# Patient Record
Sex: Male | Born: 1960
Health system: Southern US, Community
[De-identification: ages and names within clinical notes are randomized; demographics above are authoritative.]

## PROBLEM LIST (undated history)

## (undated) DIAGNOSIS — Z8709 Personal history of other diseases of the respiratory system: Secondary | ICD-10-CM

## (undated) DIAGNOSIS — F329 Major depressive disorder, single episode, unspecified: Secondary | ICD-10-CM

## (undated) DIAGNOSIS — Z9889 Other specified postprocedural states: Secondary | ICD-10-CM

## (undated) DIAGNOSIS — M1990 Unspecified osteoarthritis, unspecified site: Secondary | ICD-10-CM

## (undated) DIAGNOSIS — IMO0001 Reserved for inherently not codable concepts without codable children: Secondary | ICD-10-CM

## (undated) DIAGNOSIS — T7840XA Allergy, unspecified, initial encounter: Secondary | ICD-10-CM

## (undated) DIAGNOSIS — M549 Dorsalgia, unspecified: Secondary | ICD-10-CM

## (undated) DIAGNOSIS — K37 Unspecified appendicitis: Secondary | ICD-10-CM

## (undated) DIAGNOSIS — G4730 Sleep apnea, unspecified: Secondary | ICD-10-CM

## (undated) DIAGNOSIS — R112 Nausea with vomiting, unspecified: Secondary | ICD-10-CM

## (undated) DIAGNOSIS — I472 Ventricular tachycardia, unspecified: Secondary | ICD-10-CM

## (undated) DIAGNOSIS — E785 Hyperlipidemia, unspecified: Secondary | ICD-10-CM

## (undated) DIAGNOSIS — G629 Polyneuropathy, unspecified: Secondary | ICD-10-CM

## (undated) DIAGNOSIS — E119 Type 2 diabetes mellitus without complications: Secondary | ICD-10-CM

## (undated) DIAGNOSIS — R519 Headache, unspecified: Secondary | ICD-10-CM

## (undated) DIAGNOSIS — I2510 Atherosclerotic heart disease of native coronary artery without angina pectoris: Secondary | ICD-10-CM

## (undated) DIAGNOSIS — Z95810 Presence of automatic (implantable) cardiac defibrillator: Secondary | ICD-10-CM

## (undated) DIAGNOSIS — Z950 Presence of cardiac pacemaker: Secondary | ICD-10-CM

## (undated) DIAGNOSIS — Z8719 Personal history of other diseases of the digestive system: Secondary | ICD-10-CM

## (undated) DIAGNOSIS — R51 Headache: Secondary | ICD-10-CM

## (undated) DIAGNOSIS — Z860100 Personal history of colon polyps, unspecified: Secondary | ICD-10-CM

## (undated) DIAGNOSIS — F32A Depression, unspecified: Secondary | ICD-10-CM

## (undated) DIAGNOSIS — I509 Heart failure, unspecified: Secondary | ICD-10-CM

## (undated) DIAGNOSIS — I219 Acute myocardial infarction, unspecified: Secondary | ICD-10-CM

## (undated) DIAGNOSIS — I10 Essential (primary) hypertension: Secondary | ICD-10-CM

## (undated) DIAGNOSIS — I429 Cardiomyopathy, unspecified: Secondary | ICD-10-CM

## (undated) DIAGNOSIS — J189 Pneumonia, unspecified organism: Secondary | ICD-10-CM

## (undated) DIAGNOSIS — K219 Gastro-esophageal reflux disease without esophagitis: Secondary | ICD-10-CM

## (undated) DIAGNOSIS — F419 Anxiety disorder, unspecified: Secondary | ICD-10-CM

## (undated) DIAGNOSIS — E669 Obesity, unspecified: Secondary | ICD-10-CM

## (undated) HISTORY — DX: Cardiomyopathy, unspecified: I42.9

## (undated) HISTORY — PX: BACK SURGERY: SHX140

## (undated) HISTORY — DX: Sleep apnea, unspecified: G47.30

## (undated) HISTORY — PX: VASECTOMY: SHX75

## (undated) HISTORY — PX: COLONOSCOPY: SHX174

## (undated) HISTORY — DX: Presence of automatic (implantable) cardiac defibrillator: Z95.810

## (undated) HISTORY — DX: Allergy, unspecified, initial encounter: T78.40XA

## (undated) HISTORY — DX: Major depressive disorder, single episode, unspecified: F32.9

## (undated) HISTORY — PX: KNEE SURGERY: SHX244

## (undated) HISTORY — DX: Gastro-esophageal reflux disease without esophagitis: K21.9

## (undated) HISTORY — DX: Acute myocardial infarction, unspecified: I21.9

## (undated) HISTORY — PX: CARDIAC CATHETERIZATION: SHX172

## (undated) HISTORY — PX: CARDIAC ELECTROPHYSIOLOGY STUDY AND ABLATION: SHX1294

## (undated) HISTORY — PX: ESOPHAGOGASTRODUODENOSCOPY: SHX1529

## (undated) HISTORY — PX: CORONARY ANGIOPLASTY WITH STENT PLACEMENT: SHX49

## (undated) HISTORY — DX: Heart failure, unspecified: I50.9

## (undated) HISTORY — PX: JOINT REPLACEMENT: SHX530

## (undated) HISTORY — PX: EYE SURGERY: SHX253

## (undated) HISTORY — DX: Essential (primary) hypertension: I10

## (undated) HISTORY — DX: Depression, unspecified: F32.A

## (undated) HISTORY — PX: SPINE SURGERY: SHX786

## (undated) HISTORY — DX: Hyperlipidemia, unspecified: E78.5

## (undated) HISTORY — DX: Obesity, unspecified: E66.9

## (undated) HISTORY — DX: Atherosclerotic heart disease of native coronary artery without angina pectoris: I25.10

## (undated) HISTORY — PX: APPENDECTOMY: SHX54

## (undated) HISTORY — DX: Unspecified appendicitis: K37

## (undated) HISTORY — PX: INSERT / REPLACE / REMOVE PACEMAKER: SUR710

## (undated) HISTORY — DX: Type 2 diabetes mellitus without complications: E11.9

## (undated) HISTORY — DX: Ventricular tachycardia: I47.2

## (undated) HISTORY — DX: Ventricular tachycardia, unspecified: I47.20

---

## 1999-09-18 DIAGNOSIS — I219 Acute myocardial infarction, unspecified: Secondary | ICD-10-CM

## 1999-09-18 HISTORY — PX: CHOLECYSTECTOMY: SHX55

## 1999-09-18 HISTORY — DX: Acute myocardial infarction, unspecified: I21.9

## 2007-10-16 HISTORY — PX: CARDIAC DEFIBRILLATOR PLACEMENT: SHX171

## 2013-07-24 LAB — HM COLONOSCOPY

## 2013-08-10 ENCOUNTER — Encounter: Payer: Self-pay | Admitting: Primary Care

## 2014-09-30 DIAGNOSIS — E785 Hyperlipidemia, unspecified: Secondary | ICD-10-CM | POA: Insufficient documentation

## 2014-09-30 DIAGNOSIS — I5022 Chronic systolic (congestive) heart failure: Secondary | ICD-10-CM | POA: Insufficient documentation

## 2015-04-20 ENCOUNTER — Encounter: Payer: Self-pay | Admitting: Cardiovascular Disease

## 2015-04-20 ENCOUNTER — Ambulatory Visit (INDEPENDENT_AMBULATORY_CARE_PROVIDER_SITE_OTHER): Payer: Managed Care, Other (non HMO) | Admitting: Cardiovascular Disease

## 2015-04-20 ENCOUNTER — Encounter (INDEPENDENT_AMBULATORY_CARE_PROVIDER_SITE_OTHER): Payer: Self-pay

## 2015-04-20 VITALS — BP 100/62 | HR 66 | Ht 72.0 in | Wt 289.8 lb

## 2015-04-20 DIAGNOSIS — R079 Chest pain, unspecified: Secondary | ICD-10-CM | POA: Diagnosis not present

## 2015-04-20 DIAGNOSIS — I2510 Atherosclerotic heart disease of native coronary artery without angina pectoris: Secondary | ICD-10-CM

## 2015-04-20 DIAGNOSIS — R0602 Shortness of breath: Secondary | ICD-10-CM

## 2015-04-20 DIAGNOSIS — E1165 Type 2 diabetes mellitus with hyperglycemia: Secondary | ICD-10-CM | POA: Insufficient documentation

## 2015-04-20 DIAGNOSIS — E118 Type 2 diabetes mellitus with unspecified complications: Secondary | ICD-10-CM

## 2015-04-20 DIAGNOSIS — Z95810 Presence of automatic (implantable) cardiac defibrillator: Secondary | ICD-10-CM | POA: Insufficient documentation

## 2015-04-20 DIAGNOSIS — IMO0002 Reserved for concepts with insufficient information to code with codable children: Secondary | ICD-10-CM

## 2015-04-20 DIAGNOSIS — I420 Dilated cardiomyopathy: Secondary | ICD-10-CM | POA: Insufficient documentation

## 2015-04-20 MED ORDER — SACUBITRIL-VALSARTAN 24-26 MG PO TABS: 1.0000 | ORAL_TABLET | Freq: Two times a day (BID) | ORAL | Status: DC

## 2015-04-20 NOTE — Assessment & Plan Note (Signed)
Long discussion today concerning his history. He has fatigue, some shortness of breath. Unclear what is chronic and new. Long history of chronic systolic CHF. Recommended restricting his diet, salt and fluid intake Extra Lasix as above. Ideally needs weight loss, exercise regimen

## 2015-04-20 NOTE — Assessment & Plan Note (Signed)
We will arrange follow-up/new appointment with Dr. Caryl Comes, EP. Establish care History of VT, VT ablation, cardiomyopathy

## 2015-04-20 NOTE — Assessment & Plan Note (Signed)
Poorly controlled diabetes, he has follow-up with Dr. Gabriel Carina.  Suspect dietary indiscretion

## 2015-04-20 NOTE — Progress Notes (Addendum)
Patient ID: Brian Williams, male    DOB: August 24, 1961, 54 y.o.   MRN: [id number]  HPI Comments: Brian Williams is a pleasant 54 year old gentleman with history of coronary artery disease, long history of smoking, diabetes type 2 poorly controlled with last hemoglobin A1c of 9, followed by endocrine/Dr. Gabriel Carina, prior history of cardiac catheterization 12 dating back to 2002, cardiomyopathy, last echocardiogram March 2016 showing ejection fraction 20-25%, who presents to establish care in the [place] office History of sustained VT, VT ablation in [place] at [facility] History of sleep apnea, on CPAP  Previously seen in [place] for his cardiac issues. History dates back to 2002 when he had distal RCA disease. Several catheterizations over the next several years for in-stent restenosis of the distal RCA. Later cath, stent placed to the LAD. Most recent catheterization appears to be April 2011 showing patent LAD and RCA stent.  Despite this, he has had a decline in his ejection fraction over the past several years requiring defibrillator for ventricular tachycardia. Arrhythmia seems to be controlled on nadolol (was previously on 80 mg twice a day, decreased down to daily secondary to low blood pressure and fatigue)  He is moved down from [place] to the [place] area March 2016. Denies having any episodes of arrhythmia or ICD shock. He's only had one shock since the device was placed.   Sounds as if he eats and drinks what he likes, takes standing dose Lasix daily, does not really check his weight for symptoms of heart failure. Does have some leg edema. Chronic shortness of breath when supine which she attributes to sleep apnea  Recently seen by [facility] cardiology heart failure/transplant team, [name]. No medication changes at that time. Creatinine was elevated with BUN, improved on subsequent lab work. Etiology, he is unclear.  Most recent cholesterol per his notes July 2015  showing total cholesterol 185, HDL 19, triglycerides 800, this was on Crestor 40, fenofibrate  EKG on today's visit shows normal sinus rhythm with rate 66 bpm, old inferior MI      Allergies  Allergen Reactions  . Naproxen Other (See Comments)    Hyperactivity     No current outpatient prescriptions on file prior to visit.   No current facility-administered medications on file prior to visit.    Past Medical History  Diagnosis Date  . Coronary artery disease   . Hypertension   . Hyperlipidemia   . CHF (congestive heart failure)   . Diabetes mellitus without complication   . Arrhythmia   . MI (myocardial infarction)   . Sleep apnea   . AICD (automatic cardioverter/defibrillator) present   . Ventricular tachycardia   . ED (erectile dysfunction)     Past Surgical History  Procedure Laterality Date  . Cardiac electrophysiology study and ablation    . Cardiac catheterization    . Coronary angioplasty with stent placement    . Knee surgery      bilateral knee   . Back surgery    . Insert / replace / remove pacemaker    . Cardiac defibrillator placement  10/16/2007    ICD Model number 2207-36 serial number [id number]    Social History  reports that he has been smoking Cigarettes.  He has been smoking about 0.50 packs per day. He does not have any smokeless tobacco history on file. He reports that he does not drink alcohol or use illicit drugs.  Family History family history includes Heart attack (age of onset: 53) in his mother;  Heart attack (age of onset: 42) in his father; Heart disease in his maternal uncle; Hypertension in his father, maternal uncle, and mother.      Review of Systems  Constitutional: Negative.   Respiratory: Positive for shortness of breath.   Cardiovascular: Positive for leg swelling.  Gastrointestinal: Negative.   Musculoskeletal: Negative.   Neurological: Negative.   Hematological: Negative.   Psychiatric/Behavioral: Negative.   All  other systems reviewed and are negative.   BP 100/62 mmHg  Pulse 66  Ht 6' (1.829 m)  Wt 289 lb 12 oz (131.43 kg)  BMI 39.29 kg/m2  Physical Exam  Constitutional: He is oriented to person, place, and time. He appears well-developed and well-nourished.  Obese  HENT:  Head: Normocephalic.  Nose: Nose normal.  Mouth/Throat: Oropharynx is clear and moist.  Eyes: Conjunctivae are normal. Pupils are equal, round, and reactive to light.  Neck: Normal range of motion. Neck supple. No JVD present.  Cardiovascular: Normal rate, regular rhythm, S1 normal, S2 normal, normal heart sounds and intact distal pulses.  Exam reveals no gallop and no friction rub.   No murmur heard. Pulmonary/Chest: Effort normal and breath sounds normal. No respiratory distress. He has no wheezes. He has no rales. He exhibits no tenderness.  Abdominal: Soft. Bowel sounds are normal. He exhibits no distension. There is no tenderness.  Musculoskeletal: Normal range of motion. He exhibits no edema or tenderness.  Lymphadenopathy:    He has no cervical adenopathy.  Neurological: He is alert and oriented to person, place, and time. Coordination normal.  Skin: Skin is warm and dry. No rash noted. No erythema.  Psychiatric: He has a normal mood and affect. His behavior is normal. Judgment and thought content normal.      Assessment and Plan   Nursing note and vitals reviewed.

## 2015-04-20 NOTE — Assessment & Plan Note (Signed)
Suspect secondary to ischemic as well as nonischemic etiology. No recent cardiac catheterization since I suspect 2011.  Recommended daily weights, extra Lasix after lunch for worsening leg edema, shortness of breath, weight gain of 3 pounds We will hold his lisinopril, start entresto 24/26 milligrams by mouth twice a day In one month's will increase the dose if tolerated. He'll monitor blood pressures daily

## 2015-04-20 NOTE — Patient Instructions (Addendum)
You are doing well.  Please hold the lisinopril Please start entresto 24/26 mg twice a day Please monitor your blood pressure After one month, if BP is ok, we will increase the dose up to 49/51 mg twice A day  We will arrange a visit with EP, Dr. Caryl Comes for ICD, h/x VT  Please call us if you have new issues that need to be addressed before your next appt.  Your physician wants you to follow-up in: 1 month

## 2015-04-20 NOTE — Assessment & Plan Note (Signed)
Prior history of stent to the LAD and numerous stents to the distal RCA. Does not feel that he needs workup for ischemia at this time. Recommended he call us for any worsening shortness of breath, arm pain, chest pain

## 2015-04-27 ENCOUNTER — Encounter: Payer: Self-pay | Admitting: Cardiovascular Disease

## 2015-05-02 ENCOUNTER — Encounter: Payer: Self-pay | Admitting: Primary Care

## 2015-05-02 ENCOUNTER — Ambulatory Visit (INDEPENDENT_AMBULATORY_CARE_PROVIDER_SITE_OTHER): Payer: Managed Care, Other (non HMO) | Admitting: Primary Care

## 2015-05-02 VITALS — BP 108/64 | HR 68 | Temp 98.7°F | Ht 72.0 in | Wt 289.0 lb

## 2015-05-02 DIAGNOSIS — E118 Type 2 diabetes mellitus with unspecified complications: Secondary | ICD-10-CM | POA: Diagnosis not present

## 2015-05-02 DIAGNOSIS — F411 Generalized anxiety disorder: Secondary | ICD-10-CM

## 2015-05-02 DIAGNOSIS — G8929 Other chronic pain: Secondary | ICD-10-CM | POA: Insufficient documentation

## 2015-05-02 DIAGNOSIS — I10 Essential (primary) hypertension: Secondary | ICD-10-CM | POA: Diagnosis not present

## 2015-05-02 DIAGNOSIS — Z95810 Presence of automatic (implantable) cardiac defibrillator: Secondary | ICD-10-CM | POA: Diagnosis not present

## 2015-05-02 DIAGNOSIS — M549 Dorsalgia, unspecified: Secondary | ICD-10-CM | POA: Diagnosis not present

## 2015-05-02 DIAGNOSIS — E785 Hyperlipidemia, unspecified: Secondary | ICD-10-CM | POA: Insufficient documentation

## 2015-05-02 DIAGNOSIS — IMO0002 Reserved for concepts with insufficient information to code with codable children: Secondary | ICD-10-CM

## 2015-05-02 DIAGNOSIS — E1165 Type 2 diabetes mellitus with hyperglycemia: Secondary | ICD-10-CM

## 2015-05-02 MED ORDER — CYCLOBENZAPRINE HCL 5 MG PO TABS: 5.0000 mg | ORAL_TABLET | Freq: Three times a day (TID) | ORAL | Status: DC | PRN

## 2015-05-02 NOTE — Assessment & Plan Note (Signed)
Appointment scheduled with EP lab soon. Denies firing of defibrillator.

## 2015-05-02 NOTE — Assessment & Plan Note (Signed)
Managed on Zoloft 100 every other day. Feels well managed. Will continue to monitor.

## 2015-05-02 NOTE — Assessment & Plan Note (Signed)
Endorses recent lipid panel drawn by endocrinologist and cardiologist. Managed on tricor, gemfibrozil, crestor by cardiology in [place]. Will continue to monitor.

## 2015-05-02 NOTE — Patient Instructions (Signed)
Please schedule a physical with me in the next 3 months. You will also schedule a lab only appointment one week prior. We will discuss your lab results during your physical.  It was a pleasure to meet you today! Please don't hesitate to call me with any questions. Welcome to [facility]!

## 2015-05-02 NOTE — Assessment & Plan Note (Signed)
Managed on entresto, nadolol, spironolactone. Stable today. Will continue to monitor along side of cardiology.

## 2015-05-02 NOTE — Progress Notes (Signed)
Pre visit review using our clinic review tool, if applicable. No additional management support is needed unless otherwise documented below in the visit note. 

## 2015-05-02 NOTE — Assessment & Plan Note (Signed)
A1C of 9 per patient. Following with Dr. Gabriel Carina. Will work along side to encourage healthy diet and exercise.

## 2015-05-02 NOTE — Progress Notes (Signed)
Subjective:    Patient ID: Brian Williams, male    DOB: 01-08-1961, 54 y.o.   MRN: [id number]  HPI  Brian Williams is a 54 year old male who presents today to establish care and discuss the problems mentioned below. Will obtain old records.  1) Diabetes Type 2: Managed on Humulin R 500, Metformin, Farxiga. He follows with Dr. Gabriel Carina with [facility]. Last A1C was 9. Next appointment with Dr. Gabriel Carina is in 2 months.   2) Generalized Anxiety Disorder: Present for 3 years. Managed on Zoloft 50 mg and takes 100 mg every other day. Overall he feels well managed. Denies SI/HI.  3) Hyperlipidemia: Managed on tricor, gemfibrozil, Crestor and was placed on this regimen from prior cardiologist. Lipid panel drawn by cardiologist and endocrinologist 2 months ago per patient.  4) CAD: Myocardial infarction in 2001. 6-7 stents total. Follows with Dr. Rockey Situ with cardiology. His next appointment with Dr. Rockey Situ is in 2 months. He also has a Paramedic device that has been present since January of 2009. He has an upcoming appointment with Dr. Caryl Comes for evaluation. He is hoping to have a cardiac transplant one day and is currently in the process.   5) Essential Hypertension: Currently managed on nadolol, entresto, and spironolactone. Denies headaches, chest pain, shortness of breath.   Review of Systems  Constitutional: Negative for unexpected weight change.  HENT: Negative for rhinorrhea.   Respiratory: Negative for cough and shortness of breath.   Cardiovascular: Negative for chest pain.  Gastrointestinal: Negative for diarrhea and constipation.  Genitourinary: Negative for difficulty urinating.  Musculoskeletal:       Chronic knee and back pain. Is on disability.  Skin: Negative for rash.  Neurological: Negative for dizziness and headaches.       Numbness to right foot.  Psychiatric/Behavioral:       See HPI       Past Medical History  Diagnosis Date  . Coronary artery disease   .  Hypertension   . Hyperlipidemia   . CHF (congestive heart failure)   . Diabetes mellitus without complication   . Arrhythmia   . MI (myocardial infarction)   . Sleep apnea   . AICD (automatic cardioverter/defibrillator) present   . Ventricular tachycardia   . ED (erectile dysfunction)     Social History   Social History  . Marital Status: Married    Spouse Name: N/A  . Number of Children: N/A  . Years of Education: N/A   Occupational History  . Not on file.   Social History Main Topics  . Smoking status: Current Every Day Smoker -- 0.50 packs/day    Types: Cigarettes  . Smokeless tobacco: Not on file  . Alcohol Use: No  . Drug Use: No  . Sexual Activity: Not on file   Other Topics Concern  . Not on file   Social History Narrative   Married.   Moved from [place].   Disabled.       Past Surgical History  Procedure Laterality Date  . Cardiac electrophysiology study and ablation    . Cardiac catheterization    . Coronary angioplasty with stent placement    . Knee surgery      bilateral knee   . Back surgery    . Insert / replace / remove pacemaker    . Cardiac defibrillator placement  10/16/2007    ICD Model number 2207-36 serial number [id number]    Family History  Problem Relation Age of Onset  .  Heart attack Mother 67  . Hypertension Mother   . Heart attack Father 4  . Hypertension Father   . Hypertension Maternal Uncle   . Heart disease Maternal Uncle     Allergies  Allergen Reactions  . Naproxen Other (See Comments)    Hyperactivity     Current Outpatient Prescriptions on File Prior to Visit  Medication Sig Dispense Refill  . ALPRAZolam (XANAX) 0.5 MG tablet Take 0.5 mg by mouth at bedtime as needed.     Marland Kitchen amiodarone (PACERONE) 200 MG tablet Take 200 mg by mouth daily.     Marland Kitchen aspirin 81 MG chewable tablet Chew 81 mg by mouth daily.     . Cholecalciferol (D 2000) 2000 UNITS TABS Take 2,000 Units by mouth daily.     . clopidogrel (PLAVIX) 75 MG  tablet Take 75 mg by mouth daily.     . dapagliflozin propanediol (FARXIGA) 10 MG TABS tablet Take 10 mg by mouth daily.     . fenofibrate (TRICOR) 145 MG tablet Take 145 mg by mouth daily.     . furosemide (LASIX) 40 MG tablet Take 40 mg by mouth daily.     Marland Kitchen gemfibrozil (LOPID) 600 MG tablet Take 600 mg by mouth daily.     . insulin regular human CONCENTRATED (HUMULIN R) 500 UNIT/ML injection Inject 500 Units as directed once daily. Via pump up to 3 ml daily    . metFORMIN (GLUCOPHAGE-XR) 500 MG 24 hr tablet Take 1,000 mg by mouth daily with breakfast.     . nadolol (CORGARD) 80 MG tablet Take 80 mg by mouth daily.     . nitroGLYCERIN (NITROSTAT) 0.4 MG SL tablet Place 0.4 mg under the tongue every 5 (five) minutes as needed.     . potassium chloride (KLOR-CON M10) 10 MEQ tablet Take 10 mEq by mouth daily.     . rosuvastatin (CRESTOR) 40 MG tablet Take 40 mg by mouth daily.     . sacubitril-valsartan (ENTRESTO) 24-26 MG Take 1 tablet by mouth 2 (two) times daily. 60 tablet 11  . sertraline (ZOLOFT) 50 MG tablet Take 50 mg by mouth daily.     . sildenafil (VIAGRA) 100 MG tablet Take 100 mg by mouth as needed.     Marland Kitchen spironolactone (ALDACTONE) 25 MG tablet Take by mouth daily.     . Vitamin D, Ergocalciferol, (DRISDOL) 50000 UNITS CAPS capsule Take 50,000 Units by mouth 2 (two) times a week.      No current facility-administered medications on file prior to visit.    BP 108/64 mmHg  Pulse 68  Temp(Src) 98.7 F (37.1 C) (Oral)  Ht 6' (1.829 m)  Wt 289 lb (131.09 kg)  BMI 39.19 kg/m2  SpO2 97%    Objective:   Physical Exam  Constitutional: He is oriented to person, place, and time. He appears well-nourished.  Cardiovascular: Normal rate and regular rhythm.   Pulmonary/Chest: Effort normal and breath sounds normal.  Neurological: He is alert and oriented to person, place, and time.  Skin: Skin is warm and dry.  Psychiatric: He has a normal mood and affect.          Assessment  & Plan:

## 2015-05-02 NOTE — Assessment & Plan Note (Signed)
Present since injuries sustained from years ago. Once taking daily ibuprofen, but no longer due to cardiac history. Will prescribe PRN Flexeril for spasms today. Will continue to follow.

## 2015-05-12 ENCOUNTER — Telehealth: Payer: Self-pay

## 2015-05-12 NOTE — Telephone Encounter (Signed)
Received "My Chart" message that pt had not read 04/28/15 reply that Entresto samples were at front desk. Called pt who indicates he never saw the message. States he filled prescription and has been taking Entresto. States he will stop by tomorrow for the samples. Pt expressed appreciation for the call and the samples and had no further questions.

## 2015-05-22 NOTE — Progress Notes (Signed)
ELECTROPHYSIOLOGY CONSULT NOTE  Patient ID: Brian Williams, MRN: [id number], DOB/AGE: 1961-07-25 54 y.o. Admit date: (Not on file) Date of Consult: 05/24/2015  Primary Physician: Sheral Flow, NP Primary Cardiologist:TG Chief Complaint: ICD to establish   HPI Brian Williams is a 54 y.o. male  Seen to establish ICD follow-up. He has a history of a prior shock presumed appropriate. The device was implanted after a monitor undertaken for palpitations presumably demonstrated sustained ventricular tachycardia. At that juncture he was also put on amiodarone and was told "you will be on it the rest of your life".  He also has a history of VT s/p PVC ablation at [facility] amd followed there by HF service  Has CAD with prior multiple stenting, Most recent catheterization appears to be April 2011 showing patent LAD and RCA stent. EF 25% myoview 7/15  VT managed with nadolol with recent decrease in dose to 80 daily; recently transitioned lisinopril >>entresto   He has modest dyspnea on exertion and occasional peripheral edema. He has not had recurrent syncope or recent ICD discharge. Palpitations have been largely quiescent.   Recent labs  8/16 Cr 1.1 Past Medical History  Diagnosis Date  . Coronary artery disease   . Hypertension   . Hyperlipidemia   . CHF (congestive heart failure)   . Diabetes mellitus without complication   . Arrhythmia   . MI (myocardial infarction)   . Sleep apnea   . AICD (automatic cardioverter/defibrillator) present   . Ventricular tachycardia   . ED (erectile dysfunction)       Surgical History:  Past Surgical History  Procedure Laterality Date  . Cardiac electrophysiology study and ablation    . Cardiac catheterization    . Coronary angioplasty with stent placement    . Knee surgery      bilateral knee   . Back surgery    . Cardiac defibrillator placement  10/16/2007    ICD Model number 2207-36 serial number [id number]  . Insert / replace / remove  pacemaker       Home Meds: Prior to Admission medications   Medication Sig Start Date End Date Taking? Authorizing Provider  ALPRAZolam Duanne Moron) 0.5 MG tablet Take 0.5 mg by mouth at bedtime as needed.  06/07/14   Historical Provider, MD  amiodarone (PACERONE) 200 MG tablet Take 200 mg by mouth daily.  09/13/14   Historical Provider, MD  aspirin 81 MG chewable tablet Chew 81 mg by mouth daily.     Historical Provider, MD  Cholecalciferol (D 2000) 2000 UNITS TABS Take 2,000 Units by mouth daily.     Historical Provider, MD  clopidogrel (PLAVIX) 75 MG tablet Take 75 mg by mouth daily.  07/22/14   Historical Provider, MD  cyclobenzaprine (FLEXERIL) 5 MG tablet Take 1 tablet (5 mg total) by mouth 3 (three) times daily as needed for muscle spasms. 05/02/15   Pleas Koch, NP  dapagliflozin propanediol (FARXIGA) 10 MG TABS tablet Take 10 mg by mouth daily.  04/18/15   Historical Provider, MD  fenofibrate (TRICOR) 145 MG tablet Take 145 mg by mouth daily.  08/12/14   Historical Provider, MD  furosemide (LASIX) 40 MG tablet Take 40 mg by mouth daily.  09/13/14   Historical Provider, MD  gemfibrozil (LOPID) 600 MG tablet Take 600 mg by mouth daily.  09/13/14   Historical Provider, MD  insulin regular human CONCENTRATED (HUMULIN R) 500 UNIT/ML injection Inject 500 Units as directed once daily. Via pump up to 3 ml  daily 12/02/14   Historical Provider, MD  metFORMIN (GLUCOPHAGE-XR) 500 MG 24 hr tablet Take 1,000 mg by mouth daily with breakfast.  04/18/15 07/17/15  Historical Provider, MD  nadolol (CORGARD) 80 MG tablet Take 80 mg by mouth daily.     Historical Provider, MD  nitroGLYCERIN (NITROSTAT) 0.4 MG SL tablet Place 0.4 mg under the tongue every 5 (five) minutes as needed.     Historical Provider, MD  potassium chloride (KLOR-CON M10) 10 MEQ tablet Take 10 mEq by mouth daily.  09/02/14   Historical Provider, MD  rosuvastatin (CRESTOR) 40 MG tablet Take 40 mg by mouth daily.  09/13/14   Historical  Provider, MD  sacubitril-valsartan (ENTRESTO) 24-26 MG Take 1 tablet by mouth 2 (two) times daily. 04/20/15   Minna Merritts, MD  sertraline (ZOLOFT) 50 MG tablet Take 50 mg by mouth daily.     Historical Provider, MD  sildenafil (VIAGRA) 100 MG tablet Take 100 mg by mouth as needed.     Historical Provider, MD  spironolactone (ALDACTONE) 25 MG tablet Take by mouth daily.  10/25/14 10/25/15  Historical Provider, MD  Vitamin D, Ergocalciferol, (DRISDOL) 50000 UNITS CAPS capsule Take 50,000 Units by mouth 2 (two) times a week.  07/26/14   Historical Provider, MD       Allergies:  Allergies  Allergen Reactions  . Naproxen Other (See Comments)    Hyperactivity     Social History   Social History  . Marital Status: Married    Spouse Name: N/A  . Number of Children: N/A  . Years of Education: N/A   Occupational History  . Not on file.   Social History Main Topics  . Smoking status: Current Every Day Smoker -- 0.50 packs/day    Types: Cigarettes  . Smokeless tobacco: Not on file  . Alcohol Use: No  . Drug Use: No  . Sexual Activity: Not on file   Other Topics Concern  . Not on file   Social History Narrative   Married.   Moved from [place].   Disabled.        Family History  Problem Relation Age of Onset  . Heart attack Mother 73  . Hypertension Mother   . Heart attack Father 29  . Hypertension Father   . Hypertension Maternal Uncle   . Heart disease Maternal Uncle      ROS:  Please see the history of present illness.     All other systems reviewed and negative.    Physical Exam: Blood pressure 100/54, pulse 73, height 6' (1.829 m), weight 286 lb (129.729 kg). General: Well developed, well nourished male in no acute distress. Head: Normocephalic, atraumatic, sclera non-icteric, no xanthomas, nares are without discharge. EENT: normal Lymph Nodes:  none Back: without scoliosis/kyphosis , no CVA tendersness Neck: Negative for carotid bruits. JVD not  elevated. Lungs: Clear bilaterally to auscultation without wheezes, rales, or rhonchi. Breathing is unlabored.  Device pocket well healed; without hematoma or erythema.  There is no tethering  Heart: RRR with S1 S2. No  murmur , rubs, or gallops appreciated. Abdomen: Soft, non-tender, non-distended with normoactive bowel sounds. No hepatomegaly. No rebound/guarding. No obvious abdominal masses. Msk:  Strength and tone appear normal for age. Extremities: No clubbing or cyanosis.  tr edema.  Distal pedal pulses are 2+ and equal bilaterally. Skin: Warm and Dry Neuro: Alert and oriented X 3. CN III-XII intact Grossly normal sensory and motor function . Psych:  Responds to questions appropriately with  a normal affect.      Labs: Cardiac Enzymes No results for input(s): CKTOTAL, CKMB, TROPONINI in the last 72 hours. CBC No results found for: WBC, HGB, HCT, MCV, PLT PROTIME: No results for input(s): LABPROT, INR in the last 72 hours. Chemistry No results for input(s): NA, K, CL, CO2, BUN, CREATININE, CALCIUM, PROT, BILITOT, ALKPHOS, ALT, AST, GLUCOSE in the last 168 hours.  Invalid input(s): LABALBU Lipids No results found for: CHOL, HDL, LDLCALC, TRIG BNP No results found for: PROBNP Thyroid Function Tests: No results for input(s): TSH, T4TOTAL, T3FREE, THYROIDAB in the last 72 hours.  Invalid input(s): FREET3    Miscellaneous No results found for: DDIMER  Radiology/Studies:  No results found.  EKG: Sinus rhythm at 66 Intervals 18/11/40  Outside records from [place] were reviewed.  Assessment and Plan:  Ischemic cardiomyopathy  Congestive heart failure-chronic-systolic class II  Ventricular tachycardia  ICD-St. Jude  Morbidly obese    The patient's ICD has reached ERI. We have reviewed the benefits and risks of generator replacement.  These include but are not limited to lead fracture and infection.  The patient understands, agrees and is willing to proceed.    We  have also reviewed his medications. I will defer to the heart failure team as to whether he should be on nadolol or carvedilol. He is tolerating his Entresto. We'll check his metabolic profile on this new medication regime.  We will check his TSH and his LFTs at the time of preoperative assessment for surveillance of his amiodarone.  I will work on trying to get records from his primary cardiologist    Virl Axe

## 2015-05-24 ENCOUNTER — Ambulatory Visit (INDEPENDENT_AMBULATORY_CARE_PROVIDER_SITE_OTHER): Payer: Managed Care, Other (non HMO) | Admitting: Internal Medicine

## 2015-05-24 ENCOUNTER — Encounter: Payer: Self-pay | Admitting: Internal Medicine

## 2015-05-24 VITALS — BP 100/54 | HR 73 | Ht 72.0 in | Wt 286.0 lb

## 2015-05-24 DIAGNOSIS — Z01812 Encounter for preprocedural laboratory examination: Secondary | ICD-10-CM

## 2015-05-24 NOTE — Patient Instructions (Addendum)
Medication Instructions:  Your physician recommends that you continue on your current medications as directed. Please refer to the Current Medication list given to you today.   Labwork: Your physician recommends that you return for lab work Sept 16: CBC, BMET, PT/INR, amiodarone, TSH, LFT   Testing/Procedures: Generator replacement at [facility]           [place]              Thursday, June 09, 2015 at 12:00 Arrival time: 10:00am  Nothing to eat or drink after midnight the evening before your procedure. Do not take insulin or metformin the morning of your procedure. Hold spironolactone and lasix the morning of your procedure  Follow-Up: Your physician recommends that you schedule a follow-up appointment after generator replacement. Wound check 10-14 days after Sept 22 procedure.    Any Other Special Instructions Will Be Listed Below (If Applicable).

## 2015-05-27 ENCOUNTER — Encounter: Payer: Self-pay | Admitting: Cardiovascular Disease

## 2015-05-30 ENCOUNTER — Telehealth: Payer: Self-pay | Admitting: Internal Medicine

## 2015-05-30 NOTE — Telephone Encounter (Signed)
Brian Williams  (need to reschedule gen change.  Pt is currently scheduled on 9/22 and Caryl Comes is not in hospital that day)

## 2015-05-30 NOTE — Telephone Encounter (Signed)
Follow up   Pt states he is returning rn call

## 2015-05-30 NOTE — Telephone Encounter (Signed)
New problem    Pt stated he is returning a call from nurse. Please call pt.

## 2015-05-30 NOTE — Telephone Encounter (Addendum)
Rescheduled gen change to 9/29. Patient to arrive at hospital at 12:30 for a 2:30 pm procedure.  Rescheduled wound check also - Moved to 06/30/15. Patient verbalized understanding and agreeable to plan.

## 2015-05-31 ENCOUNTER — Encounter: Payer: Self-pay | Admitting: Cardiovascular Disease

## 2015-06-03 ENCOUNTER — Ambulatory Visit: Payer: Managed Care, Other (non HMO) | Admitting: Cardiovascular Disease

## 2015-06-03 ENCOUNTER — Other Ambulatory Visit (INDEPENDENT_AMBULATORY_CARE_PROVIDER_SITE_OTHER): Payer: Managed Care, Other (non HMO) | Admitting: *Deleted

## 2015-06-03 DIAGNOSIS — Z01812 Encounter for preprocedural laboratory examination: Secondary | ICD-10-CM | POA: Diagnosis not present

## 2015-06-04 LAB — BASIC METABOLIC PANEL
BUN/Creatinine Ratio: 17 (ref 9–20)
BUN: 15 mg/dL (ref 6–24)
CO2: 18 mmol/L (ref 18–29)
Calcium: 10 mg/dL (ref 8.7–10.2)
Chloride: 98 mmol/L (ref 97–108)
Creatinine, Ser: 0.9 mg/dL (ref 0.76–1.27)
GFR calc Af Amer: 112 mL/min/{1.73_m2} (ref >59)
GFR calc non Af Amer: 96 mL/min/{1.73_m2} (ref >59)
Glucose: 143 mg/dL — ABNORMAL HIGH (ref 65–99)
Potassium: 4.3 mmol/L (ref 3.5–5.2)
Sodium: 139 mmol/L (ref 134–144)

## 2015-06-04 LAB — CBC
Hematocrit: 36.7 % — ABNORMAL LOW (ref 37.5–51.0)
Hemoglobin: 12.3 g/dL — ABNORMAL LOW (ref 12.6–17.7)
MCH: 30.8 pg (ref 26.6–33.0)
MCHC: 33.5 g/dL (ref 31.5–35.7)
MCV: 92 fL (ref 79–97)
Platelets: 202 10*3/uL (ref 150–379)
RBC: 3.99 x10E6/uL — ABNORMAL LOW (ref 4.14–5.80)
RDW: 14.3 % (ref 12.3–15.4)
WBC: 8.1 10*3/uL (ref 3.4–10.8)

## 2015-06-04 LAB — TSH: TSH: 3.21 u[IU]/mL (ref 0.450–4.500)

## 2015-06-04 LAB — HEPATIC FUNCTION PANEL
ALT: 24 IU/L (ref 0–44)
AST: 24 IU/L (ref 0–40)
Albumin: 4.5 g/dL (ref 3.5–5.5)
Alkaline Phosphatase: 49 IU/L (ref 39–117)
Bilirubin Total: 0.3 mg/dL (ref 0.0–1.2)
Bilirubin, Direct: 0.1 mg/dL (ref 0.00–0.40)
Total Protein: 7 g/dL (ref 6.0–8.5)

## 2015-06-04 LAB — PROTIME-INR
INR: 1 (ref 0.8–1.2)
Prothrombin Time: 10.6 s (ref 9.1–12.0)

## 2015-06-07 ENCOUNTER — Other Ambulatory Visit: Payer: Self-pay

## 2015-06-07 LAB — AMIODARONE LEVEL: Amiodarone, Serum: 3.6 ug/mL — ABNORMAL HIGH (ref 1.0–2.5)

## 2015-06-07 MED ORDER — SACUBITRIL-VALSARTAN 49-51 MG PO TABS: 1.0000 | ORAL_TABLET | Freq: Two times a day (BID) | ORAL | Status: DC

## 2015-06-15 ENCOUNTER — Encounter: Payer: Self-pay | Admitting: Internal Medicine

## 2015-06-16 ENCOUNTER — Encounter: Payer: Self-pay | Admitting: Cardiology

## 2015-06-16 ENCOUNTER — Ambulatory Visit (HOSPITAL_COMMUNITY)
Admission: RE | Admit: 2015-06-16 | Discharge: 2015-06-16 | Disposition: A | Discharge status: Home / Self Care | Payer: Managed Care, Other (non HMO) | Source: Ambulatory Visit | Attending: Internal Medicine | Admitting: Internal Medicine

## 2015-06-16 ENCOUNTER — Encounter (HOSPITAL_COMMUNITY)
Admission: RE | Disposition: A | Discharge status: Home / Self Care | Payer: Managed Care, Other (non HMO) | Source: Ambulatory Visit | Attending: Internal Medicine

## 2015-06-16 DIAGNOSIS — I472 Ventricular tachycardia: Secondary | ICD-10-CM | POA: Diagnosis not present

## 2015-06-16 DIAGNOSIS — Z79899 Other long term (current) drug therapy: Secondary | ICD-10-CM | POA: Insufficient documentation

## 2015-06-16 DIAGNOSIS — I5022 Chronic systolic (congestive) heart failure: Secondary | ICD-10-CM | POA: Insufficient documentation

## 2015-06-16 DIAGNOSIS — I252 Old myocardial infarction: Secondary | ICD-10-CM | POA: Insufficient documentation

## 2015-06-16 DIAGNOSIS — E785 Hyperlipidemia, unspecified: Secondary | ICD-10-CM | POA: Diagnosis not present

## 2015-06-16 DIAGNOSIS — Z955 Presence of coronary angioplasty implant and graft: Secondary | ICD-10-CM | POA: Diagnosis not present

## 2015-06-16 DIAGNOSIS — Z7982 Long term (current) use of aspirin: Secondary | ICD-10-CM | POA: Insufficient documentation

## 2015-06-16 DIAGNOSIS — I10 Essential (primary) hypertension: Secondary | ICD-10-CM | POA: Diagnosis not present

## 2015-06-16 DIAGNOSIS — Z4502 Encounter for adjustment and management of automatic implantable cardiac defibrillator: Secondary | ICD-10-CM | POA: Diagnosis not present

## 2015-06-16 DIAGNOSIS — F17210 Nicotine dependence, cigarettes, uncomplicated: Secondary | ICD-10-CM | POA: Insufficient documentation

## 2015-06-16 DIAGNOSIS — I255 Ischemic cardiomyopathy: Secondary | ICD-10-CM | POA: Diagnosis not present

## 2015-06-16 DIAGNOSIS — Z6837 Body mass index (BMI) 37.0-37.9, adult: Secondary | ICD-10-CM | POA: Insufficient documentation

## 2015-06-16 DIAGNOSIS — E119 Type 2 diabetes mellitus without complications: Secondary | ICD-10-CM | POA: Diagnosis not present

## 2015-06-16 DIAGNOSIS — Z95810 Presence of automatic (implantable) cardiac defibrillator: Secondary | ICD-10-CM | POA: Diagnosis not present

## 2015-06-16 DIAGNOSIS — I420 Dilated cardiomyopathy: Secondary | ICD-10-CM | POA: Diagnosis present

## 2015-06-16 DIAGNOSIS — I2510 Atherosclerotic heart disease of native coronary artery without angina pectoris: Secondary | ICD-10-CM | POA: Insufficient documentation

## 2015-06-16 HISTORY — PX: EP IMPLANTABLE DEVICE: SHX172B

## 2015-06-16 LAB — SURGICAL PCR SCREEN
MRSA, PCR: NEGATIVE
Staphylococcus aureus: NEGATIVE

## 2015-06-16 LAB — GLUCOSE, CAPILLARY: Glucose-Capillary: 140 mg/dL — ABNORMAL HIGH (ref 65–99)

## 2015-06-16 SURGERY — ICD/BIV ICD GENERATOR CHANGEOUT
Anesthesia: LOCAL

## 2015-06-16 MED ORDER — ACETAMINOPHEN 325 MG PO TABS
325.0000 mg | ORAL_TABLET | ORAL | Status: DC | PRN
  Filled 2015-06-16: qty 2

## 2015-06-16 MED ORDER — FENTANYL CITRATE (PF) 100 MCG/2ML IJ SOLN
INTRAMUSCULAR | Status: AC
  Filled 2015-06-16: qty 4

## 2015-06-16 MED ORDER — SODIUM CHLORIDE 0.9 % IV SOLN
INTRAVENOUS | Status: DC
  Administered 2015-06-16: 14:00:00 via INTRAVENOUS

## 2015-06-16 MED ORDER — CHLORHEXIDINE GLUCONATE 4 % EX LIQD: 60.0000 mL | Freq: Once | CUTANEOUS | Status: DC

## 2015-06-16 MED ORDER — SODIUM CHLORIDE 0.9 % IR SOLN: 80.0000 mg | Status: DC

## 2015-06-16 MED ORDER — CEFAZOLIN SODIUM-DEXTROSE 2-3 GM-% IV SOLR: 2.0000 g | INTRAVENOUS | Status: DC

## 2015-06-16 MED ORDER — SODIUM CHLORIDE 0.9 % IR SOLN
Status: AC
  Filled 2015-06-16: qty 2

## 2015-06-16 MED ORDER — FENTANYL CITRATE (PF) 100 MCG/2ML IJ SOLN
INTRAMUSCULAR | Status: DC | PRN
  Administered 2015-06-16: 50 ug via INTRAVENOUS
  Administered 2015-06-16 (×3): 25 ug via INTRAVENOUS
  Administered 2015-06-16: 50 ug via INTRAVENOUS

## 2015-06-16 MED ORDER — CEFAZOLIN SODIUM-DEXTROSE 2-3 GM-% IV SOLR
INTRAVENOUS | Status: AC
  Filled 2015-06-16: qty 50

## 2015-06-16 MED ORDER — LIDOCAINE HCL (PF) 1 % IJ SOLN
INTRAMUSCULAR | Status: AC
  Filled 2015-06-16: qty 60

## 2015-06-16 MED ORDER — MUPIROCIN 2 % EX OINT
1.0000 "application " | TOPICAL_OINTMENT | Freq: Once | CUTANEOUS | Status: AC
  Administered 2015-06-16: 1 via TOPICAL
  Filled 2015-06-16: qty 22

## 2015-06-16 MED ORDER — CEFAZOLIN SODIUM-DEXTROSE 2-3 GM-% IV SOLR
INTRAVENOUS | Status: DC | PRN
  Administered 2015-06-16: 2 g via INTRAVENOUS

## 2015-06-16 MED ORDER — MUPIROCIN 2 % EX OINT
TOPICAL_OINTMENT | CUTANEOUS | Status: AC
  Filled 2015-06-16: qty 22

## 2015-06-16 MED ORDER — CEFAZOLIN SODIUM 1-5 GM-% IV SOLN
INTRAVENOUS | Status: DC | PRN
  Administered 2015-06-16: 1 g via INTRAVENOUS

## 2015-06-16 MED ORDER — OXYCODONE-ACETAMINOPHEN 5-325 MG PO TABS
ORAL_TABLET | ORAL | Status: AC
  Filled 2015-06-16: qty 2

## 2015-06-16 MED ORDER — SODIUM CHLORIDE 0.9 % IV SOLN: INTRAVENOUS | Status: DC

## 2015-06-16 MED ORDER — LIDOCAINE HCL (PF) 1 % IJ SOLN
INTRAMUSCULAR | Status: DC | PRN
  Administered 2015-06-16: 40 mL

## 2015-06-16 MED ORDER — MIDAZOLAM HCL 5 MG/5ML IJ SOLN
INTRAMUSCULAR | Status: DC | PRN
  Administered 2015-06-16 (×2): 2 mg via INTRAVENOUS
  Administered 2015-06-16 (×2): 1 mg via INTRAVENOUS

## 2015-06-16 MED ORDER — ONDANSETRON HCL 4 MG/2ML IJ SOLN: 4.0000 mg | Freq: Four times a day (QID) | INTRAMUSCULAR | Status: DC | PRN

## 2015-06-16 MED ORDER — MIDAZOLAM HCL 5 MG/5ML IJ SOLN
INTRAMUSCULAR | Status: AC
  Filled 2015-06-16: qty 25

## 2015-06-16 MED ORDER — HEPARIN (PORCINE) IN NACL 2-0.9 UNIT/ML-% IJ SOLN
INTRAMUSCULAR | Status: AC
  Filled 2015-06-16: qty 1000

## 2015-06-16 MED ORDER — OXYCODONE-ACETAMINOPHEN 5-325 MG PO TABS
2.0000 | ORAL_TABLET | Freq: Once | ORAL | Status: AC
  Administered 2015-06-16: 2 via ORAL

## 2015-06-16 MED ORDER — CEFAZOLIN SODIUM 1-5 GM-% IV SOLN
INTRAVENOUS | Status: AC
  Filled 2015-06-16: qty 50

## 2015-06-16 SURGICAL SUPPLY — 5 items
CABLE SURGICAL S-101-97-12 (CABLE) ×2 IMPLANT
HEMOSTAT SURGICEL 2X4 FIBR (HEMOSTASIS) ×2 IMPLANT
ICD ELLIPSE DR CD2411-36C (ICD Generator) ×2 IMPLANT
PAD DEFIB LIFELINK (PAD) ×2 IMPLANT
TRAY PACEMAKER INSERTION (CUSTOM PROCEDURE TRAY) ×2 IMPLANT

## 2015-06-16 NOTE — H&P (View-Only) (Signed)
ELECTROPHYSIOLOGY CONSULT NOTE  Patient ID: Brian Williams, MRN: [id number], DOB/AGE: 23-Oct-1960 54 y.o. Admit date: (Not on file) Date of Consult: 05/24/2015  Primary Physician: Sheral Flow, NP Primary Cardiologist:TG Chief Complaint: ICD to establish   HPI Brian Williams is a 54 y.o. male  Seen to establish ICD follow-up. He has a history of a prior shock presumed appropriate. The device was implanted after a monitor undertaken for palpitations presumably demonstrated sustained ventricular tachycardia. At that juncture he was also put on amiodarone and was told "you will be on it the rest of your life".  He also has a history of VT s/p PVC ablation at [facility] amd followed there by HF service  Has CAD with prior multiple stenting, Most recent catheterization appears to be April 2011 showing patent LAD and RCA stent. EF 25% myoview 7/15  VT managed with nadolol with recent decrease in dose to 80 daily; recently transitioned lisinopril >>entresto   He has modest dyspnea on exertion and occasional peripheral edema. He has not had recurrent syncope or recent ICD discharge. Palpitations have been largely quiescent.   Recent labs  8/16 Cr 1.1 Past Medical History  Diagnosis Date  . Coronary artery disease   . Hypertension   . Hyperlipidemia   . CHF (congestive heart failure)   . Diabetes mellitus without complication   . Arrhythmia   . MI (myocardial infarction)   . Sleep apnea   . AICD (automatic cardioverter/defibrillator) present   . Ventricular tachycardia   . ED (erectile dysfunction)       Surgical History:  Past Surgical History  Procedure Laterality Date  . Cardiac electrophysiology study and ablation    . Cardiac catheterization    . Coronary angioplasty with stent placement    . Knee surgery      bilateral knee   . Back surgery    . Cardiac defibrillator placement  10/16/2007    ICD Model number 2207-36 serial number [id number]  . Insert / replace / remove  pacemaker       Home Meds: Prior to Admission medications   Medication Sig Start Date End Date Taking? Authorizing Provider  ALPRAZolam Duanne Moron) 0.5 MG tablet Take 0.5 mg by mouth at bedtime as needed.  06/07/14   Historical Provider, MD  amiodarone (PACERONE) 200 MG tablet Take 200 mg by mouth daily.  09/13/14   Historical Provider, MD  aspirin 81 MG chewable tablet Chew 81 mg by mouth daily.     Historical Provider, MD  Cholecalciferol (D 2000) 2000 UNITS TABS Take 2,000 Units by mouth daily.     Historical Provider, MD  clopidogrel (PLAVIX) 75 MG tablet Take 75 mg by mouth daily.  07/22/14   Historical Provider, MD  cyclobenzaprine (FLEXERIL) 5 MG tablet Take 1 tablet (5 mg total) by mouth 3 (three) times daily as needed for muscle spasms. 05/02/15   Pleas Koch, NP  dapagliflozin propanediol (FARXIGA) 10 MG TABS tablet Take 10 mg by mouth daily.  04/18/15   Historical Provider, MD  fenofibrate (TRICOR) 145 MG tablet Take 145 mg by mouth daily.  08/12/14   Historical Provider, MD  furosemide (LASIX) 40 MG tablet Take 40 mg by mouth daily.  09/13/14   Historical Provider, MD  gemfibrozil (LOPID) 600 MG tablet Take 600 mg by mouth daily.  09/13/14   Historical Provider, MD  insulin regular human CONCENTRATED (HUMULIN R) 500 UNIT/ML injection Inject 500 Units as directed once daily. Via pump up to 3 ml  daily 12/02/14   Historical Provider, MD  metFORMIN (GLUCOPHAGE-XR) 500 MG 24 hr tablet Take 1,000 mg by mouth daily with breakfast.  04/18/15 07/17/15  Historical Provider, MD  nadolol (CORGARD) 80 MG tablet Take 80 mg by mouth daily.     Historical Provider, MD  nitroGLYCERIN (NITROSTAT) 0.4 MG SL tablet Place 0.4 mg under the tongue every 5 (five) minutes as needed.     Historical Provider, MD  potassium chloride (KLOR-CON M10) 10 MEQ tablet Take 10 mEq by mouth daily.  09/02/14   Historical Provider, MD  rosuvastatin (CRESTOR) 40 MG tablet Take 40 mg by mouth daily.  09/13/14   Historical  Provider, MD  sacubitril-valsartan (ENTRESTO) 24-26 MG Take 1 tablet by mouth 2 (two) times daily. 04/20/15   Minna Merritts, MD  sertraline (ZOLOFT) 50 MG tablet Take 50 mg by mouth daily.     Historical Provider, MD  sildenafil (VIAGRA) 100 MG tablet Take 100 mg by mouth as needed.     Historical Provider, MD  spironolactone (ALDACTONE) 25 MG tablet Take by mouth daily.  10/25/14 10/25/15  Historical Provider, MD  Vitamin D, Ergocalciferol, (DRISDOL) 50000 UNITS CAPS capsule Take 50,000 Units by mouth 2 (two) times a week.  07/26/14   Historical Provider, MD       Allergies:  Allergies  Allergen Reactions  . Naproxen Other (See Comments)    Hyperactivity     Social History   Social History  . Marital Status: Married    Spouse Name: N/A  . Number of Children: N/A  . Years of Education: N/A   Occupational History  . Not on file.   Social History Main Topics  . Smoking status: Current Every Day Smoker -- 0.50 packs/day    Types: Cigarettes  . Smokeless tobacco: Not on file  . Alcohol Use: No  . Drug Use: No  . Sexual Activity: Not on file   Other Topics Concern  . Not on file   Social History Narrative   Married.   Moved from [place].   Disabled.        Family History  Problem Relation Age of Onset  . Heart attack Mother 41  . Hypertension Mother   . Heart attack Father 60  . Hypertension Father   . Hypertension Maternal Uncle   . Heart disease Maternal Uncle      ROS:  Please see the history of present illness.     All other systems reviewed and negative.    Physical Exam: Blood pressure 100/54, pulse 73, height 6' (1.829 m), weight 286 lb (129.729 kg). General: Well developed, well nourished male in no acute distress. Head: Normocephalic, atraumatic, sclera non-icteric, no xanthomas, nares are without discharge. EENT: normal Lymph Nodes:  none Back: without scoliosis/kyphosis , no CVA tendersness Neck: Negative for carotid bruits. JVD not  elevated. Lungs: Clear bilaterally to auscultation without wheezes, rales, or rhonchi. Breathing is unlabored.  Device pocket well healed; without hematoma or erythema.  There is no tethering  Heart: RRR with S1 S2. No  murmur , rubs, or gallops appreciated. Abdomen: Soft, non-tender, non-distended with normoactive bowel sounds. No hepatomegaly. No rebound/guarding. No obvious abdominal masses. Msk:  Strength and tone appear normal for age. Extremities: No clubbing or cyanosis.  tr edema.  Distal pedal pulses are 2+ and equal bilaterally. Skin: Warm and Dry Neuro: Alert and oriented X 3. CN III-XII intact Grossly normal sensory and motor function . Psych:  Responds to questions appropriately with  a normal affect.      Labs: Cardiac Enzymes No results for input(s): CKTOTAL, CKMB, TROPONINI in the last 72 hours. CBC No results found for: WBC, HGB, HCT, MCV, PLT PROTIME: No results for input(s): LABPROT, INR in the last 72 hours. Chemistry No results for input(s): NA, K, CL, CO2, BUN, CREATININE, CALCIUM, PROT, BILITOT, ALKPHOS, ALT, AST, GLUCOSE in the last 168 hours.  Invalid input(s): LABALBU Lipids No results found for: CHOL, HDL, LDLCALC, TRIG BNP No results found for: PROBNP Thyroid Function Tests: No results for input(s): TSH, T4TOTAL, T3FREE, THYROIDAB in the last 72 hours.  Invalid input(s): FREET3    Miscellaneous No results found for: DDIMER  Radiology/Studies:  No results found.  EKG: Sinus rhythm at 66 Intervals 18/11/40  Outside records from [place] were reviewed.  Assessment and Plan:  Ischemic cardiomyopathy  Congestive heart failure-chronic-systolic class II  Ventricular tachycardia  ICD-St. Jude  Morbidly obese    The patient's ICD has reached ERI. We have reviewed the benefits and risks of generator replacement.  These include but are not limited to lead fracture and infection.  The patient understands, agrees and is willing to proceed.    We  have also reviewed his medications. I will defer to the heart failure team as to whether he should be on nadolol or carvedilol. He is tolerating his Entresto. We'll check his metabolic profile on this new medication regime.  We will check his TSH and his LFTs at the time of preoperative assessment for surveillance of his amiodarone.  I will work on trying to get records from his primary cardiologist    Virl Axe

## 2015-06-16 NOTE — Discharge Instructions (Signed)
Pacemaker Battery Change, Care After °Refer to this sheet in the next few weeks. These instructions provide you with information on caring for yourself after your procedure. Your health care provider may also give you more specific instructions. Your treatment has been planned according to current medical practices, but problems sometimes occur. Call your health care provider if you have any problems or questions after your procedure. °WHAT TO EXPECT AFTER THE PROCEDURE °After your procedure, it is typical to have the following sensations: °· Soreness at the pacemaker site. °HOME CARE INSTRUCTIONS  °· Keep the incision clean and dry. °· Unless advised otherwise, you may shower beginning 48 hours after your procedure. °· For the first week after the replacement, avoid stretching motions that pull at the incision site, and avoid heavy exercise with the arm that is on the same side as the incision. °· Take medicines only as directed by your health care provider. °· Keep all follow-up visits as directed by your health care provider. °SEEK MEDICAL CARE IF:  °· You have pain at the incision site that is not relieved by over-the-counter or prescription medicine. °· There is drainage or pus from the incision site. °· There is swelling larger than a lime at the incision site. °· You develop red streaking that extends above or below the incision site. °· You feel brief, intermittent palpitations, light-headedness, or any symptoms that you feel might be related to your heart. °SEEK IMMEDIATE MEDICAL CARE IF:  °· You experience chest pain that is different than the pain at the pacemaker site. °· You experience shortness of breath. °· You have palpitations or irregular heartbeat. °· You have light-headedness that does not go away quickly. °· You faint. °· You have pain that gets worse and is not relieved by medicine. °Document Released: 06/24/2013 Document Revised: 01/18/2014 Document Reviewed: 06/24/2013 °ExitCare® Patient  Information ©2015 [employer]. This information is not intended to replace advice given to you by your health care provider. Make sure you discuss any questions you have with your health care provider. ° °

## 2015-06-16 NOTE — Interval H&P Note (Signed)
ICD Criteria 3 Current LVEF:25%. Within 12 months prior to implant: Yes   Heart failure history: Yes, Class II  Cardiomyopathy history: Yes, Ischemic Cardiomyopathy.  Atrial Fibrillation/Atrial Flutter: No.  Ventricular tachycardia history: No.  Cardiac arrest history: No.  History of syndromes with risk of sudden death: No.  Previous ICD: Yes, Reason for ICD:  Primary prevention.  Current ICD indication: Secondary  PPM indication: No.   Class I or II Bradycardia indication present: No  Beta Blocker therapy for 3 or more months: Yes, prescribed.   Ace Inhibitor/ARB therapy for 3 or more months: Yes, prescribed.   History and Physical Interval Note:  06/16/2015 4:31 PM  Brian Williams  has presented today for surgery, with the diagnosis of ppm eol  The various methods of treatment have been discussed with the patient and family. After consideration of risks, benefits and other options for treatment, the patient has consented to  Procedure(s): ICD [employer] (N/A) as a surgical intervention .  The patient's history has been reviewed, patient examined, no change in status, stable for surgery.  I have reviewed the patient's chart and labs.  Questions were answered to the patient's satisfaction.     Brian Williams

## 2015-06-17 ENCOUNTER — Other Ambulatory Visit: Payer: Self-pay | Admitting: Student

## 2015-06-17 ENCOUNTER — Encounter (HOSPITAL_COMMUNITY): Payer: Self-pay | Admitting: Internal Medicine

## 2015-06-17 ENCOUNTER — Telehealth: Payer: Self-pay | Admitting: Student

## 2015-06-17 MED FILL — Gentamicin Sulfate Inj 40 MG/ML: INTRAMUSCULAR | Qty: 2 | Status: AC

## 2015-06-17 MED FILL — Sodium Chloride Irrigation Soln 0.9%: Qty: 500 | Status: AC

## 2015-06-17 NOTE — Telephone Encounter (Signed)
  Brian Williams called this evening concerned about the pain he was experiencing at his generator site following generator replacement on 06/16/2015.  He has been taking Tylenol regularly for the pain but is getting no relief. Reports he had trouble sleeping last night due to the pain.   He reports minimal, if any swelling at that site. No evidence of infection or erythema.  Ultram 50mg  Q6H, 15 tablets, no refills was called into the [facility] in [place] on [address]. Patient has allergy to Naproxen.  Patient was notified of this. Instructed to call back if he experiences increasing swelling or signs of infection at the site. All questions answered.  Signed, Erma Heritage, PA-C 06/17/2015, 7:13 PM Pager: [phone]

## 2015-06-23 ENCOUNTER — Ambulatory Visit: Payer: Managed Care, Other (non HMO)

## 2015-06-26 ENCOUNTER — Encounter: Payer: Self-pay | Admitting: Primary Care

## 2015-06-27 ENCOUNTER — Telehealth: Payer: Self-pay | Admitting: Primary Care

## 2015-06-27 ENCOUNTER — Other Ambulatory Visit: Payer: Self-pay | Admitting: Primary Care

## 2015-06-27 DIAGNOSIS — F419 Anxiety disorder, unspecified: Secondary | ICD-10-CM

## 2015-06-27 DIAGNOSIS — F32A Depression, unspecified: Secondary | ICD-10-CM

## 2015-06-27 DIAGNOSIS — F329 Major depressive disorder, single episode, unspecified: Secondary | ICD-10-CM

## 2015-06-27 MED ORDER — SERTRALINE HCL 100 MG PO TABS: 100.0000 mg | ORAL_TABLET | Freq: Every day | ORAL | Status: DC

## 2015-06-27 NOTE — Telephone Encounter (Signed)
Will you please schedule Brian Williams for follow up for his anxiety in 6 weeks? Please also notify him that I sent Zoloft 100 mg tablets to his pharmacy which is a change from his 50 mg tablets. He is to take 1 tablet by mouth once daily.  Thanks.

## 2015-06-27 NOTE — Telephone Encounter (Signed)
Message left for patient to return my call.  

## 2015-06-29 ENCOUNTER — Ambulatory Visit: Payer: Managed Care, Other (non HMO) | Admitting: Primary Care

## 2015-06-30 ENCOUNTER — Encounter: Payer: Self-pay | Admitting: Internal Medicine

## 2015-06-30 ENCOUNTER — Ambulatory Visit (INDEPENDENT_AMBULATORY_CARE_PROVIDER_SITE_OTHER): Payer: Managed Care, Other (non HMO) | Admitting: *Deleted

## 2015-06-30 DIAGNOSIS — I420 Dilated cardiomyopathy: Secondary | ICD-10-CM | POA: Diagnosis not present

## 2015-06-30 NOTE — Telephone Encounter (Signed)
Message left for patient to return my call.  

## 2015-06-30 NOTE — Telephone Encounter (Signed)
Pt called back. He was informed of Clearence Cheek comments and is scheduled for cpe 08/08/15

## 2015-07-01 LAB — CUP PACEART INCLINIC DEVICE CHECK
Battery Remaining Longevity: 103.2
Brady Statistic RA Percent Paced: 1.4 %
Brady Statistic RV Percent Paced: 0.01 %
Date Time Interrogation Session: 20161013154553
HighPow Impedance: 43.7168
Implantable Lead Implant Date: 20160929
Implantable Lead Implant Date: 20160929
Implantable Lead Location: 753859
Implantable Lead Location: 753860
Implantable Lead Model: 7071
Lead Channel Impedance Value: 400 Ohm
Lead Channel Impedance Value: 450 Ohm
Lead Channel Pacing Threshold Amplitude: 0.75 V
Lead Channel Pacing Threshold Amplitude: 0.75 V
Lead Channel Pacing Threshold Amplitude: 0.75 V
Lead Channel Pacing Threshold Amplitude: 0.75 V
Lead Channel Pacing Threshold Pulse Width: 0.4 ms
Lead Channel Pacing Threshold Pulse Width: 0.4 ms
Lead Channel Pacing Threshold Pulse Width: 0.4 ms
Lead Channel Pacing Threshold Pulse Width: 0.4 ms
Lead Channel Sensing Intrinsic Amplitude: 11.7 mV
Lead Channel Sensing Intrinsic Amplitude: 5 mV
Lead Channel Setting Pacing Amplitude: 2 V
Lead Channel Setting Pacing Amplitude: 2.5 V
Lead Channel Setting Pacing Pulse Width: 0.4 ms
Lead Channel Setting Sensing Sensitivity: 0.5 mV
Pulse Gen Serial Number: 7306375
Zone Setting Detection Interval: 250 ms
Zone Setting Detection Interval: 300 ms
Zone Setting Vendor Type Category: 773185
Zone Setting Vendor Type Category: 773188

## 2015-07-01 NOTE — Progress Notes (Signed)
Wound check appointment. Steri-strips removed. Wound without redness or edema. Incision edges approximated, wound well healed. Normal device function. Thresholds, sensing, and impedances consistent with implant measurements. Device programmed at appropriate safety margins. Histogram distribution appropriate for patient and level of activity. No mode switches or ventricular arrhythmias noted. Patient educated about wound care, arm mobility, lifting restrictions, shock plan. ROV in 3 months with SK.

## 2015-07-04 ENCOUNTER — Encounter: Payer: Self-pay | Admitting: Cardiovascular Disease

## 2015-07-04 ENCOUNTER — Encounter: Payer: Self-pay | Admitting: Primary Care

## 2015-07-07 ENCOUNTER — Ambulatory Visit: Payer: Managed Care, Other (non HMO) | Admitting: Primary Care

## 2015-07-27 ENCOUNTER — Telehealth: Payer: Self-pay | Admitting: Family Medicine

## 2015-07-27 DIAGNOSIS — IMO0002 Reserved for concepts with insufficient information to code with codable children: Secondary | ICD-10-CM

## 2015-07-27 DIAGNOSIS — E785 Hyperlipidemia, unspecified: Secondary | ICD-10-CM

## 2015-07-27 DIAGNOSIS — E1165 Type 2 diabetes mellitus with hyperglycemia: Secondary | ICD-10-CM

## 2015-07-27 DIAGNOSIS — E118 Type 2 diabetes mellitus with unspecified complications: Principal | ICD-10-CM

## 2015-07-27 DIAGNOSIS — Z1159 Encounter for screening for other viral diseases: Secondary | ICD-10-CM

## 2015-07-27 DIAGNOSIS — Z125 Encounter for screening for malignant neoplasm of prostate: Secondary | ICD-10-CM

## 2015-07-27 NOTE — Telephone Encounter (Signed)
At time of labs..Please let pt know that I have added hep C testing to their routine labs given CDC recommends screening anyone born between 1945-1965 (higher risk population for various reasons) given it is a dormant virus (for 20-30 years) that later can cause liver cancer and liver cirrhosis. Covered by insurance.  If pt refuses, or has had in past or would to discuss further...please cancel and notify me.   

## 2015-07-27 NOTE — Telephone Encounter (Signed)
-----   Message from Marchia Bond sent at 07/21/2015 12:52 PM EDT ----- Regarding: Cpx labs Thurs 11/10 need orders, thanks, :-) Please order  future cpx labs for pt's upcoming lab appt. Thanks Aniceto Boss

## 2015-07-28 ENCOUNTER — Other Ambulatory Visit: Payer: Managed Care, Other (non HMO)

## 2015-08-07 ENCOUNTER — Encounter: Payer: Self-pay | Admitting: Cardiovascular Disease

## 2015-08-08 ENCOUNTER — Other Ambulatory Visit: Payer: Self-pay

## 2015-08-08 ENCOUNTER — Encounter: Payer: Managed Care, Other (non HMO) | Admitting: Primary Care

## 2015-08-08 MED ORDER — ROSUVASTATIN CALCIUM 40 MG PO TABS: 40.0000 mg | ORAL_TABLET | Freq: Every day | ORAL | Status: DC

## 2015-08-22 ENCOUNTER — Ambulatory Visit: Payer: Managed Care, Other (non HMO) | Admitting: Cardiovascular Disease

## 2015-09-26 DIAGNOSIS — E781 Pure hyperglyceridemia: Secondary | ICD-10-CM | POA: Insufficient documentation

## 2015-10-03 ENCOUNTER — Ambulatory Visit: Payer: BLUE CROSS/BLUE SHIELD | Admitting: Family Medicine

## 2015-10-04 ENCOUNTER — Ambulatory Visit (INDEPENDENT_AMBULATORY_CARE_PROVIDER_SITE_OTHER): Payer: BLUE CROSS/BLUE SHIELD | Admitting: Internal Medicine

## 2015-10-04 ENCOUNTER — Encounter: Payer: Self-pay | Admitting: Internal Medicine

## 2015-10-04 VITALS — BP 122/60 | HR 75 | Ht 72.0 in | Wt 281.0 lb

## 2015-10-04 DIAGNOSIS — Z95810 Presence of automatic (implantable) cardiac defibrillator: Secondary | ICD-10-CM | POA: Diagnosis not present

## 2015-10-04 DIAGNOSIS — I420 Dilated cardiomyopathy: Secondary | ICD-10-CM

## 2015-10-04 LAB — CUP PACEART INCLINIC DEVICE CHECK
Battery Remaining Longevity: 102
Brady Statistic RA Percent Paced: 2 %
Brady Statistic RV Percent Paced: 0.01 %
Date Time Interrogation Session: 20170117120902
HighPow Impedance: 47.0215
Implantable Lead Implant Date: 20160929
Implantable Lead Implant Date: 20160929
Implantable Lead Location: 753859
Implantable Lead Location: 753860
Implantable Lead Model: 7071
Lead Channel Impedance Value: 450 Ohm
Lead Channel Impedance Value: 462.5 Ohm
Lead Channel Pacing Threshold Amplitude: 0.75 V
Lead Channel Pacing Threshold Amplitude: 1 V
Lead Channel Pacing Threshold Pulse Width: 0.4 ms
Lead Channel Pacing Threshold Pulse Width: 0.4 ms
Lead Channel Sensing Intrinsic Amplitude: 11.7 mV
Lead Channel Sensing Intrinsic Amplitude: 5 mV
Lead Channel Setting Pacing Amplitude: 2 V
Lead Channel Setting Pacing Amplitude: 2.5 V
Lead Channel Setting Pacing Pulse Width: 0.4 ms
Lead Channel Setting Sensing Sensitivity: 0.5 mV
Pulse Gen Serial Number: 7306375

## 2015-10-04 NOTE — Patient Instructions (Addendum)
Medication Instructions: 1) Decrease amiodarone to 200 mg 1/2 tablet (100 mg) by mouth once daily  Labwork: - none  Procedures/Testing: - none  Follow-Up: - Your physician recommends that you schedule a follow-up appointment in: March with Dr. Rockey Situ  - Remote monitoring is used to monitor your Pacemaker of ICD from home. This monitoring reduces the number of office visits required to check your device to one time per year. It allows [place] to keep an eye on the functioning of your device to ensure it is working properly. You are scheduled for a device check from home on 01/03/16. You may send your transmission at any time that day. If you have a wireless device, the transmission will be sent automatically. After your physician reviews your transmission, you will receive a postcard with your next transmission date.  - Your physician wants you to follow-up in: 6 months with Dr. Caryl Comes. You will receive a reminder letter in the mail two months in advance. If you don't receive a letter, please call our office to schedule the follow-up appointment.  Any Additional Special Instructions Will Be Listed Below (If Applicable).

## 2015-10-04 NOTE — Progress Notes (Signed)
Patient Care Team: [name], DO as PCP - General (Family Medicine) Minna Merritts, MD as Consulting Physician (Cardiology)   HPI  Brian Williams is a 55 y.o. male  Seen in follow-up for ICD implanted for primary prevention. He has had intercurrent infection. His device reached ERI and underwent generator replacement 9/16. He has an underlying history of ischemic heart disease  Records and Results Reviewed   outpatient records demonstrated a Myoview 2015 ejection fraction 35% Last catheterization was 2011demonstrating persistent disease but not amenable to PCI  Device History: ICD implanted  2009 History of appropriate therapy: No History of AAD therapy: Yes  Review of the old records also demonstrates that he underwent catheter ablation for VT and PVCs in 2013. Records also suggest that there was a history of complex ventricular ectopy but no sustained ventricular arrhythmias. I am not entirely sure of this point. Amiodarone was started at that time and was maintained following the ablation.  Past Medical History  Diagnosis Date  . Coronary artery disease   . Hypertension   . Hyperlipidemia   . CHF (congestive heart failure) ([place])   . Diabetes mellitus without complication ([place])   . Arrhythmia   . MI (myocardial infarction) ([place])   . Sleep apnea   . AICD (automatic cardioverter/defibrillator) present   . Ventricular tachycardia ([place])   . ED (erectile dysfunction)     Past Surgical History  Procedure Laterality Date  . Cardiac electrophysiology study and ablation    . Cardiac catheterization    . Coronary angioplasty with stent placement    . Knee surgery      bilateral knee   . Back surgery    . Cardiac defibrillator placement  10/16/2007    ICD Model number 2207-36 serial number [id number]  . Insert / replace / remove pacemaker    . Ep implantable device N/A 06/16/2015    Procedure: ICD Generator Changeout;  Surgeon: Deboraha Sprang, MD;  Location: [place]  [facility];  Service: Cardiovascular;  Laterality: N/A;    Current Outpatient Prescriptions  Medication Sig Dispense Refill  . carvedilol (COREG) 12.5 MG tablet Take 12.5 mg by mouth 2 (two) times daily.    . dapagliflozin propanediol (FARXIGA) 5 MG TABS tablet Take 5 mg by mouth.    . ALPRAZolam (XANAX) 0.5 MG tablet Take 0.5 mg by mouth at bedtime as needed.     Marland Kitchen amiodarone (PACERONE) 200 MG tablet Take 200 mg by mouth daily.     Marland Kitchen aspirin EC 81 MG tablet Take 81 mg by mouth daily.    . Cholecalciferol (D 2000) 2000 UNITS TABS Take 2,000 Units by mouth daily.     . clopidogrel (PLAVIX) 75 MG tablet Take 75 mg by mouth daily.     . fenofibrate (TRICOR) 145 MG tablet Take 145 mg by mouth daily.     . furosemide (LASIX) 40 MG tablet Take 40 mg by mouth daily.     Marland Kitchen gemfibrozil (LOPID) 600 MG tablet Take 600 mg by mouth daily.     . insulin regular human CONCENTRATED (HUMULIN R) 500 UNIT/ML injection Inject 500 Units as directed once daily. Via pump up to 3 ml daily    . nitroGLYCERIN (NITROSTAT) 0.4 MG SL tablet Place 0.4 mg under the tongue every 5 (five) minutes as needed.     . potassium chloride (KLOR-CON M10) 10 MEQ tablet Take 10 mEq by mouth daily.     . rosuvastatin (  CRESTOR) 40 MG tablet Take 1 tablet (40 mg total) by mouth daily. 90 tablet 3  . sacubitril-valsartan (ENTRESTO) 49-51 MG Take 1 tablet by mouth 2 (two) times daily. 60 tablet 6  . sertraline (ZOLOFT) 100 MG tablet Take 1 tablet (100 mg total) by mouth daily. 30 tablet 2  . spironolactone (ALDACTONE) 25 MG tablet Take by mouth daily.      No current facility-administered medications for this visit.    Allergies  Allergen Reactions  . Naproxen Other (See Comments)    Other reaction(s): Other (see comments) Causes hyperactivity Hyperactivity       Review of Systems negative except from HPI and PMH  Physical Exam BP 122/60 mmHg  Pulse 75  Ht 6' (1.829 m)  Wt 281 lb (127.461 kg)  BMI 38.10 kg/m2 Well  developed and well nourished in no acute distress HENT normal E scleral and icterus clear Neck Supple JVP flat; carotids brisk and full Clear to ausculation Device pocket well healed; without hematoma or erythema.  There is no tethering Regular rate and rhythm, no murmurs gallops or rub Soft with active bowel sounds No clubbing cyanosis  Edema Alert and oriented, grossly normal motor and sensory function Skin Warm and Dry  ECG sinus 75 18/11/48  Assessment and  Plan Ischemic cardiomyopathy  Implantable defibrillator-St. Jude with recent device generator replacement  History of inappropriate therapy  Congestive heart failure-chronic-systolic  Amiodarone therapy    Review of the patient's old records were informative as noted above. Given that there is been no history of appropriate therapy and there has been intercurrent ablation, we will work on weaning him off of his amiodarone if possible. His ventricular ectopy is quite symptomatic in the past and so we will follow that closely. For now we will decrease it from 200--100 mg daily. We will check his amiodarone surveillance laboratories  Past questions regarding device programming. He has been programmed according to the [employer] algorithms which will hopefully decrease the risk of recurrent inappropriate therapy.  Without symptoms of ischemia  Euvolemic continue current meds

## 2015-10-06 ENCOUNTER — Encounter: Payer: Self-pay | Admitting: Family Medicine

## 2015-10-06 ENCOUNTER — Ambulatory Visit (INDEPENDENT_AMBULATORY_CARE_PROVIDER_SITE_OTHER): Payer: BLUE CROSS/BLUE SHIELD | Admitting: Family Medicine

## 2015-10-06 VITALS — BP 112/60 | HR 69 | Temp 98.7°F | Ht 72.0 in | Wt 294.0 lb

## 2015-10-06 DIAGNOSIS — Z0001 Encounter for general adult medical examination with abnormal findings: Secondary | ICD-10-CM | POA: Insufficient documentation

## 2015-10-06 DIAGNOSIS — Z0000 Encounter for general adult medical examination without abnormal findings: Secondary | ICD-10-CM

## 2015-10-06 DIAGNOSIS — L732 Hidradenitis suppurativa: Secondary | ICD-10-CM

## 2015-10-06 DIAGNOSIS — IMO0002 Reserved for concepts with insufficient information to code with codable children: Secondary | ICD-10-CM

## 2015-10-06 DIAGNOSIS — Z1159 Encounter for screening for other viral diseases: Secondary | ICD-10-CM | POA: Diagnosis not present

## 2015-10-06 DIAGNOSIS — E785 Hyperlipidemia, unspecified: Secondary | ICD-10-CM | POA: Diagnosis not present

## 2015-10-06 DIAGNOSIS — E1165 Type 2 diabetes mellitus with hyperglycemia: Secondary | ICD-10-CM

## 2015-10-06 DIAGNOSIS — I420 Dilated cardiomyopathy: Secondary | ICD-10-CM

## 2015-10-06 DIAGNOSIS — I10 Essential (primary) hypertension: Secondary | ICD-10-CM

## 2015-10-06 DIAGNOSIS — E118 Type 2 diabetes mellitus with unspecified complications: Secondary | ICD-10-CM

## 2015-10-06 LAB — LIPID PANEL
Cholesterol: 171 mg/dL (ref 0–200)
HDL: 20.5 mg/dL — ABNORMAL LOW (ref >39.00)
Total CHOL/HDL Ratio: 8
Triglycerides: 574 mg/dL — ABNORMAL HIGH (ref 0.0–149.0)

## 2015-10-06 LAB — LDL CHOLESTEROL, DIRECT: Direct LDL: 65 mg/dL

## 2015-10-06 NOTE — Progress Notes (Signed)
Pre visit review using our clinic review tool, if applicable. No additional management support is needed unless otherwise documented below in the visit note. 

## 2015-10-06 NOTE — Assessment & Plan Note (Signed)
Uncontrolled. Has an insulin pump. Is followed by endocrinology.

## 2015-10-06 NOTE — Assessment & Plan Note (Signed)
Well controlled 

## 2015-10-06 NOTE — Assessment & Plan Note (Signed)
Followed closely by cardiology given significant cardiac disease.

## 2015-10-06 NOTE — Assessment & Plan Note (Signed)
Obtaining records regarding tetanus and pneumococcal vaccination. Hepatitis C and lipid panel today. Colonoscopy up-to-date. Encouraged smoking cessation and weight loss.

## 2015-10-06 NOTE — Patient Instructions (Signed)
Continue your current medications.  Be sure to follow up with your specialists.  I will be in touch regarding your pneumonia vaccine.  Take care  Dr. Lacinda Axon   Follow up annually or sooner if needed.

## 2015-10-06 NOTE — Progress Notes (Signed)
Subjective:  Patient ID: Brian Williams, male    DOB: March 10, 1961  Age: 55 y.o. MRN: [id number]  CC: Establish care  HPI Brian Williams is a 55 y.o. male with a complicated past medical history including CAD, hypertension, hyperlipidemia, CHF, OSA, DM 2 uncontrolled presents to establish care  Preventative Healthcare  Colonoscopy: Up to date.   Immunizations  Tetanus - ? Will get records.   Pneumococcal - ? Will get records.   Flu - Up to date.   Zoster - Not indicated.   Hepatitis C screening - Screening today.  Labs: Lipid panel today.   Exercise: No.  Alcohol use: See below.   Smoking/tobacco use: Current smoker.   Regular dental exams: yes.   Wears seat belt: yes.   PMH, Surgical Hx, Family Hx, Social History reviewed and updated as below.  Past Medical History  Diagnosis Date  . Coronary artery disease   . Hypertension   . Hyperlipidemia   . CHF (congestive heart failure) ([place])   . Diabetes mellitus without complication ([place])   . MI (myocardial infarction) ([place])   . Sleep apnea   . AICD (automatic cardioverter/defibrillator) present   . Ventricular tachycardia ([place])     s/p RFCA PVCs 2013  . ED (erectile dysfunction)   . Chicken pox   . Depression   . GERD (gastroesophageal reflux disease)   . Heart disease    Past Surgical History  Procedure Laterality Date  . Cardiac electrophysiology study and ablation    . Cardiac catheterization    . Coronary angioplasty with stent placement    . Knee surgery      bilateral knee   . Back surgery    . Cardiac defibrillator placement  10/16/2007    ICD Model number 2207-36 serial number [id number]  . Insert / replace / remove pacemaker    . Ep implantable device N/A 06/16/2015    Procedure: ICD Generator Changeout;  Surgeon: Deboraha Sprang, MD;  Location: [facility];  Service: Cardiovascular;  Laterality: N/A;  . Cholecystectomy  2001   Family History  Problem Relation Age of Onset  . Heart attack Mother  73  . Hypertension Mother   . Heart attack Father 80  . Hypertension Father   . Hypertension Maternal Uncle   . Heart disease Maternal Uncle   . Heart disease Maternal Grandmother   . Stroke Maternal Grandmother    Social History  Substance Use Topics  . Smoking status: Current Every Day Smoker -- 0.50 packs/day    Types: Cigarettes  . Smokeless tobacco: Never Used  . Alcohol Use: 0.0 - 0.6 oz/week    0-1 Standard drinks or equivalent per week   Review of Systems  Respiratory: Positive for shortness of breath.   Cardiovascular: Positive for palpitations.       Has had chest pain but none recently.  Gastrointestinal: Positive for diarrhea.  Endocrine: Positive for polydipsia.  Genitourinary:       Sexual difficulty.  Musculoskeletal: Positive for myalgias.  Neurological: Positive for dizziness and numbness.  Psychiatric/Behavioral:       Sadness, stress, anxiety, memory difficulty.   All other systems reviewed and are negative.  Objective:   Today's Vitals: BP 112/60 mmHg  Pulse 69  Temp(Src) 98.7 F (37.1 C) (Oral)  Ht 6' (1.829 m)  Wt 294 lb (133.358 kg)  BMI 39.86 kg/m2  SpO2 94%  Physical Exam  Constitutional: He is oriented to person, place, and time. He appears well-developed.  No distress.  Obese.  HENT:  Head: Normocephalic and atraumatic.  Nose: Nose normal.  Mouth/Throat: Oropharynx is clear and moist. No oropharyngeal exudate.  Normal TM's bilaterally.   Eyes: Conjunctivae are normal. No scleral icterus.  Neck: Neck supple.  Cardiovascular: Normal rate and regular rhythm.   No murmur heard. No LE edema.   Pulmonary/Chest: Effort normal and breath sounds normal. He has no wheezes. He has no rales.  Abdominal: Soft. He exhibits no distension. There is no tenderness. There is no rebound and no guarding.  Musculoskeletal: Normal range of motion.  Lymphadenopathy:    He has no cervical adenopathy.  Neurological: He is alert and oriented to person,  place, and time.  Skin:  Axilla with significant induration bilaterally. Right axilla with an area of developing abscess.  Psychiatric: He has a normal mood and affect.  Vitals reviewed.  Assessment & Plan:   Problem List Items Addressed This Visit    Congestive dilated cardiomyopathy ([place])    Followed closely by cardiology given significant cardiac disease.      Diabetes mellitus type 2, uncontrolled, with complications (HCC)    Uncontrolled. Has an insulin pump. Is followed by endocrinology.      Essential hypertension    Well-controlled.      Hyperlipidemia   Relevant Orders   Lipid panel   Hidradenitis suppurativa    Has upcoming appointment with a general surgeon for evaluation/discussion about surgery.      Preventative health care - Primary    Obtaining records regarding tetanus and pneumococcal vaccination. Hepatitis C and lipid panel today. Colonoscopy up-to-date. Encouraged smoking cessation and weight loss.       Other Visit Diagnoses    Need for hepatitis C screening test        Relevant Orders    Hepatitis C Antibody       Outpatient Encounter Prescriptions as of 10/06/2015  Medication Sig  . ALPRAZolam (XANAX) 0.5 MG tablet Take 0.5 mg by mouth at bedtime as needed.   Marland Kitchen amiodarone (PACERONE) 200 MG tablet Take 100 mg by mouth daily. Take 1/2 tablet (100 mg) by mouth once daily  . aspirin EC 81 MG tablet Take 81 mg by mouth daily.  . carvedilol (COREG) 12.5 MG tablet Take 12.5 mg by mouth 2 (two) times daily.  . Cholecalciferol (D 2000) 2000 UNITS TABS Take 2,000 Units by mouth daily.   . clopidogrel (PLAVIX) 75 MG tablet Take 75 mg by mouth daily.   . dapagliflozin propanediol (FARXIGA) 5 MG TABS tablet Take 5 mg by mouth.  . fenofibrate (TRICOR) 145 MG tablet Take 145 mg by mouth daily.   . furosemide (LASIX) 40 MG tablet Take 40 mg by mouth daily.   Marland Kitchen gemfibrozil (LOPID) 600 MG tablet Take 600 mg by mouth daily.   . insulin regular human  CONCENTRATED (HUMULIN R) 500 UNIT/ML injection Inject 500 Units as directed once daily. Via pump up to 3 ml daily  . nitroGLYCERIN (NITROSTAT) 0.4 MG SL tablet Place 0.4 mg under the tongue every 5 (five) minutes as needed.   . potassium chloride (KLOR-CON M10) 10 MEQ tablet Take 10 mEq by mouth daily.   . rosuvastatin (CRESTOR) 40 MG tablet Take 1 tablet (40 mg total) by mouth daily.  . sacubitril-valsartan (ENTRESTO) 49-51 MG Take 1 tablet by mouth 2 (two) times daily.  . sertraline (ZOLOFT) 100 MG tablet Take 1 tablet (100 mg total) by mouth daily.  Marland Kitchen spironolactone (ALDACTONE) 25 MG tablet Take  by mouth daily.    No facility-administered encounter medications on file as of 10/06/2015.    Follow-up: Annually or sooner if needed.   Penelope

## 2015-10-06 NOTE — Assessment & Plan Note (Signed)
Has upcoming appointment with a general surgeon for evaluation/discussion about surgery.

## 2015-10-07 LAB — HEPATITIS C ANTIBODY: HCV Ab: NEGATIVE

## 2015-10-11 ENCOUNTER — Telehealth: Payer: Self-pay

## 2015-10-11 NOTE — Telephone Encounter (Signed)
Prior auth for [employer] 49-51 sent to [facility].

## 2015-10-14 ENCOUNTER — Encounter: Payer: Self-pay | Admitting: Internal Medicine

## 2015-10-24 ENCOUNTER — Encounter: Payer: Self-pay | Admitting: Internal Medicine

## 2015-10-26 ENCOUNTER — Ambulatory Visit: Payer: Managed Care, Other (non HMO) | Admitting: Cardiovascular Disease

## 2015-11-09 ENCOUNTER — Emergency Department: Payer: BLUE CROSS/BLUE SHIELD

## 2015-11-09 ENCOUNTER — Emergency Department
Admission: EM | Admit: 2015-11-09 | Discharge: 2015-11-09 | Disposition: A | Discharge status: Home / Self Care | Payer: BLUE CROSS/BLUE SHIELD | Attending: Emergency Medicine | Admitting: Emergency Medicine

## 2015-11-09 ENCOUNTER — Encounter: Payer: Self-pay | Admitting: *Deleted

## 2015-11-09 DIAGNOSIS — S7002XA Contusion of left hip, initial encounter: Secondary | ICD-10-CM | POA: Insufficient documentation

## 2015-11-09 DIAGNOSIS — Y998 Other external cause status: Secondary | ICD-10-CM | POA: Diagnosis not present

## 2015-11-09 DIAGNOSIS — S0990XA Unspecified injury of head, initial encounter: Secondary | ICD-10-CM | POA: Insufficient documentation

## 2015-11-09 DIAGNOSIS — Z79899 Other long term (current) drug therapy: Secondary | ICD-10-CM | POA: Diagnosis not present

## 2015-11-09 DIAGNOSIS — Y9389 Activity, other specified: Secondary | ICD-10-CM | POA: Diagnosis not present

## 2015-11-09 DIAGNOSIS — Y9289 Other specified places as the place of occurrence of the external cause: Secondary | ICD-10-CM | POA: Insufficient documentation

## 2015-11-09 DIAGNOSIS — F17210 Nicotine dependence, cigarettes, uncomplicated: Secondary | ICD-10-CM | POA: Diagnosis not present

## 2015-11-09 DIAGNOSIS — Z794 Long term (current) use of insulin: Secondary | ICD-10-CM | POA: Insufficient documentation

## 2015-11-09 DIAGNOSIS — S79912A Unspecified injury of left hip, initial encounter: Secondary | ICD-10-CM | POA: Diagnosis present

## 2015-11-09 DIAGNOSIS — I10 Essential (primary) hypertension: Secondary | ICD-10-CM | POA: Diagnosis not present

## 2015-11-09 DIAGNOSIS — E119 Type 2 diabetes mellitus without complications: Secondary | ICD-10-CM | POA: Insufficient documentation

## 2015-11-09 DIAGNOSIS — W01198A Fall on same level from slipping, tripping and stumbling with subsequent striking against other object, initial encounter: Secondary | ICD-10-CM | POA: Insufficient documentation

## 2015-11-09 DIAGNOSIS — Z7982 Long term (current) use of aspirin: Secondary | ICD-10-CM | POA: Diagnosis not present

## 2015-11-09 DIAGNOSIS — Z7902 Long term (current) use of antithrombotics/antiplatelets: Secondary | ICD-10-CM | POA: Diagnosis not present

## 2015-11-09 LAB — GLUCOSE, CAPILLARY: Glucose-Capillary: 130 mg/dL — ABNORMAL HIGH (ref 65–99)

## 2015-11-09 MED ORDER — HYDROCODONE-ACETAMINOPHEN 5-325 MG PO TABS: 1.0000 | ORAL_TABLET | Freq: Four times a day (QID) | ORAL | Status: DC | PRN

## 2015-11-09 NOTE — ED Provider Notes (Signed)
[facility] Emergency Department Provider Note  ____________________________________________  Time seen: Approximately 10:39 PM  I have reviewed the triage vital signs and the nursing notes.   HISTORY  Chief Complaint Fall; Hip Pain; and Head Injury    HPI Diallo Pawlus is a 55 y.o. male presents for evaluation after striking his head on a nightstand. The patient reports he was laying in bed with his CPAP on, when the dog entered the room and he attempted to roll to the side and pick the dog up. He slipped off the side of the bed and struck the left back of his head and nightstand. He reports it feels slightly swollen but no cut. Having a mild headache which is now better. Denies neck injury or neck pain. He also landed on his left side and reports he feels like he bruised the area around the left hip, but is able to walk on it. Denies any numbness tingling or weakness. No fevers chills or chest pain. No trouble breathing. Reports he has congestive heart failure, but not causing any problems presently.  No preceding chest pain, palpitations or shortness of breath.   Past Medical History  Diagnosis Date  . Coronary artery disease   . Hypertension   . Hyperlipidemia   . CHF (congestive heart failure) ([place])   . Diabetes mellitus without complication ([place])   . MI (myocardial infarction) ([place])   . Sleep apnea   . AICD (automatic cardioverter/defibrillator) present   . Ventricular tachycardia ([place])     s/p RFCA PVCs 2013  . ED (erectile dysfunction)   . Chicken pox   . Depression   . GERD (gastroesophageal reflux disease)   . Heart disease     Patient Active Problem List   Diagnosis Date Noted  . Hidradenitis suppurativa 10/06/2015  . Preventative health care 10/06/2015  . Chronic back pain 05/02/2015  . Essential hypertension 05/02/2015  . Hyperlipidemia 05/02/2015  . Generalized anxiety disorder 05/02/2015  . Coronary artery disease involving native  coronary artery of native heart without angina pectoris 04/20/2015  . SOB (shortness of breath) 04/20/2015  . ICD (implantable cardioverter-defibrillator) in place 04/20/2015  . Congestive dilated cardiomyopathy ([place]) 04/20/2015  . Diabetes mellitus type 2, uncontrolled, with complications ([place]) [id number]    Past Surgical History  Procedure Laterality Date  . Cardiac electrophysiology study and ablation    . Cardiac catheterization    . Coronary angioplasty with stent placement    . Knee surgery      bilateral knee   . Back surgery    . Cardiac defibrillator placement  10/16/2007    ICD Model number 2207-36 serial number [id number]  . Insert / replace / remove pacemaker    . Ep implantable device N/A 06/16/2015    Procedure: ICD Generator Changeout;  Surgeon: Deboraha Sprang, MD;  Location: [facility];  Service: Cardiovascular;  Laterality: N/A;  . Cholecystectomy  2001    Current Outpatient Rx  Name  Route  Sig  Dispense  Refill  . ALPRAZolam (XANAX) 0.5 MG tablet   Oral   Take 0.5 mg by mouth at bedtime as needed.          Marland Kitchen amiodarone (PACERONE) 200 MG tablet   Oral   Take 100 mg by mouth daily. Take 1/2 tablet (100 mg) by mouth once daily         . aspirin EC 81 MG tablet   Oral   Take 81 mg by mouth  daily.         . carvedilol (COREG) 12.5 MG tablet   Oral   Take 12.5 mg by mouth 2 (two) times daily.         . Cholecalciferol (D 2000) 2000 UNITS TABS   Oral   Take 2,000 Units by mouth daily.          . clopidogrel (PLAVIX) 75 MG tablet   Oral   Take 75 mg by mouth daily.          . dapagliflozin propanediol (FARXIGA) 5 MG TABS tablet   Oral   Take 5 mg by mouth.         . fenofibrate (TRICOR) 145 MG tablet   Oral   Take 145 mg by mouth daily.          . furosemide (LASIX) 40 MG tablet   Oral   Take 40 mg by mouth daily.          Marland Kitchen gemfibrozil (LOPID) 600 MG tablet   Oral   Take 600 mg by mouth daily.          Marland Kitchen  HYDROcodone-acetaminophen (NORCO/VICODIN) 5-325 MG tablet   Oral   Take 1 tablet by mouth every 6 (six) hours as needed for moderate pain.   15 tablet   0   . insulin regular human CONCENTRATED (HUMULIN R) 500 UNIT/ML injection      Inject 500 Units as directed once daily. Via pump up to 3 ml daily         . nitroGLYCERIN (NITROSTAT) 0.4 MG SL tablet   Sublingual   Place 0.4 mg under the tongue every 5 (five) minutes as needed.          . potassium chloride (KLOR-CON M10) 10 MEQ tablet   Oral   Take 10 mEq by mouth daily.          . rosuvastatin (CRESTOR) 40 MG tablet   Oral   Take 1 tablet (40 mg total) by mouth daily.   90 tablet   3   . sacubitril-valsartan (ENTRESTO) 49-51 MG   Oral   Take 1 tablet by mouth 2 (two) times daily.   60 tablet   6   . sertraline (ZOLOFT) 100 MG tablet   Oral   Take 1 tablet (100 mg total) by mouth daily.   30 tablet   2   . EXPIRED: spironolactone (ALDACTONE) 25 MG tablet   Oral   Take by mouth daily.            Allergies Naproxen  Family History  Problem Relation Age of Onset  . Heart attack Mother 42  . Hypertension Mother   . Heart attack Father 73  . Hypertension Father   . Hypertension Maternal Uncle   . Heart disease Maternal Uncle   . Heart disease Maternal Grandmother   . Stroke Maternal Grandmother     Social History Social History  Substance Use Topics  . Smoking status: Current Every Day Smoker -- 0.50 packs/day    Types: Cigarettes  . Smokeless tobacco: Never Used  . Alcohol Use: 0.0 - 0.6 oz/week    0-1 Standard drinks or equivalent per week    Review of Systems Constitutional: No fever/chills Eyes: No visual changes. ENT: No sore throat. Cardiovascular: Denies chest pain. Respiratory: Denies shortness of breath. Gastrointestinal: No abdominal pain.  No nausea, no vomiting.  No diarrhea.  No constipation. Genitourinary: Negative for dysuria. Musculoskeletal: Negative for back pain.  No  neck pain. Mild achiness over the left hip area but reports the hip itself feels okay Skin: Negative for rash. Neurological: Negative for headaches at present, and no focal weakness or numbness.  Test take aspirin and Plavix.  10-point ROS otherwise negative.  ____________________________________________   PHYSICAL EXAM:  VITAL SIGNS: ED Triage Vitals  Enc Vitals Group     BP 11/09/15 2003 120/65 mmHg     Pulse Rate 11/09/15 2003 91     Resp 11/09/15 2003 20     Temp 11/09/15 2003 98.6 F (37 C)     Temp Source 11/09/15 2003 Oral     SpO2 11/09/15 2003 98 %     Weight 11/09/15 2003 290 lb (131.543 kg)     Height 11/09/15 2003 6' (1.829 m)     Head Cir --      Peak Flow --      Pain Score 11/09/15 2004 4     Pain Loc --      Pain Edu? --      Excl. in [place]? --    Constitutional: Alert and oriented. Well appearing and in no acute distress. Eyes: Conjunctivae are normal. PERRL. EOMI. Head: Atraumatic to for some very mild tenderness over the left posterior parietal region without evidence of hematoma. Nose: No congestion/rhinnorhea. Mouth/Throat: Mucous membranes are moist.  Oropharynx non-erythematous. Neck: No stridor.  No cervical spine tenderness Cardiovascular: Normal rate, regular rhythm. Grossly normal heart sounds.  Good peripheral circulation. Respiratory: Normal respiratory effort.  No retractions. Lungs CTAB. Gastrointestinal: Soft and nontender. No distention. Musculoskeletal:   RIGHT Right upper extremity demonstrates normal strength, good use of all muscles. No edema bruising or contusions of the right shoulder/upper arm, right elbow, right forearm / hand. Full range of motion of the right right upper extremity without pain. No evidence of trauma. Strong radial pulse. Intact median/ulnar/radial neuro-muscular exam.  LEFT Left upper extremity demonstrates normal strength, good use of all muscles. No edema bruising or contusions of the left shoulder/upper arm,  left elbow, left forearm / hand. Full range of motion of the left  upper extremity without pain. No evidence of trauma. Strong radial pulse. Intact median/ulnar/radial neuro-muscular exam.  Lower Extremities  No edema. Normal DP/PT pulses bilateral with good cap refill.  Normal neuro-motor function lower extremities bilateral.  RIGHT Right lower extremity demonstrates normal strength, good use of all muscles. No edema bruising or contusions of the right hip, right knee, right ankle. Full range of motion of the right lower extremity without pain. No pain on axial loading. No evidence of trauma.  LEFT Left lower extremity demonstrates normal strength, good use of all muscles. No edema bruising or contusions of the hip,  knee, ankle. Full range of motion of the left lower extremity without pain. No pain on axial loading. No evidence of trauma. Strong dorsalis pedis pulse. Patient has just minimal tenderness over the left buttock without bruising or contusion noted.   Neurologic:  Normal speech and language. No gross focal neurologic deficits are appreciated. Patient able to stand and walk with good ambulation in minimal discomfort. Skin:  Skin is warm, dry and intact. No rash noted. Psychiatric: Mood and affect are normal. Speech and behavior are normal.  ____________________________________________   LABS (all labs ordered are listed, but only abnormal results are displayed)  Labs Reviewed  GLUCOSE, CAPILLARY - Abnormal; Notable for the following:    Glucose-Capillary 130 (*)    All other components within normal limits  CBG MONITORING, ED   ____________________________________________  EKG   ____________________________________________  RADIOLOGY   DG HIP UNILAT WITH PELVIS 2-3 VIEWS LEFT (Final result) Result time: 11/09/15 [id number]   Final result by Rad Results In Interface (11/09/15 20:42:32)   Narrative:   CLINICAL DATA: [place] in house today. Left hip injury and  pain. Limping. Initial encounter.  EXAM: DG HIP (WITH OR WITHOUT PELVIS) 2-3V LEFT  COMPARISON: None.  FINDINGS: There is no evidence of hip fracture or dislocation. Mild degenerative spurring of the left acetabulum seen without joint space narrowing. No other significant bone abnormality identified.  IMPRESSION: No acute findings. Mild left hip degenerative spurring.   Electronically Signed By: Earle Gell M.D. On: 11/09/2015 20:42          CT Head Wo Contrast (Final result) Result time: 11/09/15 20:27:01   Final result by Rad Results In Interface (11/09/15 20:27:01)   Narrative:   CLINICAL DATA: Patient fell inside the house today while picking up dog. Bruise on left side of scalp.  EXAM: CT HEAD WITHOUT CONTRAST  TECHNIQUE: Contiguous axial images were obtained from the base of the skull through the vertex without intravenous contrast.  COMPARISON: None.  FINDINGS: There is no evidence for acute hemorrhage, hydrocephalus, mass lesion, or abnormal extra-axial fluid collection. No definite CT evidence for acute infarction. The visualized paranasal sinuses and mastoid air cells are clear. No evidence for skull fracture.  IMPRESSION: Normal exam.   Electronically Signed By: [name] M.D. On: 11/09/2015 20:27    ____________________________________________   PROCEDURES  Procedure(s) performed: None  Critical Care performed: No  ____________________________________________   INITIAL IMPRESSION / ASSESSMENT AND PLAN / ED COURSE  Pertinent labs & imaging results that were available during my care of the patient were reviewed by me and considered in my medical decision making (see chart for details).  Patient presents for evaluation after a accidental fall wall reaching for his dog. He did strike his head and had a mild headache, which is what prompted evaluation as he called nurse line. He has no significant evidence of traumatic  injury, now was a low level fall however he does take anticoagulants a CT scan was performed which does not demonstrate intracranial injury.  He did not lose consciousness. He does have just some mild residual headache, and I suspect he may have just a mild head injury or slight concussion. In addition the patient's left hip has evidence of mild contusion by examination and tenderness, however the hip joint itself is range well without evidence of pain and he is able to ambulate on it. I doubt clinically significant injury to the actual hip itself.  I will prescribe the patient a narcotic pain medicine due to their condition which I anticipate will cause at least moderate pain short term. I discussed with the patient safe use of narcotic pain medicines, and that they are not to drive, work in dangerous areas, or ever take more than prescribed (no more than 1 pill every 6 hours). We discussed that this is the type of medication that can be  overdosed on and the risks of this type of medicine. Patient is very agreeable to only use as prescribed and to never use more than prescribed.  Return precautions and treatment recommendations and follow-up discussed with the patient who is agreeable with the plan.  ____________________________________________   FINAL CLINICAL IMPRESSION(S) / ED DIAGNOSES  Final diagnoses:  Closed head injury, initial encounter  Contusion of left hip, initial encounter  Delman Kitten, MD 11/09/15 [phone]

## 2015-11-09 NOTE — ED Notes (Addendum)
Pt fell inside the house today when picking up his dog.  Pt struck the night stand.  No loc.  No vomiting.  Pt taking plavix.  Small bruise noted on left side of scalp.  Pt reports slight headache.   Pt alert and speech is clear.  Pt also has left hip pain.  Ambulating with a limp.

## 2015-11-09 NOTE — Discharge Instructions (Signed)
You were seen in the Emergency Department (ED) today for a head injury.  Based on your evaluation, you may have sustained a concussion (or bruise) to your brain.  CT scan done, it did not show any evidence of serious injury or bleeding.    Symptoms to expect from a concussion include nausea, mild to moderate headache, difficulty concentrating or sleeping, and mild lightheadedness.  These symptoms should improve over the next few days to weeks, but it may take many weeks before you feel back to normal.  Return to the emergency department or follow-up with your primary care doctor if your symptoms are not improving over this time.  Signs of a more serious head injury include vomiting, severe headache, excessive sleepiness or confusion, and weakness or numbness in your face, arms or legs.  Return immediately to the Emergency Department if you experience any of these more concerning symptoms.     Contusion A contusion is a deep bruise. Contusions are the result of a blunt injury to tissues and muscle fibers under the skin. The injury causes bleeding under the skin. The skin overlying the contusion may turn blue, purple, or yellow. Minor injuries will give you a painless contusion, but more severe contusions may stay painful and swollen for a few weeks.  CAUSES  This condition is usually caused by a blow, trauma, or direct force to an area of the body. SYMPTOMS  Symptoms of this condition include:  Swelling of the injured area.  Pain and tenderness in the injured area.  Discoloration. The area may have redness and then turn blue, purple, or yellow. DIAGNOSIS  This condition is diagnosed based on a physical exam and medical history. An X-ray, CT scan, or MRI may be needed to determine if there are any associated injuries, such as broken bones (fractures). TREATMENT  Specific treatment for this condition depends on what area of the body was injured. In general, the best treatment for a contusion is  resting, icing, applying pressure to (compression), and elevating the injured area. This is often called the RICE strategy. Over-the-counter anti-inflammatory medicines may also be recommended for pain control.  HOME CARE INSTRUCTIONS   Rest the injured area.  If directed, apply ice to the injured area:  Put ice in a plastic bag.  Place a towel between your skin and the bag.  Leave the ice on for 20 minutes, 2-3 times per day.  If directed, apply light compression to the injured area using an elastic bandage. Make sure the bandage is not wrapped too tightly. Remove and reapply the bandage as directed by your health care provider.  If possible, raise (elevate) the injured area above the level of your heart while you are sitting or lying down.  Take over-the-counter and prescription medicines only as told by your health care provider. SEEK MEDICAL CARE IF:  Your symptoms do not improve after several days of treatment.  Your symptoms get worse.  You have difficulty moving the injured area. SEEK IMMEDIATE MEDICAL CARE IF:   You have severe pain.  You have numbness in a hand or foot.  Your hand or foot turns pale or cold.   This information is not intended to replace advice given to you by your health care provider. Make sure you discuss any questions you have with your health care provider.   Document Released: 06/13/2005 Document Revised: 05/25/2015 Document Reviewed: 01/19/2015 Elsevier Interactive Patient Education 2016 [place] Injury, Adult You have a head injury. Headaches  and throwing up (vomiting) are common after a head injury. It should be easy to wake up from sleeping. Sometimes you must stay in the hospital. Most problems happen within the first 24 hours. Side effects may occur up to 7-10 days after the injury.  WHAT ARE THE TYPES OF HEAD INJURIES? Head injuries can be as minor as a bump. Some head injuries can be more severe. More severe head injuries  include:  A jarring injury to the brain (concussion).  A bruise of the brain (contusion). This mean there is bleeding in the brain that can cause swelling.  A cracked skull (skull fracture).  Bleeding in the brain that collects, clots, and forms a bump (hematoma). WHEN SHOULD I GET HELP RIGHT AWAY?   You are confused or sleepy.  You cannot be woken up.  You feel sick to your stomach (nauseous) or keep throwing up (vomiting).  Your dizziness or unsteadiness is getting worse.  You have very bad, lasting headaches that are not helped by medicine. Take medicines only as told by your doctor.  You cannot use your arms or legs like normal.  You cannot walk.  You notice changes in the black spots in the center of the colored part of your eye (pupil).  You have clear or bloody fluid coming from your nose or ears.  You have trouble seeing. During the next 24 hours after the injury, you must stay with someone who can watch you. This person should get help right away (call 911 in the [place] if you start to shake and are not able to control it (have seizures), you pass out, or you are unable to wake up. HOW CAN I PREVENT A HEAD INJURY IN THE FUTURE?  Wear seat belts.  Wear a helmet while bike riding and playing sports like football.  Stay away from dangerous activities around the house. WHEN CAN I RETURN TO NORMAL ACTIVITIES AND ATHLETICS? See your doctor before doing these activities. You should not do normal activities or play contact sports until 1 week after the following symptoms have stopped:  Headache that does not go away.  Dizziness.  Poor attention.  Confusion.  Memory problems.  Sickness to your stomach or throwing up.  Tiredness.  Fussiness.  Bothered by bright lights or loud noises.  Anxiousness or depression.  Restless sleep. MAKE SURE YOU:   Understand these instructions.  Will watch your condition.  Will get help right away if you are not doing  well or get worse.   This information is not intended to replace advice given to you by your health care provider. Make sure you discuss any questions you have with your health care provider.   Document Released: 08/16/2008 Document Revised: 09/24/2014 Document Reviewed: 05/11/2013 Elsevier Interactive Patient Education [employer].

## 2015-11-28 ENCOUNTER — Ambulatory Visit: Payer: BLUE CROSS/BLUE SHIELD | Admitting: Family Medicine

## 2015-12-02 ENCOUNTER — Ambulatory Visit (INDEPENDENT_AMBULATORY_CARE_PROVIDER_SITE_OTHER): Payer: BLUE CROSS/BLUE SHIELD | Admitting: Cardiovascular Disease

## 2015-12-02 ENCOUNTER — Ambulatory Visit: Payer: BLUE CROSS/BLUE SHIELD | Admitting: Family Medicine

## 2015-12-02 ENCOUNTER — Encounter: Payer: Self-pay | Admitting: Cardiovascular Disease

## 2015-12-02 VITALS — BP 90/60 | HR 78 | Ht 72.0 in | Wt 289.5 lb

## 2015-12-02 DIAGNOSIS — I10 Essential (primary) hypertension: Secondary | ICD-10-CM

## 2015-12-02 DIAGNOSIS — R41840 Attention and concentration deficit: Secondary | ICD-10-CM

## 2015-12-02 DIAGNOSIS — I2510 Atherosclerotic heart disease of native coronary artery without angina pectoris: Secondary | ICD-10-CM | POA: Diagnosis not present

## 2015-12-02 DIAGNOSIS — E785 Hyperlipidemia, unspecified: Secondary | ICD-10-CM

## 2015-12-02 DIAGNOSIS — R0789 Other chest pain: Secondary | ICD-10-CM

## 2015-12-02 DIAGNOSIS — I420 Dilated cardiomyopathy: Secondary | ICD-10-CM

## 2015-12-02 DIAGNOSIS — E118 Type 2 diabetes mellitus with unspecified complications: Secondary | ICD-10-CM

## 2015-12-02 DIAGNOSIS — IMO0002 Reserved for concepts with insufficient information to code with codable children: Secondary | ICD-10-CM

## 2015-12-02 DIAGNOSIS — R002 Palpitations: Secondary | ICD-10-CM | POA: Diagnosis not present

## 2015-12-02 DIAGNOSIS — E1165 Type 2 diabetes mellitus with hyperglycemia: Secondary | ICD-10-CM

## 2015-12-02 DIAGNOSIS — Z95810 Presence of automatic (implantable) cardiac defibrillator: Secondary | ICD-10-CM

## 2015-12-02 MED ORDER — EZETIMIBE 10 MG PO TABS: 10.0000 mg | ORAL_TABLET | Freq: Every day | ORAL | Status: DC

## 2015-12-02 NOTE — Assessment & Plan Note (Signed)
Etiology of his symptoms is unclear Poor sleep hygiene which may be contributing to his symptoms He is concerned about a primary neurologic issue, requesting further assistance with workup Suggested he could talk with neurology, establish a baseline Unable to exclude depression Stressed the importance of proper sleep   Total encounter time more than 25 minutes  Greater than 50% was spent in counseling and coordination of care with the patient

## 2015-12-02 NOTE — Assessment & Plan Note (Signed)
Currently with no symptoms of angina. No further workup at this time.  Recommended smoking cessation We will add Zetia

## 2015-12-02 NOTE — Assessment & Plan Note (Signed)
Encouraged him to stay on his Crestor, will add zetia Currently above goal

## 2015-12-02 NOTE — Assessment & Plan Note (Signed)
Blood pressure low on today's visit, he denies any symptoms concerning for orthostasis. No changes made to his medications

## 2015-12-02 NOTE — Patient Instructions (Addendum)
You are doing well.  Please start zetia one a day for cholesterol  Please call us if you have new issues that need to be addressed before your next appt.  Your physician wants you to follow-up in: 6 months.  You will receive a reminder letter in the mail two months in advance. If you don't receive a letter, please call our office to schedule the follow-up appointment.  [facility] [address], [place]  [facility] [place] [address], [address], [place]

## 2015-12-02 NOTE — Assessment & Plan Note (Signed)
Followed by Dr. Caryl Comes, amiodarone dose decreased recently

## 2015-12-02 NOTE — Assessment & Plan Note (Signed)
No clear signs of fluid overload, weight continues to run high Reports breathing is stable, will continue current diuretic regiment

## 2015-12-02 NOTE — Assessment & Plan Note (Signed)
Biggest issue is his poorly controlled diabetes Stressed importance of weight loss, starting exercise program

## 2015-12-02 NOTE — Progress Notes (Signed)
Patient ID: [name], male    DOB: 07-25-1961, 55 y.o.   MRN: [id number]  HPI Comments: Brian Williams is a pleasant 55 year old gentleman with history of coronary artery disease, long history of smoking, diabetes type 2 poorly controlled with  hemoglobin A1c of 9, followed by endocrine/Dr. Gabriel Carina, prior history of cardiac catheterization 55 dating back to 2002, cardiomyopathy, last echocardiogram March 2016 showing ejection fraction 20-25%, who presents for routine follow-up of his coronary artery disease History of sustained VT, VT ablation in [place] at [facility] of sleep apnea, on CPAP  In follow-up today, his biggest complaint is his memory Feels he is forgetting things, losing attention, poor focus Wife has to continually her mind him about things, his own children have noticed he is forgetful For example he reports when he is driving, often goes wrong direction, forgets to take the turn that he is supposed to [facility] who to talk to about these new symptoms Reports his sleep is poor, sometimes sleeping well, most often sleeping very poorly, only several hours at a time  Denies having any significant change in his symptoms with decreased amiodarone down to 100 mg daily, denies worsening palpitations or tachycardia, no ICD shock/therapy  Rare episodes of orthostasis He continues to smoke off and on, has 3 roommates to continue to smoke Hemoglobin A1c of 9, continues to eat poorly He is trying various diets, low carbohydrate Total cholesterol still above goal, reviewed with the patient on today's visit  EKG on today's visit shows normal sinus rhythm with rate 78 bpm, consider old inferior MI  Other past medical history Previously seen in [place] for his cardiac issues. History dates back to 2002 when he had distal RCA disease. Several catheterizations over the next several years for in-stent restenosis of the distal RCA. Later cath, stent placed to the  LAD. Most recent catheterization appears to be April 2011 showing patent LAD and RCA stent.  Despite this, he has had a decline in his ejection fraction over the past several years requiring defibrillator for ventricular tachycardia. Arrhythmia seems to be controlled on nadolol (was previously on 80 mg twice a day, decreased down to daily secondary to low blood pressure and fatigue)   only had one shock since the device was placed.   seen by [facility] cardiology heart failure/transplant team, [name]. No medication changes at that time. Creatinine was elevated with BUN, improved on subsequent lab work. Etiology, he is unclear.  Previous cholesterol per his notes July 2015 showing total cholesterol 185, HDL 19, triglycerides 800, this was on Crestor 40, fenofibrate      Allergies  Allergen Reactions  . Naproxen Other (See Comments)    Other reaction(s): Other (see comments) Causes hyperactivity Hyperactivity     Current Outpatient Prescriptions on File Prior to Visit  Medication Sig Dispense Refill  . ALPRAZolam (XANAX) 0.5 MG tablet Take 0.5 mg by mouth at bedtime as needed.     Marland Kitchen amiodarone (PACERONE) 200 MG tablet Take 100 mg by mouth daily. Take 1/2 tablet (100 mg) by mouth once daily    . aspirin EC 81 MG tablet Take 81 mg by mouth daily.    . carvedilol (COREG) 12.5 MG tablet Take 12.5 mg by mouth 2 (two) times daily.    . Cholecalciferol (D 2000) 2000 UNITS TABS Take 2,000 Units by mouth daily.     . clopidogrel (PLAVIX) 75 MG tablet Take 75 mg by mouth daily.     . dapagliflozin propanediol (FARXIGA)  5 MG TABS tablet Take 5 mg by mouth.    . fenofibrate (TRICOR) 145 MG tablet Take 145 mg by mouth daily.     . furosemide (LASIX) 40 MG tablet Take 40 mg by mouth daily.     Marland Kitchen gemfibrozil (LOPID) 600 MG tablet Take 600 mg by mouth daily.     Marland Kitchen HYDROcodone-acetaminophen (NORCO/VICODIN) 5-325 MG tablet Take 1 tablet by mouth every 6 (six) hours as needed for moderate pain. 15 tablet 0   . insulin regular human CONCENTRATED (HUMULIN R) 500 UNIT/ML injection Inject 500 Units as directed once daily. Via pump up to 3 ml daily    . nitroGLYCERIN (NITROSTAT) 0.4 MG SL tablet Place 0.4 mg under the tongue every 5 (five) minutes as needed.     . potassium chloride (KLOR-CON M10) 10 MEQ tablet Take 10 mEq by mouth daily.     . rosuvastatin (CRESTOR) 40 MG tablet Take 1 tablet (40 mg total) by mouth daily. 90 tablet 3  . sacubitril-valsartan (ENTRESTO) 49-51 MG Take 1 tablet by mouth 2 (two) times daily. 60 tablet 6  . sertraline (ZOLOFT) 100 MG tablet Take 1 tablet (100 mg total) by mouth daily. 30 tablet 2  . spironolactone (ALDACTONE) 25 MG tablet Take by mouth daily.      No current facility-administered medications on file prior to visit.    Past Medical History  Diagnosis Date  . Coronary artery disease   . Hypertension   . Hyperlipidemia   . CHF (congestive heart failure) ([place])   . Diabetes mellitus without complication ([place])   . MI (myocardial infarction) ([place])   . Sleep apnea   . AICD (automatic cardioverter/defibrillator) present   . Ventricular tachycardia ([place])     s/p RFCA PVCs 2013  . ED (erectile dysfunction)   . Chicken pox   . Depression   . GERD (gastroesophageal reflux disease)   . Heart disease     Past Surgical History  Procedure Laterality Date  . Cardiac electrophysiology study and ablation    . Cardiac catheterization    . Coronary angioplasty with stent placement    . Knee surgery      bilateral knee   . Back surgery    . Cardiac defibrillator placement  10/16/2007    ICD Model number 2207-36 serial number [id number]  . Insert / replace / remove pacemaker    . Ep implantable device N/A 06/16/2015    Procedure: ICD Generator Changeout;  Surgeon: Deboraha Sprang, MD;  Location: [facility];  Service: Cardiovascular;  Laterality: N/A;  . Cholecystectomy  2001    Social History  reports that he has quit smoking. His smoking use included  Cigarettes. He smoked 0.50 packs per day. He has never used smokeless tobacco. He reports that he drinks alcohol. He reports that he does not use illicit drugs.  Family History family history includes Heart attack (age of onset: 61) in his mother; Heart attack (age of onset: 64) in his father; Heart disease in his maternal grandmother and maternal uncle; Hypertension in his father, maternal uncle, and mother; Stroke in his maternal grandmother.   Review of Systems  Constitutional: Negative.   Respiratory: Positive for shortness of breath.   Cardiovascular: Positive for leg swelling.  Gastrointestinal: Negative.   Musculoskeletal: Negative.   Neurological: Negative.        Poor memory, lack of attention and focus  Hematological: Negative.   Psychiatric/Behavioral: Negative.   All other systems reviewed and  are negative.   BP 90/60 mmHg  Pulse 78  Ht 6' (1.829 m)  Wt 289 lb 8 oz (131.316 kg)  BMI 39.25 kg/m2  Physical Exam  Constitutional: He is oriented to person, place, and time. He appears well-developed and well-nourished.  Obese  HENT:  Head: Normocephalic.  Nose: Nose normal.  Mouth/Throat: Oropharynx is clear and moist.  Eyes: Conjunctivae are normal. Pupils are equal, round, and reactive to light.  Neck: Normal range of motion. Neck supple. No JVD present.  Cardiovascular: Normal rate, regular rhythm, S1 normal, S2 normal, normal heart sounds and intact distal pulses.  Exam reveals no gallop and no friction rub.   No murmur heard. Pulmonary/Chest: Effort normal and breath sounds normal. No respiratory distress. He has no wheezes. He has no rales. He exhibits no tenderness.  Abdominal: Soft. Bowel sounds are normal. He exhibits no distension. There is no tenderness.  Musculoskeletal: Normal range of motion. He exhibits no edema or tenderness.  Lymphadenopathy:    He has no cervical adenopathy.  Neurological: He is alert and oriented to person, place, and time.  Coordination normal.  Skin: Skin is warm and dry. No rash noted. No erythema.  Psychiatric: He has a normal mood and affect. His behavior is normal. Judgment and thought content normal.      Assessment and Plan   Nursing note and vitals reviewed.

## 2015-12-06 ENCOUNTER — Other Ambulatory Visit: Payer: Self-pay | Admitting: Family Medicine

## 2015-12-06 MED ORDER — ALPRAZOLAM 0.5 MG PO TABS: 0.5000 mg | ORAL_TABLET | Freq: Every evening | ORAL | Status: DC | PRN

## 2015-12-06 NOTE — Telephone Encounter (Signed)
Pt is requesting a refill on Xanax. Pt's last OV was 12/02/15. Please advise, thanks

## 2015-12-06 NOTE — Telephone Encounter (Signed)
ALPRAZolam (XANAX) 0.5 MG tablet  °

## 2015-12-06 NOTE — Telephone Encounter (Signed)
Medication filled.  

## 2015-12-20 ENCOUNTER — Telehealth: Payer: Self-pay | Admitting: Cardiovascular Disease

## 2015-12-20 NOTE — Telephone Encounter (Signed)
Pt calling asking if we can put in a referral in for Neurology

## 2015-12-20 NOTE — Telephone Encounter (Signed)
Pt is requesting referral to neurology for memory issues. He discussed at last ov and would like assistance in setting up. Do you have a preference for a neuro doc or should I defer back to PCP?

## 2015-12-21 NOTE — Telephone Encounter (Signed)
Left voicemail message and will try cell phone.

## 2015-12-21 NOTE — Telephone Encounter (Signed)
Left voicemail message on cell phone number for patient to call back.

## 2015-12-21 NOTE — Telephone Encounter (Signed)
Instructed patient to go through his PCP for neurology consult and he verbalized understanding. Let him know to call back if he has any further problems or questions.

## 2015-12-21 NOTE — Telephone Encounter (Signed)
Think it may be best if this is managed by Dr. Lacinda Axon in case medications are needed We will forward

## 2015-12-23 ENCOUNTER — Telehealth: Payer: Self-pay | Admitting: Family Medicine

## 2015-12-23 NOTE — Telephone Encounter (Signed)
Please advise for referral? thanks

## 2015-12-23 NOTE — Telephone Encounter (Signed)
Pt called stating he went to his cardiologist pt wanted to get a referral to the neurologist and was told by his cardiologist that his pcp has to refer him. Pt would like to go to [facility] in [place]. Call pt @ [phone]. Thank you!

## 2015-12-23 NOTE — Telephone Encounter (Signed)
Need to know what this is for.

## 2015-12-23 NOTE — Telephone Encounter (Signed)
LVTCB

## 2015-12-26 ENCOUNTER — Other Ambulatory Visit: Payer: Self-pay | Admitting: Family Medicine

## 2015-12-26 DIAGNOSIS — R413 Other amnesia: Secondary | ICD-10-CM

## 2015-12-26 NOTE — Telephone Encounter (Signed)
Referral placed.

## 2015-12-26 NOTE — Telephone Encounter (Signed)
Pt states that he has had memory issues in the last couple of years, but in the last year they have gotten worse to where it is interfering with his daily life. It spoke with cardiology who told him that he needed to be seen by neurology for these particular issues. please advise this referral.

## 2016-01-03 ENCOUNTER — Ambulatory Visit (INDEPENDENT_AMBULATORY_CARE_PROVIDER_SITE_OTHER): Payer: BLUE CROSS/BLUE SHIELD | Admitting: *Deleted

## 2016-01-03 DIAGNOSIS — Z95810 Presence of automatic (implantable) cardiac defibrillator: Secondary | ICD-10-CM

## 2016-01-03 DIAGNOSIS — I420 Dilated cardiomyopathy: Secondary | ICD-10-CM

## 2016-01-03 NOTE — Progress Notes (Signed)
Remote ICD transmission.   

## 2016-01-06 ENCOUNTER — Ambulatory Visit (INDEPENDENT_AMBULATORY_CARE_PROVIDER_SITE_OTHER): Payer: BLUE CROSS/BLUE SHIELD | Admitting: Neurology

## 2016-01-06 ENCOUNTER — Encounter: Payer: Self-pay | Admitting: Neurology

## 2016-01-06 VITALS — BP 104/97 | HR 66 | Resp 18 | Ht 72.0 in | Wt 296.0 lb

## 2016-01-06 DIAGNOSIS — I420 Dilated cardiomyopathy: Secondary | ICD-10-CM | POA: Diagnosis not present

## 2016-01-06 DIAGNOSIS — R41840 Attention and concentration deficit: Secondary | ICD-10-CM

## 2016-01-06 DIAGNOSIS — G4700 Insomnia, unspecified: Secondary | ICD-10-CM | POA: Diagnosis not present

## 2016-01-06 DIAGNOSIS — E1142 Type 2 diabetes mellitus with diabetic polyneuropathy: Secondary | ICD-10-CM | POA: Diagnosis not present

## 2016-01-06 DIAGNOSIS — R413 Other amnesia: Secondary | ICD-10-CM

## 2016-01-06 DIAGNOSIS — G4733 Obstructive sleep apnea (adult) (pediatric): Secondary | ICD-10-CM | POA: Diagnosis not present

## 2016-01-06 DIAGNOSIS — F5104 Psychophysiologic insomnia: Secondary | ICD-10-CM | POA: Insufficient documentation

## 2016-01-06 DIAGNOSIS — G4719 Other hypersomnia: Secondary | ICD-10-CM | POA: Diagnosis not present

## 2016-01-06 DIAGNOSIS — Z9989 Dependence on other enabling machines and devices: Secondary | ICD-10-CM

## 2016-01-06 MED ORDER — MODAFINIL 200 MG PO TABS
200.0000 mg | ORAL_TABLET | Freq: Every day | ORAL | Status: DC
Start: 2016-01-06 — End: 2016-03-27

## 2016-01-06 NOTE — Progress Notes (Signed)
[facility]  PATIENT: Brian Williams DOB: 1961/03/23  REFERRING DOCTOR OR PCP:  Ida Rogue (Cardiology) Thersa Salt (PCP) SOURCE: Patient, office visit notes in EMR, labs and CT reports, CT images on PACS.  _________________________________   HISTORICAL  CHIEF COMPLAINT:  Chief Complaint  Patient presents with  . Memory Loss    Sts. has had difficulty with memory for several yrs. Thought treating osa would help, but memory has not improved despite compliance with CPAP.  Examples--putting peanut butter in the freezer, forgetting conversations with his wife.  Sts. also has intermitten tremor in hands.  Also c/o more difficulty with balance with bending over/fim    HISTORY OF PRESENT ILLNESS:  I had the pleasure seeing you patient, Brian Williams, at [facility] for neurologic consultation regarding his memory loss.  He is a 55 year old man with dilated cardiomyopathy and congestive heart failure who has noted more difficulty with memory for the past several years.    He notes misplacing items, saying wring things, remembering whether or not he has done activities.    He has noted issues x 2 years and family has noted his memory issues more recently.    At times, he is driving and ends up at the wrong place.   Hints don't always help him remember.      Montreal Cognitive Assessment  01/06/2016  Visuospatial/ Executive (0/5) 4  Naming (0/3) 3  Attention: Read list of digits (0/2) 2  Attention: Read list of letters (0/1) 1  Attention: Serial 7 subtraction starting at 100 (0/3) 3  Language: Repeat phrase (0/2) 2  Language : Fluency (0/1) 1  Abstraction (0/2) 2  Delayed Recall (0/5) 4  Orientation (0/6) 6  Total 28  Adjusted Score (based on education) 28     He has dilated cardiomyopathy with low EF% (20-25%).   He has CAD and had an MI during recovery after cholecystectomy.  He has an ICD.       He was found to have obstructive sleep apnea  but CPAP treatment has not helped despite reported good compliance.   His AHI is running at 4.7 according to Brian Williams.     He has been compliant x 2 years.   He has excessive daytime sleepiness but if he takes a nap, he has insomnia that night.    He also has some delayed phase sleep disorder going to bed at 1-2 am many nights unless he is sleepy.     Some nights, he feels he cannot fall asleep when he lays down he gets back out of bed. He feels most comfortable going to bed late at night and getting up later in the morning.  EPWORTH SLEEPINESS SCALE  On a scale of 0 - 3 what is the chance of dozing:  Sitting and Reading:   3 Watching TV:    3 Sitting inactive in a public place: 2  Passenger in car for one hour: 3 Lying down to rest in the afternoon: 3 Sitting and talking to someone: 0 Sitting quietly after lunch:  1 In a car, stopped in traffic:  0  Total (out of 24):   15/24  I personally reviewed the CT scan dated 11/09/2015. It is normal.   REVIEW OF SYSTEMS: Constitutional: No fevers, chills, sweats, or change in appetite Eyes: No visual changes, double vision, eye pain Ear, nose and throat: No hearing loss, ear pain, nasal congestion, sore throat Cardiovascular: No chest pain, palpitations Respiratory: No shortness  of breath at rest or with exertion.   No wheezes GastrointestinaI: No nausea, vomiting, diarrhea, abdominal pain, fecal incontinence Genitourinary: No dysuria, urinary retention or frequency.  No nocturia. Musculoskeletal: No neck pain, back pain Integumentary: No rash, pruritus, skin lesions Neurological: as above Psychiatric: No depression at this time.  No anxiety Endocrine: No palpitations, diaphoresis, change in appetite, change in weigh or increased thirst Hematologic/Lymphatic: No anemia, purpura, petechiae. Allergic/Immunologic: No itchy/runny eyes, nasal congestion, recent allergic reactions, rashes  ALLERGIES: Allergies  Allergen Reactions  .  Naproxen Other (See Comments)    Other reaction(s): Other (see comments) Causes hyperactivity Hyperactivity     HOME MEDICATIONS:  Current outpatient prescriptions:  .  ALPRAZolam (XANAX) 0.5 MG tablet, Take 1 tablet (0.5 mg total) by mouth at bedtime as needed., Disp: 60 tablet, Rfl: 1 .  amiodarone (PACERONE) 200 MG tablet, Take 100 mg by mouth daily. Take 1/2 tablet (100 mg) by mouth once daily, Disp: , Rfl:  .  aspirin EC 81 MG tablet, Take 81 mg by mouth daily., Disp: , Rfl:  .  Cholecalciferol (D 2000) 2000 UNITS TABS, Take 2,000 Units by mouth daily. , Disp: , Rfl:  .  clopidogrel (PLAVIX) 75 MG tablet, Take 75 mg by mouth daily. , Disp: , Rfl:  .  dapagliflozin propanediol (FARXIGA) 5 MG TABS tablet, Take 5 mg by mouth., Disp: , Rfl:  .  ezetimibe (ZETIA) 10 MG tablet, Take 1 tablet (10 mg total) by mouth daily., Disp: 90 tablet, Rfl: 4 .  fenofibrate (TRICOR) 145 MG tablet, Take 145 mg by mouth daily. , Disp: , Rfl:  .  furosemide (LASIX) 40 MG tablet, Take 40 mg by mouth daily. , Disp: , Rfl:  .  gemfibrozil (LOPID) 600 MG tablet, Take 600 mg by mouth daily. , Disp: , Rfl:  .  HYDROcodone-acetaminophen (NORCO/VICODIN) 5-325 MG tablet, Take 1 tablet by mouth every 6 (six) hours as needed for moderate pain., Disp: 15 tablet, Rfl: 0 .  insulin regular human CONCENTRATED (HUMULIN R) 500 UNIT/ML injection, Inject 500 Units as directed once daily. Via pump up to 3 ml daily, Disp: , Rfl:  .  nitroGLYCERIN (NITROSTAT) 0.4 MG SL tablet, Place 0.4 mg under the tongue every 5 (five) minutes as needed. , Disp: , Rfl:  .  potassium chloride (KLOR-CON M10) 10 MEQ tablet, Take 10 mEq by mouth daily. , Disp: , Rfl:  .  rosuvastatin (CRESTOR) 40 MG tablet, Take 1 tablet (40 mg total) by mouth daily., Disp: 90 tablet, Rfl: 3 .  sacubitril-valsartan (ENTRESTO) 49-51 MG, Take 1 tablet by mouth 2 (two) times daily., Disp: 60 tablet, Rfl: 6 .  sertraline (ZOLOFT) 100 MG tablet, Take 1 tablet (100 mg  total) by mouth daily., Disp: 30 tablet, Rfl: 2 .  carvedilol (COREG) 12.5 MG tablet, Take 12.5 mg by mouth 2 (two) times daily., Disp: , Rfl:  .  modafinil (PROVIGIL) 200 MG tablet, Take 1 tablet (200 mg total) by mouth daily., Disp: 30 tablet, Rfl: 5 .  spironolactone (ALDACTONE) 25 MG tablet, Take by mouth daily. , Disp: , Rfl:   PAST MEDICAL HISTORY: Past Medical History  Diagnosis Date  . Coronary artery disease   . Hypertension   . Hyperlipidemia   . CHF (congestive heart failure) ([place])   . Diabetes mellitus without complication ([place])   . MI (myocardial infarction) ([place])   . Sleep apnea   . AICD (automatic cardioverter/defibrillator) present   . Ventricular tachycardia ([place])  s/p RFCA PVCs 2013  . ED (erectile dysfunction)   . Chicken pox   . Depression   . GERD (gastroesophageal reflux disease)   . Heart disease     PAST SURGICAL HISTORY: Past Surgical History  Procedure Laterality Date  . Cardiac electrophysiology study and ablation    . Cardiac catheterization    . Coronary angioplasty with stent placement    . Knee surgery      bilateral knee   . Back surgery    . Cardiac defibrillator placement  10/16/2007    ICD Model number 2207-36 serial number [id number]  . Insert / replace / remove pacemaker    . Ep implantable device N/A 06/16/2015    Procedure: ICD Generator Changeout;  Surgeon: Deboraha Sprang, MD;  Location: [facility];  Service: Cardiovascular;  Laterality: N/A;  . Cholecystectomy  2001    FAMILY HISTORY: Family History  Problem Relation Age of Onset  . Heart attack Mother 12  . Hypertension Mother   . Heart attack Father 24  . Hypertension Father   . Hypertension Maternal Uncle   . Heart disease Maternal Uncle   . Heart disease Maternal Grandmother   . Stroke Maternal Grandmother     SOCIAL HISTORY:  Social History   Social History  . Marital Status: Married    Spouse Name: N/A  . Number of Children: N/A  . Years of Education:  N/A   Occupational History  . Not on file.   Social History Main Topics  . Smoking status: Former Smoker -- 0.50 packs/day    Types: Cigarettes  . Smokeless tobacco: Never Used  . Alcohol Use: 0.0 - 0.6 oz/week    0-1 Standard drinks or equivalent per week  . Drug Use: No  . Sexual Activity: Not on file   Other Topics Concern  . Not on file   Social History Narrative   Married.   Moved from [place].   Disabled.        PHYSICAL EXAM  Filed Vitals:   01/06/16 1037  BP: 104/97  Pulse: 66  Resp: 18  Height: 6' (1.829 m)  Weight: 296 lb (134.265 kg)    Body mass index is 40.14 kg/(m^2).   General: The patient is well-developed and well-nourished and in no acute distress  Eyes:  Funduscopic exam shows normal optic discs and retinal vessels.  Neck: The neck is supple, no carotid bruits are noted.  The neck is nontender.  Cardiovascular: The heart has a regular rate and rhythm with a normal S1 and S2. There were no murmurs, gallops or rubs. Lungs are clear to auscultation.  Skin: Extremities are without significant edema.  Musculoskeletal:  Back is nontender  Neurologic Exam  Mental status: The patient is alert and oriented x 3 at the time of the examination. The patient has apparent normal recent and remote memory, with an apparently normal attention span and concentration ability.   Speech is normal.  Cranial nerves: Extraocular movements are full. Pupils are equal, round, and reactive to light and accomodation.  Visual fields are full.  Facial symmetry is present. There is good facial sensation to soft touch bilaterally.Facial strength is normal.  Trapezius and sternocleidomastoid strength is normal. No dysarthria is noted.  The tongue is midline, and the patient has symmetric elevation of the soft palate. No obvious hearing deficits are noted.  Motor:  Muscle bulk is normal.   Tone is normal. Strength is  5 / 5 in all  4 extremities.   Sensory: Sensory testing  is intact to pinprick, soft touch and vibration sensation in the arms but he has reduced vibratory sensation in his toes. Touch sensation is more normal in the toes..  Coordination: Cerebellar testing reveals good finger-nose-finger and heel-to-shin bilaterally.  Gait and station: Station is normal.   Gait is normal. Tandem gait is mildly wide. Romberg is negative.   Reflexes: Deep tendon reflexes are symmetric and normal bilaterally.   Plantar responses are flexor.    DIAGNOSTIC DATA (LABS, IMAGING, TESTING) - I reviewed patient records, labs, notes, testing and imaging myself where available.  Lab Results  Component Value Date   WBC 8.1 06/03/2015   HCT 36.7* 06/03/2015   MCV 92 06/03/2015   PLT 202 06/03/2015      Component Value Date/Time   NA 139 06/03/2015 1154   K 4.3 06/03/2015 1154   CL 98 06/03/2015 1154   CO2 18 06/03/2015 1154   GLUCOSE 143* 06/03/2015 1154   BUN 15 06/03/2015 1154   CREATININE 0.90 06/03/2015 1154   CALCIUM 10.0 06/03/2015 1154   PROT 7.0 06/03/2015 1154   ALBUMIN 4.5 06/03/2015 1154   AST 24 06/03/2015 1154   ALT 24 06/03/2015 1154   ALKPHOS 49 06/03/2015 1154   BILITOT 0.3 06/03/2015 1154   GFRNONAA 96 06/03/2015 1154   GFRAA 112 06/03/2015 1154   Lab Results  Component Value Date   CHOL 171 10/06/2015   HDL 20.50* 10/06/2015   LDLDIRECT 65.0 10/06/2015   TRIG * 10/06/2015    574.0 Triglyceride is over 400; calculations on Lipids are invalid.   CHOLHDL 8 10/06/2015   No results found for: HGBA1C No results found for: VITAMINB12 Lab Results  Component Value Date   TSH 3.210 06/03/2015       ASSESSMENT AND PLAN  Memory disturbance  Attention and concentration deficit  Congestive dilated cardiomyopathy (HCC)  OSA on CPAP  Excessive daytime sleepiness  Diabetic polyneuropathy associated with type 2 diabetes mellitus ([place])  Insomnia    In summary, Brody Hillery is a 55 year old man with memory disturbance who also  has cardiomyopathy with low ejection fraction and obstructive sleep apnea.   His performance on examination today is not consistent with a primary memory disturbance such as Alzheimer's. I believe his memory difficulties are mostly due to poor attention and focus.   His medical conditions likely contribute to his memory issues, but both the CHF and the OSA appear to be optimally controlled.      He is very knowledgeable about his CPAP use andhis compliance and AHI history. He will continue to use CPAP on a nightly basis. I will add Provigil 200 mg in the morning to see if this can help his attention and focus better.  If this is not helpful, consider a low-dose stimulant.   Additionally, we discussed sleep hygiene issues. He will try to go to bed at a more and take 1 melatonin about 30 minutes before bedtime to help synchronize his day night cycles.  He also appears to have a mild diabetic polyneuropathy numbness auditory sensation in the toes. This is not severe enough to affect function and is not painful to him so no treatment is necessary at this time. I emphasized good diabetic control.  He will return to see me in 4 months but call sooner if he notes new or worsening neurologic issues.  Thank you for asking me to see Brian Williams for a neurologic consultation. Please let  me know if I can be of further assistance with her or other patients in the future.    Richard A. Felecia Shelling, MD, PhD [id number], [id number] AM Certified in Neurology, Clinical Neurophysiology, Sleep Medicine, Pain Medicine and Neuroimaging  [facility] [address] [place], [place] [address] [phone]

## 2016-01-17 ENCOUNTER — Encounter: Payer: Self-pay | Admitting: *Deleted

## 2016-01-17 ENCOUNTER — Telehealth: Payer: Self-pay | Admitting: Neurology

## 2016-01-17 NOTE — Telephone Encounter (Signed)
I have spoken with Maki--sleep study really needs to be submitted this week--he will contact hospital where study was done and request copy be faxed to my attn fax # [phone]

## 2016-01-17 NOTE — Telephone Encounter (Signed)
Brian Williams with [employer] called and needs the sleep study to help with the process of PA on medication modafinil (PROVIGIL) 200 MG tablet. Please call [phone] fax [phone]

## 2016-01-17 NOTE — Telephone Encounter (Signed)
Noted.  Will watch for it/fim

## 2016-01-17 NOTE — Telephone Encounter (Signed)
Patient returned Faith's call °

## 2016-01-17 NOTE — Telephone Encounter (Signed)
Patient called back to advise he was able to get copy of sleep study and will be sending to our office.

## 2016-01-17 NOTE — Telephone Encounter (Signed)
[place].  I need a copy of his sleep study.  Not sure when or where it was done.  Insurance will not approve Modafinil without it/fim

## 2016-01-17 NOTE — Telephone Encounter (Signed)
Called pt. Relayed Faith's message below.  He stated he had the sleep study at [facility] in [place], [place]. He thinks it was about 3 years ago. He could not remember the doctors name off the top of his head. He is going to send name of MD through [employer]. The name was in his phone but could not look while he was on the phone with me. He would like release form mailed to him to be able to request records. He will fax back to our office. I advised we will call him if we need anything further. He verbalized understanding.   He did not have a fax machine for me to fax release form to.

## 2016-01-18 ENCOUNTER — Telehealth: Payer: Self-pay | Admitting: Neurology

## 2016-01-18 NOTE — Telephone Encounter (Signed)
Sleep study received and faxed to [facility] fax # [phone], ref# [id number]/fim

## 2016-01-18 NOTE — Telephone Encounter (Signed)
Pt called sts he faxed 11 pages yesterday of information requested from [place] but he thinks only 3 pages went thru.  Please call pt with status of rec'd faxes

## 2016-01-18 NOTE — Telephone Encounter (Signed)
Sleep study received and faxed to [facility] fax # [phone], with ref # [id number] noted on fax/fim

## 2016-01-18 NOTE — Telephone Encounter (Signed)
LMOM that he is correct--only 3 pages of sleep study came thru.  I have submitted what was available to [place] (first 3 pages did indicate documented ahi's, hypoxemia), but it would be helpful to have the entire study.  If he wold refax to [id number]

## 2016-01-18 NOTE — Telephone Encounter (Signed)
Sam/[employer] [phone] called said PA request formodafinil (PROVIGIL) 200 MG tablet was received but she said they are missing records for sleep study. Please fax to [phone] REF # [id number].

## 2016-01-20 ENCOUNTER — Encounter: Payer: Self-pay | Admitting: *Deleted

## 2016-01-23 ENCOUNTER — Other Ambulatory Visit: Payer: Self-pay | Admitting: *Deleted

## 2016-01-23 ENCOUNTER — Telehealth: Payer: Self-pay | Admitting: Cardiovascular Disease

## 2016-01-23 DIAGNOSIS — F32A Depression, unspecified: Secondary | ICD-10-CM

## 2016-01-23 DIAGNOSIS — F329 Major depressive disorder, single episode, unspecified: Secondary | ICD-10-CM

## 2016-01-23 DIAGNOSIS — F419 Anxiety disorder, unspecified: Secondary | ICD-10-CM

## 2016-01-23 MED ORDER — SPIRONOLACTONE 25 MG PO TABS: 25.0000 mg | ORAL_TABLET | Freq: Every day | ORAL | Status: DC

## 2016-01-23 MED ORDER — SERTRALINE HCL 100 MG PO TABS: 100.0000 mg | ORAL_TABLET | Freq: Every day | ORAL | Status: DC

## 2016-01-23 NOTE — Telephone Encounter (Signed)
Medication refill request for Sertraline [facility] [place]

## 2016-01-23 NOTE — Telephone Encounter (Signed)
°*  STAT* If patient is at the pharmacy, call can be transferred to refill team.   1. Which medications need to be refilled? (please list name of each medication and dose if known) Spironolactone   2. Which pharmacy/location (including street and city if local pharmacy) is [facility] [place]   3. Do they need a 30 day or 90 day supply? 90 day

## 2016-01-23 NOTE — Telephone Encounter (Signed)
I have spoken with Lennette Bihari, and let him know that I spoken with Thurmond Butts at [facility] says they show that Modafinil has not been approved.  I have faxed copy of approval letter from [employer], to [place] at fax # [phone]./fim

## 2016-01-23 NOTE — Telephone Encounter (Signed)
Please advise as you have not filled this medication for this patient. thanks

## 2016-01-23 NOTE — Telephone Encounter (Signed)
Patient called back, states Brian Williams called Friday to advise medication was approved, when he went to pick it up from Pharmacy today, they advised that they haven't gotten the approval yet. They need to hear from someone that it has been approved.

## 2016-01-23 NOTE — Telephone Encounter (Signed)
Refill sent for Spironolactone 25 mg  

## 2016-02-10 ENCOUNTER — Encounter: Payer: Self-pay | Admitting: Cardiology

## 2016-02-10 LAB — CUP PACEART REMOTE DEVICE CHECK
Date Time Interrogation Session: 20170526112117
HighPow Impedance: 44 Ohm
Implantable Lead Implant Date: 20160929
Implantable Lead Implant Date: 20160929
Implantable Lead Location: 753859
Implantable Lead Location: 753860
Implantable Lead Model: 7071
Lead Channel Impedance Value: 380 Ohm
Lead Channel Impedance Value: 450 Ohm
Lead Channel Sensing Intrinsic Amplitude: 10.9 mV
Lead Channel Sensing Intrinsic Amplitude: 4.4 mV
Lead Channel Setting Pacing Amplitude: 2 V
Lead Channel Setting Pacing Amplitude: 2.5 V
Lead Channel Setting Pacing Pulse Width: 0.4 ms
Lead Channel Setting Sensing Sensitivity: 0.5 mV
Pulse Gen Serial Number: 7306375

## 2016-02-14 ENCOUNTER — Encounter: Payer: Self-pay | Admitting: Cardiovascular Disease

## 2016-02-17 ENCOUNTER — Encounter: Payer: Self-pay | Admitting: Family Medicine

## 2016-02-17 ENCOUNTER — Other Ambulatory Visit: Payer: Self-pay | Admitting: Family Medicine

## 2016-02-17 ENCOUNTER — Telehealth: Payer: Self-pay | Admitting: Family Medicine

## 2016-02-17 MED ORDER — GEMFIBROZIL 600 MG PO TABS: 600.0000 mg | ORAL_TABLET | Freq: Every day | ORAL | Status: DC

## 2016-02-17 NOTE — Telephone Encounter (Signed)
Please advise, thanks.

## 2016-02-17 NOTE — Telephone Encounter (Signed)
[facility] [phone] [facility] regarding a drug interaction with rosuvastatin (CRESTOR) 40 MG tablet and gemfibrozil (LOPID) 600 MG tablet. She said it's popping up on her screen as a level 1. Thank you!

## 2016-02-17 NOTE — Telephone Encounter (Signed)
Historical medication last refilled by Dr.Gollan. Please advise?

## 2016-02-17 NOTE — Telephone Encounter (Signed)
Patient fine to take. Managed/followed by cardiology.

## 2016-02-17 NOTE — Telephone Encounter (Signed)
Spoke with the pharmacy, relayed the message and spoke with Lattie Haw.

## 2016-02-21 ENCOUNTER — Encounter: Payer: BLUE CROSS/BLUE SHIELD | Admitting: Internal Medicine

## 2016-03-13 ENCOUNTER — Telehealth: Payer: Self-pay | Admitting: Internal Medicine

## 2016-03-13 NOTE — Telephone Encounter (Signed)
Lmov on home and cell phone number to call back and reschedule this appointment.

## 2016-03-13 NOTE — Telephone Encounter (Signed)
-----   Message from Jeannette How sent at 03/12/2016  2:05 PM EDT ----- Can we move to this week for device check since it was scheduled on non-device day. Thanks

## 2016-03-13 NOTE — Telephone Encounter (Signed)
Pt called back, pt is coming 03/27/16 to see Dr Caryl Comes

## 2016-03-22 ENCOUNTER — Encounter: Payer: BLUE CROSS/BLUE SHIELD | Admitting: Internal Medicine

## 2016-03-25 ENCOUNTER — Other Ambulatory Visit: Payer: Self-pay | Admitting: Family Medicine

## 2016-03-26 NOTE — Telephone Encounter (Signed)
Refilled 12/06/15. Last seen 10/06/15. Please advise?

## 2016-03-26 NOTE — Telephone Encounter (Signed)
Faxed

## 2016-03-27 ENCOUNTER — Telehealth: Payer: Self-pay | Admitting: Neurology

## 2016-03-27 ENCOUNTER — Ambulatory Visit (INDEPENDENT_AMBULATORY_CARE_PROVIDER_SITE_OTHER): Payer: BLUE CROSS/BLUE SHIELD | Admitting: Internal Medicine

## 2016-03-27 ENCOUNTER — Encounter: Payer: Self-pay | Admitting: Internal Medicine

## 2016-03-27 VITALS — BP 114/71 | Ht 72.0 in | Wt 291.5 lb

## 2016-03-27 DIAGNOSIS — I255 Ischemic cardiomyopathy: Secondary | ICD-10-CM | POA: Diagnosis not present

## 2016-03-27 DIAGNOSIS — I472 Ventricular tachycardia, unspecified: Secondary | ICD-10-CM

## 2016-03-27 DIAGNOSIS — Z95810 Presence of automatic (implantable) cardiac defibrillator: Secondary | ICD-10-CM

## 2016-03-27 DIAGNOSIS — I493 Ventricular premature depolarization: Secondary | ICD-10-CM

## 2016-03-27 DIAGNOSIS — I5022 Chronic systolic (congestive) heart failure: Secondary | ICD-10-CM | POA: Diagnosis not present

## 2016-03-27 MED ORDER — CARVEDILOL 12.5 MG PO TABS: 12.5000 mg | ORAL_TABLET | Freq: Two times a day (BID) | ORAL | Status: DC

## 2016-03-27 NOTE — Patient Instructions (Addendum)
Medication Instructions: - Your physician has recommended you make the following change in your medication:  1) Stop amiodarone 2) Increase coreg (carvedilol) to 25 mg one tablet by mouth twice daily  Labwork: - none  Procedures/Testing: - none  Follow-Up: - Your physician recommends that you schedule a follow-up appointment in: 4 weeks with Dr. Rockey Situ.  - Remote monitoring is used to monitor your Pacemaker of ICD from home. This monitoring reduces the number of office visits required to check your device to one time per year. It allows [place] to keep an eye on the functioning of your device to ensure it is working properly. You are scheduled for a device check from home on 06/26/16. You may send your transmission at any time that day. If you have a wireless device, the transmission will be sent automatically. After your physician reviews your transmission, you will receive a postcard with your next transmission date.  - Your physician wants you to follow-up in: 1 year with Dr. Caryl Comes. You will receive a reminder letter in the mail two months in advance. If you don't receive a letter, please call our office to schedule the follow-up appointment.  Any Additional Special Instructions Will Be Listed Below (If Applicable).     If you need a refill on your cardiac medications before your next appointment, please call your pharmacy.

## 2016-03-27 NOTE — Progress Notes (Signed)
Patient Care Team: [name], DO as PCP - General (Family Medicine) Minna Merritts, MD as Consulting Physician (Cardiology) Randolm Idol as Consulting Physician (Cardiology)   HPI  Brian Williams is a 55 y.o. male Seen in follow-up for ICD implanted for secondary prevention after holter demonstrated VT . His device reached ERI and underwent generator replacement 9/16. He has an underlying history of ischemic heart disease.  He has had multiple caths and PCI  Followed previously at [facility] and then at [facility] and Results Reviewed   outpatient records demonstrated a Myoview 2015 ejection fraction 35% Last catheterization was 2011demonstrating persistent disease but not amenable to PCI  He is started to notice chest tightness. Episodes last 15 minutes or so. They are not clearly related to exertion. they're reminiscent of previous episodes of angina.  He has trivial edema.  He has noted no change in his palpitations following the decreasing of his amiodarone.  Device History: ICD implanted  2009 History of appropriate therapy: No History of AAD therapy: Yes  Review of the old records also demonstrates that he underwent catheter ablation for VT and PVCs in 2013. Records also suggest that there was a history of complex ventricular ectopy but no sustained ventricular arrhythmias.   He hhas been seen intercurrently by [facility] and these records were reviewed  Nadolol>> carvedilol   Recommendations for uptitration has not yet been completed   Past Medical History  Diagnosis Date  . Coronary artery disease   . Hypertension   . Hyperlipidemia   . CHF (congestive heart failure) ([place])   . Diabetes mellitus without complication ([place])   . MI (myocardial infarction) ([place])   . Sleep apnea   . AICD (automatic cardioverter/defibrillator) present   . Ventricular tachycardia ([place])     s/p RFCA PVCs 2013  . ED (erectile dysfunction)   . Chicken pox   . Depression   . GERD  (gastroesophageal reflux disease)   . Heart disease     Past Surgical History  Procedure Laterality Date  . Cardiac electrophysiology study and ablation    . Cardiac catheterization    . Coronary angioplasty with stent placement    . Knee surgery      bilateral knee   . Back surgery    . Cardiac defibrillator placement  10/16/2007    ICD Model number 2207-36 serial number [id number]  . Insert / replace / remove pacemaker    . Ep implantable device N/A 06/16/2015    Procedure: ICD Generator Changeout;  Surgeon: Deboraha Sprang, MD;  Location: [facility];  Service: Cardiovascular;  Laterality: N/A;  . Cholecystectomy  2001    Current Outpatient Prescriptions  Medication Sig Dispense Refill  . ALPRAZolam (XANAX) 0.5 MG tablet TAKE 1 TABLET BY MOUTH AT BEDTIME AS NEEDED 60 tablet 1  . amiodarone (PACERONE) 200 MG tablet Take 100 mg by mouth daily. Take 1/2 tablet (100 mg) by mouth once daily    . aspirin EC 81 MG tablet Take 81 mg by mouth daily.    . carvedilol (COREG) 12.5 MG tablet Take 12.5 mg by mouth 2 (two) times daily.    . Cholecalciferol (D 2000) 2000 UNITS TABS Take 2,000 Units by mouth daily.     . clopidogrel (PLAVIX) 75 MG tablet Take 75 mg by mouth daily.     . dapagliflozin propanediol (FARXIGA) 5 MG TABS tablet Take 5 mg by mouth.    . ezetimibe (  ZETIA) 10 MG tablet Take 1 tablet (10 mg total) by mouth daily. 90 tablet 4  . fenofibrate (TRICOR) 145 MG tablet Take 145 mg by mouth daily.     . furosemide (LASIX) 40 MG tablet Take 40 mg by mouth daily.     Marland Kitchen gemfibrozil (LOPID) 600 MG tablet Take 1 tablet (600 mg total) by mouth daily. 90 tablet 3  . HYDROcodone-acetaminophen (NORCO/VICODIN) 5-325 MG tablet Take 1 tablet by mouth every 6 (six) hours as needed for moderate pain. 15 tablet 0  . insulin regular human CONCENTRATED (HUMULIN R) 500 UNIT/ML injection Inject 500 Units as directed once daily. Via pump up to 3 ml daily    . modafinil (PROVIGIL) 200 MG tablet Take  1 tablet (200 mg total) by mouth daily. 30 tablet 5  . nitroGLYCERIN (NITROSTAT) 0.4 MG SL tablet Place 0.4 mg under the tongue every 5 (five) minutes as needed.     . potassium chloride (KLOR-CON M10) 10 MEQ tablet Take 10 mEq by mouth daily.     . rosuvastatin (CRESTOR) 40 MG tablet Take 1 tablet (40 mg total) by mouth daily. 90 tablet 3  . sacubitril-valsartan (ENTRESTO) 49-51 MG Take 1 tablet by mouth 2 (two) times daily. 60 tablet 6  . sertraline (ZOLOFT) 100 MG tablet Take 1 tablet (100 mg total) by mouth daily. 30 tablet 2  . spironolactone (ALDACTONE) 25 MG tablet Take 1 tablet (25 mg total) by mouth daily. 90 tablet 3   No current facility-administered medications for this visit.    Allergies  Allergen Reactions  . Naproxen Other (See Comments)    Other reaction(s): Other (see comments) Causes hyperactivity Hyperactivity       Review of Systems negative except from HPI and PMH  Physical Exam Ht 6' (1.829 m)  Wt 291 lb 8 oz (132.224 kg)  BMI 39.53 kg/m2 Well developed and well nourished in no acute distress HENT normal E scleral and icterus clear Neck Supple JVP flat; carotids brisk and full Clear to ausculation Device pocket well healed; without hematoma or erythema.  There is no tethering Regular rate and rhythm, no murmurs gallops or rub Soft with active bowel sounds No clubbing cyanosis  tr Edema Alert and oriented, grossly normal motor and sensory function Skin Warm and Dry  ECG sinus 74 18/09/42  Assessment and  Plan Ischemic cardiomyopathy  Implantable defibrillator-[employer]    History of inappropriate therapy  Congestive heart failure-chronic-systolic  Amiodarone therapy-  PVCs  <1%  Mostly Euvolemic continue current meds  We will   discontinue his amiodarone  Per Dr. Radene Knee at [facility] we will increase carvedilol to 25 mg twice daily.   I'm concerned about the chest discomfort. We will end up using when necessary nitroglycerin. Increase his  carvedilol may help. I will arrange for earlier than expected follow-up with Dr. Deidre Ala. In the past nuclear testing have been falsely negative. The question would be does any catheterization or augmented empiric therapy

## 2016-03-27 NOTE — Telephone Encounter (Signed)
Patient called to cancel August 18th appointment with Dr. Felecia Shelling, states he can't make it to this appointment, will call back to reschedule.

## 2016-04-01 ENCOUNTER — Encounter: Payer: Self-pay | Admitting: Internal Medicine

## 2016-04-02 ENCOUNTER — Other Ambulatory Visit: Payer: Self-pay | Admitting: *Deleted

## 2016-04-02 LAB — CUP PACEART INCLINIC DEVICE CHECK
Date Time Interrogation Session: 20170717124736
Implantable Lead Implant Date: 20160929
Implantable Lead Implant Date: 20160929
Implantable Lead Location: 753859
Implantable Lead Location: 753860
Implantable Lead Model: 7071
Pulse Gen Serial Number: 7306375

## 2016-04-02 MED ORDER — CARVEDILOL 25 MG PO TABS: 25.0000 mg | ORAL_TABLET | Freq: Two times a day (BID) | ORAL | Status: DC

## 2016-04-02 NOTE — Telephone Encounter (Signed)
Requested Prescriptions   Signed Prescriptions Disp Refills  . carvedilol (COREG) 25 MG tablet 60 tablet 3    Sig: Take 1 tablet (25 mg total) by mouth 2 (two) times daily with a meal.    Authorizing Provider: Deboraha Sprang    Ordering User: Britt Bottom

## 2016-04-05 ENCOUNTER — Encounter: Payer: Self-pay | Admitting: Internal Medicine

## 2016-04-06 DIAGNOSIS — E669 Obesity, unspecified: Secondary | ICD-10-CM | POA: Insufficient documentation

## 2016-04-12 ENCOUNTER — Telehealth: Payer: Self-pay | Admitting: Cardiovascular Disease

## 2016-04-12 NOTE — Telephone Encounter (Signed)
Received records request from [facility] , forwarded/Faxed to [facility] for processing.

## 2016-05-03 ENCOUNTER — Other Ambulatory Visit: Payer: Self-pay | Admitting: Family Medicine

## 2016-05-03 DIAGNOSIS — F329 Major depressive disorder, single episode, unspecified: Secondary | ICD-10-CM

## 2016-05-03 DIAGNOSIS — F419 Anxiety disorder, unspecified: Secondary | ICD-10-CM

## 2016-05-03 DIAGNOSIS — F32A Depression, unspecified: Secondary | ICD-10-CM

## 2016-05-04 ENCOUNTER — Ambulatory Visit (INDEPENDENT_AMBULATORY_CARE_PROVIDER_SITE_OTHER): Payer: BLUE CROSS/BLUE SHIELD | Admitting: Cardiovascular Disease

## 2016-05-04 ENCOUNTER — Ambulatory Visit
Admission: RE | Admit: 2016-05-04 | Discharge: 2016-05-04 | Disposition: A | Discharge status: Home / Self Care | Payer: BLUE CROSS/BLUE SHIELD | Source: Ambulatory Visit | Attending: Cardiovascular Disease | Admitting: Cardiovascular Disease

## 2016-05-04 ENCOUNTER — Ambulatory Visit: Payer: BLUE CROSS/BLUE SHIELD | Admitting: Neurology

## 2016-05-04 ENCOUNTER — Encounter: Payer: Self-pay | Admitting: Cardiovascular Disease

## 2016-05-04 VITALS — BP 110/58 | HR 85 | Ht 72.0 in | Wt 299.0 lb

## 2016-05-04 DIAGNOSIS — R0602 Shortness of breath: Secondary | ICD-10-CM

## 2016-05-04 DIAGNOSIS — E785 Hyperlipidemia, unspecified: Secondary | ICD-10-CM

## 2016-05-04 DIAGNOSIS — E1159 Type 2 diabetes mellitus with other circulatory complications: Secondary | ICD-10-CM | POA: Diagnosis not present

## 2016-05-04 DIAGNOSIS — I200 Unstable angina: Secondary | ICD-10-CM | POA: Diagnosis not present

## 2016-05-04 DIAGNOSIS — Z794 Long term (current) use of insulin: Secondary | ICD-10-CM

## 2016-05-04 DIAGNOSIS — I2510 Atherosclerotic heart disease of native coronary artery without angina pectoris: Secondary | ICD-10-CM | POA: Diagnosis not present

## 2016-05-04 DIAGNOSIS — Z9989 Dependence on other enabling machines and devices: Secondary | ICD-10-CM

## 2016-05-04 DIAGNOSIS — G4733 Obstructive sleep apnea (adult) (pediatric): Secondary | ICD-10-CM

## 2016-05-04 DIAGNOSIS — Z95810 Presence of automatic (implantable) cardiac defibrillator: Secondary | ICD-10-CM

## 2016-05-04 DIAGNOSIS — R0789 Other chest pain: Secondary | ICD-10-CM

## 2016-05-04 DIAGNOSIS — I420 Dilated cardiomyopathy: Secondary | ICD-10-CM | POA: Diagnosis not present

## 2016-05-04 NOTE — Patient Instructions (Addendum)
Medication Instructions:   We will set you up for a cardiac cath at [facility] for angina symptoms  Labwork: Pre-cath labs Please take orders to the [place] entrance of [facility] for chest x-ray  Follow-Up: It was a pleasure seeing you in the office today. Please call us if you have new issues that need to be addressed before your next appt.  [phone]  Your physician wants you to follow-up in: 1 month.   If you need a refill on your cardiac medications before your next appointment, please call your pharmacy.   [facility] Cardiac Cath Instructions   You are scheduled for a Cardiac Cath on:__Thursday, August 24_____  Please arrive at 7:30 am on the day of your procedure  Please expect a call from our [facility] [place] to pre-register you  Do not eat/drink anything after midnight  Someone will need to drive you home  It is recommended someone be with you for the first 24 hrs after your  procedure  Wear clothes that are easy to get on/off and wear slip on shoes if possible  Medications bring a current list of all medications with you  __X_ Do not take these medications before your procedure:  Lasix & spironolactone  Day of your procedure: Arrive at the [facility] entrance.  Free valet service is available.  After entering the [facility] please check-in at the registration desk (1st desk on your right) to receive your armband. After receiving your armband someone will escort you to the cardiac cath/special procedures waiting area.  The usual length of stay after your procedure is about 2 to 3 hours.  This can vary.  If you have any questions, please call our office at [phone], or you may call the cardiac cath lab at [facility] directly at [phone]   Angiogram An angiogram, also called angiography, is a procedure used to look at the blood vessels. In this procedure, dye is injected through a long, thin tube (catheter) into an artery. X-rays are then taken. The  X-rays will show if there is a blockage or problem in a blood vessel.  LET [facility] CARE PROVIDER KNOW ABOUT:  Any allergies you have, including allergies to shellfish or contrast dye.   All medicines you are taking, including vitamins, herbs, eye drops, creams, and over-the-counter medicines.   Previous problems you or members of your family have had with the use of anesthetics.   Any blood disorders you have.   Previous surgeries you have had.  Any previous kidney problems or failure you have had.  Medical conditions you have.   Possibility of pregnancy, if this applies. RISKS AND COMPLICATIONS Generally, an angiogram is a safe procedure. However, as with any procedure, problems can occur. Possible problems include:  Injury to the blood vessels, including rupture or bleeding.  Infection or bruising at the catheter site.  Allergic reaction to the dye or contrast used.  Kidney damage from the dye or contrast used.  Blood clots that can lead to a stroke or heart attack. BEFORE THE PROCEDURE  Do not eat or drink after midnight on the night before the procedure, or as directed by your health care provider.   Ask your health care provider if you may drink enough water to take any needed medicines the morning of the procedure.  PROCEDURE  You may be given a medicine to help you relax (sedative) before and during the procedure. This medicine is given through an IV access tube that is inserted  into one of your veins.   The area where the catheter will be inserted will be washed and shaved. This is usually done in the groin but may be done in the fold of your arm (near your elbow) or in the wrist.  A medicine will be given to numb the area where the catheter will be inserted (local anesthetic).  The catheter will be inserted with a guide wire into an artery. The catheter is guided by using a type of X-ray (fluoroscopy) to the blood vessel being examined.   Dye is  then injected into the catheter, and X-rays are taken. The dye helps to show where any narrowing or blockages are located.  AFTER THE PROCEDURE   If the procedure is done through the leg, you will be kept in bed lying flat for several hours. You will be instructed to not bend or cross your legs.  The insertion site will be checked frequently.  The pulse in your feet or wrist will be checked frequently.  Additional blood tests, X-rays, and electrocardiography may be done.   You may need to stay in the hospital overnight for observation.    This information is not intended to replace advice given to you by your health care provider. Make sure you discuss any questions you have with your health care provider.   Document Released: 06/13/2005 Document Revised: 09/24/2014 Document Reviewed: 02/04/2013 Elsevier Interactive Patient Education 2016 [place] After Refer to this sheet in the next few weeks. These instructions provide you with information about caring for yourself after your procedure. Your health care provider may also give you more specific instructions. Your treatment has been planned according to current medical practices, but problems sometimes occur. Call your health care provider if you have any problems or questions after your procedure. WHAT TO EXPECT AFTER THE PROCEDURE After your procedure, it is typical to have the following:  Bruising at the catheter insertion site that usually fades within 1-2 weeks.  Blood collecting in the tissue (hematoma) that may be painful to the touch. It should usually decrease in size and tenderness within 1-2 weeks. HOME CARE INSTRUCTIONS  Take medicines only as directed by your health care provider.  You may shower 24-48 hours after the procedure or as directed by your health care provider. Remove the bandage (dressing) and gently wash the site with plain soap and water. Pat the area dry with a clean towel. Do not rub  the site, because this may cause bleeding.  Do not take baths, swim, or use a hot tub until your health care provider approves.  Check your insertion site every day for redness, swelling, or drainage.  Do not apply powder or lotion to the site.  Do not lift over 10 lb (4.5 kg) for 5 days after your procedure or as directed by your health care provider.  Ask your health care provider when it is okay to:  Return to work or school.  Resume usual physical activities or sports.  Resume sexual activity.  Do not drive home if you are discharged the same day as the procedure. Have someone else drive you.  You may drive 24 hours after the procedure unless otherwise instructed by your health care provider.  Do not operate machinery or power tools for 24 hours after the procedure or as directed by your health care provider.  If your procedure was done as an outpatient procedure, which means that you went home the same day as your  procedure, a responsible adult should be with you for the first 24 hours after you arrive home.  Keep all follow-up visits as directed by your health care provider. This is important. SEEK MEDICAL CARE IF:  You have a fever.  You have chills.  You have increased bleeding from the catheter insertion site. Hold pressure on the site. SEEK IMMEDIATE MEDICAL CARE IF:  You have unusual pain at the catheter insertion site.  You have redness, warmth, or swelling at the catheter insertion site.  You have drainage (other than a small amount of blood on the dressing) from the catheter insertion site.  The catheter insertion site is bleeding, and the bleeding does not stop after 30 minutes of holding steady pressure on the site.  The area near or just beyond the catheter insertion site becomes pale, cool, tingly, or numb.   This information is not intended to replace advice given to you by your health care provider. Make sure you discuss any questions you have with  your health care provider.   Document Released: 03/22/2005 Document Revised: 09/24/2014 Document Reviewed: 02/04/2013 Elsevier Interactive Patient Education [employer].

## 2016-05-04 NOTE — Progress Notes (Signed)
Cardiology Office Note  Date:  05/04/2016   ID:  Brian Williams, DOB May 01, 1961, MRN [id number]  PCP:  [name], DO   Chief Complaint  Patient presents with  . Other    12 month follow up. Meds reviewed by the patient Pt. c/o chest burning and shortness of breath.     HPI:  [name] is a pleasant 55 year old gentleman with history of coronary artery disease, long history of smoking, diabetes type 2 poorly controlled with  hemoglobin A1c of 9, followed by endocrine/Dr. Gabriel Williams, prior history of cardiac catheterization 12 dating back to 2002, ischemic cardiomyopathy, last echocardiogram March 2016 showing ejection fraction 20-25%, who presents for routine follow-up of his coronary artery disease History of sustained VT, VT ablation in [place] at [facility] of sleep apnea, on CPAP recent generator change in 2016 ICD in place,   previously seen by seen by [facility] cardiology heart failure/transplant team, Brian Williams.   In follow-up today, he reports having new symptoms of burning in his chest when he walks Symptoms seem to radiate up into his throat Worsening over the past year Typically resolve after several minutes when he sits down  Does not have symptoms at rest  Feels different from previous blockage symptoms, but knows something is wrong  reports having symptoms when he walked into the office [place] the [place] today Previous angina was  chest pressure Current sx less pressure, Now hot burning, SOB, neck on fire Denies any PND or orthopnea  Lab work reviewed  LDL 65 HBA1C 9.3  EKG shows normal sinus rhythm with rate 85 bpm, nonspecific ST abnormality   other past medical history  Previously reported having memory issues  Feels he is forgetting things, losing attention, poor focus Wife has to continually her mind him about things, his own children have noticed he is forgetful For example he reports when he is driving, often goes wrong direction, forgets to take the turn  that he is supposed to [facility] who to talk to about these new symptoms Reports his sleep is poor, sometimes sleeping well, most often sleeping very poorly, only several hours at a time  Denies having any significant change in his symptoms with decreased amiodarone down to 100 mg daily, denies worsening palpitations or tachycardia, no ICD shock/therapy  Rare episodes of orthostasis He continues to smoke off and on Hemoglobin A1c of 9, continues to eat poorly He is trying various diets, low carbohydrate Total cholesterol still above goal, reviewed with the patient on today's visit  EKG on today's visit shows normal sinus rhythm with rate 78 bpm, consider old inferior MI  Other past medical history Previously seen in [place] for his cardiac issues. History dates back to 2002 when he had distal RCA disease. Several catheterizations over the next several years for in-stent restenosis of the distal RCA. Later cath, stent placed to the LAD. Most recent catheterization appears to be April 2011 showing patent LAD and RCA stent.  Despite this, he has had a decline in his ejection fraction over the past several years requiring defibrillator for ventricular tachycardia. Arrhythmia seems to be controlled on nadolol (was previously on 80 mg twice a day, decreased down to daily secondary to low blood pressure and fatigue)   only had one shock since the device was placed.   seen by [facility] cardiology heart failure/transplant team, Brian Williams. No medication changes at that time. Creatinine was elevated with BUN, improved on subsequent lab work.   Previous cholesterol per his notes  July 2015 showing total cholesterol 185, HDL 19, triglycerides 800, this was on Crestor 40, fenofibrate  PMH:   has a past medical history of AICD (automatic cardioverter/defibrillator) present; CHF (congestive heart failure) (Brian Williams); Chicken pox; Coronary artery disease; Depression; Diabetes mellitus without  complication (Brian Williams); ED (erectile dysfunction); GERD (gastroesophageal reflux disease); Heart disease; Hyperlipidemia; Hypertension; MI (myocardial infarction) (Brian Williams); Sleep apnea; and Ventricular tachycardia (Brian Williams).  PSH:    Past Surgical History:  Procedure Laterality Date  . BACK SURGERY    . CARDIAC CATHETERIZATION    . CARDIAC DEFIBRILLATOR PLACEMENT  10/16/2007   ICD Model number 2207-36 serial number [id number]  . CARDIAC ELECTROPHYSIOLOGY STUDY AND ABLATION    . CHOLECYSTECTOMY  2001  . CORONARY ANGIOPLASTY WITH STENT PLACEMENT    . EP IMPLANTABLE DEVICE N/A 06/16/2015   Procedure: ICD Generator Changeout;  Surgeon: Deboraha Sprang, MD;  Location: [place] [facility];  Service: Cardiovascular;  Laterality: N/A;  . INSERT / REPLACE / REMOVE PACEMAKER    . KNEE SURGERY     bilateral knee     Current Outpatient Prescriptions  Medication Sig Dispense Refill  . ALPRAZolam (XANAX) 0.5 MG tablet TAKE 1 TABLET BY MOUTH AT BEDTIME AS NEEDED 60 tablet 1  . aspirin EC 81 MG tablet Take 81 mg by mouth daily.    . carvedilol (COREG) 25 MG tablet Take 1 tablet (25 mg total) by mouth 2 (two) times daily with a meal. 60 tablet 3  . Cholecalciferol (D 2000) 2000 UNITS TABS Take 2,000 Units by mouth daily.     . clopidogrel (PLAVIX) 75 MG tablet Take 75 mg by mouth daily.     . dapagliflozin propanediol (FARXIGA) 5 MG TABS tablet Take 5 mg by mouth.    . ezetimibe (ZETIA) 10 MG tablet Take 1 tablet (10 mg total) by mouth daily. 90 tablet 4  . fenofibrate (TRICOR) 145 MG tablet Take 145 mg by mouth daily.     . furosemide (LASIX) 40 MG tablet Take 40 mg by mouth daily.     Marland Kitchen gemfibrozil (LOPID) 600 MG tablet Take 1 tablet (600 mg total) by mouth daily. 90 tablet 3  . HYDROcodone-acetaminophen (NORCO/VICODIN) 5-325 MG tablet Take 1 tablet by mouth every 6 (six) hours as needed for moderate pain. 15 tablet 0  . insulin regular human CONCENTRATED (HUMULIN R) 500 UNIT/ML injection Inject 500 Units as directed  once daily. Via pump up to 3 ml daily    . nitroGLYCERIN (NITROSTAT) 0.4 MG SL tablet Place 0.4 mg under the tongue every 5 (five) minutes as needed.     . potassium chloride (KLOR-CON M10) 10 MEQ tablet Take 10 mEq by mouth daily.     . rosuvastatin (CRESTOR) 40 MG tablet Take 1 tablet (40 mg total) by mouth daily. 90 tablet 3  . sacubitril-valsartan (ENTRESTO) 49-51 MG Take 1 tablet by mouth 2 (two) times daily. 60 tablet 6  . sertraline (ZOLOFT) 100 MG tablet TAKE 1 TABLET (100 MG TOTAL) BY MOUTH DAILY. 30 tablet 2  . spironolactone (ALDACTONE) 25 MG tablet Take 1 tablet (25 mg total) by mouth daily. 90 tablet 3   No current facility-administered medications for this visit.      Allergies:   Naproxen   Social History:  The patient  reports that he has quit smoking. His smoking use included Cigarettes. He smoked 0.50 packs per day. He has never used smokeless tobacco. He reports that he drinks alcohol. He reports that he does  not use drugs.   Family History:   family history includes Heart attack (age of onset: 42) in his mother; Heart attack (age of onset: 77) in his father; Heart disease in his maternal grandmother and maternal uncle; Hypertension in his father, maternal uncle, and mother; Stroke in his maternal grandmother.    Review of Systems: Review of Systems  Constitutional: Negative.   Respiratory: Positive for shortness of breath.   Cardiovascular: Positive for chest pain and leg swelling.       "Chest is on fire", radiating up to neck  Gastrointestinal: Negative.   Musculoskeletal: Negative.   Neurological: Negative.   Psychiatric/Behavioral: Negative.   All other systems reviewed and are negative.    PHYSICAL EXAM: VS:  BP (!) 110/58 (BP Location: Left Arm, Patient Position: Sitting, Cuff Size: Normal)   Pulse 85   Ht 6' (1.829 m)   Wt 299 lb (135.6 kg)   BMI 40.55 kg/m  , BMI Body mass index is 40.55 kg/m. GEN: Well nourished, well developed, in no acute  distress , obese HEENT: normal  Neck: no JVD, carotid bruits, or masses Cardiac: RRR; no murmurs, rubs, or gallops, trace  pitting edema  bilateral lower extremities  Respiratory:  clear to auscultation bilaterally, normal work of breathing GI: soft, nontender, nondistended, + BS MS: no deformity or atrophy  Skin: warm and dry, no rash Neuro:  Strength and sensation are intact Psych: euthymic mood, full affect    Recent Labs: 06/03/2015: ALT 24; BUN 15; Creatinine, Ser 0.90; Platelets 202; Potassium 4.3; Sodium 139; TSH 3.210    Lipid Panel Lab Results  Component Value Date   CHOL 171 10/06/2015   HDL 20.50 (L) 10/06/2015   TRIG (H) 10/06/2015    574.0 Triglyceride is over 400; calculations on Lipids are invalid.      Wt Readings from Last 3 Encounters:  05/04/16 299 lb (135.6 kg)  03/27/16 291 lb 8 oz (132.2 kg)  01/06/16 296 lb (134.3 kg)       ASSESSMENT AND PLAN:  Unstable angina  Symptoms very concerning for underlying ischemia He does not want a stress test, reports that they did not work in the past Had stress tests and then 3 weeks later had major blockage He prefers cardiac catheterization Symptoms clearly exertional related, currently do not occur at rest, predominately with exertion though getting worse over the past year Discussed various treatment options with him and he prefers to have cardiac catheterization. I have reviewed the risks, indications, and alternatives to cardiac catheterization, possible angioplasty, and stenting with the patient. Risks include but are not limited to bleeding, infection, vascular injury, stroke, myocardial infection, arrhythmia, kidney injury, radiation-related injury in the case of prolonged fluoroscopy use, emergency cardiac surgery, and death. The patient understands the risks of serious complication is 1-2 in 123XX123 with diagnostic cardiac cath and 1-2% or less with angioplasty/stenting.   SOB (shortness of breath) - Plan:  EKG XX123456, Basic Metabolic Panel (BMET), CBC with Differential/Platelet, INR/PT, DG Chest 2 View Chronic baseline shortness of breath with exertion, perhaps mildly worse over the past year Slight increase in lower extremity edema No recent blood work available for we advance his diuretic regiment Labs drawn today  Coronary artery disease involving native coronary artery of native heart without angina pectoris Anginal symptoms as above Plan for catheterization Last catheterization 2011 at outside facility History of LAD and RCA stent  Congestive dilated cardiomyopathy ([place]) Clinical exam with mild fluid overload, trace pitting edema of  the legs, he feels as above his baseline. We'll get blood work before advancing his diuretic. We'll do right heart catheterization as well as left to check pressures   Hyperlipidemia Cholesterol slightly above goal,  Numbers would improve with better diabetes control  Discussed his diet with him ,   Need for weight loss   ICD (implantable cardioverter-defibrillator) in place  recent generator change in 2016   Type 2 diabetes mellitus with other circulatory complication, with long-term current use of insulin (HCC)  hemoglobin A1c greater than 9 Dietary indiscretion  OSA on CPAP  stressed importance of compliance on his CPAP   Total encounter time more than 40 minutes  Greater than 50% was spent in counseling and coordination of care with the patient   Disposition:   F/U  1 month   Orders Placed This Encounter  Procedures  . DG Chest 2 View  . Basic Metabolic Panel (BMET)  . CBC with Differential/Platelet  . INR/PT  . EKG 12-Lead     Signed, Esmond Plants, M.D., Ph.D. 05/04/2016  [place], [place]

## 2016-05-05 LAB — CBC WITH DIFFERENTIAL/PLATELET
Basophils Absolute: 0 10*3/uL (ref 0.0–0.2)
Basos: 0 %
EOS (ABSOLUTE): 0.3 10*3/uL (ref 0.0–0.4)
Eos: 6 %
Hematocrit: 36.5 % — ABNORMAL LOW (ref 37.5–51.0)
Hemoglobin: 12.1 g/dL — ABNORMAL LOW (ref 12.6–17.7)
Immature Grans (Abs): 0 10*3/uL (ref 0.0–0.1)
Immature Granulocytes: 0 %
Lymphocytes Absolute: 2.2 10*3/uL (ref 0.7–3.1)
Lymphs: 38 %
MCH: 31.1 pg (ref 26.6–33.0)
MCHC: 33.2 g/dL (ref 31.5–35.7)
MCV: 94 fL (ref 79–97)
Monocytes Absolute: 0.4 10*3/uL (ref 0.1–0.9)
Monocytes: 6 %
Neutrophils Absolute: 3 10*3/uL (ref 1.4–7.0)
Neutrophils: 50 %
Platelets: 202 10*3/uL (ref 150–379)
RBC: 3.89 x10E6/uL — ABNORMAL LOW (ref 4.14–5.80)
RDW: 14.9 % (ref 12.3–15.4)
WBC: 5.9 10*3/uL (ref 3.4–10.8)

## 2016-05-05 LAB — BASIC METABOLIC PANEL
BUN/Creatinine Ratio: 17 (ref 9–20)
BUN: 13 mg/dL (ref 6–24)
CO2: 17 mmol/L — ABNORMAL LOW (ref 18–29)
Calcium: 9.7 mg/dL (ref 8.7–10.2)
Chloride: 102 mmol/L (ref 96–106)
Creatinine, Ser: 0.77 mg/dL (ref 0.76–1.27)
GFR calc Af Amer: 118 mL/min/{1.73_m2} (ref >59)
GFR calc non Af Amer: 102 mL/min/{1.73_m2} (ref >59)
Glucose: 198 mg/dL — ABNORMAL HIGH (ref 65–99)
Potassium: 4.7 mmol/L (ref 3.5–5.2)
Sodium: 139 mmol/L (ref 134–144)

## 2016-05-05 LAB — PROTIME-INR
INR: 1 (ref 0.8–1.2)
Prothrombin Time: 10.4 s (ref 9.1–12.0)

## 2016-05-09 ENCOUNTER — Other Ambulatory Visit: Payer: Self-pay | Admitting: Cardiovascular Disease

## 2016-05-09 DIAGNOSIS — I200 Unstable angina: Secondary | ICD-10-CM

## 2016-05-10 ENCOUNTER — Encounter: Payer: Self-pay | Admitting: *Deleted

## 2016-05-10 ENCOUNTER — Other Ambulatory Visit: Payer: Self-pay

## 2016-05-10 ENCOUNTER — Encounter
Admission: RE | Disposition: A | Discharge status: Home / Self Care | Payer: Self-pay | Source: Ambulatory Visit | Attending: Cardiovascular Disease

## 2016-05-10 ENCOUNTER — Telehealth: Payer: Self-pay | Admitting: Family Medicine

## 2016-05-10 ENCOUNTER — Ambulatory Visit
Admission: RE | Admit: 2016-05-10 | Discharge: 2016-05-10 | Disposition: A | Discharge status: Home / Self Care | Payer: BLUE CROSS/BLUE SHIELD | Source: Ambulatory Visit | Attending: Cardiovascular Disease | Admitting: Cardiovascular Disease

## 2016-05-10 DIAGNOSIS — Z79899 Other long term (current) drug therapy: Secondary | ICD-10-CM | POA: Insufficient documentation

## 2016-05-10 DIAGNOSIS — Z7982 Long term (current) use of aspirin: Secondary | ICD-10-CM | POA: Insufficient documentation

## 2016-05-10 DIAGNOSIS — I25110 Atherosclerotic heart disease of native coronary artery with unstable angina pectoris: Secondary | ICD-10-CM

## 2016-05-10 DIAGNOSIS — Z87891 Personal history of nicotine dependence: Secondary | ICD-10-CM | POA: Insufficient documentation

## 2016-05-10 DIAGNOSIS — F17200 Nicotine dependence, unspecified, uncomplicated: Secondary | ICD-10-CM | POA: Diagnosis not present

## 2016-05-10 DIAGNOSIS — Z794 Long term (current) use of insulin: Secondary | ICD-10-CM | POA: Insufficient documentation

## 2016-05-10 DIAGNOSIS — F329 Major depressive disorder, single episode, unspecified: Secondary | ICD-10-CM | POA: Insufficient documentation

## 2016-05-10 DIAGNOSIS — I200 Unstable angina: Secondary | ICD-10-CM | POA: Diagnosis present

## 2016-05-10 DIAGNOSIS — Z95810 Presence of automatic (implantable) cardiac defibrillator: Secondary | ICD-10-CM | POA: Diagnosis not present

## 2016-05-10 DIAGNOSIS — I472 Ventricular tachycardia: Secondary | ICD-10-CM | POA: Diagnosis not present

## 2016-05-10 DIAGNOSIS — E785 Hyperlipidemia, unspecified: Secondary | ICD-10-CM | POA: Insufficient documentation

## 2016-05-10 DIAGNOSIS — I252 Old myocardial infarction: Secondary | ICD-10-CM | POA: Insufficient documentation

## 2016-05-10 DIAGNOSIS — I420 Dilated cardiomyopathy: Secondary | ICD-10-CM | POA: Insufficient documentation

## 2016-05-10 DIAGNOSIS — Z9049 Acquired absence of other specified parts of digestive tract: Secondary | ICD-10-CM | POA: Diagnosis not present

## 2016-05-10 DIAGNOSIS — E1165 Type 2 diabetes mellitus with hyperglycemia: Secondary | ICD-10-CM | POA: Insufficient documentation

## 2016-05-10 DIAGNOSIS — Z955 Presence of coronary angioplasty implant and graft: Secondary | ICD-10-CM

## 2016-05-10 DIAGNOSIS — G4733 Obstructive sleep apnea (adult) (pediatric): Secondary | ICD-10-CM | POA: Diagnosis not present

## 2016-05-10 DIAGNOSIS — I255 Ischemic cardiomyopathy: Secondary | ICD-10-CM | POA: Insufficient documentation

## 2016-05-10 DIAGNOSIS — R931 Abnormal findings on diagnostic imaging of heart and coronary circulation: Secondary | ICD-10-CM

## 2016-05-10 DIAGNOSIS — I25118 Atherosclerotic heart disease of native coronary artery with other forms of angina pectoris: Secondary | ICD-10-CM

## 2016-05-10 HISTORY — PX: CARDIAC CATHETERIZATION: SHX172

## 2016-05-10 LAB — GLUCOSE, CAPILLARY: Glucose-Capillary: 226 mg/dL — ABNORMAL HIGH (ref 65–99)

## 2016-05-10 SURGERY — LEFT HEART CATH AND CORONARY ANGIOGRAPHY
Anesthesia: Moderate Sedation | Laterality: Left

## 2016-05-10 MED ORDER — MIDAZOLAM HCL 2 MG/2ML IJ SOLN
INTRAMUSCULAR | Status: DC | PRN
  Administered 2016-05-10 (×2): 1 mg via INTRAVENOUS

## 2016-05-10 MED ORDER — HYDROMORPHONE HCL 1 MG/ML IJ SOLN
0.5000 mg | Freq: Once | INTRAMUSCULAR | Status: AC
  Administered 2016-05-10: 0.5 mg via INTRAVENOUS

## 2016-05-10 MED ORDER — SODIUM CHLORIDE 0.9 % IV SOLN
INTRAVENOUS | Status: DC
Start: 2016-05-10 — End: 2016-05-10
  Administered 2016-05-10: 08:00:00 via INTRAVENOUS

## 2016-05-10 MED ORDER — FENTANYL CITRATE (PF) 100 MCG/2ML IJ SOLN
INTRAMUSCULAR | Status: AC
  Filled 2016-05-10: qty 2

## 2016-05-10 MED ORDER — MIDAZOLAM HCL 2 MG/2ML IJ SOLN
INTRAMUSCULAR | Status: AC
  Filled 2016-05-10: qty 2

## 2016-05-10 MED ORDER — HYDROMORPHONE HCL 1 MG/ML IJ SOLN
INTRAMUSCULAR | Status: AC
  Administered 2016-05-10: 0.5 mg via INTRAVENOUS
  Filled 2016-05-10: qty 1

## 2016-05-10 MED ORDER — SODIUM CHLORIDE 0.9 % WEIGHT BASED INFUSION: 3.0000 mL/kg/h | INTRAVENOUS | Status: DC

## 2016-05-10 MED ORDER — ASPIRIN 81 MG PO CHEW: 81.0000 mg | CHEWABLE_TABLET | ORAL | Status: DC

## 2016-05-10 MED ORDER — ASPIRIN 81 MG PO CHEW
CHEWABLE_TABLET | ORAL | Status: AC
  Administered 2016-05-10: 09:00:00
  Filled 2016-05-10: qty 1

## 2016-05-10 MED ORDER — HEPARIN (PORCINE) IN NACL 2-0.9 UNIT/ML-% IJ SOLN
INTRAMUSCULAR | Status: AC
Start: 2016-05-10 — End: 2016-05-10
  Filled 2016-05-10: qty 1000

## 2016-05-10 MED ORDER — SODIUM CHLORIDE 0.9 % IV SOLN
INTRAVENOUS | Status: DC | PRN
  Administered 2016-05-10: 250 mL via INTRAVENOUS

## 2016-05-10 MED ORDER — OXYCODONE-ACETAMINOPHEN 5-325 MG PO TABS
2.0000 | ORAL_TABLET | Freq: Once | ORAL | Status: AC
  Administered 2016-05-10: 2 via ORAL

## 2016-05-10 MED ORDER — FENTANYL CITRATE (PF) 100 MCG/2ML IJ SOLN
INTRAMUSCULAR | Status: DC | PRN
  Administered 2016-05-10 (×2): 50 ug via INTRAVENOUS

## 2016-05-10 MED ORDER — OXYCODONE-ACETAMINOPHEN 5-325 MG PO TABS
ORAL_TABLET | ORAL | Status: AC
  Filled 2016-05-10: qty 2

## 2016-05-10 MED ORDER — IOPAMIDOL (ISOVUE-300) INJECTION 61%
INTRAVENOUS | Status: DC | PRN
  Administered 2016-05-10: 130 mL via INTRA_ARTERIAL

## 2016-05-10 MED ORDER — SODIUM CHLORIDE 0.9 % WEIGHT BASED INFUSION: 1.0000 mL/kg/h | INTRAVENOUS | Status: DC

## 2016-05-10 SURGICAL SUPPLY — 11 items
CATH INFINITI 5FR ANG PIGTAIL (CATHETERS) ×2 IMPLANT
CATH INFINITI 5FR JL4 (CATHETERS) ×2 IMPLANT
CATH INFINITI 5FR JL5 (CATHETERS) ×2 IMPLANT
CATH INFINITI JR4 5F (CATHETERS) ×2 IMPLANT
DEVICE CLOSURE MYNXGRIP 5F (Vascular Products) ×2 IMPLANT
KIT MANI 3VAL PERCEP (MISCELLANEOUS) ×2 IMPLANT
NEEDLE PERC 18GX7CM (NEEDLE) ×2 IMPLANT
NEEDLE SMART 18G ACCESS (NEEDLE) ×2 IMPLANT
PACK CARDIAC CATH (CUSTOM PROCEDURE TRAY) ×2 IMPLANT
SHEATH AVANTI 5FR X 11CM (SHEATH) ×2 IMPLANT
WIRE EMERALD 3MM-J .035X150CM (WIRE) ×2 IMPLANT

## 2016-05-10 NOTE — Telephone Encounter (Signed)
Please advise 

## 2016-05-10 NOTE — Progress Notes (Signed)
Cardiac catheterization showing multivessel disease Moderate to severe distal left main Severe in-stent restenosis of proximal LAD stent 2 regions of severe disease in the proximal and mid RCA OM 2 disease Inferior wall akinesis Ejection fraction 35%  Long discussion with patient concerning the above, discussed results with patient's wife  All questions answered  He was under the assumption everything could be fixed with a stent  Significant anxiety post catheterization  Lots of time spent with him talking about bypass surgery, risk and benefit  We did mention previous study showing that diabetics seem to do well with bypass surgery versus multiple stenting  Discussed the implications of left main disease   He is willing to meet surgeons at [facility] hospital to discuss  We'll provide prescription for Xanax as he is requesting something for anxiety prior to the bypass surgery    Total encounter time more than 60 minutes  Greater than 50% was spent in counseling and coordination of care with the patient  Signed, Esmond Plants, MD, Ph.D [facility]

## 2016-05-10 NOTE — Telephone Encounter (Signed)
Pt called about just being told he needs a triple heart bipass. Pt wants to know if he can have something to calm him down? Pt is very nervous. Please advise?  Call pt@ [phone]. Thank you!

## 2016-05-11 ENCOUNTER — Encounter: Payer: Self-pay | Admitting: Cardiovascular Disease

## 2016-05-11 NOTE — Telephone Encounter (Signed)
Patient stated his Xanax is not helping. Please adivise.

## 2016-05-11 NOTE — Telephone Encounter (Signed)
Patient is on Xanax.

## 2016-05-12 NOTE — Telephone Encounter (Signed)
Will need to be seen. I cannot do this over the phone.

## 2016-05-13 NOTE — H&P (Signed)
H&P Cardiology Note  Date:  04/30/2016   ID:  Brian Williams, DOB 02/14/1961, MRN [id number]  PCP:  [name], DO                    Details below taken from recent office note 05/04/16   HPI:  Brian Williams is a pleasant 55 year old gentleman with history of coronary artery disease, long history of smoking, diabetes type 2 poorly controlled with hemoglobin A1c of 9, followed by endocrine/Dr. Gabriel Carina, prior history of cardiac catheterization 12 dating back to 2002, ischemic cardiomyopathy, last echocardiogram March 2016 showing ejection fraction 20-25%, who presents for cardiac cath for sx of unstable angina.  History of sustained VT, VT ablation in [place] at [facility] History of sleep apnea, on CPAP recent generator change in 2016 ICD in place,   previously seen by seen by [facility] cardiology heart failure/transplant team, [name].   he reports having new symptoms of burning in his chest when he walks Symptoms seem to radiate up into his throat Worsening over the past year Typically resolve after several minutes when he sits down  Does not have symptoms at rest  Feels different from previous blockage symptoms, but knows something is wrong  reports having symptoms when he walked into the office [place] the [place] today Previous angina was  chest pressure Current sx less pressure, Now hot burning, SOB, neck on fire Denies any PND or orthopnea  Lab work reviewed  LDL 65 HBA1C 9.3  EKG 8/18 shows normal sinus rhythm with rate 85 bpm, nonspecific ST abnormality   other past medical history  Previously reported having memory issues  Feels he is forgetting things, losing attention, poor focus Wife has to continually her mind him about things, his own children have noticed he is forgetful For example he reports when he is driving, often goes wrong direction, forgets to take the turn that he is supposed to [facility] who to talk to about these new symptoms Reports his sleep is  poor, sometimes sleeping well, most often sleeping very poorly, only several hours at a time  Denies having any significant change in his symptoms with decreased amiodarone down to 100 mg daily, denies worsening palpitations or tachycardia, no ICD shock/therapy  Rare episodes of orthostasis He continues to smoke off and on Hemoglobin A1c of 9, continues to eat poorly He is trying various diets, low carbohydrate Total cholesterol still above goal, reviewed with the patient on today's visit  EKG on today's visit shows normal sinus rhythm with rate 78 bpm, consider old inferior MI  Other past medical history Previously seen in [place] for his cardiac issues. History dates back to 2002 when he had distal RCA disease. Several catheterizations over the next several years for in-stent restenosis of the distal RCA. Later cath, stent placed to the LAD. Most recent catheterization appears to be April 2011 showing patent LAD and RCA stent.  Despite this, he has had a decline in his ejection fraction over the past several years requiring defibrillator for ventricular tachycardia. Arrhythmia seems to be controlled on nadolol (was previously on 80 mg twice a day, decreased down to daily secondary to low blood pressure and fatigue)  only had one shock since the device was placed.   seen by [facility] cardiology heart failure/transplant team, [name]. No medication changes at that time. Creatinine was elevated with BUN, improved on subsequent lab work.   Previous cholesterol per his notes July 2015 showing total cholesterol 185, HDL 19,  triglycerides 800, this was on Crestor 40, fenofibrate  PMH:   has a past medical history of AICD (automatic cardioverter/defibrillator) present; CHF (congestive heart failure) ([place]); Chicken pox; Coronary artery disease; Depression; Diabetes mellitus without complication ([place]); ED (erectile dysfunction); GERD (gastroesophageal reflux disease); Heart  disease; Hyperlipidemia; Hypertension; MI (myocardial infarction) ([place]); Sleep apnea; and Ventricular tachycardia ([place]).  PSH:         Past Surgical History:  Procedure Laterality Date  . BACK SURGERY    . CARDIAC CATHETERIZATION    . CARDIAC DEFIBRILLATOR PLACEMENT  10/16/2007   ICD Model number 2207-36 serial number [id number]  . CARDIAC ELECTROPHYSIOLOGY STUDY AND ABLATION    . CHOLECYSTECTOMY  2001  . CORONARY ANGIOPLASTY WITH STENT PLACEMENT    . EP IMPLANTABLE DEVICE N/A 06/16/2015   Procedure: ICD Generator Changeout;  Surgeon: Deboraha Sprang, MD;  Location: [place] [facility];  Service: Cardiovascular;  Laterality: N/A;  . INSERT / REPLACE / REMOVE PACEMAKER    . KNEE SURGERY     bilateral knee           Current Outpatient Prescriptions  Medication Sig Dispense Refill  . ALPRAZolam (XANAX) 0.5 MG tablet TAKE 1 TABLET BY MOUTH AT BEDTIME AS NEEDED 60 tablet 1  . aspirin EC 81 MG tablet Take 81 mg by mouth daily.    . carvedilol (COREG) 25 MG tablet Take 1 tablet (25 mg total) by mouth 2 (two) times daily with a meal. 60 tablet 3  . Cholecalciferol (D 2000) 2000 UNITS TABS Take 2,000 Units by mouth daily.     . clopidogrel (PLAVIX) 75 MG tablet Take 75 mg by mouth daily.     . dapagliflozin propanediol (FARXIGA) 5 MG TABS tablet Take 5 mg by mouth.    . ezetimibe (ZETIA) 10 MG tablet Take 1 tablet (10 mg total) by mouth daily. 90 tablet 4  . fenofibrate (TRICOR) 145 MG tablet Take 145 mg by mouth daily.     . furosemide (LASIX) 40 MG tablet Take 40 mg by mouth daily.     Marland Kitchen gemfibrozil (LOPID) 600 MG tablet Take 1 tablet (600 mg total) by mouth daily. 90 tablet 3  . HYDROcodone-acetaminophen (NORCO/VICODIN) 5-325 MG tablet Take 1 tablet by mouth every 6 (six) hours as needed for moderate pain. 15 tablet 0  . insulin regular human CONCENTRATED (HUMULIN R) 500 UNIT/ML injection Inject 500 Units as directed once daily. Via pump up to 3 ml daily    .  nitroGLYCERIN (NITROSTAT) 0.4 MG SL tablet Place 0.4 mg under the tongue every 5 (five) minutes as needed.     . potassium chloride (KLOR-CON M10) 10 MEQ tablet Take 10 mEq by mouth daily.     . rosuvastatin (CRESTOR) 40 MG tablet Take 1 tablet (40 mg total) by mouth daily. 90 tablet 3  . sacubitril-valsartan (ENTRESTO) 49-51 MG Take 1 tablet by mouth 2 (two) times daily. 60 tablet 6  . sertraline (ZOLOFT) 100 MG tablet TAKE 1 TABLET (100 MG TOTAL) BY MOUTH DAILY. 30 tablet 2  . spironolactone (ALDACTONE) 25 MG tablet Take 1 tablet (25 mg total) by mouth daily. 90 tablet 3   No current facility-administered medications for this visit.      Allergies:   Naproxen   Social History:  The patient  reports that he has quit smoking. His smoking use included Cigarettes. He smoked 0.50 packs per day. He has never used smokeless tobacco. He reports that he drinks alcohol. He reports  that he does not use drugs.   Family History:   family history includes Heart attack (age of onset: 64) in his mother; Heart attack (age of onset: 44) in his father; Heart disease in his maternal grandmother and maternal uncle; Hypertension in his father, maternal uncle, and mother; Stroke in his maternal grandmother.    Review of Systems: Review of Systems  Constitutional: Negative.   Respiratory: Positive for shortness of breath.   Cardiovascular: Positive for chest pain and leg swelling.       "Chest is on fire", radiating up to neck  Gastrointestinal: Negative.   Musculoskeletal: Negative.   Neurological: Negative.   Psychiatric/Behavioral: Negative.   All other systems reviewed and are negative.    PHYSICAL EXAM: VS:  from 8/18  BP (!) 110/58 (BP Location: Left Arm, Patient Position: Sitting, Cuff Size: Normal)   Pulse 85   Ht 6' (1.829 m)   Wt 299 lb (135.6 kg)   BMI 40.55 kg/m  , BMI Body mass index is 40.55 kg/m. GEN: Well nourished, well developed, in no acute distress ,  obese HEENT: normal  Neck: no JVD, carotid bruits, or masses Cardiac: RRR; no murmurs, rubs, or gallops, trace  pitting edema  bilateral lower extremities  Respiratory:  clear to auscultation bilaterally, normal work of breathing GI: soft, nontender, nondistended, + BS MS: no deformity or atrophy  Skin: warm and dry, no rash Neuro:  Strength and sensation are intact Psych: euthymic mood, full affect    Recent Labs: 06/03/2015: ALT 24; BUN 15; Creatinine, Ser 0.90; Platelets 202; Potassium 4.3; Sodium 139; TSH 3.210    Lipid Panel Recent Labs        Lab Results  Component Value Date   CHOL 171 10/06/2015   HDL 20.50 (L) 10/06/2015   TRIG (H) 10/06/2015    574.0 Triglyceride is over 400; calculations on Lipids are invalid.           Wt Readings from Last 3 Encounters:  05/04/16 299 lb (135.6 kg)  03/27/16 291 lb 8 oz (132.2 kg)  01/06/16 296 lb (134.3 kg)       ASSESSMENT AND PLAN:  Unstable angina  Symptoms very concerning for underlying ischemia, unstable angina  cardiac catheterization scheduled Symptoms clearly exertional related, currently do not occur at rest, predominately with exertion though getting worse over the past year Discussed various treatment options with him and he prefers to have cardiac catheterization. I have reviewed the risks, indications, and alternatives to cardiac catheterization, possible angioplasty, and stenting with the patient. Risks include but are not limited to bleeding, infection, vascular injury, stroke, myocardial infection, arrhythmia, kidney injury, radiation-related injury in the case of prolonged fluoroscopy use, emergency cardiac surgery, and death. The patient understands the risks of serious complication is 1-2 in 123XX123 with diagnostic cardiac cath and 1-2% or less with angioplasty/stenting.   SOB (shortness of breath) - Plan: EKG XX123456, Basic Metabolic Panel (BMET), CBC with Differential/Platelet, INR/PT, DG  Chest 2 View Chronic baseline shortness of breath with exertion, perhaps mildly worse over the past year Slight increase in lower extremity edema  Coronary artery disease involving native coronary artery of native heart without angina pectoris Anginal symptoms as above Plan for catheterization Last catheterization 2011 at outside facility History of LAD and RCA stent  Congestive dilated cardiomyopathy ([place]) Clinical exam with mild fluid overload, trace pitting edema of the legs, he feels as above his baseline. We'll get blood work before advancing his diuretic.  Hyperlipidemia Cholesterol  slightly above goal,  Numbers would improve with better diabetes control  Discussed his diet with him ,   Need for weight loss   ICD (implantable cardioverter-defibrillator) in place  recent generator change in 2016   Type 2 diabetes mellitus with other circulatory complication, with long-term current use of insulin (HCC)  hemoglobin A1c greater than 9 Dietary indiscretion  OSA on CPAP  stressed importance of compliance on his CPAP

## 2016-05-15 NOTE — Telephone Encounter (Signed)
Left message for patient to schedule appointment for medication with Dr. Lacinda Axon.

## 2016-05-15 NOTE — Telephone Encounter (Signed)
Appt scheduled for 08/30

## 2016-05-16 ENCOUNTER — Encounter: Payer: Self-pay | Admitting: Surgery

## 2016-05-16 ENCOUNTER — Encounter: Payer: Self-pay | Admitting: Family Medicine

## 2016-05-16 ENCOUNTER — Other Ambulatory Visit: Payer: Self-pay | Admitting: *Deleted

## 2016-05-16 ENCOUNTER — Institutional Professional Consult (permissible substitution) (INDEPENDENT_AMBULATORY_CARE_PROVIDER_SITE_OTHER): Payer: BLUE CROSS/BLUE SHIELD | Admitting: Surgery

## 2016-05-16 ENCOUNTER — Ambulatory Visit (INDEPENDENT_AMBULATORY_CARE_PROVIDER_SITE_OTHER): Payer: BLUE CROSS/BLUE SHIELD | Admitting: Family Medicine

## 2016-05-16 VITALS — BP 124/70 | HR 88 | Resp 16 | Ht 72.0 in | Wt 300.0 lb

## 2016-05-16 DIAGNOSIS — I2510 Atherosclerotic heart disease of native coronary artery without angina pectoris: Secondary | ICD-10-CM | POA: Diagnosis not present

## 2016-05-16 DIAGNOSIS — F411 Generalized anxiety disorder: Secondary | ICD-10-CM

## 2016-05-16 MED ORDER — ALPRAZOLAM 1 MG PO TABS: 1.0000 mg | ORAL_TABLET | Freq: Three times a day (TID) | ORAL | 2 refills | Status: DC | PRN

## 2016-05-16 NOTE — Progress Notes (Signed)
Subjective:  Patient ID: Brian Williams, male    DOB: 09/17/1961  Age: 55 y.o. MRN: [id number]  CC: Anxiety  HPI:  55 year old male with a complicated past medical history including congestive heart failure, severe CAD, Anxiety, HTN, HLD, uncontrolled DM-2 with complications presents with complaints of anxiety.  Patient recently underwent cardiac catheterization for symptoms of unstable angina. Catheterization revealed multivessel disease and severe in-stent restenosis (proximal LAD). It was recommended that he have bypass surgery.  Since receiving the news, patient has been incredibly anxious. He has been using Xanax as needed without significant improvement. He states that the dose is not sufficient. He is having severe anxiety and difficulty sleeping. He states that all he can think about his upcoming surgery. He would like to discuss treatment options today.  Social Hx   Social History   Social History  . Marital status: Married    Spouse name: N/A  . Number of children: N/A  . Years of education: N/A   Social History Main Topics  . Smoking status: Former Smoker    Packs/day: 0.50    Types: Cigarettes  . Smokeless tobacco: Never Used  . Alcohol use 0.0 - 0.6 oz/week  . Drug use: No  . Sexual activity: Not Asked   Other Topics Concern  . None   Social History Narrative   Married.   Moved from [place].   Disabled.      Review of Systems  Constitutional: Negative.   Respiratory: Positive for shortness of breath.   Cardiovascular: Negative for chest pain.  Psychiatric/Behavioral: The patient is nervous/anxious.    Objective:  BP 102/65 (BP Location: Right Arm, Patient Position: Sitting, Cuff Size: Large)   Pulse 86   Temp 98 F (36.7 C) (Oral)   Wt (!) 300 lb 8 oz (136.3 kg)   SpO2 93%   BMI 40.76 kg/m   BP/Weight 05/16/2016 05/10/2016 [id number]  Systolic BP [id number] [id number]  Diastolic BP 65 79 58  Wt. (Lbs) 300.5 298 299  BMI 40.76 40.42 40.55   Physical Exam    Constitutional: He is oriented to person, place, and time. He appears well-developed. No distress.  Cardiovascular: Normal rate.   Pulmonary/Chest: Effort normal.  Neurological: He is alert and oriented to person, place, and time.  Psychiatric:  Anxious.   Lab Results  Component Value Date   WBC 5.9 05/04/2016   HCT 36.5 (L) 05/04/2016   PLT 202 05/04/2016   GLUCOSE 198 (H) 05/04/2016   CHOL 171 10/06/2015   TRIG (H) 10/06/2015    574.0 Triglyceride is over 400; calculations on Lipids are invalid.   HDL 20.50 (L) 10/06/2015   LDLDIRECT 65.0 10/06/2015   ALT 24 06/03/2015   AST 24 06/03/2015   NA 139 05/04/2016   K 4.7 05/04/2016   CL 102 05/04/2016   CREATININE 0.77 05/04/2016   BUN 13 05/04/2016   CO2 17 (L) 05/04/2016   TSH 3.210 06/03/2015   INR 1.0 05/04/2016   Assessment & Plan:   Problem List Items Addressed This Visit    Generalized anxiety disorder    Established problem, worsening. Increasing Xanax to 1 mg TID PRN.       Other Visit Diagnoses   None.     Meds ordered this encounter  Medications  . ALPRAZolam (XANAX) 1 MG tablet    Sig: Take 1 tablet (1 mg total) by mouth 3 (three) times daily as needed.    Dispense:  90 tablet    Refill:  2    Not to exceed 4 additional fills before 06/03/2016.   Follow-up: PRN  [place]

## 2016-05-16 NOTE — Progress Notes (Signed)
Pre visit review using our clinic review tool, if applicable. No additional management support is needed unless otherwise documented below in the visit note. 

## 2016-05-16 NOTE — Assessment & Plan Note (Signed)
Established problem, worsening. Increasing Xanax to 1 mg TID PRN.

## 2016-05-16 NOTE — Patient Instructions (Signed)
Good luck on your upcoming procedure.  I have increase your Xanax. Use it 3 times daily as needed.  Take care  Dr. Lacinda Axon

## 2016-05-19 ENCOUNTER — Encounter: Payer: Self-pay | Admitting: Surgery

## 2016-05-19 NOTE — Progress Notes (Signed)
Cardiothoracic Surgery Consultation  PCP is [name], DO Referring Provider is Minna Merritts, MD  Chief Complaint  Patient presents with  . Coronary Artery Disease    Surgical eval, Cardiac Cath 05/10/2016    HPI:  The patient is a 55 year old gentleman with a history of poorly controlled type 2 DM, hypertension and hyperlipidemia, smoking, coronary artery disease and ischemic cardiomyopathy dating back to 2002. He had distal RCA disease and had a stent placed. He had multiple caths over the next several years for in-stent restenosis and reports having PTCA and brachytherapy. He also had a stent placed in the LAD. A cath in 12/2009 reportedly showed patent LAD and RCA stents. He had an EF of 20-25% by echo in March 2016. He has a history of VT and underwent a VT ablation at [facility] in the past. He had an ICD placed. He reports being seen by the heart failure team at [facility] and [facility]. He now reports 6-8 weeks of burning in his chest with exertion such as walking. It radiates into his throat. This resolves with rest. This burning is different from his prior chest pressure. There is associated shortness of breath and fatigue. He saw Dr. Rockey Situ and underwent cath on 05/10/2016 showing a 70% eccentric distal LM stenosis, 80% proximal LAD in-stent restenosis, 80% diagonal stenosis, and tandem 90% proximal and mid RCA stenoses with mild in-stent narrowing. The LVEF is visually 35-45%.  Past Medical History:  Diagnosis Date  . AICD (automatic cardioverter/defibrillator) present   . CHF (congestive heart failure) ([place])   . Chicken pox   . Coronary artery disease   . Depression   . Diabetes mellitus without complication ([place])   . ED (erectile dysfunction)   . GERD (gastroesophageal reflux disease)   . Heart disease   . Hyperlipidemia   . Hypertension   . MI (myocardial infarction) ([place])   . Sleep apnea   . Ventricular tachycardia ([place])    s/p RFCA PVCs 2013    Past  Surgical History:  Procedure Laterality Date  . BACK SURGERY    . CARDIAC CATHETERIZATION    . CARDIAC CATHETERIZATION Left 05/10/2016   Procedure: Left Heart Cath and Coronary Angiography;  Surgeon: Minna Merritts, MD;  Location: [facility];  Service: Cardiovascular;  Laterality: Left;  . CARDIAC DEFIBRILLATOR PLACEMENT  10/16/2007   ICD Model number 2207-36 serial number [id number]  . CARDIAC ELECTROPHYSIOLOGY STUDY AND ABLATION    . CHOLECYSTECTOMY  2001  . CORONARY ANGIOPLASTY WITH STENT PLACEMENT    . EP IMPLANTABLE DEVICE N/A 06/16/2015   Procedure: ICD Generator Changeout;  Surgeon: Deboraha Sprang, MD;  Location: [facility];  Service: Cardiovascular;  Laterality: N/A;  . INSERT / REPLACE / REMOVE PACEMAKER    . KNEE SURGERY     bilateral knee     Family History  Problem Relation Age of Onset  . Heart attack Mother 65  . Hypertension Mother   . Heart attack Father 29  . Hypertension Father   . Hypertension Maternal Uncle   . Heart disease Maternal Uncle   . Heart disease Maternal Grandmother   . Stroke Maternal Grandmother     Social History Social History  Substance Use Topics  . Smoking status: Former Smoker    Packs/day: 0.50    Types: Cigarettes  . Smokeless tobacco: Never Used  . Alcohol use 0.0 - 0.6 oz/week    Current Outpatient  Prescriptions  Medication Sig Dispense Refill  . ALPRAZolam (XANAX) 1 MG tablet Take 1 tablet (1 mg total) by mouth 3 (three) times daily as needed. (Patient taking differently: Take 1 mg by mouth 3 (three) times daily as needed for anxiety. ) 90 tablet 2  . aspirin EC 81 MG tablet Take 162 mg by mouth daily.     . carvedilol (COREG) 25 MG tablet Take 1 tablet (25 mg total) by mouth 2 (two) times daily with a meal. 60 tablet 3  . clopidogrel (PLAVIX) 75 MG tablet Take 75 mg by mouth daily.     . dapagliflozin propanediol (FARXIGA) 5 MG TABS tablet Take 5 mg by mouth daily.     Marland Kitchen ezetimibe (ZETIA) 10 MG tablet Take 1  tablet (10 mg total) by mouth daily. 90 tablet 4  . fenofibrate (TRICOR) 145 MG tablet Take 145 mg by mouth daily.     . furosemide (LASIX) 40 MG tablet Take 40 mg by mouth daily.     Marland Kitchen gemfibrozil (LOPID) 600 MG tablet Take 1 tablet (600 mg total) by mouth daily. 90 tablet 3  . insulin regular human CONCENTRATED (HUMULIN R) 500 UNIT/ML injection Continuous rate plus boluses on a sliding scale.    . nitroGLYCERIN (NITROSTAT) 0.4 MG SL tablet Place 0.4 mg under the tongue every 5 (five) minutes as needed.     . potassium chloride (KLOR-CON M10) 10 MEQ tablet Take 10 mEq by mouth daily.     . rosuvastatin (CRESTOR) 40 MG tablet Take 1 tablet (40 mg total) by mouth daily. 90 tablet 3  . sacubitril-valsartan (ENTRESTO) 49-51 MG Take 1 tablet by mouth 2 (two) times daily. 60 tablet 6  . sertraline (ZOLOFT) 100 MG tablet TAKE 1 TABLET (100 MG TOTAL) BY MOUTH DAILY. 30 tablet 2  . spironolactone (ALDACTONE) 25 MG tablet Take 1 tablet (25 mg total) by mouth daily. 90 tablet 3  . Cholecalciferol (VITAMIN D) 2000 units tablet Take 2,000 Units by mouth daily.     No current facility-administered medications for this visit.     Allergies  Allergen Reactions  . Morphine And Related Nausea And Vomiting  . Naproxen Other (See Comments)    Causes hyperactivity    Review of Systems  Constitutional: Positive for activity change, diaphoresis and fatigue. Negative for appetite change.  HENT: Negative.   Eyes: Negative.   Respiratory: Positive for shortness of breath.        Uses CPAP at night  Cardiovascular: Positive for chest pain and leg swelling.       Orthopnea  Gastrointestinal:       Reflux and heartburn  Endocrine: Negative.   Genitourinary: Negative.   Musculoskeletal: Positive for myalgias.  Skin: Negative.   Allergic/Immunologic: Negative.   Neurological: Positive for dizziness.       Neuropathy  Memory problems  Hematological: Negative.   Psychiatric/Behavioral: Positive for  decreased concentration, dysphoric mood and sleep disturbance. The patient is nervous/anxious.     BP 124/70   Pulse 88   Resp 16   Ht 6' (1.829 m)   Wt 300 lb (136.1 kg)   SpO2 97% Comment: RA  BMI 40.69 kg/m  Physical Exam  Constitutional: He is oriented to person, place, and time.  Morbidly obese  Very anxious  HENT:  Head: Normocephalic and atraumatic.  Mouth/Throat: Oropharynx is clear and moist.  Eyes: Conjunctivae and EOM are normal. Pupils are equal, round, and reactive to light.  Neck: Normal range of  motion. Neck supple. No JVD present. No thyromegaly present.  Cardiovascular: Normal rate, regular rhythm, normal heart sounds and intact distal pulses.   No murmur heard. Pulmonary/Chest: Effort normal. No respiratory distress. He has no wheezes.  Abdominal: Soft. Bowel sounds are normal. He exhibits no distension and no mass. There is no tenderness.  Musculoskeletal: Normal range of motion. He exhibits edema.  Lymphadenopathy:    He has no cervical adenopathy.  Neurological: He is alert and oriented to person, place, and time. He has normal strength. No cranial nerve deficit or sensory deficit.  Skin: Skin is warm. He is diaphoretic.  Psychiatric:  Very anxious    Diagnostic Tests:  Minna Merritts, MD (Primary)    Procedures   Left Heart Cath and Coronary Angiography  Conclusion    Dist Cx lesion, 70 %stenosed.  Prox LAD lesion, 80 %stenosed.  Ost LM to LM lesion, 70 %stenosed.  Prox RCA lesion, 90 %stenosed.  Mid RCA lesion, 90 %stenosed.  Mid RCA to Dist RCA lesion, 30 %stenosed.  The left ventricular ejection fraction is 35-45% by visual estimate.  1st Diag lesion, 80 %stenosed.    Procedural Details/Technique   Technical Details Cardiac Catheterization Procedure Note  Name: Brian Williams MRN: [id number] DOB: [birth year]  Procedure: Left Heart Cath, Selective Coronary Angiography, LV angiography  Indication: 55 yo male with known CAD, PCI  to the LAD and distal RCA, Previously seen in [place] for his cardiac issues. History dates back to 2002 when he had distal RCA disease. Several catheterizations over the next several years for in-stent restenosis of the distal RCA. Later cath, stent placed to the LAD. Most recent catheterization appears to be April 2011 showing patent LAD and RCA stent. History of sustained VT, VT ablation in [place] at [facility] History of sleep apnea, on CPAP,ICD in place, recent ICD generator change in 2016 previously seen by seen by [facility] cardiology heart failure/transplant team, [name].  Presenting with increasing SOB, fatigue, chest pain concerning for unstable angina. He did not want stress testing   Procedural details: The right groin was prepped, draped, and anesthetized with 1% lidocaine. Using modified Seldinger technique, a 5 French sheath was introduced into the right femoral artery. Standard Judkins catheters (JL 5, JR 5 and pigtail catheter) were used for coronary angiography and left ventriculography. Catheter exchanges were performed over a guidewire. There were no immediate procedural complications. The patient was transferred to the post catheterization recovery area for further monitoring.  Moderate sedation: I was Face to Face with the patient during this time: (code: [id number])   Procedural Findings:  Coronary angiography:  Coronary dominance: Right  Left mainstem: Large vessel that bifurcates into the LAD and left circumflex, 60% eccentric distal left main disease  Left anterior descending (LAD): Large vessel that extends to the apical region, diagonal branch 1 of moderate size. LAD with 80% diffuse ISR of proximal stent, also with 80% proximal D1 disease.  Left circumflex (LCx): Large vessel with OM branch 2, with 70% mid OM 2 disease.  Right coronary artery (RCA): Right dominant vessel with PL and PDA, severe proximal and mid disease estimated at 90%, moderately  calcified, patent distal stent with 30% ISR  Left ventriculography: Left ventricular systolic function is moderately depressed, LVEF is estimated at 35 to 40%, there is no significant mitral regurgitation , no significant aortic valve stenosis Akinesis of the inferior wall  Final Conclusions:  Severe proximal LAD disease (ISR) Severe proximal and mid RAC disease Severe OM2 disease  Severe proximal diagonal #1 disease Moderate to severe left main disease  Recommendations:  Will refer for CABG given left main disease, numerous severe lesions in LAD, diagonal and RCA. -will hold plavix in preparation Referral placed  [place] 05/10/2016, 9:49 AM   Estimated blood loss <50 mL. . During this procedure the patient was administered the following to achieve and maintain moderate conscious sedation: Versed 2 mg, Fentanyl 100 mcg, while the patient's heart rate, blood pressure, and oxygen saturation were continuously monitored. The period of conscious sedation was 40 minutes, of which I was present face-to-face 100% of this time.    Coronary Findings   Dominance: Co-dominant  Left Main  Ost LM to LM lesion, 70% stenosed.  Left Anterior Descending  Prox LAD lesion, 80% stenosed. The lesion was previously treated.  First Diagonal Branch  1st Diag lesion, 80% stenosed.  Left Circumflex  Dist Cx lesion, 70% stenosed.  Right Coronary Artery  Prox RCA lesion, 90% stenosed.  Mid RCA lesion, 90% stenosed.  Mid RCA to Dist RCA lesion, 30% stenosed. The lesion was previously treated.  Wall Motion              Left Heart   Left Ventricle The left ventricular ejection fraction is 35-45% by visual estimate.    Coronary Diagrams   Diagnostic Diagram       Impression:  I have personally reviewed and interpreted his recent cardiac cath. This 55 year old diabetic gentleman has 70% LM and severe 3 vessel coronary disease with moderate LV dysfunction and progressive  anginal symptoms. He has a history of ischemic cardiomyopathy with an EF of 25% in the past but his LV function appears improved. I agree that CABG is the best treatment for him. His operative risk is increased due to his morbid obesity, poorly controlled DM, long history of heavy smoking, and reduced EF. I reviewed his cath with him and my treatment recommendations. I spent a long time answering all of his questions and trying a calm his anxiety about surgery. I discussed the operative procedure with the patient and his wife including alternatives, benefits and risks; including but not limited to bleeding, blood transfusion, infection, stroke, myocardial infarction, graft failure, heart block requiring a permanent pacemaker, organ dysfunction, and death.  Brian Williams understands and agrees to proceed.  We will schedule surgery for 05/24/2016.  Plan:  CABG on 05/24/2016   I spent 80 minutes performing this consultation and > 50% of this time was spent face to face counseling and coordinating the care of this patient's left main and severe multi-vessel coronary artery disease.   Gaye Pollack, MD [facility] [phone]

## 2016-05-22 ENCOUNTER — Ambulatory Visit (HOSPITAL_COMMUNITY)
Admission: RE | Admit: 2016-05-22 | Discharge: 2016-05-22 | Disposition: A | Discharge status: Home / Self Care | Payer: BLUE CROSS/BLUE SHIELD | Source: Ambulatory Visit | Attending: Surgery | Admitting: Surgery

## 2016-05-22 ENCOUNTER — Ambulatory Visit (HOSPITAL_BASED_OUTPATIENT_CLINIC_OR_DEPARTMENT_OTHER)
Admission: RE | Admit: 2016-05-22 | Discharge: 2016-05-22 | Disposition: A | Discharge status: Home / Self Care | Payer: BLUE CROSS/BLUE SHIELD | Source: Ambulatory Visit | Attending: Surgery | Admitting: Surgery

## 2016-05-22 ENCOUNTER — Encounter (HOSPITAL_COMMUNITY): Payer: Self-pay

## 2016-05-22 ENCOUNTER — Encounter (HOSPITAL_COMMUNITY)
Admission: RE | Admit: 2016-05-22 | Discharge: 2016-05-22 | Disposition: A | Discharge status: Home / Self Care | Payer: BLUE CROSS/BLUE SHIELD | Source: Ambulatory Visit | Attending: Surgery | Admitting: Surgery

## 2016-05-22 DIAGNOSIS — I2510 Atherosclerotic heart disease of native coronary artery without angina pectoris: Secondary | ICD-10-CM

## 2016-05-22 DIAGNOSIS — Z01812 Encounter for preprocedural laboratory examination: Secondary | ICD-10-CM

## 2016-05-22 DIAGNOSIS — Z0183 Encounter for blood typing: Secondary | ICD-10-CM

## 2016-05-22 DIAGNOSIS — Z01818 Encounter for other preprocedural examination: Secondary | ICD-10-CM

## 2016-05-22 HISTORY — DX: Anxiety disorder, unspecified: F41.9

## 2016-05-22 HISTORY — DX: Headache: R51

## 2016-05-22 HISTORY — DX: Reserved for inherently not codable concepts without codable children: IMO0001

## 2016-05-22 HISTORY — DX: Other specified postprocedural states: Z98.890

## 2016-05-22 HISTORY — DX: Personal history of colonic polyps: Z86.010

## 2016-05-22 HISTORY — DX: Personal history of colon polyps, unspecified: Z86.0100

## 2016-05-22 HISTORY — DX: Personal history of other diseases of the digestive system: Z87.19

## 2016-05-22 HISTORY — DX: Personal history of other diseases of the respiratory system: Z87.09

## 2016-05-22 HISTORY — DX: Other specified postprocedural states: R11.2

## 2016-05-22 HISTORY — DX: Presence of cardiac pacemaker: Z95.0

## 2016-05-22 HISTORY — DX: Polyneuropathy, unspecified: G62.9

## 2016-05-22 HISTORY — DX: Unspecified osteoarthritis, unspecified site: M19.90

## 2016-05-22 HISTORY — DX: Headache, unspecified: R51.9

## 2016-05-22 HISTORY — DX: Dorsalgia, unspecified: M54.9

## 2016-05-22 HISTORY — DX: Pneumonia, unspecified organism: J18.9

## 2016-05-22 LAB — BLOOD GAS, ARTERIAL
Acid-base deficit: 1.5 mmol/L (ref 0.0–2.0)
Bicarbonate: 22.2 mmol/L (ref 20.0–28.0)
Drawn by: 421801
FIO2: 0.21
O2 Saturation: 96.6 %
Patient temperature: 98.6
pCO2 arterial: 33.8 mmHg (ref 32.0–48.0)
pH, Arterial: 7.433 (ref 7.350–7.450)
pO2, Arterial: 82.7 mmHg — ABNORMAL LOW (ref 83.0–108.0)

## 2016-05-22 LAB — PROTIME-INR
INR: 1.07
Prothrombin Time: 14 seconds (ref 11.4–15.2)

## 2016-05-22 LAB — COMPREHENSIVE METABOLIC PANEL
ALT: 24 U/L (ref 17–63)
AST: 23 U/L (ref 15–41)
Albumin: 4.1 g/dL (ref 3.5–5.0)
Alkaline Phosphatase: 33 U/L — ABNORMAL LOW (ref 38–126)
Anion gap: 10 (ref 5–15)
BUN: 11 mg/dL (ref 6–20)
CO2: 20 mmol/L — ABNORMAL LOW (ref 22–32)
Calcium: 9.7 mg/dL (ref 8.9–10.3)
Chloride: 109 mmol/L (ref 101–111)
Creatinine, Ser: 0.74 mg/dL (ref 0.61–1.24)
GFR calc Af Amer: >60 mL/min (ref >60)
GFR calc non Af Amer: >60 mL/min (ref >60)
Glucose, Bld: 99 mg/dL (ref 65–99)
Potassium: 3.9 mmol/L (ref 3.5–5.1)
Sodium: 139 mmol/L (ref 135–145)
Total Bilirubin: 0.3 mg/dL (ref 0.3–1.2)
Total Protein: 7 g/dL (ref 6.5–8.1)

## 2016-05-22 LAB — PULMONARY FUNCTION TEST
DL/VA % pred: 94 %
DL/VA: 4.48 ml/min/mmHg/L
DLCO cor % pred: 67 %
DLCO cor: 23.69 ml/min/mmHg
DLCO unc % pred: 62 %
DLCO unc: 21.83 ml/min/mmHg
FEF 25-75 Post: 3.95 L/sec
FEF 25-75 Pre: 3.2 L/sec
FEF2575-%Change-Post: 23 %
FEF2575-%Pred-Post: 116 %
FEF2575-%Pred-Pre: 94 %
FEV1-%Change-Post: 3 %
FEV1-%Pred-Post: 75 %
FEV1-%Pred-Pre: 72 %
FEV1-Post: 3.04 L
FEV1-Pre: 2.93 L
FEV1FVC-%Change-Post: 5 %
FEV1FVC-%Pred-Pre: 106 %
FEV6-%Change-Post: -1 %
FEV6-%Pred-Post: 69 %
FEV6-%Pred-Pre: 70 %
FEV6-Post: 3.53 L
FEV6-Pre: 3.58 L
FEV6FVC-%Change-Post: 0 %
FEV6FVC-%Pred-Post: 104 %
FEV6FVC-%Pred-Pre: 104 %
FVC-%Change-Post: -1 %
FVC-%Pred-Post: 67 %
FVC-%Pred-Pre: 68 %
FVC-Post: 3.53 L
FVC-Pre: 3.58 L
Post FEV1/FVC ratio: 86 %
Post FEV6/FVC ratio: 100 %
Pre FEV1/FVC ratio: 82 %
Pre FEV6/FVC Ratio: 100 %
RV % pred: 65 %
RV: 1.46 L
TLC % pred: 71 %
TLC: 5.29 L

## 2016-05-22 LAB — SURGICAL PCR SCREEN
MRSA, PCR: NEGATIVE
Staphylococcus aureus: NEGATIVE

## 2016-05-22 LAB — CBC
HCT: 36.4 % — ABNORMAL LOW (ref 39.0–52.0)
Hemoglobin: 12.1 g/dL — ABNORMAL LOW (ref 13.0–17.0)
MCH: 31 pg (ref 26.0–34.0)
MCHC: 33.2 g/dL (ref 30.0–36.0)
MCV: 93.3 fL (ref 78.0–100.0)
Platelets: 160 10*3/uL (ref 150–400)
RBC: 3.9 MIL/uL — ABNORMAL LOW (ref 4.22–5.81)
RDW: 14.1 % (ref 11.5–15.5)
WBC: 6.9 10*3/uL (ref 4.0–10.5)

## 2016-05-22 LAB — TYPE AND SCREEN
ABO/RH(D): O POS
Antibody Screen: NEGATIVE

## 2016-05-22 LAB — URINALYSIS, ROUTINE W REFLEX MICROSCOPIC
Bilirubin Urine: NEGATIVE
Glucose, UA: >1000 mg/dL — AB
Hgb urine dipstick: NEGATIVE
Ketones, ur: NEGATIVE mg/dL
Leukocytes, UA: NEGATIVE
Nitrite: NEGATIVE
Protein, ur: NEGATIVE mg/dL
Specific Gravity, Urine: 1.036 — ABNORMAL HIGH (ref 1.005–1.030)
pH: 5.5 (ref 5.0–8.0)

## 2016-05-22 LAB — APTT: aPTT: 29 seconds (ref 24–36)

## 2016-05-22 LAB — URINE MICROSCOPIC-ADD ON

## 2016-05-22 LAB — GLUCOSE, CAPILLARY: Glucose-Capillary: 127 mg/dL — ABNORMAL HIGH (ref 65–99)

## 2016-05-22 LAB — ABO/RH: ABO/RH(D): O POS

## 2016-05-22 MED ORDER — ALBUTEROL SULFATE (2.5 MG/3ML) 0.083% IN NEBU
2.5000 mg | INHALATION_SOLUTION | Freq: Once | RESPIRATORY_TRACT | Status: AC
  Administered 2016-05-22: 2.5 mg via RESPIRATORY_TRACT

## 2016-05-22 MED ORDER — CHLORHEXIDINE GLUCONATE 4 % EX LIQD: 30.0000 mL | CUTANEOUS | Status: DC

## 2016-05-22 NOTE — Pre-Procedure Instructions (Signed)
[facility]  05/22/2016      [id number] - [place], [place] - [address] [place] [address] Phone: [phone] Fax: [phone]    Your procedure is scheduled on Thurs, Sept 7 @ 7:30 AM  Report to [facility] Admitting at 5:30 AM  Call this number if you have problems the morning of surgery:  [phone]   Remember:  Do not eat food or drink liquids after midnight.  Take these medicines the morning of surgery with A SIP OF WATER Alprazolam(Xanax),Sertraline(Zoloft),and Carvedilol(Coreg)             No Goody's,BC's,Aleve,Advil,Motrin,Ibuprofen,Fish Oil,or any Herbal Medications.      How to Manage Your Diabetes Before and After Surgery  Why is it important to control my blood sugar before and after surgery? . Improving blood sugar levels before and after surgery helps healing and can limit problems. . A way of improving blood sugar control is eating a healthy diet by: o  Eating less sugar and carbohydrates o  Increasing activity/exercise o  Talking with your doctor about reaching your blood sugar goals . High blood sugars (greater than 180 mg/dL) can raise your risk of infections and slow your recovery, so you will need to focus on controlling your diabetes during the weeks before surgery. . Make sure that the doctor who takes care of your diabetes knows about your planned surgery including the date and location.  How do I manage my blood sugar before surgery? . Check your blood sugar at least 4 times a day, starting 2 days before surgery, to make sure that the level is not too high or low. o Check your blood sugar the morning of your surgery when you wake up and every 2 hours until you get to the Short Stay unit. . If your blood sugar is less than 70 mg/dL, you will need to treat for low blood sugar: o Do not take insulin. o Treat a low blood sugar (less than 70 mg/dL) with  cup of clear juice (cranberry or apple), 4 glucose  tablets, OR glucose gel. o Recheck blood sugar in 15 minutes after treatment (to make sure it is greater than 70 mg/dL). If your blood sugar is not greater than 70 mg/dL on recheck, call [phone] for further instructions. . Report your blood sugar to the short stay nurse when you get to Short Stay.  . If you are admitted to the hospital after surgery: o Your blood sugar will be checked by the staff and you will probably be given insulin after surgery (instead of oral diabetes medicines) to make sure you have good blood sugar levels. o The goal for blood sugar control after surgery is 80-180 mg/dL.              WHAT DO I DO ABOUT MY DIABETES MEDICATION?   Marland Kitchen Do not take oral diabetes medicines (pills) the morning of surgery.           . The day of surgery, do not take other diabetes injectables, including Byetta (exenatide), Bydureon (exenatide ER), Victoza (liraglutide), or Trulicity (dulaglutide).  . If your CBG is greater than 220 mg/dL, you may take  of your sliding scale (correction) dose of insulin.  Other Instructions:          Patient Signature:  Date:   Nurse Signature:  Date:   Reviewed and Endorsed by [facility] Patient Education Committee, August 2015   Do  not wear jewelry.  Do not wear lotions, powders, or colognes, or deoderant.   Men may shave face and neck.  Do not bring valuables to the hospital.  [facility] is not responsible for any belongings or valuables.  Contacts, dentures or bridgework may not be worn into surgery.  Leave your suitcase in the car.  After surgery it may be brought to your room.  For patients admitted to the hospital, discharge time will be determined by your treatment team.  Patients discharged the day of surgery will not be allowed to drive home.   [facility] - Preparing for [facility]  Before surgery, you can play an important role.  Because skin is not sterile, your skin needs to be as free of  germs as possible.  You can reduce the number of germs on you skin by washing with CHG (chlorahexidine gluconate) soap before surgery.  CHG is an antiseptic cleaner which kills germs and bonds with the skin to continue killing germs even after washing.  Please DO NOT use if you have an allergy to CHG or antibacterial soaps.  If your skin becomes reddened/irritated stop using the CHG and inform your nurse when you arrive at Short Stay.  Do not shave (including legs and underarms) for at least 48 hours prior to the first CHG shower.  You may shave your face.  Please follow these instructions carefully:   1.  Shower with CHG Soap the night before surgery and the                                morning of Surgery.  2.  If you choose to wash your hair, wash your hair first as usual with your       normal shampoo.  3.  After you shampoo, rinse your hair and body thoroughly to remove the                      Shampoo.  4.  Use CHG as you would any other liquid soap.  You can apply chg directly       to the skin and wash gently with scrungie or a clean washcloth.  5.  Apply the CHG Soap to your body ONLY FROM THE NECK DOWN.        Do not use on open wounds or open sores.  Avoid contact with your eyes,       ears, mouth and genitals (private parts).  Wash genitals (private parts)       with your normal soap.  6.  Wash thoroughly, paying special attention to the area where your surgery        will be performed.  7.  Thoroughly rinse your body with warm water from the neck down.  8.  DO NOT shower/wash with your normal soap after using and rinsing off       the CHG Soap.  9.  Pat yourself dry with a clean towel.            10.  Wear clean pajamas.            11.  Place clean sheets on your bed the night of your first shower and do not        sleep with pets.  Day of Surgery  Do not apply any lotions/deoderants the morning of surgery.  Please wear clean clothes to the hospital/surgery  center.    Please  read over the following fact sheets that you were given. Pain Booklet, Coughing and Deep Breathing and MRSA Information

## 2016-05-22 NOTE — Progress Notes (Signed)
Pre-op Cardiac Surgery  Carotid Findings:   Findings are consistent with a 1-39 percent stenosis involving the right internal carotid artery and the left internal carotid artery.   Upper Extremity Right Left  Brachial Pressures 142 136  Radial Waveforms Triphasic Triphasic  Ulnar Waveforms Triphasic Triphasic  Palmar Arch (Allen's Test) Palmar waveforms remain within normal limits with radial compression and are obliterated with ulnar compression. Palmar waveforms remain within normal limits with radial compression and are decreased greater than 50 percent with ulnar compression.    Lower  Extremity Right Left  Dorsalis Pedis 147 Triphasic 141 Triphasic  Posterior Tibial 153 Triphasic 154 Triphasic  Ankle/Brachial Indices 1.08 1.08    Findings:   Bilateral ABI's and waveforms suggest normal results at rest.

## 2016-05-22 NOTE — Progress Notes (Addendum)
Cardiologist is Dr.Gollan last visit in epic from 05-05-15  Medical Md is Dr. Thersa Salt  Echo report in epic from 2016  Stress test report in epic from 2015  Heart cath report in epic from 2016/2017  EKG in epic from 04-24-16

## 2016-05-22 NOTE — Progress Notes (Signed)
Notified [name] with [facility] about surgery on 05/24/16 @ 7:30 AM with pt arrival time of 5:30 AM

## 2016-05-23 LAB — HEMOGLOBIN A1C
Hgb A1c MFr Bld: 9 % — ABNORMAL HIGH (ref 4.8–5.6)
Mean Plasma Glucose: 212 mg/dL

## 2016-05-23 LAB — VAS US DOPPLER PRE CABG
LEFT VERTEBRAL DIAS: -14 cm/s
Left CCA dist dias: -17 cm/s
Left CCA dist sys: -67 cm/s
Left CCA prox dias: 19 cm/s
Left CCA prox sys: 112 cm/s
Left ICA dist dias: -26 cm/s
Left ICA dist sys: -78 cm/s
Left ICA prox dias: -24 cm/s
Left ICA prox sys: -75 cm/s
RIGHT ECA DIAS: -18 cm/s
RIGHT VERTEBRAL DIAS: -9 cm/s
Right CCA prox dias: -15 cm/s
Right CCA prox sys: -86 cm/s
Right cca dist sys: -81 cm/s

## 2016-05-23 MED ORDER — DOPAMINE-DEXTROSE 3.2-5 MG/ML-% IV SOLN
0.0000 ug/kg/min | INTRAVENOUS | Status: DC
  Filled 2016-05-23: qty 250

## 2016-05-23 MED ORDER — PLASMA-LYTE 148 IV SOLN
INTRAVENOUS | Status: AC
  Administered 2016-05-24: 500 mL
  Filled 2016-05-23: qty 2.5

## 2016-05-23 MED ORDER — DEXMEDETOMIDINE HCL IN NACL 400 MCG/100ML IV SOLN
0.1000 ug/kg/h | INTRAVENOUS | Status: AC
  Administered 2016-05-24: 13:00:00 via INTRAVENOUS
  Administered 2016-05-24: .3 ug/kg/h via INTRAVENOUS
  Filled 2016-05-23 (×2): qty 100

## 2016-05-23 MED ORDER — DEXTROSE 5 % IV SOLN
750.0000 mg | INTRAVENOUS | Status: DC
  Filled 2016-05-23: qty 750

## 2016-05-23 MED ORDER — CHLORHEXIDINE GLUCONATE 0.12 % MT SOLN
15.0000 mL | Freq: Once | OROMUCOSAL | Status: AC
  Administered 2016-05-24: 15 mL via OROMUCOSAL
  Filled 2016-05-23: qty 15

## 2016-05-23 MED ORDER — SODIUM CHLORIDE 0.9 % IV SOLN
INTRAVENOUS | Status: AC
  Administered 2016-05-24: .5 [IU]/h via INTRAVENOUS
  Filled 2016-05-23: qty 2.5

## 2016-05-23 MED ORDER — PHENYLEPHRINE HCL 10 MG/ML IJ SOLN
30.0000 ug/min | INTRAVENOUS | Status: DC
  Filled 2016-05-23: qty 2

## 2016-05-23 MED ORDER — MAGNESIUM SULFATE 50 % IJ SOLN
40.0000 meq | INTRAMUSCULAR | Status: DC
  Filled 2016-05-23: qty 10

## 2016-05-23 MED ORDER — EPINEPHRINE HCL 1 MG/ML IJ SOLN
0.0000 ug/min | INTRAVENOUS | Status: DC
  Filled 2016-05-23: qty 4

## 2016-05-23 MED ORDER — SODIUM CHLORIDE 0.9 % IV SOLN
INTRAVENOUS | Status: DC
  Filled 2016-05-23: qty 30

## 2016-05-23 MED ORDER — VANCOMYCIN HCL 10 G IV SOLR
1500.0000 mg | INTRAVENOUS | Status: AC
  Administered 2016-05-24: 1500 mg via INTRAVENOUS
  Filled 2016-05-23: qty 1500

## 2016-05-23 MED ORDER — DEXTROSE 5 % IV SOLN
1.5000 g | INTRAVENOUS | Status: AC
  Administered 2016-05-24: 1.5 g via INTRAVENOUS
  Administered 2016-05-24: .75 g via INTRAVENOUS
  Filled 2016-05-23: qty 1.5

## 2016-05-23 MED ORDER — METOPROLOL TARTRATE 12.5 MG HALF TABLET
12.5000 mg | ORAL_TABLET | Freq: Once | ORAL | Status: AC
  Administered 2016-05-24: 12.5 mg via ORAL
  Filled 2016-05-23: qty 1

## 2016-05-23 MED ORDER — POTASSIUM CHLORIDE 2 MEQ/ML IV SOLN
80.0000 meq | INTRAVENOUS | Status: DC
  Filled 2016-05-23: qty 40

## 2016-05-23 MED ORDER — NITROGLYCERIN IN D5W 200-5 MCG/ML-% IV SOLN
2.0000 ug/min | INTRAVENOUS | Status: AC
  Administered 2016-05-24: 5 ug/min via INTRAVENOUS
  Filled 2016-05-23: qty 250

## 2016-05-23 MED ORDER — AMINOCAPROIC ACID 250 MG/ML IV SOLN
INTRAVENOUS | Status: AC
  Administered 2016-05-24: 13:00:00 via INTRAVENOUS
  Administered 2016-05-24: 69.8 mL/h via INTRAVENOUS
  Filled 2016-05-23: qty 40

## 2016-05-23 NOTE — Progress Notes (Signed)
Left a message with Levonne Spiller RN that HGB A1C is 9.0

## 2016-05-23 NOTE — H&P (Signed)
[name]       [place] [address]             [phone]      Cardiothoracic Surgery History and Physical   PCP is [name], DO Referring Provider is Minna Merritts, MD      Chief Complaint  Patient presents with  . Coronary Artery Disease    Surgical eval, Cardiac Cath 05/10/2016    HPI:  The patient is a 55 year old gentleman with a history of poorly controlled type 2 DM, hypertension and hyperlipidemia, smoking, coronary artery disease and ischemic cardiomyopathy dating back to 2002. He had distal RCA disease and had a stent placed. He had multiple caths over the next several years for in-stent restenosis and reports having PTCA and brachytherapy. He also had a stent placed in the LAD. A cath in 12/2009 reportedly showed patent LAD and RCA stents. He had an EF of 20-25% by echo in March 2016. He has a history of VT and underwent a VT ablation at [facility] in the past. He had an ICD placed. He reports being seen by the heart failure team at [facility] and [place]. He now reports 6-8 weeks of burning in his chest with exertion such as walking. It radiates into his throat. This resolves with rest. This burning is different from his prior chest pressure. There is associated shortness of breath and fatigue. He saw Dr. Rockey Situ and underwent cath on 05/10/2016 showing a 70% eccentric distal LM stenosis, 80% proximal LAD in-stent restenosis, 80% diagonal stenosis, and tandem 90% proximal and mid RCA stenoses with mild in-stent narrowing. The LVEF is visually 35-45%.      Past Medical History:  Diagnosis Date  . AICD (automatic cardioverter/defibrillator) present   . CHF (congestive heart failure) ([place])   . Chicken pox   . Coronary artery disease   . Depression   . Diabetes mellitus without complication ([place])   . ED (erectile dysfunction)   . GERD (gastroesophageal reflux disease)   . Heart disease   . Hyperlipidemia   .  Hypertension   . MI (myocardial infarction) ([place])   . Sleep apnea   . Ventricular tachycardia ([place])    s/p RFCA PVCs 2013         Past Surgical History:  Procedure Laterality Date  . BACK SURGERY    . CARDIAC CATHETERIZATION    . CARDIAC CATHETERIZATION Left 05/10/2016   Procedure: Left Heart Cath and Coronary Angiography;  Surgeon: Minna Merritts, MD;  Location: [facility];  Service: Cardiovascular;  Laterality: Left;  . CARDIAC DEFIBRILLATOR PLACEMENT  10/16/2007   ICD Model number 2207-36 serial number [id number]  . CARDIAC ELECTROPHYSIOLOGY STUDY AND ABLATION    . CHOLECYSTECTOMY  2001  . CORONARY ANGIOPLASTY WITH STENT PLACEMENT    . EP IMPLANTABLE DEVICE N/A 06/16/2015   Procedure: ICD Generator Changeout;  Surgeon: Deboraha Sprang, MD;  Location: [facility];  Service: Cardiovascular;  Laterality: N/A;  . INSERT / REPLACE / REMOVE PACEMAKER    . KNEE SURGERY     bilateral knee          Family History  Problem Relation Age of Onset  . Heart attack Mother 83  . Hypertension Mother   . Heart attack Father 24  . Hypertension Father   . Hypertension Maternal Uncle   . Heart disease Maternal Uncle   . Heart disease  Maternal Grandmother   . Stroke Maternal Grandmother     Social History       Social History  Substance Use Topics  . Smoking status: Former Smoker    Packs/day: 0.50    Types: Cigarettes  . Smokeless tobacco: Never Used  . Alcohol use 0.0 - 0.6 oz/week           Current Outpatient Prescriptions  Medication Sig Dispense Refill  . ALPRAZolam (XANAX) 1 MG tablet Take 1 tablet (1 mg total) by mouth 3 (three) times daily as needed. (Patient taking differently: Take 1 mg by mouth 3 (three) times daily as needed for anxiety. ) 90 tablet 2  . aspirin EC 81 MG tablet Take 162 mg by mouth daily.     . carvedilol (COREG) 25 MG tablet Take 1 tablet (25 mg total) by mouth 2 (two) times daily with a meal. 60  tablet 3  . clopidogrel (PLAVIX) 75 MG tablet Take 75 mg by mouth daily.     . dapagliflozin propanediol (FARXIGA) 5 MG TABS tablet Take 5 mg by mouth daily.     Marland Kitchen ezetimibe (ZETIA) 10 MG tablet Take 1 tablet (10 mg total) by mouth daily. 90 tablet 4  . fenofibrate (TRICOR) 145 MG tablet Take 145 mg by mouth daily.     . furosemide (LASIX) 40 MG tablet Take 40 mg by mouth daily.     Marland Kitchen gemfibrozil (LOPID) 600 MG tablet Take 1 tablet (600 mg total) by mouth daily. 90 tablet 3  . insulin regular human CONCENTRATED (HUMULIN R) 500 UNIT/ML injection Continuous rate plus boluses on a sliding scale.    . nitroGLYCERIN (NITROSTAT) 0.4 MG SL tablet Place 0.4 mg under the tongue every 5 (five) minutes as needed.     . potassium chloride (KLOR-CON M10) 10 MEQ tablet Take 10 mEq by mouth daily.     . rosuvastatin (CRESTOR) 40 MG tablet Take 1 tablet (40 mg total) by mouth daily. 90 tablet 3  . sacubitril-valsartan (ENTRESTO) 49-51 MG Take 1 tablet by mouth 2 (two) times daily. 60 tablet 6  . sertraline (ZOLOFT) 100 MG tablet TAKE 1 TABLET (100 MG TOTAL) BY MOUTH DAILY. 30 tablet 2  . spironolactone (ALDACTONE) 25 MG tablet Take 1 tablet (25 mg total) by mouth daily. 90 tablet 3  . Cholecalciferol (VITAMIN D) 2000 units tablet Take 2,000 Units by mouth daily.     No current facility-administered medications for this visit.          Allergies  Allergen Reactions  . Morphine And Related Nausea And Vomiting  . Naproxen Other (See Comments)    Causes hyperactivity    Review of Systems  Constitutional: Positive for activity change, diaphoresis and fatigue. Negative for appetite change.  HENT: Negative.   Eyes: Negative.   Respiratory: Positive for shortness of breath.        Uses CPAP at night  Cardiovascular: Positive for chest pain and leg swelling.       Orthopnea  Gastrointestinal:       Reflux and heartburn  Endocrine: Negative.   Genitourinary: Negative.     Musculoskeletal: Positive for myalgias.  Skin: Negative.   Allergic/Immunologic: Negative.   Neurological: Positive for dizziness.       Neuropathy  Memory problems  Hematological: Negative.   Psychiatric/Behavioral: Positive for decreased concentration, dysphoric mood and sleep disturbance. The patient is nervous/anxious.     BP 124/70   Pulse 88   Resp 16  Ht 6' (1.829 m)   Wt 300 lb (136.1 kg)   SpO2 97% Comment: RA  BMI 40.69 kg/m  Physical Exam  Constitutional: He is oriented to person, place, and time.  Morbidly obese  Very anxious  HENT:  Head: Normocephalic and atraumatic.  Mouth/Throat: Oropharynx is clear and moist.  Eyes: Conjunctivae and EOM are normal. Pupils are equal, round, and reactive to light.  Neck: Normal range of motion. Neck supple. No JVD present. No thyromegaly present.  Cardiovascular: Normal rate, regular rhythm, normal heart sounds and intact distal pulses.   No murmur heard. Pulmonary/Chest: Effort normal. No respiratory distress. He has no wheezes.  Abdominal: Soft. Bowel sounds are normal. He exhibits no distension and no mass. There is no tenderness.  Musculoskeletal: Normal range of motion. He exhibits edema.  Lymphadenopathy:    He has no cervical adenopathy.  Neurological: He is alert and oriented to person, place, and time. He has normal strength. No cranial nerve deficit or sensory deficit.  Skin: Skin is warm. He is diaphoretic.  Psychiatric:  Very anxious    Diagnostic Tests:  Minna Merritts, MD (Primary)    Procedures   Left Heart Cath and Coronary Angiography  Conclusion    Dist Cx lesion, 70 %stenosed.  Prox LAD lesion, 80 %stenosed.  Ost LM to LM lesion, 70 %stenosed.  Prox RCA lesion, 90 %stenosed.  Mid RCA lesion, 90 %stenosed.  Mid RCA to Dist RCA lesion, 30 %stenosed.  The left ventricular ejection fraction is 35-45% by visual estimate.  1st Diag lesion, 80 %stenosed.  Procedural  Details/Technique   Technical Details Cardiac Catheterization Procedure Note  Name: Brian Williams MRN: [id number] DOB: [birth year]  Procedure: Left Heart Cath, Selective Coronary Angiography, LV angiography  Indication: 55 yo male with known CAD, PCI to the LAD and distal RCA, Previously seen in [place] for his cardiac issues. History dates back to 2002 when he had distal RCA disease. Several catheterizations over the next several years for in-stent restenosis of the distal RCA. Later cath, stent placed to the LAD. Most recent catheterization appears to be April 2011 showing patent LAD and RCA stent. History of sustained VT, VT ablation in [place] at [facility] History of sleep apnea, on CPAP,ICD in place, recent ICD generator change in 2016 previously seen by seen by [facility] cardiology heart failure/transplant team, [name].  Presenting with increasing SOB, fatigue, chest pain concerning for unstable angina. He did not want stress testing   Procedural details: The right groin was prepped, draped, and anesthetized with 1% lidocaine. Using modified Seldinger technique, a 5 French sheath was introduced into the right femoral artery. Standard Judkins catheters (JL 5, JR 5 and pigtail catheter) were used for coronary angiography and left ventriculography. Catheter exchanges were performed over a guidewire. There were no immediate procedural complications. The patient was transferred to the post catheterization recovery area for further monitoring.  Moderate sedation: I was Face to Face with the patient during this time: (code: [phone])   Procedural Findings:  Coronary angiography:  Coronary dominance: Right  Left mainstem: Large vessel that bifurcates into the LAD and left circumflex, 60% eccentric distal left main disease  Left anterior descending (LAD): Large vessel that extends to the apical region, diagonal branch 1 of moderate size. LAD with 80% diffuse ISR of proximal  stent, also with 80% proximal D1 disease.  Left circumflex (LCx): Large vessel with OM branch 2, with 70% mid OM 2 disease.  Right coronary artery (RCA): Right dominant vessel  with PL and PDA, severe proximal and mid disease estimated at 90%, moderately calcified, patent distal stent with 30% ISR  Left ventriculography: Left ventricular systolic function is moderately depressed, LVEF is estimated at 35 to 40%, there is no significant mitral regurgitation , no significant aortic valve stenosis Akinesis of the inferior wall  Final Conclusions:  Severe proximal LAD disease (ISR) Severe proximal and mid RAC disease Severe OM2 disease  Severe proximal diagonal #1 disease Moderate to severe left main disease  Recommendations:  Will refer for CABG given left main disease, numerous severe lesions in LAD, diagonal and RCA. -will hold plavix in preparation Referral placed  [place] 05/10/2016, 9:49 AM   Estimated blood loss <50 mL. . During this procedure the patient was administered the following to achieve and maintain moderate conscious sedation: Versed 2 mg, Fentanyl 100 mcg, while the patient's heart rate, blood pressure, and oxygen saturation were continuously monitored. The period of conscious sedation was 40 minutes, of which I was present face-to-face 100% of this time.    Coronary Findings   Dominance: Co-dominant  Left Main  Ost LM to LM lesion, 70% stenosed.  Left Anterior Descending  Prox LAD lesion, 80% stenosed. The lesion was previously treated.  First Diagonal Branch  1st Diag lesion, 80% stenosed.  Left Circumflex  Dist Cx lesion, 70% stenosed.  Right Coronary Artery  Prox RCA lesion, 90% stenosed.  Mid RCA lesion, 90% stenosed.  Mid RCA to Dist RCA lesion, 30% stenosed. The lesion was previously treated.  Wall Motion              Left Heart   Left Ventricle The left ventricular ejection fraction is 35-45% by visual estimate.      Coronary Diagrams   Diagnostic Diagram       Impression:  I have personally reviewed and interpreted his recent cardiac cath. This 55 year old diabetic gentleman has 70% LM and severe 3 vessel coronary disease with moderate LV dysfunction and progressive anginal symptoms. He has a history of ischemic cardiomyopathy with an EF of 25% in the past but his LV function appears improved. I agree that CABG is the best treatment for him. His operative risk is increased due to his morbid obesity, poorly controlled DM, long history of heavy smoking, and reduced EF. I reviewed his cath with him and my treatment recommendations. I spent a long time answering all of his questions and trying a calm his anxiety about surgery. I discussed the operative procedure with the patient and his wife including alternatives, benefits and risks; including but not limited to bleeding, blood transfusion, infection, stroke, myocardial infarction, graft failure, heart block requiring a permanent pacemaker, organ dysfunction, and death.  Worthy Keeler understands and agrees to proceed. We will schedule surgery for 05/24/2016.  Plan:  CABG   Gaye Pollack, MD [facility] [phone]

## 2016-05-24 ENCOUNTER — Inpatient Hospital Stay (HOSPITAL_COMMUNITY): Payer: BLUE CROSS/BLUE SHIELD

## 2016-05-24 ENCOUNTER — Inpatient Hospital Stay (HOSPITAL_COMMUNITY)
Admission: RE | Admit: 2016-05-24 | Discharge: 2016-05-30 | DRG: 236 | Disposition: A | Discharge status: Home / Self Care | Payer: BLUE CROSS/BLUE SHIELD | Source: Ambulatory Visit | Attending: Surgery | Admitting: Surgery

## 2016-05-24 ENCOUNTER — Inpatient Hospital Stay (HOSPITAL_COMMUNITY)
Admission: RE | Disposition: A | Discharge status: Home / Self Care | Payer: Self-pay | Source: Ambulatory Visit | Attending: Surgery

## 2016-05-24 ENCOUNTER — Inpatient Hospital Stay (HOSPITAL_COMMUNITY): Payer: BLUE CROSS/BLUE SHIELD | Admitting: Anesthesiology

## 2016-05-24 ENCOUNTER — Encounter (HOSPITAL_COMMUNITY): Payer: Self-pay | Admitting: *Deleted

## 2016-05-24 DIAGNOSIS — I2510 Atherosclerotic heart disease of native coronary artery without angina pectoris: Principal | ICD-10-CM | POA: Diagnosis present

## 2016-05-24 DIAGNOSIS — Z95810 Presence of automatic (implantable) cardiac defibrillator: Secondary | ICD-10-CM | POA: Diagnosis not present

## 2016-05-24 DIAGNOSIS — I252 Old myocardial infarction: Secondary | ICD-10-CM | POA: Diagnosis not present

## 2016-05-24 DIAGNOSIS — Z87891 Personal history of nicotine dependence: Secondary | ICD-10-CM

## 2016-05-24 DIAGNOSIS — E118 Type 2 diabetes mellitus with unspecified complications: Secondary | ICD-10-CM

## 2016-05-24 DIAGNOSIS — Z951 Presence of aortocoronary bypass graft: Secondary | ICD-10-CM

## 2016-05-24 DIAGNOSIS — I5032 Chronic diastolic (congestive) heart failure: Secondary | ICD-10-CM | POA: Diagnosis present

## 2016-05-24 DIAGNOSIS — I493 Ventricular premature depolarization: Secondary | ICD-10-CM | POA: Diagnosis not present

## 2016-05-24 DIAGNOSIS — Z9111 Patient's noncompliance with dietary regimen: Secondary | ICD-10-CM

## 2016-05-24 DIAGNOSIS — IMO0002 Reserved for concepts with insufficient information to code with codable children: Secondary | ICD-10-CM

## 2016-05-24 DIAGNOSIS — F329 Major depressive disorder, single episode, unspecified: Secondary | ICD-10-CM | POA: Diagnosis present

## 2016-05-24 DIAGNOSIS — E1140 Type 2 diabetes mellitus with diabetic neuropathy, unspecified: Secondary | ICD-10-CM | POA: Diagnosis present

## 2016-05-24 DIAGNOSIS — Z794 Long term (current) use of insulin: Secondary | ICD-10-CM | POA: Diagnosis not present

## 2016-05-24 DIAGNOSIS — G4733 Obstructive sleep apnea (adult) (pediatric): Secondary | ICD-10-CM | POA: Diagnosis present

## 2016-05-24 DIAGNOSIS — I255 Ischemic cardiomyopathy: Secondary | ICD-10-CM | POA: Diagnosis present

## 2016-05-24 DIAGNOSIS — E785 Hyperlipidemia, unspecified: Secondary | ICD-10-CM | POA: Diagnosis present

## 2016-05-24 DIAGNOSIS — Z885 Allergy status to narcotic agent status: Secondary | ICD-10-CM

## 2016-05-24 DIAGNOSIS — F419 Anxiety disorder, unspecified: Secondary | ICD-10-CM | POA: Diagnosis present

## 2016-05-24 DIAGNOSIS — E1165 Type 2 diabetes mellitus with hyperglycemia: Secondary | ICD-10-CM | POA: Diagnosis present

## 2016-05-24 DIAGNOSIS — Z79899 Other long term (current) drug therapy: Secondary | ICD-10-CM | POA: Diagnosis not present

## 2016-05-24 DIAGNOSIS — Z7982 Long term (current) use of aspirin: Secondary | ICD-10-CM | POA: Diagnosis not present

## 2016-05-24 DIAGNOSIS — Z7902 Long term (current) use of antithrombotics/antiplatelets: Secondary | ICD-10-CM

## 2016-05-24 DIAGNOSIS — E8770 Fluid overload, unspecified: Secondary | ICD-10-CM | POA: Diagnosis not present

## 2016-05-24 DIAGNOSIS — Z9641 Presence of insulin pump (external) (internal): Secondary | ICD-10-CM | POA: Diagnosis present

## 2016-05-24 DIAGNOSIS — I110 Hypertensive heart disease with heart failure: Secondary | ICD-10-CM | POA: Diagnosis present

## 2016-05-24 DIAGNOSIS — Z6841 Body Mass Index (BMI) 40.0 and over, adult: Secondary | ICD-10-CM

## 2016-05-24 DIAGNOSIS — D62 Acute posthemorrhagic anemia: Secondary | ICD-10-CM | POA: Diagnosis not present

## 2016-05-24 DIAGNOSIS — Z886 Allergy status to analgesic agent status: Secondary | ICD-10-CM

## 2016-05-24 DIAGNOSIS — J9811 Atelectasis: Secondary | ICD-10-CM

## 2016-05-24 HISTORY — PX: CORONARY ARTERY BYPASS GRAFT: SHX141

## 2016-05-24 HISTORY — PX: TEE WITHOUT CARDIOVERSION: SHX5443

## 2016-05-24 LAB — GLUCOSE, CAPILLARY
Glucose-Capillary: 89 mg/dL (ref 65–99)
Glucose-Capillary: 203 mg/dL — ABNORMAL HIGH (ref 65–99)
Glucose-Capillary: 194 mg/dL — ABNORMAL HIGH (ref 65–99)
Glucose-Capillary: 166 mg/dL — ABNORMAL HIGH (ref 65–99)
Glucose-Capillary: 155 mg/dL — ABNORMAL HIGH (ref 65–99)
Glucose-Capillary: 158 mg/dL — ABNORMAL HIGH (ref 65–99)
Glucose-Capillary: 150 mg/dL — ABNORMAL HIGH (ref 65–99)
Glucose-Capillary: 163 mg/dL — ABNORMAL HIGH (ref 65–99)
Glucose-Capillary: 148 mg/dL — ABNORMAL HIGH (ref 65–99)
Glucose-Capillary: 149 mg/dL — ABNORMAL HIGH (ref 65–99)

## 2016-05-24 LAB — POCT I-STAT 3, ART BLOOD GAS (G3+)
Acid-base deficit: 2 mmol/L (ref 0.0–2.0)
Acid-base deficit: 8 mmol/L — ABNORMAL HIGH (ref 0.0–2.0)
Acid-base deficit: 3 mmol/L — ABNORMAL HIGH (ref 0.0–2.0)
Acid-base deficit: 3 mmol/L — ABNORMAL HIGH (ref 0.0–2.0)
Acid-base deficit: 4 mmol/L — ABNORMAL HIGH (ref 0.0–2.0)
Bicarbonate: 24.4 mmol/L (ref 20.0–28.0)
Bicarbonate: 19.6 mmol/L — ABNORMAL LOW (ref 20.0–28.0)
Bicarbonate: 22.8 mmol/L (ref 20.0–28.0)
Bicarbonate: 22.7 mmol/L (ref 20.0–28.0)
Bicarbonate: 22.9 mmol/L (ref 20.0–28.0)
O2 Saturation: 100 %
O2 Saturation: 95 %
O2 Saturation: 98 %
O2 Saturation: 97 %
O2 Saturation: 92 %
Patient temperature: 37.1
Patient temperature: 37.2
Patient temperature: 37.6
Patient temperature: 37.8
TCO2: 26 mmol/L (ref 0–100)
TCO2: 21 mmol/L (ref 0–100)
TCO2: 24 mmol/L (ref 0–100)
TCO2: 24 mmol/L (ref 0–100)
TCO2: 24 mmol/L (ref 0–100)
pCO2 arterial: 48.8 mmHg — ABNORMAL HIGH (ref 32.0–48.0)
pCO2 arterial: 46 mmHg (ref 32.0–48.0)
pCO2 arterial: 41 mmHg (ref 32.0–48.0)
pCO2 arterial: 42 mmHg (ref 32.0–48.0)
pCO2 arterial: 48.6 mmHg — ABNORMAL HIGH (ref 32.0–48.0)
pH, Arterial: 7.307 — ABNORMAL LOW (ref 7.350–7.450)
pH, Arterial: 7.237 — ABNORMAL LOW (ref 7.350–7.450)
pH, Arterial: 7.354 (ref 7.350–7.450)
pH, Arterial: 7.344 — ABNORMAL LOW (ref 7.350–7.450)
pH, Arterial: 7.284 — ABNORMAL LOW (ref 7.350–7.450)
pO2, Arterial: 219 mmHg — ABNORMAL HIGH (ref 83.0–108.0)
pO2, Arterial: 87 mmHg (ref 83.0–108.0)
pO2, Arterial: 107 mmHg (ref 83.0–108.0)
pO2, Arterial: 99 mmHg (ref 83.0–108.0)
pO2, Arterial: 75 mmHg — ABNORMAL LOW (ref 83.0–108.0)

## 2016-05-24 LAB — POCT I-STAT 7, (LYTES, BLD GAS, ICA,H+H)
Acid-base deficit: 5 mmol/L — ABNORMAL HIGH (ref 0.0–2.0)
Bicarbonate: 22.1 mmol/L (ref 20.0–28.0)
Calcium, Ion: 1.3 mmol/L (ref 1.15–1.40)
HCT: 31 % — ABNORMAL LOW (ref 39.0–52.0)
Hemoglobin: 10.5 g/dL — ABNORMAL LOW (ref 13.0–17.0)
O2 Saturation: 100 %
Potassium: 3.8 mmol/L (ref 3.5–5.1)
Sodium: 139 mmol/L (ref 135–145)
TCO2: 23 mmol/L (ref 0–100)
pCO2 arterial: 46.7 mmHg (ref 32.0–48.0)
pH, Arterial: 7.283 — ABNORMAL LOW (ref 7.350–7.450)
pO2, Arterial: 222 mmHg — ABNORMAL HIGH (ref 83.0–108.0)

## 2016-05-24 LAB — POCT I-STAT, CHEM 8
BUN: 13 mg/dL (ref 6–20)
BUN: 12 mg/dL (ref 6–20)
BUN: 12 mg/dL (ref 6–20)
BUN: 12 mg/dL (ref 6–20)
BUN: 14 mg/dL (ref 6–20)
BUN: 9 mg/dL (ref 6–20)
Calcium, Ion: 1.33 mmol/L (ref 1.15–1.40)
Calcium, Ion: 1.25 mmol/L (ref 1.15–1.40)
Calcium, Ion: 1.09 mmol/L — ABNORMAL LOW (ref 1.15–1.40)
Calcium, Ion: 1.13 mmol/L — ABNORMAL LOW (ref 1.15–1.40)
Calcium, Ion: 1.22 mmol/L (ref 1.15–1.40)
Calcium, Ion: 1.17 mmol/L (ref 1.15–1.40)
Chloride: 105 mmol/L (ref 101–111)
Chloride: 106 mmol/L (ref 101–111)
Chloride: 101 mmol/L (ref 101–111)
Chloride: 104 mmol/L (ref 101–111)
Chloride: 103 mmol/L (ref 101–111)
Chloride: 106 mmol/L (ref 101–111)
Creatinine, Ser: 0.6 mg/dL — ABNORMAL LOW (ref 0.61–1.24)
Creatinine, Ser: 0.5 mg/dL — ABNORMAL LOW (ref 0.61–1.24)
Creatinine, Ser: 0.4 mg/dL — ABNORMAL LOW (ref 0.61–1.24)
Creatinine, Ser: 0.4 mg/dL — ABNORMAL LOW (ref 0.61–1.24)
Creatinine, Ser: 0.7 mg/dL (ref 0.61–1.24)
Creatinine, Ser: 0.6 mg/dL — ABNORMAL LOW (ref 0.61–1.24)
Glucose, Bld: 84 mg/dL (ref 65–99)
Glucose, Bld: 124 mg/dL — ABNORMAL HIGH (ref 65–99)
Glucose, Bld: 123 mg/dL — ABNORMAL HIGH (ref 65–99)
Glucose, Bld: 165 mg/dL — ABNORMAL HIGH (ref 65–99)
Glucose, Bld: 257 mg/dL — ABNORMAL HIGH (ref 65–99)
Glucose, Bld: 145 mg/dL — ABNORMAL HIGH (ref 65–99)
HCT: 36 % — ABNORMAL LOW (ref 39.0–52.0)
HCT: 31 % — ABNORMAL LOW (ref 39.0–52.0)
HCT: 29 % — ABNORMAL LOW (ref 39.0–52.0)
HCT: 26 % — ABNORMAL LOW (ref 39.0–52.0)
HCT: 31 % — ABNORMAL LOW (ref 39.0–52.0)
HCT: 34 % — ABNORMAL LOW (ref 39.0–52.0)
Hemoglobin: 12.2 g/dL — ABNORMAL LOW (ref 13.0–17.0)
Hemoglobin: 10.5 g/dL — ABNORMAL LOW (ref 13.0–17.0)
Hemoglobin: 9.9 g/dL — ABNORMAL LOW (ref 13.0–17.0)
Hemoglobin: 8.8 g/dL — ABNORMAL LOW (ref 13.0–17.0)
Hemoglobin: 10.5 g/dL — ABNORMAL LOW (ref 13.0–17.0)
Hemoglobin: 11.6 g/dL — ABNORMAL LOW (ref 13.0–17.0)
Potassium: 4.1 mmol/L (ref 3.5–5.1)
Potassium: 4.2 mmol/L (ref 3.5–5.1)
Potassium: 4.2 mmol/L (ref 3.5–5.1)
Potassium: 5.3 mmol/L — ABNORMAL HIGH (ref 3.5–5.1)
Potassium: 5.4 mmol/L — ABNORMAL HIGH (ref 3.5–5.1)
Potassium: 4.1 mmol/L (ref 3.5–5.1)
Sodium: 140 mmol/L (ref 135–145)
Sodium: 139 mmol/L (ref 135–145)
Sodium: 136 mmol/L (ref 135–145)
Sodium: 134 mmol/L — ABNORMAL LOW (ref 135–145)
Sodium: 137 mmol/L (ref 135–145)
Sodium: 141 mmol/L (ref 135–145)
TCO2: 24 mmol/L (ref 0–100)
TCO2: 23 mmol/L (ref 0–100)
TCO2: 22 mmol/L (ref 0–100)
TCO2: 22 mmol/L (ref 0–100)
TCO2: 23 mmol/L (ref 0–100)
TCO2: 23 mmol/L (ref 0–100)

## 2016-05-24 LAB — CBC
HCT: 37.2 % — ABNORMAL LOW (ref 39.0–52.0)
HCT: 32.4 % — ABNORMAL LOW (ref 39.0–52.0)
Hemoglobin: 12.2 g/dL — ABNORMAL LOW (ref 13.0–17.0)
Hemoglobin: 11 g/dL — ABNORMAL LOW (ref 13.0–17.0)
MCH: 31 pg (ref 26.0–34.0)
MCH: 31.3 pg (ref 26.0–34.0)
MCHC: 32.8 g/dL (ref 30.0–36.0)
MCHC: 34 g/dL (ref 30.0–36.0)
MCV: 94.4 fL (ref 78.0–100.0)
MCV: 92 fL (ref 78.0–100.0)
Platelets: 166 10*3/uL (ref 150–400)
Platelets: 185 10*3/uL (ref 150–400)
RBC: 3.94 MIL/uL — ABNORMAL LOW (ref 4.22–5.81)
RBC: 3.52 MIL/uL — ABNORMAL LOW (ref 4.22–5.81)
RDW: 14.4 % (ref 11.5–15.5)
RDW: 14.4 % (ref 11.5–15.5)
WBC: 13.3 10*3/uL — ABNORMAL HIGH (ref 4.0–10.5)
WBC: 11.7 10*3/uL — ABNORMAL HIGH (ref 4.0–10.5)

## 2016-05-24 LAB — POCT I-STAT 4, (NA,K, GLUC, HGB,HCT)
Glucose, Bld: 215 mg/dL — ABNORMAL HIGH (ref 65–99)
HCT: 37 % — ABNORMAL LOW (ref 39.0–52.0)
Hemoglobin: 12.6 g/dL — ABNORMAL LOW (ref 13.0–17.0)
Potassium: 5.2 mmol/L — ABNORMAL HIGH (ref 3.5–5.1)
Sodium: 138 mmol/L (ref 135–145)

## 2016-05-24 LAB — PROTIME-INR
INR: 1.25
Prothrombin Time: 15.8 seconds — ABNORMAL HIGH (ref 11.4–15.2)

## 2016-05-24 LAB — PLATELET COUNT: Platelets: 135 10*3/uL — ABNORMAL LOW (ref 150–400)

## 2016-05-24 LAB — CREATININE, SERUM
Creatinine, Ser: 0.64 mg/dL (ref 0.61–1.24)
GFR calc Af Amer: >60 mL/min (ref >60)
GFR calc non Af Amer: >60 mL/min (ref >60)

## 2016-05-24 LAB — APTT: aPTT: 31 seconds (ref 24–36)

## 2016-05-24 LAB — MAGNESIUM: Magnesium: 2.8 mg/dL — ABNORMAL HIGH (ref 1.7–2.4)

## 2016-05-24 LAB — HEMOGLOBIN AND HEMATOCRIT, BLOOD
HCT: 25.5 % — ABNORMAL LOW (ref 39.0–52.0)
Hemoglobin: 8.4 g/dL — ABNORMAL LOW (ref 13.0–17.0)

## 2016-05-24 SURGERY — CORONARY ARTERY BYPASS GRAFTING (CABG)
Anesthesia: General | Site: Chest

## 2016-05-24 MED ORDER — HEMOSTATIC AGENTS (NO CHARGE) OPTIME
TOPICAL | Status: DC | PRN
Start: 2016-05-24 — End: 2016-05-24
  Administered 2016-05-24: 1 via TOPICAL

## 2016-05-24 MED ORDER — SUCCINYLCHOLINE CHLORIDE 200 MG/10ML IV SOSY
PREFILLED_SYRINGE | INTRAVENOUS | Status: AC
  Filled 2016-05-24: qty 10

## 2016-05-24 MED ORDER — FAMOTIDINE IN NACL 20-0.9 MG/50ML-% IV SOLN
20.0000 mg | Freq: Two times a day (BID) | INTRAVENOUS | Status: AC
  Administered 2016-05-24: 20 mg via INTRAVENOUS

## 2016-05-24 MED ORDER — LACTATED RINGERS IV SOLN
INTRAVENOUS | Status: DC | PRN
Start: 2016-05-24 — End: 2016-05-24
  Administered 2016-05-24 (×2): via INTRAVENOUS

## 2016-05-24 MED ORDER — PHENYLEPHRINE HCL 10 MG/ML IJ SOLN
INTRAVENOUS | Status: DC | PRN
  Administered 2016-05-24: 15 ug/min via INTRAVENOUS

## 2016-05-24 MED ORDER — MAGNESIUM SULFATE 4 GM/100ML IV SOLN
4.0000 g | Freq: Once | INTRAVENOUS | Status: AC
  Administered 2016-05-24: 4 g via INTRAVENOUS
  Filled 2016-05-24: qty 100

## 2016-05-24 MED ORDER — MIDAZOLAM HCL 10 MG/2ML IJ SOLN
INTRAMUSCULAR | Status: AC
Start: 2016-05-24 — End: 2016-05-24
  Filled 2016-05-24: qty 2

## 2016-05-24 MED ORDER — ONDANSETRON HCL 4 MG/2ML IJ SOLN
4.0000 mg | Freq: Four times a day (QID) | INTRAMUSCULAR | Status: DC | PRN
  Administered 2016-05-24: 4 mg via INTRAVENOUS
  Filled 2016-05-24: qty 2

## 2016-05-24 MED ORDER — INSULIN REGULAR BOLUS VIA INFUSION
0.0000 [IU] | Freq: Three times a day (TID) | INTRAVENOUS | Status: DC
  Filled 2016-05-24: qty 10

## 2016-05-24 MED ORDER — PROPOFOL 10 MG/ML IV BOLUS
INTRAVENOUS | Status: AC
  Filled 2016-05-24: qty 20

## 2016-05-24 MED ORDER — FENTANYL CITRATE (PF) 100 MCG/2ML IJ SOLN
100.0000 ug | INTRAMUSCULAR | Status: DC | PRN
  Administered 2016-05-24 – 2016-05-25 (×3): 100 ug via INTRAVENOUS
  Administered 2016-05-25: 50 ug via INTRAVENOUS
  Administered 2016-05-25 (×2): 100 ug via INTRAVENOUS
  Administered 2016-05-25 (×2): 50 ug via INTRAVENOUS
  Administered 2016-05-25 – 2016-05-26 (×4): 100 ug via INTRAVENOUS
  Filled 2016-05-24 (×12): qty 2

## 2016-05-24 MED ORDER — LACTATED RINGERS IV SOLN: INTRAVENOUS | Status: DC

## 2016-05-24 MED ORDER — SUCCINYLCHOLINE CHLORIDE 20 MG/ML IJ SOLN
INTRAMUSCULAR | Status: DC | PRN
Start: 2016-05-24 — End: 2016-05-24
  Administered 2016-05-24: 60 mg via INTRAVENOUS
  Administered 2016-05-24: 140 mg via INTRAVENOUS

## 2016-05-24 MED ORDER — DOCUSATE SODIUM 100 MG PO CAPS
200.0000 mg | ORAL_CAPSULE | Freq: Every day | ORAL | Status: DC
  Administered 2016-05-25 – 2016-05-29 (×5): 200 mg via ORAL
  Filled 2016-05-24 (×6): qty 2

## 2016-05-24 MED ORDER — ROCURONIUM BROMIDE 10 MG/ML (PF) SYRINGE
PREFILLED_SYRINGE | INTRAVENOUS | Status: AC
  Filled 2016-05-24: qty 40

## 2016-05-24 MED ORDER — SODIUM CHLORIDE 0.9 % IV SOLN
INTRAVENOUS | Status: DC
  Administered 2016-05-24: 15:00:00 via INTRAVENOUS

## 2016-05-24 MED ORDER — MIDAZOLAM HCL 5 MG/5ML IJ SOLN
INTRAMUSCULAR | Status: DC | PRN
  Administered 2016-05-24: 2 mg via INTRAVENOUS
  Administered 2016-05-24: 1 mg via INTRAVENOUS
  Administered 2016-05-24: 2 mg via INTRAVENOUS
  Administered 2016-05-24: 3 mg via INTRAVENOUS
  Administered 2016-05-24: 2 mg via INTRAVENOUS

## 2016-05-24 MED ORDER — ACETAMINOPHEN 160 MG/5ML PO SOLN: 650.0000 mg | Freq: Once | ORAL | Status: AC

## 2016-05-24 MED ORDER — OXYCODONE HCL 5 MG PO TABS
5.0000 mg | ORAL_TABLET | ORAL | Status: DC | PRN
  Administered 2016-05-24 – 2016-05-25 (×3): 10 mg via ORAL
  Filled 2016-05-24 (×3): qty 2

## 2016-05-24 MED ORDER — THROMBIN 5000 UNITS EX SOLR
OROMUCOSAL | Status: DC | PRN
  Administered 2016-05-24 (×3): 4 mL via TOPICAL

## 2016-05-24 MED ORDER — METOPROLOL TARTRATE 5 MG/5ML IV SOLN: 2.5000 mg | INTRAVENOUS | Status: DC | PRN

## 2016-05-24 MED ORDER — DEXMEDETOMIDINE HCL IN NACL 200 MCG/50ML IV SOLN
0.4000 ug/kg/h | INTRAVENOUS | Status: DC
  Filled 2016-05-24: qty 50

## 2016-05-24 MED ORDER — PROTAMINE SULFATE 10 MG/ML IV SOLN
INTRAVENOUS | Status: DC | PRN
  Administered 2016-05-24: 400 mg via INTRAVENOUS

## 2016-05-24 MED ORDER — VANCOMYCIN HCL IN DEXTROSE 1-5 GM/200ML-% IV SOLN
1000.0000 mg | Freq: Once | INTRAVENOUS | Status: AC
  Administered 2016-05-24: 1000 mg via INTRAVENOUS
  Filled 2016-05-24: qty 200

## 2016-05-24 MED ORDER — SODIUM CHLORIDE 0.45 % IV SOLN
INTRAVENOUS | Status: DC | PRN
  Administered 2016-05-24: 15:00:00 via INTRAVENOUS

## 2016-05-24 MED ORDER — PROPOFOL 10 MG/ML IV BOLUS
INTRAVENOUS | Status: DC | PRN
  Administered 2016-05-24: 130 mg via INTRAVENOUS

## 2016-05-24 MED ORDER — DOPAMINE-DEXTROSE 3.2-5 MG/ML-% IV SOLN: 0.0000 ug/kg/min | INTRAVENOUS | Status: DC

## 2016-05-24 MED ORDER — SODIUM CHLORIDE 0.9 % IV SOLN
INTRAVENOUS | Status: DC
  Filled 2016-05-24: qty 2.5

## 2016-05-24 MED ORDER — DEXMEDETOMIDINE HCL IN NACL 200 MCG/50ML IV SOLN
0.0000 ug/kg/h | INTRAVENOUS | Status: DC
  Administered 2016-05-24: 0.5 ug/kg/h via INTRAVENOUS
  Administered 2016-05-24: 0.7 ug/kg/h via INTRAVENOUS
  Filled 2016-05-24 (×2): qty 50

## 2016-05-24 MED ORDER — ETOMIDATE 2 MG/ML IV SOLN
INTRAVENOUS | Status: DC | PRN
  Administered 2016-05-24: 14 mg via INTRAVENOUS

## 2016-05-24 MED ORDER — LIDOCAINE 2% (20 MG/ML) 5 ML SYRINGE
INTRAMUSCULAR | Status: AC
  Filled 2016-05-24: qty 5

## 2016-05-24 MED ORDER — MIDAZOLAM HCL 2 MG/2ML IJ SOLN
2.0000 mg | INTRAMUSCULAR | Status: DC | PRN
  Filled 2016-05-24: qty 2

## 2016-05-24 MED ORDER — SERTRALINE HCL 100 MG PO TABS
100.0000 mg | ORAL_TABLET | Freq: Every day | ORAL | Status: DC
Start: 2016-05-25 — End: 2016-05-30
  Administered 2016-05-25 – 2016-05-30 (×6): 100 mg via ORAL
  Filled 2016-05-24 (×6): qty 1

## 2016-05-24 MED ORDER — EZETIMIBE 10 MG PO TABS
10.0000 mg | ORAL_TABLET | Freq: Every day | ORAL | Status: DC
  Administered 2016-05-25 – 2016-05-27 (×3): 10 mg via ORAL
  Filled 2016-05-24 (×3): qty 1

## 2016-05-24 MED ORDER — ORAL CARE MOUTH RINSE
15.0000 mL | Freq: Two times a day (BID) | OROMUCOSAL | Status: DC
  Administered 2016-05-25 – 2016-05-29 (×8): 15 mL via OROMUCOSAL

## 2016-05-24 MED ORDER — LACTATED RINGERS IV SOLN
INTRAVENOUS | Status: DC
  Administered 2016-05-24: 16:00:00 via INTRAVENOUS

## 2016-05-24 MED ORDER — SODIUM CHLORIDE 0.9 % IV SOLN
0.5000 g/h | Freq: Once | INTRAVENOUS | Status: DC
  Filled 2016-05-24: qty 20

## 2016-05-24 MED ORDER — LACTATED RINGERS IV SOLN
INTRAVENOUS | Status: DC | PRN
  Administered 2016-05-24: 07:00:00 via INTRAVENOUS

## 2016-05-24 MED ORDER — SODIUM CHLORIDE 0.9 % IV SOLN: 250.0000 mL | INTRAVENOUS | Status: DC

## 2016-05-24 MED ORDER — METOPROLOL TARTRATE 12.5 MG HALF TABLET
12.5000 mg | ORAL_TABLET | Freq: Two times a day (BID) | ORAL | Status: DC
Start: 2016-05-24 — End: 2016-05-25

## 2016-05-24 MED ORDER — FENTANYL CITRATE (PF) 250 MCG/5ML IJ SOLN
INTRAMUSCULAR | Status: DC | PRN
  Administered 2016-05-24: 50 ug via INTRAVENOUS
  Administered 2016-05-24: 100 ug via INTRAVENOUS
  Administered 2016-05-24: 150 ug via INTRAVENOUS
  Administered 2016-05-24: 100 ug via INTRAVENOUS
  Administered 2016-05-24: 50 ug via INTRAVENOUS
  Administered 2016-05-24: 150 ug via INTRAVENOUS
  Administered 2016-05-24 (×3): 100 ug via INTRAVENOUS
  Administered 2016-05-24 (×2): 50 ug via INTRAVENOUS
  Administered 2016-05-24: 100 ug via INTRAVENOUS
  Administered 2016-05-24: 50 ug via INTRAVENOUS

## 2016-05-24 MED ORDER — LIDOCAINE 2% (20 MG/ML) 5 ML SYRINGE
INTRAMUSCULAR | Status: DC | PRN
  Administered 2016-05-24: 60 mg via INTRAVENOUS

## 2016-05-24 MED ORDER — THROMBIN 5000 UNITS EX SOLR
CUTANEOUS | Status: DC | PRN
  Administered 2016-05-24: 5000 [IU] via TOPICAL

## 2016-05-24 MED ORDER — LACTATED RINGERS IV SOLN: 500.0000 mL | Freq: Once | INTRAVENOUS | Status: DC | PRN

## 2016-05-24 MED ORDER — BISACODYL 5 MG PO TBEC
10.0000 mg | DELAYED_RELEASE_TABLET | Freq: Every day | ORAL | Status: DC
  Administered 2016-05-25 – 2016-05-28 (×4): 10 mg via ORAL
  Filled 2016-05-24 (×5): qty 2

## 2016-05-24 MED ORDER — HEPARIN SODIUM (PORCINE) 1000 UNIT/ML IJ SOLN
INTRAMUSCULAR | Status: DC | PRN
  Administered 2016-05-24: 50000 [IU] via INTRAVENOUS

## 2016-05-24 MED ORDER — DOPAMINE-DEXTROSE 3.2-5 MG/ML-% IV SOLN
INTRAVENOUS | Status: DC | PRN
  Administered 2016-05-24: 3 ug/kg/min via INTRAVENOUS

## 2016-05-24 MED ORDER — THROMBIN 5000 UNITS EX SOLR
CUTANEOUS | Status: AC
  Filled 2016-05-24: qty 5000

## 2016-05-24 MED ORDER — ALBUMIN HUMAN 5 % IV SOLN
250.0000 mL | INTRAVENOUS | Status: AC | PRN
  Administered 2016-05-24: 250 mL via INTRAVENOUS

## 2016-05-24 MED ORDER — PHENYLEPHRINE HCL 10 MG/ML IJ SOLN
0.0000 ug/min | INTRAMUSCULAR | Status: DC
  Filled 2016-05-24: qty 2

## 2016-05-24 MED ORDER — TRAMADOL HCL 50 MG PO TABS
50.0000 mg | ORAL_TABLET | ORAL | Status: DC | PRN
  Filled 2016-05-24: qty 1

## 2016-05-24 MED ORDER — SODIUM CHLORIDE 0.9% FLUSH
3.0000 mL | Freq: Two times a day (BID) | INTRAVENOUS | Status: DC
  Administered 2016-05-25 – 2016-05-29 (×9): 3 mL via INTRAVENOUS

## 2016-05-24 MED ORDER — SODIUM BICARBONATE 8.4 % IV SOLN
50.0000 meq | Freq: Once | INTRAVENOUS | Status: AC
Start: 2016-05-24 — End: 2016-05-24
  Administered 2016-05-24: 50 meq via INTRAVENOUS

## 2016-05-24 MED ORDER — ROSUVASTATIN CALCIUM 10 MG PO TABS
40.0000 mg | ORAL_TABLET | Freq: Every day | ORAL | Status: DC
  Administered 2016-05-25 – 2016-05-27 (×3): 40 mg via ORAL
  Filled 2016-05-24: qty 2
  Filled 2016-05-24: qty 4
  Filled 2016-05-24 (×2): qty 1
  Filled 2016-05-24: qty 2

## 2016-05-24 MED ORDER — 0.9 % SODIUM CHLORIDE (POUR BTL) OPTIME
TOPICAL | Status: DC | PRN
Start: 2016-05-24 — End: 2016-05-24
  Administered 2016-05-24: 6000 mL

## 2016-05-24 MED ORDER — ACETAMINOPHEN 160 MG/5ML PO SOLN: 1000.0000 mg | Freq: Four times a day (QID) | ORAL | Status: DC

## 2016-05-24 MED ORDER — ASPIRIN 81 MG PO CHEW
324.0000 mg | CHEWABLE_TABLET | Freq: Every day | ORAL | Status: DC
  Administered 2016-05-25: 324 mg
  Filled 2016-05-24: qty 4

## 2016-05-24 MED ORDER — CEFUROXIME SODIUM 1.5 G IJ SOLR
1.5000 g | Freq: Two times a day (BID) | INTRAMUSCULAR | Status: AC
  Administered 2016-05-24 – 2016-05-26 (×4): 1.5 g via INTRAVENOUS
  Filled 2016-05-24 (×4): qty 1.5

## 2016-05-24 MED ORDER — PANTOPRAZOLE SODIUM 40 MG PO TBEC
40.0000 mg | DELAYED_RELEASE_TABLET | Freq: Every day | ORAL | Status: DC
  Administered 2016-05-26 – 2016-05-30 (×5): 40 mg via ORAL
  Filled 2016-05-24 (×5): qty 1

## 2016-05-24 MED ORDER — SODIUM BICARBONATE 8.4 % IV SOLN
50.0000 meq | Freq: Once | INTRAVENOUS | Status: AC
  Administered 2016-05-24: 50 meq via INTRAVENOUS

## 2016-05-24 MED ORDER — ASPIRIN EC 325 MG PO TBEC
325.0000 mg | DELAYED_RELEASE_TABLET | Freq: Every day | ORAL | Status: DC
  Administered 2016-05-26 – 2016-05-30 (×5): 325 mg via ORAL
  Filled 2016-05-24 (×5): qty 1

## 2016-05-24 MED ORDER — SODIUM BICARBONATE 8.4 % IV SOLN
50.0000 meq | Freq: Once | INTRAVENOUS | Status: AC
  Administered 2016-05-24: 50 meq via INTRAVENOUS
  Filled 2016-05-24: qty 50

## 2016-05-24 MED ORDER — BISACODYL 10 MG RE SUPP: 10.0000 mg | Freq: Every day | RECTAL | Status: DC

## 2016-05-24 MED ORDER — MORPHINE SULFATE (PF) 2 MG/ML IV SOLN
2.0000 mg | INTRAVENOUS | Status: DC | PRN
  Filled 2016-05-24: qty 2

## 2016-05-24 MED ORDER — ACETAMINOPHEN 500 MG PO TABS
1000.0000 mg | ORAL_TABLET | Freq: Four times a day (QID) | ORAL | Status: AC
  Administered 2016-05-24 – 2016-05-29 (×19): 1000 mg via ORAL
  Filled 2016-05-24 (×18): qty 2

## 2016-05-24 MED ORDER — ROCURONIUM BROMIDE 100 MG/10ML IV SOLN
INTRAVENOUS | Status: DC | PRN
  Administered 2016-05-24: 50 mg via INTRAVENOUS
  Administered 2016-05-24: 60 mg via INTRAVENOUS
  Administered 2016-05-24: 30 mg via INTRAVENOUS
  Administered 2016-05-24: 20 mg via INTRAVENOUS
  Administered 2016-05-24 (×2): 50 mg via INTRAVENOUS
  Administered 2016-05-24: 60 mg via INTRAVENOUS
  Administered 2016-05-24: 50 mg via INTRAVENOUS
  Administered 2016-05-24: 30 mg via INTRAVENOUS

## 2016-05-24 MED ORDER — SODIUM CHLORIDE 0.9% FLUSH
3.0000 mL | INTRAVENOUS | Status: DC | PRN
  Administered 2016-05-25: 3 mL via INTRAVENOUS
  Filled 2016-05-24: qty 3

## 2016-05-24 MED ORDER — POTASSIUM CHLORIDE 10 MEQ/50ML IV SOLN: 10.0000 meq | INTRAVENOUS | Status: AC

## 2016-05-24 MED ORDER — NITROGLYCERIN IN D5W 200-5 MCG/ML-% IV SOLN: 0.0000 ug/min | INTRAVENOUS | Status: DC

## 2016-05-24 MED ORDER — ACETAMINOPHEN 650 MG RE SUPP
650.0000 mg | Freq: Once | RECTAL | Status: AC
  Administered 2016-05-24: 650 mg via RECTAL

## 2016-05-24 MED ORDER — CHLORHEXIDINE GLUCONATE 0.12 % MT SOLN
15.0000 mL | OROMUCOSAL | Status: AC
  Administered 2016-05-24: 15 mL via OROMUCOSAL

## 2016-05-24 MED ORDER — METOPROLOL TARTRATE 25 MG/10 ML ORAL SUSPENSION: 12.5000 mg | Freq: Two times a day (BID) | ORAL | Status: DC

## 2016-05-24 MED ORDER — GEMFIBROZIL 600 MG PO TABS
600.0000 mg | ORAL_TABLET | Freq: Every day | ORAL | Status: DC
Start: 2016-05-25 — End: 2016-05-28
  Administered 2016-05-25 – 2016-05-27 (×3): 600 mg via ORAL
  Filled 2016-05-24 (×4): qty 1

## 2016-05-24 MED ORDER — ALPRAZOLAM 0.5 MG PO TABS
1.0000 mg | ORAL_TABLET | Freq: Three times a day (TID) | ORAL | Status: DC | PRN
  Administered 2016-05-27: 1 mg via ORAL
  Filled 2016-05-24 (×2): qty 2

## 2016-05-24 MED ORDER — GELATIN ABSORBABLE MT POWD: OROMUCOSAL | Status: DC | PRN

## 2016-05-24 MED ORDER — FENTANYL CITRATE (PF) 250 MCG/5ML IJ SOLN
INTRAMUSCULAR | Status: AC
  Filled 2016-05-24: qty 25

## 2016-05-24 SURGICAL SUPPLY — 105 items
BAG DECANTER FOR FLEXI CONT (MISCELLANEOUS) ×3 IMPLANT
BANDAGE ACE 4X5 VEL STRL LF (GAUZE/BANDAGES/DRESSINGS) ×3 IMPLANT
BANDAGE ACE 6X5 VEL STRL LF (GAUZE/BANDAGES/DRESSINGS) ×3 IMPLANT
BASKET HEART (ORDER IN 25'S) (MISCELLANEOUS) ×1
BASKET HEART (ORDER IN 25S) (MISCELLANEOUS) ×2 IMPLANT
BLADE STERNUM SYSTEM 6 (BLADE) ×3 IMPLANT
BLADE SURG 11 STRL SS (BLADE) ×3 IMPLANT
BLADE SURG ROTATE 9660 (MISCELLANEOUS) ×3 IMPLANT
BNDG GAUZE ELAST 4 BULKY (GAUZE/BANDAGES/DRESSINGS) ×3 IMPLANT
CABLE PACING FASLOC BLUE (MISCELLANEOUS) ×3 IMPLANT
CANISTER SUCTION 2500CC (MISCELLANEOUS) ×3 IMPLANT
CANNULA ARTERIAL NVNT 3/8 22FR (MISCELLANEOUS) ×3 IMPLANT
CATH ROBINSON RED A/P 18FR (CATHETERS) ×6 IMPLANT
CATH THORACIC 28FR (CATHETERS) ×3 IMPLANT
CATH THORACIC 36FR (CATHETERS) ×3 IMPLANT
CATH THORACIC 36FR RT ANG (CATHETERS) ×3 IMPLANT
CLIP TI MEDIUM 24 (CLIP) IMPLANT
CLIP TI WIDE RED SMALL 24 (CLIP) ×6 IMPLANT
CRADLE DONUT ADULT HEAD (MISCELLANEOUS) ×3 IMPLANT
DERMABOND ADVANCED (GAUZE/BANDAGES/DRESSINGS) ×2
DERMABOND ADVANCED .7 DNX12 (GAUZE/BANDAGES/DRESSINGS) ×4 IMPLANT
DRAPE CARDIOVASCULAR INCISE (DRAPES) ×1
DRAPE SLUSH/WARMER DISC (DRAPES) ×3 IMPLANT
DRAPE SRG 135X102X78XABS (DRAPES) ×2 IMPLANT
DRSG COVADERM 4X14 (GAUZE/BANDAGES/DRESSINGS) ×3 IMPLANT
ELECT CAUTERY BLADE 6.4 (BLADE) ×6 IMPLANT
ELECT REM PT RETURN 9FT ADLT (ELECTROSURGICAL) ×6
ELECTRODE REM PT RTRN 9FT ADLT (ELECTROSURGICAL) ×4 IMPLANT
FELT TEFLON 1X6 (MISCELLANEOUS) ×6 IMPLANT
GAUZE SPONGE 4X4 12PLY STRL (GAUZE/BANDAGES/DRESSINGS) ×3 IMPLANT
GLOVE BIO SURGEON STRL SZ 6 (GLOVE) IMPLANT
GLOVE BIO SURGEON STRL SZ 6.5 (GLOVE) ×9 IMPLANT
GLOVE BIO SURGEON STRL SZ7 (GLOVE) IMPLANT
GLOVE BIO SURGEON STRL SZ7.5 (GLOVE) IMPLANT
GLOVE BIOGEL PI IND STRL 6 (GLOVE) IMPLANT
GLOVE BIOGEL PI IND STRL 6.5 (GLOVE) ×10 IMPLANT
GLOVE BIOGEL PI IND STRL 7.0 (GLOVE) IMPLANT
GLOVE BIOGEL PI INDICATOR 6 (GLOVE)
GLOVE BIOGEL PI INDICATOR 6.5 (GLOVE) ×5
GLOVE BIOGEL PI INDICATOR 7.0 (GLOVE)
GLOVE EUDERMIC 7 POWDERFREE (GLOVE) ×6 IMPLANT
GLOVE ORTHO TXT STRL SZ7.5 (GLOVE) IMPLANT
GOWN STRL REUS W/ TWL LRG LVL3 (GOWN DISPOSABLE) ×12 IMPLANT
GOWN STRL REUS W/ TWL XL LVL3 (GOWN DISPOSABLE) ×4 IMPLANT
GOWN STRL REUS W/TWL LRG LVL3 (GOWN DISPOSABLE) ×6
GOWN STRL REUS W/TWL XL LVL3 (GOWN DISPOSABLE) ×2
HEMOSTAT POWDER SURGIFOAM 1G (HEMOSTASIS) ×9 IMPLANT
HEMOSTAT SURGICEL 2X14 (HEMOSTASIS) ×3 IMPLANT
INSERT FOGARTY 61MM (MISCELLANEOUS) IMPLANT
INSERT FOGARTY XLG (MISCELLANEOUS) ×3 IMPLANT
KIT BASIN OR (CUSTOM PROCEDURE TRAY) ×3 IMPLANT
KIT CATH CPB BARTLE (MISCELLANEOUS) ×3 IMPLANT
KIT ROOM TURNOVER OR (KITS) ×3 IMPLANT
KIT SUCTION CATH 14FR (SUCTIONS) ×3 IMPLANT
KIT VASOVIEW HEMOPRO VH 3000 (KITS) ×3 IMPLANT
NS IRRIG 1000ML POUR BTL (IV SOLUTION) ×18 IMPLANT
PACK OPEN HEART (CUSTOM PROCEDURE TRAY) ×3 IMPLANT
PAD ARMBOARD 7.5X6 YLW CONV (MISCELLANEOUS) ×6 IMPLANT
PAD ELECT DEFIB RADIOL ZOLL (MISCELLANEOUS) ×3 IMPLANT
PENCIL BUTTON HOLSTER BLD 10FT (ELECTRODE) ×6 IMPLANT
PUNCH AORTIC ROT 4.0MM RCL 40 (MISCELLANEOUS) ×3 IMPLANT
PUNCH AORTIC ROTATE 4.0MM (MISCELLANEOUS) IMPLANT
PUNCH AORTIC ROTATE 4.5MM 8IN (MISCELLANEOUS) ×3 IMPLANT
PUNCH AORTIC ROTATE 5MM 8IN (MISCELLANEOUS) IMPLANT
SET CARDIOPLEGIA MPS 5001102 (MISCELLANEOUS) ×3 IMPLANT
SPONGE GAUZE 4X4 12PLY STER LF (GAUZE/BANDAGES/DRESSINGS) ×6 IMPLANT
SPONGE INTESTINAL PEANUT (DISPOSABLE) IMPLANT
SPONGE LAP 18X18 X RAY DECT (DISPOSABLE) ×12 IMPLANT
SPONGE LAP 4X18 X RAY DECT (DISPOSABLE) ×6 IMPLANT
SUT BONE WAX W31G (SUTURE) ×3 IMPLANT
SUT MNCRL AB 4-0 PS2 18 (SUTURE) ×9 IMPLANT
SUT PROLENE 3 0 SH DA (SUTURE) IMPLANT
SUT PROLENE 3 0 SH1 36 (SUTURE) ×3 IMPLANT
SUT PROLENE 4 0 RB 1 (SUTURE)
SUT PROLENE 4 0 SH DA (SUTURE) IMPLANT
SUT PROLENE 4-0 RB1 .5 CRCL 36 (SUTURE) IMPLANT
SUT PROLENE 5 0 C 1 36 (SUTURE) IMPLANT
SUT PROLENE 6 0 C 1 30 (SUTURE) ×3 IMPLANT
SUT PROLENE 7 0 BV 1 (SUTURE) IMPLANT
SUT PROLENE 7 0 BV1 MDA (SUTURE) ×6 IMPLANT
SUT PROLENE 8 0 BV175 6 (SUTURE) ×3 IMPLANT
SUT SILK  1 MH (SUTURE)
SUT SILK 1 MH (SUTURE) IMPLANT
SUT STEEL STERNAL CCS#1 18IN (SUTURE) IMPLANT
SUT STEEL SZ 6 DBL 3X14 BALL (SUTURE) ×9 IMPLANT
SUT VIC AB 1 CTX 36 (SUTURE) ×2
SUT VIC AB 1 CTX36XBRD ANBCTR (SUTURE) ×4 IMPLANT
SUT VIC AB 2-0 CT1 27 (SUTURE)
SUT VIC AB 2-0 CT1 36 (SUTURE) ×6 IMPLANT
SUT VIC AB 2-0 CT1 TAPERPNT 27 (SUTURE) IMPLANT
SUT VIC AB 2-0 CTX 27 (SUTURE) IMPLANT
SUT VIC AB 3-0 SH 27 (SUTURE)
SUT VIC AB 3-0 SH 27X BRD (SUTURE) IMPLANT
SUT VIC AB 3-0 X1 27 (SUTURE) IMPLANT
SUT VICRYL 4-0 PS2 18IN ABS (SUTURE) IMPLANT
SUTURE E-PAK OPEN HEART (SUTURE) ×3 IMPLANT
SYSTEM SAHARA CHEST DRAIN ATS (WOUND CARE) ×3 IMPLANT
TAPE CLOTH SURG 4X10 WHT LF (GAUZE/BANDAGES/DRESSINGS) ×6 IMPLANT
TAPE PAPER 2X10 WHT MICROPORE (GAUZE/BANDAGES/DRESSINGS) ×3 IMPLANT
TOWEL OR 17X24 6PK STRL BLUE (TOWEL DISPOSABLE) ×3 IMPLANT
TOWEL OR 17X26 10 PK STRL BLUE (TOWEL DISPOSABLE) ×3 IMPLANT
TRAY FOLEY IC TEMP SENS 16FR (CATHETERS) ×3 IMPLANT
TUBING INSUFFLATION (TUBING) ×3 IMPLANT
UNDERPAD 30X30 (UNDERPADS AND DIAPERS) ×3 IMPLANT
WATER STERILE IRR 1000ML POUR (IV SOLUTION) ×6 IMPLANT

## 2016-05-24 NOTE — Anesthesia Preprocedure Evaluation (Addendum)
Anesthesia Evaluation  Patient identified by MRN, date of birth, ID band Patient awake    Reviewed: Allergy & Precautions, NPO status , Patient's Chart, lab work & pertinent test results  Airway Mallampati: II  TM Distance: <3 FB Neck ROM: Full    Dental no notable dental hx.    Pulmonary sleep apnea , former smoker,    Pulmonary exam normal breath sounds clear to auscultation       Cardiovascular hypertension, + CAD, + Past MI and +CHF  Normal cardiovascular exam+ pacemaker + Cardiac Defibrillator  Rhythm:Regular Rate:Normal  Cardiac Catheterization Procedure Note  Name: Brian Williams MRN: [id number] DOB: [birth year]  Procedure: Left Heart Cath, Selective Coronary Angiography, LV angiography  Indication: 55 yo male with known CAD, PCI to the LAD and distal RCA, Previously seen in [place] for his cardiac issues. History dates back to 2002 when he had distal RCA disease. Several catheterizations over the next several years for in-stent restenosis of the distal RCA. Later cath, stent placed to the LAD. Most recent catheterization appears to be April 2011 showing patent LAD and RCA stent. History of sustained VT, VT ablation in [place] at [facility] History of sleep apnea, on CPAP,ICD in place, recent ICD generator change in 2016 previously seen by seen by [facility] cardiology heart failure/transplant team, [name].  Presenting with increasing SOB, fatigue, chest pain concerning for unstable angina. He did not want stress testing   Procedural details: The right groin was prepped, draped, and anesthetized with 1% lidocaine. Using modified Seldinger technique, a 5 French sheath was introduced into the right femoral artery. Standard Judkins catheters (JL 5, JR 5 and pigtail catheter) were used for coronary angiography and left ventriculography. Catheter exchanges were performed over a guidewire. There were no immediate  procedural complications. The patient was transferred to the post catheterization recovery area for further monitoring.  Moderate sedation: I was Face to Face with the patient during this time: (code: [phone])   Procedural Findings:  Coronary angiography:  Coronary dominance: Right  Left mainstem: Large vessel that bifurcates into the LAD and left circumflex, 60% eccentric distal left main disease  Left anterior descending (LAD): Large vessel that extends to the apical region, diagonal branch 1 of moderate size. LAD with 80% diffuse ISR of proximal stent, also with 80% proximal D1 disease.  Left circumflex (LCx): Large vessel with OM branch 2, with 70% mid OM 2 disease.  Right coronary artery (RCA): Right dominant vessel with PL and PDA, severe proximal and mid disease estimated at 90%, moderately calcified, patent distal stent with 30% ISR  Left ventriculography: Left ventricular systolic function is moderately depressed, LVEF is estimated at 35 to 40%, there is no significant mitral regurgitation , no significant aortic valve stenosis Akinesis of the inferior wall  Final Conclusions:  Severe proximal LAD disease (ISR) Severe proximal and mid RAC disease Severe OM2 disease  Severe proximal diagonal #1 disease Moderate to severe left main disease   Neuro/Psych negative neurological ROS  negative psych ROS   GI/Hepatic negative GI ROS, Neg liver ROS,   Endo/Other  diabetes, Insulin Dependent  Renal/GU negative Renal ROS  negative genitourinary   Musculoskeletal negative musculoskeletal ROS (+)   Abdominal   Peds negative pediatric ROS (+)  Hematology negative hematology ROS (+)   Anesthesia Other Findings   Reproductive/Obstetrics negative OB ROS  Anesthesia Physical Anesthesia Plan  ASA: IV  Anesthesia Plan: General   Post-op Pain Management:    Induction: Intravenous  Airway Management Planned: Oral  ETT  Additional Equipment: Arterial line, [name], CVP and PA Cath  Intra-op Plan:   Post-operative Plan: Post-operative intubation/ventilation  Informed Consent: I have reviewed the patients History and Physical, chart, labs and discussed the procedure including the risks, benefits and alternatives for the proposed anesthesia with the patient or authorized representative who has indicated his/her understanding and acceptance.   Dental advisory given  Plan Discussed with: CRNA and Surgeon  Anesthesia Plan Comments:         Anesthesia Quick Evaluation

## 2016-05-24 NOTE — Op Note (Addendum)
CARDIOVASCULAR SURGERY OPERATIVE NOTE  05/24/2016  Surgeon:  Gaye Pollack, MD  First Assistant: Lars Pinks, PA-C   Preoperative Diagnosis:  Severe left main and multi-vessel coronary artery disease   Postoperative Diagnosis:  Same   Procedure:  1. Median Sternotomy 2. Extracorporeal circulation 3.   Coronary artery bypass grafting x 4   Left internal mammary graft to the LAD  SVG to diagonal  SVG to OM  SVG to PDA 4.   Endoscopic vein harvest from the left leg   Anesthesia:  General Endotracheal   Clinical History/Surgical Indication:   The patient is a 55 year old gentleman with a history of poorly controlled type 2 DM, hypertension and hyperlipidemia, smoking, coronary artery disease and ischemic cardiomyopathy dating back to 2002. He had distal RCA disease and had a stent placed. He had multiple caths over the next several years for in-stent restenosis and reports having PTCA and brachytherapy. He also had a stent placed in the LAD. A cath in 12/2009 reportedly showed patent LAD and RCA stents. He had an EF of 20-25% by echo in March 2016. He has a history of VT and underwent a VT ablation at [facility] in the past. He had an ICD placed. He reports being seen by the heart failure team at [facility] and [place]. He now reports 6-8 weeks of burning in his chest with exertion such as walking. It radiates into his throat. This resolves with rest. This burning is different from his prior chest pressure. There is associated shortness of breath and fatigue. He saw Dr. Rockey Situ and underwent cath on 05/10/2016 showing a 70% eccentric distal LM stenosis, 80% proximal LAD in-stent restenosis, 80% diagonal stenosis, and tandem 90% proximal and mid RCA stenoses with mild in-stent narrowing. The LVEF is visually 35-45%.  This 55 year old diabetic gentleman has 70% LM and severe 3  vessel coronary disease with moderate LV dysfunction and progressive anginal symptoms. He has a history of ischemic cardiomyopathy with an EF of 25% in the past but his LV function appears improved. I agree that CABG is the best treatment for him. His operative risk is increased due to his morbid obesity, poorly controlled DM, long history of heavy smoking, and reduced EF. I discussed the operative procedure with the patient and his wifeincluding alternatives, benefits and risks; including but not limited to bleeding, blood transfusion, infection, stroke, myocardial infarction, graft failure, heart block requiring a permanent pacemaker, organ dysfunction, and death. Brian Reillyunderstands and agrees to proceed.  Preparation:  The patient was seen in the preoperative holding area and the correct patient, correct operation were confirmed with the patient after reviewing the medical record and catheterization. The consent was signed by me. Preoperative antibiotics were given. A pulmonary arterial line and radial arterial line were placed by the anesthesia team. The patient was taken back to the operating room and positioned supine on the operating room table. After being placed under general endotracheal anesthesia by the anesthesia team a foley catheter was placed. The neck, chest, abdomen, and both legs were prepped with betadine soap and solution and draped in the usual sterile manner. A surgical time-out was taken and the correct patient and operative procedure were confirmed with the nursing and anesthesia staff.   Cardiopulmonary Bypass:  A median sternotomy was performed. The pericardium was opened in the midline. Right ventricular function appeared normal. The ascending aorta was of normal size and had no palpable plaque. There were no contraindications to aortic  cannulation or cross-clamping. The patient was fully systemically heparinized and the ACT was maintained > 400 sec. The proximal aortic  arch was cannulated with a 36 F aortic cannula for arterial inflow. Venous cannulation was performed via the right atrial appendage using a two-staged venous cannula. An antegrade cardioplegia/vent cannula was inserted into the mid-ascending aorta. Aortic occlusion was performed with a single cross-clamp. Systemic cooling to 32 degrees Centigrade and topical cooling of the heart with iced saline were used. Hyperkalemic antegrade cold blood cardioplegia was used to induce diastolic arrest and was then given at about 20 minute intervals throughout the period of arrest to maintain myocardial temperature at or below 10 degrees centigrade. A temperature probe was inserted into the interventricular septum and an insulating pad was placed in the pericardium.   Left internal mammary harvest:  The left side of the sternum was retracted using the Rultract retractor. The left internal mammary artery was harvested as a pedicle graft. All side branches were clipped. It was a medium-sized vessel of good quality with excellent blood flow. It was ligated distally and divided. It was sprayed with topical papaverine solution to prevent vasospasm.   Endoscopic vein harvest:  Initially the right greater saphenous vein was examined through a 2 cm incision medial to the right knee. It was small and not felt to be ideal as a bypass conduit. Therefore I decided to use the left saphenous vein. The left greater saphenous vein was harvested endoscopically through a 2 cm incision medial to the left knee. It was harvested from the upper thigh to below the knee. It was a medium-sized vein of good quality. The side branches were all ligated with 4-0 silk ties.    Coronary arteries:  The coronary arteries were examined.   LAD:  Large vessel with mild distal disease. The diagonal was heavily diseased with calcific plaque proximally but just before it branched the vessel was free of disease and soft.  LCX:  Large OM1 with no  distal disease. The distal OM was small where I could see it and not felt to be graftable.  RCA:  Moderate sized PDA branch that was diseased up to the mid vessel but beyond that was graftable. The PL was small and diffusely diseased. Prior inferior infarct with patchy scar present.   Grafts:  1. LIMA to the LAD: 2.5 mm. It was sewn end to side using 8-0 prolene continuous suture. 2. SVG to diagonal:  1.6 mm. It was sewn end to side using 7-0 prolene continuous suture. 3. SVG to OM:  1.75 mm. It was sewn end to side using 7-0 prolene continuous suture. 4. SVG to PDA:  1.6 mm. It was sewn end to side using 7-0 prolene continuous suture.  The proximal vein graft anastomoses were performed to the mid-ascending aorta using continuous 6-0 prolene suture. Graft markers were placed around the proximal anastomoses.   Completion:  The patient was rewarmed to 37 degrees Centigrade. The clamp was removed from the LIMA pedicle and there was rapid warming of the septum and return of ventricular fibrillation. The crossclamp was removed with a time of 98 minutes. There was spontaneous return of sinus rhythm. The distal and proximal anastomoses were checked for hemostasis. The position of the grafts was satisfactory. Two temporary epicardial pacing wires were placed on the right atrium and two on the right ventricle. The patient was weaned from CPB without difficulty on dopamine 3 mcg. CPB time was 120 minutes. Cardiac output was 6 LPM.  [name] showed improved LV contractility. Heparin was fully reversed with protamine and the aortic and venous cannulas removed. Hemostasis was achieved. Mediastinal and left pleural drainage tubes were placed. The sternum was closed with double #6 stainless steel wires. The fascia was closed with continuous # 1 vicryl suture. The subcutaneous tissue was closed with 2-0 vicryl continuous suture. The skin was closed with 3-0 vicryl subcuticular suture. All sponge, needle, and instrument  counts were reported correct at the end of the case. Dry sterile dressings were placed over the incisions and around the chest tubes which were connected to pleurevac suction. The patient was then transported to the surgical intensive care unit in critical but stable condition.                   *[facility]*                         [address], [place] [address]                            [phone]  ------------------------------------------------------------------- Intraoperative Transesophageal Echocardiography  Patient:    Brian, Williams MR #:       [id number] Study Date: 05/24/2016 Gender:     M Age:        54 Height: Weight: BSA: Pt. Status: Room:       2S08C   ATTENDING    Gilford Raid, MD  ORDERING     Gilford Raid, MD  REFERRING    Gilford Raid, MD  PERFORMING   Myrtie Soman, M.D.  SONOGRAPHER  Diamond Nickel  cc:  ------------------------------------------------------------------- LV EF: 30% -   35%  ------------------------------------------------------------------- Study Conclusions  - Left ventricle: Systolic function was moderately to severely   reduced. The estimated ejection fraction was in the range of 30%   to 35%. - Aortic valve: No evidence of vegetation. - Mitral valve: No evidence of vegetation. - Right atrium: No evidence of thrombus in the atrial cavity or   appendage. - Atrial septum: No defect or patent foramen ovale was identified.   Echo contrast study showed no right-to-left atrial level shunt,   following an increase in RA pressure induced by provocative   maneuvers. - Tricuspid valve: No evidence of vegetation. - Pulmonic valve: No evidence of vegetation.  Intraoperative transesophageal echocardiography.  Birthdate: Patient birthdate: 08-03-1961.  Age:  Patient is 55 yr old.  Sex: Gender: male.  Study date:  Study date: 05/24/2016. Study  time: 07:48 AM.  -------------------------------------------------------------------  ------------------------------------------------------------------- Left ventricle:  Systolic function was moderately to severely reduced. The estimated ejection fraction was in the range of 30% to 35%.  ------------------------------------------------------------------- Aortic valve:   Structurally normal valve.   Cusp separation was normal.  No evidence of vegetation.  Doppler:  There was no regurgitation.  ------------------------------------------------------------------- Aorta:  The aorta was normal, not dilated, and non-diseased.  ------------------------------------------------------------------- Mitral valve:   Structurally normal valve.   Leaflet separation was normal.  No evidence of vegetation.  Doppler:  There was trivial regurgitation.  ------------------------------------------------------------------- Left atrium:  The atrium was normal in size.  ------------------------------------------------------------------- Atrial septum:  No defect or patent foramen  ovale was identified. Echo contrast study showed no right-to-left atrial level shunt, following an increase in RA pressure induced by provocative maneuvers.  ------------------------------------------------------------------- Right ventricle:  The cavity size was normal. Wall thickness was normal. Systolic function was normal.  ------------------------------------------------------------------- Pulmonic valve:    Structurally normal valve.   Cusp separation was normal.  No evidence of vegetation.  ------------------------------------------------------------------- Tricuspid valve:   Structurally normal valve.   Leaflet separation was normal.  No evidence of vegetation.  Doppler:  There was no regurgitation.  ------------------------------------------------------------------- Right atrium:  The atrium was  normal in size.  No evidence of thrombus in the atrial cavity or appendage.  ------------------------------------------------------------------- Pericardium:  The pericardium was normal in appearance. There was no pericardial effusion.  ------------------------------------------------------------------- Pre bypass:  Pre bypass: Post bypass: LV global systolic function was improved from the prior stage. The estimated LV ejection fraction was 40-45%. Cardiac output: 6.5 L/min.  ------------------------------------------------------------------- Post procedure conclusions Ascending Aorta:  - The aorta was normal, not dilated, and non-diseased.  ------------------------------------------------------------------- Prepared and Electronically Authenticated by  Myrtie Soman, M.D. 2017-09-07T14:37:44

## 2016-05-24 NOTE — Progress Notes (Signed)
  Echocardiogram Echocardiogram Transesophageal has been performed.  Diamond Nickel 05/24/2016, 9:17 AM

## 2016-05-24 NOTE — Interval H&P Note (Signed)
History and Physical Interval Note:  05/24/2016 5:44 AM  Brian Williams  has presented today for surgery, with the diagnosis of CAD  The various methods of treatment have been discussed with the patient and family. After consideration of risks, benefits and other options for treatment, the patient has consented to  Procedure(s): CORONARY ARTERY BYPASS GRAFTING (CABG) (N/A) TRANSESOPHAGEAL ECHOCARDIOGRAM ([name]) (N/A) as a surgical intervention .  The patient's history has been reviewed, patient examined, no change in status, stable for surgery.  I have reviewed the patient's chart and labs.  Questions were answered to the patient's satisfaction.     Gaye Pollack

## 2016-05-24 NOTE — Anesthesia Procedure Notes (Signed)
Procedure Name: Intubation Date/Time: 05/24/2016 8:07 AM Performed by: Tressia Miners LEFFEW Pre-anesthesia Checklist: Patient identified, Patient being monitored, Timeout performed, Emergency Drugs available and Suction available Patient Re-evaluated:Patient Re-evaluated prior to inductionOxygen Delivery Method: Circle System Utilized Preoxygenation: Pre-oxygenation with 100% oxygen Intubation Type: IV induction Ventilation: Two handed mask ventilation required Laryngoscope Size: Glidescope and 4 Grade View: Grade I Tube type: Oral Tube size: 8.0 mm Number of attempts: 1 Airway Equipment and Method: Stylet Placement Confirmation: ETT inserted through vocal cords under direct vision,  positive ETCO2 and breath sounds checked- equal and bilateral Secured at: 23 cm Tube secured with: Tape Dental Injury: Teeth and Oropharynx as per pre-operative assessment

## 2016-05-24 NOTE — OR Nursing (Signed)
13:10 - 1st call to SICU charge nurse.  13:40 - 2nd call to SICU

## 2016-05-24 NOTE — Brief Op Note (Signed)
05/24/2016  12:13 PM  PATIENT:  Brian Williams  55 y.o. male  PRE-OPERATIVE DIAGNOSIS:  CAD  POST-OPERATIVE DIAGNOSIS:  CAD  PROCEDURE: TRANSESOPHAGEAL ECHOCARDIOGRAM ([name]), MEDIAN STERNOTOMY for  CORONARY ARTERY BYPASS GRAFTING (CABG) x 4 (LIMA to LAD, SVG to DIAGONAL, SVG to OM, SVG to PDA)  using left internal mammary artery and left leg greater saphenous vein harvested endoscopically   SURGEON:  Surgeon(s) and Role:    * Gaye Pollack, MD - Primary  PHYSICIAN ASSISTANT: Lars Pinks PA-C  ANESTHESIA:   general  EBL:  Total I/O In: 2500 [I.V.:2500] Out: 750 [Urine:750]  DRAINS: Chest tubes placed in the mediastinal and pleural spaces   COUNTS CORRECT:  YES  DICTATION: .Dragon Dictation  PLAN OF CARE: Admit to inpatient   PATIENT DISPOSITION:  ICU - intubated and hemodynamically stable.   Delay start of Pharmacological VTE agent (>24hrs) due to surgical blood loss or risk of bleeding: yes  BASELINE WEIGHT: 133 kg

## 2016-05-24 NOTE — Anesthesia Procedure Notes (Signed)
Procedures

## 2016-05-24 NOTE — Progress Notes (Signed)
Patient ID: Brian Williams, male   DOB: 28-Jul-1961, 55 y.o.   MRN: [id number]  SICU Evening Rounds:   Hemodynamically stable  CI = 2.5 on dop 2  Extubate and alert  Urine output good  CT output low  CBC    Component Value Date/Time   WBC 13.3 (H) 05/24/2016 1436   RBC 3.94 (L) 05/24/2016 1436   HGB 12.2 (L) 05/24/2016 1436   HCT 37.2 (L) 05/24/2016 1436   HCT 36.5 (L) 05/04/2016 1200   PLT 166 05/24/2016 1436   PLT 202 05/04/2016 1200   MCV 94.4 05/24/2016 1436   MCV 94 05/04/2016 1200   MCH 31.0 05/24/2016 1436   MCHC 32.8 05/24/2016 1436   RDW 14.4 05/24/2016 1436   RDW 14.9 05/04/2016 1200   LYMPHSABS 2.2 05/04/2016 1200   EOSABS 0.3 05/04/2016 1200   BASOSABS 0.0 05/04/2016 1200     BMET    Component Value Date/Time   NA 138 05/24/2016 1433   NA 139 05/04/2016 1200   K 5.2 (H) 05/24/2016 1433   CL 103 05/24/2016 1315   CO2 20 (L) 05/22/2016 1506   GLUCOSE 215 (H) 05/24/2016 1433   BUN 14 05/24/2016 1315   BUN 13 05/04/2016 1200   CREATININE 0.70 05/24/2016 1315   CALCIUM 9.7 05/22/2016 1506   GFRNONAA >60 05/22/2016 1506   GFRAA >60 05/22/2016 1506     A/P:  Stable postop course. Continue current plans

## 2016-05-24 NOTE — Procedures (Signed)
Extubation Procedure Note  Patient Details:   Name: Brian Williams DOB: January 09, 1961 MRN: [id number]   Airway Documentation:     Evaluation  O2 sats: stable throughout Complications: No apparent complications Patient did tolerate procedure well. Bilateral Breath Sounds: Clear, Diminished   Yes  4l/min [place] NIF-60 FVC-954ml  Revonda Standard 05/24/2016, 6:36 PM

## 2016-05-24 NOTE — Transfer of Care (Signed)
Immediate Anesthesia Transfer of Care Note  Patient: Brian Williams  Procedure(s) Performed: Procedure(s): CORONARY ARTERY BYPASS GRAFTING (CABG) x four , using left internal mammary artery and left leg greater saphenous vein harvested endoscopically (N/A) TRANSESOPHAGEAL ECHOCARDIOGRAM ([name]) (N/A)  Patient Location: SICU  Anesthesia Type:General  Level of Consciousness: Patient remains intubated per anesthesia plan  Airway & Oxygen Therapy: Patient remains intubated per anesthesia plan  Post-op Assessment: Report given to RN and Post -op Vital signs reviewed and stable  Post vital signs: Reviewed and stable  Last Vitals:  Vitals:   05/24/16 0725 05/24/16 0726  BP:    Pulse: 81 81  Resp: 10 12  Temp:      Last Pain: There were no vitals filed for this visit.    Patients Stated Pain Goal: 1 ([id number])  Complications: No apparent anesthesia complications

## 2016-05-24 NOTE — Anesthesia Postprocedure Evaluation (Signed)
Anesthesia Post Note  Patient: Brian Williams  Procedure(s) Performed: Procedure(s) (LRB): CORONARY ARTERY BYPASS GRAFTING (CABG) x four , using left internal mammary artery and left leg greater saphenous vein harvested endoscopically (N/A) TRANSESOPHAGEAL ECHOCARDIOGRAM ([name]) (N/A)  Patient location during evaluation: SICU Anesthesia Type: General Level of consciousness: sedated and patient remains intubated per anesthesia plan Pain management: pain level controlled Vital Signs Assessment: post-procedure vital signs reviewed and stable Respiratory status: patient remains intubated per anesthesia plan Cardiovascular status: stable Anesthetic complications: no    Last Vitals:  Vitals:   05/24/16 0725 05/24/16 0726  BP:    Pulse: 81 81  Resp: 10 12  Temp:      Last Pain: There were no vitals filed for this visit.               Karel Turpen S

## 2016-05-25 ENCOUNTER — Inpatient Hospital Stay (HOSPITAL_COMMUNITY): Payer: BLUE CROSS/BLUE SHIELD

## 2016-05-25 ENCOUNTER — Encounter (HOSPITAL_COMMUNITY): Payer: Self-pay | Admitting: Surgery

## 2016-05-25 LAB — GLUCOSE, CAPILLARY
Glucose-Capillary: 123 mg/dL — ABNORMAL HIGH (ref 65–99)
Glucose-Capillary: 125 mg/dL — ABNORMAL HIGH (ref 65–99)
Glucose-Capillary: 126 mg/dL — ABNORMAL HIGH (ref 65–99)
Glucose-Capillary: 135 mg/dL — ABNORMAL HIGH (ref 65–99)
Glucose-Capillary: 138 mg/dL — ABNORMAL HIGH (ref 65–99)
Glucose-Capillary: 140 mg/dL — ABNORMAL HIGH (ref 65–99)
Glucose-Capillary: 142 mg/dL — ABNORMAL HIGH (ref 65–99)
Glucose-Capillary: 144 mg/dL — ABNORMAL HIGH (ref 65–99)
Glucose-Capillary: 146 mg/dL — ABNORMAL HIGH (ref 65–99)
Glucose-Capillary: 148 mg/dL — ABNORMAL HIGH (ref 65–99)
Glucose-Capillary: 150 mg/dL — ABNORMAL HIGH (ref 65–99)
Glucose-Capillary: 171 mg/dL — ABNORMAL HIGH (ref 65–99)
Glucose-Capillary: 187 mg/dL — ABNORMAL HIGH (ref 65–99)
Glucose-Capillary: 198 mg/dL — ABNORMAL HIGH (ref 65–99)
Glucose-Capillary: 235 mg/dL — ABNORMAL HIGH (ref 65–99)
Glucose-Capillary: 242 mg/dL — ABNORMAL HIGH (ref 65–99)

## 2016-05-25 LAB — CBC
HCT: 32.5 % — ABNORMAL LOW (ref 39.0–52.0)
HCT: 30.6 % — ABNORMAL LOW (ref 39.0–52.0)
Hemoglobin: 10.3 g/dL — ABNORMAL LOW (ref 13.0–17.0)
Hemoglobin: 9.7 g/dL — ABNORMAL LOW (ref 13.0–17.0)
MCH: 29.9 pg (ref 26.0–34.0)
MCH: 30.6 pg (ref 26.0–34.0)
MCHC: 31.7 g/dL (ref 30.0–36.0)
MCHC: 31.7 g/dL (ref 30.0–36.0)
MCV: 94.5 fL (ref 78.0–100.0)
MCV: 96.5 fL (ref 78.0–100.0)
Platelets: 176 10*3/uL (ref 150–400)
Platelets: 152 10*3/uL (ref 150–400)
RBC: 3.44 MIL/uL — ABNORMAL LOW (ref 4.22–5.81)
RBC: 3.17 MIL/uL — ABNORMAL LOW (ref 4.22–5.81)
RDW: 14.8 % (ref 11.5–15.5)
RDW: 15.1 % (ref 11.5–15.5)
WBC: 10.2 10*3/uL (ref 4.0–10.5)
WBC: 8.4 10*3/uL (ref 4.0–10.5)

## 2016-05-25 LAB — MAGNESIUM
Magnesium: 2.3 mg/dL (ref 1.7–2.4)
Magnesium: 2.5 mg/dL — ABNORMAL HIGH (ref 1.7–2.4)

## 2016-05-25 LAB — BASIC METABOLIC PANEL
Anion gap: 7 (ref 5–15)
BUN: 9 mg/dL (ref 6–20)
CO2: 24 mmol/L (ref 22–32)
Calcium: 8 mg/dL — ABNORMAL LOW (ref 8.9–10.3)
Chloride: 106 mmol/L (ref 101–111)
Creatinine, Ser: 0.72 mg/dL (ref 0.61–1.24)
GFR calc Af Amer: >60 mL/min (ref >60)
GFR calc non Af Amer: >60 mL/min (ref >60)
Glucose, Bld: 148 mg/dL — ABNORMAL HIGH (ref 65–99)
Potassium: 3.9 mmol/L (ref 3.5–5.1)
Sodium: 137 mmol/L (ref 135–145)

## 2016-05-25 LAB — POCT I-STAT, CHEM 8
BUN: 13 mg/dL (ref 6–20)
Calcium, Ion: 1.19 mmol/L (ref 1.15–1.40)
Chloride: 104 mmol/L (ref 101–111)
Creatinine, Ser: 0.8 mg/dL (ref 0.61–1.24)
Glucose, Bld: 233 mg/dL — ABNORMAL HIGH (ref 65–99)
HCT: 28 % — ABNORMAL LOW (ref 39.0–52.0)
Hemoglobin: 9.5 g/dL — ABNORMAL LOW (ref 13.0–17.0)
Potassium: 4.1 mmol/L (ref 3.5–5.1)
Sodium: 137 mmol/L (ref 135–145)
TCO2: 25 mmol/L (ref 0–100)

## 2016-05-25 LAB — CREATININE, SERUM
Creatinine, Ser: 0.89 mg/dL (ref 0.61–1.24)
GFR calc Af Amer: >60 mL/min (ref >60)
GFR calc non Af Amer: >60 mL/min (ref >60)

## 2016-05-25 MED ORDER — INSULIN ASPART 100 UNIT/ML ~~LOC~~ SOLN
6.0000 [IU] | Freq: Three times a day (TID) | SUBCUTANEOUS | Status: DC
  Administered 2016-05-25 – 2016-05-30 (×10): 6 [IU] via SUBCUTANEOUS

## 2016-05-25 MED ORDER — ENOXAPARIN SODIUM 40 MG/0.4ML ~~LOC~~ SOLN
40.0000 mg | Freq: Every day | SUBCUTANEOUS | Status: DC
  Administered 2016-05-25 – 2016-05-29 (×5): 40 mg via SUBCUTANEOUS
  Filled 2016-05-25 (×5): qty 0.4

## 2016-05-25 MED ORDER — OXYCODONE HCL 5 MG PO TABS
15.0000 mg | ORAL_TABLET | ORAL | Status: DC | PRN
  Administered 2016-05-25 – 2016-05-30 (×21): 15 mg via ORAL
  Filled 2016-05-25 (×21): qty 3

## 2016-05-25 MED ORDER — CARVEDILOL 6.25 MG PO TABS
6.2500 mg | ORAL_TABLET | Freq: Two times a day (BID) | ORAL | Status: DC
  Administered 2016-05-25 – 2016-05-28 (×6): 6.25 mg via ORAL
  Filled 2016-05-25 (×6): qty 1

## 2016-05-25 MED ORDER — INSULIN DETEMIR 100 UNIT/ML ~~LOC~~ SOLN: 30.0000 [IU] | Freq: Every day | SUBCUTANEOUS | Status: DC

## 2016-05-25 MED ORDER — INSULIN DETEMIR 100 UNIT/ML ~~LOC~~ SOLN
30.0000 [IU] | Freq: Every day | SUBCUTANEOUS | Status: DC
  Administered 2016-05-25 – 2016-05-26 (×2): 30 [IU] via SUBCUTANEOUS
  Filled 2016-05-25 (×2): qty 0.3

## 2016-05-25 MED ORDER — INSULIN ASPART 100 UNIT/ML ~~LOC~~ SOLN
0.0000 [IU] | SUBCUTANEOUS | Status: DC
  Administered 2016-05-25 (×2): 4 [IU] via SUBCUTANEOUS
  Administered 2016-05-25 – 2016-05-26 (×2): 12 [IU] via SUBCUTANEOUS
  Administered 2016-05-26 (×2): 8 [IU] via SUBCUTANEOUS

## 2016-05-25 MED FILL — Electrolyte-R (PH 7.4) Solution: INTRAVENOUS | Qty: 3000 | Status: AC

## 2016-05-25 MED FILL — Mannitol IV Soln 20%: INTRAVENOUS | Qty: 500 | Status: AC

## 2016-05-25 MED FILL — Sodium Bicarbonate IV Soln 8.4%: INTRAVENOUS | Qty: 50 | Status: AC

## 2016-05-25 MED FILL — Gelatin Absorbable MT Powder: OROMUCOSAL | Qty: 1 | Status: AC

## 2016-05-25 MED FILL — Heparin Sodium (Porcine) Inj 1000 Unit/ML: INTRAMUSCULAR | Qty: 10 | Status: AC

## 2016-05-25 MED FILL — Lidocaine HCl IV Inj 20 MG/ML: INTRAVENOUS | Qty: 5 | Status: AC

## 2016-05-25 MED FILL — Sodium Chloride IV Soln 0.9%: INTRAVENOUS | Qty: 2000 | Status: AC

## 2016-05-25 NOTE — Progress Notes (Signed)
1 Day Post-Op Procedure(s) (LRB): CORONARY ARTERY BYPASS GRAFTING (CABG) x four , using left internal mammary artery and left leg greater saphenous vein harvested endoscopically (N/A) TRANSESOPHAGEAL ECHOCARDIOGRAM ([name]) (N/A) Subjective: Sore but otherwise ok  Objective: Vital signs in last 24 hours: Temp:  [98.8 F (37.1 C)-100.4 F (38 C)] 99 F (37.2 C) (09/08 0700) Pulse Rate:  [95-105] 96 (09/08 0700) Cardiac Rhythm: Normal sinus rhythm (09/08 0400) Resp:  [15-33] 28 (09/08 0700) BP: (90-111)/(53-73) 94/60 (09/08 0700) SpO2:  [90 %-100 %] 95 % (09/08 0700) Arterial Line BP: (87-136)/(49-83) 103/54 (09/08 0700) FiO2 (%):  [40 %-50 %] 40 % (09/07 1800) Weight:  [136.4 kg (300 lb 11.3 oz)] 136.4 kg (300 lb 11.3 oz) (09/08 0500)  Hemodynamic parameters for last 24 hours: PAP: (24-50)/(8-34) 35/15 CO:  [6.3 L/min-9 L/min] 9 L/min CI:  [2.5 L/min/m2-3.6 L/min/m2] 3.6 L/min/m2  Intake/Output from previous day: 09/07 0701 - 09/08 0700 In: 6039.5 [P.O.:360; I.V.:4464.5; Blood:485; NG/GT:30; IV Piggyback:700] Out: [id number] [Urine:2825; Emesis/NG output:200; Blood:900; Chest Tube:540] Intake/Output this shift: No intake/output data recorded.  General appearance: alert and cooperative Neurologic: intact Heart: regular rate and rhythm, S1, S2 normal, no murmur, click, rub or gallop Lungs: clear to auscultation bilaterally Extremities: edema mild Wound: dressings dry  Lab Results:  Recent Labs  05/24/16 2015 05/25/16 0359  WBC 11.7* 10.2  HGB 11.0* 10.3*  HCT 32.4* 32.5*  PLT 185 176   BMET:  Recent Labs  05/22/16 1506  05/24/16 2013 05/24/16 2015 05/25/16 0359  NA 139  < > 141  --  137  K 3.9  < > 4.1  --  3.9  CL 109  < > 106  --  106  CO2 20*  --   --   --  24  GLUCOSE 99  < > 145*  --  148*  BUN 11  < > 9  --  9  CREATININE 0.74  < > 0.60* 0.64 0.72  CALCIUM 9.7  --   --   --  8.0*  < > = values in this interval not displayed.  PT/INR:  Recent Labs  05/24/16 1436  LABPROT 15.8*  INR 1.25   ABG    Component Value Date/Time   PHART 7.284 (L) 05/24/2016 1944   HCO3 22.9 05/24/2016 1944   TCO2 23 05/24/2016 2013   ACIDBASEDEF 4.0 (H) 05/24/2016 1944   O2SAT 92.0 05/24/2016 1944   CBG (last 3)   Recent Labs  05/25/16 0459 05/25/16 0558 05/25/16 0644  GLUCAP 142* 150* 148*   CXR: bibasilar atelectasis  ECG: sinus, no acute changes  Assessment/Plan: S/P Procedure(s) (LRB): CORONARY ARTERY BYPASS GRAFTING (CABG) x four , using left internal mammary artery and left leg greater saphenous vein harvested endoscopically (N/A) TRANSESOPHAGEAL ECHOCARDIOGRAM ([name]) (N/A)   He is hemodynamically stable in sinus rhythm. EF was 35% preop and had been 25% in the past. He has an ICD and it is turned on. LV function appeared better after bypass. Will wean off dopamine this am. Plan to resume Coreg and Entresto as BP allows.  Mobilize Diuresis once stable off dopamine Diabetes control: Preop Hgb A1c was 9.0. Will start Levemir and meal coverage today. Start SSI and DC drip. He was on insulin pump at home but non-compliant with diet. d/c tubes/lines Continue foley due to patient in ICU and urinary output monitoring See progression orders   LOS: 1 day    Gaye Pollack 05/25/2016

## 2016-05-25 NOTE — Discharge Summary (Signed)
Physician Discharge Summary  Patient ID: Brian Williams MRN: [id number] DOB/AGE: [id number] 55 y.o.  Admit date: 05/24/2016 Discharge date: 05/30/2016  Admission Diagnoses: CAD  Discharge Diagnoses:  Active Problems:   Coronary artery disease  Patient Active Problem List   Diagnosis Date Noted  . Coronary artery disease 05/24/2016  . Coronary artery disease involving native coronary artery of native heart with angina pectoris with documented spasm ([place])   . History of coronary artery stent placement   . Unstable angina ([place]) 05/04/2016  . Memory disturbance 01/06/2016  . OSA on CPAP 01/06/2016  . Diabetes, polyneuropathy ([place]) 01/06/2016  . Insomnia 01/06/2016  . Hidradenitis suppurativa 10/06/2015  . Preventative health care 10/06/2015  . Chronic back pain 05/02/2015  . Essential hypertension 05/02/2015  . Hyperlipidemia 05/02/2015  . Generalized anxiety disorder 05/02/2015  . ICD (implantable cardioverter-defibrillator) in place 04/20/2015  . Congestive dilated cardiomyopathy ([place]) 04/20/2015  . Diabetes mellitus type 2, uncontrolled, with complications ([place]) [id number]  . Chronic systolic heart failure ([place]) 09/30/2014  . HLD (hyperlipidemia) 09/30/2014   HPI:  The patient is a 55 year old gentleman with a history of poorly controlled type 2 DM, hypertension and hyperlipidemia, smoking, coronary artery disease and ischemic cardiomyopathy dating back to 2002. He had distal RCA disease and had a stent placed. He had multiple caths over the next several years for in-stent restenosis and reports having PTCA and brachytherapy. He also had a stent placed in the LAD. A cath in 12/2009 reportedly showed patent LAD and RCA stents. He had an EF of 20-25% by echo in March 2016. He has a history of VT and underwent a VT ablation at [facility] in the past. He had an ICD placed. He reports being seen by the heart failure team at [facility] and [facility]. He now reports 6-8 weeks of burning in  his chest with exertion such as walking. It radiates into his throat. This resolves with rest. This burning is different from his prior chest pressure. There is associated shortness of breath and fatigue. He saw Dr. Rockey Situ and underwent cath on 05/10/2016 showing a 70% eccentric distal LM stenosis, 80% proximal LAD in-stent restenosis, 80% diagonal stenosis, and tandem 90% proximal and mid RCA stenoses with mild in-stent narrowing. The LVEF is visually 35-45%.  Discharged Condition: good  Hospital Course:  The patient was extubated the evening of surgery without difficulty. He remained afebrile and hemodynamically stable. He was weaned off of Dopamine drip. [name], a line, chest tubes, and foley were removed early in the post operative course. He has OSA and was restarted on CPAP. He was restarted on Coreg and Spironolactone. He was volume over loaded and diuresed after weaned off Dopamine. He had ABL anemia. He did not require a post op transfusion. His last H and H was 8.7 and 27.6. He was weaned off the insulin drip. The patient's HGA1C pre op was 9. He was started on Insulin and this was adjusted accordingly for better glucose control. He was not restarted on Farxiga because the hospital did not have this. He will be restarted on this after discharge. He will also resume his Insulin pump and will need close medical follow up with his medical doctor after discharge. Diabetes coordinator and dietary consults were obtained during his hospitalization. The patient was felt surgically stable for transfer from the ICU to [facility]U for further convalescence on 05/27/2016. He continues to progress with cardiac rehab. He was ambulating on room air. He has been  tolerating a diet and has had a bowel movement. Epicardial pacing wires were removed on 05/28/2016. He was restarted on Entresto and Crestor as taken pre op. Chest tube sutures will be removed after discharge in the office. The patient is felt surgically stable  for discharge today.  Consults: cardiology  Significant Diagnostic Studies : routine  Post-op labs /CXR's CLINICAL DATA:  CABG 4 days ago.  EXAM: CHEST  2 VIEW  COMPARISON:  05/26/2016  FINDINGS: 0558 hours. Low volume film. The cardio pericardial silhouette is enlarged. Interval improvement in lung aeration with some persistent patchy airspace disease right mid lung and linear probable atelectasis left base.  IMPRESSION: Interval improvement in lung aeration.   Electronically Signed   By: [name] M.D.   On: 05/27/2016 08:20   Treatments: surgery:                                                                                             CARDIOVASCULAR SURGERY OPERATIVE NOTE  05/24/2016  Surgeon:  Gaye Pollack, MD  First Assistant: Lars Pinks, PA-C   Preoperative Diagnosis:  Severe left main and multi-vessel coronary artery disease   Postoperative Diagnosis:  Same   Procedure:  1. Median Sternotomy 2. Extracorporeal circulation 3.   Coronary artery bypass grafting x 4   Left internal mammary graft to the LAD  SVG to diagonal  SVG to OM  SVG to PDA 4.   Endoscopic vein harvest from the left leg   Anesthesia:  General Endotracheal   Discharge Exam: Blood pressure (!) 101/51, pulse 85, temperature 98.8 F (37.1 C), temperature source Oral, resp. rate 19, weight 284 lb (128.8 kg), SpO2 94 %.  Cardiovascular: RRR Pulmonary: Mostly clear to auscultation bilaterally Abdomen: Soft, non tender, bowel sounds present. Extremities: Mild bilateral lower extremity edema. Venous stasis changes bilaterally Wounds: Clean and dry.  No erythema or signs of infection.  Disposition: 01-Home or Self Care  Discharge Instructions    Ambulatory referral to Nutrition and Diabetic Education    Complete by:  As directed    Would benefit from one on one. Insulin pump @ home. Needs tight control.       Medication List    STOP  taking these medications   clopidogrel 75 MG tablet Commonly known as:  PLAVIX   nitroGLYCERIN 0.4 MG SL tablet Commonly known as:  NITROSTAT     TAKE these medications   ALPRAZolam 1 MG tablet Commonly known as:  XANAX Take 1 tablet (1 mg total) by mouth 3 (three) times daily as needed. What changed:  reasons to take this   aspirin 325 MG EC tablet Take 1 tablet (325 mg total) by mouth daily. What changed:  medication strength  how much to take   carvedilol 12.5 MG tablet Commonly known as:  COREG Take 1 tablet (12.5 mg total) by mouth 2 (two) times daily with a meal. What changed:  medication strength  how much to take   ezetimibe 10 MG tablet Commonly known as:  ZETIA Take 1 tablet (10 mg total) by mouth daily.   FARXIGA 5 MG Tabs tablet  Generic drug:  dapagliflozin propanediol Take 5 mg by mouth daily.   fenofibrate 145 MG tablet Commonly known as:  TRICOR Take 145 mg by mouth daily.   furosemide 40 MG tablet Commonly known as:  LASIX Take 40 mg by mouth daily.   gemfibrozil 600 MG tablet Commonly known as:  LOPID Take 1 tablet (600 mg total) by mouth daily.   HUMULIN R 500 UNIT/ML injection Generic drug:  insulin regular human CONCENTRATED Continuous rate plus boluses on a sliding scale.   KLOR-CON M10 10 MEQ tablet Generic drug:  potassium chloride Take 10 mEq by mouth daily.   Oxycodone HCl 10 MG Tabs Take 1 tablet (10 mg total) by mouth every 4 (four) hours as needed for severe pain.   rosuvastatin 40 MG tablet Commonly known as:  CRESTOR Take 1 tablet (40 mg total) by mouth daily.   sacubitril-valsartan 49-51 MG Commonly known as:  ENTRESTO Take 1 tablet by mouth 2 (two) times daily.   sertraline 100 MG tablet Commonly known as:  ZOLOFT TAKE 1 TABLET (100 MG TOTAL) BY MOUTH DAILY.   spironolactone 25 MG tablet Commonly known as:  ALDACTONE Take 1 tablet (25 mg total) by mouth daily.   Vitamin D 2000 units tablet Take 2,000 Units  by mouth daily.      Follow-up Information    Gaye Pollack, MD Follow up on 06/27/2016.   Specialty:  Cardiothoracic Surgery Why:  PA/LAT CXR to be taken (at [facility] which is in the same building as Dr. Vivi Martens office) on 06/27/2016 at 10:30 am;Appointment time is at 11:00 am Contact information: [place] [address] [phone]        Christopher Berge, NP Follow up on 06/11/2016.   Specialties:  Nurse Practitioner, Cardiology, Radiology Why:  Appointment time is at 2:30 pm Contact information: [id number] [place] [address] [phone]        Nurse Follow up on 06/07/2016.   Why:  Appointment is with nurse only to have chest tube sutures removed. Appointment time is at 10:30 am Contact information: [place] [address]       Jayce G Cook, [place] .   Specialty:  Family Medicine Why:  Call for a follow up appointment regarding further diabetes management and surveillance of HGA1C 9 Contact information: [address] [place] [address] [phone]          The patient has been discharged on:   1.Beta Blocker:  Yes [  x ]                              No   [   ]                              If No, reason:  2.Ace Inhibitor/ARB: Yes [ x  ]                                     No  [    ]                                     If  No, reason:  3.Statin:   Yes [  x ]                  No  [   ]                  If No, reason:  4.Ecasa:  Yes  [ x ]                  No   [   ]                  If No, reason:  Signed: ZIMMERMAN,DONIELLE M PA-C 05/30/2016, 8:10 AM

## 2016-05-25 NOTE — Progress Notes (Signed)
Pt's home CPAP machine does not allow for O2 bleed in and caused desaturation of SpO2 below 88%. Pt transferred to Resp Dept CPAP machine with O2 flow bleed in and maintained SAT's >92%.

## 2016-05-25 NOTE — Discharge Instructions (Signed)
Endoscopic Saphenous Vein Harvesting, Care After °Refer to this sheet in the next few weeks. These instructions provide you with information on caring for yourself after your procedure. Your health care provider may also give you more specific instructions. Your treatment has been planned according to current medical practices, but problems sometimes occur. Call your health care provider if you have any problems or questions after your procedure. °HOME CARE INSTRUCTIONS °Medicine °· Take whatever pain medicine your surgeon prescribes. Follow the directions carefully. Do not take over-the-counter pain medicine unless your surgeon says it is okay. Some pain medicine can cause bleeding problems for several weeks after surgery. °· Follow your surgeon's instructions about driving. You will probably not be permitted to drive after heart surgery. °· Take any medicines your surgeon prescribes. Any medicines you took before your heart surgery should be checked with your health care provider before you start taking them again. °Wound care °· If your surgeon has prescribed an elastic bandage or stocking, ask how long you should wear it. °· Check the area around your surgical cuts (incisions) whenever your bandages (dressings) are changed. Look for any redness or swelling. °· You will need to return to have the stitches (sutures) or staples taken out. Ask your surgeon when to do that. °· Ask your surgeon when you can shower or bathe. °Activity °· Try to keep your legs raised when you are sitting. °· Do any exercises your health care providers have given you. These may include deep breathing exercises, coughing, walking, or other exercises. °SEEK MEDICAL CARE IF: °· You have any questions about your medicines. °· You have more leg pain, especially if your pain medicine stops working. °· New or growing bruises develop on your leg. °· Your leg swells, feels tight, or becomes red. °· You have numbness in your leg. °SEEK IMMEDIATE  MEDICAL CARE IF: °· Your pain gets much worse. °· Blood or fluid leaks from any of the incisions. °· Your incisions become warm, swollen, or red. °· You have chest pain. °· You have trouble breathing. °· You have a fever. °· You have more pain near your leg incision. °MAKE SURE YOU: °· Understand these instructions. °· Will watch your condition. °· Will get help right away if you are not doing well or get worse. °  °This information is not intended to replace advice given to you by your health care provider. Make sure you discuss any questions you have with your health care provider. °  °Document Released: 05/16/2011 Document Revised: 09/24/2014 Document Reviewed: 05/16/2011 °Elsevier Interactive Patient Education ©2016 [employer] °Coronary Artery Bypass Grafting, Care After °These instructions give you information on caring for yourself after your procedure. Your doctor may also give you more specific instructions. Call your doctor if you have any problems or questions after your procedure.  °HOME CARE °· Only take medicine as told by your doctor. Take medicines exactly as told. Do not stop taking medicines or start any new medicines without talking to your doctor first. °· Take your pulse as told by your doctor. °· Do deep breathing as told by your doctor. Use your breathing device (incentive spirometer), if given, to practice deep breathing several times a day. Support your chest with a pillow or your arms when you take deep breaths or cough. °· Keep the area clean, dry, and protected where the surgery cuts (incisions) were made. Remove bandages (dressings) only as told by your doctor. If strips were applied to surgical area, do not take   them off. They fall off on their own. °· Check the surgery area daily for puffiness (swelling), redness, or leaking fluid. °· If surgery cuts were made in your legs: °· Avoid crossing your legs. °· Avoid sitting for long periods of time. Change positions every 30  minutes. °· Raise your legs when you are sitting. Place them on pillows. °· Wear stockings that help keep blood clots from forming in your legs (compression stockings). °· Only take sponge baths until your doctor says it is okay to take showers. Pat the surgery area dry. Do not rub the surgery area with a washcloth or towel. Do not bathe, swim, or use a hot tub until your doctor says it is okay. °· Eat foods that are high in fiber. These include raw fruits and vegetables, whole grains, beans, and nuts. Choose lean meats. Avoid canned, processed, and fried foods. °· Drink enough fluids to keep your pee (urine) clear or pale yellow. °· Weigh yourself every day. °· Rest and limit activity as told by your doctor. You may be told to: °· Stop any activity if you have chest pain, shortness of breath, changes in heartbeat, or dizziness. Get help right away if this happens. °· Move around often for short amounts of time or take short walks as told by your doctor. Gradually become more active. You may need help to strengthen your muscles and build endurance. °· Avoid lifting, pushing, or pulling anything heavier than 10 pounds (4.5 kg) for at least 6 weeks after surgery. °· Do not drive until your doctor says it is okay. °· Ask your doctor when you can go back to work. °· Ask your doctor when you can begin sexual activity again. °· Follow up with your doctor as told. °GET HELP IF: °· You have puffiness, redness, more pain, or fluid draining from the incision site. °· You have a fever. °· You have puffiness in your ankles or legs. °· You have pain in your legs. °· You gain 2 or more pounds (0.9 kg) a day. °· You feel sick to your stomach (nauseous) or throw up (vomit). °· You have watery poop (diarrhea). °GET HELP RIGHT AWAY IF: °· You have chest pain that goes to your jaw or arms. °· You have shortness of breath. °· You have a fast or irregular heartbeat. °· You notice a "clicking" in your breastbone when you move. °· You  have numbness or weakness in your arms or legs. °· You feel dizzy or light-headed. °MAKE SURE YOU: °· Understand these instructions. °· Will watch your condition. °· Will get help right away if you are not doing well or get worse. °  °This information is not intended to replace advice given to you by your health care provider. Make sure you discuss any questions you have with your health care provider. °  °Document Released: 09/08/2013 Document Reviewed: 09/08/2013 °Elsevier Interactive Patient Education ©2016 Elsevier Inc. ° °

## 2016-05-25 NOTE — Progress Notes (Signed)
TCTS BRIEF SICU PROGRESS NOTE  1 Day Post-Op  S/P Procedure(s) (LRB): CORONARY ARTERY BYPASS GRAFTING (CABG) x four , using left internal mammary artery and left leg greater saphenous vein harvested endoscopically (N/A) TRANSESOPHAGEAL ECHOCARDIOGRAM ([name]) (N/A)   Stable day NSR w/ stable BP off drips Breathing comfortably w/ O2 sats 95% UOP adequate Labs okay  Plan: Continue current plan  Rexene Alberts, MD 05/25/2016 6:16 PM

## 2016-05-26 ENCOUNTER — Inpatient Hospital Stay (HOSPITAL_COMMUNITY): Payer: BLUE CROSS/BLUE SHIELD

## 2016-05-26 LAB — BASIC METABOLIC PANEL
Anion gap: 6 (ref 5–15)
BUN: 13 mg/dL (ref 6–20)
CO2: 26 mmol/L (ref 22–32)
Calcium: 8.4 mg/dL — ABNORMAL LOW (ref 8.9–10.3)
Chloride: 104 mmol/L (ref 101–111)
Creatinine, Ser: 0.86 mg/dL (ref 0.61–1.24)
GFR calc Af Amer: >60 mL/min (ref >60)
GFR calc non Af Amer: >60 mL/min (ref >60)
Glucose, Bld: 218 mg/dL — ABNORMAL HIGH (ref 65–99)
Potassium: 4.1 mmol/L (ref 3.5–5.1)
Sodium: 136 mmol/L (ref 135–145)

## 2016-05-26 LAB — GLUCOSE, CAPILLARY
Glucose-Capillary: 222 mg/dL — ABNORMAL HIGH (ref 65–99)
Glucose-Capillary: 251 mg/dL — ABNORMAL HIGH (ref 65–99)
Glucose-Capillary: 241 mg/dL — ABNORMAL HIGH (ref 65–99)
Glucose-Capillary: 235 mg/dL — ABNORMAL HIGH (ref 65–99)
Glucose-Capillary: 224 mg/dL — ABNORMAL HIGH (ref 65–99)

## 2016-05-26 LAB — CBC
HCT: 28.4 % — ABNORMAL LOW (ref 39.0–52.0)
Hemoglobin: 9.2 g/dL — ABNORMAL LOW (ref 13.0–17.0)
MCH: 31.2 pg (ref 26.0–34.0)
MCHC: 32.4 g/dL (ref 30.0–36.0)
MCV: 96.3 fL (ref 78.0–100.0)
Platelets: 141 10*3/uL — ABNORMAL LOW (ref 150–400)
RBC: 2.95 MIL/uL — ABNORMAL LOW (ref 4.22–5.81)
RDW: 15.2 % (ref 11.5–15.5)
WBC: 9.9 10*3/uL (ref 4.0–10.5)

## 2016-05-26 MED ORDER — SPIRONOLACTONE 25 MG PO TABS
25.0000 mg | ORAL_TABLET | Freq: Every day | ORAL | Status: DC
  Administered 2016-05-26 – 2016-05-27 (×2): 25 mg via ORAL
  Filled 2016-05-26 (×2): qty 1

## 2016-05-26 MED ORDER — SODIUM CHLORIDE 0.9% FLUSH
3.0000 mL | Freq: Two times a day (BID) | INTRAVENOUS | Status: DC
  Administered 2016-05-26 – 2016-05-30 (×6): 3 mL via INTRAVENOUS

## 2016-05-26 MED ORDER — SODIUM CHLORIDE 0.9% FLUSH
3.0000 mL | INTRAVENOUS | Status: DC | PRN
Start: 2016-05-26 — End: 2016-05-30

## 2016-05-26 MED ORDER — MOVING RIGHT ALONG BOOK
Freq: Once | Status: AC
  Administered 2016-05-26: 11:00:00
  Filled 2016-05-26: qty 1

## 2016-05-26 MED ORDER — FUROSEMIDE 10 MG/ML IJ SOLN
40.0000 mg | Freq: Two times a day (BID) | INTRAMUSCULAR | Status: DC
  Administered 2016-05-26: 40 mg via INTRAVENOUS
  Filled 2016-05-26: qty 4

## 2016-05-26 MED ORDER — POTASSIUM CHLORIDE CRYS ER 20 MEQ PO TBCR: 20.0000 meq | EXTENDED_RELEASE_TABLET | Freq: Every day | ORAL | Status: DC

## 2016-05-26 MED ORDER — SODIUM CHLORIDE 0.9 % IV SOLN: 250.0000 mL | INTRAVENOUS | Status: DC | PRN

## 2016-05-26 MED ORDER — INSULIN ASPART 100 UNIT/ML ~~LOC~~ SOLN
0.0000 [IU] | Freq: Three times a day (TID) | SUBCUTANEOUS | Status: DC
  Administered 2016-05-26 – 2016-05-27 (×6): 8 [IU] via SUBCUTANEOUS
  Administered 2016-05-28: 4 [IU] via SUBCUTANEOUS
  Administered 2016-05-28: 6 [IU] via SUBCUTANEOUS
  Administered 2016-05-28: 4 [IU] via SUBCUTANEOUS
  Administered 2016-05-28: 8 [IU] via SUBCUTANEOUS
  Administered 2016-05-29 (×3): 4 [IU] via SUBCUTANEOUS
  Administered 2016-05-29: 2 [IU] via SUBCUTANEOUS
  Administered 2016-05-30 (×2): 4 [IU] via SUBCUTANEOUS

## 2016-05-26 MED ORDER — ALUM & MAG HYDROXIDE-SIMETH 200-200-20 MG/5ML PO SUSP
15.0000 mL | ORAL | Status: DC | PRN
  Administered 2016-05-26: 15 mL via ORAL
  Filled 2016-05-26: qty 30

## 2016-05-26 MED ORDER — INSULIN DETEMIR 100 UNIT/ML ~~LOC~~ SOLN
36.0000 [IU] | Freq: Two times a day (BID) | SUBCUTANEOUS | Status: DC
  Administered 2016-05-26: 36 [IU] via SUBCUTANEOUS
  Filled 2016-05-26 (×4): qty 0.36

## 2016-05-26 MED ORDER — FUROSEMIDE 40 MG PO TABS
40.0000 mg | ORAL_TABLET | Freq: Two times a day (BID) | ORAL | Status: DC
  Administered 2016-05-27 – 2016-05-28 (×4): 40 mg via ORAL
  Filled 2016-05-26 (×4): qty 1

## 2016-05-26 NOTE — Progress Notes (Addendum)
[address]       [place] [address]             [phone]        CARDIOTHORACIC SURGERY PROGRESS NOTE   R2 Days Post-Op Procedure(s) (LRB): CORONARY ARTERY BYPASS GRAFTING (CABG) x four , using left internal mammary artery and left leg greater saphenous vein harvested endoscopically (N/A) TRANSESOPHAGEAL ECHOCARDIOGRAM ([name]) (N/A)  Subjective: Looks good but complains of a headache this morning.  Ambulated around SICU.  Otherwise feels well.  Objective: Vital signs: BP Readings from Last 1 Encounters:  05/26/16 106/60   Pulse Readings from Last 1 Encounters:  05/26/16 89   Resp Readings from Last 1 Encounters:  05/26/16 (!) 25   Temp Readings from Last 1 Encounters:  05/26/16 98.8 F (37.1 C) (Oral)    Hemodynamics:    Physical Exam:  Rhythm:   sinus  Breath sounds: clear  Heart sounds:  RRR  Incisions:  Dressing dry, intact  Abdomen:  Soft, non-distended, non-tender  Extremities:  Warm, well-perfused    Intake/Output from previous day: 09/08 0701 - 09/09 0700 In: 1018 [P.O.:640; I.V.:278; IV Piggyback:100] Out: 975 [Urine:975] Intake/Output this shift: Total I/O In: 200 [P.O.:200] Out: -   Lab Results:  CBC: Recent Labs  05/25/16 1700 05/25/16 1726 05/26/16 0447  WBC 8.4  --  9.9  HGB 9.7* 9.5* 9.2*  HCT 30.6* 28.0* 28.4*  PLT 152  --  141*    BMET:  Recent Labs  05/25/16 0359  05/25/16 1726 05/26/16 0447  NA 137  --  137 136  K 3.9  --  4.1 4.1  CL 106  --  104 104  CO2 24  --   --  26  GLUCOSE 148*  --  233* 218*  BUN 9  --  13 13  CREATININE 0.72  < > 0.80 0.86  CALCIUM 8.0*  --   --  8.4*  < > = values in this interval not displayed.   PT/INR:   Recent Labs  05/24/16 1436  LABPROT 15.8*  INR 1.25    CBG (last 3)   Recent Labs  05/25/16 2349 05/26/16 0408 05/26/16 0830  GLUCAP 235* 222* 251*    ABG    Component Value Date/Time   PHART 7.284 (L) 05/24/2016 1944   PCO2ART 48.6 (H)  05/24/2016 1944   PO2ART 75.0 (L) 05/24/2016 1944   HCO3 22.9 05/24/2016 1944   TCO2 25 05/25/2016 1726   ACIDBASEDEF 4.0 (H) 05/24/2016 1944   O2SAT 92.0 05/24/2016 1944    CXR: PORTABLE CHEST 1 VIEW  COMPARISON:  Radiograph of May 25, 2016.  FINDINGS: Stable cardiomegaly. Left-sided pacemaker is unchanged in position. Right lung is clear. Left basilar subsegmental atelectasis is noted with possible pleural effusion. Bony thorax is unremarkable. No pneumothorax is noted. Right internal jugular catheter has been removed.  IMPRESSION: Left basilar subsegmental atelectasis with possible pleural effusion.   Electronically Signed   By: Marijo Conception, M.D.   On: 05/26/2016 09:47   Assessment/Plan: S/P Procedure(s) (LRB): CORONARY ARTERY BYPASS GRAFTING (CABG) x four , using left internal mammary artery and left leg greater saphenous vein harvested endoscopically (N/A) TRANSESOPHAGEAL ECHOCARDIOGRAM ([name]) (N/A)  Doing well POD2 Maintaining NSR w/ stable BP Breathing comfortably w/ O2 sats 93-96% on 4 L/min Expected post op acute blood loss anemia, stable Expected post op atelectasis, mild Chronic diastolic CHF with expected post-op volume excess Type II diabetes mellitus, CBG's  trending up Morbid obesity OSA on CPAP   Mobilize  Diuresis  Increase levemir insulin and continue SSI with meal coverage  Restart insulin pump and home diabetes Rx once patient eating better  Transfer step down  Rexene Alberts, MD 05/26/2016 10:22 AM

## 2016-05-26 NOTE — Plan of Care (Signed)
Problem: Activity: Goal: Risk for activity intolerance will decrease Outcome: Progressing Out of bed yesterday to walk 3 times today    Problem: Bowel/Gastric: Goal: Gastrointestinal status for postoperative course will improve Outcome: Progressing Passing flatulence   Problem: Cardiac: Goal: Hemodynamic stability will improve Outcome: Progressing On no drips

## 2016-05-27 ENCOUNTER — Inpatient Hospital Stay (HOSPITAL_COMMUNITY): Payer: BLUE CROSS/BLUE SHIELD

## 2016-05-27 LAB — BASIC METABOLIC PANEL
Anion gap: 10 (ref 5–15)
BUN: 18 mg/dL (ref 6–20)
CO2: 23 mmol/L (ref 22–32)
Calcium: 8.6 mg/dL — ABNORMAL LOW (ref 8.9–10.3)
Chloride: 101 mmol/L (ref 101–111)
Creatinine, Ser: 0.85 mg/dL (ref 0.61–1.24)
GFR calc Af Amer: >60 mL/min (ref >60)
GFR calc non Af Amer: >60 mL/min (ref >60)
Glucose, Bld: 219 mg/dL — ABNORMAL HIGH (ref 65–99)
Potassium: 3.8 mmol/L (ref 3.5–5.1)
Sodium: 134 mmol/L — ABNORMAL LOW (ref 135–145)

## 2016-05-27 LAB — GLUCOSE, CAPILLARY
Glucose-Capillary: 221 mg/dL — ABNORMAL HIGH (ref 65–99)
Glucose-Capillary: 250 mg/dL — ABNORMAL HIGH (ref 65–99)
Glucose-Capillary: 259 mg/dL — ABNORMAL HIGH (ref 65–99)
Glucose-Capillary: 228 mg/dL — ABNORMAL HIGH (ref 65–99)

## 2016-05-27 LAB — CBC
HCT: 27.6 % — ABNORMAL LOW (ref 39.0–52.0)
Hemoglobin: 8.7 g/dL — ABNORMAL LOW (ref 13.0–17.0)
MCH: 30.5 pg (ref 26.0–34.0)
MCHC: 31.5 g/dL (ref 30.0–36.0)
MCV: 96.8 fL (ref 78.0–100.0)
Platelets: 147 10*3/uL — ABNORMAL LOW (ref 150–400)
RBC: 2.85 MIL/uL — ABNORMAL LOW (ref 4.22–5.81)
RDW: 14.6 % (ref 11.5–15.5)
WBC: 8.6 10*3/uL (ref 4.0–10.5)

## 2016-05-27 MED ORDER — POTASSIUM CHLORIDE CRYS ER 20 MEQ PO TBCR
20.0000 meq | EXTENDED_RELEASE_TABLET | Freq: Two times a day (BID) | ORAL | Status: DC
  Administered 2016-05-27 – 2016-05-28 (×4): 20 meq via ORAL
  Filled 2016-05-27 (×4): qty 1

## 2016-05-27 MED ORDER — INSULIN DETEMIR 100 UNIT/ML ~~LOC~~ SOLN
42.0000 [IU] | Freq: Two times a day (BID) | SUBCUTANEOUS | Status: DC
  Administered 2016-05-27 (×2): 42 [IU] via SUBCUTANEOUS
  Filled 2016-05-27 (×4): qty 0.42

## 2016-05-27 NOTE — Progress Notes (Addendum)
[address]       [place] [address]             [phone]      3 Days Post-Op Procedure(s) (LRB): CORONARY ARTERY BYPASS GRAFTING (CABG) x four , using left internal mammary artery and left leg greater saphenous vein harvested endoscopically (N/A) TRANSESOPHAGEAL ECHOCARDIOGRAM ([name]) (N/A) Subjective: Feels better, no specific complaints  Objective: Vital signs in last 24 hours: Temp:  [97.8 F (36.6 C)-98.8 F (37.1 C)] 97.8 F (36.6 C) (09/10 0415) Pulse Rate:  [81-95] 95 (09/10 0415) Cardiac Rhythm: Normal sinus rhythm (09/09 2110) Resp:  [14-25] 20 (09/10 0415) BP: (91-114)/(48-69) 112/61 (09/10 0415) SpO2:  [91 %-96 %] 91 % (09/10 0415) Weight:  [297 lb 6.4 oz (134.9 kg)] 297 lb 6.4 oz (134.9 kg) (09/10 0415)  Hemodynamic parameters for last 24 hours:    Intake/Output from previous day: 09/09 0701 - 09/10 0700 In: 340 [P.O.:340] Out: 1350 [Urine:1350] Intake/Output this shift: No intake/output data recorded.  General appearance: alert, cooperative and no distress Heart: regular rate and rhythm Lungs: mildly dim left base Abdomen: benign Extremities: + LE edema Wound: incis healing well  Lab Results:  Recent Labs  05/26/16 0447 05/27/16 0422  WBC 9.9 8.6  HGB 9.2* 8.7*  HCT 28.4* 27.6*  PLT 141* 147*   BMET:  Recent Labs  05/26/16 0447 05/27/16 0422  NA 136 134*  K 4.1 3.8  CL 104 101  CO2 26 23  GLUCOSE 218* 219*  BUN 13 18  CREATININE 0.86 0.85  CALCIUM 8.4* 8.6*    PT/INR:  Recent Labs  05/24/16 1436  LABPROT 15.8*  INR 1.25   ABG    Component Value Date/Time   PHART 7.284 (L) 05/24/2016 1944   HCO3 22.9 05/24/2016 1944   TCO2 25 05/25/2016 1726   ACIDBASEDEF 4.0 (H) 05/24/2016 1944   O2SAT 92.0 05/24/2016 1944   CBG (last 3)   Recent Labs  05/26/16 1711 05/26/16 2133 05/27/16 0739  GLUCAP 235* 224* 221*    Meds Scheduled Meds: . acetaminophen  1,000 mg Oral Q6H  . aspirin EC  325 mg  Oral Daily  . bisacodyl  10 mg Oral Daily   Or  . bisacodyl  10 mg Rectal Daily  . carvedilol  6.25 mg Oral BID WC  . docusate sodium  200 mg Oral Daily  . enoxaparin (LOVENOX) injection  40 mg Subcutaneous QHS  . ezetimibe  10 mg Oral Daily  . furosemide  40 mg Intravenous BID  . furosemide  40 mg Oral BID  . gemfibrozil  600 mg Oral Daily  . insulin aspart  0-24 Units Subcutaneous TID AC & HS  . insulin aspart  6 Units Subcutaneous TID WC  . insulin detemir  36 Units Subcutaneous BID  . mouth rinse  15 mL Mouth Rinse BID  . pantoprazole  40 mg Oral Daily  . potassium chloride  20 mEq Oral Daily  . rosuvastatin  40 mg Oral Daily  . sertraline  100 mg Oral Daily  . sodium chloride flush  3 mL Intravenous Q12H  . sodium chloride flush  3 mL Intravenous Q12H  . spironolactone  25 mg Oral Daily   Continuous Infusions: . sodium chloride 250 mL (05/25/16 0800)   PRN Meds:.sodium chloride, ALPRAZolam, alum & mag hydroxide-simeth, metoprolol, ondansetron (ZOFRAN) IV, oxyCODONE, sodium chloride flush, sodium chloride flush, traMADol  Xrays Dg Chest 2 View  Result Date: 05/27/2016  CLINICAL DATA:  CABG 4 days ago. EXAM: CHEST  2 VIEW COMPARISON:  05/26/2016 FINDINGS: 0558 hours. Low volume film. The cardio pericardial silhouette is enlarged. Interval improvement in lung aeration with some persistent patchy airspace disease right mid lung and linear probable atelectasis left base. IMPRESSION: Interval improvement in lung aeration. Electronically Signed   By: [name] M.D.   On: 05/27/2016 08:20   Dg Chest Port 1 View  Result Date: 05/26/2016 CLINICAL DATA:  Status post coronary artery bypass graft x4. EXAM: PORTABLE CHEST 1 VIEW COMPARISON:  Radiograph of May 25, 2016. FINDINGS: Stable cardiomegaly. Left-sided pacemaker is unchanged in position. Right lung is clear. Left basilar subsegmental atelectasis is noted with possible pleural effusion. Bony thorax is unremarkable. No  pneumothorax is noted. Right internal jugular catheter has been removed. IMPRESSION: Left basilar subsegmental atelectasis with possible pleural effusion. Electronically Signed   By: Marijo Conception, M.D.   On: 05/26/2016 09:47    Assessment/Plan: S/P Procedure(s) (LRB): CORONARY ARTERY BYPASS GRAFTING (CABG) x four , using left internal mammary artery and left leg greater saphenous vein harvested endoscopically (N/A) TRANSESOPHAGEAL ECHOCARDIOGRAM ([name]) (N/A)  1 doing well 2 sinus with some PVC's, cont current beta blocker, increase K+ replacement 3 wean O2, cont pulm toilet 4 cont diuresis with bID lasix for now 5 sugars elevated, increase detemir dose 6 on lovenox DVT prophylaxis 7 BP too low for ACE-I 8 H/H is stable 9 routine rehab  LOS: 3 days    GOLD,WAYNE E 05/27/2016  I have seen and examined the patient and agree with the assessment and plan as outlined.  Making good progress.   Rexene Alberts, MD 05/27/2016 11:17 AM

## 2016-05-28 LAB — GLUCOSE, CAPILLARY
Glucose-Capillary: 196 mg/dL — ABNORMAL HIGH (ref 65–99)
Glucose-Capillary: 215 mg/dL — ABNORMAL HIGH (ref 65–99)
Glucose-Capillary: 189 mg/dL — ABNORMAL HIGH (ref 65–99)
Glucose-Capillary: 175 mg/dL — ABNORMAL HIGH (ref 65–99)

## 2016-05-28 LAB — POCT I-STAT, CHEM 8
BUN: 7 mg/dL (ref 6–20)
Calcium, Ion: 1.11 mmol/L — ABNORMAL LOW (ref 1.15–1.40)
Chloride: 99 mmol/L — ABNORMAL LOW (ref 101–111)
Creatinine, Ser: 0.6 mg/dL — ABNORMAL LOW (ref 0.61–1.24)
Glucose, Bld: 139 mg/dL — ABNORMAL HIGH (ref 65–99)
HCT: 28 % — ABNORMAL LOW (ref 39.0–52.0)
Hemoglobin: 9.5 g/dL — ABNORMAL LOW (ref 13.0–17.0)
Potassium: 3.8 mmol/L (ref 3.5–5.1)
Sodium: 140 mmol/L (ref 135–145)
TCO2: 24 mmol/L (ref 0–100)

## 2016-05-28 MED ORDER — LACTULOSE 10 GM/15ML PO SOLN
30.0000 g | Freq: Once | ORAL | Status: AC
  Administered 2016-05-28: 30 g via ORAL
  Filled 2016-05-28: qty 45

## 2016-05-28 MED ORDER — GEMFIBROZIL 600 MG PO TABS
600.0000 mg | ORAL_TABLET | Freq: Every day | ORAL | Status: DC
  Administered 2016-05-28 – 2016-05-30 (×3): 600 mg via ORAL
  Filled 2016-05-28 (×3): qty 1

## 2016-05-28 MED ORDER — INSULIN DETEMIR 100 UNIT/ML ~~LOC~~ SOLN
44.0000 [IU] | Freq: Two times a day (BID) | SUBCUTANEOUS | Status: DC
  Administered 2016-05-28 – 2016-05-29 (×4): 44 [IU] via SUBCUTANEOUS
  Filled 2016-05-28 (×6): qty 0.44

## 2016-05-28 MED ORDER — EZETIMIBE 10 MG PO TABS
10.0000 mg | ORAL_TABLET | Freq: Every day | ORAL | Status: DC
  Administered 2016-05-28 – 2016-05-30 (×3): 10 mg via ORAL
  Filled 2016-05-28 (×3): qty 1

## 2016-05-28 MED ORDER — ROSUVASTATIN CALCIUM 10 MG PO TABS
40.0000 mg | ORAL_TABLET | Freq: Every day | ORAL | Status: DC
  Administered 2016-05-28 – 2016-05-30 (×3): 40 mg via ORAL
  Filled 2016-05-28 (×3): qty 4

## 2016-05-28 MED ORDER — SPIRONOLACTONE 25 MG PO TABS
25.0000 mg | ORAL_TABLET | Freq: Every day | ORAL | Status: DC
Start: 2016-05-28 — End: 2016-05-30
  Administered 2016-05-28 – 2016-05-30 (×3): 25 mg via ORAL
  Filled 2016-05-28 (×3): qty 1

## 2016-05-28 MED ORDER — SACUBITRIL-VALSARTAN 49-51 MG PO TABS
1.0000 | ORAL_TABLET | Freq: Two times a day (BID) | ORAL | Status: DC
  Administered 2016-05-28 – 2016-05-30 (×5): 1 via ORAL
  Filled 2016-05-28 (×5): qty 1

## 2016-05-28 MED ORDER — CARVEDILOL 12.5 MG PO TABS
12.5000 mg | ORAL_TABLET | Freq: Two times a day (BID) | ORAL | Status: DC
  Administered 2016-05-28 – 2016-05-30 (×4): 12.5 mg via ORAL
  Filled 2016-05-28 (×4): qty 1

## 2016-05-28 NOTE — Progress Notes (Signed)
CARDIAC REHAB PHASE I   PRE:  Rate/Rhythm: 95 SR with PVC    BP: sitting 100/64    SaO2: 90-91 RA  MODE:  Ambulation: 300 ft   POST:  Rate/Rhythm: 104 ST    BP: sitting (to BR)    SaO2: 87 RA, up to 91 RA with pursed lip breathing  Pt able to stand with min assist. Fairly steady with slow pace. C/o being uncomfortable due to swelling and SOB. Rest x2, SaO2 lower toward end, 87 RA. SaO2 up with pursed lip breathing. To BSC after walking. Encouraged x2 more walks. Pt sts he doesn't think he walked yesterday. [id number]  [place], ACSM 05/28/2016 10:07 AM

## 2016-05-28 NOTE — Progress Notes (Addendum)
      [address]       [place] [address]             [phone]        4 Days Post-Op Procedure(s) (LRB): CORONARY ARTERY BYPASS GRAFTING (CABG) x four , using left internal mammary artery and left leg greater saphenous vein harvested endoscopically (N/A) TRANSESOPHAGEAL ECHOCARDIOGRAM ([name]) (N/A)  Subjective: Patient sitting in chair about to eat breakfast. He has not had a bowel movement yet.  Objective: Vital signs in last 24 hours: Temp:  [98.2 F (36.8 C)-98.6 F (37 C)] 98.6 F (37 C) (09/11 0307) Pulse Rate:  [77-95] 95 (09/11 0307) Cardiac Rhythm: Normal sinus rhythm (09/10 2325) Resp:  [18] 18 (09/11 0307) BP: (104-132)/(66-82) 132/82 (09/11 0307) SpO2:  [91 %-98 %] 92 % (09/11 0307) Weight:  [287 lb (130.2 kg)] 287 lb (130.2 kg) (09/11 0307)  Pre op weight 133 kg Current Weight  05/28/16 287 lb (130.2 kg)      Intake/Output from previous day: 09/10 0701 - 09/11 0700 In: 480 [P.O.:480] Out: 750 [Urine:750]   Physical Exam:  Cardiovascular: RRR Pulmonary: Mostly clear to auscultation bilaterally Abdomen: Soft, non tender, bowel sounds present. Extremities: Mild bilateral lower extremity edema. Wounds: Clean and dry.  No erythema or signs of infection.  Lab Results: CBC: Recent Labs  05/26/16 0447 05/27/16 0422  WBC 9.9 8.6  HGB 9.2* 8.7*  HCT 28.4* 27.6*  PLT 141* 147*   BMET:  Recent Labs  05/26/16 0447 05/27/16 0422  NA 136 134*  K 4.1 3.8  CL 104 101  CO2 26 23  GLUCOSE 218* 219*  BUN 13 18  CREATININE 0.86 0.85  CALCIUM 8.4* 8.6*    PT/INR:  Lab Results  Component Value Date   INR 1.25 05/24/2016   INR 1.07 05/22/2016   INR 1.0 05/04/2016   ABG:  INR: Will add last result for INR, ABG once components are confirmed Will add last 4 CBG results once components are confirmed  Assessment/Plan:  1. CV - SR in the 80's. On Coreg 6.25 mg bid and Spironolactone 25 mg daily. 2.  Pulmonary - On room air.  Encourage incentive spirometer. 3. Volume Overload - On Lasix 40 mg bid. Will decrease Lasix to 40 mg daily after today. 4.  Acute blood loss anemia - Last H and H decreased to 8.7 and 27.6 5. Mild thrombocytopenia-last platelet count up to 147,000 6. DM-CBGs 259/228/196. On Insulin but will increase for better glucose control. Was on Farxiga pre op but hospital does not have. Will restart at discharge. Pre op HGA1C 9. Will need close medical follow up after discharge. 7. Remove EPW 8. LOC constipation 9. On Gemfibrizol and Zetia but no statin-will discuss with Dr. Cyndia Bent.  10. Possibly discharge in am  ZIMMERMAN,DONIELLE MPA-C 05/28/2016,7:22 AM  I have seen and examined the patient and agree with the assessment and plan as outlined.  Restart Entresto.  Patient states that he was on Crestor 40 mg/day at home - will restart.   He was using an insulin pump at home but apparently having problems with it.  Discussed diabetes management, the potential benefits of weight loss and regular exercise at length.   Rexene Alberts, MD 05/28/2016 8:47 AM

## 2016-05-28 NOTE — Progress Notes (Signed)
05/28/2016 12:39 PM EPW D/C'd per order and per protocol.  Ends intact. Pt. Tolerated well.  Advised bedrest x1hr.  Call bell in reach.  Vital signs collected per protocol. [place]

## 2016-05-29 LAB — GLUCOSE, CAPILLARY
Glucose-Capillary: 152 mg/dL — ABNORMAL HIGH (ref 65–99)
Glucose-Capillary: 172 mg/dL — ABNORMAL HIGH (ref 65–99)
Glucose-Capillary: 176 mg/dL — ABNORMAL HIGH (ref 65–99)
Glucose-Capillary: 181 mg/dL — ABNORMAL HIGH (ref 65–99)

## 2016-05-29 MED ORDER — FUROSEMIDE 40 MG PO TABS
40.0000 mg | ORAL_TABLET | Freq: Every day | ORAL | Status: DC
  Administered 2016-05-29 – 2016-05-30 (×2): 40 mg via ORAL
  Filled 2016-05-29 (×2): qty 1

## 2016-05-29 MED ORDER — POTASSIUM CHLORIDE CRYS ER 20 MEQ PO TBCR
20.0000 meq | EXTENDED_RELEASE_TABLET | Freq: Every day | ORAL | Status: DC
  Administered 2016-05-29 – 2016-05-30 (×2): 20 meq via ORAL
  Filled 2016-05-29 (×2): qty 1

## 2016-05-29 NOTE — Progress Notes (Signed)
Inpatient Diabetes Program Recommendations  AACE/ADA: New Consensus Statement on Inpatient Glycemic Control (2015)  Target Ranges:  Prepandial:   less than 140 mg/dL      Peak postprandial:   less than 180 mg/dL (1-2 hours)      Critically ill patients:  140 - 180 mg/dL   Lab Results  Component Value Date   GLUCAP 172 (H) 05/29/2016   HGBA1C 9.0 (H) 05/22/2016   Inpatient Diabetes Program Recommendations:   Spoke with pt @ length about A1C results and explained what an A1C is, basic pathophysiology of DM Type 2, basic home care, basic diabetes diet nutrition principles, importance of checking CBGs and maintaining good CBG control to prevent long-term and short-term complications. Reviewed signs and symptoms of hyperglycemia and hypoglycemia and how to treat hypoglycemia at home. Also reviewed blood sugar goals at home.  RNs to provide ongoing basic DM education at bedside with this patient. Have ordered educational booklet and DM videos. Have also placed RD consult for DM diet education for this patient. Patient states willingness to attend outpatient diabetes education post hospitalization in [place].  Thank you, Nani Gasser. Tamakia Porto, RN, MSN, CDE Inpatient Glycemic Control Team Team Pager [phone] (8am-5pm) 05/29/2016 12:33 PM

## 2016-05-29 NOTE — Progress Notes (Addendum)
      [address]       [place] [address]             [phone]        5 Days Post-Op Procedure(s) (LRB): CORONARY ARTERY BYPASS GRAFTING (CABG) x four , using left internal mammary artery and left leg greater saphenous vein harvested endoscopically (N/A) TRANSESOPHAGEAL ECHOCARDIOGRAM ([name]) (N/A)  Subjective: Patient sitting in chair about to eat breakfast. Had several bowel movements.  Objective: Vital signs in last 24 hours: Temp:  [98.2 F (36.8 C)-98.7 F (37.1 C)] 98.2 F (36.8 C) (09/12 0455) Pulse Rate:  [71-93] 71 (09/12 0455) Cardiac Rhythm: Normal sinus rhythm (09/11 1930) Resp:  [18] 18 (09/12 0455) BP: (97-121)/(57-76) 106/64 (09/12 0455) SpO2:  [95 %-96 %] 96 % (09/12 0455) Weight:  [281 lb 8 oz (127.7 kg)] 281 lb 8 oz (127.7 kg) (09/12 0455)  Pre op weight 133 kg Current Weight  05/29/16 281 lb 8 oz (127.7 kg)      Intake/Output from previous day: 09/11 0701 - 09/12 0700 In: 726 [P.O.:720; I.V.:6] Out: -    Physical Exam:  Cardiovascular: RRR Pulmonary: Mostly clear to auscultation bilaterally Abdomen: Soft, non tender, bowel sounds present. Extremities: Mild bilateral lower extremity edema. Wounds: Clean and dry.  No erythema or signs of infection.  Lab Results: CBC:  Recent Labs  05/27/16 0422  WBC 8.6  HGB 8.7*  HCT 27.6*  PLT 147*   BMET:   Recent Labs  05/27/16 0422  NA 134*  K 3.8  CL 101  CO2 23  GLUCOSE 219*  BUN 18  CREATININE 0.85  CALCIUM 8.6*    PT/INR:  Lab Results  Component Value Date   INR 1.25 05/24/2016   INR 1.07 05/22/2016   INR 1.0 05/04/2016   ABG:  INR: Will add last result for INR, ABG once components are confirmed Will add last 4 CBG results once components are confirmed  Assessment/Plan:  1. CV - SR in the 80's. On Coreg 12.5 mg bid, Spironolactone 25 mg daily, and Entresto po bid. SBP in the 90's to low 100's-monitor as may need to adjust medications. 2.  Pulmonary  - On room air. Encourage incentive spirometer. 3. Volume Overload - On Lasix 40 mg bid. Will decrease Lasix to 40 mg daily. 4.  Acute blood loss anemia - Last H and H decreased to 8.7 and 27.6 5. Mild thrombocytopenia-last platelet count up to 147,000 6. DM-CBGs 189/175/152. On Insulin. Was on Farxiga pre op but hospital does not have. Will restart at discharge. Pre op HGA1C 9. Will need close medical follow up after discharge. Will ask diabetes educator to help patient with diet etc. 7. Likely discharge in am  ZIMMERMAN,DONIELLE MPA-C 05/29/2016,7:25 AM   surgical incisions clean and dry Agree with above findings and plan to Spencer trigt MD

## 2016-05-29 NOTE — Progress Notes (Signed)
CARDIAC REHAB PHASE I   PRE:  Rate/Rhythm: 82 SR    BP: sitting 104/75    SaO2: 94 RA  MODE:  Ambulation: 500 ft   POST:  Rate/Rhythm: 93 SR    BP: sitting 100/57     SaO2: 90 RA  Pt moving independently with RW, also able to stand independently. Tires easily with ADLs in room, SOB. Increased distance today but very slow pace. To recliner. C/o being significantly cold after walking. [place], ACSM 05/29/2016 11:31 AM

## 2016-05-30 LAB — GLUCOSE, CAPILLARY
Glucose-Capillary: 166 mg/dL — ABNORMAL HIGH (ref 65–99)
Glucose-Capillary: 182 mg/dL — ABNORMAL HIGH (ref 65–99)

## 2016-05-30 MED ORDER — CARVEDILOL 12.5 MG PO TABS: 12.5000 mg | ORAL_TABLET | Freq: Two times a day (BID) | ORAL | 1 refills | Status: DC

## 2016-05-30 MED ORDER — ASPIRIN 325 MG PO TBEC: 325.0000 mg | DELAYED_RELEASE_TABLET | Freq: Every day | ORAL | 0 refills | Status: DC

## 2016-05-30 MED ORDER — OXYCODONE HCL 10 MG PO TABS: 10.0000 mg | ORAL_TABLET | ORAL | 0 refills | Status: DC | PRN

## 2016-05-30 NOTE — Progress Notes (Signed)
Spoke to the staff RN taking care of the patient regarding patient's discharge.  Since patient will probably be going home this afternoon, asked RN to remind patient to not start his insulin pump back until this evening after he gets home since he has had Levemir last night at 2100.  Will continue to monitor blood sugars while in the hospital. Harvel Ricks RN BSN CDE

## 2016-05-30 NOTE — Plan of Care (Signed)
Problem: Food- and Nutrition-Related Knowledge Deficit (NB-1.1) Goal: Nutrition education Formal process to instruct or train a patient/client in a skill or to impart knowledge to help patients/clients voluntarily manage or modify food choices and eating behavior to maintain or improve health. Outcome: Completed/Met Date Met: 05/30/16  RD consulted for nutrition education regarding diabetes.   Lab Results  Component Value Date   HGBA1C 9.0 (H) 05/22/2016    RD provided "Carbohydrate Counting for People with Diabetes" handout from the Academy of Nutrition and Dietetics. Discussed different food groups and their effects on blood sugar, emphasizing carbohydrate-containing foods. Provided list of carbohydrates and recommended serving sizes of common foods.  Discussed importance of controlled and consistent carbohydrate intake throughout the day. Provided examples of ways to balance meals/snacks and encouraged intake of high-fiber, whole grain complex carbohydrates. Teach back method used.  Expect good compliance.  Body mass index is 38.52 kg/m. Pt meets criteria for Obesity Class II based on current BMI.  Patient for discharge today.  Arthur Holms, RD, LDN Pager #: [phone] After-Hours Pager #: [phone]

## 2016-05-30 NOTE — Progress Notes (Addendum)
      [address]       [place] [address]             [phone]        6 Days Post-Op Procedure(s) (LRB): CORONARY ARTERY BYPASS GRAFTING (CABG) x four , using left internal mammary artery and left leg greater saphenous vein harvested endoscopically (N/A) TRANSESOPHAGEAL ECHOCARDIOGRAM ([name]) (N/A)  Subjective: Patient just waking up. He has no specific complaints this am.  Objective: Vital signs in last 24 hours: Temp:  [98.7 F (37.1 C)-98.8 F (37.1 C)] 98.8 F (37.1 C) (09/13 0540) Pulse Rate:  [74-85] 85 (09/13 0540) Cardiac Rhythm: Normal sinus rhythm (09/12 1940) Resp:  [18-19] 19 (09/13 0540) BP: (96-101)/(51-55) 101/51 (09/13 0540) SpO2:  [94 %-96 %] 94 % (09/13 0540) Weight:  [284 lb (128.8 kg)] 284 lb (128.8 kg) (09/13 0540)  Pre op weight 133 kg Current Weight  05/30/16 284 lb (128.8 kg)      Intake/Output from previous day: 09/12 0701 - 09/13 0700 In: 1523 [P.O.:1520; I.V.:3] Out: -    Physical Exam:  Cardiovascular: RRR Pulmonary: Mostly clear to auscultation bilaterally Abdomen: Soft, non tender, bowel sounds present. Extremities: Mild bilateral lower extremity edema. Wounds: Clean and dry.  No erythema or signs of infection.  Lab Results: CBC: No results for input(s): WBC, HGB, HCT, PLT in the last 72 hours. BMET:  No results for input(s): NA, K, CL, CO2, GLUCOSE, BUN, CREATININE, CALCIUM in the last 72 hours.  PT/INR:  Lab Results  Component Value Date   INR 1.25 05/24/2016   INR 1.07 05/22/2016   INR 1.0 05/04/2016   ABG:  INR: Will add last result for INR, ABG once components are confirmed Will add last 4 CBG results once components are confirmed  Assessment/Plan:  1. CV - SR in the 80's. On Coreg 12.5 mg bid, Spironolactone 25 mg daily, and Entresto po bid. SBP continues to be in the 90's to low 100's but asymptomatic. Patient was on Plavix pre op so will discuss with Dr. Cyndia Bent if needs to be restarted. 2.   Pulmonary - On room air. Encourage incentive spirometer. 3. Volume Overload - On Lasix 40 mg daily. Patient was on Lasix 40 mg daily prior to surgery and will continue. 4.  Acute blood loss anemia - Last H and H decreased to 8.7 and 27.6 5. Mild thrombocytopenia-Last platelet count up to 147,000 6. DM-CBGs 176/181/166. On Insulin. Was on Farxiga pre op but hospital does not have. Will restart at discharge. On Insulin pump prior to surgery-will so will resume. Pre op HGA1C 9. Will need close medical follow up after discharge. Appreciate diabetes education. Await dietary consult. 7. Discharge later today  ZIMMERMAN,DONIELLE MPA-C 05/30/2016,7:18 AM    Chart reviewed, patient examined, agree with above. He feels fine and is walking well. He says that he no longer has the burning chest pain that he had preop with ambulation. He is stable to go home today and I agree with the plan as noted above.

## 2016-05-30 NOTE — Progress Notes (Signed)
Ed completed with pt. Long discussion of smoking cessation (he is motivated to quit however it sounds like his family will continue to smoke, making it more difficult for him), diet modification, and ex. He is interested in [facility] and will send referral to [facility]. Set up d/c video however his sound is not working on his tv. Reinforced daily wts as well to avoid HF.  [id number] Yves Dill CES, ACSM 11:53 AM 05/30/2016

## 2016-05-30 NOTE — Care Management Note (Signed)
Case Management Note Marvetta Gibbons RN, BSN Unit 2W-Case Manager [phone]  Patient Details  Name: Brian Williams MRN: [id number] Date of Birth: Feb 10, 1961  Subjective/Objective:   Pt s/p CABG on 9/7-tx from ICU to 2W on 05/26/16 post op                 Action/Plan: PTA pt lived at home- plan to return home- no CM needs noted  Expected Discharge Date:    05/30/16              Expected Discharge Plan:  Home/Self Care  In-House Referral:     Discharge planning Services  CM Consult  Post Acute Care Choice:    Choice offered to:     DME Arranged:    DME Agency:     HH Arranged:    [place] Agency:     Status of Service:  Completed, signed off  If discussed at [place] of Stay Meetings, dates discussed:    Discharge Disposition: Home self care   Additional Comments:  Dawayne Patricia, RN 05/30/2016, 10:28 AM

## 2016-05-31 ENCOUNTER — Encounter: Payer: Self-pay | Admitting: Cardiovascular Disease

## 2016-06-01 ENCOUNTER — Ambulatory Visit: Payer: BLUE CROSS/BLUE SHIELD | Admitting: Cardiovascular Disease

## 2016-06-06 ENCOUNTER — Ambulatory Visit: Payer: BLUE CROSS/BLUE SHIELD | Admitting: Surgery

## 2016-06-07 ENCOUNTER — Ambulatory Visit (INDEPENDENT_AMBULATORY_CARE_PROVIDER_SITE_OTHER): Payer: Self-pay | Admitting: *Deleted

## 2016-06-07 DIAGNOSIS — Z4802 Encounter for removal of sutures: Secondary | ICD-10-CM

## 2016-06-07 DIAGNOSIS — I2510 Atherosclerotic heart disease of native coronary artery without angina pectoris: Secondary | ICD-10-CM

## 2016-06-07 DIAGNOSIS — Z951 Presence of aortocoronary bypass graft: Secondary | ICD-10-CM

## 2016-06-07 NOTE — Progress Notes (Signed)
Mr. Divan returns for suture removal from three previous chest tube sites. These sites, as well as, his sternal incision and right and left EVH incisions are all very well healed. Appetite is good.  His endocrinologist has had to adjust his insulin dosages via pump. Bowels are a little slow. He is using only two Oxycodone a day. He does request a refill which I will mail to him. He will see Dr. Rockey Situ next week. He will return as scheduled with a CXR.

## 2016-06-11 ENCOUNTER — Encounter: Payer: BLUE CROSS/BLUE SHIELD | Admitting: Nurse Practitioner

## 2016-06-13 ENCOUNTER — Encounter: Payer: Self-pay | Admitting: Family Medicine

## 2016-06-13 ENCOUNTER — Ambulatory Visit (INDEPENDENT_AMBULATORY_CARE_PROVIDER_SITE_OTHER): Payer: BLUE CROSS/BLUE SHIELD | Admitting: Family Medicine

## 2016-06-13 DIAGNOSIS — Z23 Encounter for immunization: Secondary | ICD-10-CM | POA: Diagnosis not present

## 2016-06-13 DIAGNOSIS — H6120 Impacted cerumen, unspecified ear: Secondary | ICD-10-CM | POA: Insufficient documentation

## 2016-06-13 DIAGNOSIS — H6123 Impacted cerumen, bilateral: Secondary | ICD-10-CM | POA: Diagnosis not present

## 2016-06-13 NOTE — Assessment & Plan Note (Signed)
Symptoms most likely related to cerumen impaction. Nursing irrigated his left ear and there was noted to be some mild wax left though TM visible and normal. Patient notes symptoms still persist despite this. He will monitor over the next week and if not improved he will keep his ENT visit. If improved he will cancel this visit. Return precautions in AVS.

## 2016-06-13 NOTE — Progress Notes (Signed)
  Tommi Rumps, MD Phone: [phone]  Brian Williams is a 55 y.o. male who presents today for same-day visit.  Patient notes for the last week or so his left ear has felt stopped up. He has no upper respiratory congestion or other respiratory symptoms. Notes some mild tinnitus with this. No dizziness. Notes he went to the walk-in clinic and they advised him that it was related to wax buildup. They rinsed it out and gave him some drops to use. He notes persistent symptoms since then.  ROS see history of present illness  Objective  Physical Exam Vitals:   06/13/16 1029  BP: (!) 110/58  Pulse: 78  Temp: 98.4 F (36.9 C)    BP Readings from Last 3 Encounters:  06/13/16 (!) 110/58  05/30/16 (!) 99/44  05/22/16 131/72   Wt Readings from Last 3 Encounters:  06/13/16 284 lb (128.8 kg)  05/30/16 284 lb (128.8 kg)  05/22/16 295 lb (133.8 kg)    Physical Exam  Constitutional: No distress.  HENT:  Head: Normocephalic and atraumatic.  Bilateral ears with cerumen noted  Cardiovascular: Normal rate, regular rhythm and normal heart sounds.   Pulmonary/Chest: Effort normal and breath sounds normal.  Neurological: He is alert. Gait normal.  Skin: He is not diaphoretic.     Assessment/Plan: Please see individual problem list.  Cerumen impaction Symptoms most likely related to cerumen impaction. Nursing irrigated his left ear and there was noted to be some mild wax left though TM visible and normal. Patient notes symptoms still persist despite this. He will monitor over the next week and if not improved he will keep his ENT visit. If improved he will cancel this visit. Return precautions in AVS.   Orders Placed This Encounter  Procedures  . Flu Vaccine QUAD 36+ mos IM    Tommi Rumps, MD [place]

## 2016-06-13 NOTE — Patient Instructions (Signed)
Nice to see you. Please monitor your symptoms. If they improve you can cancel your ear nose and throat visit next week. If they do not improve please keep this visit. If you get dizziness or any new symptoms with this please seek medical attention.

## 2016-06-14 ENCOUNTER — Other Ambulatory Visit: Payer: Self-pay | Admitting: *Deleted

## 2016-06-14 DIAGNOSIS — G8918 Other acute postprocedural pain: Secondary | ICD-10-CM

## 2016-06-14 MED ORDER — OXYCODONE HCL 10 MG PO TABS: 10.0000 mg | ORAL_TABLET | ORAL | 0 refills | Status: DC | PRN

## 2016-06-19 ENCOUNTER — Encounter: Payer: BLUE CROSS/BLUE SHIELD | Attending: Family Medicine | Admitting: Dietician

## 2016-06-19 ENCOUNTER — Encounter: Payer: Self-pay | Admitting: Dietician

## 2016-06-19 VITALS — BP 96/62 | Ht 72.0 in | Wt 284.0 lb

## 2016-06-19 DIAGNOSIS — I110 Hypertensive heart disease with heart failure: Secondary | ICD-10-CM | POA: Insufficient documentation

## 2016-06-19 DIAGNOSIS — Z95810 Presence of automatic (implantable) cardiac defibrillator: Secondary | ICD-10-CM | POA: Diagnosis not present

## 2016-06-19 DIAGNOSIS — E1142 Type 2 diabetes mellitus with diabetic polyneuropathy: Secondary | ICD-10-CM | POA: Diagnosis not present

## 2016-06-19 DIAGNOSIS — Z951 Presence of aortocoronary bypass graft: Secondary | ICD-10-CM | POA: Insufficient documentation

## 2016-06-19 DIAGNOSIS — E785 Hyperlipidemia, unspecified: Secondary | ICD-10-CM | POA: Diagnosis not present

## 2016-06-19 DIAGNOSIS — K219 Gastro-esophageal reflux disease without esophagitis: Secondary | ICD-10-CM | POA: Diagnosis not present

## 2016-06-19 DIAGNOSIS — I252 Old myocardial infarction: Secondary | ICD-10-CM | POA: Insufficient documentation

## 2016-06-19 DIAGNOSIS — Z87891 Personal history of nicotine dependence: Secondary | ICD-10-CM | POA: Diagnosis not present

## 2016-06-19 DIAGNOSIS — F329 Major depressive disorder, single episode, unspecified: Secondary | ICD-10-CM | POA: Diagnosis not present

## 2016-06-19 DIAGNOSIS — G4730 Sleep apnea, unspecified: Secondary | ICD-10-CM | POA: Insufficient documentation

## 2016-06-19 DIAGNOSIS — Z713 Dietary counseling and surveillance: Secondary | ICD-10-CM | POA: Diagnosis not present

## 2016-06-19 DIAGNOSIS — Z7902 Long term (current) use of antithrombotics/antiplatelets: Secondary | ICD-10-CM | POA: Insufficient documentation

## 2016-06-19 DIAGNOSIS — Z7982 Long term (current) use of aspirin: Secondary | ICD-10-CM | POA: Insufficient documentation

## 2016-06-19 DIAGNOSIS — E669 Obesity, unspecified: Secondary | ICD-10-CM | POA: Diagnosis not present

## 2016-06-19 DIAGNOSIS — Z79899 Other long term (current) drug therapy: Secondary | ICD-10-CM | POA: Diagnosis not present

## 2016-06-19 DIAGNOSIS — Z6838 Body mass index (BMI) 38.0-38.9, adult: Secondary | ICD-10-CM | POA: Insufficient documentation

## 2016-06-19 DIAGNOSIS — Z794 Long term (current) use of insulin: Secondary | ICD-10-CM | POA: Insufficient documentation

## 2016-06-19 DIAGNOSIS — I509 Heart failure, unspecified: Secondary | ICD-10-CM | POA: Insufficient documentation

## 2016-06-19 DIAGNOSIS — E1159 Type 2 diabetes mellitus with other circulatory complications: Secondary | ICD-10-CM

## 2016-06-19 DIAGNOSIS — F419 Anxiety disorder, unspecified: Secondary | ICD-10-CM | POA: Diagnosis not present

## 2016-06-19 DIAGNOSIS — E119 Type 2 diabetes mellitus without complications: Secondary | ICD-10-CM | POA: Diagnosis present

## 2016-06-19 NOTE — Patient Instructions (Addendum)
  Check blood sugars 4 x day before each meal and before bed every day Exercise:  Continue walking for    15  minutes   6  days a week if able Avoid sugar sweetened drinks (soda, tea, coffee, sports drinks, juices) Eat 4-5 carb servings/meal (60-75 carb grams) + protein Eat 1 carb serving/snack (15 carb grams) + protein Take meal bolus 20-30 min. prior to eating meal if able Avoid sweets and fried foods Avoid sweetened beverages Complete 3 Day Food Record and bring to next appt-estimate carb grams  Make a  dentist  doctor appointment Bring blood sugar records to the next appointment/class Carry fast acting glucose and a snack at all times Rotate injection sites Read 670G pump manual and practice button pushing prior to meeting with [employer] rep on 06-22-16 DO NOT switch to 670G pump until training done with [employer] rep  Return for appointment/classes on: 07-03-16 with dietitian

## 2016-06-19 NOTE — Progress Notes (Signed)
Diabetes Self-Management Education  Visit Type: First/Initial  Appt. Start Time: 1045 Appt. End Time: 1215  06/19/2016  [name], identified by name and date of birth, is a 55 y.o. male with a diagnosis of Diabetes: Type 2.   ASSESSMENT  Blood pressure 96/62, height 6' (1.829 m), weight 284 lb (128.8 kg). Body mass index is 38.52 kg/m.  Lacks knowledge of using Medtronic 670G insulin pump A1C 9.3% in 03-2016       Diabetes Self-Management Education - 06/19/16 1336      Visit Information   Visit Type First/Initial     Initial Visit   Diabetes Type Type 2     Health Coping   How would you rate your overall health? Fair     Psychosocial Assessment   Patient Belief/Attitude about Diabetes Motivated to manage diabetes   Self-care barriers None   Patient Concerns Weight Control;Glycemic Control;Healthy Lifestyle;Nutrition/Meal planning  prevent diabetes complications, become more fit   Special Needs None   Preferred Learning Style Hands on  reading   Learning Readiness Change in progress   What is the last grade level you completed in school? 12+     Pre-Education Assessment   Patient understands the diabetes disease and treatment process. Demonstrates understanding / competency   Patient understands incorporating nutritional management into lifestyle. Needs Review   Patient undertands incorporating physical activity into lifestyle. Needs Review   Patient understands using medications safely. Needs Review   Patient understands monitoring blood glucose, interpreting and using results Needs Review   Patient understands prevention, detection, and treatment of acute complications. Needs Review   Patient understands prevention, detection, and treatment of chronic complications. Needs Review   Patient understands how to develop strategies to address psychosocial issues. Needs Review   Patient understands how to develop strategies to promote health/change behavior. Needs  Review     Complications   How often do you check your blood sugar? 1-2 times/day  2-3x/day   Fasting Blood glucose range (mg/dL) 130-179  ac meals 140's-150's   Have you had a dilated eye exam in the past 12 months? No  11-2014-appt 06-22-16   Have you had a dental exam in the past 12 months? No  11-2014   Are you checking your feet? No     Dietary Intake   Breakfast --  eats 3 meals and 3 snacks/day-eats fried foods 4-5x/wk., desserts/sweets 2-3x/wk.   Snack (morning) --  eats snack foods 2-3x/wk.   Beverage(s) --  drinks water 2-3x/day, regular soda & sugar sweetened coffee 4-5x/day, diet drinks 4-5x/day     Exercise   Exercise Type Light (walking / raking leaves)   How many days per week to you exercise? 6   How many minutes per day do you exercise? 15   Total minutes per week of exercise 90     Patient Education   Previous Diabetes Education Yes (please comment)  02-2011   Nutrition management  Role of diet in the treatment of diabetes and the relationship between the three main macronutrients and blood glucose level;Food label reading, portion sizes and measuring food.;Carbohydrate counting;Meal timing in regards to the patients' current diabetes medication.  reviewed basic carbohydrate counting (pt carb counts using Animas insulin pump but admits he guesses at carb amounts at times)   Physical activity and exercise  Role of exercise on diabetes management, blood pressure control and cardiac health.  pt plans to start cardiac rehab soon (had CABG x4 05-24-16)   Medications Reviewed  patients medication for diabetes, action, purpose, timing of dose and side effects. Currently  uses Animas insulin pump with R500 insulin (pt not wearing pump-forgot to restart it this am after taking shower prior to appt)-takes meal bolus after meals at times-has new Medtronic 670g Insulin pump but has not been trained on it yet-pt anxious to be trained and started on 670G-to meet with Medtronic rep for  training 06-22-16 at MD office)-instructed pt on how to program basal rate and Wizard setup in 670G pump per MD note: ICR 1:5, ISF 40, target BG 120+/-10, basal rate 1.9-active insulin time already  at 6 hours in pump   Monitoring Purpose and frequency of SMBG.;Identified appropriate SMBG and/or A1C goals.;Yearly dilated eye exam;Taught/discussed recording of test results and interpretation of SMBG.;Taught/evaluated SMBG meter.  pt has Contour Link meter with new 670G medtronic insulin pump; pt reports had some low BG's (50's) after CABG in 05-2016 and Dr. Gabriel Carina reduced basal rate in pump which has helped prevent low BG's; FBG today 80-checked BG at end of appt [id number]   Acute complications Taught treatment of hypoglycemia - the 15 rule.   Chronic complications Relationship between chronic complications and blood glucose control;Identified and discussed with patient  current chronic complications;Dental care;Retinopathy and reason for yearly dilated eye exams   Personal strategies to promote health Helped patient develop diabetes management plan for (enter comment)      Individualized Plan for Diabetes Self-Management Training:   Learning Objective:  Patient will have a greater understanding of diabetes self-management. Patient education plan is to attend individual and/or group sessions per assessed needs and concerns.   Plan:   Patient Instructions   Check blood sugars 4 x day before each meal and before bed every day Exercise:  Continue walking for    15  minutes   6  days a week as able Avoid sugar sweetened drinks (soda, tea, coffee, sports drinks, juices) Eat 4-5 carb servings/meal (60-75 carb grams) + protein Eat 1 carb serving/snack (15 carb grams) + protein Take meal bolus 20-30 min. prior to eating meal if able Avoid sweets and fried foods-make healthy food choices Count carb grams as accurately as possible Avoid sweetened beverages Complete 3 Day Food Record and bring to next  appt-estimate carb grams  Make a  dentist  doctor appointment Bring blood sugar records to the next appointment/class Carry fast acting glucose and a snack at all times Rotate injection sites Read 670G pump manual and practice button pushing prior to meeting with Medtronic rep on 06-22-16 Do NOT start 6780G pump until training completed on 06-22-16 Return for appointment/classes on: 07-03-16 with dietitian   Expected Outcomes:    positive Education material provided: General meal planning Guidelines with carb counting review  If problems or questions, patient to contact team via:  [phone]  Future DSME appointment:  07-03-16

## 2016-06-20 ENCOUNTER — Other Ambulatory Visit: Payer: Self-pay | Admitting: *Deleted

## 2016-06-20 ENCOUNTER — Ambulatory Visit (INDEPENDENT_AMBULATORY_CARE_PROVIDER_SITE_OTHER): Payer: Self-pay | Admitting: Surgery

## 2016-06-20 VITALS — BP 107/71 | HR 80 | Resp 16 | Ht 72.0 in | Wt 284.0 lb

## 2016-06-20 DIAGNOSIS — I2510 Atherosclerotic heart disease of native coronary artery without angina pectoris: Secondary | ICD-10-CM

## 2016-06-20 DIAGNOSIS — T792XXA Traumatic secondary and recurrent hemorrhage and seroma, initial encounter: Secondary | ICD-10-CM

## 2016-06-20 DIAGNOSIS — Z951 Presence of aortocoronary bypass graft: Secondary | ICD-10-CM

## 2016-06-20 MED ORDER — CEPHALEXIN 500 MG PO CAPS: 500.0000 mg | ORAL_CAPSULE | Freq: Three times a day (TID) | ORAL | 0 refills | Status: DC

## 2016-06-21 ENCOUNTER — Encounter: Payer: Self-pay | Admitting: Cardiovascular Disease

## 2016-06-21 ENCOUNTER — Telehealth: Payer: Self-pay | Admitting: Cardiovascular Disease

## 2016-06-21 ENCOUNTER — Ambulatory Visit (INDEPENDENT_AMBULATORY_CARE_PROVIDER_SITE_OTHER): Payer: BLUE CROSS/BLUE SHIELD | Admitting: Cardiovascular Disease

## 2016-06-21 VITALS — BP 80/54 | HR 81 | Ht 72.0 in | Wt 281.2 lb

## 2016-06-21 DIAGNOSIS — Z794 Long term (current) use of insulin: Secondary | ICD-10-CM

## 2016-06-21 DIAGNOSIS — Z951 Presence of aortocoronary bypass graft: Secondary | ICD-10-CM | POA: Diagnosis not present

## 2016-06-21 DIAGNOSIS — Z95810 Presence of automatic (implantable) cardiac defibrillator: Secondary | ICD-10-CM

## 2016-06-21 DIAGNOSIS — I5022 Chronic systolic (congestive) heart failure: Secondary | ICD-10-CM

## 2016-06-21 DIAGNOSIS — E1165 Type 2 diabetes mellitus with hyperglycemia: Secondary | ICD-10-CM

## 2016-06-21 DIAGNOSIS — I25111 Atherosclerotic heart disease of native coronary artery with angina pectoris with documented spasm: Secondary | ICD-10-CM | POA: Diagnosis not present

## 2016-06-21 DIAGNOSIS — I420 Dilated cardiomyopathy: Secondary | ICD-10-CM

## 2016-06-21 DIAGNOSIS — I10 Essential (primary) hypertension: Secondary | ICD-10-CM | POA: Diagnosis not present

## 2016-06-21 DIAGNOSIS — E118 Type 2 diabetes mellitus with unspecified complications: Secondary | ICD-10-CM

## 2016-06-21 DIAGNOSIS — E7800 Pure hypercholesterolemia, unspecified: Secondary | ICD-10-CM

## 2016-06-21 DIAGNOSIS — IMO0002 Reserved for concepts with insufficient information to code with codable children: Secondary | ICD-10-CM

## 2016-06-21 MED ORDER — CLOPIDOGREL BISULFATE 75 MG PO TABS: 75.0000 mg | ORAL_TABLET | Freq: Every day | ORAL | 3 refills | Status: DC

## 2016-06-21 NOTE — Progress Notes (Signed)
Cardiology Office Note  Date:  06/21/2016   ID:  Ziya Vereb, DOB 30-Nov-1960, MRN [id number]  PCP:  [name], DO   Chief Complaint  Patient presents with  . OTHER    F/u CABG c/o soreness, fatigue, not feeling well today and sob. Meds reviewed verbally with pt.    HPI:  Mr. Gonsalez is a pleasant 55 year old gentleman with history of coronary artery disease, long history of smoking, diabetes type 2 poorly controlled with hemoglobin A1c of 9, followed by endocrine/Dr. Gabriel Carina, prior history of cardiac catheterization 12 dating back to 2002, ischemic cardiomyopathy, last echocardiogram March 2016 showing ejection fraction 20-25%, who presents for routine follow-up of his coronary artery disease, Follow-up after CABG, September 2017 History of sustained VT, VT ablation in [place] at [facility] of sleep apnea, on CPAP recent generator change in 2016 ICD in place,   previously seen by seen by [facility] cardiology heart failure/transplant team, [name].   He is approximately one month out from his CABG Had follow-up with Dr. Cyndia Bent yesterday Overall doing well, some mild discharge from the vein donor sites on right leg, started on Keflex Denies any significant shortness of breath, has otherwise been active. Reports that wife and 2 sons continue to smoke in the house around him He has not been smoking for least one months Denies any significant shortness of breath on exertion, no leg edema, currently taking regular doses of Lasix 40 mg daily No significant medication changes since he left the hospital. Reports that he was on Plavix "forever". Wonders why he is not back on this.   Lab work reviewed with him showing LDL 65, total chol 171 Previous hemoglobin A1c of 9, none since weight loss  He does report having 20 pound weight loss through the last month  Sugars running better Blood pressure also running lower. Typical baseline systolic pressure in the high 80s, 90s Blood  pressure today barely 80 systolic . He does report some lightheadedness when he stands up/orthostasis  Previous cardiac catheterization results  cath on 05/10/2016 showing a 70% eccentric distal LM stenosis, 80% proximal LAD in-stent restenosis, 80% diagonal stenosis, and tandem 90% proximal and mid RCA stenoses with mild in-stent narrowing. The LVEF is visually 35-45%  EKG shows normal sinus rhythm with rate 81 bpm, nonspecific ST  and T wave abnormality   other past medical history  Previously reported having memory issues  Feels he is forgetting things, losing attention, poor focus Wife has to continually her mind him about things, his own children have noticed he is forgetful For example he reports when he is driving, often goes wrong direction, forgets to take the turn that he is supposed to [facility] who to talk to about these new symptoms Reports his sleep is poor, sometimes sleeping well, most often sleeping very poorly, only several hours at a time  Denies having any significant change in his symptoms with decreased amiodarone down to 100 mg daily, denies worsening palpitations or tachycardia, no ICD shock/therapy  Previously seen in [place] for his cardiac issues. History dates back to 2002 when he had distal RCA disease. Several catheterizations over the next several years for in-stent restenosis of the distal RCA. Later cath, stent placed to the LAD. Most recent catheterization appears to be April 2011 showing patent LAD and RCA stent.  Despite this, he has had a decline in his ejection fraction over the past several years requiring defibrillator for ventricular tachycardia. Arrhythmia seems to be controlled on nadolol (  was previously on 80 mg twice a day, decreased down to daily secondary to low blood pressure and fatigue)  only had one shock since the device was placed.   seen by [facility] cardiology heart failure/transplant team, [name]. No medication changes at  that time. Creatinine was elevated with BUN, improved on subsequent lab work.   Previous cholesterol per his notes July 2015 showing total cholesterol 185, HDL 19, triglycerides 800, this was on Crestor 40, fenofibrate  PMH:   has a past medical history of AICD (automatic cardioverter/defibrillator) present; Anxiety; Arthritis; Back pain; Cardiomyopathy ([place]); CHF (congestive heart failure) ([place]); Coronary artery disease; Depression; Diabetes mellitus without complication ([place]); GERD (gastroesophageal reflux disease); Headache; History of bronchitis; History of colon polyps; History of hiatal hernia; Hyperlipidemia; Hypertension; MI (myocardial infarction) (2001); Obesity; Peripheral neuropathy ([place]); Pneumonia; PONV (postoperative nausea and vomiting); Presence of permanent cardiac pacemaker; Shortness of breath dyspnea; Sleep apnea; and Ventricular tachycardia ([place]).  PSH:    Past Surgical History:  Procedure Laterality Date  . BACK SURGERY    . CARDIAC CATHETERIZATION    . CARDIAC CATHETERIZATION Left 05/10/2016   Procedure: Left Heart Cath and Coronary Angiography;  Surgeon: Minna Merritts, MD;  Location: [facility];  Service: Cardiovascular;  Laterality: Left;  . CARDIAC DEFIBRILLATOR PLACEMENT  10/16/2007   ICD Model number 2207-36 serial number [id number]  . CARDIAC ELECTROPHYSIOLOGY STUDY AND ABLATION    . CHOLECYSTECTOMY  2001  . COLONOSCOPY    . CORONARY ANGIOPLASTY WITH STENT PLACEMENT     7 stents  . CORONARY ARTERY BYPASS GRAFT N/A 05/24/2016   Procedure: CORONARY ARTERY BYPASS GRAFTING (CABG) x four , using left internal mammary artery and left leg greater saphenous vein harvested endoscopically;  Surgeon: Gaye Pollack, MD;  Location: [facility];  Service: Open Heart Surgery;  Laterality: N/A;  . EP IMPLANTABLE DEVICE N/A 06/16/2015   Procedure: ICD Generator Changeout;  Surgeon: Deboraha Sprang, MD;  Location: [facility];  Service: Cardiovascular;  Laterality: N/A;  .  ESOPHAGOGASTRODUODENOSCOPY    . INSERT / REPLACE / REMOVE PACEMAKER    . KNEE SURGERY     bilateral knee   . [name] WITHOUT CARDIOVERSION N/A 05/24/2016   Procedure: TRANSESOPHAGEAL ECHOCARDIOGRAM ([name]);  Surgeon: Gaye Pollack, MD;  Location: [facility];  Service: Open Heart Surgery;  Laterality: N/A;  . VASECTOMY      Current Outpatient Prescriptions  Medication Sig Dispense Refill  . acetaminophen (TYLENOL) 500 MG tablet Take 1,000 mg by mouth every 6 (six) hours as needed.    . ALPRAZolam (XANAX) 1 MG tablet Take 1 tablet (1 mg total) by mouth 3 (three) times daily as needed. (Patient taking differently: Take 1 mg by mouth 3 (three) times daily as needed for anxiety. ) 90 tablet 2  . aspirin EC 81 MG tablet Take 2 tablets (162 mg total) by mouth daily. 90 tablet   . carvedilol (COREG) 12.5 MG tablet Take 1 tablet (12.5 mg total) by mouth 2 (two) times daily with a meal. 60 tablet 1  . cephALEXin (KEFLEX) 500 MG capsule Take 1 capsule (500 mg total) by mouth 3 (three) times daily. 21 capsule 0  . Cholecalciferol (VITAMIN D) 2000 units tablet Take 2,000 Units by mouth daily.    . dapagliflozin propanediol (FARXIGA) 5 MG TABS tablet Take 5 mg by mouth daily.     Marland Kitchen ezetimibe (ZETIA) 10 MG tablet Take 1 tablet (10 mg total) by mouth daily. 90 tablet 4  .  fenofibrate (TRICOR) 145 MG tablet Take 145 mg by mouth daily.     . furosemide (LASIX) 40 MG tablet Take 40 mg by mouth daily.     Marland Kitchen gemfibrozil (LOPID) 600 MG tablet Take 1 tablet (600 mg total) by mouth daily. 90 tablet 3  . insulin regular human CONCENTRATED (HUMULIN R) 500 UNIT/ML injection Continuous rate plus boluses on a sliding scale.    . Oxycodone HCl 10 MG TABS Take 1 tablet (10 mg total) by mouth every 4 (four) hours as needed. 30 tablet 0  . potassium chloride (KLOR-CON M10) 10 MEQ tablet Take 10 mEq by mouth daily.     . rosuvastatin (CRESTOR) 40 MG tablet Take 1 tablet (40 mg total) by mouth daily. 90 tablet 3  . sacubitril-valsartan  (ENTRESTO) 49-51 MG Take 1 tablet by mouth 2 (two) times daily. 60 tablet 6  . sertraline (ZOLOFT) 100 MG tablet TAKE 1 TABLET (100 MG TOTAL) BY MOUTH DAILY. 30 tablet 2  . spironolactone (ALDACTONE) 25 MG tablet Take 1 tablet (25 mg total) by mouth daily. 90 tablet 3  . clopidogrel (PLAVIX) 75 MG tablet Take 1 tablet (75 mg total) by mouth daily. 90 tablet 3   No current facility-administered medications for this visit.      Allergies:   Morphine and related and Naproxen   Social History:  The patient  reports that he has quit smoking. His smoking use included Cigarettes. He has a 12.50 pack-year smoking history. He has never used smokeless tobacco. He reports that he does not drink alcohol or use drugs.   Family History:   family history includes Heart attack (age of onset: 69) in his mother; Heart attack (age of onset: 98) in his father; Heart disease in his maternal grandmother and maternal uncle; Hypertension in his father, maternal uncle, and mother; Stroke in his maternal grandmother.    Review of Systems: Review of Systems  Constitutional: Positive for malaise/fatigue.  Respiratory: Negative.   Cardiovascular: Negative.        Chest discomfort at incision site  Gastrointestinal: Negative.   Musculoskeletal: Negative.        Localized leg swelling at incision site  Neurological: Negative.   Psychiatric/Behavioral: Negative.   All other systems reviewed and are negative.    PHYSICAL EXAM: VS:  BP (!) 80/54 (BP Location: Left Arm, Patient Position: Sitting, Cuff Size: Large)   Pulse 81   Ht 6' (1.829 m)   Wt 281 lb 4 oz (127.6 kg)   BMI 38.14 kg/m  , BMI Body mass index is 38.14 kg/m. GEN: Well nourished, well developed, in no acute distress , Obese  HEENT: normal  Neck: no JVD, carotid bruits, or masses Cardiac: RRR; no murmurs, rubs, or gallops,no edema . Will healed mediastinal scar  Respiratory:  clear to auscultation bilaterally, normal work of breathing GI:  soft, nontender, nondistended, + BS MS: no deformity or atrophy  Skin: warm and dry, no rash Neuro:  Strength and sensation are intact Psych: euthymic mood, full affect    Recent Labs: 05/22/2016: ALT 24 05/25/2016: Magnesium 2.5 05/27/2016: BUN 18; Creatinine, Ser 0.85; Hemoglobin 8.7; Platelets 147; Potassium 3.8; Sodium 134    Lipid Panel Lab Results  Component Value Date   CHOL 171 10/06/2015   HDL 20.50 (L) 10/06/2015   TRIG (H) 10/06/2015    574.0 Triglyceride is over 400; calculations on Lipids are invalid.      Wt Readings from Last 3 Encounters:  06/21/16 281 lb  4 oz (127.6 kg)  06/20/16 284 lb (128.8 kg)  06/19/16 284 lb (128.8 kg)       ASSESSMENT AND PLAN:  Coronary artery disease involving native coronary artery of native heart with angina pectoris with documented spasm ([place]) - Plan: EKG 12-Lead, AMB referral to cardiac rehabilitation Doing well following bypass surgery Order placed to start cardiac rehabilitation He has expressed an interest on restarting Plavix (" was on this forever") Certainly would be fine to restart Plavix with low-dose aspirin from a cardiac perspective  Congestive dilated cardiomyopathy ([place]) - Plan: EKG 12-Lead, AMB referral to cardiac rehabilitation Mild orthostasis on today's visit, systolic pressures 80 Recommended he hold Lasix today, decrease Coreg down to 6.25 mill grams twice a day Suggested he call our office if blood pressure continues to run low  Hx of CABG - Plan: AMB referral to cardiac rehabilitation Surgical records reviewed, he has done well in recovery On Keflex for mild discharge right lower extremity vein donor site He has follow-up with Dr. Cyndia Bent next week  Pure hypercholesterolemia Cholesterol is at goal on the current lipid regimen. No changes to the medications were made.  Chronic systolic heart failure (HCC) Reports feeling well, appears relatively euvolemic Recommended he monitor weight closely, try to  keep weight off  ICD (implantable cardioverter-defibrillator) in place Has follow-up with Dr. Caryl Comes  Uncontrolled type 2 diabetes mellitus with complication, with long-term current use of insulin ([place]) Stressed importance of aggressive diabetes control Much of this will come from diet changes Reports numbers are better with 20 pound weight loss recently   Total encounter time more than 25 minutes  Greater than 50% was spent in counseling and coordination of care with the patient  Disposition:   F/U  3 months   Orders Placed This Encounter  Procedures  . AMB referral to cardiac rehabilitation  . EKG 12-Lead     Signed, Esmond Plants, M.D., Ph.D. 06/21/2016  [place], [place]

## 2016-06-21 NOTE — Telephone Encounter (Signed)
Seen today and was rushed out. Patient wants to know if it is ok that he is still getting tired and getting sob on exertion.  Please call to discuss.

## 2016-06-21 NOTE — Telephone Encounter (Signed)
Spoke w/ pt.  Advised him that he is making good progress after his CABG x 4 and that fatigue & SOBOE are to be expected 4 weeks after procedure. He was referred to cardiac rehab today. Advised him that he will receive a call from them and that he will see an increase in his tolerance and stamina. He is appreciative of the call and verbalizes understanding.  Asked him to call back w/ any questions or concerns.

## 2016-06-21 NOTE — Patient Instructions (Addendum)
Medication Instructions:   Please cut the coreg in 1/2 twice a day Skip a dose of lasix  Monitor blood pressure If it runs low, call the office  We will send referral for cardiac rehab They will contact you w/ an appt  Labwork:  No new labs needed  Testing/Procedures:  No further testing at this time   Follow-Up: It was a pleasure seeing you in the office today. Please call us if you have new issues that need to be addressed before your next appt.  [phone]  Your physician wants you to follow-up in: 3 months.  You will receive a reminder letter in the mail two months in advance. If you don't receive a letter, please call our office to schedule the follow-up appointment.  If you need a refill on your cardiac medications before your next appointment, please call your pharmacy.

## 2016-06-22 LAB — HM DIABETES EYE EXAM

## 2016-06-25 ENCOUNTER — Encounter: Payer: Self-pay | Admitting: Surgery

## 2016-06-25 ENCOUNTER — Encounter: Payer: Self-pay | Admitting: Family Medicine

## 2016-06-25 ENCOUNTER — Encounter: Payer: Self-pay | Admitting: Dietician

## 2016-06-25 ENCOUNTER — Encounter: Payer: Self-pay | Admitting: Cardiovascular Disease

## 2016-06-25 NOTE — Progress Notes (Signed)
HPI: Patient returns for routine postoperative follow-up having undergone CABG x 4 on 05/24/2016. The patient's early postoperative recovery while in the hospital was notable for an uncomplicated postop course. Since hospital discharge the patient reports that he has been feeling well and walking without chest pain or dyspnea. His is watching his diet and slowly loosing weight. He has abstained from smoking. He does report some swelling beneath the right leg vein harvest incision medial to the knee. This began to get tender and red and then drained serous fluid yesterday with resolution of the swelling and pain. He denies fever or chills.  Current Outpatient Prescriptions  Medication Sig Dispense Refill  . acetaminophen (TYLENOL) 500 MG tablet Take 1,000 mg by mouth every 6 (six) hours as needed.    . ALPRAZolam (XANAX) 1 MG tablet Take 1 tablet (1 mg total) by mouth 3 (three) times daily as needed. (Patient taking differently: Take 1 mg by mouth 3 (three) times daily as needed for anxiety. ) 90 tablet 2  . carvedilol (COREG) 12.5 MG tablet Take 1 tablet (12.5 mg total) by mouth 2 (two) times daily with a meal. 60 tablet 1  . Cholecalciferol (VITAMIN D) 2000 units tablet Take 2,000 Units by mouth daily.    . dapagliflozin propanediol (FARXIGA) 5 MG TABS tablet Take 5 mg by mouth daily.     Marland Kitchen ezetimibe (ZETIA) 10 MG tablet Take 1 tablet (10 mg total) by mouth daily. 90 tablet 4  . fenofibrate (TRICOR) 145 MG tablet Take 145 mg by mouth daily.     . furosemide (LASIX) 40 MG tablet Take 40 mg by mouth daily.     Marland Kitchen gemfibrozil (LOPID) 600 MG tablet Take 1 tablet (600 mg total) by mouth daily. 90 tablet 3  . insulin regular human CONCENTRATED (HUMULIN R) 500 UNIT/ML injection Continuous rate plus boluses on a sliding scale.    . Oxycodone HCl 10 MG TABS Take 1 tablet (10 mg total) by mouth every 4 (four) hours as needed. 30 tablet 0  . potassium chloride (KLOR-CON M10) 10 MEQ tablet Take 10 mEq  by mouth daily.     . rosuvastatin (CRESTOR) 40 MG tablet Take 1 tablet (40 mg total) by mouth daily. 90 tablet 3  . sacubitril-valsartan (ENTRESTO) 49-51 MG Take 1 tablet by mouth 2 (two) times daily. 60 tablet 6  . sertraline (ZOLOFT) 100 MG tablet TAKE 1 TABLET (100 MG TOTAL) BY MOUTH DAILY. 30 tablet 2  . spironolactone (ALDACTONE) 25 MG tablet Take 1 tablet (25 mg total) by mouth daily. 90 tablet 3  . aspirin EC 81 MG tablet Take 2 tablets (162 mg total) by mouth daily. 90 tablet   . cephALEXin (KEFLEX) 500 MG capsule Take 1 capsule (500 mg total) by mouth 3 (three) times daily. 21 capsule 0  . clopidogrel (PLAVIX) 75 MG tablet Take 1 tablet (75 mg total) by mouth daily. 90 tablet 3   No current facility-administered medications for this visit.     Physical Exam: BP 107/71   Pulse 80   Resp 16   Ht 6' (1.829 m)   Wt 284 lb (128.8 kg)   SpO2 98% Comment: ON RA  BMI 38.52 kg/m  He looks well. Lung exam is clear. Cardiac exam shows a regular rate and rhythm with normal heart sounds. Chest incision is healing well and sternum is stable. The leg incisions are intact. The right leg vein harvest incision is intact with no significant  swelling and no redness or drainage. The left leg incision has a small seroma in the tunnel with no sign of infection.   Diagnostic Tests:  None today  Impression:  Overall I think he is doing well. He had a seroma beneath the right leg incision that has drained and there is no sign of infection. The incision is closed now and not draining further. I will put him on Keflex 500 mg tid for a week to try to prevent any infection of these incisions.  I encouraged him to continue walking. He is planning to participate in cardiac rehab. I told him he could drive his car but should not lift anything heavier than 10 lbs for three months postop.   Plan:  He has a follow up appt with me in a couple weeks with a CXR and will let me know if there is any  significant change for the worse in his leg incisions.   Gaye Pollack, MD [facility] [phone]

## 2016-06-25 NOTE — Progress Notes (Signed)
Called pt-pt reports he was trained on Medtronic 630G pump last wk. and is now wearing it and doing well with it.  Pt rescheduled his FU appt with dietitian on 07-20-16.

## 2016-06-26 ENCOUNTER — Ambulatory Visit (INDEPENDENT_AMBULATORY_CARE_PROVIDER_SITE_OTHER): Payer: BLUE CROSS/BLUE SHIELD | Admitting: *Deleted

## 2016-06-26 ENCOUNTER — Other Ambulatory Visit: Payer: Self-pay | Admitting: Surgery

## 2016-06-26 DIAGNOSIS — I5022 Chronic systolic (congestive) heart failure: Secondary | ICD-10-CM

## 2016-06-26 DIAGNOSIS — I255 Ischemic cardiomyopathy: Secondary | ICD-10-CM

## 2016-06-26 DIAGNOSIS — Z951 Presence of aortocoronary bypass graft: Secondary | ICD-10-CM

## 2016-06-26 NOTE — Progress Notes (Signed)
Remote ICD transmission.   

## 2016-06-27 ENCOUNTER — Ambulatory Visit
Admission: RE | Admit: 2016-06-27 | Discharge: 2016-06-27 | Disposition: A | Discharge status: Home / Self Care | Payer: BLUE CROSS/BLUE SHIELD | Source: Ambulatory Visit | Attending: Surgery | Admitting: Surgery

## 2016-06-27 ENCOUNTER — Encounter: Payer: Self-pay | Admitting: Surgery

## 2016-06-27 ENCOUNTER — Ambulatory Visit (INDEPENDENT_AMBULATORY_CARE_PROVIDER_SITE_OTHER): Payer: Self-pay | Admitting: Surgery

## 2016-06-27 ENCOUNTER — Encounter: Payer: Self-pay | Admitting: Cardiology

## 2016-06-27 VITALS — BP 100/62 | HR 84 | Resp 16 | Ht 72.0 in | Wt 281.0 lb

## 2016-06-27 DIAGNOSIS — Z951 Presence of aortocoronary bypass graft: Secondary | ICD-10-CM

## 2016-06-27 DIAGNOSIS — I2510 Atherosclerotic heart disease of native coronary artery without angina pectoris: Secondary | ICD-10-CM

## 2016-06-27 NOTE — Progress Notes (Signed)
HPI:  Patient returns for routine postoperative follow-up having undergone CABG x 4 on 05/24/2016. The patient's early postoperative recovery while in the hospital was notable for an uncomplicated postop course. Since hospital discharge the patient reports that he has been feeling well and walking without chest pain or dyspnea. When I saw him on 10/4 he had some swelling beneath the right leg vein harvest incision medial to the left knee and reported some redness and swelling until it opened and drained spontaneously. When I saw him the redness had resolved and there was no drainage. He was put on Keflex for a week and has two more days. He feels well and is mainly concerned about getting back on Plavix and decreasing his ASA to 81 mg daily.  Current Outpatient Prescriptions  Medication Sig Dispense Refill  . acetaminophen (TYLENOL) 500 MG tablet Take 1,000 mg by mouth every 6 (six) hours as needed.    . ALPRAZolam (XANAX) 1 MG tablet Take 1 tablet (1 mg total) by mouth 3 (three) times daily as needed. (Patient taking differently: Take 1 mg by mouth 3 (three) times daily as needed for anxiety. ) 90 tablet 2  . aspirin EC 81 MG tablet Take 2 tablets (162 mg total) by mouth daily. 90 tablet   . carvedilol (COREG) 12.5 MG tablet Take 1 tablet (12.5 mg total) by mouth 2 (two) times daily with a meal. 60 tablet 1  . cephALEXin (KEFLEX) 500 MG capsule Take 1 capsule (500 mg total) by mouth 3 (three) times daily. 21 capsule 0  . Cholecalciferol (VITAMIN D) 2000 units tablet Take 2,000 Units by mouth daily.    . clopidogrel (PLAVIX) 75 MG tablet Take 1 tablet (75 mg total) by mouth daily. 90 tablet 3  . dapagliflozin propanediol (FARXIGA) 5 MG TABS tablet Take 5 mg by mouth daily.     Marland Kitchen ezetimibe (ZETIA) 10 MG tablet Take 1 tablet (10 mg total) by mouth daily. 90 tablet 4  . fenofibrate (TRICOR) 145 MG tablet Take 145 mg by mouth daily.     . furosemide (LASIX) 40 MG tablet Take 40 mg by mouth daily.      Marland Kitchen gemfibrozil (LOPID) 600 MG tablet Take 1 tablet (600 mg total) by mouth daily. 90 tablet 3  . insulin regular human CONCENTRATED (HUMULIN R) 500 UNIT/ML injection Continuous rate plus boluses on a sliding scale.    . Oxycodone HCl 10 MG TABS Take 1 tablet (10 mg total) by mouth every 4 (four) hours as needed. 30 tablet 0  . potassium chloride (KLOR-CON M10) 10 MEQ tablet Take 10 mEq by mouth daily.     . rosuvastatin (CRESTOR) 40 MG tablet Take 1 tablet (40 mg total) by mouth daily. 90 tablet 3  . sacubitril-valsartan (ENTRESTO) 49-51 MG Take 1 tablet by mouth 2 (two) times daily. 60 tablet 6  . sertraline (ZOLOFT) 100 MG tablet TAKE 1 TABLET (100 MG TOTAL) BY MOUTH DAILY. 30 tablet 2  . spironolactone (ALDACTONE) 25 MG tablet Take 1 tablet (25 mg total) by mouth daily. 90 tablet 3   No current facility-administered medications for this visit.      Physical Exam: BP 100/62   Pulse 84   Resp 16   Ht 6' (1.829 m)   Wt 281 lb (127.5 kg)   SpO2 98% Comment: ON RA  BMI 38.11 kg/m  He looks well. Lung exam is clear. Cardiac exam shows a regular rate and rhythm with normal heart  sounds. Chest incision is healing well and sternum is stable. The leg incisions are intact. The right leg vein harvest incision is intact with no significant swelling mild redness around the incision but no drainage. The left leg incision has a small seroma in the tunnel with no sign of infection.   Diagnostic Tests:  CLINICAL DATA:  S/p CABG 05/24/2016; chest soreness at incision since procedure; some SOB with exertion; hx CHF, diabetic, pacemaker insertion x 8 years ago  EXAM: CHEST  2 VIEW  COMPARISON:  05/27/2016  FINDINGS: Stable changes from previous cardiac surgery. The left anterior chest wall AICD is well positioned and also stable. Cardiac silhouette is top-normal in size. No mediastinal or hilar masses or evidence of adenopathy.  Clear lungs.  No pleural effusion or  pneumothorax.  Skeletal structures are unremarkable.  IMPRESSION: No acute cardiopulmonary disease.   Electronically Signed   By: Lajean Manes M.D.   On: 06/27/2016 11:10   Impression:  Overall I think he is doing well. I encouraged him to continue walking. He is planning to participate in cardiac rehab. He is already driving his car but should not lift anything heavier than 10 lbs for three months postop. He will finish his Keflex and observe the leg incision. The right leg incision has slight redness around it which he did not have last week but there is no drainage. This could certainly flare up once the antibiotics are completed and then will require drainage. I think it is fine for him to resume Plavix and decrease his aspirin dose. He will discuss this with Dr. Rockey Situ.    Plan:  He will continue follow up with Dr. Rockey Situ and will contact me if his leg incisions get more red, swollen, painful or drain any fluid.    Gaye Pollack, MD [facility] [phone]

## 2016-06-28 ENCOUNTER — Telehealth: Payer: Self-pay | Admitting: Cardiovascular Disease

## 2016-06-28 MED ORDER — SACUBITRIL-VALSARTAN 49-51 MG PO TABS: 1.0000 | ORAL_TABLET | Freq: Two times a day (BID) | ORAL | 6 refills | Status: DC

## 2016-06-28 NOTE — Telephone Encounter (Signed)
He should not lift anything heavier than 10 lbs for three months postop.   There is a possibility of recurrent infection in the leg that might require additional ABX, possibly even require drainage  resume Plavix and decrease his aspirin dose to 81 mg once his leg has healed and no residual infection

## 2016-06-28 NOTE — Telephone Encounter (Signed)
Pt calling stating he saw Dr Cyndia Bent yesterday He did not have a problem of patient changing him to Plavix. Just calling us to see if this okay He is scheduled to go on a cruise next week would like to know if it is okay to go on this.  Also would like to know if it  okay for patient so he can be intimate with his wife. Pt also states he Ne[facility] [address]   Please advise

## 2016-06-28 NOTE — Telephone Encounter (Addendum)
Spoke w/ Brian Williams. Advised him of Dr. Donivan Scull MyChart message. Brian Williams states that he just opened this message as I was calling him.  He reports that Dr. Cyndia Bent advised him that it was ok to go on his cruise and be intimate w/ his wife, but Dr. Rockey Situ is hesitant to ok either of these. He states that Dr. Cyndia Bent is his surgeon and Dr. Rockey Situ is his cardiologist, so he is unsure as to who is "in charge".  He states that he is confused, as both docs defer decisions to each other and he would like for the 2 of you to talk to each other. He would like to know if he should be on Plavix, as this has not been decided yet. He asks if he should facilitate a meeting b/t his docs. Advised him that I will make Dr. Rockey Situ and Dr. Cyndia Bent aware of his concerns and will call him back w/ any recommendations.  He is appreciative of the call.   30 day supply of Entresto sent in, as Brian Williams has a 30 day coupon.

## 2016-06-29 ENCOUNTER — Other Ambulatory Visit: Payer: Self-pay

## 2016-06-29 ENCOUNTER — Other Ambulatory Visit
Admission: RE | Admit: 2016-06-29 | Discharge: 2016-06-29 | Disposition: A | Discharge status: Home / Self Care | Payer: BLUE CROSS/BLUE SHIELD | Source: Ambulatory Visit | Attending: Cardiovascular Disease | Admitting: Cardiovascular Disease

## 2016-06-29 ENCOUNTER — Ambulatory Visit (INDEPENDENT_AMBULATORY_CARE_PROVIDER_SITE_OTHER): Payer: BLUE CROSS/BLUE SHIELD

## 2016-06-29 VITALS — BP 108/64 | Ht 72.0 in | Wt 280.5 lb

## 2016-06-29 DIAGNOSIS — R079 Chest pain, unspecified: Secondary | ICD-10-CM

## 2016-06-29 DIAGNOSIS — Z951 Presence of aortocoronary bypass graft: Secondary | ICD-10-CM | POA: Diagnosis not present

## 2016-06-29 LAB — TROPONIN I: Troponin I: <0.03 ng/mL (ref <0.03)

## 2016-06-29 NOTE — Telephone Encounter (Signed)
Pt calling stating he was out doing some errands yesterday And stated to feel the symptoms like he had before surgery Little pressure in chest, SOB and burning in the throat.  Was just walking less than a block when this happened.  Would like to know if that is normal process of recovery  Please advise.

## 2016-06-29 NOTE — Telephone Encounter (Signed)
Spoke w/ pt.  He reports that he was at [employer], crossed over 2 streets and walked in front of 3 store fronts when he developed SOB, chest heaviness & burning in his throat, sx similar to prior to his CABG.   Pt reports that he has been able to walk distances prior to this w/o difficulty. Pt reports that sx resolved on their own after he came home and sat down.   He left a message w/ Dr. Vivi Martens office a few mins ago, as well.    Pt is agreeable to coming over this afternoon for EKG and stat troponin. Call transferred to scheduling to set up nurse visit.

## 2016-06-29 NOTE — Progress Notes (Signed)
1.) Reason for visit: EKG  2.) Name of MD requesting visit: Dr. Rockey Situ  3.) H&P: Pt s/p CABG 05/24/16.  4.) ROS related to problem: Pt reports that he was at [employer] yesterday, crossed over 2 streets at the round-about & walked the distance of 3 store fronts when he developed sudden SOB, chest heaviness & burning in his throat, sx similar to prior to his CABG.  Sx resolved after he went home and sat down. He was previously able to walk around [place] and inside his home w/ no problem.  He voices frustration throughout the visit that he is "always in a doctor's office".  He again requests permission to go on a cruise next month, as it is his 41th wedding anniversary and his wife is pressuring him to go.  Reiterated Dr. Donivan Scull concerns about him travelling, esp in light of the sx that brought him in today.  Sent pt to [facility] for STAT troponin.  Pt inquires as to why we are performing this test, as "that's to see if I've had a heart attack - I haven't had a heart attack".  Advised pt that I will call him as soon as I receive results back.  5.) Assessment and plan per MD:   EKG placed on Dr. Donivan Scull desk for review.   Left message on pt's vm that troponin levels are WNL. Asked him to call back if sx recur.

## 2016-07-02 ENCOUNTER — Telehealth: Payer: Self-pay

## 2016-07-02 NOTE — Telephone Encounter (Signed)
Prior Authorization has been approved for the Entresto by [email or web address]

## 2016-07-02 NOTE — Telephone Encounter (Signed)
Waiting for approval on a Prior Authorization sent for Entresto 49-51 mg take one tablet twice a day.  Prior Authorization was sent through [email or web address].

## 2016-07-02 NOTE — Telephone Encounter (Signed)
[facility] Delene Loll has been approved.

## 2016-07-03 ENCOUNTER — Ambulatory Visit: Payer: BLUE CROSS/BLUE SHIELD | Admitting: Dietician

## 2016-07-04 ENCOUNTER — Other Ambulatory Visit: Payer: Self-pay

## 2016-07-04 DIAGNOSIS — T792XXA Traumatic secondary and recurrent hemorrhage and seroma, initial encounter: Secondary | ICD-10-CM

## 2016-07-04 DIAGNOSIS — G8918 Other acute postprocedural pain: Secondary | ICD-10-CM

## 2016-07-04 MED ORDER — OXYCODONE HCL 10 MG PO TABS: 10.0000 mg | ORAL_TABLET | ORAL | 0 refills | Status: DC | PRN

## 2016-07-04 NOTE — Telephone Encounter (Signed)
RX refill for OxyCodone 10 mg printed out. Patient will pick up later today.

## 2016-07-16 ENCOUNTER — Encounter: Payer: BLUE CROSS/BLUE SHIELD | Admitting: *Deleted

## 2016-07-16 VITALS — Ht 71.9 in | Wt 277.7 lb

## 2016-07-16 DIAGNOSIS — E1142 Type 2 diabetes mellitus with diabetic polyneuropathy: Secondary | ICD-10-CM | POA: Diagnosis not present

## 2016-07-16 DIAGNOSIS — Z951 Presence of aortocoronary bypass graft: Secondary | ICD-10-CM

## 2016-07-16 NOTE — Patient Instructions (Signed)
Patient Instructions  Patient Details  Name: Brian Williams MRN: [id number] Date of Birth: Feb 13, 1961 Referring Provider:  Minna Merritts, MD  Below are the personal goals you chose as well as exercise and nutrition goals. Our goal is to help you keep on track towards obtaining and maintaining your goals. We will be discussing your progress on these goals with you throughout the program.  Initial Exercise Prescription:     Initial Exercise Prescription - 07/16/16 1500      Date of Initial Exercise RX and Referring Provider   Date 07/16/16   Referring Provider Ida Rogue [facility]     Treadmill   MPH 2   Grade 1   Minutes 15   METs 2.81     Recumbant Elliptical   Level 2   RPM 50   Minutes 15   METs 2     T5 Nustep   Level 2   Watts --  80-100 spm   Minutes 15   METs 2     Prescription Details   Frequency (times per week) 2   Duration Progress to 45 minutes of aerobic exercise without signs/symptoms of physical distress     Intensity   THRR 40-80% of Max Heartrate 118-149   Ratings of Perceived Exertion 11-15   Perceived Dyspnea 0-4     Progression   Progression Continue to progress workloads to maintain intensity without signs/symptoms of physical distress.     Resistance Training   Training Prescription Yes   Weight 3 lbs   Reps 10-12      Exercise Goals: Frequency: Be able to perform aerobic exercise three times per week working toward 3-5 days per week.  Intensity: Work with a perceived exertion of 11 (fairly light) - 15 (hard) as tolerated. Follow your new exercise prescription and watch for changes in prescription as you progress with the program. Changes will be reviewed with you when they are made.  Duration: You should be able to do 30 minutes of continuous aerobic exercise in addition to a 5 minute warm-up and a 5 minute cool-down routine.  Nutrition Goals: Your personal nutrition goals will be established when you do your nutrition analysis with  the dietician.  The following are nutrition guidelines to follow: Cholesterol < 200mg /day Sodium < 1500mg /day Fiber: Men over 50 yrs - 30 grams per day  Personal Goals:     Personal Goals and Risk Factors at Admission - 07/16/16 1415      Core Components/Risk Factors/Patient Goals on Admission    Weight Management Obesity;Yes   Intervention Weight Management/Obesity: Establish reasonable short term and long term weight goals.;Weight Management: Provide education and appropriate resources to help participant work on and attain dietary goals.;Weight Management: Develop a combined nutrition and exercise program designed to reach desired caloric intake, while maintaining appropriate intake of nutrient and fiber, sodium and fats, and appropriate energy expenditure required for the weight goal.;Obesity: Provide education and appropriate resources to help participant work on and attain dietary goals.   Admit Weight 277 lb (125.6 kg)   Goal Weight: Short Term 274 lb (124.3 kg)   Goal Weight: Long Term 199 lb (90.3 kg)   Expected Outcomes Short Term: Continue to assess and modify interventions until short term weight is achieved;Long Term: Adherence to nutrition and physical activity/exercise program aimed toward attainment of established weight goal;Weight Loss: Understanding of general recommendations for a balanced deficit meal plan, which promotes 1-2 lb weight loss per week and includes a negative energy balance  of [id number] kcal/d   Sedentary Yes   Intervention Provide advice, education, support and counseling about physical activity/exercise needs.;Develop an individualized exercise prescription for aerobic and resistive training based on initial evaluation findings, risk stratification, comorbidities and participant's personal goals.   Expected Outcomes Achievement of increased cardiorespiratory fitness and enhanced flexibility, muscular endurance and strength shown through measurements of  functional capacity and personal statement of participant.   Increase Strength and Stamina Yes   Intervention Provide advice, education, support and counseling about physical activity/exercise needs.;Develop an individualized exercise prescription for aerobic and resistive training based on initial evaluation findings, risk stratification, comorbidities and participant's personal goals.   Expected Outcomes Achievement of increased cardiorespiratory fitness and enhanced flexibility, muscular endurance and strength shown through measurements of functional capacity and personal statement of participant.   Tobacco Cessation Yes   Number of packs per day quit 05/24/2016   Lives with 3 smokers   Intervention Assist the participant in steps to quit. Provide individualized education and counseling about committing to Tobacco Cessation, relapse prevention, and pharmacological support that can be provided by physician.   Expected Outcomes Long Term: Complete abstinence from all tobacco products for at least 12 months from quit date.   Diabetes Yes   Intervention Provide education about signs/symptoms and action to take for hypo/hyperglycemia.;Provide education about proper nutrition, including hydration, and aerobic/resistive exercise prescription along with prescribed medications to achieve blood glucose in normal ranges: Fasting glucose 65-99 mg/dL   Expected Outcomes Short Term: Participant verbalizes understanding of the signs/symptoms and immediate care of hyper/hypoglycemia, proper foot care and importance of medication, aerobic/resistive exercise and nutrition plan for blood glucose control.;Long Term: Attainment of HbA1C < 7%.   Heart Failure Yes   Intervention Provide a combined exercise and nutrition program that is supplemented with education, support and counseling about heart failure. Directed toward relieving symptoms such as shortness of breath, decreased exercise tolerance, and extremity edema.    Expected Outcomes Improve functional capacity of life;Short term: Attendance in program 2-3 days a week with increased exercise capacity. Reported lower sodium intake. Reported increased fruit and vegetable intake. Reports medication compliance.;Short term: Daily weights obtained and reported for increase. Utilizing diuretic protocols set by physician.;Long term: Adoption of self-care skills and reduction of barriers for early signs and symptoms recognition and intervention leading to self-care maintenance.   Hypertension Yes   Intervention Provide education on lifestyle modifcations including regular physical activity/exercise, weight management, moderate sodium restriction and increased consumption of fresh fruit, vegetables, and low fat dairy, alcohol moderation, and smoking cessation.;Monitor prescription use compliance.   Expected Outcomes Short Term: Continued assessment and intervention until BP is < 140/21mm HG in hypertensive participants. < 130/11mm HG in hypertensive participants with diabetes, heart failure or chronic kidney disease.;Long Term: Maintenance of blood pressure at goal levels.   Lipids Yes   Intervention Provide education and support for participant on nutrition & aerobic/resistive exercise along with prescribed medications to achieve LDL 70mg , HDL >40mg .   Expected Outcomes Short Term: Participant states understanding of desired cholesterol values and is compliant with medications prescribed. Participant is following exercise prescription and nutrition guidelines.;Long Term: Cholesterol controlled with medications as prescribed, with individualized exercise RX and with personalized nutrition plan. Value goals: LDL < 70mg , HDL > 40 mg.   Stress Yes  Lives with family that continues to smoke around him . He just Quit tobacco products.  Finaincial stress, and family stress:has 2 sons over 57 years old still living at home.   Intervention  Offer individual and/or small group education  and counseling on adjustment to heart disease, stress management and health-related lifestyle change. Teach and support self-help strategies.;Refer participants experiencing significant psychosocial distress to appropriate mental health specialists for further evaluation and treatment. When possible, include family members and significant others in education/counseling sessions.   Expected Outcomes Short Term: Participant demonstrates changes in health-related behavior, relaxation and other stress management skills, ability to obtain effective social support, and compliance with psychotropic medications if prescribed.;Long Term: Emotional wellbeing is indicated by absence of clinically significant psychosocial distress or social isolation.      Tobacco Use Initial Evaluation: History  Smoking Status  . Former Smoker  . Packs/day: 0.50  . Years: 25.00  . Types: Cigarettes  Smokeless Tobacco  . Never Used    Copy of goals given to participant.

## 2016-07-16 NOTE — Progress Notes (Signed)
Cardiac Individual Treatment Plan  Patient Details  Name: Brian Williams MRN: [id number] Date of Birth: [birth year] Referring Provider:   Flowsheet Row Cardiac Rehab from 07/16/2016 in [facility] Cardiac and Pulmonary Rehab  Referring Provider  Ida Rogue [facility]      Initial Encounter Date:  Flowsheet Row Cardiac Rehab from 07/16/2016 in [facility] Cardiac and Pulmonary Rehab  Date  07/16/16  Referring Provider  Ida Rogue [facility]      Visit Diagnosis: S/P CABG x 4  Patient's Home Medications on Admission:  Current Outpatient Prescriptions:  .  acetaminophen (TYLENOL) 500 MG tablet, Take 1,000 mg by mouth every 6 (six) hours as needed., Disp: , Rfl:  .  ALPRAZolam (XANAX) 1 MG tablet, Take 1 tablet (1 mg total) by mouth 3 (three) times daily as needed. (Patient taking differently: Take 1 mg by mouth 3 (three) times daily as needed for anxiety. ), Disp: 90 tablet, Rfl: 2 .  aspirin EC 81 MG tablet, Take 2 tablets (162 mg total) by mouth daily., Disp: 90 tablet, Rfl:  .  carvedilol (COREG) 12.5 MG tablet, Take 1 tablet (12.5 mg total) by mouth 2 (two) times daily with a meal., Disp: 60 tablet, Rfl: 1 .  Cholecalciferol (VITAMIN D) 2000 units tablet, Take 2,000 Units by mouth daily., Disp: , Rfl:  .  clopidogrel (PLAVIX) 75 MG tablet, Take 1 tablet (75 mg total) by mouth daily., Disp: 90 tablet, Rfl: 3 .  dapagliflozin propanediol (FARXIGA) 5 MG TABS tablet, Take 5 mg by mouth daily. , Disp: , Rfl:  .  ezetimibe (ZETIA) 10 MG tablet, Take 1 tablet (10 mg total) by mouth daily., Disp: 90 tablet, Rfl: 4 .  fenofibrate (TRICOR) 145 MG tablet, Take 145 mg by mouth daily. , Disp: , Rfl:  .  furosemide (LASIX) 40 MG tablet, Take 40 mg by mouth daily. , Disp: , Rfl:  .  gemfibrozil (LOPID) 600 MG tablet, Take 1 tablet (600 mg total) by mouth daily., Disp: 90 tablet, Rfl: 3 .  insulin regular human CONCENTRATED (HUMULIN R) 500 UNIT/ML injection, Continuous rate plus boluses on a sliding scale., Disp: , Rfl:   .  Oxycodone HCl 10 MG TABS, Take 1 tablet (10 mg total) by mouth every 4 (four) hours as needed., Disp: 30 tablet, Rfl: 0 .  potassium chloride (KLOR-CON M10) 10 MEQ tablet, Take 10 mEq by mouth daily. , Disp: , Rfl:  .  rosuvastatin (CRESTOR) 40 MG tablet, Take 1 tablet (40 mg total) by mouth daily., Disp: 90 tablet, Rfl: 3 .  sacubitril-valsartan (ENTRESTO) 49-51 MG, Take 1 tablet by mouth 2 (two) times daily., Disp: 60 tablet, Rfl: 6 .  sertraline (ZOLOFT) 100 MG tablet, TAKE 1 TABLET (100 MG TOTAL) BY MOUTH DAILY., Disp: 30 tablet, Rfl: 2 .  spironolactone (ALDACTONE) 25 MG tablet, Take 1 tablet (25 mg total) by mouth daily., Disp: 90 tablet, Rfl: 3 .  cephALEXin (KEFLEX) 500 MG capsule, Take 1 capsule (500 mg total) by mouth 3 (three) times daily. (Patient not taking: Reported on 07/16/2016), Disp: 21 capsule, Rfl: 0  Past Medical History: Past Medical History:  Diagnosis Date  . AICD (automatic cardioverter/defibrillator) present   . Anxiety    takes Xanax as needed  . Arthritis    back,knees,right shoulder  . Back pain   . Cardiomyopathy ([place])   . CHF (congestive heart failure) (HCC)    takes Lasix and Aldactone daily  . Coronary artery disease    takes Plavix daily  .  Depression    takes Zoloft daily  . Diabetes mellitus without complication (HCC)    Humulin R and Farxiga daily.Fasting blood sugar runs 140  . GERD (gastroesophageal reflux disease)   . Headache   . History of bronchitis   . History of colon polyps    benign  . History of hiatal hernia   . Hyperlipidemia    takes Fenofibrate,Crestor, and Zetia daily  . Hypertension    takes Entresto and Coreg daily  . MI (myocardial infarction) 2001  . Obesity   . Peripheral neuropathy ([place])   . Pneumonia    history of-last time about 14 yrs ago  . PONV (postoperative nausea and vomiting)    after knee surgery 25 yrs ago b/p stayed elevated for a while  . Presence of permanent cardiac pacemaker   . Shortness of  breath dyspnea    with exertion  . Sleep apnea    uses CPAP nightly  . Ventricular tachycardia ([place])    s/p RFCA PVCs 2013    Tobacco Use: History  Smoking Status  . Former Smoker  . Packs/day: 0.50  . Years: 25.00  . Types: Cigarettes  Smokeless Tobacco  . Never Used    Labs: Recent Review Flowsheet Data    Labs for ITP Cardiac and Pulmonary Rehab Latest Ref Rng & Units 05/24/2016 05/24/2016 05/24/2016 05/24/2016 05/25/2016   Cholestrol 0 - 200 mg/dL - - - - -   LDLDIRECT mg/dL - - - - -   HDL >39.00 mg/dL - - - - -   Trlycerides 0.0 - 149.0 mg/dL - - - - -   Hemoglobin A1c 4.8 - 5.6 % - - - - -   PHART 7.350 - 7.450 7.354 7.344(L) 7.284(L) - -   PCO2ART 32.0 - 48.0 mmHg 41.0 42.0 48.6(H) - -   HCO3 20.0 - 28.0 mmol/L 22.8 22.7 22.9 - -   TCO2 0 - 100 mmol/L 24 24 24 23 25    ACIDBASEDEF 0.0 - 2.0 mmol/L 3.0(H) 3.0(H) 4.0(H) - -   O2SAT % 98.0 97.0 92.0 - -       Exercise Target Goals: Date: 07/16/16  Exercise Program Goal: Individual exercise prescription set with THRR, safety & activity barriers. Participant demonstrates ability to understand and report RPE using BORG scale, to self-measure pulse accurately, and to acknowledge the importance of the exercise prescription.  Exercise Prescription Goal: Starting with aerobic activity 30 plus minutes a day, 3 days per week for initial exercise prescription. Provide home exercise prescription and guidelines that participant acknowledges understanding prior to discharge.  Activity Barriers & Risk Stratification:     Activity Barriers & Cardiac Risk Stratification - 07/16/16 1427      Activity Barriers & Cardiac Risk Stratification   Activity Barriers Back Problems;Shortness of Breath;Incisional Pain;Joint Problems;Arthritis;Deconditioning;Decreased Ventricular Function;Balance Concerns;History of Falls  Decreased EF around 30%, Arthritis L knee, no cartilage R knee, L5 disc gone   Cardiac Risk Stratification High      6  Minute Walk:     6 Minute Walk    Row Name 07/16/16 1545         6 Minute Walk   Phase Initial     Distance 1210 feet     Walk Time 6 minutes     # of Rest Breaks 0     MPH 2.29     METS 3.25     RPE 13     Perceived Dyspnea  3  VO2 Peak 11.38     Symptoms Yes (comment)     Comments SOB, sternum pain 4/10     Resting HR 86 bpm     Resting BP 126/66     Max Ex. HR 126 bpm     Max Ex. BP 136/70     2 Minute Post BP 124/62        Initial Exercise Prescription:     Initial Exercise Prescription - 07/16/16 1500      Date of Initial Exercise RX and Referring Provider   Date 07/16/16   Referring Provider Ida Rogue [facility]     Treadmill   MPH 2   Grade 1   Minutes 15   METs 2.81     Recumbant Elliptical   Level 2   RPM 50   Minutes 15   METs 2     T5 Nustep   Level 2   Watts --  80-100 spm   Minutes 15   METs 2     Prescription Details   Frequency (times per week) 2   Duration Progress to 45 minutes of aerobic exercise without signs/symptoms of physical distress     Intensity   THRR 40-80% of Max Heartrate 118-149   Ratings of Perceived Exertion 11-15   Perceived Dyspnea 0-4     Progression   Progression Continue to progress workloads to maintain intensity without signs/symptoms of physical distress.     Resistance Training   Training Prescription Yes   Weight 3 lbs   Reps 10-12      Perform Capillary Blood Glucose checks as needed.  Exercise Prescription Changes:     Exercise Prescription Changes    Row Name 07/16/16 1400             Exercise Review   Progression -  walk test results         Response to Exercise   Blood Pressure (Admit) 126/66       Blood Pressure (Exercise) 136/70       Blood Pressure (Exit) 124/62       Heart Rate (Admit) 86 bpm       Heart Rate (Exercise) 126 bpm       Heart Rate (Exit) 85 bpm       Oxygen Saturation (Admit) 97 %       Oxygen Saturation (Exercise) 96 %       Rating of Perceived  Exertion (Exercise) 13       Symptoms SOB, sternum pain 3/10          Exercise Comments:     Exercise Comments    Row Name 07/16/16 1547           Exercise Comments Aadan wants to get back to playing golf again.          Discharge Exercise Prescription (Final Exercise Prescription Changes):     Exercise Prescription Changes - 07/16/16 1400      Exercise Review   Progression --  walk test results     Response to Exercise   Blood Pressure (Admit) 126/66   Blood Pressure (Exercise) 136/70   Blood Pressure (Exit) 124/62   Heart Rate (Admit) 86 bpm   Heart Rate (Exercise) 126 bpm   Heart Rate (Exit) 85 bpm   Oxygen Saturation (Admit) 97 %   Oxygen Saturation (Exercise) 96 %   Rating of Perceived Exertion (Exercise) 13   Symptoms SOB, sternum pain 3/10      Nutrition:  Target Goals: Understanding of nutrition guidelines, daily intake of sodium 1500mg , cholesterol 200mg , calories 30% from fat and 7% or less from saturated fats, daily to have 5 or more servings of fruits and vegetables.  Biometrics:     Pre Biometrics - 07/16/16 1550      Pre Biometrics   Height 5' 11.9" (1.826 m)   Weight 277 lb 11.2 oz (126 kg)   Waist Circumference 50 inches   Hip Circumference 45 inches   Waist to Hip Ratio 1.11 %   BMI (Calculated) 37.8   Single Leg Stand 1.02 seconds       Nutrition Therapy Plan and Nutrition Goals:     Nutrition Therapy & Goals - 07/16/16 1414      Intervention Plan   Intervention Prescribe, educate and counsel regarding individualized specific dietary modifications aiming towards targeted core components such as weight, hypertension, lipid management, diabetes, heart failure and other comorbidities.   Expected Outcomes Short Term Goal: Understand basic principles of dietary content, such as calories, fat, sodium, cholesterol and nutrients.;Short Term Goal: A plan has been developed with personal nutrition goals set during dietitian  appointment.;Long Term Goal: Adherence to prescribed nutrition plan.      Nutrition Discharge: Rate Your Plate Scores:     Nutrition Assessments - 07/16/16 1414      Rate Your Plate Scores   Pre Score 66   Pre Score % 74 %      Nutrition Goals Re-Evaluation:   Psychosocial: Target Goals: Acknowledge presence or absence of depression, maximize coping skills, provide positive support system. Participant is able to verbalize types and ability to use techniques and skills needed for reducing stress and depression.  Initial Review & Psychosocial Screening:     Initial Psych Review & Screening - 07/16/16 1421      Initial Review   Current issues with Current Depression;Current Psychotropic Meds;Current Sleep Concerns;Current Stress Concerns   Source of Stress Concerns Financial;Chronic Illness     Family Dynamics   Good Support System? No   Strains --  States no emotional support, family will physically help if needed.  Family members continue to smoke around [place] though he has quit and is struggling to remain quit.   Concerns No support system     Barriers   Psychosocial barriers to participate in program There are no identifiable barriers or psychosocial needs.;The patient should benefit from training in stress management and relaxation.     Screening Interventions   Interventions Encouraged to exercise      Quality of Life Scores:     Quality of Life - 07/16/16 1424      Quality of Life Scores   Health/Function Pre 20.27 %   Socioeconomic Pre 20.67 %   Psych/Spiritual Pre 21 %   Family Pre 21 %   GLOBAL Pre 20.61 %      PHQ-9: Recent Review Flowsheet Data    Depression screen [facility] 2/9 07/16/2016 06/19/2016   Decreased Interest 2 0   Down, Depressed, Hopeless 2 1   PHQ - 2 Score 4 1   Altered sleeping 3 -   Tired, decreased energy 3 -   Change in appetite 3 -   Feeling bad or failure about yourself  3 -   Trouble concentrating 1 -   Moving slowly  or fidgety/restless 0 -   Suicidal thoughts 1 -   PHQ-9 Score 18 -   Difficult doing work/chores Somewhat difficult -      Psychosocial  Evaluation and Intervention:   Psychosocial Re-Evaluation:   Vocational Rehabilitation: Provide vocational rehab assistance to qualifying candidates.   Vocational Rehab Evaluation & Intervention:     Vocational Rehab - 07/16/16 1430      Initial Vocational Rehab Evaluation & Intervention   Assessment shows need for Vocational Rehabilitation No      Education: Education Goals: Education classes will be provided on a weekly basis, covering required topics. Participant will state understanding/return demonstration of topics presented.  Learning Barriers/Preferences:     Learning Barriers/Preferences - 07/16/16 1429      Learning Barriers/Preferences   Learning Barriers None   Learning Preferences None      Education Topics: General Nutrition Guidelines/Fats and Fiber: -Group instruction provided by verbal, written material, models and posters to present the general guidelines for heart healthy nutrition. Gives an explanation and review of dietary fats and fiber.   Controlling Sodium/Reading Food Labels: -Group verbal and written material supporting the discussion of sodium use in heart healthy nutrition. Review and explanation with models, verbal and written materials for utilization of the food label.   Exercise Physiology & Risk Factors: - Group verbal and written instruction with models to review the exercise physiology of the cardiovascular system and associated critical values. Details cardiovascular disease risk factors and the goals associated with each risk factor.   Aerobic Exercise & Resistance Training: - Gives group verbal and written discussion on the health impact of inactivity. On the components of aerobic and resistive training programs and the benefits of this training and how to safely progress through these  programs.   Flexibility, Balance, General Exercise Guidelines: - Provides group verbal and written instruction on the benefits of flexibility and balance training programs. Provides general exercise guidelines with specific guidelines to those with heart or lung disease. Demonstration and skill practice provided.   Stress Management: - Provides group verbal and written instruction about the health risks of elevated stress, cause of high stress, and healthy ways to reduce stress.   Depression: - Provides group verbal and written instruction on the correlation between heart/lung disease and depressed mood, treatment options, and the stigmas associated with seeking treatment.   Anatomy & Physiology of the Heart: - Group verbal and written instruction and models provide basic cardiac anatomy and physiology, with the coronary electrical and arterial systems. Review of: AMI, Angina, Valve disease, Heart Failure, Cardiac Arrhythmia, Pacemakers, and the ICD.   Cardiac Procedures: - Group verbal and written instruction and models to describe the testing methods done to diagnose heart disease. Reviews the outcomes of the test results. Describes the treatment choices: Medical Management, Angioplasty, or Coronary Bypass Surgery.   Cardiac Medications: - Group verbal and written instruction to review commonly prescribed medications for heart disease. Reviews the medication, class of the drug, and side effects. Includes the steps to properly store meds and maintain the prescription regimen.   Go Sex-Intimacy & Heart Disease, Get SMART - Goal Setting: - Group verbal and written instruction through game format to discuss heart disease and the return to sexual intimacy. Provides group verbal and written material to discuss and apply goal setting through the application of the S.M.A.R.T. Method.   Other Matters of the Heart: - Provides group verbal, written materials and models to describe Heart  Failure, Angina, Valve Disease, and Diabetes in the realm of heart disease. Includes description of the disease process and treatment options available to the cardiac patient.   Exercise & Equipment Safety: - Individual verbal instruction and  demonstration of equipment use and safety with use of the equipment. Flowsheet Row Cardiac Rehab from 07/16/2016 in [facility] Cardiac and Pulmonary Rehab  Date  07/16/16  Educator  SB  Instruction Review Code  2- meets goals/outcomes      Infection Prevention: - Provides verbal and written material to individual with discussion of infection control including proper hand washing and proper equipment cleaning during exercise session. Flowsheet Row Cardiac Rehab from 07/16/2016 in [facility] Cardiac and Pulmonary Rehab  Date  07/16/16  Educator  SB  Instruction Review Code  2- meets goals/outcomes      Falls Prevention: - Provides verbal and written material to individual with discussion of falls prevention and safety. Flowsheet Row Cardiac Rehab from 07/16/2016 in [facility] Cardiac and Pulmonary Rehab  Date  07/16/16  Educator  SB  Instruction Review Code  2- meets goals/outcomes      Diabetes: - Individual verbal and written instruction to review signs/symptoms of diabetes, desired ranges of glucose level fasting, after meals and with exercise. Advice that pre and post exercise glucose checks will be done for 3 sessions at entry of program. Flowsheet Row Cardiac Rehab from 07/16/2016 in [facility] Cardiac and Pulmonary Rehab  Date  07/16/16  Educator  SB  Instruction Review Code  2- meets goals/outcomes       Knowledge Questionnaire Score:     Knowledge Questionnaire Score - 07/16/16 1429      Knowledge Questionnaire Score   Pre Score 24/28      Core Components/Risk Factors/Patient Goals at Admission:     Personal Goals and Risk Factors at Admission - 07/16/16 1415      Core Components/Risk Factors/Patient Goals on Admission    Weight Management  Obesity;Yes   Intervention Weight Management/Obesity: Establish reasonable short term and long term weight goals.;Weight Management: Provide education and appropriate resources to help participant work on and attain dietary goals.;Weight Management: Develop a combined nutrition and exercise program designed to reach desired caloric intake, while maintaining appropriate intake of nutrient and fiber, sodium and fats, and appropriate energy expenditure required for the weight goal.;Obesity: Provide education and appropriate resources to help participant work on and attain dietary goals.   Admit Weight 277 lb (125.6 kg)   Goal Weight: Short Term 274 lb (124.3 kg)   Goal Weight: Long Term 199 lb (90.3 kg)   Expected Outcomes Short Term: Continue to assess and modify interventions until short term weight is achieved;Long Term: Adherence to nutrition and physical activity/exercise program aimed toward attainment of established weight goal;Weight Loss: Understanding of general recommendations for a balanced deficit meal plan, which promotes 1-2 lb weight loss per week and includes a negative energy balance of [phone] kcal/d   Sedentary Yes   Intervention Provide advice, education, support and counseling about physical activity/exercise needs.;Develop an individualized exercise prescription for aerobic and resistive training based on initial evaluation findings, risk stratification, comorbidities and participant's personal goals.   Expected Outcomes Achievement of increased cardiorespiratory fitness and enhanced flexibility, muscular endurance and strength shown through measurements of functional capacity and personal statement of participant.   Increase Strength and Stamina Yes   Intervention Provide advice, education, support and counseling about physical activity/exercise needs.;Develop an individualized exercise prescription for aerobic and resistive training based on initial evaluation findings, risk  stratification, comorbidities and participant's personal goals.   Expected Outcomes Achievement of increased cardiorespiratory fitness and enhanced flexibility, muscular endurance and strength shown through measurements of functional capacity and personal statement of participant.   Tobacco  Cessation Yes   Number of packs per day quit 05/24/2016   Lives with 3 smokers   Intervention Assist the participant in steps to quit. Provide individualized education and counseling about committing to Tobacco Cessation, relapse prevention, and pharmacological support that can be provided by physician.   Expected Outcomes Long Term: Complete abstinence from all tobacco products for at least 12 months from quit date.   Diabetes Yes   Intervention Provide education about signs/symptoms and action to take for hypo/hyperglycemia.;Provide education about proper nutrition, including hydration, and aerobic/resistive exercise prescription along with prescribed medications to achieve blood glucose in normal ranges: Fasting glucose 65-99 mg/dL   Expected Outcomes Short Term: Participant verbalizes understanding of the signs/symptoms and immediate care of hyper/hypoglycemia, proper foot care and importance of medication, aerobic/resistive exercise and nutrition plan for blood glucose control.;Long Term: Attainment of HbA1C < 7%.   Heart Failure Yes   Intervention Provide a combined exercise and nutrition program that is supplemented with education, support and counseling about heart failure. Directed toward relieving symptoms such as shortness of breath, decreased exercise tolerance, and extremity edema.   Expected Outcomes Improve functional capacity of life;Short term: Attendance in program 2-3 days a week with increased exercise capacity. Reported lower sodium intake. Reported increased fruit and vegetable intake. Reports medication compliance.;Short term: Daily weights obtained and reported for increase. Utilizing diuretic  protocols set by physician.;Long term: Adoption of self-care skills and reduction of barriers for early signs and symptoms recognition and intervention leading to self-care maintenance.   Hypertension Yes   Intervention Provide education on lifestyle modifcations including regular physical activity/exercise, weight management, moderate sodium restriction and increased consumption of fresh fruit, vegetables, and low fat dairy, alcohol moderation, and smoking cessation.;Monitor prescription use compliance.   Expected Outcomes Short Term: Continued assessment and intervention until BP is < 140/56mm HG in hypertensive participants. < 130/23mm HG in hypertensive participants with diabetes, heart failure or chronic kidney disease.;Long Term: Maintenance of blood pressure at goal levels.   Lipids Yes   Intervention Provide education and support for participant on nutrition & aerobic/resistive exercise along with prescribed medications to achieve LDL 70mg , HDL >40mg .   Expected Outcomes Short Term: Participant states understanding of desired cholesterol values and is compliant with medications prescribed. Participant is following exercise prescription and nutrition guidelines.;Long Term: Cholesterol controlled with medications as prescribed, with individualized exercise RX and with personalized nutrition plan. Value goals: LDL < 70mg , HDL > 40 mg.   Stress Yes  Lives with family that continues to smoke around him . He just Quit tobacco products.  Finaincial stress, and family stress:has 2 sons over 61 years old still living at home.   Intervention Offer individual and/or small group education and counseling on adjustment to heart disease, stress management and health-related lifestyle change. Teach and support self-help strategies.;Refer participants experiencing significant psychosocial distress to appropriate mental health specialists for further evaluation and treatment. When possible, include family members  and significant others in education/counseling sessions.   Expected Outcomes Short Term: Participant demonstrates changes in health-related behavior, relaxation and other stress management skills, ability to obtain effective social support, and compliance with psychotropic medications if prescribed.;Long Term: Emotional wellbeing is indicated by absence of clinically significant psychosocial distress or social isolation.      Core Components/Risk Factors/Patient Goals Review:    Core Components/Risk Factors/Patient Goals at Discharge (Final Review):    ITP Comments:     ITP Comments    Row Name 07/16/16 1406  ITP Comments Med review completed today with initial ITP created.  Diagnosis documentation [facility] found in [facility] En[facility] 05/16/2016          [id number] [id number]

## 2016-07-20 ENCOUNTER — Ambulatory Visit: Payer: BLUE CROSS/BLUE SHIELD | Admitting: Dietician

## 2016-07-25 ENCOUNTER — Other Ambulatory Visit: Payer: Self-pay | Admitting: Family Medicine

## 2016-07-25 ENCOUNTER — Encounter: Payer: Self-pay | Admitting: *Deleted

## 2016-07-25 DIAGNOSIS — F329 Major depressive disorder, single episode, unspecified: Secondary | ICD-10-CM

## 2016-07-25 DIAGNOSIS — F32A Depression, unspecified: Secondary | ICD-10-CM

## 2016-07-25 DIAGNOSIS — Z951 Presence of aortocoronary bypass graft: Secondary | ICD-10-CM

## 2016-07-25 DIAGNOSIS — F419 Anxiety disorder, unspecified: Secondary | ICD-10-CM

## 2016-07-25 NOTE — Progress Notes (Signed)
Cardiac Individual Treatment Plan  Patient Details  Name: Brian Williams MRN: [id number] Date of Birth: 30-Jan-1961 Referring Provider:   Flowsheet Row Cardiac Rehab from 07/16/2016 in [facility] Cardiac and Pulmonary Rehab  Referring Provider  Ida Rogue [facility]      Initial Encounter Date:  Flowsheet Row Cardiac Rehab from 07/16/2016 in [facility] Cardiac and Pulmonary Rehab  Date  07/16/16  Referring Provider  Ida Rogue [facility]      Visit Diagnosis: S/P CABG x 4  Patient's Home Medications on Admission:  Current Outpatient Prescriptions:  .  acetaminophen (TYLENOL) 500 MG tablet, Take 1,000 mg by mouth every 6 (six) hours as needed., Disp: , Rfl:  .  ALPRAZolam (XANAX) 1 MG tablet, Take 1 tablet (1 mg total) by mouth 3 (three) times daily as needed. (Patient taking differently: Take 1 mg by mouth 3 (three) times daily as needed for anxiety. ), Disp: 90 tablet, Rfl: 2 .  aspirin EC 81 MG tablet, Take 2 tablets (162 mg total) by mouth daily., Disp: 90 tablet, Rfl:  .  carvedilol (COREG) 12.5 MG tablet, Take 1 tablet (12.5 mg total) by mouth 2 (two) times daily with a meal., Disp: 60 tablet, Rfl: 1 .  cephALEXin (KEFLEX) 500 MG capsule, Take 1 capsule (500 mg total) by mouth 3 (three) times daily. (Patient not taking: Reported on 07/16/2016), Disp: 21 capsule, Rfl: 0 .  Cholecalciferol (VITAMIN D) 2000 units tablet, Take 2,000 Units by mouth daily., Disp: , Rfl:  .  clopidogrel (PLAVIX) 75 MG tablet, Take 1 tablet (75 mg total) by mouth daily., Disp: 90 tablet, Rfl: 3 .  dapagliflozin propanediol (FARXIGA) 5 MG TABS tablet, Take 5 mg by mouth daily. , Disp: , Rfl:  .  ezetimibe (ZETIA) 10 MG tablet, Take 1 tablet (10 mg total) by mouth daily., Disp: 90 tablet, Rfl: 4 .  fenofibrate (TRICOR) 145 MG tablet, Take 145 mg by mouth daily. , Disp: , Rfl:  .  furosemide (LASIX) 40 MG tablet, Take 40 mg by mouth daily. , Disp: , Rfl:  .  gemfibrozil (LOPID) 600 MG tablet, Take 1 tablet (600 mg total) by  mouth daily., Disp: 90 tablet, Rfl: 3 .  insulin regular human CONCENTRATED (HUMULIN R) 500 UNIT/ML injection, Continuous rate plus boluses on a sliding scale., Disp: , Rfl:  .  Oxycodone HCl 10 MG TABS, Take 1 tablet (10 mg total) by mouth every 4 (four) hours as needed., Disp: 30 tablet, Rfl: 0 .  potassium chloride (KLOR-CON M10) 10 MEQ tablet, Take 10 mEq by mouth daily. , Disp: , Rfl:  .  rosuvastatin (CRESTOR) 40 MG tablet, Take 1 tablet (40 mg total) by mouth daily., Disp: 90 tablet, Rfl: 3 .  sacubitril-valsartan (ENTRESTO) 49-51 MG, Take 1 tablet by mouth 2 (two) times daily., Disp: 60 tablet, Rfl: 6 .  sertraline (ZOLOFT) 100 MG tablet, TAKE 1 TABLET (100 MG TOTAL) BY MOUTH DAILY., Disp: 30 tablet, Rfl: 2 .  spironolactone (ALDACTONE) 25 MG tablet, Take 1 tablet (25 mg total) by mouth daily., Disp: 90 tablet, Rfl: 3  Past Medical History: Past Medical History:  Diagnosis Date  . AICD (automatic cardioverter/defibrillator) present   . Anxiety    takes Xanax as needed  . Arthritis    back,knees,right shoulder  . Back pain   . Cardiomyopathy ([place])   . CHF (congestive heart failure) (HCC)    takes Lasix and Aldactone daily  . Coronary artery disease    takes Plavix daily  .  Depression    takes Zoloft daily  . Diabetes mellitus without complication (HCC)    Humulin R and Farxiga daily.Fasting blood sugar runs 140  . GERD (gastroesophageal reflux disease)   . Headache   . History of bronchitis   . History of colon polyps    benign  . History of hiatal hernia   . Hyperlipidemia    takes Fenofibrate,Crestor, and Zetia daily  . Hypertension    takes Entresto and Coreg daily  . MI (myocardial infarction) 2001  . Obesity   . Peripheral neuropathy ([place])   . Pneumonia    history of-last time about 14 yrs ago  . PONV (postoperative nausea and vomiting)    after knee surgery 25 yrs ago b/p stayed elevated for a while  . Presence of permanent cardiac pacemaker   . Shortness of  breath dyspnea    with exertion  . Sleep apnea    uses CPAP nightly  . Ventricular tachycardia ([place])    s/p RFCA PVCs 2013    Tobacco Use: History  Smoking Status  . Former Smoker  . Packs/day: 0.50  . Years: 25.00  . Types: Cigarettes  Smokeless Tobacco  . Never Used    Labs: Recent Review Flowsheet Data    Labs for ITP Cardiac and Pulmonary Rehab Latest Ref Rng & Units 05/24/2016 05/24/2016 05/24/2016 05/24/2016 05/25/2016   Cholestrol 0 - 200 mg/dL - - - - -   LDLDIRECT mg/dL - - - - -   HDL >39.00 mg/dL - - - - -   Trlycerides 0.0 - 149.0 mg/dL - - - - -   Hemoglobin A1c 4.8 - 5.6 % - - - - -   PHART 7.350 - 7.450 7.354 7.344(L) 7.284(L) - -   PCO2ART 32.0 - 48.0 mmHg 41.0 42.0 48.6(H) - -   HCO3 20.0 - 28.0 mmol/L 22.8 22.7 22.9 - -   TCO2 0 - 100 mmol/L 24 24 24 23 25    ACIDBASEDEF 0.0 - 2.0 mmol/L 3.0(H) 3.0(H) 4.0(H) - -   O2SAT % 98.0 97.0 92.0 - -       Exercise Target Goals:    Exercise Program Goal: Individual exercise prescription set with THRR, safety & activity barriers. Participant demonstrates ability to understand and report RPE using BORG scale, to self-measure pulse accurately, and to acknowledge the importance of the exercise prescription.  Exercise Prescription Goal: Starting with aerobic activity 30 plus minutes a day, 3 days per week for initial exercise prescription. Provide home exercise prescription and guidelines that participant acknowledges understanding prior to discharge.  Activity Barriers & Risk Stratification:     Activity Barriers & Cardiac Risk Stratification - 07/16/16 1427      Activity Barriers & Cardiac Risk Stratification   Activity Barriers Back Problems;Shortness of Breath;Incisional Pain;Joint Problems;Arthritis;Deconditioning;Decreased Ventricular Function;Balance Concerns;History of Falls  Decreased EF around 30%, Arthritis L knee, no cartilage R knee, L5 disc gone   Cardiac Risk Stratification High      6 Minute Walk:      6 Minute Walk    Row Name 07/16/16 1545         6 Minute Walk   Phase Initial     Distance 1210 feet     Walk Time 6 minutes     # of Rest Breaks 0     MPH 2.29     METS 3.25     RPE 13     Perceived Dyspnea  3  VO2 Peak 11.38     Symptoms Yes (comment)     Comments SOB, sternum pain 4/10     Resting HR 86 bpm     Resting BP 126/66     Max Ex. HR 126 bpm     Max Ex. BP 136/70     2 Minute Post BP 124/62        Initial Exercise Prescription:     Initial Exercise Prescription - 07/16/16 1500      Date of Initial Exercise RX and Referring Provider   Date 07/16/16   Referring Provider Ida Rogue [facility]     Treadmill   MPH 2   Grade 1   Minutes 15   METs 2.81     Recumbant Elliptical   Level 2   RPM 50   Minutes 15   METs 2     T5 Nustep   Level 2   Watts --  80-100 spm   Minutes 15   METs 2     Prescription Details   Frequency (times per week) 2   Duration Progress to 45 minutes of aerobic exercise without signs/symptoms of physical distress     Intensity   THRR 40-80% of Max Heartrate 118-149   Ratings of Perceived Exertion 11-15   Perceived Dyspnea 0-4     Progression   Progression Continue to progress workloads to maintain intensity without signs/symptoms of physical distress.     Resistance Training   Training Prescription Yes   Weight 3 lbs   Reps 10-12      Perform Capillary Blood Glucose checks as needed.  Exercise Prescription Changes:     Exercise Prescription Changes    Row Name 07/16/16 1400             Exercise Review   Progression -  walk test results         Response to Exercise   Blood Pressure (Admit) 126/66       Blood Pressure (Exercise) 136/70       Blood Pressure (Exit) 124/62       Heart Rate (Admit) 86 bpm       Heart Rate (Exercise) 126 bpm       Heart Rate (Exit) 85 bpm       Oxygen Saturation (Admit) 97 %       Oxygen Saturation (Exercise) 96 %       Rating of Perceived Exertion (Exercise)  13       Symptoms SOB, sternum pain 3/10          Exercise Comments:     Exercise Comments    Row Name 07/16/16 1547           Exercise Comments Finnlee wants to get back to playing golf again.          Discharge Exercise Prescription (Final Exercise Prescription Changes):     Exercise Prescription Changes - 07/16/16 1400      Exercise Review   Progression --  walk test results     Response to Exercise   Blood Pressure (Admit) 126/66   Blood Pressure (Exercise) 136/70   Blood Pressure (Exit) 124/62   Heart Rate (Admit) 86 bpm   Heart Rate (Exercise) 126 bpm   Heart Rate (Exit) 85 bpm   Oxygen Saturation (Admit) 97 %   Oxygen Saturation (Exercise) 96 %   Rating of Perceived Exertion (Exercise) 13   Symptoms SOB, sternum pain 3/10      Nutrition:  Target Goals: Understanding of nutrition guidelines, daily intake of sodium 1500mg , cholesterol 200mg , calories 30% from fat and 7% or less from saturated fats, daily to have 5 or more servings of fruits and vegetables.  Biometrics:     Pre Biometrics - 07/16/16 1550      Pre Biometrics   Height 5' 11.9" (1.826 m)   Weight 277 lb 11.2 oz (126 kg)   Waist Circumference 50 inches   Hip Circumference 45 inches   Waist to Hip Ratio 1.11 %   BMI (Calculated) 37.8   Single Leg Stand 1.02 seconds       Nutrition Therapy Plan and Nutrition Goals:     Nutrition Therapy & Goals - 07/16/16 1414      Intervention Plan   Intervention Prescribe, educate and counsel regarding individualized specific dietary modifications aiming towards targeted core components such as weight, hypertension, lipid management, diabetes, heart failure and other comorbidities.   Expected Outcomes Short Term Goal: Understand basic principles of dietary content, such as calories, fat, sodium, cholesterol and nutrients.;Short Term Goal: A plan has been developed with personal nutrition goals set during dietitian appointment.;Long Term Goal:  Adherence to prescribed nutrition plan.      Nutrition Discharge: Rate Your Plate Scores:     Nutrition Assessments - 07/16/16 1414      Rate Your Plate Scores   Pre Score 66   Pre Score % 74 %      Nutrition Goals Re-Evaluation:   Psychosocial: Target Goals: Acknowledge presence or absence of depression, maximize coping skills, provide positive support system. Participant is able to verbalize types and ability to use techniques and skills needed for reducing stress and depression.  Initial Review & Psychosocial Screening:     Initial Psych Review & Screening - 07/16/16 1421      Initial Review   Current issues with Current Depression;Current Psychotropic Meds;Current Sleep Concerns;Current Stress Concerns   Source of Stress Concerns Financial;Chronic Illness     Family Dynamics   Good Support System? No   Strains --  States no emotional support, family will physically help if needed.  Family members continue to smoke around [place] though he has quit and is struggling to remain quit.   Concerns No support system     Barriers   Psychosocial barriers to participate in program There are no identifiable barriers or psychosocial needs.;The patient should benefit from training in stress management and relaxation.     Screening Interventions   Interventions Encouraged to exercise      Quality of Life Scores:     Quality of Life - 07/16/16 1424      Quality of Life Scores   Health/Function Pre 20.27 %   Socioeconomic Pre 20.67 %   Psych/Spiritual Pre 21 %   Family Pre 21 %   GLOBAL Pre 20.61 %      PHQ-9: Recent Review Flowsheet Data    Depression screen [facility] 2/9 07/16/2016 06/19/2016   Decreased Interest 2 0   Down, Depressed, Hopeless 2 1   PHQ - 2 Score 4 1   Altered sleeping 3 -   Tired, decreased energy 3 -   Change in appetite 3 -   Feeling bad or failure about yourself  3 -   Trouble concentrating 1 -   Moving slowly or fidgety/restless 0 -    Suicidal thoughts 1 -   PHQ-9 Score 18 -   Difficult doing work/chores Somewhat difficult -      Psychosocial  Evaluation and Intervention:   Psychosocial Re-Evaluation:   Vocational Rehabilitation: Provide vocational rehab assistance to qualifying candidates.   Vocational Rehab Evaluation & Intervention:     Vocational Rehab - 07/16/16 1430      Initial Vocational Rehab Evaluation & Intervention   Assessment shows need for Vocational Rehabilitation No      Education: Education Goals: Education classes will be provided on a weekly basis, covering required topics. Participant will state understanding/return demonstration of topics presented.  Learning Barriers/Preferences:     Learning Barriers/Preferences - 07/16/16 1429      Learning Barriers/Preferences   Learning Barriers None   Learning Preferences None      Education Topics: General Nutrition Guidelines/Fats and Fiber: -Group instruction provided by verbal, written material, models and posters to present the general guidelines for heart healthy nutrition. Gives an explanation and review of dietary fats and fiber.   Controlling Sodium/Reading Food Labels: -Group verbal and written material supporting the discussion of sodium use in heart healthy nutrition. Review and explanation with models, verbal and written materials for utilization of the food label.   Exercise Physiology & Risk Factors: - Group verbal and written instruction with models to review the exercise physiology of the cardiovascular system and associated critical values. Details cardiovascular disease risk factors and the goals associated with each risk factor.   Aerobic Exercise & Resistance Training: - Gives group verbal and written discussion on the health impact of inactivity. On the components of aerobic and resistive training programs and the benefits of this training and how to safely progress through these programs.   Flexibility,  Balance, General Exercise Guidelines: - Provides group verbal and written instruction on the benefits of flexibility and balance training programs. Provides general exercise guidelines with specific guidelines to those with heart or lung disease. Demonstration and skill practice provided.   Stress Management: - Provides group verbal and written instruction about the health risks of elevated stress, cause of high stress, and healthy ways to reduce stress.   Depression: - Provides group verbal and written instruction on the correlation between heart/lung disease and depressed mood, treatment options, and the stigmas associated with seeking treatment.   Anatomy & Physiology of the Heart: - Group verbal and written instruction and models provide basic cardiac anatomy and physiology, with the coronary electrical and arterial systems. Review of: AMI, Angina, Valve disease, Heart Failure, Cardiac Arrhythmia, Pacemakers, and the ICD.   Cardiac Procedures: - Group verbal and written instruction and models to describe the testing methods done to diagnose heart disease. Reviews the outcomes of the test results. Describes the treatment choices: Medical Management, Angioplasty, or Coronary Bypass Surgery.   Cardiac Medications: - Group verbal and written instruction to review commonly prescribed medications for heart disease. Reviews the medication, class of the drug, and side effects. Includes the steps to properly store meds and maintain the prescription regimen.   Go Sex-Intimacy & Heart Disease, Get SMART - Goal Setting: - Group verbal and written instruction through game format to discuss heart disease and the return to sexual intimacy. Provides group verbal and written material to discuss and apply goal setting through the application of the S.M.A.R.T. Method.   Other Matters of the Heart: - Provides group verbal, written materials and models to describe Heart Failure, Angina, Valve Disease,  and Diabetes in the realm of heart disease. Includes description of the disease process and treatment options available to the cardiac patient.   Exercise & Equipment Safety: - Individual verbal instruction and  demonstration of equipment use and safety with use of the equipment. Flowsheet Row Cardiac Rehab from 07/16/2016 in [facility] Cardiac and Pulmonary Rehab  Date  07/16/16  Educator  SB  Instruction Review Code  2- meets goals/outcomes      Infection Prevention: - Provides verbal and written material to individual with discussion of infection control including proper hand washing and proper equipment cleaning during exercise session. Flowsheet Row Cardiac Rehab from 07/16/2016 in [facility] Cardiac and Pulmonary Rehab  Date  07/16/16  Educator  SB  Instruction Review Code  2- meets goals/outcomes      Falls Prevention: - Provides verbal and written material to individual with discussion of falls prevention and safety. Flowsheet Row Cardiac Rehab from 07/16/2016 in [facility] Cardiac and Pulmonary Rehab  Date  07/16/16  Educator  SB  Instruction Review Code  2- meets goals/outcomes      Diabetes: - Individual verbal and written instruction to review signs/symptoms of diabetes, desired ranges of glucose level fasting, after meals and with exercise. Advice that pre and post exercise glucose checks will be done for 3 sessions at entry of program. Flowsheet Row Cardiac Rehab from 07/16/2016 in [facility] Cardiac and Pulmonary Rehab  Date  07/16/16  Educator  SB  Instruction Review Code  2- meets goals/outcomes       Knowledge Questionnaire Score:     Knowledge Questionnaire Score - 07/16/16 1429      Knowledge Questionnaire Score   Pre Score 24/28      Core Components/Risk Factors/Patient Goals at Admission:     Personal Goals and Risk Factors at Admission - 07/16/16 1415      Core Components/Risk Factors/Patient Goals on Admission    Weight Management Obesity;Yes   Intervention  Weight Management/Obesity: Establish reasonable short term and long term weight goals.;Weight Management: Provide education and appropriate resources to help participant work on and attain dietary goals.;Weight Management: Develop a combined nutrition and exercise program designed to reach desired caloric intake, while maintaining appropriate intake of nutrient and fiber, sodium and fats, and appropriate energy expenditure required for the weight goal.;Obesity: Provide education and appropriate resources to help participant work on and attain dietary goals.   Admit Weight 277 lb (125.6 kg)   Goal Weight: Short Term 274 lb (124.3 kg)   Goal Weight: Long Term 199 lb (90.3 kg)   Expected Outcomes Short Term: Continue to assess and modify interventions until short term weight is achieved;Long Term: Adherence to nutrition and physical activity/exercise program aimed toward attainment of established weight goal;Weight Loss: Understanding of general recommendations for a balanced deficit meal plan, which promotes 1-2 lb weight loss per week and includes a negative energy balance of [phone] kcal/d   Sedentary Yes   Intervention Provide advice, education, support and counseling about physical activity/exercise needs.;Develop an individualized exercise prescription for aerobic and resistive training based on initial evaluation findings, risk stratification, comorbidities and participant's personal goals.   Expected Outcomes Achievement of increased cardiorespiratory fitness and enhanced flexibility, muscular endurance and strength shown through measurements of functional capacity and personal statement of participant.   Increase Strength and Stamina Yes   Intervention Provide advice, education, support and counseling about physical activity/exercise needs.;Develop an individualized exercise prescription for aerobic and resistive training based on initial evaluation findings, risk stratification, comorbidities and  participant's personal goals.   Expected Outcomes Achievement of increased cardiorespiratory fitness and enhanced flexibility, muscular endurance and strength shown through measurements of functional capacity and personal statement of participant.   Tobacco  Cessation Yes   Number of packs per day quit 05/24/2016   Lives with 3 smokers   Intervention Assist the participant in steps to quit. Provide individualized education and counseling about committing to Tobacco Cessation, relapse prevention, and pharmacological support that can be provided by physician.   Expected Outcomes Long Term: Complete abstinence from all tobacco products for at least 12 months from quit date.   Diabetes Yes   Intervention Provide education about signs/symptoms and action to take for hypo/hyperglycemia.;Provide education about proper nutrition, including hydration, and aerobic/resistive exercise prescription along with prescribed medications to achieve blood glucose in normal ranges: Fasting glucose 65-99 mg/dL   Expected Outcomes Short Term: Participant verbalizes understanding of the signs/symptoms and immediate care of hyper/hypoglycemia, proper foot care and importance of medication, aerobic/resistive exercise and nutrition plan for blood glucose control.;Long Term: Attainment of HbA1C < 7%.   Heart Failure Yes   Intervention Provide a combined exercise and nutrition program that is supplemented with education, support and counseling about heart failure. Directed toward relieving symptoms such as shortness of breath, decreased exercise tolerance, and extremity edema.   Expected Outcomes Improve functional capacity of life;Short term: Attendance in program 2-3 days a week with increased exercise capacity. Reported lower sodium intake. Reported increased fruit and vegetable intake. Reports medication compliance.;Short term: Daily weights obtained and reported for increase. Utilizing diuretic protocols set by physician.;Long  term: Adoption of self-care skills and reduction of barriers for early signs and symptoms recognition and intervention leading to self-care maintenance.   Hypertension Yes   Intervention Provide education on lifestyle modifcations including regular physical activity/exercise, weight management, moderate sodium restriction and increased consumption of fresh fruit, vegetables, and low fat dairy, alcohol moderation, and smoking cessation.;Monitor prescription use compliance.   Expected Outcomes Short Term: Continued assessment and intervention until BP is < 140/86mm HG in hypertensive participants. < 130/33mm HG in hypertensive participants with diabetes, heart failure or chronic kidney disease.;Long Term: Maintenance of blood pressure at goal levels.   Lipids Yes   Intervention Provide education and support for participant on nutrition & aerobic/resistive exercise along with prescribed medications to achieve LDL 70mg , HDL >40mg .   Expected Outcomes Short Term: Participant states understanding of desired cholesterol values and is compliant with medications prescribed. Participant is following exercise prescription and nutrition guidelines.;Long Term: Cholesterol controlled with medications as prescribed, with individualized exercise RX and with personalized nutrition plan. Value goals: LDL < 70mg , HDL > 40 mg.   Stress Yes  Lives with family that continues to smoke around him . He just Quit tobacco products.  Finaincial stress, and family stress:has 2 sons over 10 years old still living at home.   Intervention Offer individual and/or small group education and counseling on adjustment to heart disease, stress management and health-related lifestyle change. Teach and support self-help strategies.;Refer participants experiencing significant psychosocial distress to appropriate mental health specialists for further evaluation and treatment. When possible, include family members and significant others in  education/counseling sessions.   Expected Outcomes Short Term: Participant demonstrates changes in health-related behavior, relaxation and other stress management skills, ability to obtain effective social support, and compliance with psychotropic medications if prescribed.;Long Term: Emotional wellbeing is indicated by absence of clinically significant psychosocial distress or social isolation.      Core Components/Risk Factors/Patient Goals Review:    Core Components/Risk Factors/Patient Goals at Discharge (Final Review):    ITP Comments:     ITP Comments    Row Name 07/16/16 1406 07/25/16 [id number]  ITP Comments Med review completed today with initial ITP created.  Diagnosis documentation can be found in [facility] Encounter 05/16/2016 30 day review completed for Medical Director physician review and signature. Continue ITP unless changes made by physician.New to program         Comments:

## 2016-07-26 ENCOUNTER — Emergency Department
Admission: EM | Admit: 2016-07-26 | Discharge: 2016-07-26 | Disposition: A | Discharge status: Home / Self Care | Payer: BLUE CROSS/BLUE SHIELD | Attending: Emergency Medicine | Admitting: Emergency Medicine

## 2016-07-26 ENCOUNTER — Other Ambulatory Visit: Payer: Self-pay

## 2016-07-26 ENCOUNTER — Emergency Department: Payer: BLUE CROSS/BLUE SHIELD

## 2016-07-26 ENCOUNTER — Encounter: Payer: BLUE CROSS/BLUE SHIELD | Attending: Family Medicine

## 2016-07-26 ENCOUNTER — Encounter: Payer: Self-pay | Admitting: Medical Oncology

## 2016-07-26 VITALS — BP 114/60 | Wt 285.6 lb

## 2016-07-26 DIAGNOSIS — G4730 Sleep apnea, unspecified: Secondary | ICD-10-CM | POA: Insufficient documentation

## 2016-07-26 DIAGNOSIS — E785 Hyperlipidemia, unspecified: Secondary | ICD-10-CM | POA: Insufficient documentation

## 2016-07-26 DIAGNOSIS — I252 Old myocardial infarction: Secondary | ICD-10-CM | POA: Insufficient documentation

## 2016-07-26 DIAGNOSIS — R0789 Other chest pain: Secondary | ICD-10-CM | POA: Diagnosis present

## 2016-07-26 DIAGNOSIS — Z951 Presence of aortocoronary bypass graft: Secondary | ICD-10-CM | POA: Insufficient documentation

## 2016-07-26 DIAGNOSIS — Z7982 Long term (current) use of aspirin: Secondary | ICD-10-CM | POA: Insufficient documentation

## 2016-07-26 DIAGNOSIS — E669 Obesity, unspecified: Secondary | ICD-10-CM

## 2016-07-26 DIAGNOSIS — Z6838 Body mass index (BMI) 38.0-38.9, adult: Secondary | ICD-10-CM | POA: Insufficient documentation

## 2016-07-26 DIAGNOSIS — Z87891 Personal history of nicotine dependence: Secondary | ICD-10-CM | POA: Diagnosis not present

## 2016-07-26 DIAGNOSIS — I110 Hypertensive heart disease with heart failure: Secondary | ICD-10-CM | POA: Insufficient documentation

## 2016-07-26 DIAGNOSIS — R079 Chest pain, unspecified: Secondary | ICD-10-CM

## 2016-07-26 DIAGNOSIS — E119 Type 2 diabetes mellitus without complications: Secondary | ICD-10-CM | POA: Diagnosis present

## 2016-07-26 DIAGNOSIS — E1142 Type 2 diabetes mellitus with diabetic polyneuropathy: Secondary | ICD-10-CM | POA: Diagnosis not present

## 2016-07-26 DIAGNOSIS — Z794 Long term (current) use of insulin: Secondary | ICD-10-CM | POA: Insufficient documentation

## 2016-07-26 DIAGNOSIS — Z95810 Presence of automatic (implantable) cardiac defibrillator: Secondary | ICD-10-CM | POA: Diagnosis not present

## 2016-07-26 DIAGNOSIS — I2510 Atherosclerotic heart disease of native coronary artery without angina pectoris: Secondary | ICD-10-CM | POA: Diagnosis present

## 2016-07-26 DIAGNOSIS — I5022 Chronic systolic (congestive) heart failure: Secondary | ICD-10-CM | POA: Diagnosis present

## 2016-07-26 DIAGNOSIS — F411 Generalized anxiety disorder: Secondary | ICD-10-CM | POA: Diagnosis present

## 2016-07-26 DIAGNOSIS — Z7902 Long term (current) use of antithrombotics/antiplatelets: Secondary | ICD-10-CM | POA: Diagnosis not present

## 2016-07-26 DIAGNOSIS — R600 Localized edema: Secondary | ICD-10-CM | POA: Diagnosis not present

## 2016-07-26 DIAGNOSIS — F329 Major depressive disorder, single episode, unspecified: Secondary | ICD-10-CM | POA: Diagnosis not present

## 2016-07-26 DIAGNOSIS — K219 Gastro-esophageal reflux disease without esophagitis: Secondary | ICD-10-CM | POA: Insufficient documentation

## 2016-07-26 DIAGNOSIS — Z79899 Other long term (current) drug therapy: Secondary | ICD-10-CM | POA: Diagnosis not present

## 2016-07-26 DIAGNOSIS — I509 Heart failure, unspecified: Secondary | ICD-10-CM | POA: Insufficient documentation

## 2016-07-26 DIAGNOSIS — F419 Anxiety disorder, unspecified: Secondary | ICD-10-CM | POA: Diagnosis not present

## 2016-07-26 DIAGNOSIS — I420 Dilated cardiomyopathy: Secondary | ICD-10-CM | POA: Diagnosis present

## 2016-07-26 DIAGNOSIS — Z713 Dietary counseling and surveillance: Secondary | ICD-10-CM | POA: Diagnosis not present

## 2016-07-26 LAB — CUP PACEART REMOTE DEVICE CHECK
Battery Remaining Longevity: 80 mo
Date Time Interrogation Session: 20171109170420
HighPow Impedance: 39 Ohm
Implantable Lead Implant Date: 20160929
Implantable Lead Implant Date: 20160929
Implantable Lead Location: 753859
Implantable Lead Location: 753860
Implantable Lead Model: 7071
Implantable Pulse Generator Implant Date: 20160929
Lead Channel Impedance Value: 350 Ohm
Lead Channel Impedance Value: 440 Ohm
Lead Channel Pacing Threshold Amplitude: 0.5 V
Lead Channel Pacing Threshold Amplitude: 1 V
Lead Channel Pacing Threshold Pulse Width: 0.4 ms
Lead Channel Pacing Threshold Pulse Width: 0.4 ms
Lead Channel Sensing Intrinsic Amplitude: 11.6 mV
Lead Channel Sensing Intrinsic Amplitude: 3.4 mV
Pulse Gen Serial Number: 7306375

## 2016-07-26 LAB — BASIC METABOLIC PANEL
Anion gap: 9 (ref 5–15)
BUN: 18 mg/dL (ref 6–20)
CO2: 21 mmol/L — ABNORMAL LOW (ref 22–32)
Calcium: 9.6 mg/dL (ref 8.9–10.3)
Chloride: 107 mmol/L (ref 101–111)
Creatinine, Ser: 0.86 mg/dL (ref 0.61–1.24)
GFR calc Af Amer: >60 mL/min (ref >60)
GFR calc non Af Amer: >60 mL/min (ref >60)
Glucose, Bld: 202 mg/dL — ABNORMAL HIGH (ref 65–99)
Potassium: 4.1 mmol/L (ref 3.5–5.1)
Sodium: 137 mmol/L (ref 135–145)

## 2016-07-26 LAB — CBC
HCT: 35.4 % — ABNORMAL LOW (ref 40.0–52.0)
Hemoglobin: 12.2 g/dL — ABNORMAL LOW (ref 13.0–18.0)
MCH: 30.6 pg (ref 26.0–34.0)
MCHC: 34.5 g/dL (ref 32.0–36.0)
MCV: 88.7 fL (ref 80.0–100.0)
Platelets: 196 10*3/uL (ref 150–440)
RBC: 3.99 MIL/uL — ABNORMAL LOW (ref 4.40–5.90)
RDW: 14.8 % — ABNORMAL HIGH (ref 11.5–14.5)
WBC: 6.5 10*3/uL (ref 3.8–10.6)

## 2016-07-26 LAB — GLUCOSE, CAPILLARY: Glucose-Capillary: 243 mg/dL — ABNORMAL HIGH (ref 65–99)

## 2016-07-26 LAB — TROPONIN I
Troponin I: <0.03 ng/mL (ref <0.03)
Troponin I: <0.03 ng/mL (ref <0.03)

## 2016-07-26 NOTE — ED Provider Notes (Signed)
[facility] Emergency Department Provider Note  __________________________________________   First MD Initiated Contact with Patient 07/26/16 1019     (approximate)  I have reviewed the triage vital signs and the nursing notes.   HISTORY  Chief Complaint Chest Pain   HPI Brian Williams is a 55 y.o. male who had a cardiac bypass 2 months ago his presenting with several weeks of chest discomfort as well as neck burning with exertion. He says when he walks he generally has the symptoms which then go away after about 10 minutes of rest. He says that the symptoms are similar to those that were presenting themselves prior to his CABG. The patient says that he is compliant with both his aspirin and his Plavix. He denies any nausea or vomiting. Says that he was at cardiac rehabilitation this morning and sent to the emergency department because of his symptoms as well as a 6 pound weight gain over the past week. He denies having any severe edema to his bilateral lower extremities. He has a mild amount of edema to the bilateral lower extremities which she says has drastically improved over time.   Past Medical History:  Diagnosis Date  . AICD (automatic cardioverter/defibrillator) present   . Anxiety    takes Xanax as needed  . Arthritis    back,knees,right shoulder  . Back pain   . Cardiomyopathy ([place])   . CHF (congestive heart failure) (HCC)    takes Lasix and Aldactone daily  . Coronary artery disease    takes Plavix daily  . Depression    takes Zoloft daily  . Diabetes mellitus without complication (HCC)    Humulin R and Farxiga daily.Fasting blood sugar runs 140  . GERD (gastroesophageal reflux disease)   . Headache   . History of bronchitis   . History of colon polyps    benign  . History of hiatal hernia   . Hyperlipidemia    takes Fenofibrate,Crestor, and Zetia daily  . Hypertension    takes Entresto and Coreg daily  . MI (myocardial infarction)  2001  . Obesity   . Peripheral neuropathy ([place])   . Pneumonia    history of-last time about 14 yrs ago  . PONV (postoperative nausea and vomiting)    after knee surgery 25 yrs ago b/p stayed elevated for a while  . Presence of permanent cardiac pacemaker   . Shortness of breath dyspnea    with exertion  . Sleep apnea    uses CPAP nightly  . Ventricular tachycardia ([place])    s/p RFCA PVCs 2013    Patient Active Problem List   Diagnosis Date Noted  . Chest pain with moderate risk for cardiac etiology 07/26/2016  . Hx of CABG 07/26/2016  . Cerumen impaction 06/13/2016  . Coronary artery disease 05/24/2016  . Coronary artery disease involving native coronary artery of native heart with angina pectoris with documented spasm ([place])   . History of coronary artery stent placement   . Unstable angina ([place]) 05/04/2016  . Morbid obesity with BMI of 40.0-44.9, adult ([place]) 04/06/2016  . Memory disturbance 01/06/2016  . OSA on CPAP 01/06/2016  . Diabetes, polyneuropathy ([place]) 01/06/2016  . Insomnia 01/06/2016  . Hidradenitis suppurativa 10/06/2015  . Preventative health care 10/06/2015  . Chronic back pain 05/02/2015  . Essential hypertension 05/02/2015  . Hyperlipidemia 05/02/2015  . Generalized anxiety disorder 05/02/2015  . ICD (implantable cardioverter-defibrillator) in place 04/20/2015  . Congestive dilated cardiomyopathy ([place]) 04/20/2015  .  Diabetes mellitus type 2, uncontrolled, with complications ([place]) 123XX123  . Chronic systolic heart failure ([place]) 09/30/2014  . HLD (hyperlipidemia) 09/30/2014    Past Surgical History:  Procedure Laterality Date  . BACK SURGERY    . CARDIAC CATHETERIZATION    . CARDIAC CATHETERIZATION Left 05/10/2016   Procedure: Left Heart Cath and Coronary Angiography;  Surgeon: Minna Merritts, MD;  Location: [facility];  Service: Cardiovascular;  Laterality: Left;  . CARDIAC DEFIBRILLATOR PLACEMENT  10/16/2007   ICD Model number 2207-36  serial number [id number]  . CARDIAC ELECTROPHYSIOLOGY STUDY AND ABLATION    . CHOLECYSTECTOMY  2001  . COLONOSCOPY    . CORONARY ANGIOPLASTY WITH STENT PLACEMENT     7 stents  . CORONARY ARTERY BYPASS GRAFT N/A 05/24/2016   Procedure: CORONARY ARTERY BYPASS GRAFTING (CABG) x four , using left internal mammary artery and left leg greater saphenous vein harvested endoscopically;  Surgeon: Gaye Pollack, MD;  Location: [place] [facility];  Service: Open Heart Surgery;  Laterality: N/A;  . EP IMPLANTABLE DEVICE N/A 06/16/2015   Procedure: ICD Generator Changeout;  Surgeon: Deboraha Sprang, MD;  Location: [place] [facility];  Service: Cardiovascular;  Laterality: N/A;  . ESOPHAGOGASTRODUODENOSCOPY    . INSERT / REPLACE / REMOVE PACEMAKER    . KNEE SURGERY     bilateral knee   . [name] WITHOUT CARDIOVERSION N/A 05/24/2016   Procedure: TRANSESOPHAGEAL ECHOCARDIOGRAM ([name]);  Surgeon: Gaye Pollack, MD;  Location: [place];  Service: Open Heart Surgery;  Laterality: N/A;  . VASECTOMY      Prior to Admission medications   Medication Sig Start Date End Date Taking? Authorizing Provider  acetaminophen (TYLENOL) 500 MG tablet Take 1,000 mg by mouth every 6 (six) hours as needed.   Yes Historical Provider, MD  ALPRAZolam Duanne Moron) 1 MG tablet Take 1 tablet (1 mg total) by mouth 3 (three) times daily as needed. Patient taking differently: Take 1 mg by mouth 3 (three) times daily as needed for anxiety.  05/16/16  Yes [name], DO  aspirin EC 81 MG tablet Take 2 tablets (162 mg total) by mouth daily. 06/21/16  Yes Minna Merritts, MD  carvedilol (COREG) 12.5 MG tablet Take 1 tablet (12.5 mg total) by mouth 2 (two) times daily with a meal. Patient taking differently: Take 6.25 mg by mouth 2 (two) times daily with a meal.  05/30/16  Yes Donielle Liston Alba, PA-C  Cholecalciferol (VITAMIN D) 2000 units tablet Take 2,000 Units by mouth daily.   Yes Historical Provider, MD  clopidogrel (PLAVIX) 75 MG tablet Take 1 tablet (75 mg total)  by mouth daily. 06/21/16  Yes Minna Merritts, MD  dapagliflozin propanediol (FARXIGA) 5 MG TABS tablet Take 5 mg by mouth daily.  09/27/15  Yes Historical Provider, MD  ezetimibe (ZETIA) 10 MG tablet Take 1 tablet (10 mg total) by mouth daily. 12/02/15  Yes Minna Merritts, MD  fenofibrate (TRICOR) 145 MG tablet Take 145 mg by mouth daily.  08/12/14  Yes Historical Provider, MD  furosemide (LASIX) 40 MG tablet Take 40 mg by mouth daily.  09/13/14  Yes Historical Provider, MD  gemfibrozil (LOPID) 600 MG tablet Take 1 tablet (600 mg total) by mouth daily. 02/17/16  Yes [name], DO  Oxycodone HCl 10 MG TABS Take 1 tablet (10 mg total) by mouth every 4 (four) hours as needed. 07/04/16  Yes Gaye Pollack, MD  potassium chloride (KLOR-CON M10) 10 MEQ tablet  Take 10 mEq by mouth daily.  09/02/14  Yes Historical Provider, MD  rosuvastatin (CRESTOR) 40 MG tablet Take 1 tablet (40 mg total) by mouth daily. 08/08/15  Yes Minna Merritts, MD  sacubitril-valsartan (ENTRESTO) 49-51 MG Take 1 tablet by mouth 2 (two) times daily. 06/28/16  Yes Minna Merritts, MD  sertraline (ZOLOFT) 100 MG tablet TAKE 1 TABLET (100 MG TOTAL) BY MOUTH DAILY. 07/25/16  Yes [name], DO  spironolactone (ALDACTONE) 25 MG tablet Take 1 tablet (25 mg total) by mouth daily. 01/23/16 01/22/17 Yes Minna Merritts, MD  insulin regular human CONCENTRATED (HUMULIN R) 500 UNIT/ML injection Continuous rate plus boluses on a sliding scale. 12/02/14   Historical Provider, MD    Allergies Morphine and related and Naproxen  Family History  Problem Relation Age of Onset  . Heart attack Mother 40  . Hypertension Mother   . Heart attack Father 45  . Hypertension Father   . Hypertension Maternal Uncle   . Heart disease Maternal Uncle   . Heart disease Maternal Grandmother   . Stroke Maternal Grandmother     Social History Social History  Substance Use Topics  . Smoking status: Former Smoker    Packs/day: 0.50    Years: 25.00     Types: Cigarettes  . Smokeless tobacco: Never Used  . Alcohol use No    Review of Systems Constitutional: No fever/chills Eyes: No visual changes. ENT: No sore throat. Cardiovascular:  Respiratory: Denies shortness of breath. Gastrointestinal: No abdominal pain.  No nausea, no vomiting.  No diarrhea.  No constipation. Genitourinary: Negative for dysuria. Musculoskeletal: Negative for back pain. Skin: Negative for rash. Neurological: Negative for headaches, focal weakness or numbness.  10-point ROS otherwise negative.  ____________________________________________   PHYSICAL EXAM:  VITAL SIGNS: ED Triage Vitals  Enc Vitals Group     BP 07/26/16 0957 115/63     Pulse Rate 07/26/16 0957 82     Resp 07/26/16 0957 18     Temp 07/26/16 0957 98.2 F (36.8 C)     Temp Source 07/26/16 0957 Oral     SpO2 07/26/16 0957 96 %     Weight 07/26/16 0955 285 lb (129.3 kg)     Height 07/26/16 0955 6' (1.829 m)     Head Circumference --      Peak Flow --      Pain Score 07/26/16 0956 0     Pain Loc --      Pain Edu? --      Excl. in [place]? --     Constitutional: Alert and oriented. Well appearing and in no acute distress. Eyes: Conjunctivae are normal. PERRL. EOMI. Head: Atraumatic. Nose: No congestion/rhinnorhea. Mouth/Throat: Mucous membranes are moist.   Neck: No stridor.   Cardiovascular: Normal rate, regular rhythm. Grossly normal heart sounds.   Respiratory: Normal respiratory effort.  No retractions. Lungs CTAB. Gastrointestinal: Soft and nontender. No distention.  Musculoskeletal: Mild edema to the bilateral lower extremities to the mid calves.  Neurologic:  Normal speech and language. No gross focal neurologic deficits are appreciated.  Skin:  Skin is warm, dry and intact. No rash noted. Psychiatric: Mood and affect are normal. Speech and behavior are normal.  ____________________________________________   LABS (all labs ordered are listed, but only abnormal results are  displayed)  Labs Reviewed  BASIC METABOLIC PANEL - Abnormal; Notable for the following:       Result Value   CO2 21 (*)    Glucose,  Bld 202 (*)    All other components within normal limits  CBC - Abnormal; Notable for the following:    RBC 3.99 (*)    Hemoglobin 12.2 (*)    HCT 35.4 (*)    RDW 14.8 (*)    All other components within normal limits  TROPONIN I  TROPONIN I   ____________________________________________  EKG  ED ECG REPORT I, Doran Stabler, the attending physician, personally viewed and interpreted this ECG.   Date: 07/26/2016  EKG Time: 0954  Rate: 82  Rhythm: normal sinus rhythm  Axis: Rightward axis  Intervals: Incomplete right bundle-branch block  ST&T Change: T wave inversions in 2, 3 and aVF. Also with T wave inversions in V5 and V6.  T-wave inversions seen previously on the EKG from 06/29/2016.  T waves are upright in V2. T waves flattened and EKG lead V3 today. ____________________________________________  RADIOLOGY  DG Chest 1 View (Final result)  Result time 07/26/16 11:08:50  Final result by Gilford Silvius, MD (07/26/16 11:08:50)           Narrative:   CLINICAL DATA: Chest pain. CABG 2 months ago  EXAM: CHEST 1 VIEW  COMPARISON: 06/27/2016  FINDINGS: Chronic cardiopericardial enlargement. Status post CABG. Dual-chamber ICD/ pacer from the left in stable position. There is no edema, consolidation, effusion, or pneumothorax.  IMPRESSION: Stable from prior. No evidence of active disease.   Electronically Signed By: Monte Fantasia M.D. On: 07/26/2016 11:08            ____________________________________________   PROCEDURES  Procedure(s) performed:   Procedures  Critical Care performed:   ____________________________________________   INITIAL IMPRESSION / ASSESSMENT AND PLAN / ED COURSE  Pertinent labs & imaging results that were available during my care of the patient were reviewed by me and  considered in my medical decision making (see chart for details).  ----------------------------------------- 4:06 PM on 07/26/2016 -----------------------------------------  Initially discussed the patient's case with Dr. Fletcher Anon who suggested the patient be admitted for a stress test. However, when I discussed this plan with the patient said that he would rather go home and do the stress test as an outpatient and denied any chest pain. I then discussed the case with Dr. Rockey Situ of the cardiology service who agrees that the patient will likely be appropriate for home but recommended a second troponin before the patient was to be discharged. However, when I explained to the patient this plan he then said that he was having chest pressure on the right side of his chest at rest. I then made the decision to admit the patient hospital and the patient agreed. The patient was signed out to Dr. Anselm Jungling of the medicine service. However, upon interview from Dr. Anselm Jungling the patient denied any chest pain. Dr. Anselm Jungling then discussed the case again with Dr. Rockey Situ and the decision was made of the patient follow up tomorrow as an outpatient. The patient was made arrangements by Dr. Rockey Situ for the stress test to be done at 9:30 AM. The patient is to report the medical multiple at 9 AM. The patient is to hold his carvedilol as well tomorrow morning. The patient will then follow-up in the office with Dr. Rockey Situ tomorrow afternoon at 3:40 PM. These plans were discussed with the patient and he is understanding and willing to comply. Will be discharged home.     Clinical Course      ____________________________________________   FINAL CLINICAL IMPRESSION(S) / ED DIAGNOSES  Final diagnoses:  Chest pain, unspecified type      NEW MEDICATIONS STARTED DURING THIS VISIT:  New Prescriptions   No medications on file     Note:  This document was prepared using Dragon voice recognition software and may include  unintentional dictation errors.    Orbie Pyo, MD 07/26/16 [phone]

## 2016-07-26 NOTE — Consult Note (Signed)
Cardiology Consultation Note  Patient ID: Brian Williams, MRN: [id number], DOB/AGE: 1960/10/23 55 y.o. Admit date: 07/26/2016   Date of Consult: 07/26/2016 Primary Physician: [name], DO Primary Cardiologist: Dr. Rockey Situ, MD Requesting Physician: Dr. Clearnce Hasten, MD  Chief Complaint: Chest pain Reason for Consult: Same  HPI: 55 y.o. male with h/o CAD s/p 4 vessel CABG in [id number], chronic systolic CHF/ICM, history of sustained VT s/p VT ablation in [place] at [facility] s/p ICD with recent generator change in 2016, long history of tobacco abuse, poorly controlled diabetes, HTN, HLD, obesity, OSA on CPAP who was at [facility] today and had an episode of chest pain and was sent to the ED.   His cardiomyopathy dates back to 2002 with multiple cardiac caths since. He had distal RCA disease and remote stenting of the RCA and LAD. Subsequent cardiac caths over the next several years for ISR and reported PTCA and brachytherapy. Cardic cath in 12/2009 reportedly showed pat LAD and RCA stents. Echo in March 2016 shwoed an EF of 20-25%. He has been seen by the heart failure teams at both [place] and [place]. Back in September 2017 he reported a 6-8 week history of burning in his chest with ambulation that radiated to his throat. This was a different pain than his prior chest pain. There was some associated SOB and fatigue. He underwent LHC on 05/10/2016 that showed 70% eccentric distal LM stenosis, 80% proximal LAD ISR, 80% diagonal stenosis, and tandem 90% proximal and mid RCA stenosis with mild ISR. LVEF was estimated at 35-35%. He was scheduled for outpatient cardiac bypass surgery, undergoing successful 4 vessel CABG on 05/24/2016 with LIMA to LAD, SVG to Diag, SVG to OM, SVG to PDA. [name] on 9/7 showed EF 30-35%. In follow up with [facility] he was doing well and asymptomatic. He called on 10/12 stating he had some SOB and chest heaviness and burning in his throat with ambulation. Troponin was negative and EKG was not  acute. He just went on a cruise 2 weeks ago and was without any symptoms the entire trip. He has been tolerating cardiac rehab without issues (2nd visit today). However, today he complained of chest and neck pain which have been present for the past several weeks. He was sent to the ED where he has been asymptomatic. He reports his pain is with exertion and will improve with rest after approximately 10 minutes. He has been compliant with both ASA and Plavix. Secondly, he noted a 6 pound weight gain over the past 1 week, though this is much improved from swelling pre-bypass surgery.   Initial troponin negative. HGB improved to 12.2 from his post-operative anemia. WBC normal. SCr 0.86. K+ 4.1. CXR stable and witout evidence of acute process. EKG with NSR, 82 bpm, right axis deviation, incomplete LBBB, nonspecific inferolateral st/t changes. Vitals all normal. He asks multiple times if he can go home as he feels 100% ok at this time.     Past Medical History:  Diagnosis Date  . AICD (automatic cardioverter/defibrillator) present   . Anxiety    takes Xanax as needed  . Arthritis    back,knees,right shoulder  . Back pain   . Cardiomyopathy ([place])   . CHF (congestive heart failure) (HCC)    takes Lasix and Aldactone daily  . Coronary artery disease    takes Plavix daily  . Depression    takes Zoloft daily  . Diabetes mellitus without complication ([place])    Humulin R and [place]  daily.Fasting blood sugar runs 140  . GERD (gastroesophageal reflux disease)   . Headache   . History of bronchitis   . History of colon polyps    benign  . History of hiatal hernia   . Hyperlipidemia    takes Fenofibrate,Crestor, and Zetia daily  . Hypertension    takes Entresto and Coreg daily  . MI (myocardial infarction) 2001  . Obesity   . Peripheral neuropathy ([place])   . Pneumonia    history of-last time about 14 yrs ago  . PONV (postoperative nausea and vomiting)    after knee surgery 25 yrs ago b/p stayed  elevated for a while  . Presence of permanent cardiac pacemaker   . Shortness of breath dyspnea    with exertion  . Sleep apnea    uses CPAP nightly  . Ventricular tachycardia ([place])    s/p RFCA PVCs 2013      Most Recent Cardiac Studies: As above   Surgical History:  Past Surgical History:  Procedure Laterality Date  . BACK SURGERY    . CARDIAC CATHETERIZATION    . CARDIAC CATHETERIZATION Left 05/10/2016   Procedure: Left Heart Cath and Coronary Angiography;  Surgeon: Minna Merritts, MD;  Location: [facility];  Service: Cardiovascular;  Laterality: Left;  . CARDIAC DEFIBRILLATOR PLACEMENT  10/16/2007   ICD Model number 2207-36 serial number [id number]  . CARDIAC ELECTROPHYSIOLOGY STUDY AND ABLATION    . CHOLECYSTECTOMY  2001  . COLONOSCOPY    . CORONARY ANGIOPLASTY WITH STENT PLACEMENT     7 stents  . CORONARY ARTERY BYPASS GRAFT N/A 05/24/2016   Procedure: CORONARY ARTERY BYPASS GRAFTING (CABG) x four , using left internal mammary artery and left leg greater saphenous vein harvested endoscopically;  Surgeon: Gaye Pollack, MD;  Location: [facility];  Service: Open Heart Surgery;  Laterality: N/A;  . EP IMPLANTABLE DEVICE N/A 06/16/2015   Procedure: ICD Generator Changeout;  Surgeon: Deboraha Sprang, MD;  Location: [facility];  Service: Cardiovascular;  Laterality: N/A;  . ESOPHAGOGASTRODUODENOSCOPY    . INSERT / REPLACE / REMOVE PACEMAKER    . KNEE SURGERY     bilateral knee   . [name] WITHOUT CARDIOVERSION N/A 05/24/2016   Procedure: TRANSESOPHAGEAL ECHOCARDIOGRAM ([name]);  Surgeon: Gaye Pollack, MD;  Location: [facility];  Service: Open Heart Surgery;  Laterality: N/A;  . VASECTOMY       Home Meds: Prior to Admission medications   Medication Sig Start Date End Date Taking? Authorizing Provider  acetaminophen (TYLENOL) 500 MG tablet Take 1,000 mg by mouth every 6 (six) hours as needed.   Yes Historical Provider, MD  ALPRAZolam Duanne Moron) 1 MG tablet Take 1 tablet (1 mg total)  by mouth 3 (three) times daily as needed. Patient taking differently: Take 1 mg by mouth 3 (three) times daily as needed for anxiety.  05/16/16  Yes [name], DO  aspirin EC 81 MG tablet Take 2 tablets (162 mg total) by mouth daily. 06/21/16  Yes Minna Merritts, MD  carvedilol (COREG) 12.5 MG tablet Take 1 tablet (12.5 mg total) by mouth 2 (two) times daily with a meal. Patient taking differently: Take 6.25 mg by mouth 2 (two) times daily with a meal.  05/30/16  Yes Donielle Liston Alba, PA-C  Cholecalciferol (VITAMIN D) 2000 units tablet Take 2,000 Units by mouth daily.   Yes Historical Provider, MD  clopidogrel (PLAVIX) 75 MG tablet Take 1 tablet (75 mg total) by  mouth daily. 06/21/16  Yes Minna Merritts, MD  dapagliflozin propanediol (FARXIGA) 5 MG TABS tablet Take 5 mg by mouth daily.  09/27/15  Yes Historical Provider, MD  ezetimibe (ZETIA) 10 MG tablet Take 1 tablet (10 mg total) by mouth daily. 12/02/15  Yes Minna Merritts, MD  fenofibrate (TRICOR) 145 MG tablet Take 145 mg by mouth daily.  08/12/14  Yes Historical Provider, MD  furosemide (LASIX) 40 MG tablet Take 40 mg by mouth daily.  09/13/14  Yes Historical Provider, MD  gemfibrozil (LOPID) 600 MG tablet Take 1 tablet (600 mg total) by mouth daily. 02/17/16  Yes [name], DO  Oxycodone HCl 10 MG TABS Take 1 tablet (10 mg total) by mouth every 4 (four) hours as needed. 07/04/16  Yes Gaye Pollack, MD  potassium chloride (KLOR-CON M10) 10 MEQ tablet Take 10 mEq by mouth daily.  09/02/14  Yes Historical Provider, MD  rosuvastatin (CRESTOR) 40 MG tablet Take 1 tablet (40 mg total) by mouth daily. 08/08/15  Yes Minna Merritts, MD  sacubitril-valsartan (ENTRESTO) 49-51 MG Take 1 tablet by mouth 2 (two) times daily. 06/28/16  Yes Minna Merritts, MD  sertraline (ZOLOFT) 100 MG tablet TAKE 1 TABLET (100 MG TOTAL) BY MOUTH DAILY. 07/25/16  Yes [name], DO  spironolactone (ALDACTONE) 25 MG tablet Take 1 tablet (25 mg total) by mouth  daily. 01/23/16 01/22/17 Yes Minna Merritts, MD  insulin regular human CONCENTRATED (HUMULIN R) 500 UNIT/ML injection Continuous rate plus boluses on a sliding scale. 12/02/14   Historical Provider, MD    Inpatient Medications:     Allergies:  Allergies  Allergen Reactions  . Morphine And Related Nausea And Vomiting  . Naproxen Other (See Comments)    Causes hyperactivity    Social History   Social History  . Marital status: Married    Spouse name: N/A  . Number of children: N/A  . Years of education: N/A   Occupational History  . Not on file.   Social History Main Topics  . Smoking status: Former Smoker    Packs/day: 0.50    Years: 25.00    Types: Cigarettes  . Smokeless tobacco: Never Used  . Alcohol use No  . Drug use: No  . Sexual activity: Not Currently   Other Topics Concern  . Not on file   Social History Narrative   Married.   Moved from [place].   Disabled.        Family History  Problem Relation Age of Onset  . Heart attack Mother 40  . Hypertension Mother   . Heart attack Father 56  . Hypertension Father   . Hypertension Maternal Uncle   . Heart disease Maternal Uncle   . Heart disease Maternal Grandmother   . Stroke Maternal Grandmother      Review of Systems: Review of Systems  Constitutional: Positive for malaise/fatigue. Negative for chills, diaphoresis, fever and weight loss.  HENT: Positive for sore throat. Negative for congestion.   Eyes: Negative for discharge and redness.  Respiratory: Negative for cough, hemoptysis, sputum production, shortness of breath and wheezing.   Cardiovascular: Positive for chest pain and leg swelling. Negative for palpitations, orthopnea, claudication and PND.  Gastrointestinal: Positive for heartburn. Negative for abdominal pain, blood in stool, melena, nausea and vomiting.  Genitourinary: Negative for hematuria.  Musculoskeletal: Negative for falls and myalgias.  Skin: Negative for rash.    Neurological: Positive for weakness. Negative for dizziness, tingling, tremors,  sensory change, speech change, focal weakness and loss of consciousness.  Endo/Heme/Allergies: Does not bruise/bleed easily.  Psychiatric/Behavioral: Negative for substance abuse. The patient is not nervous/anxious.   All other systems reviewed and are negative.   Labs:  Recent Labs  07/26/16 1015  TROPONINI <0.03   Lab Results  Component Value Date   WBC 6.5 07/26/2016   HGB 12.2 (L) 07/26/2016   HCT 35.4 (L) 07/26/2016   MCV 88.7 07/26/2016   PLT 196 07/26/2016     Recent Labs Lab 07/26/16 1015  NA 137  K 4.1  CL 107  CO2 21*  BUN 18  CREATININE 0.86  CALCIUM 9.6  GLUCOSE 202*   Lab Results  Component Value Date   CHOL 171 10/06/2015   HDL 20.50 (L) 10/06/2015   TRIG (H) 10/06/2015    574.0 Triglyceride is over 400; calculations on Lipids are invalid.   No results found for: DDIMER  Radiology/Studies:  Dg Chest 1 View  Result Date: 07/26/2016 CLINICAL DATA:  Chest pain.  CABG 2 months ago EXAM: CHEST 1 VIEW COMPARISON:  06/27/2016 FINDINGS: Chronic cardiopericardial enlargement. Status post CABG. Dual-chamber ICD/ pacer from the left in stable position. There is no edema, consolidation, effusion, or pneumothorax. IMPRESSION: Stable from prior.  No evidence of active disease. Electronically Signed   By: Monte Fantasia M.D.   On: 07/26/2016 11:08   Dg Chest 2 View  Result Date: 06/27/2016 CLINICAL DATA:  S/p CABG 05/24/2016; chest soreness at incision since procedure; some SOB with exertion; hx CHF, diabetic, pacemaker insertion x 8 years ago EXAM: CHEST  2 VIEW COMPARISON:  05/27/2016 FINDINGS: Stable changes from previous cardiac surgery. The left anterior chest wall AICD is well positioned and also stable. Cardiac silhouette is top-normal in size. No mediastinal or hilar masses or evidence of adenopathy. Clear lungs.  No pleural effusion or pneumothorax. Skeletal structures are  unremarkable. IMPRESSION: No acute cardiopulmonary disease. Electronically Signed   By: Lajean Manes M.D.   On: 06/27/2016 11:10    EKG: Interpreted by me showed: NSR, 82 bpm, right axis deviation, incomplete LBBB, nonspecific inferolateral st/t changes Telemetry: Interpreted by me showed: NSR, 80's bpm  Weights: Filed Weights   07/26/16 0955  Weight: 285 lb (129.3 kg)     Physical Exam: Blood pressure 101/67, pulse 81, temperature 98.2 F (36.8 C), temperature source Oral, resp. rate (!) 27, height 6' (1.829 m), weight 285 lb (129.3 kg), SpO2 95 %. Body mass index is 38.65 kg/m. General: Well developed, well nourished, in no acute distress. Head: Normocephalic, atraumatic, sclera non-icteric, no xanthomas, nares are without discharge.  Neck: Negative for carotid bruits. JVD not elevated. Lungs: Clear bilaterally to auscultation without wheezes, rales, or rhonchi. Breathing is unlabored. Heart: RRR with S1 S2. No murmurs, rubs, or gallops appreciated. Well healing surgical scar.  Abdomen: Obese, soft, non-tender, non-distended with normoactive bowel sounds. No hepatomegaly. No rebound/guarding. No obvious abdominal masses. Msk:  Strength and tone appear normal for age. Extremities: No clubbing or cyanosis. 1+ pitting edema to the knees. Distal pedal pulses are 2+ and equal bilaterally. Well healing surgical scars.  Neuro: Alert and oriented X 3. No facial asymmetry. No focal deficit. Moves all extremities spontaneously. Psych:  Responds to questions appropriately with a normal affect.    Assessment and Plan:  Principal Problem:   Chest pain with moderate risk for cardiac etiology Active Problems:   ICD (implantable cardioverter-defibrillator) in place   Congestive dilated cardiomyopathy (HCC)   Generalized anxiety  disorder   Chronic systolic heart failure ([place])   Coronary artery disease   Morbid obesity with BMI of 40.0-44.9, adult (HCC)   Hx of CABG    1. Chest pain with  moderate risk of cardiac etiology/CAD s/p CABG as above: -Currently symptom free and wants to go home -Will keep overnight and plan for nuclear stress test on 11/10 -Cycle troponin  -Continue ASA, decrease to 81 mg, and Plavix -Continue Coreg, Crestor, and Entresto -If needed could add Imdur or Ranexa -Recently went on a cruise 2 weeks ago and walked the entire ship for the week without any symptoms   2. ICM/acute on chronic systolic CHF s/p ICD: -Mild LE swelling, worse over the past week though improved from pre-bypass -Will give one-time dose of IV Lasix if BP allows (currently soft), likely resume PO Lasix on 11/10 -CHF education -Continue Coreg, Entresto, and spironolactone  3. History of VT s/p ablation: -No ectopy seen on tele -ICD in place -Continue Coreg -Follow up with EP  4. Anxiety: -He may be more acutely aware of his chest in the post-operative setting -Per IM   Signed, Christell Faith, PA-C [place] Pager: [phone] 07/26/2016, 12:12 PM

## 2016-07-26 NOTE — Consult Note (Signed)
[facility] [place] NAME: Brian Williams    MR#:  [id number]  DATE OF BIRTH:  [birth year]  DATE OF ADMISSION:  07/26/2016  PRIMARY CARE PHYSICIAN: [name], DO   REQUESTING/REFERRING PHYSICIAN: [place]  CHIEF COMPLAINT:   Chief Complaint  Patient presents with  . Chest Pain    HISTORY OF PRESENT ILLNESS: Brian Williams  is a 55 y.o. male with a known history of AICD, anxiety, back pain, cardiomyopathy, coronary artery disease status post cardiac bypass surgery 2 months ago, diabetes, bronchitis, hyperlipidemia, sleep apnea, ventricular tachycardia- he is having mid sternal chest pain on and off since he had surgery. He visited Dr. Rockey Situ in office 2 weeks ago where he suggested it is atypical chest pain and advised him not to go on a cruise ship trip, but patient still went up and walked a lot without any chest pain. When he went for cardiac rehabilitation he again had some burning chest pain in middle of his chest and some shortness of breath. Today morning he again had similar symptoms so he decided to come to emergency room. His symptoms are on and off and he was pain-free within a few minutes so cardiologist initially suggested to let him go home and have a stress test done tomorrow in the office but then he had complain of chest pain one more time so ER physician called me to come and see the patient for admission. When I saw the patient he was again pain-free and he was willing to go home and come tomorrow for the stress test. I discussed with Dr. Philmore Pali on the phone, he agreed to check second troponin and if it is negative then after ambulation patient stays pain-free then he can go home.  PAST MEDICAL HISTORY:   Past Medical History:  Diagnosis Date  . AICD (automatic cardioverter/defibrillator) present   . Anxiety    takes Xanax as needed  . Arthritis    back,knees,right shoulder  . Back pain   . Cardiomyopathy ([place])   . CHF  (congestive heart failure) (HCC)    takes Lasix and Aldactone daily  . Coronary artery disease    takes Plavix daily  . Depression    takes Zoloft daily  . Diabetes mellitus without complication (HCC)    Humulin R and Farxiga daily.Fasting blood sugar runs 140  . GERD (gastroesophageal reflux disease)   . Headache   . History of bronchitis   . History of colon polyps    benign  . History of hiatal hernia   . Hyperlipidemia    takes Fenofibrate,Crestor, and Zetia daily  . Hypertension    takes Entresto and Coreg daily  . MI (myocardial infarction) 2001  . Obesity   . Peripheral neuropathy ([place])   . Pneumonia    history of-last time about 14 yrs ago  . PONV (postoperative nausea and vomiting)    after knee surgery 25 yrs ago b/p stayed elevated for a while  . Presence of permanent cardiac pacemaker   . Shortness of breath dyspnea    with exertion  . Sleep apnea    uses CPAP nightly  . Ventricular tachycardia ([place])    s/p RFCA PVCs 2013    PAST SURGICAL HISTORY: Past Surgical History:  Procedure Laterality Date  . BACK SURGERY    . CARDIAC CATHETERIZATION    . CARDIAC CATHETERIZATION Left 05/10/2016   Procedure: Left Heart Cath and Coronary Angiography;  Surgeon: Kathlene November  Rockey Situ, MD;  Location: [facility];  Service: Cardiovascular;  Laterality: Left;  . CARDIAC DEFIBRILLATOR PLACEMENT  10/16/2007   ICD Model number 2207-36 serial number [id number]  . CARDIAC ELECTROPHYSIOLOGY STUDY AND ABLATION    . CHOLECYSTECTOMY  2001  . COLONOSCOPY    . CORONARY ANGIOPLASTY WITH STENT PLACEMENT     7 stents  . CORONARY ARTERY BYPASS GRAFT N/A 05/24/2016   Procedure: CORONARY ARTERY BYPASS GRAFTING (CABG) x four , using left internal mammary artery and left leg greater saphenous vein harvested endoscopically;  Surgeon: Gaye Pollack, MD;  Location: [facility];  Service: Open Heart Surgery;  Laterality: N/A;  . EP IMPLANTABLE DEVICE N/A 06/16/2015   Procedure: ICD Generator Changeout;   Surgeon: Deboraha Sprang, MD;  Location: [facility];  Service: Cardiovascular;  Laterality: N/A;  . ESOPHAGOGASTRODUODENOSCOPY    . INSERT / REPLACE / REMOVE PACEMAKER    . KNEE SURGERY     bilateral knee   . [name] WITHOUT CARDIOVERSION N/A 05/24/2016   Procedure: TRANSESOPHAGEAL ECHOCARDIOGRAM ([name]);  Surgeon: Gaye Pollack, MD;  Location: [facility];  Service: Open Heart Surgery;  Laterality: N/A;  . VASECTOMY      SOCIAL HISTORY:  Social History  Substance Use Topics  . Smoking status: Former Smoker    Packs/day: 0.50    Years: 25.00    Types: Cigarettes  . Smokeless tobacco: Never Used  . Alcohol use No    FAMILY HISTORY:  Family History  Problem Relation Age of Onset  . Heart attack Mother 64  . Hypertension Mother   . Heart attack Father 10  . Hypertension Father   . Hypertension Maternal Uncle   . Heart disease Maternal Uncle   . Heart disease Maternal Grandmother   . Stroke Maternal Grandmother     DRUG ALLERGIES:  Allergies  Allergen Reactions  . Morphine And Related Nausea And Vomiting  . Naproxen Other (See Comments)    Causes hyperactivity    REVIEW OF SYSTEMS:   CONSTITUTIONAL: No fever, fatigue or weakness.  EYES: No blurred or double vision.  EARS, NOSE, AND THROAT: No tinnitus or ear pain.  RESPIRATORY: No cough, shortness of breath, wheezing or hemoptysis.  CARDIOVASCULAR: Positive for chest pain, no orthopnea, edema.  GASTROINTESTINAL: No nausea, vomiting, diarrhea or abdominal pain.  GENITOURINARY: No dysuria, hematuria.  ENDOCRINE: No polyuria, nocturia,  HEMATOLOGY: No anemia, easy bruising or bleeding SKIN: No rash or lesion. MUSCULOSKELETAL: No joint pain or arthritis.   NEUROLOGIC: No tingling, numbness, weakness.  PSYCHIATRY: No anxiety or depression.   MEDICATIONS AT HOME:  Prior to Admission medications   Medication Sig Start Date End Date Taking? Authorizing Provider  acetaminophen (TYLENOL) 500 MG tablet Take 1,000 mg by mouth  every 6 (six) hours as needed.   Yes Historical Provider, MD  ALPRAZolam Duanne Moron) 1 MG tablet Take 1 tablet (1 mg total) by mouth 3 (three) times daily as needed. Patient taking differently: Take 1 mg by mouth 3 (three) times daily as needed for anxiety.  05/16/16  Yes [name], DO  aspirin EC 81 MG tablet Take 2 tablets (162 mg total) by mouth daily. 06/21/16  Yes Minna Merritts, MD  carvedilol (COREG) 12.5 MG tablet Take 1 tablet (12.5 mg total) by mouth 2 (two) times daily with a meal. Patient taking differently: Take 6.25 mg by mouth 2 (two) times daily with a meal.  05/30/16  Yes Donielle Liston Alba, PA-C  Cholecalciferol (VITAMIN  D) 2000 units tablet Take 2,000 Units by mouth daily.   Yes Historical Provider, MD  clopidogrel (PLAVIX) 75 MG tablet Take 1 tablet (75 mg total) by mouth daily. 06/21/16  Yes Minna Merritts, MD  dapagliflozin propanediol (FARXIGA) 5 MG TABS tablet Take 5 mg by mouth daily.  09/27/15  Yes Historical Provider, MD  ezetimibe (ZETIA) 10 MG tablet Take 1 tablet (10 mg total) by mouth daily. 12/02/15  Yes Minna Merritts, MD  fenofibrate (TRICOR) 145 MG tablet Take 145 mg by mouth daily.  08/12/14  Yes Historical Provider, MD  furosemide (LASIX) 40 MG tablet Take 40 mg by mouth daily.  09/13/14  Yes Historical Provider, MD  gemfibrozil (LOPID) 600 MG tablet Take 1 tablet (600 mg total) by mouth daily. 02/17/16  Yes [name], DO  Oxycodone HCl 10 MG TABS Take 1 tablet (10 mg total) by mouth every 4 (four) hours as needed. 07/04/16  Yes Gaye Pollack, MD  potassium chloride (KLOR-CON M10) 10 MEQ tablet Take 10 mEq by mouth daily.  09/02/14  Yes Historical Provider, MD  rosuvastatin (CRESTOR) 40 MG tablet Take 1 tablet (40 mg total) by mouth daily. 08/08/15  Yes Minna Merritts, MD  sacubitril-valsartan (ENTRESTO) 49-51 MG Take 1 tablet by mouth 2 (two) times daily. 06/28/16  Yes Minna Merritts, MD  sertraline (ZOLOFT) 100 MG tablet TAKE 1 TABLET (100 MG TOTAL) BY  MOUTH DAILY. 07/25/16  Yes [name], DO  spironolactone (ALDACTONE) 25 MG tablet Take 1 tablet (25 mg total) by mouth daily. 01/23/16 01/22/17 Yes Minna Merritts, MD  insulin regular human CONCENTRATED (HUMULIN R) 500 UNIT/ML injection Continuous rate plus boluses on a sliding scale. 12/02/14   Historical Provider, MD      PHYSICAL EXAMINATION:   VITAL SIGNS: Blood pressure 101/67, pulse 81, temperature 98.2 F (36.8 C), temperature source Oral, resp. rate (!) 27, height 6' (1.829 m), weight 129.3 kg (285 lb), SpO2 95 %.  GENERAL:  55 y.o.-year-old patient lying in the bed with no acute distress.  EYES: Pupils equal, round, reactive to light and accommodation. No scleral icterus. Extraocular muscles intact.  HEENT: Head atraumatic, normocephalic. Oropharynx and nasopharynx clear.  NECK:  Supple, no jugular venous distention. No thyroid enlargement, no tenderness.  LUNGS: Normal breath sounds bilaterally, no wheezing, rales,rhonchi or crepitation. No use of accessory muscles of respiration.  CARDIOVASCULAR: S1, S2 normal. No murmurs, rubs, or gallops. Surgical scar present in the mid chest. ABDOMEN: Soft, nontender, nondistended. Bowel sounds present. No organomegaly or mass.  EXTREMITIES: No pedal edema, cyanosis, or clubbing.  NEUROLOGIC: Cranial nerves II through XII are intact. Muscle strength 5/5 in all extremities. Sensation intact. Gait not checked.  PSYCHIATRIC: The patient is alert and oriented x 3.  SKIN: No obvious rash, lesion, or ulcer.   LABORATORY PANEL:   CBC  Recent Labs Lab 07/26/16 1015  WBC 6.5  HGB 12.2*  HCT 35.4*  PLT 196  MCV 88.7  MCH 30.6  MCHC 34.5  RDW 14.8*   ------------------------------------------------------------------------------------------------------------------  Chemistries   Recent Labs Lab 07/26/16 1015  NA 137  K 4.1  CL 107  CO2 21*  GLUCOSE 202*  BUN 18  CREATININE 0.86  CALCIUM 9.6    ------------------------------------------------------------------------------------------------------------------ estimated creatinine clearance is 134.9 mL/min (by C-G formula based on SCr of 0.86 mg/dL). ------------------------------------------------------------------------------------------------------------------ No results for input(s): TSH, T4TOTAL, T3FREE, THYROIDAB in the last 72 hours.  Invalid input(s): FREET3   Coagulation profile No  results for input(s): INR, PROTIME in the last 168 hours. ------------------------------------------------------------------------------------------------------------------- No results for input(s): DDIMER in the last 72 hours. -------------------------------------------------------------------------------------------------------------------  Cardiac Enzymes  Recent Labs Lab 07/26/16 1015  TROPONINI <0.03   ------------------------------------------------------------------------------------------------------------------ Invalid input(s): POCBNP  ---------------------------------------------------------------------------------------------------------------  Urinalysis    Component Value Date/Time   COLORURINE YELLOW 05/22/2016 1519   APPEARANCEUR CLEAR 05/22/2016 1519   LABSPEC 1.036 (H) 05/22/2016 1519   PHURINE 5.5 05/22/2016 1519   GLUCOSEU >1000 (A) 05/22/2016 1519   HGBUR NEGATIVE 05/22/2016 1519   BILIRUBINUR NEGATIVE 05/22/2016 1519   KETONESUR NEGATIVE 05/22/2016 1519   PROTEINUR NEGATIVE 05/22/2016 1519   NITRITE NEGATIVE 05/22/2016 1519   LEUKOCYTESUR NEGATIVE 05/22/2016 1519     RADIOLOGY: Dg Chest 1 View  Result Date: 07/26/2016 CLINICAL DATA:  Chest pain.  CABG 2 months ago EXAM: CHEST 1 VIEW COMPARISON:  06/27/2016 FINDINGS: Chronic cardiopericardial enlargement. Status post CABG. Dual-chamber ICD/ pacer from the left in stable position. There is no edema, consolidation, effusion, or pneumothorax. IMPRESSION:  Stable from prior.  No evidence of active disease. Electronically Signed   By: Monte Fantasia M.D.   On: 07/26/2016 11:08    EKG: Orders placed or performed during the hospital encounter of 07/26/16  . ED EKG within 10 minutes  . ED EKG within 10 minutes    IMPRESSION AND PLAN:  * History of coronary artery disease and status post CABG and AICD   Atypical chest pain   Hypertension, hyperlipidemia, diabetes    First troponin is negative in ER   EKG is unchanged compared to previous EKG.   Patient is currently pain-free.   I spoke to Dr. Rockey Situ on phone again.   He suggested to check second troponin and if negative then after ambulation if patient remains pain free he can go home.   He agreed that most likely patient's pain is musculoskeletal secondary to recent cardiac surgery.  All the records are reviewed and case discussed with ED provider. Management plans discussed with the patient, family and they are in agreement.  CODE STATUS: Full code Code Status History    Date Active Date Inactive Code Status Order ID Comments User Context   05/24/2016  2:19 PM 05/30/2016  5:51 PM Full Code [id number]  Nani Skillern, PA-C Inpatient   06/16/2015  6:00 PM 06/16/2015 11:11 PM Full Code [id number]  Deboraha Sprang, MD Inpatient       TOTAL TIME TAKING CARE OF THIS PATIENT:  50 minutes.    Vaughan Basta M.D on 07/26/2016   Between 7am to 6pm - Pager - [phone]  After 6pm go to [email or web address] - password [email or web address] [facility]  Office  [phone]  CC: Primary care physician; [name], DO   Note: This dictation was prepared with Dragon dictation along with smaller phrase technology. Any transcriptional errors that result from this process are unintentional.

## 2016-07-26 NOTE — ED Notes (Signed)
Pt states he will stay/admit and have stress test done tomorrow. Pt given meal tray..sitting up at the bedside.Marland Kitchen

## 2016-07-26 NOTE — Progress Notes (Signed)
Daily Session Note  Patient Details  Name: Brian Williams MRN: [id number] Date of Birth: 01/23/1961 Referring Provider:   Flowsheet Row Cardiac Rehab from 07/16/2016 in [facility]  Referring Provider  Gollan, Timothy MC      Encounter Date: 07/26/2016  Check In:     Session Check In - 07/26/16 0847      Check-In   Location [facility]   Staff Present Carroll Enterkin, RN, BSN;Jessica Hawkins, MA, ACSM RCEP, Exercise Physiologist;Amanda Sommer, BA, ACSM CEP, Exercise Physiologist   Supervising physician immediately available to respond to emergencies See telemetry face sheet for immediately available ER MD   Medication changes reported     No   Fall or balance concerns reported    No   Warm-up and Cool-down Performed on first and last piece of equipment   Resistance Training Performed Yes   VAD Patient? No     Pain Assessment   Currently in Pain? No/denies         Goals Met:  Independence with exercise equipment Exercise tolerated well No report of cardiac concerns or symptoms Strength training completed today  Goals Unmet:  Not Applicable  Comments: First full day of exercise!  Patient was oriented to gym and equipment including functions, settings, policies, and procedures.  Patient's individual exercise prescription and treatment plan were reviewed.  All starting workloads were established based on the results of the 6 minute walk test done at initial orientation visit.  The plan for exercise progression was also introduced and progression will be customized based on patient's performance and goals.    Dr. Mark Miller is Medical Director for [facility] and [facility]. 

## 2016-07-26 NOTE — Progress Notes (Signed)
Daily Session Note  Patient Details  Name: Brian Williams MRN: [id number] Date of Birth: 03/05/1961 Referring Provider:   Flowsheet Row Cardiac Rehab from 07/16/2016 in [facility]  Referring Provider  Ida Rogue [facility]      Encounter Date: 07/26/2016  Check In:     Session Check In - 07/26/16 0926      Pain Assessment   Currently in Pain? Yes   Pain Score 6    Pain Location Neck   Pain Descriptors / Indicators Constant   Pain Type Chronic pain         Goals Met:  Independence with exercise equipment  Goals Unmet:  Not Applicable  Comments: neck and chest pain with exercise today and he said chronic. I have asked [name] to go to Dr. Donivan Scull office today.    Dr. Emily Filbert is Medical Director for [place] and [facility].

## 2016-07-26 NOTE — ED Triage Notes (Signed)
Pt brought over from cardiac rehab with reports that he is 2 months post CABG, for 3 weeks he has been having "burning sensation" to chest ONLY with exertion and sob. Pt denies chest pain while in triage. Pt in NAD at this time.

## 2016-07-26 NOTE — ED Notes (Signed)
[facility] moved pts car to ED parking lot. Valet delivered keys to this RN who gave keys to pt and informed him of car being moved to ED lot.

## 2016-07-27 ENCOUNTER — Ambulatory Visit
Admission: RE | Admit: 2016-07-27 | Discharge: 2016-07-27 | Disposition: A | Discharge status: Home / Self Care | Payer: BLUE CROSS/BLUE SHIELD | Source: Ambulatory Visit | Attending: Physician Assistant | Admitting: Physician Assistant

## 2016-07-27 ENCOUNTER — Ambulatory Visit: Payer: BLUE CROSS/BLUE SHIELD | Admitting: Cardiovascular Disease

## 2016-07-27 ENCOUNTER — Encounter: Payer: Self-pay | Admitting: *Deleted

## 2016-07-27 ENCOUNTER — Other Ambulatory Visit: Payer: BLUE CROSS/BLUE SHIELD

## 2016-07-27 ENCOUNTER — Telehealth: Payer: Self-pay | Admitting: Cardiovascular Disease

## 2016-07-27 DIAGNOSIS — R079 Chest pain, unspecified: Secondary | ICD-10-CM

## 2016-07-27 NOTE — Telephone Encounter (Signed)
Pt's ETT myoview was cancelled this am, as pt had a cup of coffee prior to his procedure. Pt has appt today @ 3:40 to go over these results. Left message on pt's vm for him to call back to cancel this afternoon's appt and go over instructions for rescheduled test on Monday am.

## 2016-07-27 NOTE — Telephone Encounter (Signed)
Spoke w/ pt.  Reviewed instructions for pt's ETT Myoview on Monday, 11/13. He verbalizes understanding to arrive @ the [facility] @ 9:15, be NPO after midnight w/ exception of sip of water to take am meds, hold carvedilol the pm before & am of procedure, no caffeine or nicotine 24 hrs prior to procedure and to wear comfortable shoes. Cancelled this afternoon's appt.  Pt states that he would prefer a call w/ results instead of making an appt.  Pt verbalizes considerable anxiety about having to wait through the weekend for his test, though he states that he does not feel that his sx are cardiac related. Pt states that he believes his body is still healing from the CABG; he only reports SOBOE, does not have pain or burning sensation that he had prior to procedure.  He states that he is 2 months out from procedure and is "feeling frazzled". Discussed pt's feelings w/ him & the reasons why he feels anxious.  He is much calmer at the end of our conversation and appreciative of the call. Asked him to call back w/ any further questions or concerns.

## 2016-07-30 ENCOUNTER — Encounter
Admission: RE | Admit: 2016-07-30 | Discharge: 2016-07-30 | Disposition: A | Discharge status: Home / Self Care | Payer: BLUE CROSS/BLUE SHIELD | Source: Ambulatory Visit | Attending: Physician Assistant | Admitting: Physician Assistant

## 2016-07-30 DIAGNOSIS — R079 Chest pain, unspecified: Secondary | ICD-10-CM | POA: Diagnosis not present

## 2016-07-30 LAB — NM MYOCAR MULTI W/SPECT W/WALL MOTION / EF
LV dias vol: 238 mL (ref 62–150)
LV sys vol: 160 mL
Peak HR: 105 {beats}/min
Percent HR: 63 %
Rest HR: 88 {beats}/min
SDS: 0
SRS: 14
SSS: 3
TID: 0.97

## 2016-07-30 MED ORDER — TECHNETIUM TC 99M TETROFOSMIN IV KIT
13.0000 | PACK | Freq: Once | INTRAVENOUS | Status: AC | PRN
  Administered 2016-07-30: 14.06 via INTRAVENOUS

## 2016-07-30 MED ORDER — TECHNETIUM TC 99M TETROFOSMIN IV KIT
33.0000 | PACK | Freq: Once | INTRAVENOUS | Status: AC | PRN
  Administered 2016-07-30: 31.39 via INTRAVENOUS

## 2016-07-30 MED ORDER — REGADENOSON 0.4 MG/5ML IV SOLN
0.4000 mg | Freq: Once | INTRAVENOUS | Status: AC
Start: 2016-07-30 — End: 2016-07-30
  Administered 2016-07-30: 0.4 mg via INTRAVENOUS

## 2016-07-31 ENCOUNTER — Telehealth: Payer: Self-pay | Admitting: Cardiovascular Disease

## 2016-07-31 NOTE — Telephone Encounter (Signed)
Pt would like stress test results. Please call. 

## 2016-08-01 ENCOUNTER — Telehealth: Payer: Self-pay | Admitting: *Deleted

## 2016-08-01 NOTE — Telephone Encounter (Signed)
Stress test shows no ischemia Previous damage seen to the inferior wall, fixed defect,  no new blockages suggested.

## 2016-08-01 NOTE — Telephone Encounter (Signed)
Landrum called to say that he had a stress test, but has not heard back from the results yet.  I sent an in basket message for clearance to return.

## 2016-08-02 ENCOUNTER — Telehealth: Payer: Self-pay | Admitting: *Deleted

## 2016-08-02 NOTE — Telephone Encounter (Signed)
Reviewed results w/ pt.  He verbalizes understanding, though he does ask why he is still experiencing chest pain if there are no blockages.

## 2016-08-02 NOTE — Telephone Encounter (Signed)
-----   Message from Minna Merritts, MD sent at 08/01/2016 10:02 PM EST ----- Regarding: RE: Clearance to return to rehab Stress test is fine, no ischemia thx Brian Williams  ----- Message ----- From: Clotilde Dieter Sent: 08/01/2016   1:57 PM To: Minna Merritts, MD Subject: Clearance to return to rehab                   Dr. Lyda Perone had a stress test for chest pain on 11/13.  When can he come back to rehab?    Also, he has called to find out the results of his test.  Can you please let him know the results?  Thanks for your help! Alberteen Sam, MA, ACSM RCEP 08/01/2016 2:01 PM

## 2016-08-03 ENCOUNTER — Encounter: Payer: Self-pay | Admitting: Dietician

## 2016-08-14 ENCOUNTER — Encounter: Payer: BLUE CROSS/BLUE SHIELD | Admitting: *Deleted

## 2016-08-14 ENCOUNTER — Ambulatory Visit (INDEPENDENT_AMBULATORY_CARE_PROVIDER_SITE_OTHER): Payer: BLUE CROSS/BLUE SHIELD | Admitting: Cardiovascular Disease

## 2016-08-14 ENCOUNTER — Encounter: Payer: Self-pay | Admitting: Cardiovascular Disease

## 2016-08-14 VITALS — BP 98/62 | HR 77 | Ht 72.0 in | Wt 284.5 lb

## 2016-08-14 DIAGNOSIS — I420 Dilated cardiomyopathy: Secondary | ICD-10-CM | POA: Diagnosis not present

## 2016-08-14 DIAGNOSIS — Z9989 Dependence on other enabling machines and devices: Secondary | ICD-10-CM

## 2016-08-14 DIAGNOSIS — I10 Essential (primary) hypertension: Secondary | ICD-10-CM | POA: Diagnosis not present

## 2016-08-14 DIAGNOSIS — I5022 Chronic systolic (congestive) heart failure: Secondary | ICD-10-CM

## 2016-08-14 DIAGNOSIS — E1142 Type 2 diabetes mellitus with diabetic polyneuropathy: Secondary | ICD-10-CM | POA: Diagnosis not present

## 2016-08-14 DIAGNOSIS — I208 Other forms of angina pectoris: Secondary | ICD-10-CM | POA: Diagnosis not present

## 2016-08-14 DIAGNOSIS — Z951 Presence of aortocoronary bypass graft: Secondary | ICD-10-CM

## 2016-08-14 DIAGNOSIS — G4733 Obstructive sleep apnea (adult) (pediatric): Secondary | ICD-10-CM

## 2016-08-14 DIAGNOSIS — Z6841 Body Mass Index (BMI) 40.0 and over, adult: Secondary | ICD-10-CM

## 2016-08-14 DIAGNOSIS — E7800 Pure hypercholesterolemia, unspecified: Secondary | ICD-10-CM

## 2016-08-14 LAB — GLUCOSE, CAPILLARY: Glucose-Capillary: 210 mg/dL — ABNORMAL HIGH (ref 65–99)

## 2016-08-14 NOTE — Progress Notes (Signed)
Daily Session Note  Patient Details  Name: Josh Nicolosi MRN: [id number] Date of Birth: [birth year] Referring Provider:   Flowsheet Row Cardiac Rehab from 07/16/2016 in [facility]  Referring Provider  Ida Rogue [facility]      Encounter Date: 08/14/2016  Check In:     Session Check In - 08/14/16 0846      Check-In   Location [facility]   Staff Present Alberteen Sam, MA, ACSM RCEP, Exercise Physiologist;Susanne Bice, RN, BSN, CCRP;Laureen Owens Shark, BS, RRT, Respiratory Therapist   Supervising physician immediately available to respond to emergencies See telemetry face sheet for immediately available ER MD   Medication changes reported     No   Fall or balance concerns reported    No   Warm-up and Cool-down Performed on first and last piece of equipment   Resistance Training Performed Yes     Pain Assessment   Currently in Pain? Yes   Pain Score 0-No pain   Pain Location Neck   Pain Orientation Mid   Pain Descriptors / Indicators Burning   Pain Type Chronic pain         Goals Met:  Independence with exercise equipment Exercise tolerated well No report of cardiac concerns or symptoms Strength training completed today  Goals Unmet:  Not Applicable  Comments: Pt able to follow exercise prescription today without complaint.  Will continue to monitor for progression.    Dr. Emily Filbert is Medical Director for [place] and [facility].

## 2016-08-14 NOTE — Progress Notes (Signed)
Cardiology Office Note  Date:  08/14/2016   ID:  Brian Williams, DOB Nov 04, 1960, MRN [id number]  PCP:  Brian Spikes, DO   Chief Complaint  Patient presents with  . other    F/u ED due to chest pain. Meds reviewed verbally with pt.    HPI:  Brian Williams is a pleasant 55 year old gentleman with history of coronary artery disease, long history of smoking, diabetes type 2 poorly controlled with hemoglobin A1c of 9, followed by endocrine/Dr. Gabriel Williams, prior history of cardiac catheterization 12 dating back to 2002, ischemic cardiomyopathy, echocardiogram March 2016 showing ejection fraction 20-25%, who presents for routine follow-up of his coronary artery disease, Follow-up after CABG, September 2017 History of sustained VT, VT ablation in [place] at [place] of sleep apnea, on CPAP, recent generator change in 2016, ICD in place,  previously seen by seen by Brian Williams cardiology heart failure/transplant team, Brian Williams.   CABG By Dr. Cyndia Williams September 2017Left internal mammary graft to the LAD SVG to diagonal, SVG to OM, SVG to PDA Reports that he felt well for one month up to 6 weeks with no burning or chest pain symptoms. He reports he went on a cruise without any incidents Following which she developed chest burning symptoms on exertion similar to previous stable anginal symptoms  He has been participating in cardiac rehabilitation but only for several times, reports having some chest discomfort, burning when he exerts himself He has been back in the emergency room, workup was negative Had stress test recently showing no ischemia, old inferior wall scar  By our records in the office, weight has been relatively stable Down 15 pounds from following his CABG taking regular doses of Lasix 40 mg daily He is on aspirin Plavix  LDL 65, total chol 171 Previous hemoglobin A1c of 9,   Previous cardiac catheterization results  cath on 05/10/2016 showing a 70% eccentric distal LM stenosis,  80% proximal LAD in-stent restenosis, 80% diagonal stenosis, and tandem 90% proximal and mid RCA stenoses with mild in-stent narrowing. The LVEF is visually 35-45%  EKG shows normal sinus rhythm with rate 77 bpm, nonspecific ST  and T wave abnormality, old inferior MI , consider old anteroseptal MI, No significant change in EKG compared to following his bypass surgery  other past medical history  Previously reported having memory issues  Feels he is forgetting things, losing attention, poor focus Wife has to continually her mind him about things, his own children have noticed he is forgetful For example he reports when he is driving, often goes wrong direction, forgets to take the turn that he is supposed to [facility] who to talk to about these new symptoms Reports his sleep is poor, sometimes sleeping well, most often sleeping very poorly, only several hours at a time  Denies having any significant change in his symptoms with decreased amiodarone down to 100 mg daily, denies worsening palpitations or tachycardia, no ICD shock/therapy  Previously seen in [place] for his cardiac issues. History dates back to 2002 when he had distal RCA disease. Several catheterizations over the next several years for in-stent restenosis of the distal RCA. Later cath, stent placed to the LAD. Most recent catheterization appears to be April 2011 showing patent LAD and RCA stent.  Despite this, he has had a decline in his ejection fraction over the past several years requiring defibrillator for ventricular tachycardia. Arrhythmia seems to be controlled on nadolol (was previously on 80 mg twice a day, decreased down to daily secondary  to low blood pressure and fatigue)  only had one shock since the device was placed.   seen by Brian Williams cardiology heart failure/transplant team, Brian Williams. No medication changes at that time. Creatinine was elevated with BUN, improved on subsequent lab work.   Previous  cholesterol per his notes July 2015 showing total cholesterol 185, HDL 19, triglycerides 800, this was on Crestor 40, fenofibrate  PMH:   has a past medical history of AICD (automatic cardioverter/defibrillator) present; Anxiety; Arthritis; Back pain; Cardiomyopathy ([place]); CHF (congestive heart failure) ([place]); Coronary artery disease; Depression; Diabetes mellitus without complication ([place]); GERD (gastroesophageal reflux disease); Headache; History of bronchitis; History of colon polyps; History of hiatal hernia; Hyperlipidemia; Hypertension; MI (myocardial infarction) (2001); Obesity; Peripheral neuropathy ([place]); Pneumonia; PONV (postoperative nausea and vomiting); Presence of permanent cardiac pacemaker; Shortness of breath dyspnea; Sleep apnea; and Ventricular tachycardia ([place]).  PSH:    Past Surgical History:  Procedure Laterality Date  . BACK SURGERY    . CARDIAC CATHETERIZATION    . CARDIAC CATHETERIZATION Left 05/10/2016   Procedure: Left Heart Cath and Coronary Angiography;  Surgeon: Brian Merritts, MD;  Location: [facility];  Service: Cardiovascular;  Laterality: Left;  . CARDIAC DEFIBRILLATOR PLACEMENT  10/16/2007   ICD Model number 2207-36 serial number [id number]  . CARDIAC ELECTROPHYSIOLOGY STUDY AND ABLATION    . CHOLECYSTECTOMY  2001  . COLONOSCOPY    . CORONARY ANGIOPLASTY WITH STENT PLACEMENT     7 stents  . CORONARY ARTERY BYPASS GRAFT N/A 05/24/2016   Procedure: CORONARY ARTERY BYPASS GRAFTING (CABG) x four , using left internal mammary artery and left leg greater saphenous vein harvested endoscopically;  Surgeon: Brian Pollack, MD;  Location: [facility];  Service: Open Heart Surgery;  Laterality: N/A;  . EP IMPLANTABLE DEVICE N/A 06/16/2015   Procedure: ICD Generator Changeout;  Surgeon: Brian Sprang, MD;  Location: [facility];  Service: Cardiovascular;  Laterality: N/A;  . ESOPHAGOGASTRODUODENOSCOPY    . INSERT / REPLACE / REMOVE PACEMAKER    . KNEE SURGERY      bilateral knee   . [name] WITHOUT CARDIOVERSION N/A 05/24/2016   Procedure: TRANSESOPHAGEAL ECHOCARDIOGRAM ([name]);  Surgeon: Brian Pollack, MD;  Location: [place] [facility];  Service: Open Heart Surgery;  Laterality: N/A;  . VASECTOMY      Current Outpatient Prescriptions  Medication Sig Dispense Refill  . acetaminophen (TYLENOL) 500 MG tablet Take 1,000 mg by mouth every 6 (six) hours as needed.    . ALPRAZolam (XANAX) 1 MG tablet Take 1 tablet (1 mg total) by mouth 3 (three) times daily as needed. (Patient taking differently: Take 1 mg by mouth 3 (three) times daily as needed for anxiety. ) 90 tablet 2  . aspirin EC 81 MG tablet Take 2 tablets (162 mg total) by mouth daily. 90 tablet   . carvedilol (COREG) 12.5 MG tablet Take 1 tablet (12.5 mg total) by mouth 2 (two) times daily with a meal. (Patient taking differently: Take 6.25 mg by mouth 2 (two) times daily with a meal. ) 60 tablet 1  . Cholecalciferol (VITAMIN D) 2000 units tablet Take 2,000 Units by mouth daily.    . clopidogrel (PLAVIX) 75 MG tablet Take 1 tablet (75 mg total) by mouth daily. 90 tablet 3  . dapagliflozin propanediol (FARXIGA) 5 MG TABS tablet Take 5 mg by mouth daily.     Marland Kitchen ezetimibe (ZETIA) 10 MG tablet Take 1 tablet (10 mg total) by mouth daily. 90 tablet 4  .  fenofibrate (TRICOR) 145 MG tablet Take 145 mg by mouth daily.     . furosemide (LASIX) 40 MG tablet Take 40 mg by mouth daily.     Marland Kitchen gemfibrozil (LOPID) 600 MG tablet Take 1 tablet (600 mg total) by mouth daily. 90 tablet 3  . insulin regular human CONCENTRATED (HUMULIN R) 500 UNIT/ML injection Continuous rate plus boluses on a sliding scale.    . Oxycodone HCl 10 MG TABS Take 1 tablet (10 mg total) by mouth every 4 (four) hours as needed. 30 tablet 0  . potassium chloride (KLOR-CON M10) 10 MEQ tablet Take 10 mEq by mouth daily.     . rosuvastatin (CRESTOR) 40 MG tablet Take 1 tablet (40 mg total) by mouth daily. 90 tablet 3  . sacubitril-valsartan (ENTRESTO) 49-51 MG Take 1  tablet by mouth 2 (two) times daily. 60 tablet 6  . sertraline (ZOLOFT) 100 MG tablet TAKE 1 TABLET (100 MG TOTAL) BY MOUTH DAILY. 30 tablet 2  . spironolactone (ALDACTONE) 25 MG tablet Take 1 tablet (25 mg total) by mouth daily. 90 tablet 3   No current facility-administered medications for this visit.      Allergies:   Morphine and related and Naproxen   Social History:  The patient  reports that he has quit smoking. His smoking use included Cigarettes. He has a 12.50 pack-year smoking history. He has never used smokeless tobacco. He reports that he does not drink alcohol or use drugs.   Family History:   family history includes Heart attack (age of onset: 33) in his mother; Heart attack (age of onset: 98) in his father; Heart disease in his maternal grandmother and maternal uncle; Hypertension in his father, maternal uncle, and mother; Stroke in his maternal grandmother.    Review of Systems: Review of Systems  Constitutional: Negative.   Respiratory: Negative.   Cardiovascular: Positive for chest pain.       Chest burning on exertion  Gastrointestinal: Negative.   Musculoskeletal: Negative.   Neurological: Negative.   Psychiatric/Behavioral: Negative.   All other systems reviewed and are negative.    PHYSICAL EXAM: VS:  BP 98/62 (BP Location: Left Arm, Patient Position: Sitting, Cuff Size: Large)   Pulse 77   Ht 6' (1.829 m)   Wt 284 lb 8 oz (129 kg)   BMI 38.59 kg/m  , BMI Body mass index is 38.59 kg/m. GEN: Well nourished, well developed, in no acute distress, obese  HEENT: normal  Neck: no JVD, carotid bruits, or masses Cardiac: RRR; no murmurs, rubs, or gallops,no edema  Respiratory:  clear to auscultation bilaterally, normal work of breathing GI: soft, nontender, nondistended, + BS MS: no deformity or atrophy , well healed mediastinal scar Skin: warm and dry, no rash Neuro:  Strength and sensation are intact Psych: euthymic mood, full affect    Recent  Labs: 05/22/2016: ALT 24 05/25/2016: Magnesium 2.5 07/26/2016: BUN 18; Creatinine, Ser 0.86; Hemoglobin 12.2; Platelets 196; Potassium 4.1; Sodium 137    Lipid Panel Lab Results  Component Value Date   CHOL 171 10/06/2015   HDL 20.50 (L) 10/06/2015   TRIG (H) 10/06/2015    574.0 Triglyceride is over 400; calculations on Lipids are invalid.      Wt Readings from Last 3 Encounters:  08/14/16 284 lb 8 oz (129 kg)  07/26/16 285 lb (129.3 kg)  07/26/16 285 lb 9.6 oz (129.5 kg)       ASSESSMENT AND PLAN:  Congestive dilated cardiomyopathy ([place]) - Plan:  EKG 12-Lead Severely depressed ejection fraction, at his baseline Old inferior wall MI, post bypass Weight stable by our scales No medication changes made  Essential hypertension - Plan: EKG 12-Lead Low blood pressure, will continue current medications Asymptomatic  Chronic systolic heart failure ([place]) - Plan: EKG 12-Lead We'll continue entresto, coreg, lasix  Stable angina ([facility]) Long discussion with him concerning symptoms He is inclined not to proceed with catheterization at this time Certainly catheterization could be done if symptoms get worse Encouraged him to continue his cardiac rehabilitation We will start ranexa 500 mg twice a day with titration up to 1000 mg twice a day in one week  Morbid obesity with BMI of 40.0-44.9, adult ([place]) We have encouraged continued exercise, careful diet management in an effort to lose weight.  Hx of CABG Surgical details discussed with him Unclear why he felt well for 4-6 weeks, now having symptoms of chest burning Again as above, catheterization was offered He prefers to try ranexa first Suggest he continue cardiac rehabilitation  OSA on CPAP Recommended compliance with his CPAP Pure hypercholesterolemia   Total encounter time more than 25 minutes  Greater than 50% was spent in counseling and coordination of care with the patient  Disposition:   F/U  6 months   Orders  Placed This Encounter  Procedures  . EKG 12-Lead     Signed, Esmond Plants, M.D., Ph.D. 08/14/2016  [place], [place]

## 2016-08-14 NOTE — Patient Instructions (Addendum)
Medication Instructions:   Please start ranexa 500 mg twice a day for one week Then up to 1000 mg twice a day  Labwork:  No new labs needed  Testing/Procedures:  No further testing at this time   I recommend watching educational videos on topics of interest to you at:       [email or web address]  Enter code: HEARTCARE    Follow-Up: It was a pleasure seeing you in the office today. Please call us if you have new issues that need to be addressed before your next appt.  [phone]  Your physician wants you to follow-up in: 1 month.   If you need a refill on your cardiac medications before your next appointment, please call your pharmacy.

## 2016-08-16 ENCOUNTER — Other Ambulatory Visit: Payer: Self-pay | Admitting: Family Medicine

## 2016-08-16 DIAGNOSIS — F329 Major depressive disorder, single episode, unspecified: Secondary | ICD-10-CM

## 2016-08-16 DIAGNOSIS — F32A Depression, unspecified: Secondary | ICD-10-CM

## 2016-08-16 DIAGNOSIS — F419 Anxiety disorder, unspecified: Secondary | ICD-10-CM

## 2016-08-16 MED ORDER — SERTRALINE HCL 100 MG PO TABS: 200.0000 mg | ORAL_TABLET | Freq: Every day | ORAL | 1 refills | Status: DC

## 2016-08-17 ENCOUNTER — Other Ambulatory Visit: Payer: Self-pay | Admitting: Family Medicine

## 2016-08-17 DIAGNOSIS — F329 Major depressive disorder, single episode, unspecified: Secondary | ICD-10-CM

## 2016-08-17 DIAGNOSIS — F32A Depression, unspecified: Secondary | ICD-10-CM

## 2016-08-17 DIAGNOSIS — F419 Anxiety disorder, unspecified: Secondary | ICD-10-CM

## 2016-08-20 ENCOUNTER — Telehealth: Payer: Self-pay | Admitting: *Deleted

## 2016-08-20 ENCOUNTER — Encounter: Payer: Self-pay | Admitting: *Deleted

## 2016-08-20 DIAGNOSIS — Z951 Presence of aortocoronary bypass graft: Secondary | ICD-10-CM

## 2016-08-20 NOTE — Telephone Encounter (Signed)
Brian Williams called to let us know that he will need to drop from the program.  He was notified on Friday, that he no longer will be covered under his insurance.  Thus, he cannot afford the program.  He requested information about what to do at home.  We talked about walking at home and I told him that I would send him our home exercise packet.

## 2016-08-20 NOTE — Progress Notes (Signed)
Cardiac Individual Treatment Plan  Patient Details  Name: Brian Williams MRN: [id number] Date of Birth: 1961-08-12 Referring Provider:   Flowsheet Row Cardiac Rehab from 07/16/2016 in [facility] Cardiac and Pulmonary Rehab  Referring Provider  Ida Rogue [facility]      Initial Encounter Date:  Flowsheet Row Cardiac Rehab from 07/16/2016 in [facility] Cardiac and Pulmonary Rehab  Date  07/16/16  Referring Provider  Ida Rogue [facility]      Visit Diagnosis: S/P CABG x 4  Patient's Home Medications on Admission:  Current Outpatient Prescriptions:  .  acetaminophen (TYLENOL) 500 MG tablet, Take 1,000 mg by mouth every 6 (six) hours as needed., Disp: , Rfl:  .  ALPRAZolam (XANAX) 1 MG tablet, Take 1 tablet (1 mg total) by mouth 3 (three) times daily as needed. (Patient taking differently: Take 1 mg by mouth 3 (three) times daily as needed for anxiety. ), Disp: 90 tablet, Rfl: 2 .  aspirin EC 81 MG tablet, Take 2 tablets (162 mg total) by mouth daily., Disp: 90 tablet, Rfl:  .  carvedilol (COREG) 12.5 MG tablet, Take 1 tablet (12.5 mg total) by mouth 2 (two) times daily with a meal. (Patient taking differently: Take 6.25 mg by mouth 2 (two) times daily with a meal. ), Disp: 60 tablet, Rfl: 1 .  Cholecalciferol (VITAMIN D) 2000 units tablet, Take 2,000 Units by mouth daily., Disp: , Rfl:  .  clopidogrel (PLAVIX) 75 MG tablet, Take 1 tablet (75 mg total) by mouth daily., Disp: 90 tablet, Rfl: 3 .  dapagliflozin propanediol (FARXIGA) 5 MG TABS tablet, Take 5 mg by mouth daily. , Disp: , Rfl:  .  ezetimibe (ZETIA) 10 MG tablet, Take 1 tablet (10 mg total) by mouth daily., Disp: 90 tablet, Rfl: 4 .  fenofibrate (TRICOR) 145 MG tablet, Take 145 mg by mouth daily. , Disp: , Rfl:  .  furosemide (LASIX) 40 MG tablet, Take 40 mg by mouth daily. , Disp: , Rfl:  .  gemfibrozil (LOPID) 600 MG tablet, Take 1 tablet (600 mg total) by mouth daily., Disp: 90 tablet, Rfl: 3 .  insulin regular human CONCENTRATED (HUMULIN R)  500 UNIT/ML injection, Continuous rate plus boluses on a sliding scale., Disp: , Rfl:  .  Oxycodone HCl 10 MG TABS, Take 1 tablet (10 mg total) by mouth every 4 (four) hours as needed., Disp: 30 tablet, Rfl: 0 .  potassium chloride (KLOR-CON M10) 10 MEQ tablet, Take 10 mEq by mouth daily. , Disp: , Rfl:  .  rosuvastatin (CRESTOR) 40 MG tablet, Take 1 tablet (40 mg total) by mouth daily., Disp: 90 tablet, Rfl: 3 .  sacubitril-valsartan (ENTRESTO) 49-51 MG, Take 1 tablet by mouth 2 (two) times daily., Disp: 60 tablet, Rfl: 6 .  sertraline (ZOLOFT) 100 MG tablet, Take 2 tablets (200 mg total) by mouth daily., Disp: 180 tablet, Rfl: 1 .  spironolactone (ALDACTONE) 25 MG tablet, Take 1 tablet (25 mg total) by mouth daily., Disp: 90 tablet, Rfl: 3  Past Medical History: Past Medical History:  Diagnosis Date  . AICD (automatic cardioverter/defibrillator) present   . Anxiety    takes Xanax as needed  . Arthritis    back,knees,right shoulder  . Back pain   . Cardiomyopathy ([place])   . CHF (congestive heart failure) (HCC)    takes Lasix and Aldactone daily  . Coronary artery disease    takes Plavix daily  . Depression    takes Zoloft daily  . Diabetes mellitus without complication ([place])  Humulin R and Farxiga daily.Fasting blood sugar runs 140  . GERD (gastroesophageal reflux disease)   . Headache   . History of bronchitis   . History of colon polyps    benign  . History of hiatal hernia   . Hyperlipidemia    takes Fenofibrate,Crestor, and Zetia daily  . Hypertension    takes Entresto and Coreg daily  . MI (myocardial infarction) 2001  . Obesity   . Peripheral neuropathy ([place])   . Pneumonia    history of-last time about 14 yrs ago  . PONV (postoperative nausea and vomiting)    after knee surgery 25 yrs ago b/p stayed elevated for a while  . Presence of permanent cardiac pacemaker   . Shortness of breath dyspnea    with exertion  . Sleep apnea    uses CPAP nightly  . Ventricular  tachycardia ([place])    s/p RFCA PVCs 2013    Tobacco Use: History  Smoking Status  . Former Smoker  . Packs/day: 0.50  . Years: 25.00  . Types: Cigarettes  Smokeless Tobacco  . Never Used    Labs: Recent Review Flowsheet Data    Labs for ITP Cardiac and Pulmonary Rehab Latest Ref Rng & Units 05/24/2016 05/24/2016 05/24/2016 05/24/2016 05/25/2016   Cholestrol 0 - 200 mg/dL - - - - -   LDLDIRECT mg/dL - - - - -   HDL >39.00 mg/dL - - - - -   Trlycerides 0.0 - 149.0 mg/dL - - - - -   Hemoglobin A1c 4.8 - 5.6 % - - - - -   PHART 7.350 - 7.450 7.354 7.344(L) 7.284(L) - -   PCO2ART 32.0 - 48.0 mmHg 41.0 42.0 48.6(H) - -   HCO3 20.0 - 28.0 mmol/L 22.8 22.7 22.9 - -   TCO2 0 - 100 mmol/L _0 ACIDBASEDEF 0.0 - 2.0 mmol/L 3.0(H) 3.0(H) 4.0(H) - -   O2SAT % 98.0 97.0 92.0 - -       Exercise Target Goals:    Exercise Program Goal: Individual exercise prescription set with THRR, safety & activity barriers. Participant demonstrates ability to understand and report RPE using BORG scale, to self-measure pulse accurately, and to acknowledge the importance of the exercise prescription.  Exercise Prescription Goal: Starting with aerobic activity 30 plus minutes a day, 3 days per week for initial exercise prescription. Provide home exercise prescription and guidelines that participant acknowledges understanding prior to discharge.  Activity Barriers & Risk Stratification:     Activity Barriers & Cardiac Risk Stratification - 07/16/16 1427      Activity Barriers & Cardiac Risk Stratification   Activity Barriers Back Problems;Shortness of Breath;Incisional Pain;Joint Problems;Arthritis;Deconditioning;Decreased Ventricular Function;Balance Concerns;History of Falls  Decreased EF around 30%, Arthritis L knee, no cartilage R knee, L5 disc gone   Cardiac Risk Stratification High      6 Minute Walk:     6 Minute Walk    Row Name 07/16/16 1545         6 Minute Walk   Phase Initial      Distance 1210 feet     Walk Time 6 minutes     # of Rest Breaks 0     MPH 2.29     METS 3.25     RPE 13     Perceived Dyspnea  3     VO2 Peak 11.38     Symptoms Yes (comment)     Comments SOB, sternum  pain 4/10     Resting HR 86 bpm     Resting BP 126/66     Max Ex. HR 126 bpm     Max Ex. BP 136/70     2 Minute Post BP 124/62        Initial Exercise Prescription:     Initial Exercise Prescription - 07/16/16 1500      Date of Initial Exercise RX and Referring Provider   Date 07/16/16   Referring Provider Ida Rogue [facility]     Treadmill   MPH 2   Grade 1   Minutes 15   METs 2.81     Recumbant Elliptical   Level 2   RPM 50   Minutes 15   METs 2     T5 Nustep   Level 2   Watts --  80-100 spm   Minutes 15   METs 2     Prescription Details   Frequency (times per week) 2   Duration Progress to 45 minutes of aerobic exercise without signs/symptoms of physical distress     Intensity   THRR 40-80% of Max Heartrate 118-149   Ratings of Perceived Exertion 11-15   Perceived Dyspnea 0-4     Progression   Progression Continue to progress workloads to maintain intensity without signs/symptoms of physical distress.     Resistance Training   Training Prescription Yes   Weight 3 lbs   Reps 10-12      Perform Capillary Blood Glucose checks as needed.  Exercise Prescription Changes:     Exercise Prescription Changes    Row Name 07/16/16 1400 08/01/16 1400 08/15/16 1500         Exercise Review   Progression -  walk test results -  First partial day of exercise Yes       Response to Exercise   Blood Pressure (Admit) 126/66 114/60 104/60     Blood Pressure (Exercise) 136/70 136/74 146/70     Blood Pressure (Exit) 124/62 132/80 126/64     Heart Rate (Admit) 86 bpm 83 bpm 88 bpm     Heart Rate (Exercise) 126 bpm 105 bpm 125 bpm     Heart Rate (Exit) 85 bpm 90 bpm 92 bpm     Oxygen Saturation (Admit) 97 %  -  -     Oxygen Saturation (Exercise) 96 %   -  -     Rating of Perceived Exertion (Exercise) _0 Symptoms SOB, sternum pain 3/10 SOB, neck pain SOB, neck pain     Duration  -  - Progress to 45 minutes of aerobic exercise without signs/symptoms of physical distress     Intensity  -  - THRR unchanged       Progression   Progression  -  - Continue to progress workloads to maintain intensity without signs/symptoms of physical distress.     Average METs  -  - 2.31       Resistance Training   Training Prescription  -  - Yes     Weight  -  - 3 lbs     Reps  -  - 10-12       Interval Training   Interval Training  -  - No       Treadmill   MPH  -  - 2     Grade  -  - 1     Minutes  -  - 15  METs  -  - 2.81       Recumbant Elliptical   Level  - 2 2     RPM  - 50 50     Minutes  - 15 15     METs  - 1.8 1.8        Exercise Comments:     Exercise Comments    Row Name 07/16/16 1547 08/01/16 1355 08/15/16 1543       Exercise Comments Brian Williams wants to get back to playing golf again. Brian Williams's first day of rehab was on 11/9 but he left early and was escorted to the ED for neck/chest pain.  He had an outpatient stress test that did not reveal any ischemia.  We will wait for clearance for him to return. Brian Williams did well on his second day of exercise.  He still had all of the same symptoms, but they have not changed and occur at rest as well.  We will continue to monitor him.        Discharge Exercise Prescription (Final Exercise Prescription Changes):     Exercise Prescription Changes - 08/15/16 1500      Exercise Review   Progression Yes     Response to Exercise   Blood Pressure (Admit) 104/60   Blood Pressure (Exercise) 146/70   Blood Pressure (Exit) 126/64   Heart Rate (Admit) 88 bpm   Heart Rate (Exercise) 125 bpm   Heart Rate (Exit) 92 bpm   Rating of Perceived Exertion (Exercise) 13   Symptoms SOB, neck pain   Duration Progress to 45 minutes of aerobic exercise without signs/symptoms of physical distress    Intensity THRR unchanged     Progression   Progression Continue to progress workloads to maintain intensity without signs/symptoms of physical distress.   Average METs 2.31     Resistance Training   Training Prescription Yes   Weight 3 lbs   Reps 10-12     Interval Training   Interval Training No     Treadmill   MPH 2   Grade 1   Minutes 15   METs 2.81     Recumbant Elliptical   Level 2   RPM 50   Minutes 15   METs 1.8      Nutrition:  Target Goals: Understanding of nutrition guidelines, daily intake of sodium <1553m, cholesterol <2023m calories 30% from fat and 7% or less from saturated fats, daily to have 5 or more servings of fruits and vegetables.  Biometrics:     Pre Biometrics - 07/16/16 1550      Pre Biometrics   Height 5' 11.9" (1.826 m)   Weight 277 lb 11.2 oz (126 kg)   Waist Circumference 50 inches   Hip Circumference 45 inches   Waist to Hip Ratio 1.11 %   BMI (Calculated) 37.8   Single Leg Stand 1.02 seconds       Nutrition Therapy Plan and Nutrition Goals:     Nutrition Therapy & Goals - 07/16/16 1414      Intervention Plan   Intervention Prescribe, educate and counsel regarding individualized specific dietary modifications aiming towards targeted core components such as weight, hypertension, lipid management, diabetes, heart failure and other comorbidities.   Expected Outcomes Short Term Goal: Understand basic principles of dietary content, such as calories, fat, sodium, cholesterol and nutrients.;Short Term Goal: A plan has been developed with personal nutrition goals set during dietitian appointment.;Long Term Goal: Adherence to prescribed nutrition plan.  Nutrition Discharge: Rate Your Plate Scores:     Nutrition Assessments - 07/16/16 1414      Rate Your Plate Scores   Pre Score 66   Pre Score % 74 %      Nutrition Goals Re-Evaluation:     Nutrition Goals Re-Evaluation    Row Name 07/26/16 0928              Personal Goal #1 Re-Evaluation   Comments Brian Williams said he didn't feel well enought to see Cardiac REhab Registered Dietician this past Friday. He said he has been short of breath. Pulse ox today exercising on T5 Nustep was 95% on room air.           Psychosocial: Target Goals: Acknowledge presence or absence of depression, maximize coping skills, provide positive support system. Participant is able to verbalize types and ability to use techniques and skills needed for reducing stress and depression.  Initial Review & Psychosocial Screening:     Initial Psych Review & Screening - 07/16/16 1421      Initial Review   Current issues with Current Depression;Current Psychotropic Meds;Current Sleep Concerns;Current Stress Concerns   Source of Stress Concerns Financial;Chronic Illness     Family Dynamics   Good Support System? No   Strains --  States no emotional support, family will physically help if needed.  Family members continue to smoke around [place] though he has quit and is struggling to remain quit.   Concerns No support system     Barriers   Psychosocial barriers to participate in program There are no identifiable barriers or psychosocial needs.;The patient should benefit from training in stress management and relaxation.     Screening Interventions   Interventions Encouraged to exercise      Quality of Life Scores:     Quality of Life - 07/16/16 1424      Quality of Life Scores   Health/Function Pre 20.27 %   Socioeconomic Pre 20.67 %   Psych/Spiritual Pre 21 %   Family Pre 21 %   GLOBAL Pre 20.61 %      PHQ-9: Recent Review Flowsheet Data    Depression screen [facility] 2/9 07/16/2016 06/19/2016   Decreased Interest 2 0   Down, Depressed, Hopeless 2 1   PHQ - 2 Score 4 1   Altered sleeping 3 -   Tired, decreased energy 3 -   Change in appetite 3 -   Feeling bad or failure about yourself  3 -   Trouble concentrating 1 -   Moving slowly or fidgety/restless 0 -    Suicidal thoughts 1 -   PHQ-9 Score 18 -   Difficult doing work/chores Somewhat difficult -      Psychosocial Evaluation and Intervention:     Psychosocial Evaluation - 08/14/16 0948      Psychosocial Evaluation & Interventions   Interventions Encouraged to exercise with the program and follow exercise prescription;Stress management education;Relaxation education   Comments Counselor met with [name] today for initial psychosocial evaluation.  He is a 55 year old who had CABGx4 surgery early September.  He has a strong support system with a spouse of 30 years and (2) adult children who live in the home.  He has other health issues with diabetes and sleep apnea.  He uses a CPAP for sleep and states he gets maybe 5-6 hours/night; but typically has trouble falling asleep.  He admits to a history of depression and is on medications for that currently;  although he agrees it is not treating his current symptoms as his PHQ-9 score was an "18" indication moderately severe depression.  Brian Williams states he lacks motivation and is mood is generally flat or depressed.  He has stress as "Mr. Mom" staying at home and his health.  Also having adult children in the home is stressful and he mentioned finances are a stressor at this time.  [name] has goals to be more active and get back on the golf course in the near future.  Counselor recommended that he speak with his doctor about increasing his medications for his mood and to see if exercise helps with this as well.  He may need to see a counselor as well and that was mentioned to him; as well as cutting back on caffeine to help with getting to sleep better at night.     Continued Psychosocial Services Needed Yes  [name] will benefit from speaking with his Dr. about increasing his meds for depressive symptoms.  He also will benefit from stress management classes in this program and possibly seeing a counselor for his depression.       Psychosocial Re-Evaluation:      Psychosocial Re-Evaluation    Row Name 07/26/16 [phone]             Psychosocial Re-Evaluation   Comments Brian Williams seemed frustrated since his weight is up and he felt short of breath plus had 6/10 neck and chest pain that did not go away with rest.           Vocational Rehabilitation: Provide vocational rehab assistance to qualifying candidates.   Vocational Rehab Evaluation & Intervention:     Vocational Rehab - 07/16/16 1430      Initial Vocational Rehab Evaluation & Intervention   Assessment shows need for Vocational Rehabilitation No      Education: Education Goals: Education classes will be provided on a weekly basis, covering required topics. Participant will state understanding/return demonstration of topics presented.  Learning Barriers/Preferences:     Learning Barriers/Preferences - 07/16/16 1429      Learning Barriers/Preferences   Learning Barriers None   Learning Preferences None      Education Topics: General Nutrition Guidelines/Fats and Fiber: -Group instruction provided by verbal, written material, models and posters to present the general guidelines for heart healthy nutrition. Gives an explanation and review of dietary fats and fiber.   Controlling Sodium/Reading Food Labels: -Group verbal and written material supporting the discussion of sodium use in heart healthy nutrition. Review and explanation with models, verbal and written materials for utilization of the food label.   Exercise Physiology & Risk Factors: - Group verbal and written instruction with models to review the exercise physiology of the cardiovascular system and associated critical values. Details cardiovascular disease risk factors and the goals associated with each risk factor. Flowsheet Row Cardiac Rehab from 08/14/2016 in [facility] Cardiac and Pulmonary Rehab  Date  07/26/16  Educator  [facility]  Instruction Review Code  2- meets goals/outcomes      Aerobic Exercise & Resistance  Training: - Gives group verbal and written discussion on the health impact of inactivity. On the components of aerobic and resistive training programs and the benefits of this training and how to safely progress through these programs. Flowsheet Row Cardiac Rehab from 08/14/2016 in [facility] Cardiac and Pulmonary Rehab  Date  08/14/16  Educator  [facility]  Instruction Review Code  2- meets goals/outcomes      Flexibility, Balance,  General Exercise Guidelines: - Provides group verbal and written instruction on the benefits of flexibility and balance training programs. Provides general exercise guidelines with specific guidelines to those with heart or lung disease. Demonstration and skill practice provided.   Stress Management: - Provides group verbal and written instruction about the health risks of elevated stress, cause of high stress, and healthy ways to reduce stress.   Depression: - Provides group verbal and written instruction on the correlation between heart/lung disease and depressed mood, treatment options, and the stigmas associated with seeking treatment.   Anatomy & Physiology of the Heart: - Group verbal and written instruction and models provide basic cardiac anatomy and physiology, with the coronary electrical and arterial systems. Review of: AMI, Angina, Valve disease, Heart Failure, Cardiac Arrhythmia, Pacemakers, and the ICD.   Cardiac Procedures: - Group verbal and written instruction and models to describe the testing methods done to diagnose heart disease. Reviews the outcomes of the test results. Describes the treatment choices: Medical Management, Angioplasty, or Coronary Bypass Surgery.   Cardiac Medications: - Group verbal and written instruction to review commonly prescribed medications for heart disease. Reviews the medication, class of the drug, and side effects. Includes the steps to properly store meds and maintain the prescription regimen.   Go Sex-Intimacy & Heart  Disease, Get SMART - Goal Setting: - Group verbal and written instruction through game format to discuss heart disease and the return to sexual intimacy. Provides group verbal and written material to discuss and apply goal setting through the application of the S.M.A.R.T. Method.   Other Matters of the Heart: - Provides group verbal, written materials and models to describe Heart Failure, Angina, Valve Disease, and Diabetes in the realm of heart disease. Includes description of the disease process and treatment options available to the cardiac patient.   Exercise & Equipment Safety: - Individual verbal instruction and demonstration of equipment use and safety with use of the equipment. Flowsheet Row Cardiac Rehab from 08/14/2016 in [facility] Cardiac and Pulmonary Rehab  Date  07/16/16  Educator  SB  Instruction Review Code  2- meets goals/outcomes      Infection Prevention: - Provides verbal and written material to individual with discussion of infection control including proper hand washing and proper equipment cleaning during exercise session. Flowsheet Row Cardiac Rehab from 08/14/2016 in [facility] Cardiac and Pulmonary Rehab  Date  07/16/16  Educator  SB  Instruction Review Code  2- meets goals/outcomes      Falls Prevention: - Provides verbal and written material to individual with discussion of falls prevention and safety. Flowsheet Row Cardiac Rehab from 08/14/2016 in [facility] Cardiac and Pulmonary Rehab  Date  07/16/16  Educator  SB  Instruction Review Code  2- meets goals/outcomes      Diabetes: - Individual verbal and written instruction to review signs/symptoms of diabetes, desired ranges of glucose level fasting, after meals and with exercise. Advice that pre and post exercise glucose checks will be done for 3 sessions at entry of program. Flowsheet Row Cardiac Rehab from 08/14/2016 in [facility] Cardiac and Pulmonary Rehab  Date  07/16/16  Educator  SB  Instruction Review Code  2-  meets goals/outcomes       Knowledge Questionnaire Score:     Knowledge Questionnaire Score - 07/16/16 1429      Knowledge Questionnaire Score   Pre Score 24/28      Core Components/Risk Factors/Patient Goals at Admission:     Personal Goals and Risk Factors at Admission -  07/16/16 1415      Core Components/Risk Factors/Patient Goals on Admission    Weight Management Obesity;Yes   Intervention Weight Management/Obesity: Establish reasonable short term and long term weight goals.;Weight Management: Provide education and appropriate resources to help participant work on and attain dietary goals.;Weight Management: Develop a combined nutrition and exercise program designed to reach desired caloric intake, while maintaining appropriate intake of nutrient and fiber, sodium and fats, and appropriate energy expenditure required for the weight goal.;Obesity: Provide education and appropriate resources to help participant work on and attain dietary goals.   Admit Weight 277 lb (125.6 kg)   Goal Weight: Short Term 274 lb (124.3 kg)   Goal Weight: Long Term 199 lb (90.3 kg)   Expected Outcomes Short Term: Continue to assess and modify interventions until short term weight is achieved;Long Term: Adherence to nutrition and physical activity/exercise program aimed toward attainment of established weight goal;Weight Loss: Understanding of general recommendations for a balanced deficit meal plan, which promotes 1-2 lb weight loss per week and includes a negative energy balance of [phone] kcal/d   Sedentary Yes   Intervention Provide advice, education, support and counseling about physical activity/exercise needs.;Develop an individualized exercise prescription for aerobic and resistive training based on initial evaluation findings, risk stratification, comorbidities and participant's personal goals.   Expected Outcomes Achievement of increased cardiorespiratory fitness and enhanced flexibility,  muscular endurance and strength shown through measurements of functional capacity and personal statement of participant.   Increase Strength and Stamina Yes   Intervention Provide advice, education, support and counseling about physical activity/exercise needs.;Develop an individualized exercise prescription for aerobic and resistive training based on initial evaluation findings, risk stratification, comorbidities and participant's personal goals.   Expected Outcomes Achievement of increased cardiorespiratory fitness and enhanced flexibility, muscular endurance and strength shown through measurements of functional capacity and personal statement of participant.   Tobacco Cessation Yes   Number of packs per day quit 05/24/2016   Lives with 3 smokers   Intervention Assist the participant in steps to quit. Provide individualized education and counseling about committing to Tobacco Cessation, relapse prevention, and pharmacological support that can be provided by physician.   Expected Outcomes Long Term: Complete abstinence from all tobacco products for at least 12 months from quit date.   Diabetes Yes   Intervention Provide education about signs/symptoms and action to take for hypo/hyperglycemia.;Provide education about proper nutrition, including hydration, and aerobic/resistive exercise prescription along with prescribed medications to achieve blood glucose in normal ranges: Fasting glucose 65-99 mg/dL   Expected Outcomes Short Term: Participant verbalizes understanding of the signs/symptoms and immediate care of hyper/hypoglycemia, proper foot care and importance of medication, aerobic/resistive exercise and nutrition plan for blood glucose control.;Long Term: Attainment of HbA1C < 7%.   Heart Failure Yes   Intervention Provide a combined exercise and nutrition program that is supplemented with education, support and counseling about heart failure. Directed toward relieving symptoms such as shortness of  breath, decreased exercise tolerance, and extremity edema.   Expected Outcomes Improve functional capacity of life;Short term: Attendance in program 2-3 days a week with increased exercise capacity. Reported lower sodium intake. Reported increased fruit and vegetable intake. Reports medication compliance.;Short term: Daily weights obtained and reported for increase. Utilizing diuretic protocols set by physician.;Long term: Adoption of self-care skills and reduction of barriers for early signs and symptoms recognition and intervention leading to self-care maintenance.   Hypertension Yes   Intervention Provide education on lifestyle modifcations including regular physical activity/exercise, weight management,  moderate sodium restriction and increased consumption of fresh fruit, vegetables, and low fat dairy, alcohol moderation, and smoking cessation.;Monitor prescription use compliance.   Expected Outcomes Short Term: Continued assessment and intervention until BP is < 140/65m HG in hypertensive participants. < 130/877mHG in hypertensive participants with diabetes, heart failure or chronic kidney disease.;Long Term: Maintenance of blood pressure at goal levels.   Lipids Yes   Intervention Provide education and support for participant on nutrition & aerobic/resistive exercise along with prescribed medications to achieve LDL <7015mHDL >5m44m Expected Outcomes Short Term: Participant states understanding of desired cholesterol values and is compliant with medications prescribed. Participant is following exercise prescription and nutrition guidelines.;Long Term: Cholesterol controlled with medications as prescribed, with individualized exercise RX and with personalized nutrition plan. Value goals: LDL < 70mg55mL > 40 mg.   Stress Yes  Lives with family that continues to smoke around him . He just Quit tobacco products.  Finaincial stress, and family stress:has 2 sons over 21 ye61s old still living at home.    Intervention Offer individual and/or small group education and counseling on adjustment to heart disease, stress management and health-related lifestyle change. Teach and support self-help strategies.;Refer participants experiencing significant psychosocial distress to appropriate mental health specialists for further evaluation and treatment. When possible, include family members and significant others in education/counseling sessions.   Expected Outcomes Short Term: Participant demonstrates changes in health-related behavior, relaxation and other stress management skills, ability to obtain effective social support, and compliance with psychotropic medications if prescribed.;Long Term: Emotional wellbeing is indicated by absence of clinically significant psychosocial distress or social isolation.      Core Components/Risk Factors/Patient Goals Review:      Goals and Risk Factor Review    Row Name 07/27/16 0725             Core Components/Risk Factors/Patient Goals Review   Personal Goals Review Sedentary       Review [facility] stress test outpatient today       Expected Outcomes Will assess after results of Stress test and hopefully get clearance for KevinVrajeturn to Cardiac Rehab.           Core Components/Risk Factors/Patient Goals at Discharge (Final Review):      Goals and Risk Factor Review - 07/27/16 0725      Core Components/Risk Factors/Patient Goals Review   Personal Goals Review Sedentary   Review [facility] test outpatient today   Expected Outcomes Will assess after results of Stress test and hopefully get clearance for KevinMatiseturn to Cardiac Rehab.       ITP Comments:     ITP Comments    Row Name 07/16/16 1406 07/25/16 0707 07/26/16 0927 07/26/16 0933 07/26/16 0947   ITP Comments Med review completed today with initial ITP created.  [facility] 30 day review completed for Medical Director physician review and signature. Continue ITP unless changes made by physician.New to program Discussed with KevinStacien to notify his MD when his weight is up like today up to 285.6 lbs from 277lbs on Jul 16, 2016 neck and chest pain with exercise today and he said chronic. I have asked KevinOrdeano to Dr. GollaDonivan Scullce today.  [facility] and chest /neck pain did not subside  with rest plus he felt SOB.   [place] Name 07/27/16 0724 08/20/16 1625         ITP Comments For Outpatient Stress Test today. Dr Rockey Situ ordered. Will follow up when he can return to [place] called to let us know that he will need to drop from the program.  He was notified on Friday, that he no longer will be covered under his insurance.  Thus, he cannot afford the program.  He requested information about what to do at home.  We talked about walking at home and I told him that I would send him our home exercise packet.         Comments: Discharge ITP

## 2016-08-20 NOTE — Progress Notes (Signed)
Discharge Summary  Patient Details  Name: Brian Williams MRN: [id number] Date of Birth: 1961/06/09 Referring Provider:   Flowsheet Row Cardiac Rehab from 07/16/2016 in [facility]  Referring Provider  Ida Rogue [facility]       Number of Visits: 4  Reason for Discharge:  Early Exit:  Insurance  Smoking History:  History  Smoking Status  . Former Smoker  . Packs/day: 0.50  . Years: 25.00  . Types: Cigarettes  Smokeless Tobacco  . Never Used    Diagnosis:  S/P CABG x 4  ADL UCSD:   Initial Exercise Prescription:     Initial Exercise Prescription - 07/16/16 1500      Date of Initial Exercise RX and Referring Provider   Date 07/16/16   Referring Provider Ida Rogue [facility]     Treadmill   MPH 2   Grade 1   Minutes 15   METs 2.81     Recumbant Elliptical   Level 2   RPM 50   Minutes 15   METs 2     T5 Nustep   Level 2   Watts --  80-100 spm   Minutes 15   METs 2     Prescription Details   Frequency (times per week) 2   Duration Progress to 45 minutes of aerobic exercise without signs/symptoms of physical distress     Intensity   THRR 40-80% of Max Heartrate 118-149   Ratings of Perceived Exertion 11-15   Perceived Dyspnea 0-4     Progression   Progression Continue to progress workloads to maintain intensity without signs/symptoms of physical distress.     Resistance Training   Training Prescription Yes   Weight 3 lbs   Reps 10-12      Discharge Exercise Prescription (Final Exercise Prescription Changes):     Exercise Prescription Changes - 08/15/16 1500      Exercise Review   Progression Yes     Response to Exercise   Blood Pressure (Admit) 104/60   Blood Pressure (Exercise) 146/70   Blood Pressure (Exit) 126/64   Heart Rate (Admit) 88 bpm   Heart Rate (Exercise) 125 bpm   Heart Rate (Exit) 92 bpm   Rating of Perceived Exertion (Exercise) 13   Symptoms SOB, neck pain   Duration Progress to 45 minutes of  aerobic exercise without signs/symptoms of physical distress   Intensity THRR unchanged     Progression   Progression Continue to progress workloads to maintain intensity without signs/symptoms of physical distress.   Average METs 2.31     Resistance Training   Training Prescription Yes   Weight 3 lbs   Reps 10-12     Interval Training   Interval Training No     Treadmill   MPH 2   Grade 1   Minutes 15   METs 2.81     Recumbant Elliptical   Level 2   RPM 50   Minutes 15   METs 1.8      Functional Capacity:     6 Minute Walk    Row Name 07/16/16 1545         6 Minute Walk   Phase Initial     Distance 1210 feet     Walk Time 6 minutes     # of Rest Breaks 0     MPH 2.29     METS 3.25     RPE 13     Perceived Dyspnea  3  VO2 Peak 11.38     Symptoms Yes (comment)     Comments SOB, sternum pain 4/10     Resting HR 86 bpm     Resting BP 126/66     Max Ex. HR 126 bpm     Max Ex. BP 136/70     2 Minute Post BP 124/62        Psychological, QOL, Others - Outcomes: PHQ 2/9: Depression screen [facility] 2/9 07/16/2016 06/19/2016  Decreased Interest 2 0  Down, Depressed, Hopeless 2 1  PHQ - 2 Score 4 1  Altered sleeping 3 -  Tired, decreased energy 3 -  Change in appetite 3 -  Feeling bad or failure about yourself  3 -  Trouble concentrating 1 -  Moving slowly or fidgety/restless 0 -  Suicidal thoughts 1 -  PHQ-9 Score 18 -  Difficult doing work/chores Somewhat difficult -    Quality of Life:     Quality of Life - 07/16/16 1424      Quality of Life Scores   Health/Function Pre 20.27 %   Socioeconomic Pre 20.67 %   Psych/Spiritual Pre 21 %   Family Pre 21 %   GLOBAL Pre 20.61 %      Personal Goals: Goals established at orientation with interventions provided to work toward goal.     Personal Goals and Risk Factors at Admission - 07/16/16 1415      Core Components/Risk Factors/Patient Goals on Admission    Weight Management Obesity;Yes    Intervention Weight Management/Obesity: Establish reasonable short term and long term weight goals.;Weight Management: Provide education and appropriate resources to help participant work on and attain dietary goals.;Weight Management: Develop a combined nutrition and exercise program designed to reach desired caloric intake, while maintaining appropriate intake of nutrient and fiber, sodium and fats, and appropriate energy expenditure required for the weight goal.;Obesity: Provide education and appropriate resources to help participant work on and attain dietary goals.   Admit Weight 277 lb (125.6 kg)   Goal Weight: Short Term 274 lb (124.3 kg)   Goal Weight: Long Term 199 lb (90.3 kg)   Expected Outcomes Short Term: Continue to assess and modify interventions until short term weight is achieved;Long Term: Adherence to nutrition and physical activity/exercise program aimed toward attainment of established weight goal;Weight Loss: Understanding of general recommendations for a balanced deficit meal plan, which promotes 1-2 lb weight loss per week and includes a negative energy balance of [phone] kcal/d   Sedentary Yes   Intervention Provide advice, education, support and counseling about physical activity/exercise needs.;Develop an individualized exercise prescription for aerobic and resistive training based on initial evaluation findings, risk stratification, comorbidities and participant's personal goals.   Expected Outcomes Achievement of increased cardiorespiratory fitness and enhanced flexibility, muscular endurance and strength shown through measurements of functional capacity and personal statement of participant.   Increase Strength and Stamina Yes   Intervention Provide advice, education, support and counseling about physical activity/exercise needs.;Develop an individualized exercise prescription for aerobic and resistive training based on initial evaluation findings, risk stratification,  comorbidities and participant's personal goals.   Expected Outcomes Achievement of increased cardiorespiratory fitness and enhanced flexibility, muscular endurance and strength shown through measurements of functional capacity and personal statement of participant.   Tobacco Cessation Yes   Number of packs per day quit 05/24/2016   Lives with 3 smokers   Intervention Assist the participant in steps to quit. Provide individualized education and counseling about committing to Tobacco Cessation, relapse  prevention, and pharmacological support that can be provided by physician.   Expected Outcomes Long Term: Complete abstinence from all tobacco products for at least 12 months from quit date.   Diabetes Yes   Intervention Provide education about signs/symptoms and action to take for hypo/hyperglycemia.;Provide education about proper nutrition, including hydration, and aerobic/resistive exercise prescription along with prescribed medications to achieve blood glucose in normal ranges: Fasting glucose 65-99 mg/dL   Expected Outcomes Short Term: Participant verbalizes understanding of the signs/symptoms and immediate care of hyper/hypoglycemia, proper foot care and importance of medication, aerobic/resistive exercise and nutrition plan for blood glucose control.;Long Term: Attainment of HbA1C < 7%.   Heart Failure Yes   Intervention Provide a combined exercise and nutrition program that is supplemented with education, support and counseling about heart failure. Directed toward relieving symptoms such as shortness of breath, decreased exercise tolerance, and extremity edema.   Expected Outcomes Improve functional capacity of life;Short term: Attendance in program 2-3 days a week with increased exercise capacity. Reported lower sodium intake. Reported increased fruit and vegetable intake. Reports medication compliance.;Short term: Daily weights obtained and reported for increase. Utilizing diuretic protocols set by  physician.;Long term: Adoption of self-care skills and reduction of barriers for early signs and symptoms recognition and intervention leading to self-care maintenance.   Hypertension Yes   Intervention Provide education on lifestyle modifcations including regular physical activity/exercise, weight management, moderate sodium restriction and increased consumption of fresh fruit, vegetables, and low fat dairy, alcohol moderation, and smoking cessation.;Monitor prescription use compliance.   Expected Outcomes Short Term: Continued assessment and intervention until BP is < 140/55mm HG in hypertensive participants. < 130/17mm HG in hypertensive participants with diabetes, heart failure or chronic kidney disease.;Long Term: Maintenance of blood pressure at goal levels.   Lipids Yes   Intervention Provide education and support for participant on nutrition & aerobic/resistive exercise along with prescribed medications to achieve LDL 70mg , HDL >40mg .   Expected Outcomes Short Term: Participant states understanding of desired cholesterol values and is compliant with medications prescribed. Participant is following exercise prescription and nutrition guidelines.;Long Term: Cholesterol controlled with medications as prescribed, with individualized exercise RX and with personalized nutrition plan. Value goals: LDL < 70mg , HDL > 40 mg.   Stress Yes  Lives with family that continues to smoke around him . He just Quit tobacco products.  Finaincial stress, and family stress:has 2 sons over 80 years old still living at home.   Intervention Offer individual and/or small group education and counseling on adjustment to heart disease, stress management and health-related lifestyle change. Teach and support self-help strategies.;Refer participants experiencing significant psychosocial distress to appropriate mental health specialists for further evaluation and treatment. When possible, include family members and significant  others in education/counseling sessions.   Expected Outcomes Short Term: Participant demonstrates changes in health-related behavior, relaxation and other stress management skills, ability to obtain effective social support, and compliance with psychotropic medications if prescribed.;Long Term: Emotional wellbeing is indicated by absence of clinically significant psychosocial distress or social isolation.       Personal Goals Discharge:     Goals and Risk Factor Review    Row Name 07/27/16 0725             Core Components/Risk Factors/Patient Goals Review   Personal Goals Review Sedentary       Review Taken to [place] yesterday for chest pain for stress test outpatient today       Expected Outcomes Will assess after results  of Stress test and hopefully get clearance for [place] to return to Cardiac Rehab.           Nutrition & Weight - Outcomes:     Pre Biometrics - 07/16/16 1550      Pre Biometrics   Height 5' 11.9" (1.826 m)   Weight 277 lb 11.2 oz (126 kg)   Waist Circumference 50 inches   Hip Circumference 45 inches   Waist to Hip Ratio 1.11 %   BMI (Calculated) 37.8   Single Leg Stand 1.02 seconds       Nutrition:     Nutrition Therapy & Goals - 07/16/16 1414      Intervention Plan   Intervention Prescribe, educate and counsel regarding individualized specific dietary modifications aiming towards targeted core components such as weight, hypertension, lipid management, diabetes, heart failure and other comorbidities.   Expected Outcomes Short Term Goal: Understand basic principles of dietary content, such as calories, fat, sodium, cholesterol and nutrients.;Short Term Goal: A plan has been developed with personal nutrition goals set during dietitian appointment.;Long Term Goal: Adherence to prescribed nutrition plan.      Nutrition Discharge:     Nutrition Assessments - 07/16/16 1414      Rate Your Plate Scores   Pre Score 66   Pre Score % 74 %       Education Questionnaire Score:     Knowledge Questionnaire Score - 07/16/16 1429      Knowledge Questionnaire Score   Pre Score 24/28      Goals reviewed with patient; copy given to patient.

## 2016-08-21 ENCOUNTER — Encounter: Payer: BLUE CROSS/BLUE SHIELD | Attending: Family Medicine

## 2016-08-21 DIAGNOSIS — Z87891 Personal history of nicotine dependence: Secondary | ICD-10-CM | POA: Insufficient documentation

## 2016-08-21 DIAGNOSIS — E785 Hyperlipidemia, unspecified: Secondary | ICD-10-CM | POA: Insufficient documentation

## 2016-08-21 DIAGNOSIS — G4730 Sleep apnea, unspecified: Secondary | ICD-10-CM | POA: Insufficient documentation

## 2016-08-21 DIAGNOSIS — Z79899 Other long term (current) drug therapy: Secondary | ICD-10-CM | POA: Insufficient documentation

## 2016-08-21 DIAGNOSIS — Z7982 Long term (current) use of aspirin: Secondary | ICD-10-CM | POA: Insufficient documentation

## 2016-08-21 DIAGNOSIS — F329 Major depressive disorder, single episode, unspecified: Secondary | ICD-10-CM | POA: Insufficient documentation

## 2016-08-21 DIAGNOSIS — F419 Anxiety disorder, unspecified: Secondary | ICD-10-CM | POA: Insufficient documentation

## 2016-08-21 DIAGNOSIS — E669 Obesity, unspecified: Secondary | ICD-10-CM | POA: Insufficient documentation

## 2016-08-21 DIAGNOSIS — Z7902 Long term (current) use of antithrombotics/antiplatelets: Secondary | ICD-10-CM | POA: Insufficient documentation

## 2016-08-21 DIAGNOSIS — K219 Gastro-esophageal reflux disease without esophagitis: Secondary | ICD-10-CM | POA: Insufficient documentation

## 2016-08-21 DIAGNOSIS — I110 Hypertensive heart disease with heart failure: Secondary | ICD-10-CM | POA: Insufficient documentation

## 2016-08-21 DIAGNOSIS — I509 Heart failure, unspecified: Secondary | ICD-10-CM | POA: Insufficient documentation

## 2016-08-21 DIAGNOSIS — Z951 Presence of aortocoronary bypass graft: Secondary | ICD-10-CM | POA: Insufficient documentation

## 2016-08-21 DIAGNOSIS — Z6838 Body mass index (BMI) 38.0-38.9, adult: Secondary | ICD-10-CM | POA: Insufficient documentation

## 2016-08-21 DIAGNOSIS — Z95810 Presence of automatic (implantable) cardiac defibrillator: Secondary | ICD-10-CM | POA: Insufficient documentation

## 2016-08-21 DIAGNOSIS — Z794 Long term (current) use of insulin: Secondary | ICD-10-CM | POA: Insufficient documentation

## 2016-08-21 DIAGNOSIS — Z713 Dietary counseling and surveillance: Secondary | ICD-10-CM | POA: Insufficient documentation

## 2016-08-21 DIAGNOSIS — I252 Old myocardial infarction: Secondary | ICD-10-CM | POA: Insufficient documentation

## 2016-08-21 DIAGNOSIS — E1142 Type 2 diabetes mellitus with diabetic polyneuropathy: Secondary | ICD-10-CM | POA: Insufficient documentation

## 2016-09-19 ENCOUNTER — Ambulatory Visit: Payer: BLUE CROSS/BLUE SHIELD | Admitting: Cardiovascular Disease

## 2016-09-21 ENCOUNTER — Ambulatory Visit: Payer: BLUE CROSS/BLUE SHIELD | Admitting: Cardiovascular Disease

## 2016-10-10 ENCOUNTER — Telehealth: Payer: Self-pay | Admitting: Cardiovascular Disease

## 2016-10-10 NOTE — Telephone Encounter (Signed)
Should be stable to be treated in the office for his dental procedure

## 2016-10-10 NOTE — Telephone Encounter (Signed)
Received letter from pt's dentist, Dr. Clelia Schaumann, DDS.   "The following is to know the actual health status of our mutual patient Brian Williams.  He expressed having health care issues with his cardiovascular system and diabetes.  Recently treated for bypass surgery and diabetes.  Can this patient be treated in our office for extraction of molar under local anesthesia, or he will need a hospital environment for it.  Your recommendation will be appreciated."  Please route clearance to [phone].

## 2016-10-11 NOTE — Telephone Encounter (Signed)
Patient calling to check status of clearance request. Please call patient .

## 2016-10-11 NOTE — Telephone Encounter (Signed)
Left voicemail message with patient that Dr. Rockey Situ has reviewed and that I will forward this over to Dr. Jodi Geralds office with instructions to call back if any further questions.

## 2016-11-09 ENCOUNTER — Telehealth: Payer: Self-pay | Admitting: Cardiology

## 2016-11-09 ENCOUNTER — Encounter: Payer: Self-pay | Admitting: Cardiovascular Disease

## 2016-11-09 ENCOUNTER — Encounter: Payer: Self-pay | Admitting: Cardiology

## 2016-11-09 NOTE — Telephone Encounter (Signed)
Pt called about my chart message he received about delinquent ICD follow up. Pt stated that he does not have health insurance and is unable to pay for remote checks or office visits. Informed pt that without sending remote transmissions or coming into the office every 3 months we are unaware of any episodes he may have, lead impedances, and therapies received from his device. Informed pt that I would make MD aware and once MD gave recommendations I would call him back. Pt verbalized understanding.

## 2016-11-12 ENCOUNTER — Encounter: Payer: Self-pay | Admitting: Cardiovascular Disease

## 2016-11-12 ENCOUNTER — Encounter: Payer: Self-pay | Admitting: Internal Medicine

## 2016-11-12 ENCOUNTER — Telehealth: Payer: Self-pay | Admitting: Pharmacist

## 2016-11-12 NOTE — Telephone Encounter (Signed)
11/12/16  Received notification from Dr Lacinda Axon that Brian Williams was having trouble with copays and possibly affording his medications.  Called patient and left HIPAA compliant voicemail asking him to return my call.  Patients wife has recently lost job and patient is unable to afford medications and copays to office visits.     Plan: -Schedule followup in pharmacy clinic on Mondays.  Patient will need to bring any financial documentation of income or lack of income that he has including W2s, Tax Documents, Social Security documentation to assist in applying for manufacturer patient assistance if he would like assistance -Patient may also be candidate for discount drug lists at certain pharmacies depending on which medications he needs assistance with.   Bennye Alm, PharmD, [place] PGY2 Pharmacy Resident [phone]

## 2016-11-12 NOTE — Telephone Encounter (Signed)
11/12/16  Patient returned call.  He states he has a month or so left on all of his medications.  He states he has been using GoodRx coupon cards which has helped some with the prices.  Patient is currently receiving social security disability and his wife is currently receiving unemployment.  He has started the process for social security disability over 2 years ago but has not been approved yet.    Plan: -Followup in pharmacy clinic next week to submit applications for Farxiga, Humulin U-500, and Entresto manufacturer patient assistance. -Instructed patient to bring income documentation to be submitted with patient assistance applications  Bennye Alm, PharmD, [place] PGY2 Pharmacy Resident [phone]

## 2016-11-15 NOTE — Telephone Encounter (Signed)
Spoke w/ pt and gave him MD recommendations. Pt agreed to yearly OV with MD until he gets insurance. Pt hopes to have insurance by the end of 2018.

## 2016-11-15 NOTE — Telephone Encounter (Signed)
Brian Williams,  Per Dr. Caryl Comes- if he can't afford remotes, will need to make sure he keeps his yearly visits.  Would he consider transmitting every 6 months, if not, still will need yearly follow up with Dr. Caryl Comes for device management.   Thanks!

## 2016-11-19 ENCOUNTER — Encounter: Payer: Self-pay | Admitting: Pharmacist

## 2016-11-19 ENCOUNTER — Ambulatory Visit (INDEPENDENT_AMBULATORY_CARE_PROVIDER_SITE_OTHER): Payer: Self-pay | Admitting: Pharmacist

## 2016-11-19 VITALS — Wt 292.0 lb

## 2016-11-19 DIAGNOSIS — E118 Type 2 diabetes mellitus with unspecified complications: Secondary | ICD-10-CM

## 2016-11-19 DIAGNOSIS — E1165 Type 2 diabetes mellitus with hyperglycemia: Secondary | ICD-10-CM

## 2016-11-19 DIAGNOSIS — IMO0002 Reserved for concepts with insufficient information to code with codable children: Secondary | ICD-10-CM

## 2016-11-19 DIAGNOSIS — Z794 Long term (current) use of insulin: Secondary | ICD-10-CM

## 2016-11-19 NOTE — Patient Instructions (Signed)
Thank you for coming in today.     Please send unemployment income verification fax with Attention: Thersa Salt, MD [phone]   Bennye Alm, PharmD, [place] PGY2 Pharmacy Resident [phone]

## 2016-11-19 NOTE — Progress Notes (Signed)
Care was provided under my supervision. I agree with the management as indicated in the note.  Cristian Grieves DO  

## 2016-11-19 NOTE — Progress Notes (Signed)
    S:    Chief Complaint  Patient presents with  . Medication Assistance    Diabetes, Heart Failure    Patient arrives in good spirits ambulating without assistance accompanied by his wife.  Presents for medication assistance at the request of Dr Lacinda Axon. Patient was referred on 11/12/16 (telephone note).  Patient was last seen by Primary Care Provider on 05/16/16.    Reports changing insulin sets every 3 days but also reports he is trying to make these last as long as possible. He reports that he will self adjust to Humulin U-500 injections if he has to.   Currently on duplicate Fenofibrate and Gemfibrozil and denies taking fish oil in the past.  O:  Physical Exam  Vitals reviewed.   Review of Systems  All other systems reviewed and are negative.   A/P: Medication Assistance: Patient currently receiving disability insurance and applying for social security disability that was previously on [employer] but wife lost her job in December. Patient and wife are currently uninsured and patient cannot afford his Entresto, Humulin U-500, insulin pump supplies, or [place]. Wife is currently applying for new job and current income is currently below threshold for manufacturer patient assistance programs. -Pharmacist will complete applications for [employer] (Farxiga), Novartis ([place]), and Production manager (Humulin U-500) manufacturer patient assistance programs. -Will plan to obtain letter from Dr Lacinda Axon attesting to patients wife losing job and patient being uninsured with limited household income. -Instructed patient/wife to fax unemployed benefit statement to Dr Geralynn Ochs office and to call Pharmacist with the exact number of insulin pump infusion sets he has remaining as there are limited options for patient assistance with these and patient may need to be transitioned to Humulin U-500 injections via communication with his endocrinologist.  -Instructed patient/wife they would likely have to pay out of  pocket for other medications that are generic as assistance options are limited for these medications.  -Will followup via telephone in 1-2 weeks  Written patient instructions provided.  Total time in face to face counseling 50 minutes.

## 2016-12-11 ENCOUNTER — Telehealth: Payer: Self-pay | Admitting: Cardiovascular Disease

## 2016-12-11 ENCOUNTER — Other Ambulatory Visit: Payer: Self-pay | Admitting: Cardiovascular Disease

## 2016-12-11 NOTE — Telephone Encounter (Signed)
Pt called to let us know he cannot schedule a f/u with DR. Gollan at this time. States his wife lost her job and he currently does not have insurance.

## 2016-12-11 NOTE — Telephone Encounter (Signed)
Pt needs a f/u appt with Gollan Thanks.

## 2016-12-11 NOTE — Telephone Encounter (Signed)
Sent message to [facility].

## 2016-12-17 ENCOUNTER — Encounter: Payer: Self-pay | Admitting: Cardiovascular Disease

## 2016-12-17 ENCOUNTER — Telehealth: Payer: Self-pay | Admitting: Pharmacist

## 2016-12-17 ENCOUNTER — Other Ambulatory Visit: Payer: Self-pay

## 2016-12-17 MED ORDER — FUROSEMIDE 40 MG PO TABS: 40.0000 mg | ORAL_TABLET | Freq: Every day | ORAL | 3 refills | Status: DC

## 2016-12-17 NOTE — Telephone Encounter (Signed)
Patient assistance applications have been sent to [place] for [employer], Humulin U-500, and [place].  Will followup with patient via telephone in 2-3 weeks via telephone.   Bennye Alm, PharmD, [place] PGY2 Pharmacy Resident [phone]

## 2016-12-18 ENCOUNTER — Encounter: Payer: Self-pay | Admitting: Family Medicine

## 2016-12-19 ENCOUNTER — Encounter: Payer: Self-pay | Admitting: Cardiovascular Disease

## 2016-12-19 MED ORDER — FENOFIBRATE 145 MG PO TABS: 145.0000 mg | ORAL_TABLET | Freq: Every day | ORAL | 1 refills | Status: DC

## 2016-12-19 MED ORDER — ROSUVASTATIN CALCIUM 40 MG PO TABS: 40.0000 mg | ORAL_TABLET | Freq: Every day | ORAL | 3 refills | Status: DC

## 2016-12-19 NOTE — Telephone Encounter (Signed)
Patient is requesting refills, prior Dr. Rockey Situ filled both of these, please advise, thanks

## 2016-12-21 ENCOUNTER — Other Ambulatory Visit: Payer: Self-pay | Admitting: Family Medicine

## 2016-12-21 ENCOUNTER — Telehealth: Payer: Self-pay

## 2016-12-21 NOTE — Telephone Encounter (Signed)
-----   Message from Georgiana Shore, RN sent at 12/19/2016  5:09 PM EDT ----- Rockey Situ patient that is past due for 1 month follow up from November 2017. Could you call him for an appt?  Thank you!

## 2016-12-21 NOTE — Telephone Encounter (Signed)
l mom to schedule past due f/u with Dr. Rockey Situ

## 2016-12-24 ENCOUNTER — Other Ambulatory Visit: Payer: Self-pay | Admitting: Family Medicine

## 2016-12-24 ENCOUNTER — Telehealth: Payer: Self-pay | Admitting: Pharmacist

## 2016-12-24 DIAGNOSIS — E118 Type 2 diabetes mellitus with unspecified complications: Principal | ICD-10-CM

## 2016-12-24 DIAGNOSIS — Z794 Long term (current) use of insulin: Principal | ICD-10-CM

## 2016-12-24 DIAGNOSIS — IMO0002 Reserved for concepts with insufficient information to code with codable children: Secondary | ICD-10-CM

## 2016-12-24 DIAGNOSIS — E1165 Type 2 diabetes mellitus with hyperglycemia: Secondary | ICD-10-CM

## 2016-12-24 MED ORDER — INSULIN REGULAR HUMAN (CONC) 500 UNIT/ML ~~LOC~~ SOLN: SUBCUTANEOUS | 1 refills | Status: DC

## 2016-12-24 MED ORDER — DAPAGLIFLOZIN PROPANEDIOL 10 MG PO TABS: 10.0000 mg | ORAL_TABLET | Freq: Every day | ORAL | 3 refills | Status: DC

## 2016-12-24 NOTE — Telephone Encounter (Signed)
Last refill was in August of 2017.  Please advise, thanks

## 2016-12-24 NOTE — Telephone Encounter (Signed)
12/24/16  Received notification from AZ[employer]ME (manufacturer for Wilder Glade) and [employer] (manufacturer for Humulin U-500) stating the prescriptions for patient assistance were illegible.  Also received notification from [employer] that his Delene Loll is approved for patient assistance until 12/19/17.  Patient was notified about completed and pending patient assistance applications.  He states he has ~2 weeks of insulin remaining.  Will send Farxiga and Humulin U-500 prescriptions to patient assistance programs per Dr Lacinda Axon.   Bennye Alm, PharmD, [place] PGY2 Pharmacy Resident [phone]

## 2016-12-24 NOTE — Telephone Encounter (Signed)
faxed

## 2016-12-26 ENCOUNTER — Telehealth: Payer: Self-pay | Admitting: Family Medicine

## 2016-12-26 NOTE — Telephone Encounter (Signed)
Lauren from [employer] called in regards to this pt and insulin regular human CONCENTRATED (HUMULIN R) 500 UNIT/ML injection, and needing an updated rx and some changes. Please advise, thank you!  Call Lauren @ [phone]

## 2016-12-31 ENCOUNTER — Other Ambulatory Visit: Payer: Self-pay | Admitting: Family Medicine

## 2016-12-31 DIAGNOSIS — IMO0002 Reserved for concepts with insufficient information to code with codable children: Secondary | ICD-10-CM

## 2016-12-31 DIAGNOSIS — E1165 Type 2 diabetes mellitus with hyperglycemia: Secondary | ICD-10-CM

## 2016-12-31 DIAGNOSIS — Z794 Long term (current) use of insulin: Principal | ICD-10-CM

## 2016-12-31 DIAGNOSIS — E118 Type 2 diabetes mellitus with unspecified complications: Principal | ICD-10-CM

## 2016-12-31 MED ORDER — INSULIN REGULAR HUMAN (CONC) 500 UNIT/ML ~~LOC~~ SOLN: SUBCUTANEOUS | 0 refills | Status: DC

## 2016-12-31 NOTE — Telephone Encounter (Signed)
Rx given to PharmD

## 2016-12-31 NOTE — Telephone Encounter (Addendum)
Called [employer] Patient Assistance.  They will not approve patient for U-500 unless "via insulin pump" is taken off of prescription as this is off label. Called patient to discuss pump and he states he has a Medtronic Minimed 630G.  Patient denies hypoglycemia episodes and states his lowest CBG of 79 mg/dL. He states he uses 1 vial of U-500 (5000 units) in 1 week.  Patient is unable to afford followup with Dr Gabriel Carina at this time but hopes he can have insurance in the next couple months.  Explained to him Dr Lacinda Axon will send rx to [facility] but refills will need to be done by Dr Gabriel Carina as she is managing his insulin pump.  Dr Lacinda Axon will be assisting as patient is currently uninsured and only has 3 weeks remaining of insulin in order to prevent non-adherence and potential DKA if he runs out of insulin.   Will forward to Dr Lacinda Axon for paper rx to be faxed to [facility] Patient Assistance

## 2017-01-07 MED ORDER — INSULIN REGULAR HUMAN (CONC) 500 UNIT/ML ~~LOC~~ SOLN: SUBCUTANEOUS | 0 refills | Status: DC

## 2017-01-07 NOTE — Telephone Encounter (Signed)
01/07/17  Received fax from [place] requiring "Vials" to be added to prescription for U-500.  Have re-faxed updated rx to [facility].  Called patient and left voicemail asking him to return my call.    Bennye Alm, PharmD, [place] PGY2 Pharmacy Resident [phone]

## 2017-01-07 NOTE — Telephone Encounter (Signed)
Pt called about not having heard from [place]. Pt states he was told to inform you. Thank you!  Call pt @ [phone]. Msg can be left.

## 2017-01-07 NOTE — Addendum Note (Signed)
Addended by: Carmin Muskrat on: 01/07/2017 10:36 AM   Modules accepted: Orders

## 2017-01-14 ENCOUNTER — Telehealth: Payer: Self-pay | Admitting: Family Medicine

## 2017-01-14 NOTE — Telephone Encounter (Signed)
Called [employer] Patient Assistance regarding Humulin U-500 approval for patient assistance.  They require resend of page 9 of application with provider signature.  Confirmed with [employer] pharmacist the prescription for U-500 would be approved as written and patient likely to be approved within 10 business days.    Also called [employer]Me for [place].  They request new prescription to be faxed with provider DEA and state license number as original application had this information but new prescription faxed to them did not have this info.    Will plan to followup with patient tomorrow via telephone.  Dr Lacinda Axon and/or Hollie Beach can you fax this with the [facility] and [place] number to [employer]Me at [phone]?  You can also include Mr Welcher patient number for [employer]Me which is [phone].  Bennye Alm, PharmD, [place] PGY2 Pharmacy Resident [phone]

## 2017-01-14 NOTE — Addendum Note (Signed)
Addended by: Bennye Alm E on: 01/14/2017 10:08 AM   Modules accepted: Orders

## 2017-01-14 NOTE — Progress Notes (Signed)
Rx re-printed to send to [employer] patient assistance program

## 2017-01-14 NOTE — Telephone Encounter (Signed)
Pt called returning your phone call  

## 2017-01-14 NOTE — Telephone Encounter (Signed)
Pt called back returning your call. Thank you! °

## 2017-01-15 MED ORDER — DAPAGLIFLOZIN PROPANEDIOL 10 MG PO TABS: 10.0000 mg | ORAL_TABLET | Freq: Every day | ORAL | 3 refills | Status: DC

## 2017-01-15 NOTE — Telephone Encounter (Signed)
01/15/17  Called [employer] - Humulin U-500 was approved on 01/14/17 until 01/14/18. Medication should be shipped within 5-10 business days to Dr Geralynn Ochs office.  Called patient to notify him of patient assistance status but he was unavailable. I have left him a voicemail asking him to return my call.  Farxiga patient assistance was submitted yesterday.  Will await results of patient assistance.  Bennye Alm, PharmD, [place] PGY2 Pharmacy Resident [phone]

## 2017-01-15 NOTE — Telephone Encounter (Signed)
01/15/17  Received call from patient.  Notified him of Humulin U-500 approval.  Instructed him that Dr Lacinda Axon has only given him a 90 day supply of the insulin and he should have followup with Dr Gabriel Carina to obtain next U-500 refill.  Patient states he has obtained insurance after searching on the insurance but this insurance does not have prescription drug coverage.  Patient will call pharmacist next week if he has not heard any results regarding the [place] patient assistance approval.

## 2017-01-15 NOTE — Telephone Encounter (Signed)
farxiga rx was faxed with DEA and License #.

## 2017-01-15 NOTE — Addendum Note (Signed)
Addended by: Carmin Muskrat on: 01/15/2017 08:15 AM   Modules accepted: Orders

## 2017-01-18 ENCOUNTER — Telehealth: Payer: Self-pay | Admitting: Family Medicine

## 2017-01-18 NOTE — Telephone Encounter (Signed)
Pt was called to inform him that his insulin was here at the office for him to pick up.

## 2017-01-20 ENCOUNTER — Other Ambulatory Visit: Payer: Self-pay | Admitting: Cardiovascular Disease

## 2017-01-21 MED ORDER — EZETIMIBE 10 MG PO TABS: 10.0000 mg | ORAL_TABLET | Freq: Every day | ORAL | 3 refills | Status: DC

## 2017-01-21 NOTE — Telephone Encounter (Signed)
Called patient to notify him his insulin was ready to be picked up.  Left voicemail to return my call  Bennye Alm, PharmD, [place] PGY2 Pharmacy Resident [phone]

## 2017-01-22 NOTE — Telephone Encounter (Signed)
Received call from patient returning my call.  Patient was notified his insulin is waiting to be picked up.  Patient demonstrated understanding.    Called AZ[employer]Me.  Patient is approved and enrolled through 01/16/2018.  Wilder Glade was shipped to patient yesterday.  Bennye Alm, PharmD, [place] PGY2 Pharmacy Resident [phone]

## 2017-01-28 ENCOUNTER — Encounter: Payer: Self-pay | Admitting: Cardiovascular Disease

## 2017-01-30 ENCOUNTER — Telehealth: Payer: Self-pay | Admitting: Internal Medicine

## 2017-01-30 NOTE — Progress Notes (Signed)
Cardiology Office Note  Date:  02/01/2017   ID:  Brian Williams, DOB 1960/11/07, MRN [id number]  PCP:  [name], DO   Chief Complaint  Patient presents with  . other    OD appointment pt needing refills. Meds reviewed verbally with pt.    HPI:  Brian Williams is a pleasant 56 year old gentleman with history of  coronary artery disease,  long history of smoking,  diabetes type 2 poorly controlled with hemoglobin A1c of 9, followed by endocrine/Dr. Gabriel Carina, cardiac catheterization 12 dating back to 2002,  ischemic cardiomyopathy,  echocardiogram March 2016 showing ejection fraction 20-25%,   sustained VT, VT ablation in [place] at [facility] sleep apnea, on CPAP, generator change in 2016, ICD in place,  previously seen by seen by [facility] cardiology heart failure/transplant team, [name].  who presents for routine follow-up of his coronary artery disease, CABG, September 2017  CABG By Dr. Cyndia Bent September 2017Left internal mammary graft to the LAD SVG to diagonal, SVG to OM, SVG to PDA  In follow-up today he is having Finance issues Periods of time without insurance Pulling money out of his retirement account Wife does not have insurance through her work By most of his medications using [email or web address] Met with the pharmacist in [place] and is receiving some assistance with his non-generic medications  Reports eating and drinking with little restriction, 10 pound weight gain in the past 6 months He is smoking again Worsening lower extremity edema particularly noticeable in the left leg but having it bilaterally.  Problems with depression, helplessness He reports that he is off many of his medications for several weeks, a month  Feels he is slipping back, does not feel as good as he did after the bypass surgery Feels like his heart is weak again, unable to do anything Chronic chest discomfort, same as before  Previous stress test recently showing no ischemia, old inferior  wall scar He is on aspirin Plavix  LDL 65, total chol 171 Previous hemoglobin A1c of 9,   Previous cardiac catheterization results  cath on 05/10/2016 showing a 70% eccentric distal LM stenosis, 80% proximal LAD in-stent restenosis, 80% diagonal stenosis, and tandem 90% proximal and mid RCA stenoses with mild in-stent narrowing. The LVEF is visually 35-45%  EKG shows normal sinus rhythm with rate 84 bpm, nonspecific ST  and T wave abnormality, old inferior MI , consider old anteroseptal MI, No significant change in EKG compared to following his bypass surgery  Other past medical history  Previously reported having memory issues  Feels he is forgetting things, losing attention, poor focus Wife has to continually her mind him about things, his own children have noticed he is forgetful For example he reports when he is driving, often goes wrong direction, forgets to take the turn that he is supposed to [facility] who to talk to about these new symptoms Reports his sleep is poor, sometimes sleeping well, most often sleeping very poorly, only several hours at a time  Denies having any significant change in his symptoms with decreased amiodarone down to 100 mg daily, denies worsening palpitations or tachycardia, no ICD shock/therapy  Previously seen in [place] for his cardiac issues. History dates back to 2002 when he had distal RCA disease. Several catheterizations over the next several years for in-stent restenosis of the distal RCA. Later cath, stent placed to the LAD. Most recent catheterization appears to be April 2011 showing patent LAD and RCA stent.  Despite this, he has had a decline in  his ejection fraction over the past several years requiring defibrillator for ventricular tachycardia. Arrhythmia seems to be controlled on nadolol (was previously on 80 mg twice a day, decreased down to daily secondary to low blood pressure and fatigue)  only had one shock since the  device was placed.   seen by [facility] cardiology heart failure/transplant team, [name]. No medication changes at that time. Creatinine was elevated with BUN, improved on subsequent lab work.   Previous cholesterol per his notes July 2015 showing total cholesterol 185, HDL 19, triglycerides 800, this was on Crestor 40, fenofibrate  PMH:   has a past medical history of AICD (automatic cardioverter/defibrillator) present; Anxiety; Arthritis; Back pain; Cardiomyopathy ([place]); CHF (congestive heart failure) ([place]); Coronary artery disease; Depression; Diabetes mellitus without complication ([place]); GERD (gastroesophageal reflux disease); Headache; History of bronchitis; History of colon polyps; History of hiatal hernia; Hyperlipidemia; Hypertension; MI (myocardial infarction) ([place]) (2001); Obesity; Peripheral neuropathy; Pneumonia; PONV (postoperative nausea and vomiting); Presence of permanent cardiac pacemaker; Shortness of breath dyspnea; Sleep apnea; and Ventricular tachycardia ([place]).  PSH:    Past Surgical History:  Procedure Laterality Date  . BACK SURGERY    . CARDIAC CATHETERIZATION    . CARDIAC CATHETERIZATION Left 05/10/2016   Procedure: Left Heart Cath and Coronary Angiography;  Surgeon: Minna Merritts, MD;  Location: [facility];  Service: Cardiovascular;  Laterality: Left;  . CARDIAC DEFIBRILLATOR PLACEMENT  10/16/2007   ICD Model number 2207-36 serial number [id number]  . CARDIAC ELECTROPHYSIOLOGY STUDY AND ABLATION    . CHOLECYSTECTOMY  2001  . COLONOSCOPY    . CORONARY ANGIOPLASTY WITH STENT PLACEMENT     7 stents  . CORONARY ARTERY BYPASS GRAFT N/A 05/24/2016   Procedure: CORONARY ARTERY BYPASS GRAFTING (CABG) x four , using left internal mammary artery and left leg greater saphenous vein harvested endoscopically;  Surgeon: Gaye Pollack, MD;  Location: [facility];  Service: Open Heart Surgery;  Laterality: N/A;  . EP IMPLANTABLE DEVICE N/A 06/16/2015   Procedure: ICD Generator  Changeout;  Surgeon: Deboraha Sprang, MD;  Location: [facility];  Service: Cardiovascular;  Laterality: N/A;  . ESOPHAGOGASTRODUODENOSCOPY    . INSERT / REPLACE / REMOVE PACEMAKER    . KNEE SURGERY     bilateral knee   . [name] WITHOUT CARDIOVERSION N/A 05/24/2016   Procedure: TRANSESOPHAGEAL ECHOCARDIOGRAM ([name]);  Surgeon: Gaye Pollack, MD;  Location: [facility];  Service: Open Heart Surgery;  Laterality: N/A;  . VASECTOMY      Current Outpatient Prescriptions  Medication Sig Dispense Refill  . ALPRAZolam (XANAX) 1 MG tablet TAKE 1 TABLET BY MOUTH 3 TIMES A DAY AS NEEDED 90 tablet 0  . aspirin EC 81 MG tablet Take 81 mg by mouth daily.  90 tablet   . carvedilol (COREG) 12.5 MG tablet Take 1 tablet (12.5 mg total) by mouth 2 (two) times daily with a meal. (Patient taking differently: Take 6.25 mg by mouth 2 (two) times daily with a meal. ) 60 tablet 1  . clopidogrel (PLAVIX) 75 MG tablet Take 1 tablet (75 mg total) by mouth daily. 90 tablet 3  . dapagliflozin propanediol (FARXIGA) 10 MG TABS tablet Take 10 mg by mouth daily. 90 tablet 3  . ENTRESTO 49-51 MG TAKE 1 TABLET BY MOUTH 2 (TWO) TIMES DAILY. 60 tablet 0  . ezetimibe (ZETIA) 10 MG tablet Take 1 tablet (10 mg total) by mouth daily. 90 tablet 3  . furosemide (LASIX) 40 MG tablet Take  1 tablet (40 mg total) by mouth 2 (two) times daily as needed. 60 tablet 6  . gemfibrozil (LOPID) 600 MG tablet Take 1 tablet (600 mg total) by mouth daily. 90 tablet 3  . ibuprofen (ADVIL,MOTRIN) 200 MG tablet Take 600 mg by mouth every 6 (six) hours as needed.    . insulin regular human CONCENTRATED (HUMULIN R) 500 UNIT/ML injection Continuous rate plus boluses on a sliding scale.    . potassium chloride SA (K-DUR,KLOR-CON) 20 MEQ tablet Take 1 tablet (20 mEq total) by mouth daily. 30 tablet 11  . rosuvastatin (CRESTOR) 40 MG tablet Take 1 tablet (40 mg total) by mouth daily. 90 tablet 3  . sertraline (ZOLOFT) 100 MG tablet Take 2 tablets (200 mg total)  by mouth daily. 180 tablet 1  . spironolactone (ALDACTONE) 25 MG tablet Take 1 tablet (25 mg total) by mouth daily. 90 tablet 3  . sildenafil (REVATIO) 20 MG tablet Take 1 tablet (20 mg total) by mouth 3 (three) times daily as needed. 90 tablet 6   No current facility-administered medications for this visit.      Allergies:   Morphine and related and Naproxen   Social History:  The patient  reports that he has quit smoking. His smoking use included Cigarettes. He has a 12.50 pack-year smoking history. He has never used smokeless tobacco. He reports that he does not drink alcohol or use drugs.   Family History:   family history includes Heart attack (age of onset: 19) in his mother; Heart attack (age of onset: 63) in his father; Heart disease in his maternal grandmother and maternal uncle; Hypertension in his father, maternal uncle, and mother; Stroke in his maternal grandmother.    Review of Systems: Review of Systems  Constitutional: Negative.   Respiratory: Negative.   Cardiovascular: Positive for chest pain.       Chest burning on exertion  Gastrointestinal: Negative.   Musculoskeletal: Negative.   Neurological: Negative.   Psychiatric/Behavioral: Negative.   All other systems reviewed and are negative.    PHYSICAL EXAM: VS:  BP 108/64 (BP Location: Left Arm, Patient Position: Sitting, Cuff Size: Normal)   Pulse 84   Ht 6' (1.829 m)   Wt 294 lb (133.4 kg)   BMI 39.87 kg/m  , BMI Body mass index is 39.87 kg/m. GEN: Well nourished, well developed, in no acute distress, obese  HEENT: normal  Neck: no JVD, carotid bruits, or masses Cardiac: RRR; no murmurs, rubs, or gallops,no edema  Respiratory:  clear to auscultation bilaterally, normal work of breathing GI: soft, nontender, nondistended, + BS MS: no deformity or atrophy , well healed mediastinal scar Skin: warm and dry, no rash Neuro:  Strength and sensation are intact Psych: euthymic mood, full affect    Recent  Labs: 05/22/2016: ALT 24 05/25/2016: Magnesium 2.5 07/26/2016: BUN 18; Creatinine, Ser 0.86; Hemoglobin 12.2; Platelets 196; Potassium 4.1; Sodium 137    Lipid Panel Lab Results  Component Value Date   CHOL 171 10/06/2015   HDL 20.50 (L) 10/06/2015   TRIG (H) 10/06/2015    574.0 Triglyceride is over 400; calculations on Lipids are invalid.      Wt Readings from Last 3 Encounters:  02/01/17 294 lb (133.4 kg)  11/19/16 292 lb (132.5 kg)  08/14/16 284 lb 8 oz (129 kg)       ASSESSMENT AND PLAN:  Congestive dilated cardiomyopathy ([place]) - Plan: EKG 12-Lead Severely depressed ejection fraction, at his baseline Old inferior wall MI,  post bypass Weight up 10 pounds with worsening lower extremity edema consistent with CHF Recommended he moderate his fluid intake, increase Lasix up to 40 mg twice a day Free. Of time he was off many of his medications, now back on his medications  Essential hypertension - Plan: EKG 12-Lead Low blood pressure, will continue current medications Asymptomatic  Chronic systolic heart failure ([place]) - Plan: EKG 12-Lead We'll continue entresto, coreg, Double the Lasix 40 twice a day  Stable angina (HCC) Chronic chest symptoms, Did not complete cardiac rehabilitation secondary to insurance issues Recommended regular walking program  Morbid obesity with BMI of 40.0-44.9, adult ([place]) We have encouraged continued exercise, careful diet management in an effort to lose weight.  Hx of CABG Chronic stable anginal symptoms  OSA on CPAP Recommended compliance with his CPAP  Pure hypercholesterolemia Encouraged him to stay on his Crestor 40 mg daily  Active smoker We have encouraged him to continue to work on weaning his cigarettes and smoking cessation. He will continue to work on this and does not want any assistance with chantix.    Total encounter time more than 25 minutes  Greater than 50% was spent in counseling and coordination of care with the  patient  Disposition:   F/U  6 months   Orders Placed This Encounter  Procedures  . EKG 12-Lead     Signed, Esmond Plants, M.D., Ph.D. 02/01/2017  [place], [place]

## 2017-01-30 NOTE — Telephone Encounter (Signed)
Received records request [employer] , forwarded to [facility] for processing.

## 2017-01-31 ENCOUNTER — Ambulatory Visit: Payer: Self-pay | Admitting: Cardiovascular Disease

## 2017-02-01 ENCOUNTER — Ambulatory Visit (INDEPENDENT_AMBULATORY_CARE_PROVIDER_SITE_OTHER): Payer: Self-pay | Admitting: Cardiovascular Disease

## 2017-02-01 ENCOUNTER — Encounter: Payer: Self-pay | Admitting: Cardiovascular Disease

## 2017-02-01 VITALS — BP 108/64 | HR 84 | Ht 72.0 in | Wt 294.0 lb

## 2017-02-01 DIAGNOSIS — F411 Generalized anxiety disorder: Secondary | ICD-10-CM

## 2017-02-01 DIAGNOSIS — Z794 Long term (current) use of insulin: Secondary | ICD-10-CM

## 2017-02-01 DIAGNOSIS — I5022 Chronic systolic (congestive) heart failure: Secondary | ICD-10-CM

## 2017-02-01 DIAGNOSIS — Z951 Presence of aortocoronary bypass graft: Secondary | ICD-10-CM

## 2017-02-01 DIAGNOSIS — Z95810 Presence of automatic (implantable) cardiac defibrillator: Secondary | ICD-10-CM

## 2017-02-01 DIAGNOSIS — IMO0002 Reserved for concepts with insufficient information to code with codable children: Secondary | ICD-10-CM

## 2017-02-01 DIAGNOSIS — E1142 Type 2 diabetes mellitus with diabetic polyneuropathy: Secondary | ICD-10-CM

## 2017-02-01 DIAGNOSIS — E1165 Type 2 diabetes mellitus with hyperglycemia: Secondary | ICD-10-CM

## 2017-02-01 DIAGNOSIS — I25111 Atherosclerotic heart disease of native coronary artery with angina pectoris with documented spasm: Secondary | ICD-10-CM

## 2017-02-01 DIAGNOSIS — Z6841 Body Mass Index (BMI) 40.0 and over, adult: Secondary | ICD-10-CM

## 2017-02-01 DIAGNOSIS — E118 Type 2 diabetes mellitus with unspecified complications: Secondary | ICD-10-CM

## 2017-02-01 DIAGNOSIS — E7800 Pure hypercholesterolemia, unspecified: Secondary | ICD-10-CM

## 2017-02-01 MED ORDER — POTASSIUM CHLORIDE CRYS ER 20 MEQ PO TBCR: 20.0000 meq | EXTENDED_RELEASE_TABLET | Freq: Every day | ORAL | 11 refills | Status: DC

## 2017-02-01 MED ORDER — FUROSEMIDE 40 MG PO TABS: 40.0000 mg | ORAL_TABLET | Freq: Two times a day (BID) | ORAL | 6 refills | Status: DC | PRN

## 2017-02-01 MED ORDER — SILDENAFIL CITRATE 20 MG PO TABS: 20.0000 mg | ORAL_TABLET | Freq: Three times a day (TID) | ORAL | 6 refills | Status: DC | PRN

## 2017-02-01 NOTE — Patient Instructions (Addendum)
Medication Instructions:   Please take extra lasix after lunch with extra potassium  Take revatio 3 to 5 pills as needed  Labwork:  No new labs needed  Testing/Procedures:  No further testing at this time   I recommend watching educational videos on topics of interest to you at:       [email or web address]  Enter code: HEARTCARE    Follow-Up: It was a pleasure seeing you in the office today. Please call us if you have new issues that need to be addressed before your next appt.  [phone]  Your physician wants you to follow-up in: 6 months.  You will receive a reminder letter in the mail two months in advance. If you don't receive a letter, please call our office to schedule the follow-up appointment.  If you need a refill on your cardiac medications before your next appointment, please call your pharmacy.

## 2017-02-04 ENCOUNTER — Encounter: Payer: Self-pay | Admitting: Cardiovascular Disease

## 2017-02-04 ENCOUNTER — Other Ambulatory Visit: Payer: Self-pay

## 2017-02-05 ENCOUNTER — Encounter: Payer: Self-pay | Admitting: Internal Medicine

## 2017-02-15 ENCOUNTER — Telehealth: Payer: Self-pay | Admitting: Cardiovascular Disease

## 2017-02-15 NOTE — Telephone Encounter (Signed)
Received records reques [employer], forwarded to [facility] for processing.

## 2017-03-14 ENCOUNTER — Telehealth: Payer: Self-pay | Admitting: Cardiovascular Disease

## 2017-03-14 NOTE — Telephone Encounter (Signed)
Spoke with patient he requested ROI to be emailed to him. Sent to [email or web address]

## 2017-03-15 ENCOUNTER — Other Ambulatory Visit: Payer: Self-pay | Admitting: Cardiovascular Disease

## 2017-03-15 ENCOUNTER — Other Ambulatory Visit: Payer: Self-pay | Admitting: Family Medicine

## 2017-03-18 ENCOUNTER — Other Ambulatory Visit: Payer: Self-pay

## 2017-03-18 ENCOUNTER — Encounter: Payer: Self-pay | Admitting: Cardiovascular Disease

## 2017-03-18 MED ORDER — FUROSEMIDE 40 MG PO TABS: 40.0000 mg | ORAL_TABLET | Freq: Two times a day (BID) | ORAL | 6 refills | Status: DC | PRN

## 2017-03-19 ENCOUNTER — Telehealth: Payer: Self-pay | Admitting: Family Medicine

## 2017-03-19 NOTE — Telephone Encounter (Signed)
Pharmacy called and stated that there is a class 1 drug interaction with pt's rosuvastatin (CRESTOR) 40 MG tablet and gemfibrozil (LOPID) 600 MG tablet. Please advise, thank you!  [place], [place]

## 2017-03-19 NOTE — Telephone Encounter (Signed)
Pharmacist at [employer] has been informed of Dr. Lacinda Axon Statement.

## 2017-03-19 NOTE — Telephone Encounter (Signed)
Noted. Needs both drugs.

## 2017-03-26 ENCOUNTER — Encounter: Payer: Self-pay | Admitting: Internal Medicine

## 2017-04-16 ENCOUNTER — Other Ambulatory Visit: Payer: Self-pay | Admitting: Family Medicine

## 2017-04-17 NOTE — Telephone Encounter (Deleted)
Script [facility]ed [facility]

## 2017-04-17 NOTE — Telephone Encounter (Signed)
[facility] to [facility]

## 2017-04-17 NOTE — Telephone Encounter (Signed)
Patient last seen 05/16/16. Ok to refill?

## 2017-04-18 ENCOUNTER — Telehealth: Payer: Self-pay | Admitting: Cardiovascular Disease

## 2017-04-18 NOTE — Telephone Encounter (Signed)
Letter typed, routed, and mailed.

## 2017-04-18 NOTE — Telephone Encounter (Signed)
Patient needs a letter stating that he had CABG 05-24-16 .    No other details needed in letter .   Please mail.

## 2017-04-18 NOTE — Progress Notes (Signed)
To who

## 2017-04-18 NOTE — Telephone Encounter (Signed)
Please send to patient  Via mychart

## 2017-04-22 ENCOUNTER — Encounter: Payer: Self-pay | Admitting: *Deleted

## 2017-04-22 NOTE — Telephone Encounter (Signed)
Pt states the letter that was sent last week, should have read he had his surgery on 05/24/2017, not 06/03/2017. Please call.

## 2017-04-22 NOTE — Telephone Encounter (Signed)
Patient returning call. New letter sent to patient via MyChart with corrected date of 05/24/16. Patient was very understanding and appreciative.

## 2017-04-22 NOTE — Telephone Encounter (Signed)
No answer. Left message to call back.   

## 2017-05-30 ENCOUNTER — Other Ambulatory Visit: Payer: Self-pay | Admitting: Family Medicine

## 2017-05-30 DIAGNOSIS — F419 Anxiety disorder, unspecified: Secondary | ICD-10-CM

## 2017-05-30 DIAGNOSIS — F329 Major depressive disorder, single episode, unspecified: Secondary | ICD-10-CM

## 2017-05-30 DIAGNOSIS — F32A Depression, unspecified: Secondary | ICD-10-CM

## 2017-07-01 ENCOUNTER — Encounter: Payer: Self-pay | Admitting: Cardiovascular Disease

## 2017-07-04 ENCOUNTER — Other Ambulatory Visit: Payer: Self-pay | Admitting: Family Medicine

## 2017-07-04 ENCOUNTER — Other Ambulatory Visit: Payer: Self-pay

## 2017-07-04 ENCOUNTER — Telehealth: Payer: Self-pay | Admitting: Cardiovascular Disease

## 2017-07-04 MED ORDER — CARVEDILOL 12.5 MG PO TABS: 12.5000 mg | ORAL_TABLET | Freq: Two times a day (BID) | ORAL | 1 refills | Status: DC

## 2017-07-04 NOTE — Telephone Encounter (Signed)
Patient has not been seen by Dr. Lacinda Axon in over a year. Please find out how often the patient is taking this medication. Please also get him set up for an establish care visit. I'll consider short-term refill once he has a visit scheduled. He will need to establish care with a new provider to receive future refills.

## 2017-07-04 NOTE — Telephone Encounter (Signed)
Pt called about needing a refill for ALPRAZolam (XANAX) 1 MG tablet.  Pharmacy is [facility], [place]  Call pt @ [phone]. Thank you!

## 2017-07-04 NOTE — Telephone Encounter (Signed)
°*  STAT* If patient is at the pharmacy, call can be transferred to refill team.   1. Which medications need to be refilled? (please list name of each medication and dose if known)  Carvedilol   2. Which pharmacy/location (including street and city if local pharmacy) is medication to be sent to? [facility] in [place]   3. Do they need a 30 day or 90 day supply? 90 day

## 2017-07-04 NOTE — Telephone Encounter (Signed)
Last OV was 05/30/2017, last refill was 04/17/17, #90 with no refills.  No follow up appt, please advise, thanks

## 2017-07-04 NOTE — Telephone Encounter (Signed)
Requested Prescriptions   Signed Prescriptions Disp Refills  . carvedilol (COREG) 12.5 MG tablet 60 tablet 1    Sig: Take 1 tablet (12.5 mg total) by mouth 2 (two) times daily with a meal.    Authorizing Provider: Minna Merritts    Ordering User: Janan Ridge

## 2017-07-05 ENCOUNTER — Telehealth: Payer: Self-pay | Admitting: Cardiovascular Disease

## 2017-07-05 ENCOUNTER — Telehealth: Payer: Self-pay | Admitting: Primary Care

## 2017-07-05 MED ORDER — CARVEDILOL 12.5 MG PO TABS: 12.5000 mg | ORAL_TABLET | Freq: Two times a day (BID) | ORAL | 3 refills | Status: DC

## 2017-07-05 NOTE — Telephone Encounter (Signed)
Spoken and notified patient of Kate's comments. Patient verbalized understanding.  Office visit on 07/08/2017

## 2017-07-05 NOTE — Telephone Encounter (Signed)
Attempted to reach patient, patient needs to establish care with new PCP and then Caryl Bis will consider refilling temporaily.  Please schedule if he calls back, thanks

## 2017-07-05 NOTE — Telephone Encounter (Signed)
Pt states when he went to pick up his Coreg, the directions were different than what he remembers Dr/ Rockey Situ telling him. Please call.

## 2017-07-05 NOTE — Telephone Encounter (Signed)
Patient was a patient of Kate's.  Patient transferred to [facility]  Patient wants to know if he can transfer back to [facility] to get his meds refilled.  Can patient come back? Patient said if he doesn't answer, a detailed message can be left on his voice mail.

## 2017-07-05 NOTE — Telephone Encounter (Signed)
Please notify patient that I will accept him back. I discourage frequent use of Xanax and prescribe it only sparingly as needed. If he's agreeable then please set him up in a 30 minute visit.

## 2017-07-05 NOTE — Telephone Encounter (Signed)
Reviewed all medications with patient and he confirmed dosages and frequencies with no further questions or concerns at this time. He did request that I send in prescription for 90 days due to limited funds. Script sent in and he verbalized understanding to call back for any further needs.

## 2017-07-08 ENCOUNTER — Encounter: Payer: Self-pay | Admitting: Primary Care

## 2017-07-08 ENCOUNTER — Ambulatory Visit (INDEPENDENT_AMBULATORY_CARE_PROVIDER_SITE_OTHER): Payer: Self-pay | Admitting: Primary Care

## 2017-07-08 VITALS — BP 126/70 | HR 89 | Temp 98.1°F | Ht 72.0 in | Wt 280.4 lb

## 2017-07-08 DIAGNOSIS — F329 Major depressive disorder, single episode, unspecified: Secondary | ICD-10-CM | POA: Insufficient documentation

## 2017-07-08 DIAGNOSIS — E118 Type 2 diabetes mellitus with unspecified complications: Secondary | ICD-10-CM

## 2017-07-08 DIAGNOSIS — N529 Male erectile dysfunction, unspecified: Secondary | ICD-10-CM | POA: Insufficient documentation

## 2017-07-08 DIAGNOSIS — E1165 Type 2 diabetes mellitus with hyperglycemia: Secondary | ICD-10-CM

## 2017-07-08 DIAGNOSIS — F411 Generalized anxiety disorder: Secondary | ICD-10-CM

## 2017-07-08 DIAGNOSIS — I10 Essential (primary) hypertension: Secondary | ICD-10-CM

## 2017-07-08 DIAGNOSIS — F331 Major depressive disorder, recurrent, moderate: Secondary | ICD-10-CM

## 2017-07-08 DIAGNOSIS — G4700 Insomnia, unspecified: Secondary | ICD-10-CM

## 2017-07-08 DIAGNOSIS — I5022 Chronic systolic (congestive) heart failure: Secondary | ICD-10-CM

## 2017-07-08 DIAGNOSIS — IMO0002 Reserved for concepts with insufficient information to code with codable children: Secondary | ICD-10-CM

## 2017-07-08 DIAGNOSIS — I420 Dilated cardiomyopathy: Secondary | ICD-10-CM

## 2017-07-08 DIAGNOSIS — E785 Hyperlipidemia, unspecified: Secondary | ICD-10-CM

## 2017-07-08 DIAGNOSIS — Z95810 Presence of automatic (implantable) cardiac defibrillator: Secondary | ICD-10-CM

## 2017-07-08 MED ORDER — GEMFIBROZIL 600 MG PO TABS: 600.0000 mg | ORAL_TABLET | Freq: Every day | ORAL | 0 refills | Status: DC

## 2017-07-08 MED ORDER — TRAZODONE HCL 50 MG PO TABS: 50.0000 mg | ORAL_TABLET | Freq: Every evening | ORAL | 0 refills | Status: DC | PRN

## 2017-07-08 NOTE — Assessment & Plan Note (Signed)
Following with cardiology, continue carvedilol, furosemide and spironolactone.

## 2017-07-08 NOTE — Patient Instructions (Signed)
Use the alprazolam (Xanax) very sparingly as needed for anxiety.   Start Trazodone 50 mg tablets for depression and insomnia. Take 1 tablet by mouth at bedtime.  Schedule a lab only appointment to return fasting for cholesterol check.   It was a pleasure to meet you today! Please don't hesitate to call me with any questions. Welcome to back [facility]!

## 2017-07-08 NOTE — Assessment & Plan Note (Signed)
Using Xanax daily, discouraged use as he's not experiencing any significant improvement and also due to addictive characteristic and long term side effects.  Discussed that I will not support a daily use Rx as we need to work to gain control over anxiety and depression. He will start using Xanax only sparingly. Will trial Trazodone for insomnia and depression as this is the main use of his Xanax.   He will update in 1-2 weeks. Consider adding Wellbutrin for uncontrolled depression and tobacco cessation.

## 2017-07-08 NOTE — Assessment & Plan Note (Signed)
Long discussion today regarding overall health. Strongly encouraged him to stop smoking. Recommended daily activity and healthy diet. Continue plavix, lipid and BP control.

## 2017-07-08 NOTE — Assessment & Plan Note (Signed)
Check lipids. He will return when fasting. Continue current regimen.

## 2017-07-08 NOTE — Progress Notes (Signed)
Subjective:    Patient ID: Brian Williams, male    DOB: June 07, 1961, 56 y.o.   MRN: [id number]  HPI  Brian Williams is a 56 year old male who presents today to transfer care from Dr. Lacinda Axon. He ordinally established care with [place] in 2016 and transferred to Dr. Lacinda Axon soon after. He is currently un-employed and is without health insurance. He gets several of his medications through pharmaceutical companies. He can't afford to follow up with his specialists very often, despite recommendations.   1) Essential Hypertension/CAD/Hyperlipidemia: History of CABG in 2017, after catheterization one month prior. Currently managed on carvedilol, Zetia, gemfibrozil, rosuvastatin, clopidogrel, Entresto. Currently following with cardiology with his last visit being in May. He will be out of his gemfibrozil next week, but cannot afford to refill this until November 16th. His cardiologist would like to see him every 3-4 months, he cannot afford to do this.   2) CHF/Dilated cardiomyopathy/ICD: Currently managed on furosemide, spironolactone. Currently following with cardiology with his last visit being in May 2018. He's not had a pacemaker check in one year. He started smoking again.   3) Type 2 Diabetes: Currently managed on Farxiga, Humulin R 500/ml insulin pump. Currently following with endocrinology with A1c of 9.9 in late September 2018.   4) Anxiety and Depression: Currently managed on sertraline 200 mg for which he's taken for years. Also managed on alprazolam 1 mg. He's taking his Xanax daily given stress from losing his insurance and having no job, and also for insomnia. Symptoms include no self esteem, feeling overwhelmed, anger, lethargy, little motivation to take charge of his health.   Over the past 2-3 months symptoms have become worse. He's tried numerous OTC medications for insomnia without improvement. GAD 7 score of 19 and PHQ 9 score of 21. He does not qualify for "fee for service" psychiatry as he's looked  into this in the past. He denies SI/HI.     Review of Systems  Constitutional: Positive for fatigue.  Eyes: Negative for visual disturbance.  Respiratory:       Exertional shortness of breath  Cardiovascular: Negative for chest pain.  Neurological: Negative for dizziness and headaches.  Psychiatric/Behavioral:       See HPI       Past Medical History:  Diagnosis Date  . AICD (automatic cardioverter/defibrillator) present   . Anxiety    takes Xanax as needed  . Arthritis    back,knees,right shoulder  . Back pain   . Cardiomyopathy ([place])   . CHF (congestive heart failure) (HCC)    takes Lasix and Aldactone daily  . Coronary artery disease    takes Plavix daily  . Depression    takes Zoloft daily  . Diabetes mellitus without complication (HCC)    Humulin R and Farxiga daily.Fasting blood sugar runs 140  . GERD (gastroesophageal reflux disease)   . Headache   . History of bronchitis   . History of colon polyps    benign  . History of hiatal hernia   . Hyperlipidemia    takes Fenofibrate,Crestor, and Zetia daily  . Hypertension    takes Entresto and Coreg daily  . MI (myocardial infarction) ([place]) 2001  . Obesity   . Peripheral neuropathy   . Pneumonia    history of-last time about 14 yrs ago  . PONV (postoperative nausea and vomiting)    after knee surgery 25 yrs ago b/p stayed elevated for a while  . Presence of permanent cardiac pacemaker   .  Shortness of breath dyspnea    with exertion  . Sleep apnea    uses CPAP nightly  . Ventricular tachycardia ([place])    s/p RFCA PVCs 2013     Social History   Social History  . Marital status: Married    Spouse name: N/A  . Number of children: N/A  . Years of education: N/A   Occupational History  . Not on file.   Social History Main Topics  . Smoking status: Former Smoker    Packs/day: 0.50    Years: 25.00    Types: Cigarettes  . Smokeless tobacco: Never Used  . Alcohol use No  . Drug use: No  . Sexual  activity: Not Currently   Other Topics Concern  . Not on file   Social History Narrative   Married.   Moved from [place].   Disabled.       Past Surgical History:  Procedure Laterality Date  . BACK SURGERY    . CARDIAC CATHETERIZATION    . CARDIAC CATHETERIZATION Left 05/10/2016   Procedure: Left Heart Cath and Coronary Angiography;  Surgeon: Minna Merritts, MD;  Location: [facility];  Service: Cardiovascular;  Laterality: Left;  . CARDIAC DEFIBRILLATOR PLACEMENT  10/16/2007   ICD Model number 2207-36 serial number [id number]  . CARDIAC ELECTROPHYSIOLOGY STUDY AND ABLATION    . CHOLECYSTECTOMY  2001  . COLONOSCOPY    . CORONARY ANGIOPLASTY WITH STENT PLACEMENT     7 stents  . CORONARY ARTERY BYPASS GRAFT N/A 05/24/2016   Procedure: CORONARY ARTERY BYPASS GRAFTING (CABG) x four , using left internal mammary artery and left leg greater saphenous vein harvested endoscopically;  Surgeon: Gaye Pollack, MD;  Location: [facility];  Service: Open Heart Surgery;  Laterality: N/A;  . EP IMPLANTABLE DEVICE N/A 06/16/2015   Procedure: ICD Generator Changeout;  Surgeon: Deboraha Sprang, MD;  Location: [facility];  Service: Cardiovascular;  Laterality: N/A;  . ESOPHAGOGASTRODUODENOSCOPY    . INSERT / REPLACE / REMOVE PACEMAKER    . KNEE SURGERY     bilateral knee   . [name] WITHOUT CARDIOVERSION N/A 05/24/2016   Procedure: TRANSESOPHAGEAL ECHOCARDIOGRAM ([name]);  Surgeon: Gaye Pollack, MD;  Location: [facility];  Service: Open Heart Surgery;  Laterality: N/A;  . VASECTOMY      Family History  Problem Relation Age of Onset  . Heart attack Mother 9  . Hypertension Mother   . Heart attack Father 8  . Hypertension Father   . Hypertension Maternal Uncle   . Heart disease Maternal Uncle   . Heart disease Maternal Grandmother   . Stroke Maternal Grandmother     Allergies  Allergen Reactions  . Morphine And Related Nausea And Vomiting  . Naproxen Other (See Comments)    Causes  hyperactivity    Current Outpatient Prescriptions on File Prior to Visit  Medication Sig Dispense Refill  . ALPRAZolam (XANAX) 1 MG tablet TAKE 1 TABLET BY MOUTH 3 TIMES A DAY AS NEEDED 90 tablet 0  . aspirin EC 81 MG tablet Take 81 mg by mouth daily.  90 tablet   . carvedilol (COREG) 12.5 MG tablet Take 1 tablet (12.5 mg total) by mouth 2 (two) times daily with a meal. (Patient taking differently: Take 6.25 mg by mouth 2 (two) times daily with a meal. ) 180 tablet 3  . clopidogrel (PLAVIX) 75 MG tablet Take 1 tablet (75 mg total) by mouth daily. 90 tablet 3  .  dapagliflozin propanediol (FARXIGA) 10 MG TABS tablet Take 10 mg by mouth daily. 90 tablet 3  . ENTRESTO 49-51 MG TAKE 1 TABLET BY MOUTH 2 (TWO) TIMES DAILY. 60 tablet 0  . ezetimibe (ZETIA) 10 MG tablet Take 1 tablet (10 mg total) by mouth daily. 90 tablet 3  . furosemide (LASIX) 40 MG tablet Take 1 tablet (40 mg total) by mouth 2 (two) times daily as needed. 180 tablet 6  . ibuprofen (ADVIL,MOTRIN) 200 MG tablet Take 600 mg by mouth every 6 (six) hours as needed.    . insulin regular human CONCENTRATED (HUMULIN R) 500 UNIT/ML injection Continuous rate plus boluses on a sliding scale.    . potassium chloride SA (K-DUR,KLOR-CON) 20 MEQ tablet Take 1 tablet (20 mEq total) by mouth daily. 30 tablet 11  . rosuvastatin (CRESTOR) 40 MG tablet Take 1 tablet (40 mg total) by mouth daily. 90 tablet 3  . sertraline (ZOLOFT) 100 MG tablet TAKE TWO TABLETS BY MOUTH DAILY 180 tablet 0  . sildenafil (REVATIO) 20 MG tablet Take 1 tablet (20 mg total) by mouth 3 (three) times daily as needed. 90 tablet 6  . spironolactone (ALDACTONE) 25 MG tablet TAKE 1 TABLET (25 MG TOTAL) BY MOUTH DAILY. 90 tablet 3   No current facility-administered medications on file prior to visit.     BP 126/70   Pulse 89   Temp 98.1 F (36.7 C) (Oral)   Ht 6' (1.829 m)   Wt 280 lb 6.4 oz (127.2 kg)   SpO2 96%   BMI 38.03 kg/m    Objective:   Physical Exam    Constitutional: He is oriented to person, place, and time. He appears well-nourished.  Neck: Neck supple.  Cardiovascular: Normal rate and regular rhythm.   Pulmonary/Chest: Effort normal and breath sounds normal. He has no wheezes. He has no rales.  Neurological: He is alert and oriented to person, place, and time.  Skin: Skin is warm and dry.  Psychiatric: He has a normal mood and affect.  Talked a lot about his inability to help himself          Assessment & Plan:

## 2017-07-08 NOTE — Assessment & Plan Note (Signed)
Seems euvolemic today, no s/s of volume overload. Continue diuretics and carvedilol.

## 2017-07-08 NOTE — Assessment & Plan Note (Signed)
Uncontrolled on Zoloft at max dose. PHQ 9 score of 21 today, denies SI/HI. Will start with Trazodone for insomnia, then consider adding Wellbutrin in several weeks.  He refuses to see psychiatry through a walk-in clinic as he states that he doesn't qualify, discussed that there are several "fee for service" offices for which he would qualify.

## 2017-07-08 NOTE — Assessment & Plan Note (Signed)
Strongly encouraged follow up with electrophysiologist and/or at least remote check.

## 2017-07-08 NOTE — Assessment & Plan Note (Signed)
Discussed to refrain from Xanax use given addictive characteristic and long term effects. Discussed that I will not support daily use of Xanax.   Will trial low dose Trazodone for insomnia and uncontrolled depression.  He will update in 1-2 weeks. Consider increasing dose if needed.

## 2017-07-08 NOTE — Assessment & Plan Note (Signed)
Stable in the office today, continue current regimen. 

## 2017-07-08 NOTE — Assessment & Plan Note (Signed)
Recent A1C of 9.9. Strongly encouraged regular follow up with endocrinology.

## 2017-07-10 ENCOUNTER — Encounter: Payer: Self-pay | Admitting: Primary Care

## 2017-07-10 NOTE — Telephone Encounter (Signed)
Please advise as patient is now seeing you please advise, thanks

## 2017-07-10 NOTE — Telephone Encounter (Signed)
Noted. Will address via my chart with patient.

## 2017-07-17 ENCOUNTER — Other Ambulatory Visit (INDEPENDENT_AMBULATORY_CARE_PROVIDER_SITE_OTHER): Payer: Self-pay

## 2017-07-17 DIAGNOSIS — E785 Hyperlipidemia, unspecified: Secondary | ICD-10-CM

## 2017-07-17 LAB — LIPID PANEL
Cholesterol: 128 mg/dL (ref 0–200)
HDL: 24.4 mg/dL — ABNORMAL LOW (ref >39.00)
Total CHOL/HDL Ratio: 5
Triglycerides: 497 mg/dL — ABNORMAL HIGH (ref 0.0–149.0)

## 2017-07-17 LAB — LDL CHOLESTEROL, DIRECT: Direct LDL: 47 mg/dL

## 2017-07-26 ENCOUNTER — Other Ambulatory Visit: Payer: Self-pay | Admitting: Internal Medicine

## 2017-07-26 MED ORDER — TRAZODONE HCL 100 MG PO TABS: 200.0000 mg | ORAL_TABLET | Freq: Every day | ORAL | 0 refills | Status: DC

## 2017-08-06 ENCOUNTER — Encounter: Payer: Self-pay | Admitting: Internal Medicine

## 2017-08-06 ENCOUNTER — Ambulatory Visit (INDEPENDENT_AMBULATORY_CARE_PROVIDER_SITE_OTHER): Payer: Self-pay | Admitting: Internal Medicine

## 2017-08-06 VITALS — BP 102/60 | HR 75 | Ht 72.0 in | Wt 294.0 lb

## 2017-08-06 DIAGNOSIS — Z95810 Presence of automatic (implantable) cardiac defibrillator: Secondary | ICD-10-CM

## 2017-08-06 DIAGNOSIS — I5022 Chronic systolic (congestive) heart failure: Secondary | ICD-10-CM

## 2017-08-06 DIAGNOSIS — I255 Ischemic cardiomyopathy: Secondary | ICD-10-CM

## 2017-08-06 LAB — CUP PACEART INCLINIC DEVICE CHECK
Battery Remaining Longevity: 80 mo
Brady Statistic RA Percent Paced: 0.08 %
Brady Statistic RV Percent Paced: 0.21 %
Date Time Interrogation Session: 20181120135643
HighPow Impedance: 42 Ohm
Implantable Lead Implant Date: 20160929
Implantable Lead Implant Date: 20160929
Implantable Lead Location: 753859
Implantable Lead Location: 753860
Implantable Lead Model: 7071
Implantable Pulse Generator Implant Date: 20160929
Lead Channel Impedance Value: 400 Ohm
Lead Channel Impedance Value: 437.5 Ohm
Lead Channel Pacing Threshold Amplitude: 0.75 V
Lead Channel Pacing Threshold Amplitude: 0.75 V
Lead Channel Pacing Threshold Pulse Width: 0.4 ms
Lead Channel Pacing Threshold Pulse Width: 0.4 ms
Lead Channel Sensing Intrinsic Amplitude: 11.7 mV
Lead Channel Sensing Intrinsic Amplitude: 3.1 mV
Lead Channel Setting Pacing Amplitude: 2 V
Lead Channel Setting Pacing Amplitude: 2.5 V
Lead Channel Setting Pacing Pulse Width: 0.4 ms
Lead Channel Setting Sensing Sensitivity: 0.5 mV
Pulse Gen Serial Number: 7306375

## 2017-08-06 NOTE — Patient Instructions (Addendum)
Medication Instructions:  Your physician recommends that you continue on your current medications as directed. Please refer to the Current Medication list given to you today.   Labwork: none  Testing/Procedures: Remote monitoring is used to monitor your Pacemaker of ICD from home. This monitoring reduces the number of office visits required to check your device to one time per year. It allows [place] to keep an eye on the functioning of your device to ensure it is working properly. You are scheduled for a device check from home in six months.  After your physician reviews your transmission, you will receive a postcard with your next transmission date.    Follow-Up: Your physician wants you to follow-up in 1 year with Dr. Caryl Comes.  You will receive a reminder letter in the mail two months in advance. If you don't receive a letter, please call our office to schedule the follow-up appointment.   Any Other Special Instructions Will Be Listed Below (If Applicable).     If you need a refill on your cardiac medications before your next appointment, please call your pharmacy.

## 2017-08-06 NOTE — Progress Notes (Signed)
Patient Care Team: Pleas Koch, NP as PCP - General (Internal Medicine) Minna Merritts, MD as Consulting Physician (Cardiology) Radene Knee Ladell Pier, MD as Consulting Physician (Cardiology)   HPI  Brian Williams is a 56 y.o. male Seen in follow-up for ICD implanted for secondary prevention after holter demonstrated VT.  Also with PVCs, both for which he has undergone ablation 2013 . His device reached ERI and underwent generator replacement 9/16. He has an underlying history of ischemic heart disease.  He has had multiple caths and PCI  Followed previously at [facility] and then at [facility] with amio  Subsequently discontinued    He underwent catheterization and bypass 9/17 by Dr. Buckner Malta  He has ongoing issues with shortness of breath and peripheral edema.  He continues to smoke.  He has intermittent problems with chest pain.  He has treated his sleep apnea   DATE TEST    2015 Myoview   EF 35 %   9/17 Echo([facility]) EF 35%   9/17 Cath   Severe proximal LAD disease (ISR) Severe proximal and mid RAC disease Severe OM2 disease  Severe proximal diagonal #1 disease Moderate to severe left main disease  11/17 Myoview   EF 28 % No Ischemia        Date Cr K Mg  11/17  0./86 4.1 2.5  9/18  0.8 3.7        Device History: ICD implanted  2009 generator replacement 2016 History of appropriate therapy: No History of AAD therapy: Yes    Past Medical History:  Diagnosis Date  . AICD (automatic cardioverter/defibrillator) present   . Anxiety    takes Xanax as needed  . Arthritis    back,knees,right shoulder  . Back pain   . Cardiomyopathy ([place])   . CHF (congestive heart failure) (HCC)    takes Lasix and Aldactone daily  . Coronary artery disease    takes Plavix daily  . Depression    takes Zoloft daily  . Diabetes mellitus without complication (HCC)    Humulin R and Farxiga daily.Fasting blood sugar runs 140  . GERD (gastroesophageal reflux disease)   . Headache   .  History of bronchitis   . History of colon polyps    benign  . History of hiatal hernia   . Hyperlipidemia    takes Fenofibrate,Crestor, and Zetia daily  . Hypertension    takes Entresto and Coreg daily  . MI (myocardial infarction) ([place]) 2001  . Obesity   . Peripheral neuropathy   . Pneumonia    history of-last time about 14 yrs ago  . PONV (postoperative nausea and vomiting)    after knee surgery 25 yrs ago b/p stayed elevated for a while  . Presence of permanent cardiac pacemaker   . Shortness of breath dyspnea    with exertion  . Sleep apnea    uses CPAP nightly  . Ventricular tachycardia ([place])    s/p RFCA PVCs 2013    Past Surgical History:  Procedure Laterality Date  . BACK SURGERY    . CARDIAC CATHETERIZATION    . CARDIAC DEFIBRILLATOR PLACEMENT  10/16/2007   ICD Model number 2207-36 serial number [id number]  . CARDIAC ELECTROPHYSIOLOGY STUDY AND ABLATION    . CHOLECYSTECTOMY  2001  . COLONOSCOPY    . CORONARY ANGIOPLASTY WITH STENT PLACEMENT     7 stents  . CORONARY ARTERY BYPASS GRAFTING (CABG) x four , using left internal mammary  artery and left leg greater saphenous vein harvested endoscopically N/A 05/24/2016   Performed by Gaye Pollack, MD at [facility]  . ESOPHAGOGASTRODUODENOSCOPY    . ICD Generator Changeout N/A 06/16/2015   Performed by Deboraha Sprang, MD at [facility]  . INSERT / REPLACE / REMOVE PACEMAKER    . KNEE SURGERY     bilateral knee   . Left Heart Cath and Coronary Angiography Left 05/10/2016   Performed by Minna Merritts, MD at [facility]  . TRANSESOPHAGEAL ECHOCARDIOGRAM ([name]) N/A 05/24/2016   Performed by Gaye Pollack, MD at [facility]  . VASECTOMY      Current Outpatient Medications  Medication Sig Dispense Refill  . ALPRAZolam (XANAX) 1 MG tablet TAKE 1 TABLET BY MOUTH 3 TIMES A DAY AS NEEDED 90 tablet 0  . aspirin EC 81 MG tablet Take 81 mg by mouth daily.  90 tablet   . carvedilol (COREG) 12.5 MG tablet Take 1 tablet  (12.5 mg total) by mouth 2 (two) times daily with a meal. (Patient taking differently: Take 6.25 mg by mouth 2 (two) times daily with a meal. ) 180 tablet 3  . clopidogrel (PLAVIX) 75 MG tablet Take 1 tablet (75 mg total) by mouth daily. 90 tablet 3  . dapagliflozin propanediol (FARXIGA) 10 MG TABS tablet Take 10 mg by mouth daily. 90 tablet 3  . ENTRESTO 49-51 MG TAKE 1 TABLET BY MOUTH 2 (TWO) TIMES DAILY. 60 tablet 0  . ezetimibe (ZETIA) 10 MG tablet Take 1 tablet (10 mg total) by mouth daily. 90 tablet 3  . furosemide (LASIX) 40 MG tablet Take 1 tablet (40 mg total) by mouth 2 (two) times daily as needed. 180 tablet 6  . gemfibrozil (LOPID) 600 MG tablet Take 1 tablet (600 mg total) by mouth daily. 90 tablet 0  . ibuprofen (ADVIL,MOTRIN) 200 MG tablet Take 600 mg by mouth every 6 (six) hours as needed.    . insulin regular human CONCENTRATED (HUMULIN R) 500 UNIT/ML injection Continuous rate plus boluses on a sliding scale.    . potassium chloride SA (K-DUR,KLOR-CON) 20 MEQ tablet Take 1 tablet (20 mEq total) by mouth daily. 30 tablet 11  . rosuvastatin (CRESTOR) 40 MG tablet Take 1 tablet (40 mg total) by mouth daily. 90 tablet 3  . sertraline (ZOLOFT) 100 MG tablet TAKE TWO TABLETS BY MOUTH DAILY 180 tablet 0  . sildenafil (REVATIO) 20 MG tablet Take 1 tablet (20 mg total) by mouth 3 (three) times daily as needed. 90 tablet 6  . spironolactone (ALDACTONE) 25 MG tablet TAKE 1 TABLET (25 MG TOTAL) BY MOUTH DAILY. 90 tablet 3  . traZODone (DESYREL) 100 MG tablet Take 2 tablets (200 mg total) at bedtime by mouth. 60 tablet 0   No current facility-administered medications for this visit.     Allergies  Allergen Reactions  . Morphine And Related Nausea And Vomiting  . Naproxen Other (See Comments)    Causes hyperactivity      Review of Systems negative except from HPI and PMH  Physical Exam BP 102/60 (BP Location: Left Arm, Patient Position: Sitting, Cuff Size: Large)   Pulse 75   Ht  6' (1.829 m)   Wt 294 lb (133.4 kg)   BMI 39.87 kg/m   Well developed and nourished in no acute distress HENT normal Neck supple with JVP-6-8 Clear Regular rate and rhythm, no murmurs or gallops Abd-soft with active BS No Clubbing  cyanosis 1+edema Skin-warm and dry A & Oriented  Grossly normal sensory and motor function   ECG sinus at 75 Intervals 18/11/40 Incomplete right bundle branch block  Assessment and  Plan Ischemic cardiomyopathy  Implantable defibrillator-[employer]    History of inappropriate therapy  High Risk Medication Surveillance  Tobacco use  Congestive heart failure-chronic-systolic  PVCs  <5%    He is mildly volume overloaded.  We will have him increase his furosemide 80/40 for a couple of times in the next week.  ongoijng chronic exertional chestpain and dyspnea  Neg myoview post CABG   Continue to encourage him to try and stop smoking.  Device function is normal.  Have encouraged exercise.  He cannot afford cardiac rehab as he has no insurance.  Suggested the mall.  High risk medication surveillance showed a normal potassium in September.
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
## 2017-08-12 ENCOUNTER — Other Ambulatory Visit: Payer: Self-pay

## 2017-08-13 ENCOUNTER — Encounter: Payer: Self-pay | Admitting: Primary Care

## 2017-08-13 NOTE — Telephone Encounter (Signed)
See My chart message regarding Wilder Glade, can you help?

## 2017-08-14 ENCOUNTER — Other Ambulatory Visit: Payer: Self-pay | Admitting: Primary Care

## 2017-08-14 DIAGNOSIS — IMO0002 Reserved for concepts with insufficient information to code with codable children: Secondary | ICD-10-CM

## 2017-08-14 DIAGNOSIS — E118 Type 2 diabetes mellitus with unspecified complications: Principal | ICD-10-CM

## 2017-08-14 DIAGNOSIS — E1165 Type 2 diabetes mellitus with hyperglycemia: Secondary | ICD-10-CM

## 2017-08-14 IMAGING — CR DG CHEST 1V PORT
1 series · 1 of 1 positions shown · non-contrast
Comparison: 05/24/2016

CLINICAL DATA: Status post coronary bypass grafting

EXAM:
PORTABLE CHEST 1 VIEW

[AP]
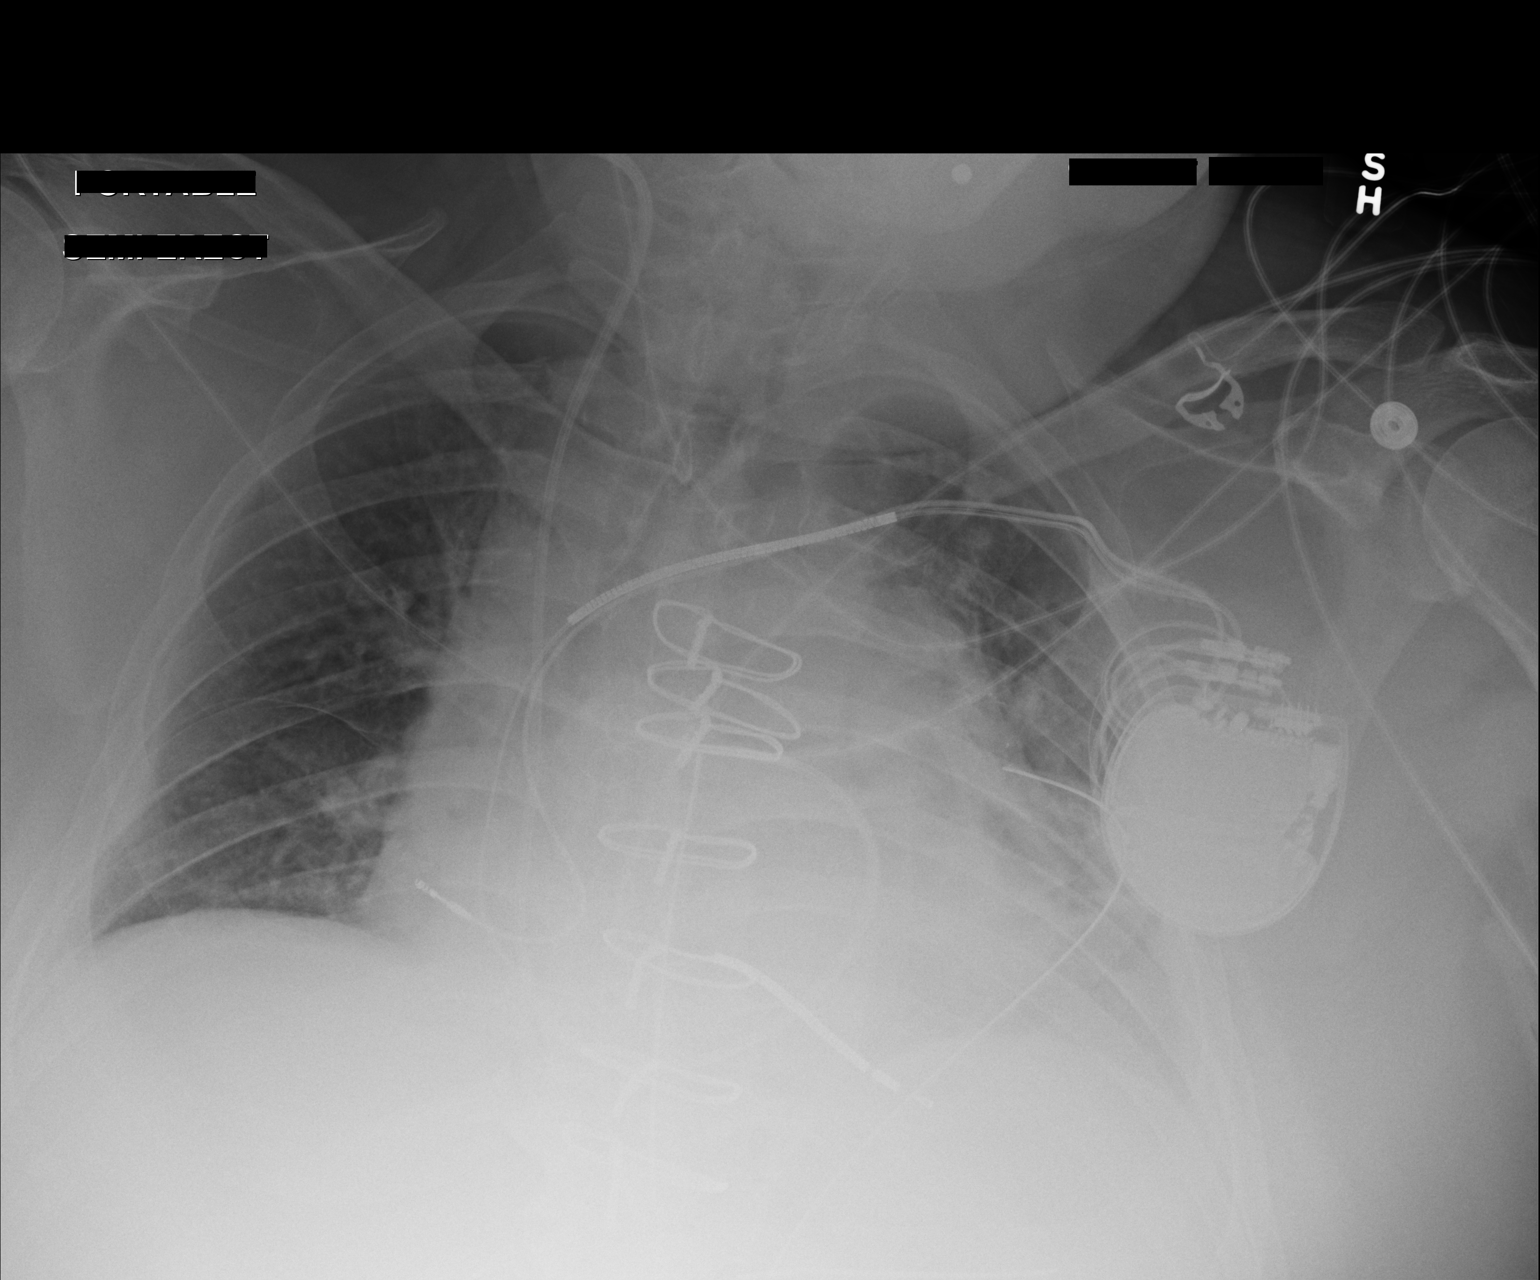

[1 of 1 positions shown; findings below may reference images not displayed]

FINDINGS: Cardiac shadow remains enlarged. Postsurgical changes are again
seen. A defibrillator is again noted and stable. Swan-Ganz catheter
extends into the right pulmonary artery. A mediastinal drain and
left thoracostomy catheter are noted. The endotracheal tube and
nasogastric catheter have been removed. The overall inspiratory
effort is poor with some left basilar atelectasis. No pneumothorax
or sizable effusion is seen.
IMPRESSION: Postsurgical changes with tubes and lines as described above.

Left basilar atelectasis.

## 2017-08-15 IMAGING — CR DG CHEST 1V PORT
1 series · 1 of 1 positions shown · non-contrast
Comparison: Radiograph May 25, 2016.

CLINICAL DATA: Status post coronary artery bypass graft x4.

EXAM:
PORTABLE CHEST 1 VIEW

[AP]
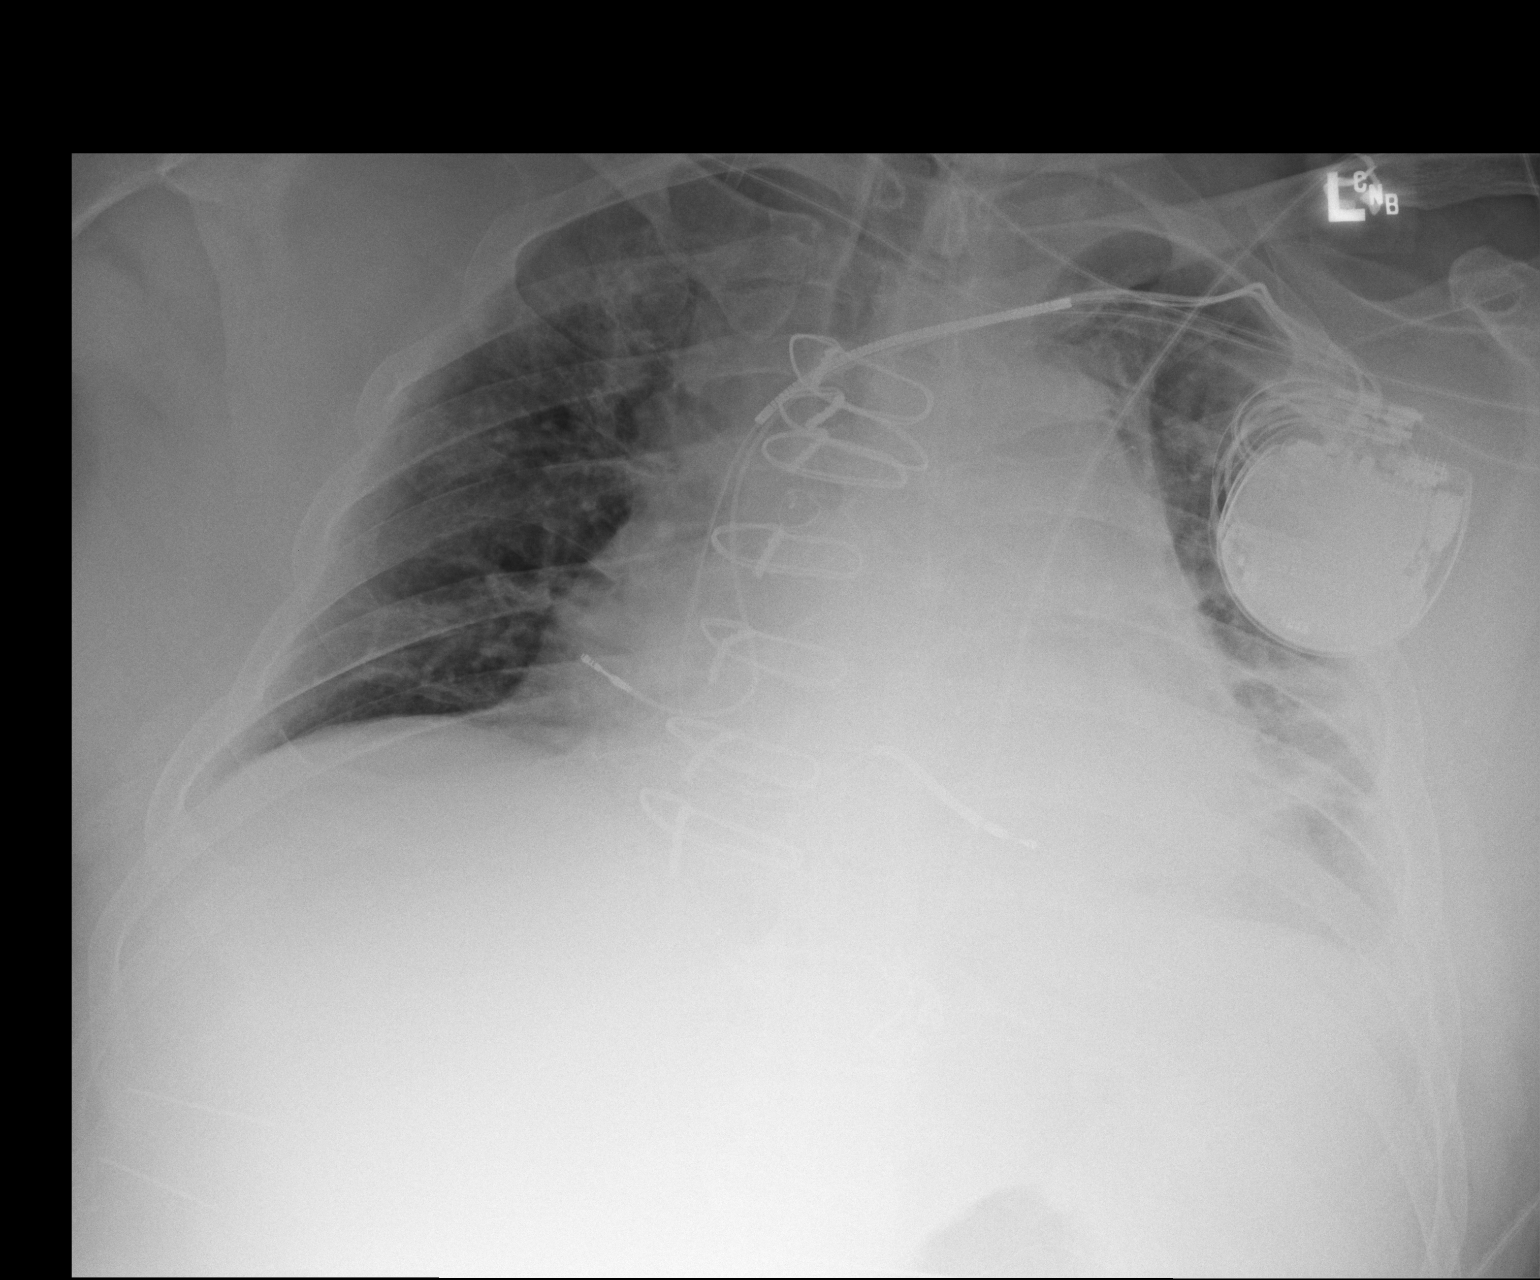

[1 of 1 positions shown; findings below may reference images not displayed]

FINDINGS: Stable cardiomegaly. Left-sided pacemaker is unchanged in position.
Right lung is clear. Left basilar subsegmental atelectasis is noted
with possible pleural effusion. Bony thorax is unremarkable. No
pneumothorax is noted. Right internal jugular catheter has been
removed.
IMPRESSION: Left basilar subsegmental atelectasis with possible pleural
effusion.

## 2017-08-21 ENCOUNTER — Other Ambulatory Visit: Payer: Self-pay | Admitting: Internal Medicine

## 2017-08-21 DIAGNOSIS — G4700 Insomnia, unspecified: Secondary | ICD-10-CM

## 2017-08-22 NOTE — Telephone Encounter (Signed)
Last filled by [facility], please advise

## 2017-08-23 NOTE — Telephone Encounter (Signed)
Looks like dose was increased in November. Refill sent to pharmacy.

## 2017-08-27 ENCOUNTER — Other Ambulatory Visit: Payer: Self-pay | Admitting: Internal Medicine

## 2017-08-27 ENCOUNTER — Other Ambulatory Visit: Payer: Self-pay | Admitting: Primary Care

## 2017-08-27 DIAGNOSIS — G4700 Insomnia, unspecified: Secondary | ICD-10-CM

## 2017-08-28 ENCOUNTER — Encounter: Payer: Self-pay | Admitting: Cardiovascular Disease

## 2017-08-28 ENCOUNTER — Other Ambulatory Visit: Payer: Self-pay | Admitting: Internal Medicine

## 2017-08-28 ENCOUNTER — Other Ambulatory Visit: Payer: Self-pay | Admitting: Family Medicine

## 2017-08-28 DIAGNOSIS — F329 Major depressive disorder, single episode, unspecified: Secondary | ICD-10-CM

## 2017-08-28 DIAGNOSIS — F419 Anxiety disorder, unspecified: Secondary | ICD-10-CM

## 2017-08-28 DIAGNOSIS — F32A Depression, unspecified: Secondary | ICD-10-CM

## 2017-09-03 NOTE — Telephone Encounter (Signed)
Sorry. I though I have placed a note on here. The Wilder Glade that was prescribed by Dr Lacinda Axon was refilled for 90 days supply with 3 refills on 01/15/2017 that was send to [facility].  Patient should not need a refill until 01/2018 and that is usually when he needs to re-apply.

## 2017-09-14 ENCOUNTER — Encounter: Payer: Self-pay | Admitting: Primary Care

## 2017-09-14 ENCOUNTER — Other Ambulatory Visit: Payer: Self-pay | Admitting: Family Medicine

## 2017-09-14 ENCOUNTER — Other Ambulatory Visit: Payer: Self-pay | Admitting: Cardiovascular Disease

## 2017-09-14 DIAGNOSIS — F419 Anxiety disorder, unspecified: Secondary | ICD-10-CM

## 2017-09-14 DIAGNOSIS — F32A Depression, unspecified: Secondary | ICD-10-CM

## 2017-09-14 DIAGNOSIS — F329 Major depressive disorder, single episode, unspecified: Secondary | ICD-10-CM

## 2017-09-16 MED ORDER — SERTRALINE HCL 100 MG PO TABS: 200.0000 mg | ORAL_TABLET | Freq: Every day | ORAL | 1 refills | Status: DC

## 2017-09-16 NOTE — Telephone Encounter (Signed)
Pt no longer under Dr. Lacinda Axon care

## 2017-09-16 NOTE — Telephone Encounter (Signed)
Refill sent to pharmacy.   

## 2017-09-25 ENCOUNTER — Other Ambulatory Visit: Payer: Self-pay | Admitting: *Deleted

## 2017-09-25 ENCOUNTER — Encounter: Payer: Self-pay | Admitting: Internal Medicine

## 2017-09-25 MED ORDER — FUROSEMIDE 40 MG PO TABS: ORAL_TABLET | ORAL | 1 refills | Status: DC

## 2017-09-25 NOTE — Telephone Encounter (Signed)
Requested Prescriptions   Signed Prescriptions Disp Refills  . furosemide (LASIX) 40 MG tablet 270 tablet 1    Sig: Take 2 tablets am and 1 tablet pm daily.    Authorizing Provider: Deboraha Sprang    Ordering User: Britt Bottom

## 2017-09-26 ENCOUNTER — Other Ambulatory Visit: Payer: Self-pay | Admitting: *Deleted

## 2017-09-26 ENCOUNTER — Encounter: Payer: Self-pay | Admitting: *Deleted

## 2017-09-26 DIAGNOSIS — I5022 Chronic systolic (congestive) heart failure: Secondary | ICD-10-CM

## 2017-09-26 DIAGNOSIS — I255 Ischemic cardiomyopathy: Secondary | ICD-10-CM

## 2017-09-26 NOTE — Telephone Encounter (Signed)
Patient returned call.  Scheduled Labs for 1/15 at 945 am.

## 2017-09-30 ENCOUNTER — Encounter: Payer: Self-pay | Admitting: Primary Care

## 2017-09-30 DIAGNOSIS — IMO0002 Reserved for concepts with insufficient information to code with codable children: Secondary | ICD-10-CM

## 2017-09-30 DIAGNOSIS — E118 Type 2 diabetes mellitus with unspecified complications: Principal | ICD-10-CM

## 2017-09-30 DIAGNOSIS — E1165 Type 2 diabetes mellitus with hyperglycemia: Secondary | ICD-10-CM

## 2017-10-01 ENCOUNTER — Other Ambulatory Visit (INDEPENDENT_AMBULATORY_CARE_PROVIDER_SITE_OTHER): Payer: BLUE CROSS/BLUE SHIELD

## 2017-10-01 DIAGNOSIS — I255 Ischemic cardiomyopathy: Secondary | ICD-10-CM | POA: Diagnosis not present

## 2017-10-01 DIAGNOSIS — I5022 Chronic systolic (congestive) heart failure: Secondary | ICD-10-CM

## 2017-10-02 LAB — BASIC METABOLIC PANEL
BUN/Creatinine Ratio: 32 — ABNORMAL HIGH (ref 9–20)
BUN: 19 mg/dL (ref 6–24)
CO2: 17 mmol/L — ABNORMAL LOW (ref 20–29)
Calcium: 9.2 mg/dL (ref 8.7–10.2)
Chloride: 100 mmol/L (ref 96–106)
Creatinine, Ser: 0.6 mg/dL — ABNORMAL LOW (ref 0.76–1.27)
GFR calc Af Amer: 130 mL/min/{1.73_m2} (ref >59)
GFR calc non Af Amer: 112 mL/min/{1.73_m2} (ref >59)
Glucose: 274 mg/dL — ABNORMAL HIGH (ref 65–99)
Potassium: 4.1 mmol/L (ref 3.5–5.2)
Sodium: 138 mmol/L (ref 134–144)

## 2017-10-31 ENCOUNTER — Encounter: Payer: Self-pay | Admitting: Endocrinology

## 2017-10-31 ENCOUNTER — Ambulatory Visit (INDEPENDENT_AMBULATORY_CARE_PROVIDER_SITE_OTHER): Payer: BLUE CROSS/BLUE SHIELD | Admitting: Endocrinology

## 2017-10-31 VITALS — BP 116/72 | HR 85 | Wt 288.6 lb

## 2017-10-31 DIAGNOSIS — E1165 Type 2 diabetes mellitus with hyperglycemia: Secondary | ICD-10-CM | POA: Diagnosis not present

## 2017-10-31 DIAGNOSIS — IMO0002 Reserved for concepts with insufficient information to code with codable children: Secondary | ICD-10-CM

## 2017-10-31 DIAGNOSIS — E118 Type 2 diabetes mellitus with unspecified complications: Secondary | ICD-10-CM

## 2017-10-31 LAB — POCT GLYCOSYLATED HEMOGLOBIN (HGB A1C): Hemoglobin A1C: 9.7

## 2017-10-31 MED ORDER — INSULIN REGULAR HUMAN (CONC) 500 UNIT/ML ~~LOC~~ SOLN: SUBCUTANEOUS | 11 refills | Status: DC

## 2017-10-31 NOTE — Patient Instructions (Addendum)
good diet and exercise significantly improve the control of your diabetes.  please let me know if you wish to be referred to a dietician.  high blood sugar is very risky to your health.  you should see an eye doctor and dentist every year.  It is very important to get all recommended vaccinations.  Controlling your blood pressure and cholesterol drastically reduces the damage diabetes does to your body.  Those who smoke should quit.  Please discuss these with your doctor.  check your blood sugar twice a day.  vary the time of day when you check, between before the 3 meals, and at bedtime.  also check if you have symptoms of your blood sugar being too high or too low.  please keep a record of the readings and bring it to your next appointment here (or you can bring the meter itself).  You can write it on any piece of paper.  please call us sooner if your blood sugar goes below 70, or if you have a lot of readings over 200.   Please take these settings:  Basal of 7 units/hr 7 AM-7PM, and 3 units/HR at other time (ovenight). Please come back for a follow-up appointment in 1 month.    Please call or message [place] next week, to tell us how the blood sugar is doing.

## 2017-10-31 NOTE — Progress Notes (Signed)
Subjective:    Patient ID: Brian Williams, male    DOB: 03-25-1961, 57 y.o.   MRN: [id number]  HPI pt is referred by Alma Friendly, NP, for diabetes.  Pt states DM was dx'ed in 2003; he has mild if any neuropathy of the lower extremities; he has associated CAD; he has been on insulin since 2008, and pump rx (now [employer]onic 630), since 2013; pt says his diet and exercise are fair; he has never had pancreatitis, pancreatic surgery, severe hypoglycemia or DKA. He uses U-500 in his pump, total of approx 120 units per day.  He says he has never had good glycemic control, and that the pump hasn't helped. He seldom has hypoglycemia, and these episodes are mild.   Meter is downloaded today, and the printout is scanned into the record.  It varies from 100-400.  There is no trend throughout the day. Past Medical History:  Diagnosis Date  . AICD (automatic cardioverter/defibrillator) present   . Anxiety    takes Xanax as needed  . Arthritis    back,knees,right shoulder  . Back pain   . Cardiomyopathy ([place])   . CHF (congestive heart failure) (HCC)    takes Lasix and Aldactone daily  . Coronary artery disease    takes Plavix daily  . Depression    takes Zoloft daily  . Diabetes mellitus without complication (HCC)    Humulin R and Farxiga daily.Fasting blood sugar runs 140  . GERD (gastroesophageal reflux disease)   . Headache   . History of bronchitis   . History of colon polyps    benign  . History of hiatal hernia   . Hyperlipidemia    takes Fenofibrate,Crestor, and Zetia daily  . Hypertension    takes Entresto and Coreg daily  . MI (myocardial infarction) ([place]) 2001  . Obesity   . Peripheral neuropathy   . Pneumonia    history of-last time about 14 yrs ago  . PONV (postoperative nausea and vomiting)    after knee surgery 25 yrs ago b/p stayed elevated for a while  . Presence of permanent cardiac pacemaker   . Shortness of breath dyspnea    with exertion  . Sleep apnea    uses CPAP  nightly  . Ventricular tachycardia ([place])    s/p RFCA PVCs 2013    Past Surgical History:  Procedure Laterality Date  . BACK SURGERY    . CARDIAC CATHETERIZATION    . CARDIAC CATHETERIZATION Left 05/10/2016   Procedure: Left Heart Cath and Coronary Angiography;  Surgeon: Minna Merritts, MD;  Location: [place] [facility];  Service: Cardiovascular;  Laterality: Left;  . CARDIAC DEFIBRILLATOR PLACEMENT  10/16/2007   ICD Model number 2207-36 serial number [id number]  . CARDIAC ELECTROPHYSIOLOGY STUDY AND ABLATION    . CHOLECYSTECTOMY  2001  . COLONOSCOPY    . CORONARY ANGIOPLASTY WITH STENT PLACEMENT     7 stents  . CORONARY ARTERY BYPASS GRAFT N/A 05/24/2016   Procedure: CORONARY ARTERY BYPASS GRAFTING (CABG) x four , using left internal mammary artery and left leg greater saphenous vein harvested endoscopically;  Surgeon: Gaye Pollack, MD;  Location: [place] [facility];  Service: Open Heart Surgery;  Laterality: N/A;  . EP IMPLANTABLE DEVICE N/A 06/16/2015   Procedure: ICD Generator Changeout;  Surgeon: Deboraha Sprang, MD;  Location: [place] [facility];  Service: Cardiovascular;  Laterality: N/A;  . ESOPHAGOGASTRODUODENOSCOPY    . INSERT / REPLACE / [place]  bilateral knee   . [name] WITHOUT CARDIOVERSION N/A 05/24/2016   Procedure: TRANSESOPHAGEAL ECHOCARDIOGRAM ([name]);  Surgeon: Gaye Pollack, MD;  Location: [facility];  Service: Open Heart Surgery;  Laterality: N/A;  . VASECTOMY      Social History   Socioeconomic History  . Marital status: Married    Spouse name: Not on file  . Number of children: Not on file  . Years of education: Not on file  . Highest education level: Not on file  Social Needs  . Financial resource strain: Not on file  . Food insecurity - worry: Not on file  . Food insecurity - inability: Not on file  . Transportation needs - medical: Not on file  . Transportation needs - non-medical: Not on file  Occupational History  . Not on file  Tobacco Use   . Smoking status: Former Smoker    Packs/day: 0.50    Years: 25.00    Pack years: 12.50    Types: Cigarettes  . Smokeless tobacco: Never Used  Substance and Sexual Activity  . Alcohol use: No    Alcohol/week: 0.0 - 0.6 oz  . Drug use: No  . Sexual activity: Not Currently  Other Topics Concern  . Not on file  Social History Narrative   Married.   Moved from [place].   Disabled.    Current Outpatient Medications on File Prior to Visit  Medication Sig Dispense Refill  . aspirin EC 81 MG tablet Take 81 mg by mouth daily.  90 tablet   . carvedilol (COREG) 12.5 MG tablet Take 1 tablet (12.5 mg total) by mouth 2 (two) times daily with a meal. (Patient taking differently: Take 6.25 mg by mouth 2 (two) times daily with a meal. ) 180 tablet 3  . clopidogrel (PLAVIX) 75 MG tablet TAKE ONE TABLET BY MOUTH DAILY 90 tablet 3  . dapagliflozin propanediol (FARXIGA) 10 MG TABS tablet Take 10 mg by mouth daily. 90 tablet 3  . ENTRESTO 49-51 MG TAKE 1 TABLET BY MOUTH 2 (TWO) TIMES DAILY. 60 tablet 0  . ezetimibe (ZETIA) 10 MG tablet Take 1 tablet (10 mg total) by mouth daily. 90 tablet 3  . furosemide (LASIX) 40 MG tablet Take 2 tablets am and 1 tablet pm daily. 270 tablet 1  . ibuprofen (ADVIL,MOTRIN) 200 MG tablet Take 600 mg by mouth every 6 (six) hours as needed.    . potassium chloride SA (K-DUR,KLOR-CON) 20 MEQ tablet Take 1 tablet (20 mEq total) by mouth daily. 30 tablet 11  . rosuvastatin (CRESTOR) 40 MG tablet Take 1 tablet (40 mg total) by mouth daily. 90 tablet 3  . sertraline (ZOLOFT) 100 MG tablet Take 2 tablets (200 mg total) by mouth daily. 180 tablet 1  . sildenafil (REVATIO) 20 MG tablet Take 1 tablet (20 mg total) by mouth 3 (three) times daily as needed. 90 tablet 6  . spironolactone (ALDACTONE) 25 MG tablet TAKE 1 TABLET (25 MG TOTAL) BY MOUTH DAILY. 90 tablet 3  . traZODone (DESYREL) 100 MG tablet TAKE 2 TABLETS (200 MG TOTAL) AT BEDTIME BY MOUTH. 180 tablet 0  . traZODone  (DESYREL) 100 MG tablet TAKE 2 TABLETS (200 MG TOTAL) AT BEDTIME BY MOUTH. 180 tablet 0   No current facility-administered medications on file prior to visit.     Allergies  Allergen Reactions  . Morphine And Related Nausea And Vomiting  . Naproxen Other (See Comments)    Causes hyperactivity    Family History  Problem Relation Age of Onset  . Heart attack Mother 29  . Hypertension Mother   . Heart attack Father 103  . Hypertension Father   . Hypertension Maternal Uncle   . Heart disease Maternal Uncle   . Heart disease Maternal Grandmother   . Stroke Maternal Grandmother   . Diabetes Neg Hx     BP 116/72 (BP Location: Left Arm, Patient Position: Sitting, Cuff Size: Normal)   Pulse 85   Wt 288 lb 9.6 oz (130.9 kg)   SpO2 97%   BMI 39.14 kg/m    Review of Systems denies weight loss, blurry vision, headache, chest pain, n/v, cold intolerance, rhinorrhea, and easy bruising.  He has fatigue, leg cramps, difficulty with concentration, depression, excessive diaphoresis, frequent urination, doe, and thirst.      Objective:   Physical Exam VS: see vs page GEN: no distress HEAD: head: no deformity eyes: no periorbital swelling, no proptosis external nose and ears are normal mouth: no lesion seen NECK: supple, thyroid is not enlarged CHEST WALL: no deformity.  Old healed midline surgical scar  LUNGS: clear to auscultation CV: reg rate and rhythm, no murmur ABD: abdomen is soft, nontender.  no hepatosplenomegaly.  not distended.  no hernia.  Insulin pump is in place.  MUSCULOSKELETAL: muscle bulk and strength are grossly normal.  no obvious joint swelling.  gait is normal and steady EXTEMITIES: no deformity.  no ulcer on the feet, but the skin is dry.  feet are of normal color and temp.  1+ bilat leg edema PULSES: dorsalis pedis intact bilat.  no carotid bruit NEURO:  cn 2-12 grossly intact.   readily moves all 4's.  sensation is intact to touch on the feet, but decreased from  normal SKIN:  Normal texture and temperature.  No rash or suspicious lesion is visible.   NODES:  None palpable at the neck.  PSYCH: alert, well-oriented.  Does not appear anxious nor depressed.   Lab Results  Component Value Date   CREATININE 0.60 (L) 10/01/2017   BUN 19 10/01/2017   NA 138 10/01/2017   K 4.1 10/01/2017   CL 100 10/01/2017   CO2 17 (L) 10/01/2017   Lab Results  Component Value Date   HGBA1C 9.7 10/31/2017   I have reviewed outside records, and summarized: Pt was noted to have elevated a1c, and referred here.  He was noted to have chronically suboptimal glycemic control.     Assessment & Plan:  Insulin-requiring type 2 DM, with CAD, new to me: we discussed.  he wants to stay with the pump for now.   Hypoglycemia, with widely variable cbg's.  We'll simplify pump regimen.    Patient Instructions  good diet and exercise significantly improve the control of your diabetes.  please let me know if you wish to be referred to a dietician.  high blood sugar is very risky to your health.  you should see an eye doctor and dentist every year.  It is very important to get all recommended vaccinations.  Controlling your blood pressure and cholesterol drastically reduces the damage diabetes does to your body.  Those who smoke should quit.  Please discuss these with your doctor.  check your blood sugar twice a day.  vary the time of day when you check, between before the 3 meals, and at bedtime.  also check if you have symptoms of your blood sugar being too high or too low.  please keep a record of the readings and  bring it to your next appointment here (or you can bring the meter itself).  You can write it on any piece of paper.  please call us sooner if your blood sugar goes below 70, or if you have a lot of readings over 200.   Please take these settings:  Basal of 7 units/hr 7 AM-7PM, and 3 units/HR at other time (ovenight). Please come back for a follow-up appointment in 1 month.     Please call or message [place] next week, to tell us how the blood sugar is doing.

## 2017-11-01 ENCOUNTER — Other Ambulatory Visit: Payer: Self-pay | Admitting: Primary Care

## 2017-11-01 DIAGNOSIS — E785 Hyperlipidemia, unspecified: Secondary | ICD-10-CM

## 2017-11-06 ENCOUNTER — Other Ambulatory Visit: Payer: Self-pay | Admitting: Cardiovascular Disease

## 2017-11-11 ENCOUNTER — Telehealth: Payer: Self-pay | Admitting: Endocrinology

## 2017-11-11 NOTE — Telephone Encounter (Signed)
LMTCB we have not gotten a form from [employer]. Advised in VM for pt to call and ask for form to be sent

## 2017-11-11 NOTE — Telephone Encounter (Signed)
Patient needs to reprogram pump for prescription. He is stating that [employer] is needing more information from our office in order for them to help him reprogram his pump.    Please advise

## 2017-11-29 ENCOUNTER — Other Ambulatory Visit: Payer: Self-pay | Admitting: Primary Care

## 2017-12-02 ENCOUNTER — Ambulatory Visit: Payer: BLUE CROSS/BLUE SHIELD | Admitting: Endocrinology

## 2017-12-16 ENCOUNTER — Encounter: Payer: Self-pay | Admitting: Primary Care

## 2017-12-19 ENCOUNTER — Other Ambulatory Visit: Payer: Self-pay | Admitting: Cardiovascular Disease

## 2017-12-20 ENCOUNTER — Encounter: Payer: Self-pay | Admitting: Cardiovascular Disease

## 2017-12-23 ENCOUNTER — Other Ambulatory Visit: Payer: Self-pay | Admitting: *Deleted

## 2017-12-23 MED ORDER — SACUBITRIL-VALSARTAN 49-51 MG PO TABS: 1.0000 | ORAL_TABLET | Freq: Two times a day (BID) | ORAL | 6 refills | Status: DC

## 2017-12-27 ENCOUNTER — Telehealth: Payer: Self-pay | Admitting: Cardiovascular Disease

## 2017-12-27 NOTE — Telephone Encounter (Signed)
Pt calling stating his pharmacy won't refill his [facility] for they told patient we need to contact his Insurance   Please advise

## 2018-01-01 NOTE — Telephone Encounter (Signed)
Prior auth KEY ID [id number] submitted via [email or web address] for Entresto 49-51 mg. Awaiting confirmation.  Patient notified that we are awaiting determination.

## 2018-01-01 NOTE — Telephone Encounter (Signed)
Brian Williams was approved from 12/26/17 through 09/16/2038.  S/w Manuela Schwartz, pharmacist at [facility]. She was very helpful. She ran the 30-day free card and it showed patient had already used. She attempted to activate the co-pay card but it kept saying patient does not qualify. She suggested patient to call Brian Williams himself to inquire on the medicine. Otherwise, patient will pay $499 for Brian Williams this time. After his deductible is met he will then pay around $64 a month for Entresto.  Called patient. He states he had been receiving Entresto last year by using the $10 co-pay card. He's already talked to [employer] and they said he could use the same co-pay card as before. I encouraged patient to reach out to his pharmacy and [employer] further in order to obtain his prescription. He was agreeable and very appreciative.

## 2018-01-01 NOTE — Telephone Encounter (Signed)
Please call regarding PA , states it was archived, and closed.

## 2018-01-01 NOTE — Telephone Encounter (Signed)
Please see note below. 

## 2018-01-03 NOTE — Progress Notes (Deleted)
Cardiology Office Note  Date:  01/03/2018   ID:  Brian Williams, DOB [birth year], MRN [id number]  PCP:  Brian Koch, NP   No chief complaint on file.   HPI:  [name] is a pleasant 57 year old gentleman with history of  coronary artery disease,  long history of smoking,  diabetes type 2 poorly controlled with hemoglobin A1c of 9, followed by endocrine/Dr. Gabriel Williams, cardiac catheterization 12 dating back to 2002,  ischemic cardiomyopathy,  echocardiogram March 2016 showing ejection fraction 20-25%,   sustained VT, VT ablation in [place] at Brian [facility] sleep apnea, on CPAP, generator change in 2016, ICD in place,  previously seen by seen by [facility] cardiology heart failure/transplant team, Brian Williams.  who presents for routine follow-up of his coronary artery disease, CABG, September 2017  CABG By Dr. Cyndia Williams September 2017Left internal mammary graft to the LAD SVG to diagonal, SVG to OM, SVG to PDA  In follow-up today he is having Finance issues Periods of time without insurance Pulling money out of his retirement account Wife does not have insurance through her work By most of his medications using [email or web address] Met with the pharmacist in [place] and is receiving some assistance with his non-generic medications  Reports eating and drinking with little restriction, 10 pound weight gain in the past 6 months He is smoking again Worsening lower extremity edema particularly noticeable in the left leg but having it bilaterally.  Problems with depression, helplessness He reports that he is off many of his medications for several weeks, a month  Feels he is slipping back, does not feel as good as he did after the bypass surgery Feels like his heart is weak again, unable to do anything Chronic chest discomfort, same as before  Previous stress test recently showing no ischemia, old inferior wall scar He is on aspirin Plavix  LDL 65, total chol 171 Previous hemoglobin A1c of 9,    Previous cardiac catheterization results  cath on 05/10/2016 showing a 70% eccentric distal LM stenosis, 80% proximal LAD in-stent restenosis, 80% diagonal stenosis, and tandem 90% proximal and mid RCA stenoses with mild in-stent narrowing. The LVEF is visually 35-45%  EKG shows normal sinus rhythm with rate 84 bpm, nonspecific ST  and T wave abnormality, old inferior MI , consider old anteroseptal MI, No significant change in EKG compared to following his bypass surgery  Other past medical history  Previously reported having memory issues  Feels he is forgetting things, losing attention, poor focus Wife has to continually her mind him about things, his own children have noticed he is forgetful For example he reports when he is driving, often goes wrong direction, forgets to take the turn that he is supposed to [facility] who to talk to about these new symptoms Reports his sleep is poor, sometimes sleeping well, most often sleeping very poorly, only several hours at a time  Denies having any significant change in his symptoms with decreased amiodarone down to 100 mg daily, denies worsening palpitations or tachycardia, no ICD shock/therapy  Previously seen in [place] for his cardiac issues. History dates back to 2002 when he had distal RCA disease. Several catheterizations over the next several years for in-stent restenosis of the distal RCA. Later cath, stent placed to the LAD. Most recent catheterization appears to be April 2011 showing patent LAD and RCA stent.  Despite this, he has had a decline in his ejection fraction over the past several years requiring defibrillator for ventricular tachycardia. Arrhythmia seems to be controlled  on nadolol (was previously on 80 mg twice a day, decreased down to daily secondary to low blood pressure and fatigue)  only had one shock since the device was placed.   seen by [facility] cardiology heart failure/transplant team, Brian Williams. No  medication changes at that time. Creatinine was elevated with BUN, improved on subsequent lab work.   Previous cholesterol per his notes July 2015 showing total cholesterol 185, HDL 19, triglycerides 800, this was on Crestor 40, fenofibrate  PMH:   has a past medical history of AICD (automatic cardioverter/defibrillator) present, Anxiety, Arthritis, Back pain, Cardiomyopathy ([place]), CHF (congestive heart failure) ([place]), Coronary artery disease, Depression, Diabetes mellitus without complication ([place]), GERD (gastroesophageal reflux disease), Headache, History of bronchitis, History of colon polyps, History of hiatal hernia, Hyperlipidemia, Hypertension, MI (myocardial infarction) ([place]) (2001), Obesity, Peripheral neuropathy, Pneumonia, PONV (postoperative nausea and vomiting), Presence of permanent cardiac pacemaker, Shortness of breath dyspnea, Sleep apnea, and Ventricular tachycardia ([place]).  PSH:    Past Surgical History:  Procedure Laterality Date  . BACK SURGERY    . CARDIAC CATHETERIZATION    . CARDIAC CATHETERIZATION Left 05/10/2016   Procedure: Left Heart Cath and Coronary Angiography;  Surgeon: Minna Merritts, MD;  Location: [facility];  Service: Cardiovascular;  Laterality: Left;  . CARDIAC DEFIBRILLATOR PLACEMENT  10/16/2007   ICD Model number 2207-36 serial number [id number]  . CARDIAC ELECTROPHYSIOLOGY STUDY AND ABLATION    . CHOLECYSTECTOMY  2001  . COLONOSCOPY    . CORONARY ANGIOPLASTY WITH STENT PLACEMENT     7 stents  . CORONARY ARTERY BYPASS GRAFT N/A 05/24/2016   Procedure: CORONARY ARTERY BYPASS GRAFTING (CABG) x four , using left internal mammary artery and left leg greater saphenous vein harvested endoscopically;  Surgeon: Gaye Pollack, MD;  Location: [facility];  Service: Open Heart Surgery;  Laterality: N/A;  . EP IMPLANTABLE DEVICE N/A 06/16/2015   Procedure: ICD Generator Changeout;  Surgeon: Deboraha Sprang, MD;  Location: [facility];  Service: Cardiovascular;   Laterality: N/A;  . ESOPHAGOGASTRODUODENOSCOPY    . INSERT / REPLACE / REMOVE PACEMAKER    . KNEE SURGERY     bilateral knee   . [name] WITHOUT CARDIOVERSION N/A 05/24/2016   Procedure: TRANSESOPHAGEAL ECHOCARDIOGRAM ([name]);  Surgeon: Gaye Pollack, MD;  Location: [facility];  Service: Open Heart Surgery;  Laterality: N/A;  . VASECTOMY      Current Outpatient Medications  Medication Sig Dispense Refill  . aspirin EC 81 MG tablet Take 81 mg by mouth daily.  90 tablet   . carvedilol (COREG) 12.5 MG tablet Take 1 tablet (12.5 mg total) by mouth 2 (two) times daily with a meal. (Patient taking differently: Take 6.25 mg by mouth 2 (two) times daily with a meal. ) 180 tablet 3  . carvedilol (COREG) 12.5 MG tablet TAKE ONE TABLET BY MOUTH TWICE A DAY WITH MEALS 60 tablet 0  . clopidogrel (PLAVIX) 75 MG tablet TAKE ONE TABLET BY MOUTH DAILY 90 tablet 3  . dapagliflozin propanediol (FARXIGA) 10 MG TABS tablet Take 10 mg by mouth daily. 90 tablet 3  . ezetimibe (ZETIA) 10 MG tablet Take 1 tablet (10 mg total) by mouth daily. 90 tablet 3  . furosemide (LASIX) 40 MG tablet Take 2 tablets am and 1 tablet pm daily. 270 tablet 1  . gemfibrozil (LOPID) 600 MG tablet TAKE ONE TABLET BY MOUTH DAILY 90 tablet 0  . ibuprofen (ADVIL,MOTRIN) 200 MG tablet Take 600 mg by mouth  every 6 (six) hours as needed.    . insulin regular human CONCENTRATED (HUMULIN R) 500 UNIT/ML injection For use in pump, for a total of 130 units/day 20 mL 11  . potassium chloride SA (K-DUR,KLOR-CON) 20 MEQ tablet Take 1 tablet (20 mEq total) by mouth daily. 30 tablet 11  . rosuvastatin (CRESTOR) 40 MG tablet Take 1 tablet (40 mg total) by mouth daily. 90 tablet 3  . sacubitril-valsartan (ENTRESTO) 49-51 MG Take 1 tablet by mouth 2 (two) times daily. 60 tablet 6  . sertraline (ZOLOFT) 100 MG tablet Take 2 tablets (200 mg total) by mouth daily. 180 tablet 1  . sildenafil (REVATIO) 20 MG tablet Take 1 tablet (20 mg total) by mouth 3 (three) times  daily as needed. 90 tablet 6  . spironolactone (ALDACTONE) 25 MG tablet TAKE 1 TABLET (25 MG TOTAL) BY MOUTH DAILY. 90 tablet 3  . traZODone (DESYREL) 100 MG tablet TAKE 2 TABLETS (200MG) BY MOUTH AT BEDTIME 180 tablet 0   No current facility-administered medications for this visit.      Allergies:   Morphine and related and Naproxen   Social History:  The patient  reports that he has quit smoking. His smoking use included cigarettes. He has a 12.50 pack-year smoking history. He has never used smokeless tobacco. He reports that he does not drink alcohol or use drugs.   Family History:   family history includes Heart attack (age of onset: 24) in his mother; Heart attack (age of onset: 41) in his father; Heart disease in his maternal grandmother and maternal uncle; Hypertension in his father, maternal uncle, and mother; Stroke in his maternal grandmother.    Review of Systems: Review of Systems  Constitutional: Negative.   Respiratory: Negative.   Cardiovascular: Positive for chest pain.       Chest burning on exertion  Gastrointestinal: Negative.   Musculoskeletal: Negative.   Neurological: Negative.   Psychiatric/Behavioral: Negative.   All other systems reviewed and are negative.    PHYSICAL EXAM: VS:  There were no vitals taken for this visit. , BMI There is no height or weight on file to calculate BMI. GEN: Well nourished, well developed, in no acute distress, obese  HEENT: normal  Neck: no JVD, carotid bruits, or masses Cardiac: RRR; no murmurs, rubs, or gallops,no edema  Respiratory:  clear to auscultation bilaterally, normal work of breathing GI: soft, nontender, nondistended, + BS MS: no deformity or atrophy , well healed mediastinal scar Skin: warm and dry, no rash Neuro:  Strength and sensation are intact Psych: euthymic mood, full affect    Recent Labs: 10/01/2017: BUN 19; Creatinine, Ser 0.60; Potassium 4.1; Sodium 138    Lipid Panel Lab Results  Component  Value Date   CHOL 128 07/17/2017   HDL 24.40 (L) 07/17/2017   TRIG (H) 07/17/2017    497.0 Triglyceride is over 400; calculations on Lipids are invalid.      Wt Readings from Last 3 Encounters:  10/31/17 288 lb 9.6 oz (130.9 kg)  08/06/17 294 lb (133.4 kg)  07/08/17 280 lb 6.4 oz (127.2 kg)       ASSESSMENT AND PLAN:  Congestive dilated cardiomyopathy ([place]) - Plan: EKG 12-Lead Severely depressed ejection fraction, at his baseline Old inferior wall MI, post bypass Weight up 10 pounds with worsening lower extremity edema consistent with CHF Recommended he moderate his fluid intake, increase Lasix up to 40 mg twice a day Free. Of time he was off many of his  medications, now back on his medications  Essential hypertension - Plan: EKG 12-Lead Low blood pressure, will continue current medications Asymptomatic  Chronic systolic heart failure ([place]) - Plan: EKG 12-Lead We'll continue entresto, coreg, Double the Lasix 40 twice a day  Stable angina (H[facility]) Chronic chest symptoms, Did not complete cardiac rehabilitation secondary to insurance issues Recommended regular walking program  Morbid obesity with BMI of 40.0-44.9, adult ([place]) We have encouraged continued exercise, careful diet management in an effort to lose weight.  Hx of CABG Chronic stable anginal symptoms  OSA on CPAP Recommended compliance with his CPAP  Pure hypercholesterolemia Encouraged him to stay on his Crestor 40 mg daily  Active smoker We have encouraged him to continue to work on weaning his cigarettes and smoking cessation. He will continue to work on this and does not want any assistance with chantix.    Total encounter time more than 25 minutes  Greater than 50% was spent in counseling and coordination of care with the patient  Disposition:   F/U  6 months   No orders of the defined types were placed in this encounter.    Signed, Esmond Plants, M.D., Ph.D. 01/03/2018  [place], [place]

## 2018-01-06 ENCOUNTER — Ambulatory Visit: Payer: BLUE CROSS/BLUE SHIELD | Admitting: Cardiovascular Disease

## 2018-01-10 ENCOUNTER — Telehealth: Payer: Self-pay | Admitting: Internal Medicine

## 2018-01-10 NOTE — Telephone Encounter (Signed)
The patient already has an appointment on 01/14/18 with Dr. Rockey Situ. Will forward to Dr. Rockey Situ and his nurse to review at the patient's office visit.

## 2018-01-10 NOTE — Telephone Encounter (Signed)
° °  [facility] Pre-operative Risk Assessment    Request for surgical clearance:  1. What type of surgery is being performed? Cataract extraction by PE, IOL- Right    2. When is this surgery scheduled? 01-21-18  3. What type of clearance is required (medical clearance vs. Pharmacy clearance to hold med vs. Both)? Medical   4. Are there any medications that need to be held prior to surgery and how long? Not noted on form   5. Practice name and name of physician performing surgery? [facility] Dr. Tama [facility]   6. What is your office phone number  [phone]   7.   What is your office fax number [phone]  8.   Anesthesia type (None, local, MAC, general) ? IV sedation    Clarisse Gouge 01/10/2018, 2:26 PM  _________________________________________________________________   (provider comments below)

## 2018-01-12 NOTE — Telephone Encounter (Signed)
Acceptable risk for surgery No testing needed 

## 2018-01-12 NOTE — Progress Notes (Signed)
Cardiology Office Note  Date:  01/14/2018   ID:  Pieter Fooks, DOB Jan 16, 1961, MRN [id number]  PCP:  Pleas Koch, NP   Chief Complaint  Patient presents with  . OTHER    6 month f/u c/o heart palpitations. Meds reviewed verbally with pt.    HPI:  Mr. Sumlin is a pleasant 57 year old gentleman with history of  coronary artery disease,  long history of smoking,  diabetes type 2 poorly controlled with hemoglobin A1c of 9, followed by endocrine/Dr. Gabriel Carina, cardiac catheterization 12 dating back to 2002,  ischemic cardiomyopathy,  echocardiogram March 2016 showing ejection fraction 20-25%,   sustained VT, VT ablation in [place] at [facility] sleep apnea, on CPAP, generator change in 2016, ICD in place,  previously seen by seen by [facility] cardiology heart failure/transplant team, [name].  who presents for routine follow-up of his coronary artery disease, CABG, September 2017  CABG By Dr. Cyndia Bent September 2017Left internal mammary graft to the LAD SVG to diagonal, SVG to OM, SVG to PDA  In follow-up today he reports that he has SOB with exertion, chronic issue Same as before the CABG Initially felt better after CABG, shortness of breath quickly presented again Reports that his shortness of breath symptoms or  "decline" was instant No significant change since then, over the past year or 2  No regular exercise program He wonders if his chest tightness and shortness of breath is from low ejection fraction and sedentary lifestyle Reports that he spends much of his time in the house Would like to play golf again  Lab work reviewed Total chol 128 HBA1C 9.7  Previously eating and drinking with little restriction, Continues to smoke Chronic lower extremity edema Previous problems with depression, helplessness  Previous stress test results discussed with him in detail  showing no ischemia, old inferior wall scar He is on aspirin Plavix  EKG personally reviewed by myself  on todays visit Shows normal sinus rhythm rate 75 bpm old anterior MI nonspecific ST-T wave abnormality  Other past medical history reviewed Previous cardiac catheterization results  cath on 05/10/2016 showing a 70% eccentric distal LM stenosis, 80% proximal LAD in-stent restenosis, 80% diagonal stenosis, and tandem 90% proximal and mid RCA stenoses with mild in-stent narrowing. The LVEF is visually 35-45%  Previously reported having memory issues  Feels he is forgetting things, losing attention, poor focus Wife has to continually her mind him about things, his own children have noticed he is forgetful For example he reports when he is driving, often goes wrong direction, forgets to take the turn that he is supposed to [facility] who to talk to about these new symptoms Reports his sleep is poor, sometimes sleeping well, most often sleeping very poorly, only several hours at a time  Denies having any significant change in his symptoms with decreased amiodarone down to 100 mg daily, denies worsening palpitations or tachycardia, no ICD shock/therapy  Previously seen in [place] for his cardiac issues. History dates back to 2002 when he had distal RCA disease. Several catheterizations over the next several years for in-stent restenosis of the distal RCA. Later cath, stent placed to the LAD. Most recent catheterization appears to be April 2011 showing patent LAD and RCA stent.  Despite this, he has had a decline in his ejection fraction over the past several years requiring defibrillator for ventricular tachycardia. Arrhythmia seems to be controlled on nadolol (was previously on 80 mg twice a day, decreased down to daily secondary to low blood pressure  and fatigue)  only had one shock since the device was placed.   seen by [facility] cardiology heart failure/transplant team, [name]. No medication changes at that time. Creatinine was elevated with BUN, improved on subsequent lab work.    Previous cholesterol per his notes July 2015 showing total cholesterol 185, HDL 19, triglycerides 800, this was on Crestor 40, fenofibrate  PMH:   has a past medical history of AICD (automatic cardioverter/defibrillator) present, Anxiety, Arthritis, Back pain, Cardiomyopathy ([place]), CHF (congestive heart failure) ([place]), Coronary artery disease, Depression, Diabetes mellitus without complication ([place]), GERD (gastroesophageal reflux disease), Headache, History of bronchitis, History of colon polyps, History of hiatal hernia, Hyperlipidemia, Hypertension, MI (myocardial infarction) ([place]) (2001), Obesity, Peripheral neuropathy, Pneumonia, PONV (postoperative nausea and vomiting), Presence of permanent cardiac pacemaker, Shortness of breath dyspnea, Sleep apnea, and Ventricular tachycardia ([place]).  PSH:    Past Surgical History:  Procedure Laterality Date  . BACK SURGERY    . CARDIAC CATHETERIZATION    . CARDIAC CATHETERIZATION Left 05/10/2016   Procedure: Left Heart Cath and Coronary Angiography;  Surgeon: Minna Merritts, MD;  Location: [place] [facility];  Service: Cardiovascular;  Laterality: Left;  . CARDIAC DEFIBRILLATOR PLACEMENT  10/16/2007   ICD Model number 2207-36 serial number [id number]  . CARDIAC ELECTROPHYSIOLOGY STUDY AND ABLATION    . CHOLECYSTECTOMY  2001  . COLONOSCOPY    . CORONARY ANGIOPLASTY WITH STENT PLACEMENT     7 stents  . CORONARY ARTERY BYPASS GRAFT N/A 05/24/2016   Procedure: CORONARY ARTERY BYPASS GRAFTING (CABG) x four , using left internal mammary artery and left leg greater saphenous vein harvested endoscopically;  Surgeon: Gaye Pollack, MD;  Location: [place] [facility];  Service: Open Heart Surgery;  Laterality: N/A;  . EP IMPLANTABLE DEVICE N/A 06/16/2015   Procedure: ICD Generator Changeout;  Surgeon: Deboraha Sprang, MD;  Location: [place] [facility];  Service: Cardiovascular;  Laterality: N/A;  . ESOPHAGOGASTRODUODENOSCOPY    . INSERT / REPLACE / REMOVE PACEMAKER    . KNEE  SURGERY     bilateral knee   . [name] WITHOUT CARDIOVERSION N/A 05/24/2016   Procedure: TRANSESOPHAGEAL ECHOCARDIOGRAM ([name]);  Surgeon: Gaye Pollack, MD;  Location: [place];  Service: Open Heart Surgery;  Laterality: N/A;  . VASECTOMY      Current Outpatient Medications  Medication Sig Dispense Refill  . aspirin EC 81 MG tablet Take 81 mg by mouth daily.  90 tablet   . carvedilol (COREG) 12.5 MG tablet Take 1 tablet (12.5 mg total) by mouth 2 (two) times daily with a meal. (Patient taking differently: Take 6.25 mg by mouth 2 (two) times daily with a meal. ) 180 tablet 3  . clopidogrel (PLAVIX) 75 MG tablet TAKE ONE TABLET BY MOUTH DAILY 90 tablet 3  . dapagliflozin propanediol (FARXIGA) 10 MG TABS tablet Take 10 mg by mouth daily. 90 tablet 3  . ezetimibe (ZETIA) 10 MG tablet Take 1 tablet (10 mg total) by mouth daily. 90 tablet 3  . furosemide (LASIX) 40 MG tablet Take 2 tablets am and 1 tablet pm daily. 270 tablet 1  . gemfibrozil (LOPID) 600 MG tablet TAKE ONE TABLET BY MOUTH DAILY 90 tablet 0  . ibuprofen (ADVIL,MOTRIN) 200 MG tablet Take 600 mg by mouth every 6 (six) hours as needed.    . insulin regular human CONCENTRATED (HUMULIN R) 500 UNIT/ML injection For use in pump, for a total of 130 units/day (Patient taking differently: For use in pump, for a total  of 170 units/day) 20 mL 11  . potassium chloride SA (K-DUR,KLOR-CON) 20 MEQ tablet Take 1 tablet (20 mEq total) by mouth daily. 30 tablet 11  . rosuvastatin (CRESTOR) 40 MG tablet Take 1 tablet (40 mg total) by mouth daily. 90 tablet 3  . sacubitril-valsartan (ENTRESTO) 49-51 MG Take 1 tablet by mouth 2 (two) times daily. 60 tablet 6  . sertraline (ZOLOFT) 100 MG tablet Take 2 tablets (200 mg total) by mouth daily. 180 tablet 1  . sildenafil (REVATIO) 20 MG tablet Take 1 tablet (20 mg total) by mouth 3 (three) times daily as needed. 90 tablet 6  . spironolactone (ALDACTONE) 25 MG tablet TAKE 1 TABLET (25 MG TOTAL) BY MOUTH DAILY. 90 tablet  3  . traZODone (DESYREL) 100 MG tablet TAKE 2 TABLETS (200MG ) BY MOUTH AT BEDTIME 180 tablet 0   No current facility-administered medications for this visit.      Allergies:   Morphine and related and Naproxen   Social History:  The patient  reports that he has been smoking cigarettes.  He has a 6.25 pack-year smoking history. He has never used smokeless tobacco. He reports that he does not drink alcohol or use drugs.   Family History:   family history includes Heart attack (age of onset: 45) in his mother; Heart attack (age of onset: 80) in his father; Heart disease in his maternal grandmother and maternal uncle; Hypertension in his father, maternal uncle, and mother; Stroke in his maternal grandmother.    Review of Systems: Review of Systems  Constitutional: Negative.   Respiratory: Positive for shortness of breath.   Cardiovascular: Positive for chest pain.       Chest burning on exertion  Gastrointestinal: Negative.   Musculoskeletal: Negative.   Neurological: Negative.   Psychiatric/Behavioral: Negative.   All other systems reviewed and are negative.    PHYSICAL EXAM: VS:  BP 110/60 (BP Location: Left Arm, Patient Position: Sitting, Cuff Size: Large)   Pulse 75   Ht 6' (1.829 m)   Wt 284 lb 12 oz (129.2 kg)   BMI 38.62 kg/m  , BMI Body mass index is 38.62 kg/m. Constitutional:  oriented to person, place, and time. No distress. Obese HENT:  Head: Normocephalic and atraumatic.  Eyes:  no discharge. No scleral icterus.  Neck: Normal range of motion. Neck supple. No JVD present.  Cardiovascular: Normal rate, regular rhythm, normal heart sounds and intact distal pulses. Exam reveals no gallop and no friction rub. No edema No murmur heard. Pulmonary/Chest: Effort normal and breath sounds normal. No stridor. No respiratory distress.  no wheezes.  no rales.  no tenderness.  Abdominal: Soft.  no distension.  no tenderness.  Musculoskeletal: Normal range of motion.  no   tenderness or deformity.  Neurological:  normal muscle tone. Coordination normal. No atrophy Skin: Skin is warm and dry. No rash noted. not diaphoretic.  Psychiatric:  normal mood and affect. behavior is normal. Thought content normal.    Recent Labs: 10/01/2017: BUN 19; Creatinine, Ser 0.60; Potassium 4.1; Sodium 138    Lipid Panel Lab Results  Component Value Date   CHOL 128 07/17/2017   HDL 24.40 (L) 07/17/2017   TRIG (H) 07/17/2017    497.0 Triglyceride is over 400; calculations on Lipids are invalid.      Wt Readings from Last 3 Encounters:  01/14/18 284 lb 12 oz (129.2 kg)  10/31/17 288 lb 9.6 oz (130.9 kg)  08/06/17 294 lb (133.4 kg)  ASSESSMENT AND PLAN:  Congestive dilated cardiomyopathy ([place]) - Plan: EKG 12-Lead Relatively optimal medication regiment will continue carvedilol, Lasix, Entresto, spironolactone Appears relatively euvolemic  Essential hypertension - Plan: EKG 12-Lead Low blood pressure,  Asymptomatic, no changes made  Chronic systolic heart failure ([place]) - Plan: EKG 12-Lead We'll continue entresto, coreg, spironolactone Lasix Chronic shortness of breath, unchanged  Stable angina ([facility]) Chronic chest symptoms, with shortness of breath symptoms Did not complete cardiac rehabilitation secondary to insurance issues Recommended regular walking program Sits in the house, sedentary, no exercise program for conditioning We did discuss ischemic work-up for any further decline in his function concerning for ischemia  Morbid obesity with BMI of 40.0-44.9, adult ([place]) We have encouraged continued exercise, careful diet management in an effort to lose weight. Continues to be an issue, prior history of depression, poor diet  Hx of CABG Chronic stable anginal symptoms As above, stable symptoms  OSA on CPAP Recommended compliance with his CPAP  Pure hypercholesterolemia Encouraged him to stay on his Crestor 40 mg daily Numbers at goal  Active  smoker We have encouraged him to continue to work on weaning his cigarettes and smoking cessation. Long discussion with him today, we will prescribe Chantix More than 5 minutes spent discussing  need to quit smoking   Total encounter time more than 45 minutes  Greater than 50% was spent in counseling and coordination of care with the patient  Disposition:   F/U  6 months   Orders Placed This Encounter  Procedures  . EKG 12-Lead     Signed, Esmond Plants, M.D., Ph.D. 01/14/2018  [place], [place]

## 2018-01-13 NOTE — Telephone Encounter (Signed)
Clearance routed to number listed. 

## 2018-01-14 ENCOUNTER — Telehealth: Payer: Self-pay | Admitting: Cardiovascular Disease

## 2018-01-14 ENCOUNTER — Encounter: Payer: Self-pay | Admitting: Cardiovascular Disease

## 2018-01-14 ENCOUNTER — Ambulatory Visit (INDEPENDENT_AMBULATORY_CARE_PROVIDER_SITE_OTHER): Payer: BLUE CROSS/BLUE SHIELD | Admitting: Cardiovascular Disease

## 2018-01-14 VITALS — BP 110/60 | HR 75 | Ht 72.0 in | Wt 284.8 lb

## 2018-01-14 DIAGNOSIS — I25118 Atherosclerotic heart disease of native coronary artery with other forms of angina pectoris: Secondary | ICD-10-CM

## 2018-01-14 DIAGNOSIS — E7800 Pure hypercholesterolemia, unspecified: Secondary | ICD-10-CM

## 2018-01-14 DIAGNOSIS — I5022 Chronic systolic (congestive) heart failure: Secondary | ICD-10-CM

## 2018-01-14 DIAGNOSIS — I420 Dilated cardiomyopathy: Secondary | ICD-10-CM | POA: Diagnosis not present

## 2018-01-14 DIAGNOSIS — I472 Ventricular tachycardia, unspecified: Secondary | ICD-10-CM

## 2018-01-14 DIAGNOSIS — Z6841 Body Mass Index (BMI) 40.0 and over, adult: Secondary | ICD-10-CM | POA: Diagnosis not present

## 2018-01-14 DIAGNOSIS — E1142 Type 2 diabetes mellitus with diabetic polyneuropathy: Secondary | ICD-10-CM

## 2018-01-14 MED ORDER — VARENICLINE TARTRATE 1 MG PO TABS: 1.0000 mg | ORAL_TABLET | Freq: Two times a day (BID) | ORAL | 2 refills | Status: DC

## 2018-01-14 NOTE — Telephone Encounter (Signed)
Spoke with patient and advised that for disability we typically require forms with any request for necessary testing. He verbalized understanding and stated that he will try to get form for update. He was appreciative for the call with no further questions at this time.

## 2018-01-14 NOTE — Telephone Encounter (Signed)
Patient needs a letter stating he is disabled.  Please call when ready for pick up.

## 2018-01-14 NOTE — Patient Instructions (Signed)

## 2018-01-14 NOTE — Telephone Encounter (Signed)
Patient seen in the office this morning with Dr. Rockey Situ. To Dr. Bluford Kaufmann to review.

## 2018-01-17 ENCOUNTER — Encounter: Payer: Self-pay | Admitting: Primary Care

## 2018-01-17 DIAGNOSIS — F419 Anxiety disorder, unspecified: Secondary | ICD-10-CM

## 2018-01-17 DIAGNOSIS — F329 Major depressive disorder, single episode, unspecified: Secondary | ICD-10-CM

## 2018-01-17 DIAGNOSIS — F32A Depression, unspecified: Secondary | ICD-10-CM

## 2018-01-17 MED ORDER — SERTRALINE HCL 100 MG PO TABS: 200.0000 mg | ORAL_TABLET | Freq: Every day | ORAL | 1 refills | Status: DC

## 2018-01-25 ENCOUNTER — Other Ambulatory Visit: Payer: Self-pay | Admitting: Cardiovascular Disease

## 2018-02-04 ENCOUNTER — Telehealth: Payer: Self-pay | Admitting: *Deleted

## 2018-02-04 ENCOUNTER — Ambulatory Visit (INDEPENDENT_AMBULATORY_CARE_PROVIDER_SITE_OTHER): Payer: BLUE CROSS/BLUE SHIELD | Admitting: *Deleted

## 2018-02-04 DIAGNOSIS — I472 Ventricular tachycardia, unspecified: Secondary | ICD-10-CM

## 2018-02-04 DIAGNOSIS — I255 Ischemic cardiomyopathy: Secondary | ICD-10-CM

## 2018-02-04 NOTE — Telephone Encounter (Signed)
Copied from [place] [id number]. Topic: Inquiry >> Feb 04, 2018 10:58 AM Conception Chancy, NT wrote: Reason for CRM: patient is calling and states that he has a large cyst in his inner thigh and would like to know can Alma Friendly can lance it or does he need to be referred somewhere. He states he has to lay flat he can not sit or else it hurts really bad.

## 2018-02-04 NOTE — Telephone Encounter (Signed)
Please schedule an appointment with me so I can take a look. Thanks.

## 2018-02-04 NOTE — Progress Notes (Signed)
Remote ICD transmission.   

## 2018-02-06 LAB — CUP PACEART REMOTE DEVICE CHECK
Date Time Interrogation Session: 20190523110312
Implantable Lead Implant Date: 20160929
Implantable Lead Implant Date: 20160929
Implantable Lead Location: 753859
Implantable Lead Location: 753860
Implantable Lead Model: 7071
Implantable Pulse Generator Implant Date: 20160929
Pulse Gen Serial Number: 7306375

## 2018-02-11 ENCOUNTER — Encounter: Payer: Self-pay | Admitting: Primary Care

## 2018-02-13 ENCOUNTER — Other Ambulatory Visit: Payer: Self-pay | Admitting: Primary Care

## 2018-02-13 DIAGNOSIS — E785 Hyperlipidemia, unspecified: Secondary | ICD-10-CM

## 2018-02-13 MED ORDER — GEMFIBROZIL 600 MG PO TABS: 600.0000 mg | ORAL_TABLET | Freq: Every day | ORAL | 1 refills | Status: DC

## 2018-02-13 NOTE — Telephone Encounter (Signed)
Noted, refill sent to pharmacy. 

## 2018-02-13 NOTE — Telephone Encounter (Signed)
Ok to refill? Electronically refill request for gemfibrozil (LOPID) 600 MG tablet  Last prescribed on 11/01/2017. Last seen on 07/08/2017

## 2018-02-14 ENCOUNTER — Other Ambulatory Visit: Payer: Self-pay | Admitting: *Deleted

## 2018-02-14 MED ORDER — POTASSIUM CHLORIDE CRYS ER 20 MEQ PO TBCR: 20.0000 meq | EXTENDED_RELEASE_TABLET | Freq: Every day | ORAL | 3 refills | Status: DC

## 2018-02-18 ENCOUNTER — Telehealth: Payer: Self-pay | Admitting: Internal Medicine

## 2018-02-18 ENCOUNTER — Telehealth: Payer: Self-pay | Admitting: Cardiovascular Disease

## 2018-02-18 NOTE — Telephone Encounter (Signed)
Left voicemail message to call back  

## 2018-02-18 NOTE — Telephone Encounter (Signed)
° °  [facility] Pre-operative Risk Assessment    Request for surgical clearance:  1. What type of surgery is being performed? Cataract Extraction by PE, IOL- Right under IV Sedation   2. When is this surgery scheduled? 02/20/18  3. What type of clearance is required (medical clearance vs. Pharmacy clearance to hold med vs. Both)? Medical   4. Are there any medications that need to be held prior to surgery and how long? Not noted   5. Practice name and name of physician performing surgery? [facility]   Dr. Manuella Ghazi   6. What is your office phone number [phone]   7.   What is your office fax number [phone]  8.   Anesthesia type (None, local, MAC, general) ? Iv sedation    Clarisse Gouge 02/18/2018, 4:43 PM  _________________________________________________________________   (provider comments below)

## 2018-02-18 NOTE — Telephone Encounter (Signed)
Freda Munro from [facility] calling States patient was cleared for surgery in April but patient cancelled appointment Would like to know if he can be cleared for surgery on 02/20/18 Clearance request will be faxed - please contact Freda Munro with any questions

## 2018-02-19 NOTE — Telephone Encounter (Signed)
Dr Rockey Situ,  You cleared patient for same procedure in May but patient never had and had to reschedule. Is patient still acceptable risk?  Thanks

## 2018-02-19 NOTE — Telephone Encounter (Signed)
Acceptable risk for procedure Would stay on aspirin Plavix if possible No further testing needed thx TGollan

## 2018-02-19 NOTE — Telephone Encounter (Signed)
Freda Munro with [facility] is calling back regarding clearance

## 2018-02-19 NOTE — Telephone Encounter (Signed)
Clearance faxed via Epic fax to Dr Trena Platt office.

## 2018-02-19 NOTE — Telephone Encounter (Signed)
Left message with Freda Munro that waiting to hear from Dr Rockey Situ regarding clearance.

## 2018-02-20 NOTE — Telephone Encounter (Signed)
Clearance faxed to number provided.

## 2018-02-20 NOTE — Telephone Encounter (Signed)
Dr. Trena Platt office called Unable to receive Epic fax Faxed copy to [phone]

## 2018-02-21 NOTE — Telephone Encounter (Signed)
2 phone notes open for the same encounter. The patient did have a clearance faxed- documented in the 2nd encounter.

## 2018-03-09 ENCOUNTER — Other Ambulatory Visit: Payer: Self-pay | Admitting: Family Medicine

## 2018-03-09 ENCOUNTER — Other Ambulatory Visit: Payer: Self-pay | Admitting: Primary Care

## 2018-04-11 ENCOUNTER — Telehealth: Payer: Self-pay | Admitting: Cardiovascular Disease

## 2018-04-11 NOTE — Telephone Encounter (Signed)
Received records request from [facility], forwarded to [facility] for processing.

## 2018-04-16 NOTE — Telephone Encounter (Signed)
Received additional requests for disability benefits from [employer]. Forwarded to [employer]

## 2018-04-21 ENCOUNTER — Telehealth: Payer: Self-pay | Admitting: Primary Care

## 2018-04-21 NOTE — Telephone Encounter (Signed)
We need more details. What is he needing specifically? Also need sizes.

## 2018-04-21 NOTE — Telephone Encounter (Signed)
Copied from [place] [id number]. Topic: Quick Communication - See Telephone Encounter >> Apr 21, 2018 10:44 AM Percell Belt A wrote: CRM for notification. See Telephone encounter for: 04/21/18.  [facility] called in and requesting order for CPAP supplies  Phone number  [phone] Fax number [phone]

## 2018-04-23 NOTE — Telephone Encounter (Signed)
Completed and placed in Chans inbox. 

## 2018-04-23 NOTE — Telephone Encounter (Signed)
Faxed the form back to [employer]

## 2018-04-23 NOTE — Telephone Encounter (Signed)
Placed the form in Kate's inbox to review.

## 2018-04-23 NOTE — Telephone Encounter (Signed)
[facility] called in they stated that the pt ordered Quattro Full face w/ heated tubing, size medium   CB: [phone]

## 2018-05-01 ENCOUNTER — Other Ambulatory Visit: Payer: Self-pay | Admitting: Internal Medicine

## 2018-05-01 ENCOUNTER — Other Ambulatory Visit: Payer: Self-pay | Admitting: Cardiovascular Disease

## 2018-05-01 NOTE — Telephone Encounter (Signed)
Brian Williams,   Can you look at this one please!

## 2018-05-01 NOTE — Telephone Encounter (Signed)
Please advise if ok to refill chantix.

## 2018-05-06 ENCOUNTER — Telehealth: Payer: Self-pay | Admitting: Cardiology

## 2018-05-06 ENCOUNTER — Encounter: Payer: BLUE CROSS/BLUE SHIELD | Admitting: *Deleted

## 2018-05-06 NOTE — Telephone Encounter (Signed)
LMOVM reminding pt to send remote transmission.   

## 2018-05-07 ENCOUNTER — Encounter: Payer: Self-pay | Admitting: Cardiology

## 2018-05-09 NOTE — Telephone Encounter (Signed)
[employer] forms completed by provider and given to [place]

## 2018-05-09 NOTE — Telephone Encounter (Signed)
Received mail from [facility] placed in nurses bin for completion

## 2018-05-09 NOTE — Telephone Encounter (Signed)
Received paperwork from Nurse Placed in mail for [place] to finish paperwork

## 2018-06-14 ENCOUNTER — Other Ambulatory Visit: Payer: Self-pay | Admitting: Primary Care

## 2018-06-30 ENCOUNTER — Telehealth: Payer: Self-pay | Admitting: Cardiovascular Disease

## 2018-07-01 NOTE — Telephone Encounter (Signed)
Pt requesting 90 day refill on his chantix. Please advise if ok to refill.

## 2018-07-01 NOTE — Telephone Encounter (Signed)
Fine, thx °

## 2018-07-01 NOTE — Telephone Encounter (Signed)
No answer. Left message to call back to discuss if still taking.

## 2018-07-01 NOTE — Telephone Encounter (Signed)
Pt taking 1 tablet BID.

## 2018-07-01 NOTE — Telephone Encounter (Signed)
Patient called back. He needs about 3 more months of Chantix. He has quit but remembers Dr Rockey Situ said to take it for 3 months after quitting. Advised I will send in 3 more months. Routing to Dr Rockey Situ to review and make sure ok.

## 2018-07-08 ENCOUNTER — Ambulatory Visit (INDEPENDENT_AMBULATORY_CARE_PROVIDER_SITE_OTHER): Payer: BLUE CROSS/BLUE SHIELD | Admitting: Primary Care

## 2018-07-08 DIAGNOSIS — L853 Xerosis cutis: Secondary | ICD-10-CM | POA: Insufficient documentation

## 2018-07-08 NOTE — Progress Notes (Signed)
Subjective:    Patient ID: Brian Williams, male    DOB: [birth year], 57 y.o.   MRN: [id number]  HPI  Brian Williams is a 57 year old male with a history of uncontrolled type 2 diabetes, CAD, hypertension, CHF, tobacco, abuse, dry skin to the elbows who presents today with multiple complaints.  1) Foot Discomfort: Several weeks ago he noticed left plantar foot pain just below the pinky toe. His wife accidentally broke some glass and he thought he may have stepped on a tiny. His symptoms resolved last week. He denied redness, swelling, fevers. He has no symptoms today.  2) Dry Skin: Located to the bilateral lower extremities for which he's noticed over the last several years. The dryness began to the bilateral ankles and has moved up to the lower portion of his lower extremities. He denies itching, pain, erythema. He's not applied anything topically to the "dry skin". His skin will "turn black" each time he takes a shower or gets the skin wet.  He's asked four doctors about this and he was told four different answers: fungus, dry skin, and unsure.   He does have a dermatologist and plans on scheduling an appointment.   Review of Systems  Constitutional: Negative for fever.  Skin: Negative for color change, rash and wound.       Past Medical History:  Diagnosis Date  . AICD (automatic cardioverter/defibrillator) present   . Anxiety    takes Xanax as needed  . Arthritis    back,knees,right shoulder  . Back pain   . Cardiomyopathy ([place])   . CHF (congestive heart failure) (HCC)    takes Lasix and Aldactone daily  . Coronary artery disease    takes Plavix daily  . Depression    takes Zoloft daily  . Diabetes mellitus without complication (HCC)    Humulin R and Farxiga daily.Fasting blood sugar runs 140  . GERD (gastroesophageal reflux disease)   . Headache   . History of bronchitis   . History of colon polyps    benign  . History of hiatal hernia   . Hyperlipidemia    takes  Fenofibrate,Crestor, and Zetia daily  . Hypertension    takes Entresto and Coreg daily  . MI (myocardial infarction) ([place]) 2001  . Obesity   . Peripheral neuropathy   . Pneumonia    history of-last time about 14 yrs ago  . PONV (postoperative nausea and vomiting)    after knee surgery 25 yrs ago b/p stayed elevated for a while  . Presence of permanent cardiac pacemaker   . Shortness of breath dyspnea    with exertion  . Sleep apnea    uses CPAP nightly  . Ventricular tachycardia ([place])    s/p RFCA PVCs 2013     Social History   Socioeconomic History  . Marital status: Married    Spouse name: Not on file  . Number of children: Not on file  . Years of education: Not on file  . Highest education level: Not on file  Occupational History  . Not on file  Social Needs  . Financial resource strain: Not on file  . Food insecurity:    Worry: Not on file    Inability: Not on file  . Transportation needs:    Medical: Not on file    Non-medical: Not on file  Tobacco Use  . Smoking status: Current Every Day Smoker    Packs/day: 0.25    Years: 25.00  Pack years: 6.25    Types: Cigarettes  . Smokeless tobacco: Never Used  Substance and Sexual Activity  . Alcohol use: No    Alcohol/week: 0.0 - 1.0 standard drinks  . Drug use: No  . Sexual activity: Not Currently  Lifestyle  . Physical activity:    Days per week: Not on file    Minutes per session: Not on file  . Stress: Not on file  Relationships  . Social connections:    Talks on phone: Not on file    Gets together: Not on file    Attends religious service: Not on file    Active member of club or organization: Not on file    Attends meetings of clubs or organizations: Not on file    Relationship status: Not on file  . Intimate partner violence:    Fear of current or ex partner: Not on file    Emotionally abused: Not on file    Physically abused: Not on file    Forced sexual activity: Not on file  Other Topics  Concern  . Not on file  Social History Narrative   Married.   Moved from [place].   Disabled.    Past Surgical History:  Procedure Laterality Date  . BACK SURGERY    . CARDIAC CATHETERIZATION    . CARDIAC CATHETERIZATION Left 05/10/2016   Procedure: Left Heart Cath and Coronary Angiography;  Surgeon: Minna Merritts, MD;  Location: [facility];  Service: Cardiovascular;  Laterality: Left;  . CARDIAC DEFIBRILLATOR PLACEMENT  10/16/2007   ICD Model number 2207-36 serial number [id number]  . CARDIAC ELECTROPHYSIOLOGY STUDY AND ABLATION    . CHOLECYSTECTOMY  2001  . COLONOSCOPY    . CORONARY ANGIOPLASTY WITH STENT PLACEMENT     7 stents  . CORONARY ARTERY BYPASS GRAFT N/A 05/24/2016   Procedure: CORONARY ARTERY BYPASS GRAFTING (CABG) x four , using left internal mammary artery and left leg greater saphenous vein harvested endoscopically;  Surgeon: Gaye Pollack, MD;  Location: [facility];  Service: Open Heart Surgery;  Laterality: N/A;  . EP IMPLANTABLE DEVICE N/A 06/16/2015   Procedure: ICD Generator Changeout;  Surgeon: Deboraha Sprang, MD;  Location: [facility];  Service: Cardiovascular;  Laterality: N/A;  . ESOPHAGOGASTRODUODENOSCOPY    . INSERT / REPLACE / REMOVE PACEMAKER    . KNEE SURGERY     bilateral knee   . [name] WITHOUT CARDIOVERSION N/A 05/24/2016   Procedure: TRANSESOPHAGEAL ECHOCARDIOGRAM ([name]);  Surgeon: Gaye Pollack, MD;  Location: [facility];  Service: Open Heart Surgery;  Laterality: N/A;  . VASECTOMY      Family History  Problem Relation Age of Onset  . Heart attack Mother 5  . Hypertension Mother   . Heart attack Father 6  . Hypertension Father   . Hypertension Maternal Uncle   . Heart disease Maternal Uncle   . Heart disease Maternal Grandmother   . Stroke Maternal Grandmother   . Diabetes Neg Hx     Allergies  Allergen Reactions  . Morphine And Related Nausea And Vomiting  . Naproxen Other (See Comments)    Causes hyperactivity    Current  Outpatient Medications on File Prior to Visit  Medication Sig Dispense Refill  . aspirin EC 81 MG tablet Take 81 mg by mouth daily.  90 tablet   . carvedilol (COREG) 12.5 MG tablet TAKE ONE TABLET BY MOUTH TWICE A DAY WITH MEALS 60 tablet 3  . CHANTIX CONTINUING  MONTH PAK 1 MG tablet TAKE ONE TABLET BY MOUTH TWICE A DAY 60 tablet 2  . clopidogrel (PLAVIX) 75 MG tablet TAKE ONE TABLET BY MOUTH DAILY 90 tablet 3  . ezetimibe (ZETIA) 10 MG tablet Take 1 tablet (10 mg total) by mouth daily. 90 tablet 3  . furosemide (LASIX) 40 MG tablet TAKE TWO TABLETS BY MOUTH EVERY MORNING AND TAKE ONE TABLET BY MOUTH EVERY EVENING 270 tablet 1  . gemfibrozil (LOPID) 600 MG tablet Take 1 tablet (600 mg total) by mouth daily. 90 tablet 1  . ibuprofen (ADVIL,MOTRIN) 200 MG tablet Take 600 mg by mouth every 6 (six) hours as needed.    . insulin regular human CONCENTRATED (HUMULIN R) 500 UNIT/ML injection For use in pump, for a total of 130 units/day (Patient taking differently: For use in pump, for a total of 170 units/day) 20 mL 11  . potassium chloride SA (K-DUR,KLOR-CON) 20 MEQ tablet Take 1 tablet (20 mEq total) by mouth daily. 90 tablet 3  . rosuvastatin (CRESTOR) 40 MG tablet Take 1 tablet (40 mg total) by mouth daily. 90 tablet 3  . sacubitril-valsartan (ENTRESTO) 49-51 MG Take 1 tablet by mouth 2 (two) times daily. 60 tablet 6  . sertraline (ZOLOFT) 100 MG tablet Take 2 tablets (200 mg total) by mouth daily. 180 tablet 1  . spironolactone (ALDACTONE) 25 MG tablet TAKE 1 TABLET (25 MG TOTAL) BY MOUTH DAILY. 90 tablet 3  . traZODone (DESYREL) 100 MG tablet TAKE TWO TABLETS BY MOUTH AT BEDTIME 180 tablet 0  . sildenafil (REVATIO) 20 MG tablet Take 1 tablet (20 mg total) by mouth 3 (three) times daily as needed. (Patient not taking: Reported on 07/08/2018) 90 tablet 6   No current facility-administered medications on file prior to visit.     BP 100/62 (BP Location: Left Arm, Patient Position: Sitting, Cuff  Size: Large)   Pulse 78   Ht 6' (1.829 m)   Wt 291 lb 6.4 oz (132.2 kg)   SpO2 96%   BMI 39.52 kg/m    Objective:   Physical Exam  Constitutional: He appears well-nourished.  Cardiovascular: Normal rate.  Pulses:      Dorsalis pedis pulses are 2+ on the right side, and 2+ on the left side.       Posterior tibial pulses are 2+ on the right side, and 2+ on the left side.  Respiratory: Effort normal.  Skin: Skin is warm and dry. No rash noted. No erythema.  Dry, flesh/yellow colored elephant type skin to bilateral ankles and left medial lower extremity.            Assessment & Plan:  Foot Discomfort:  Occurred several weeks ago, now resolved. Discussed to always wear foot protection in the house and out doors. No evidence of foreign body or infection.  Pleas Koch, NP

## 2018-07-08 NOTE — Assessment & Plan Note (Signed)
Located to bilateral lower extremities for years, no evidence of cellulitis. Could be vascular vs fungal in nature. Discussed use of Aquaphor ointment. He will schedule an appointment with his dermatologist.

## 2018-07-08 NOTE — Patient Instructions (Addendum)
Schedule an appointment with the dermatologist as discussed.  You can try applying Aquaphor ointment to the legs for moisture.   The spots around your mouth appear to be moles/skin tags.   It was a pleasure to see you today!

## 2018-07-21 ENCOUNTER — Encounter: Payer: Self-pay | Admitting: Primary Care

## 2018-07-21 ENCOUNTER — Ambulatory Visit (INDEPENDENT_AMBULATORY_CARE_PROVIDER_SITE_OTHER): Payer: BLUE CROSS/BLUE SHIELD | Admitting: Primary Care

## 2018-07-21 VITALS — BP 102/62 | HR 73 | Temp 98.4°F | Ht 72.0 in | Wt 293.2 lb

## 2018-07-21 DIAGNOSIS — I5022 Chronic systolic (congestive) heart failure: Secondary | ICD-10-CM | POA: Diagnosis not present

## 2018-07-21 DIAGNOSIS — G4700 Insomnia, unspecified: Secondary | ICD-10-CM

## 2018-07-21 DIAGNOSIS — E7800 Pure hypercholesterolemia, unspecified: Secondary | ICD-10-CM

## 2018-07-21 DIAGNOSIS — Z0000 Encounter for general adult medical examination without abnormal findings: Secondary | ICD-10-CM

## 2018-07-21 DIAGNOSIS — E118 Type 2 diabetes mellitus with unspecified complications: Secondary | ICD-10-CM

## 2018-07-21 DIAGNOSIS — Z23 Encounter for immunization: Secondary | ICD-10-CM | POA: Diagnosis not present

## 2018-07-21 DIAGNOSIS — Z125 Encounter for screening for malignant neoplasm of prostate: Secondary | ICD-10-CM

## 2018-07-21 DIAGNOSIS — IMO0002 Reserved for concepts with insufficient information to code with codable children: Secondary | ICD-10-CM

## 2018-07-21 DIAGNOSIS — Z9989 Dependence on other enabling machines and devices: Secondary | ICD-10-CM

## 2018-07-21 DIAGNOSIS — G4733 Obstructive sleep apnea (adult) (pediatric): Secondary | ICD-10-CM | POA: Diagnosis not present

## 2018-07-21 DIAGNOSIS — Z1159 Encounter for screening for other viral diseases: Secondary | ICD-10-CM

## 2018-07-21 DIAGNOSIS — E1165 Type 2 diabetes mellitus with hyperglycemia: Secondary | ICD-10-CM

## 2018-07-21 DIAGNOSIS — E669 Obesity, unspecified: Secondary | ICD-10-CM

## 2018-07-21 DIAGNOSIS — I10 Essential (primary) hypertension: Secondary | ICD-10-CM | POA: Diagnosis not present

## 2018-07-21 DIAGNOSIS — F411 Generalized anxiety disorder: Secondary | ICD-10-CM

## 2018-07-21 DIAGNOSIS — Z95810 Presence of automatic (implantable) cardiac defibrillator: Secondary | ICD-10-CM

## 2018-07-21 DIAGNOSIS — F331 Major depressive disorder, recurrent, moderate: Secondary | ICD-10-CM

## 2018-07-21 LAB — COMPREHENSIVE METABOLIC PANEL
ALT: 27 U/L (ref 0–53)
AST: 20 U/L (ref 0–37)
Albumin: 4.7 g/dL (ref 3.5–5.2)
Alkaline Phosphatase: 59 U/L (ref 39–117)
BUN: 18 mg/dL (ref 6–23)
CO2: 25 mEq/L (ref 19–32)
Calcium: 9.8 mg/dL (ref 8.4–10.5)
Chloride: 103 mEq/L (ref 96–112)
Creatinine, Ser: 0.99 mg/dL (ref 0.40–1.50)
GFR: 82.68 mL/min (ref >60.00)
Glucose, Bld: 237 mg/dL — ABNORMAL HIGH (ref 70–99)
Potassium: 4.1 mEq/L (ref 3.5–5.1)
Sodium: 140 mEq/L (ref 135–145)
Total Bilirubin: 0.4 mg/dL (ref 0.2–1.2)
Total Protein: 7.1 g/dL (ref 6.0–8.3)

## 2018-07-21 LAB — PSA: PSA: 0.21 ng/mL (ref 0.10–4.00)

## 2018-07-21 LAB — LIPID PANEL
Cholesterol: 171 mg/dL (ref 0–200)
HDL: 27.4 mg/dL — ABNORMAL LOW (ref >39.00)
Total CHOL/HDL Ratio: 6
Triglycerides: 952 mg/dL — ABNORMAL HIGH (ref 0.0–149.0)

## 2018-07-21 LAB — LDL CHOLESTEROL, DIRECT: Direct LDL: 42 mg/dL

## 2018-07-21 NOTE — Assessment & Plan Note (Signed)
Uncontrolled with last A1C of 11 in August. Following with endocrinology, insulin pump in place.

## 2018-07-21 NOTE — Assessment & Plan Note (Signed)
Encouraged regular exercise and improvements in diet. He declines diabetic nutritionist referral.

## 2018-07-21 NOTE — Assessment & Plan Note (Signed)
Overall stable on Zoloft 200 mg and Trazodone 200 mg. Continue same. Denies SI/HI.

## 2018-07-21 NOTE — Addendum Note (Signed)
Addended by: Jacqualin Combes on: 07/21/2018 05:48 PM   Modules accepted: Orders

## 2018-07-21 NOTE — Assessment & Plan Note (Signed)
Tetanus and influenza vaccination UTD. Pneumovax date unknown, provided today.  PSA pending. Colonoscopy seems UTD, will request records for last colonoscopy. Needs to be on file. Encouraged regular exercise and a healthy diet. Offered diabetic nutritionist referral, he declines.  Exam stable. Labs pending. Follow up in 1 year for CPE.

## 2018-07-21 NOTE — Assessment & Plan Note (Signed)
Compliant to current regimen. Following with cardiology. Repeat lipids pending. No recent chest pain. Encouraged him to work on diet, start with regular exercise.

## 2018-07-21 NOTE — Assessment & Plan Note (Signed)
Compliant to CPAP, continue same. 

## 2018-07-21 NOTE — Assessment & Plan Note (Signed)
Remote pacemaker/ICD check in May 2019. Following with electrophysiology.

## 2018-07-21 NOTE — Progress Notes (Signed)
Subjective:    Patient ID: Brian Williams, male    DOB: 05-17-1961, 57 y.o.   MRN: [id number]  HPI  Brian Williams is a 57 year old male who presents today for complete physical.  Immunizations: -Tetanus: Completed in 2019 -Influenza: Completed this season -Pneumonia: Due today  Diet: He endorses a fair diet Breakfast: Toast, eggs, bagel  Lunch: Left overs, sandwich, hot dogs Dinner: Salad, pasta, chicken, hamburger, tacos, frozen pizza Snacks: Peanut butter with bread Desserts: Ice cream, 5 days weekly  Beverages: Un-sweet tea, coffee, orange juice, diet soda, water   Exercise: He is not exercising  Eye exam: Completes annually Dental exam: No recent exam Colonoscopy: Completed within 10 years.   PSA: Due Hep C Screen: Negative in 2016  BP Readings from Last 3 Encounters:  07/21/18 102/62  07/08/18 100/62  01/14/18 110/60     Review of Systems  Constitutional: Negative for unexpected weight change.  HENT: Negative for rhinorrhea.   Respiratory: Negative for cough and shortness of breath.   Cardiovascular: Negative for chest pain.  Gastrointestinal: Negative for constipation and diarrhea.  Genitourinary: Negative for difficulty urinating.  Musculoskeletal: Negative for arthralgias.  Skin: Negative for rash.  Allergic/Immunologic: Negative for environmental allergies.  Neurological: Negative for dizziness and headaches.  Psychiatric/Behavioral: Negative for suicidal ideas.       Overall feels well managed on current regimen       Past Medical History:  Diagnosis Date  . AICD (automatic cardioverter/defibrillator) present   . Anxiety    takes Xanax as needed  . Arthritis    back,knees,right shoulder  . Back pain   . Cardiomyopathy ([place])   . CHF (congestive heart failure) (HCC)    takes Lasix and Aldactone daily  . Coronary artery disease    takes Plavix daily  . Depression    takes Zoloft daily  . Diabetes mellitus without complication (HCC)    Humulin R  and Farxiga daily.Fasting blood sugar runs 140  . GERD (gastroesophageal reflux disease)   . Headache   . History of bronchitis   . History of colon polyps    benign  . History of hiatal hernia   . Hyperlipidemia    takes Fenofibrate,Crestor, and Zetia daily  . Hypertension    takes Entresto and Coreg daily  . MI (myocardial infarction) ([place]) 2001  . Obesity   . Peripheral neuropathy   . Pneumonia    history of-last time about 14 yrs ago  . PONV (postoperative nausea and vomiting)    after knee surgery 25 yrs ago b/p stayed elevated for a while  . Presence of permanent cardiac pacemaker   . Shortness of breath dyspnea    with exertion  . Sleep apnea    uses CPAP nightly  . Ventricular tachycardia ([place])    s/p RFCA PVCs 2013     Social History   Socioeconomic History  . Marital status: Married    Spouse name: Not on file  . Number of children: Not on file  . Years of education: Not on file  . Highest education level: Not on file  Occupational History  . Not on file  Social Needs  . Financial resource strain: Not on file  . Food insecurity:    Worry: Not on file    Inability: Not on file  . Transportation needs:    Medical: Not on file    Non-medical: Not on file  Tobacco Use  . Smoking status: Current Every Day Smoker  Packs/day: 0.25    Years: 25.00    Pack years: 6.25    Types: Cigarettes  . Smokeless tobacco: Never Used  Substance and Sexual Activity  . Alcohol use: No    Alcohol/week: 0.0 - 1.0 standard drinks  . Drug use: No  . Sexual activity: Not Currently  Lifestyle  . Physical activity:    Days per week: Not on file    Minutes per session: Not on file  . Stress: Not on file  Relationships  . Social connections:    Talks on phone: Not on file    Gets together: Not on file    Attends religious service: Not on file    Active member of club or organization: Not on file    Attends meetings of clubs or organizations: Not on file    Relationship  status: Not on file  . Intimate partner violence:    Fear of current or ex partner: Not on file    Emotionally abused: Not on file    Physically abused: Not on file    Forced sexual activity: Not on file  Other Topics Concern  . Not on file  Social History Narrative   Married.   Moved from [place].   Disabled.    Past Surgical History:  Procedure Laterality Date  . BACK SURGERY    . CARDIAC CATHETERIZATION    . CARDIAC CATHETERIZATION Left 05/10/2016   Procedure: Left Heart Cath and Coronary Angiography;  Surgeon: Minna Merritts, MD;  Location: [facility];  Service: Cardiovascular;  Laterality: Left;  . CARDIAC DEFIBRILLATOR PLACEMENT  10/16/2007   ICD Model number 2207-36 serial number [id number]  . CARDIAC ELECTROPHYSIOLOGY STUDY AND ABLATION    . CHOLECYSTECTOMY  2001  . COLONOSCOPY    . CORONARY ANGIOPLASTY WITH STENT PLACEMENT     7 stents  . CORONARY ARTERY BYPASS GRAFT N/A 05/24/2016   Procedure: CORONARY ARTERY BYPASS GRAFTING (CABG) x four , using left internal mammary artery and left leg greater saphenous vein harvested endoscopically;  Surgeon: Gaye Pollack, MD;  Location: [facility];  Service: Open Heart Surgery;  Laterality: N/A;  . EP IMPLANTABLE DEVICE N/A 06/16/2015   Procedure: ICD Generator Changeout;  Surgeon: Deboraha Sprang, MD;  Location: [facility];  Service: Cardiovascular;  Laterality: N/A;  . ESOPHAGOGASTRODUODENOSCOPY    . INSERT / REPLACE / REMOVE PACEMAKER    . KNEE SURGERY     bilateral knee   . [name] WITHOUT CARDIOVERSION N/A 05/24/2016   Procedure: TRANSESOPHAGEAL ECHOCARDIOGRAM ([name]);  Surgeon: Gaye Pollack, MD;  Location: [facility];  Service: Open Heart Surgery;  Laterality: N/A;  . VASECTOMY      Family History  Problem Relation Age of Onset  . Heart attack Mother 68  . Hypertension Mother   . Heart attack Father 32  . Hypertension Father   . Hypertension Maternal Uncle   . Heart disease Maternal Uncle   . Heart disease Maternal  Grandmother   . Stroke Maternal Grandmother   . Diabetes Neg Hx     Allergies  Allergen Reactions  . Morphine And Related Nausea And Vomiting  . Naproxen Other (See Comments)    Causes hyperactivity    Current Outpatient Medications on File Prior to Visit  Medication Sig Dispense Refill  . aspirin EC 81 MG tablet Take 81 mg by mouth daily.  90 tablet   . carvedilol (COREG) 12.5 MG tablet TAKE ONE TABLET BY MOUTH TWICE A  DAY WITH MEALS (Patient taking differently: Take 25 mg by mouth 2 (two) times daily with a meal. ) 60 tablet 3  . CHANTIX CONTINUING MONTH PAK 1 MG tablet TAKE ONE TABLET BY MOUTH TWICE A DAY 60 tablet 2  . clopidogrel (PLAVIX) 75 MG tablet TAKE ONE TABLET BY MOUTH DAILY 90 tablet 3  . ezetimibe (ZETIA) 10 MG tablet Take 1 tablet (10 mg total) by mouth daily. 90 tablet 3  . furosemide (LASIX) 40 MG tablet TAKE TWO TABLETS BY MOUTH EVERY MORNING AND TAKE ONE TABLET BY MOUTH EVERY EVENING 270 tablet 1  . gemfibrozil (LOPID) 600 MG tablet Take 1 tablet (600 mg total) by mouth daily. 90 tablet 1  . ibuprofen (ADVIL,MOTRIN) 200 MG tablet Take 600 mg by mouth every 6 (six) hours as needed.    . insulin regular human CONCENTRATED (HUMULIN R) 500 UNIT/ML injection For use in pump, for a total of 130 units/day (Patient taking differently: For use in pump, for a total of 170 units/day) 20 mL 11  . potassium chloride SA (K-DUR,KLOR-CON) 20 MEQ tablet Take 1 tablet (20 mEq total) by mouth daily. (Patient taking differently: Take 20 mEq by mouth 2 (two) times daily. ) 90 tablet 3  . rosuvastatin (CRESTOR) 40 MG tablet Take 1 tablet (40 mg total) by mouth daily. 90 tablet 3  . sacubitril-valsartan (ENTRESTO) 49-51 MG Take 1 tablet by mouth 2 (two) times daily. (Patient taking differently: Take 2 tablets by mouth 2 (two) times daily. ) 60 tablet 6  . sertraline (ZOLOFT) 100 MG tablet Take 2 tablets (200 mg total) by mouth daily. 180 tablet 1  . spironolactone (ALDACTONE) 25 MG tablet TAKE  1 TABLET (25 MG TOTAL) BY MOUTH DAILY. 90 tablet 3  . traZODone (DESYREL) 100 MG tablet TAKE TWO TABLETS BY MOUTH AT BEDTIME 180 tablet 0   No current facility-administered medications on file prior to visit.     BP 102/62   Pulse 73   Temp 98.4 F (36.9 C) (Oral)   Ht 6' (1.829 m)   Wt 293 lb 4 oz (133 kg)   SpO2 98%   BMI 39.77 kg/m    Objective:   Physical Exam  Constitutional: He is oriented to person, place, and time. He appears well-nourished.  HENT:  Mouth/Throat: No oropharyngeal exudate.  Eyes: Pupils are equal, round, and reactive to light. EOM are normal.  Neck: Neck supple. No thyromegaly present.  Cardiovascular: Normal rate and regular rhythm.  Respiratory: Effort normal and breath sounds normal.  GI: Soft. Bowel sounds are normal. There is no tenderness.  Musculoskeletal: Normal range of motion.  Neurological: He is alert and oriented to person, place, and time.  Skin: Skin is warm and dry.  Psychiatric: He has a normal mood and affect.           Assessment & Plan:

## 2018-07-21 NOTE — Assessment & Plan Note (Signed)
Compliant to rosuvastatin and Zetia. Continue same. Repeat lipids pending.

## 2018-07-21 NOTE — Assessment & Plan Note (Signed)
Following with cardiology, continue carvedilol, furosemide, spironolactone, Entresto.

## 2018-07-21 NOTE — Assessment & Plan Note (Signed)
Stable in the office today, continue current regimen. BMP pending today.

## 2018-07-21 NOTE — Patient Instructions (Addendum)
Stop by the lab prior to leaving today. I will notify you of your results once received.   Start exercising. You should be getting 150 minutes of moderate intensity exercise weekly.  It is important that you improve your diet. Please limit carbohydrates in the form of white bread, rice, pasta, sweets, fast food, fried food, sugary drinks, etc. Increase your consumption of fresh fruits and vegetables, whole grains, lean protein.  Please notify me if you are interested in seeing the diabetic nutritionist.   Ensure you are consuming 64 ounces of water daily.  Follow up with cardiology and endocrinology as scheduled.  We will see you in one year for your annual exam or sooner if needed.  It was a pleasure to see you today!   Diabetes Mellitus and Nutrition When you have diabetes (diabetes mellitus), it is very important to have healthy eating habits because your blood sugar (glucose) levels are greatly affected by what you eat and drink. Eating healthy foods in the appropriate amounts, at about the same times every day, can help you:  Control your blood glucose.  Lower your risk of heart disease.  Improve your blood pressure.  Reach or maintain a healthy weight.  Every person with diabetes is different, and each person has different needs for a meal plan. Your health care provider may recommend that you work with a diet and nutrition specialist (dietitian) to make a meal plan that is best for you. Your meal plan may vary depending on factors such as:  The calories you need.  The medicines you take.  Your weight.  Your blood glucose, blood pressure, and cholesterol levels.  Your activity level.  Other health conditions you have, such as heart or kidney disease.  How do carbohydrates affect me? Carbohydrates affect your blood glucose level more than any other type of food. Eating carbohydrates naturally increases the amount of glucose in your blood. Carbohydrate counting is a  method for keeping track of how many carbohydrates you eat. Counting carbohydrates is important to keep your blood glucose at a healthy level, especially if you use insulin or take certain oral diabetes medicines. It is important to know how many carbohydrates you can safely have in each meal. This is different for every person. Your dietitian can help you calculate how many carbohydrates you should have at each meal and for snack. Foods that contain carbohydrates include:  Bread, cereal, rice, pasta, and crackers.  Potatoes and corn.  Peas, beans, and lentils.  Milk and yogurt.  Fruit and juice.  Desserts, such as cakes, cookies, ice cream, and candy.  How does alcohol affect me? Alcohol can cause a sudden decrease in blood glucose (hypoglycemia), especially if you use insulin or take certain oral diabetes medicines. Hypoglycemia can be a life-threatening condition. Symptoms of hypoglycemia (sleepiness, dizziness, and confusion) are similar to symptoms of having too much alcohol. If your health care provider says that alcohol is safe for you, follow these guidelines:  Limit alcohol intake to no more than 1 drink per day for nonpregnant women and 2 drinks per day for men. One drink equals 12 oz of beer, 5 oz of wine, or 1 oz of hard liquor.  Do not drink on an empty stomach.  Keep yourself hydrated with water, diet soda, or unsweetened iced tea.  Keep in mind that regular soda, juice, and other mixers may contain a lot of sugar and must be counted as carbohydrates.  What are tips for following this plan? Reading  food labels  Start by checking the serving size on the label. The amount of calories, carbohydrates, fats, and other nutrients listed on the label are based on one serving of the food. Many foods contain more than one serving per package.  Check the total grams (g) of carbohydrates in one serving. You can calculate the number of servings of carbohydrates in one serving by  dividing the total carbohydrates by 15. For example, if a food has 30 g of total carbohydrates, it would be equal to 2 servings of carbohydrates.  Check the number of grams (g) of saturated and trans fats in one serving. Choose foods that have low or no amount of these fats.  Check the number of milligrams (mg) of sodium in one serving. Most people should limit total sodium intake to less than 2,300 mg per day.  Always check the nutrition information of foods labeled as "low-fat" or "nonfat". These foods may be higher in added sugar or refined carbohydrates and should be avoided.  Talk to your dietitian to identify your daily goals for nutrients listed on the label. Shopping  Avoid buying canned, premade, or processed foods. These foods tend to be high in fat, sodium, and added sugar.  Shop around the outside edge of the grocery store. This includes fresh fruits and vegetables, bulk grains, fresh meats, and fresh dairy. Cooking  Use low-heat cooking methods, such as baking, instead of high-heat cooking methods like deep frying.  Cook using healthy oils, such as olive, canola, or sunflower oil.  Avoid cooking with butter, cream, or high-fat meats. Meal planning  Eat meals and snacks regularly, preferably at the same times every day. Avoid going long periods of time without eating.  Eat foods high in fiber, such as fresh fruits, vegetables, beans, and whole grains. Talk to your dietitian about how many servings of carbohydrates you can eat at each meal.  Eat 4-6 ounces of lean protein each day, such as lean meat, chicken, fish, eggs, or tofu. 1 ounce is equal to 1 ounce of meat, chicken, or fish, 1 egg, or 1/4 cup of tofu.  Eat some foods each day that contain healthy fats, such as avocado, nuts, seeds, and fish. Lifestyle   Check your blood glucose regularly.  Exercise at least 30 minutes 5 or more days each week, or as told by your health care provider.  Take medicines as told by  your health care provider.  Do not use any products that contain nicotine or tobacco, such as cigarettes and e-cigarettes. If you need help quitting, ask your health care provider.  Work with a Social worker or diabetes educator to identify strategies to manage stress and any emotional and social challenges. What are some questions to ask my health care provider?  Do I need to meet with a diabetes educator?  Do I need to meet with a dietitian?  What number can I call if I have questions?  When are the best times to check my blood glucose? Where to find more information:  American Diabetes Association: diabetes.org/food-and-fitness/food  Academy of Nutrition and Dietetics: PokerClues.dk  [employer] of Diabetes and Digestive and Kidney Diseases (NIH): ContactWire.be Summary  A healthy meal plan will help you control your blood glucose and maintain a healthy lifestyle.  Working with a diet and nutrition specialist (dietitian) can help you make a meal plan that is best for you.  Keep in mind that carbohydrates and alcohol have immediate effects on your blood glucose levels. It is  important to count carbohydrates and to use alcohol carefully. This information is not intended to replace advice given to you by your health care provider. Make sure you discuss any questions you have with your health care provider. Document Released: 05/31/2005 Document Revised: 10/08/2016 Document Reviewed: 10/08/2016 Elsevier Interactive Patient Education  [employer].

## 2018-07-21 NOTE — Assessment & Plan Note (Signed)
Doing well on Trazodone 200 mg, continue same.

## 2018-07-21 NOTE — Assessment & Plan Note (Signed)
Overall doing well on Zoloft 200 mg. Using Trazodone HS. Denies SI/HI. Continue same.

## 2018-07-21 NOTE — Assessment & Plan Note (Addendum)
Following with cardiology. Continue carvedilol, furosemide, spironolactone. BMP pending.   Pacemaker/defibrilator in place. Remotely checked in May 2019.

## 2018-07-22 ENCOUNTER — Encounter: Payer: Self-pay | Admitting: Primary Care

## 2018-07-22 DIAGNOSIS — Z1211 Encounter for screening for malignant neoplasm of colon: Secondary | ICD-10-CM

## 2018-07-27 NOTE — Progress Notes (Signed)
Cardiology Office Note  Date:  07/29/2018   ID:  Brian Williams, DOB [birth year], MRN [id number]  PCP:  Pleas Koch, NP   Chief Complaint  Patient presents with  . other    Per Insurance purposes c/o edema and sob. Meds verbally with pt.    HPI:  Brian Williams is a pleasant 57 year old gentleman with history of  coronary artery disease,  long history of smoking,  diabetes type 2 poorly controlled with hemoglobin A1c of 9, followed by endocrine/Dr. Gabriel Carina, cardiac catheterization 12 dating back to 2002,  ischemic cardiomyopathy,  echocardiogram March 2016 showing ejection fraction 20-25%,   sustained VT, VT ablation in [place] at [facility] sleep apnea, on CPAP, generator change in 2016, ICD in place,  previously seen by seen by [facility] cardiology heart failure/transplant team, [name].  EF 35% in 2017 who presents for routine follow-up of his coronary artery disease, CABG, September 2017  HBA1C 11, managed by Dr. Gabriel Carina LDL 42  Weight up 280 to 300 pounds in past few years Poor diet in general with no regular exercise program "Has fluid built up" in his legs, high fluid intake Chronic lower extremity edema Previous problems with depression, helplessness  On chantix Not smoking at this time per the patient (smells like smoke) Was smoking on his last clinic visit  Followed at [facility] Carvedilol recently increased More fatigue but able to make do  EKG personally reviewed by myself on todays visit Shows normal sinus rhythm rate 75 bpm   Other past medical history reviewed  CABG By Dr. Cyndia Bent September 2017 Left internal mammary graft to the LAD SVG to diagonal, SVG to OM, SVG to PDA  Previous stress test results discussed with him in detail  showing no ischemia, old inferior wall scar He is on aspirin Plavix  EKG personally reviewed by myself on todays visit Shows normal sinus rhythm rate 75 bpm old anterior MI nonspecific ST-T wave abnormality  Other past  medical history reviewed Previous cardiac catheterization results  cath on 05/10/2016 showing a 70% eccentric distal LM stenosis, 80% proximal LAD in-stent restenosis, 80% diagonal stenosis, and tandem 90% proximal and mid RCA stenoses with mild in-stent narrowing. The LVEF is visually 35-45%  Previously seen in [place] for his cardiac issues. History dates back to 2002 when he had distal RCA disease. Several catheterizations over the next several years for in-stent restenosis of the distal RCA. Later cath, stent placed to the LAD. Most recent catheterization appears to be April 2011 showing patent LAD and RCA stent.  Despite this, he has had a decline in his ejection fraction over the past several years requiring defibrillator for ventricular tachycardia. Arrhythmia seems to be controlled on nadolol (was previously on 80 mg twice a day, decreased down to daily secondary to low blood pressure and fatigue)  only had one shock since the device was placed.   seen by [facility] cardiology heart failure/transplant team, [name]. No medication changes at that time. Creatinine was elevated with BUN, improved on subsequent lab work.   Previous cholesterol per his notes July 2015 showing total cholesterol 185, HDL 19, triglycerides 800, this was on Crestor 40, fenofibrate  PMH:   has a past medical history of AICD (automatic cardioverter/defibrillator) present, Anxiety, Arthritis, Back pain, Cardiomyopathy ([place]), CHF (congestive heart failure) ([place]), Coronary artery disease, Depression, Diabetes mellitus without complication ([place]), GERD (gastroesophageal reflux disease), Headache, History of bronchitis, History of colon polyps, History of hiatal hernia, Hyperlipidemia, Hypertension, MI (myocardial infarction) ([place]) (2001), Obesity,  Peripheral neuropathy, Pneumonia, PONV (postoperative nausea and vomiting), Presence of permanent cardiac pacemaker, Shortness of breath dyspnea, Sleep apnea, and  Ventricular tachycardia ([place]).  PSH:    Past Surgical History:  Procedure Laterality Date  . BACK SURGERY    . CARDIAC CATHETERIZATION    . CARDIAC CATHETERIZATION Left 05/10/2016   Procedure: Left Heart Cath and Coronary Angiography;  Surgeon: Minna Merritts, MD;  Location: [place];  Service: Cardiovascular;  Laterality: Left;  . CARDIAC DEFIBRILLATOR PLACEMENT  10/16/2007   ICD Model number 2207-36 serial number [id number]  . CARDIAC ELECTROPHYSIOLOGY STUDY AND ABLATION    . CHOLECYSTECTOMY  2001  . COLONOSCOPY    . CORONARY ANGIOPLASTY WITH STENT PLACEMENT     7 stents  . CORONARY ARTERY BYPASS GRAFT N/A 05/24/2016   Procedure: CORONARY ARTERY BYPASS GRAFTING (CABG) x four , using left internal mammary artery and left leg greater saphenous vein harvested endoscopically;  Surgeon: Gaye Pollack, MD;  Location: [place] OR;  Service: Open Heart Surgery;  Laterality: N/A;  . EP IMPLANTABLE DEVICE N/A 06/16/2015   Procedure: ICD Generator Changeout;  Surgeon: Deboraha Sprang, MD;  Location: [place] [facility];  Service: Cardiovascular;  Laterality: N/A;  . ESOPHAGOGASTRODUODENOSCOPY    . INSERT / REPLACE / REMOVE PACEMAKER    . KNEE SURGERY     bilateral knee   . [name] WITHOUT CARDIOVERSION N/A 05/24/2016   Procedure: TRANSESOPHAGEAL ECHOCARDIOGRAM ([name]);  Surgeon: Gaye Pollack, MD;  Location: [place];  Service: Open Heart Surgery;  Laterality: N/A;  . VASECTOMY      Current Outpatient Medications  Medication Sig Dispense Refill  . aspirin EC 81 MG tablet Take 81 mg by mouth daily.  90 tablet   . carvedilol (COREG) 25 MG tablet Take 25 mg by mouth 2 (two) times daily with a meal.    . CHANTIX CONTINUING MONTH PAK 1 MG tablet TAKE ONE TABLET BY MOUTH TWICE A DAY 60 tablet 2  . clopidogrel (PLAVIX) 75 MG tablet TAKE ONE TABLET BY MOUTH DAILY 90 tablet 3  . empagliflozin (JARDIANCE) 25 MG TABS tablet Take 25 mg by mouth daily.    Marland Kitchen ezetimibe (ZETIA) 10 MG tablet Take 1 tablet (10 mg  total) by mouth daily. 90 tablet 3  . furosemide (LASIX) 40 MG tablet TAKE TWO TABLETS BY MOUTH EVERY MORNING AND TAKE ONE TABLET BY MOUTH EVERY EVENING 270 tablet 1  . gemfibrozil (LOPID) 600 MG tablet Take 1 tablet (600 mg total) by mouth daily. 90 tablet 1  . ibuprofen (ADVIL,MOTRIN) 200 MG tablet Take 600 mg by mouth every 6 (six) hours as needed.    . insulin regular human CONCENTRATED (HUMULIN R) 500 UNIT/ML injection For use in pump, for a total of 130 units/day (Patient taking differently: For use in pump, for a total of 170 units/day) 20 mL 11  . potassium chloride SA (K-DUR,KLOR-CON) 20 MEQ tablet Take 1 tablet (20 mEq total) by mouth daily. (Patient taking differently: Take 20 mEq by mouth 2 (two) times daily. ) 90 tablet 3  . rosuvastatin (CRESTOR) 40 MG tablet Take 1 tablet (40 mg total) by mouth daily. 90 tablet 3  . sacubitril-valsartan (ENTRESTO) 97-103 MG Take 1 tablet by mouth 2 (two) times daily.    . sertraline (ZOLOFT) 100 MG tablet Take 2 tablets (200 mg total) by mouth daily. 180 tablet 1  . spironolactone (ALDACTONE) 25 MG tablet TAKE 1 TABLET (25 MG TOTAL) BY MOUTH DAILY. [place]  tablet 3  . traZODone (DESYREL) 100 MG tablet TAKE TWO TABLETS BY MOUTH AT BEDTIME 180 tablet 0   No current facility-administered medications for this visit.      Allergies:   Morphine and related and Naproxen   Social History:  The patient  reports that he quit smoking about 2 months ago. His smoking use included cigarettes. He has a 6.25 pack-year smoking history. He has never used smokeless tobacco. He reports that he does not drink alcohol or use drugs.   Family History:   family history includes Heart attack (age of onset: 53) in his mother; Heart attack (age of onset: 35) in his father; Heart disease in his maternal grandmother and maternal uncle; Hypertension in his father, maternal uncle, and mother; Stroke in his maternal grandmother.    Review of Systems: Review of Systems   Constitutional: Negative.   Respiratory: Positive for shortness of breath.   Cardiovascular: Positive for leg swelling.  Gastrointestinal: Negative.   Musculoskeletal: Negative.   Neurological: Negative.   Psychiatric/Behavioral: Negative.   All other systems reviewed and are negative.    PHYSICAL EXAM: VS:  BP 105/67 (BP Location: Left Arm, Patient Position: Sitting, Cuff Size: Large)   Pulse 75   Ht 6' (1.829 m)   Wt 298 lb 12 oz (135.5 kg)   BMI 40.52 kg/m  , BMI Body mass index is 40.52 kg/m. Constitutional:  oriented to person, place, and time. No distress.  Obese HENT:  Head: Grossly normal Eyes:  no discharge. No scleral icterus.  Neck: No JVD, no carotid bruits  Cardiovascular: Regular rate and rhythm, no murmurs appreciated Pulmonary/Chest: Clear to auscultation bilaterally, no wheezes or rails Abdominal: Soft.  no distension.  no tenderness.  Musculoskeletal: Normal range of motion Neurological:  normal muscle tone. Coordination normal. No atrophy Skin: Skin warm and dry Psychiatric: normal affect, pleasant   Recent Labs: 07/21/2018: ALT 27; BUN 18; Creatinine, Ser 0.99; Potassium 4.1; Sodium 140    Lipid Panel Lab Results  Component Value Date   CHOL 171 07/21/2018   HDL 27.40 (L) 07/21/2018   TRIG (H) 07/21/2018    952.0 Triglyceride is over 400; calculations on Lipids are invalid.      Wt Readings from Last 3 Encounters:  07/29/18 298 lb 12 oz (135.5 kg)  07/21/18 293 lb 4 oz (133 kg)  07/08/18 291 lb 6.4 oz (132.2 kg)       ASSESSMENT AND PLAN:  Congestive dilated cardiomyopathy ([place]) - Plan: EKG 12-Lead  continue carvedilol, Lasix, Entresto, Aldactone Having some fatigue on higher dose carvedilol but feels it is manageable On Lasix 80 in the morning 40 in the p.m. Recommended extra 44 weight gain, when legs more edematous  Essential hypertension - Plan: EKG 12-Lead Low blood pressure,  Some fatigue.  No changes made  Chronic systolic  heart failure ([place]) - Plan: EKG 12-Lead Chronic shortness of breath, unchanged Extra Lasix as needed Close monitoring of weight, stressed importance of weight loss  Stable angina ([facility]) Chronic chest symptoms, with shortness of breath symptoms Did not complete cardiac rehabilitation secondary to insurance issues Despite our urging has not started regular exercise program and weight loss  Morbid obesity with BMI of 40.0-44.9, adult ([place]) Continues to be an issue, prior history of depression, poor diet Up 20 pounds in the past several years Stressed importance of exercise  Hx of CABG Chronic stable anginal symptoms Stable chest pain or shortness of breath  OSA on CPAP Recommended compliance with  his CPAP  Pure hypercholesterolemia On Crestor and Zetia LDL 42  Active smoker Smells like smoke  Per his report, he is not smoking and using Chantix   Total encounter time more than 25 minutes  Greater than 50% was spent in counseling and coordination of care with the patient  Disposition:   F/U  12 months Has follow-up with numerous physicians   No orders of the defined types were placed in this encounter.    Signed, Esmond Plants, M.D., Ph.D. 07/29/2018  [place], [place]

## 2018-07-29 ENCOUNTER — Encounter: Payer: Self-pay | Admitting: Cardiovascular Disease

## 2018-07-29 ENCOUNTER — Ambulatory Visit (INDEPENDENT_AMBULATORY_CARE_PROVIDER_SITE_OTHER): Payer: BLUE CROSS/BLUE SHIELD | Admitting: Cardiovascular Disease

## 2018-07-29 VITALS — BP 105/67 | HR 75 | Ht 72.0 in | Wt 298.8 lb

## 2018-07-29 DIAGNOSIS — Z95810 Presence of automatic (implantable) cardiac defibrillator: Secondary | ICD-10-CM

## 2018-07-29 DIAGNOSIS — E118 Type 2 diabetes mellitus with unspecified complications: Secondary | ICD-10-CM | POA: Diagnosis not present

## 2018-07-29 DIAGNOSIS — E1142 Type 2 diabetes mellitus with diabetic polyneuropathy: Secondary | ICD-10-CM

## 2018-07-29 DIAGNOSIS — I5022 Chronic systolic (congestive) heart failure: Secondary | ICD-10-CM

## 2018-07-29 DIAGNOSIS — I420 Dilated cardiomyopathy: Secondary | ICD-10-CM | POA: Diagnosis not present

## 2018-07-29 DIAGNOSIS — I25118 Atherosclerotic heart disease of native coronary artery with other forms of angina pectoris: Secondary | ICD-10-CM | POA: Diagnosis not present

## 2018-07-29 DIAGNOSIS — IMO0002 Reserved for concepts with insufficient information to code with codable children: Secondary | ICD-10-CM

## 2018-07-29 DIAGNOSIS — G4733 Obstructive sleep apnea (adult) (pediatric): Secondary | ICD-10-CM

## 2018-07-29 DIAGNOSIS — Z9989 Dependence on other enabling machines and devices: Secondary | ICD-10-CM

## 2018-07-29 DIAGNOSIS — E1165 Type 2 diabetes mellitus with hyperglycemia: Secondary | ICD-10-CM

## 2018-07-29 MED ORDER — FUROSEMIDE 40 MG PO TABS: ORAL_TABLET | ORAL | 3 refills | Status: DC

## 2018-07-29 NOTE — Patient Instructions (Addendum)
Medication Instructions:   On coreg 25 BID, entresto high dose,   If you need a refill on your cardiac medications before your next appointment, please call your pharmacy.    Lab work: No new labs needed   If you have labs (blood work) drawn today and your tests are completely normal, you will receive your results only by: Marland Kitchen MyChart Message (if you have MyChart) OR . A paper copy in the mail If you have any lab test that is abnormal or we need to change your treatment, we will call you to review the results.   Testing/Procedures: No new testing needed   Follow-Up: At [facility], you and your health needs are our priority.  As part of our continuing mission to provide you with exceptional heart care, we have created designated Provider Care Teams.  These Care Teams include your primary Cardiologist (physician) and Advanced Practice Providers (APPs -  Physician Assistants and Nurse Practitioners) who all work together to provide you with the care you need, when you need it.  . You will need a follow up appointment in 12 months .   Please call our office 2 months in advance to schedule this appointment.    . Providers on your designated Care Team:   . Murray Hodgkins, NP . Christell Faith, PA-C . Marrianne Mood, PA-C  Any Other Special Instructions Will Be Listed Below (If Applicable).  For educational health videos Log in to : [email or web address] Or : [email or web address], password : [email or web address]

## 2018-07-30 NOTE — Addendum Note (Signed)
Addended by: Britt Bottom on: 07/30/2018 12:48 PM   Modules accepted: Orders

## 2018-07-31 ENCOUNTER — Other Ambulatory Visit: Payer: Self-pay

## 2018-07-31 DIAGNOSIS — Z86010 Personal history of colonic polyps: Secondary | ICD-10-CM

## 2018-08-05 ENCOUNTER — Telehealth: Payer: Self-pay | Admitting: Cardiovascular Disease

## 2018-08-05 ENCOUNTER — Telehealth: Payer: Self-pay | Admitting: Internal Medicine

## 2018-08-05 NOTE — Telephone Encounter (Signed)
Received records request [employer], forwarded to [facility] for processing.

## 2018-08-05 NOTE — Telephone Encounter (Signed)
° °  [facility] Pre-operative Risk Assessment    Request for surgical clearance:  1. What type of surgery is being performed? Colonoscopy   2. When is this surgery scheduled? 08/22/18    3. What type of clearance is required (medical clearance vs. Pharmacy clearance to hold med vs. Both)? Both   4. Are there any medications that need to be held prior to surgery and how long? Plavix   5. Practice name and name of physician performing surgery? [facility] Dr Jonathon Bellows   6. What is your office phone number [phone]    7.   What is your office fax number [phone]   8.   Anesthesia type (None, local, MAC, general) ?

## 2018-08-06 NOTE — Telephone Encounter (Signed)
Last seen 07/29/18 by Dr Rockey Situ.  Routing to him.

## 2018-08-08 ENCOUNTER — Other Ambulatory Visit: Payer: Self-pay | Admitting: Cardiovascular Disease

## 2018-08-08 NOTE — Telephone Encounter (Signed)
LMOV for patient to call back and verify which dose of Entresto he is taking.

## 2018-08-09 NOTE — Telephone Encounter (Signed)
Moderate but acceptable risk to hold plavix 5 days prior to colonoscopy Restart plavix  when cleared by GI  Stay on asa 81 daily

## 2018-08-11 NOTE — Telephone Encounter (Signed)
Routed to number provided via EPIC fax.  

## 2018-08-11 NOTE — Telephone Encounter (Signed)
-----   Message -----  From: Worthy Keeler  Sent: 08/11/2018  8:27 AM EST  To: [facility] Triage  Subject: Visit Follow-Up Question               Good Day,   I received an vmx asking to verifying my dose if Entresto    sacubitril-valsartan 97-103 mg tablet  Commonly known as: ENTRESTO  About This Medication    Instructions: Take 1 tablet by mouth two (2) times a day.    If you have any questions or concerns,please feel free to contact me     Best Regards,  Worthy Keeler   I will refuse this one due to dose change

## 2018-08-12 ENCOUNTER — Encounter: Payer: Self-pay | Admitting: Internal Medicine

## 2018-08-12 ENCOUNTER — Ambulatory Visit (INDEPENDENT_AMBULATORY_CARE_PROVIDER_SITE_OTHER): Payer: BLUE CROSS/BLUE SHIELD | Admitting: Internal Medicine

## 2018-08-12 VITALS — BP 124/76 | HR 70 | Ht 72.0 in | Wt 298.5 lb

## 2018-08-12 DIAGNOSIS — R9431 Abnormal electrocardiogram [ECG] [EKG]: Secondary | ICD-10-CM

## 2018-08-12 DIAGNOSIS — I5022 Chronic systolic (congestive) heart failure: Secondary | ICD-10-CM

## 2018-08-12 LAB — CUP PACEART INCLINIC DEVICE CHECK
Battery Remaining Longevity: 72 mo
Brady Statistic RA Percent Paced: 0.13 %
Brady Statistic RV Percent Paced: 0.48 %
Date Time Interrogation Session: 20191126162409
HighPow Impedance: 40.0131
Implantable Lead Implant Date: 20160929
Implantable Lead Implant Date: 20160929
Implantable Lead Location: 753859
Implantable Lead Location: 753860
Implantable Lead Model: 7071
Implantable Pulse Generator Implant Date: 20160929
Lead Channel Impedance Value: 350 Ohm
Lead Channel Impedance Value: 400 Ohm
Lead Channel Pacing Threshold Amplitude: 0.75 V
Lead Channel Pacing Threshold Amplitude: 0.75 V
Lead Channel Pacing Threshold Pulse Width: 0.4 ms
Lead Channel Pacing Threshold Pulse Width: 0.4 ms
Lead Channel Sensing Intrinsic Amplitude: 10.2 mV
Lead Channel Sensing Intrinsic Amplitude: 3.3 mV
Lead Channel Setting Pacing Amplitude: 2 V
Lead Channel Setting Pacing Amplitude: 2.5 V
Lead Channel Setting Pacing Pulse Width: 0.4 ms
Lead Channel Setting Sensing Sensitivity: 0.5 mV
Pulse Gen Serial Number: 7306375

## 2018-08-12 NOTE — Progress Notes (Signed)
Patient Care Team: Pleas Koch, NP as PCP - General (Internal Medicine) Minna Merritts, MD as Consulting Physician (Cardiology) Radene Knee Ladell Pier, MD as Consulting Physician (Cardiology)   HPI  Brian Williams is a 57 y.o. male Seen in follow-up for ICD implanted for secondary prevention after holter demonstrated VT.  Also with PVCs, both for which he has undergone ablation 2013 @ [place]  . His device reached ERI and underwent generator replacement 9/16. He has an underlying history of ischemic heart disease.  He has had multiple caths and PCI  Followed previously at [facility] and then at [place] with amio  Subsequently discontinued    He underwent catheterization and bypass X 4 9/17 by Dr. Buckner Malta  Chronic shortness of breath.  Edema intermittent which he treats with extra furosemide, now at baseline.  Has STOPPED SMOKING and with this he has been eating more   He has treated his sleep apnea   DATE TEST    2015 Myoview   EF 35 %   9/17 Echo([facility]) EF 35%   9/17 Cath   Severe proximal LAD disease (ISR) Severe proximal and mid RAC disease Severe OM2 disease  Severe proximal diagonal #1 disease Moderate to severe left main disease  11/17 Myoview   EF 28 % No Ischemia        Date Cr K Mg Hgb  11/17  0./86 4.1 2.5 8.7>>12.2  9/18  0.8 3.7     11/19 0.99 4.1              Device History: ICD implanted  2009 generator replacement 2016 History of appropriate therapy: No History of AAD therapy: Yes    Past Medical History:  Diagnosis Date  . AICD (automatic cardioverter/defibrillator) present   . Anxiety    takes Xanax as needed  . Arthritis    back,knees,right shoulder  . Back pain   . Cardiomyopathy ([place])   . CHF (congestive heart failure) (HCC)    takes Lasix and Aldactone daily  . Coronary artery disease    takes Plavix daily  . Depression    takes Zoloft daily  . Diabetes mellitus without complication (HCC)    Humulin R and Farxiga daily.Fasting  blood sugar runs 140  . GERD (gastroesophageal reflux disease)   . Headache   . History of bronchitis   . History of colon polyps    benign  . History of hiatal hernia   . Hyperlipidemia    takes Fenofibrate,Crestor, and Zetia daily  . Hypertension    takes Entresto and Coreg daily  . MI (myocardial infarction) ([place]) 2001  . Obesity   . Peripheral neuropathy   . Pneumonia    history of-last time about 14 yrs ago  . PONV (postoperative nausea and vomiting)    after knee surgery 25 yrs ago b/p stayed elevated for a while  . Presence of permanent cardiac pacemaker   . Shortness of breath dyspnea    with exertion  . Sleep apnea    uses CPAP nightly  . Ventricular tachycardia ([place])    s/p RFCA PVCs 2013    Past Surgical History:  Procedure Laterality Date  . BACK SURGERY    . CARDIAC CATHETERIZATION    . CARDIAC CATHETERIZATION Left 05/10/2016   Procedure: Left Heart Cath and Coronary Angiography;  Surgeon: Minna Merritts, MD;  Location: [facility];  Service: Cardiovascular;  Laterality: Left;  . CARDIAC DEFIBRILLATOR  PLACEMENT  10/16/2007   ICD Model number 5188-41 serial number [id number]  . CARDIAC ELECTROPHYSIOLOGY STUDY AND ABLATION    . CHOLECYSTECTOMY  2001  . COLONOSCOPY    . CORONARY ANGIOPLASTY WITH STENT PLACEMENT     7 stents  . CORONARY ARTERY BYPASS GRAFT N/A 05/24/2016   Procedure: CORONARY ARTERY BYPASS GRAFTING (CABG) x four , using left internal mammary artery and left leg greater saphenous vein harvested endoscopically;  Surgeon: Gaye Pollack, MD;  Location: [facility];  Service: Open Heart Surgery;  Laterality: N/A;  . EP IMPLANTABLE DEVICE N/A 06/16/2015   Procedure: ICD Generator Changeout;  Surgeon: Deboraha Sprang, MD;  Location: [facility];  Service: Cardiovascular;  Laterality: N/A;  . ESOPHAGOGASTRODUODENOSCOPY    . INSERT / REPLACE / REMOVE PACEMAKER    . KNEE SURGERY     bilateral knee   . [name] WITHOUT CARDIOVERSION N/A 05/24/2016    Procedure: TRANSESOPHAGEAL ECHOCARDIOGRAM ([name]);  Surgeon: Gaye Pollack, MD;  Location: [facility];  Service: Open Heart Surgery;  Laterality: N/A;  . VASECTOMY      Current Outpatient Medications  Medication Sig Dispense Refill  . aspirin EC 81 MG tablet Take 81 mg by mouth daily.  90 tablet   . carvedilol (COREG) 25 MG tablet Take 25 mg by mouth 2 (two) times daily with a meal.    . CHANTIX CONTINUING MONTH PAK 1 MG tablet TAKE ONE TABLET BY MOUTH TWICE A DAY 60 tablet 2  . clopidogrel (PLAVIX) 75 MG tablet TAKE ONE TABLET BY MOUTH DAILY 90 tablet 3  . empagliflozin (JARDIANCE) 25 MG TABS tablet Take 25 mg by mouth daily.    Marland Kitchen ezetimibe (ZETIA) 10 MG tablet Take 1 tablet (10 mg total) by mouth daily. 90 tablet 3  . furosemide (LASIX) 40 MG tablet TAKE TWO TABLETS BY MOUTH EVERY MORNING AND TAKE ONE TABLET BY MOUTH EVERY EVENING, Take extra in the afternoon as needed 360 tablet 3  . gemfibrozil (LOPID) 600 MG tablet Take 1 tablet (600 mg total) by mouth daily. 90 tablet 1  . ibuprofen (ADVIL,MOTRIN) 200 MG tablet Take 600 mg by mouth every 6 (six) hours as needed.    . insulin regular human CONCENTRATED (HUMULIN R) 500 UNIT/ML injection For use in pump, for a total of 130 units/day (Patient taking differently: For use in pump, for a total of 170 units/day) 20 mL 11  . potassium chloride SA (K-DUR,KLOR-CON) 20 MEQ tablet Take 20 mEq by mouth 2 (two) times daily.    . rosuvastatin (CRESTOR) 40 MG tablet Take 1 tablet (40 mg total) by mouth daily. 90 tablet 3  . sacubitril-valsartan (ENTRESTO) 97-103 MG Take 1 tablet by mouth 2 (two) times daily.    . sertraline (ZOLOFT) 100 MG tablet Take 2 tablets (200 mg total) by mouth daily. 180 tablet 1  . spironolactone (ALDACTONE) 25 MG tablet TAKE 1 TABLET (25 MG TOTAL) BY MOUTH DAILY. 90 tablet 3  . traZODone (DESYREL) 100 MG tablet TAKE TWO TABLETS BY MOUTH AT BEDTIME 180 tablet 0   No current facility-administered medications for this visit.      Allergies  Allergen Reactions  . Morphine And Related Nausea And Vomiting  . Naproxen Other (See Comments)    Causes hyperactivity      Review of Systems negative except from HPI and PMH  Physical Exam BP 124/76 (BP Location: Left Arm, Patient Position: Sitting, Cuff Size: Large)   Pulse 70   Ht  6' (1.829 m)   Wt 298 lb 8 oz (135.4 kg)   BMI 40.48 kg/m  Well developed and nourished in no acute distress HENT normal Neck supple  Device pocket well healed; without hematoma or erythema.  There is no tethering  Clear Regular rate and rhythm, no murmurs or gallops Abd-soft with active BS No Clubbing cyanosis tr edema Skin-warm and dry A & Oriented  Grossly normal sensory and motor function   ECG sinus @ 70  22/12/42 Axis 93 Nonspecific STchanges  Assessment and  Plan  Ischemic cardiomyopathy  Implantable defibrillator-[employer]    History of inappropriate therapy  High Risk Medication Surveillance  Tobacco use>>stopped!!!  Congestive heart failure-chronic-systolic  PVCs       He has been able to keep himself euvolemic over the recent months by using extra lasix  PVC burden remains low at 1.4 %  Without symptoms of ischemia  He has stopped smoking and will work on curbing appetite

## 2018-08-12 NOTE — Patient Instructions (Signed)
Medication Instructions:  - Your physician recommends that you continue on your current medications as directed. Please refer to the Current Medication list given to you today.  If you need a refill on your cardiac medications before your next appointment, please call your pharmacy.   Lab work: - none ordered  If you have labs (blood work) drawn today and your tests are completely normal, you will receive your results only by: Marland Kitchen MyChart Message (if you have MyChart) OR . A paper copy in the mail If you have any lab test that is abnormal or we need to change your treatment, we will call you to review the results.  Testing/Procedures: - none ordered  Follow-Up: At [facility], you and your health needs are our priority.  As part of our continuing mission to provide you with exceptional heart care, we have created designated Provider Care Teams.  These Care Teams include your primary Cardiologist (physician) and Advanced Practice Providers (APPs -  Physician Assistants and Nurse Practitioners) who all work together to provide you with the care you need, when you need it. You will need a follow up appointment in 1 year with Dr. Caryl Comes.  Please call our office 2 months in advance to schedule this appointment.    Remote monitoring is used to monitor your Pacemaker of ICD from home. This monitoring reduces the number of office visits required to check your device to one time per year. It allows [place] to keep an eye on the functioning of your device to ensure it is working properly. You are scheduled for a device check from home on 02/09/19. You may send your transmission at any time that day. If you have a wireless device, the transmission will be sent automatically. After your physician reviews your transmission, you will receive a postcard with your next transmission date.      Any Other Special Instructions Will Be Listed Below (If Applicable). - N/A

## 2018-08-19 ENCOUNTER — Telehealth: Payer: Self-pay

## 2018-08-19 NOTE — Telephone Encounter (Signed)
Patient returned call regarding colonoscopy blood thinner advice.  Patient has been informed that Dr. Rockey Situ has advised him to stop blood thinner 5 days prior to colonoscopy.  I did not review the blood thinner advice until today, and apologized to patient for not getting in touch with him sooner.  Explained to him that I needed to reschedule his colonoscopy to allow for 5 days off Plavix, and continue aspirin 81mg .  Patient understood, and was okay with rescheduling colonoscopy to  12/09.  Trish in Endoscopy has been informed to change procedure date to 12/09.  Referral will be updated to reflect date change.  Thank you, Sharyn Lull

## 2018-08-21 ENCOUNTER — Other Ambulatory Visit: Payer: Self-pay

## 2018-08-21 MED ORDER — PEG 3350-KCL-NABCB-NACL-NASULF 236 G PO SOLR: 4000.0000 mL | Freq: Once | ORAL | 0 refills | Status: DC

## 2018-08-21 MED ORDER — PEG 3350-KCL-NA BICARB-NACL 420 G PO SOLR: 4000.0000 mL | Freq: Once | ORAL | 0 refills | Status: AC

## 2018-08-25 ENCOUNTER — Encounter: Payer: Self-pay | Admitting: Certified Registered"

## 2018-08-25 ENCOUNTER — Ambulatory Visit
Admission: RE | Admit: 2018-08-25 | Discharge: 2018-08-25 | Disposition: A | Discharge status: Home / Self Care | Payer: BLUE CROSS/BLUE SHIELD | Source: Ambulatory Visit | Attending: Gastroenterology | Admitting: Gastroenterology

## 2018-08-25 ENCOUNTER — Encounter
Admission: RE | Disposition: A | Discharge status: Home / Self Care | Payer: Self-pay | Source: Ambulatory Visit | Attending: Gastroenterology

## 2018-08-25 DIAGNOSIS — Z86010 Personal history of colonic polyps: Secondary | ICD-10-CM

## 2018-08-25 SURGERY — COLONOSCOPY WITH PROPOFOL
Anesthesia: General

## 2018-08-25 MED ORDER — SODIUM CHLORIDE 0.9 % IV SOLN: INTRAVENOUS | Status: DC

## 2018-08-25 NOTE — Progress Notes (Signed)
Not done due to brown liquid after the prep

## 2018-08-28 ENCOUNTER — Other Ambulatory Visit: Payer: Self-pay

## 2018-08-28 ENCOUNTER — Encounter: Payer: Self-pay | Admitting: Student

## 2018-08-28 ENCOUNTER — Ambulatory Visit: Payer: BLUE CROSS/BLUE SHIELD | Admitting: Anesthesiology

## 2018-08-28 ENCOUNTER — Ambulatory Visit
Admission: RE | Admit: 2018-08-28 | Discharge: 2018-08-28 | Disposition: A | Discharge status: Home / Self Care | Payer: BLUE CROSS/BLUE SHIELD | Attending: Gastroenterology | Admitting: Gastroenterology

## 2018-08-28 ENCOUNTER — Encounter
Admission: RE | Disposition: A | Discharge status: Home / Self Care | Payer: Self-pay | Source: Home / Self Care | Attending: Gastroenterology

## 2018-08-28 DIAGNOSIS — G4730 Sleep apnea, unspecified: Secondary | ICD-10-CM | POA: Diagnosis not present

## 2018-08-28 DIAGNOSIS — K635 Polyp of colon: Secondary | ICD-10-CM | POA: Insufficient documentation

## 2018-08-28 DIAGNOSIS — Z1211 Encounter for screening for malignant neoplasm of colon: Secondary | ICD-10-CM | POA: Insufficient documentation

## 2018-08-28 DIAGNOSIS — E1140 Type 2 diabetes mellitus with diabetic neuropathy, unspecified: Secondary | ICD-10-CM | POA: Insufficient documentation

## 2018-08-28 DIAGNOSIS — I2510 Atherosclerotic heart disease of native coronary artery without angina pectoris: Secondary | ICD-10-CM | POA: Diagnosis not present

## 2018-08-28 DIAGNOSIS — D128 Benign neoplasm of rectum: Secondary | ICD-10-CM | POA: Insufficient documentation

## 2018-08-28 DIAGNOSIS — F419 Anxiety disorder, unspecified: Secondary | ICD-10-CM | POA: Diagnosis not present

## 2018-08-28 DIAGNOSIS — K449 Diaphragmatic hernia without obstruction or gangrene: Secondary | ICD-10-CM | POA: Diagnosis not present

## 2018-08-28 DIAGNOSIS — I509 Heart failure, unspecified: Secondary | ICD-10-CM | POA: Diagnosis not present

## 2018-08-28 DIAGNOSIS — I110 Hypertensive heart disease with heart failure: Secondary | ICD-10-CM | POA: Insufficient documentation

## 2018-08-28 DIAGNOSIS — Z951 Presence of aortocoronary bypass graft: Secondary | ICD-10-CM | POA: Diagnosis not present

## 2018-08-28 DIAGNOSIS — Z7982 Long term (current) use of aspirin: Secondary | ICD-10-CM | POA: Diagnosis not present

## 2018-08-28 DIAGNOSIS — Z86010 Personal history of colonic polyps: Secondary | ICD-10-CM | POA: Insufficient documentation

## 2018-08-28 DIAGNOSIS — Z95810 Presence of automatic (implantable) cardiac defibrillator: Secondary | ICD-10-CM | POA: Insufficient documentation

## 2018-08-28 DIAGNOSIS — K621 Rectal polyp: Secondary | ICD-10-CM | POA: Diagnosis not present

## 2018-08-28 DIAGNOSIS — E785 Hyperlipidemia, unspecified: Secondary | ICD-10-CM | POA: Insufficient documentation

## 2018-08-28 DIAGNOSIS — F329 Major depressive disorder, single episode, unspecified: Secondary | ICD-10-CM | POA: Insufficient documentation

## 2018-08-28 DIAGNOSIS — Z79899 Other long term (current) drug therapy: Secondary | ICD-10-CM | POA: Insufficient documentation

## 2018-08-28 DIAGNOSIS — Z7902 Long term (current) use of antithrombotics/antiplatelets: Secondary | ICD-10-CM | POA: Insufficient documentation

## 2018-08-28 DIAGNOSIS — Z955 Presence of coronary angioplasty implant and graft: Secondary | ICD-10-CM | POA: Diagnosis not present

## 2018-08-28 DIAGNOSIS — Z6839 Body mass index (BMI) 39.0-39.9, adult: Secondary | ICD-10-CM | POA: Insufficient documentation

## 2018-08-28 DIAGNOSIS — Z87891 Personal history of nicotine dependence: Secondary | ICD-10-CM | POA: Insufficient documentation

## 2018-08-28 DIAGNOSIS — I252 Old myocardial infarction: Secondary | ICD-10-CM | POA: Diagnosis not present

## 2018-08-28 DIAGNOSIS — D123 Benign neoplasm of transverse colon: Secondary | ICD-10-CM | POA: Diagnosis not present

## 2018-08-28 DIAGNOSIS — Z9641 Presence of insulin pump (external) (internal): Secondary | ICD-10-CM | POA: Insufficient documentation

## 2018-08-28 DIAGNOSIS — I429 Cardiomyopathy, unspecified: Secondary | ICD-10-CM | POA: Insufficient documentation

## 2018-08-28 DIAGNOSIS — Z794 Long term (current) use of insulin: Secondary | ICD-10-CM | POA: Insufficient documentation

## 2018-08-28 DIAGNOSIS — E669 Obesity, unspecified: Secondary | ICD-10-CM | POA: Insufficient documentation

## 2018-08-28 DIAGNOSIS — M19011 Primary osteoarthritis, right shoulder: Secondary | ICD-10-CM | POA: Insufficient documentation

## 2018-08-28 DIAGNOSIS — D122 Benign neoplasm of ascending colon: Secondary | ICD-10-CM | POA: Diagnosis not present

## 2018-08-28 DIAGNOSIS — M479 Spondylosis, unspecified: Secondary | ICD-10-CM | POA: Insufficient documentation

## 2018-08-28 DIAGNOSIS — M170 Bilateral primary osteoarthritis of knee: Secondary | ICD-10-CM | POA: Insufficient documentation

## 2018-08-28 HISTORY — PX: COLONOSCOPY WITH PROPOFOL: SHX5780

## 2018-08-28 LAB — GLUCOSE, CAPILLARY: Glucose-Capillary: 279 mg/dL — ABNORMAL HIGH (ref 70–99)

## 2018-08-28 SURGERY — COLONOSCOPY WITH PROPOFOL
Anesthesia: General

## 2018-08-28 MED ORDER — FENTANYL CITRATE (PF) 100 MCG/2ML IJ SOLN
INTRAMUSCULAR | Status: DC | PRN
  Administered 2018-08-28 (×4): 25 ug via INTRAVENOUS

## 2018-08-28 MED ORDER — MIDAZOLAM HCL 2 MG/2ML IJ SOLN
INTRAMUSCULAR | Status: DC | PRN
  Administered 2018-08-28 (×2): 1 mg via INTRAVENOUS

## 2018-08-28 MED ORDER — ONDANSETRON HCL 4 MG/2ML IJ SOLN
INTRAMUSCULAR | Status: DC | PRN
  Administered 2018-08-28: 4 mg via INTRAVENOUS

## 2018-08-28 MED ORDER — PROPOFOL 500 MG/50ML IV EMUL
INTRAVENOUS | Status: DC | PRN
  Administered 2018-08-28: 75 ug/kg/min via INTRAVENOUS

## 2018-08-28 MED ORDER — PHENYLEPHRINE HCL 10 MG/ML IJ SOLN
INTRAMUSCULAR | Status: DC | PRN
  Administered 2018-08-28: 50 ug via INTRAVENOUS
  Administered 2018-08-28 (×2): 100 ug via INTRAVENOUS
  Administered 2018-08-28: 50 ug via INTRAVENOUS
  Administered 2018-08-28 (×4): 100 ug via INTRAVENOUS

## 2018-08-28 MED ORDER — PROPOFOL 10 MG/ML IV BOLUS
INTRAVENOUS | Status: DC | PRN
  Administered 2018-08-28 (×2): 50 mg via INTRAVENOUS

## 2018-08-28 MED ORDER — SODIUM CHLORIDE 0.9 % IV SOLN
INTRAVENOUS | Status: DC
  Administered 2018-08-28: 09:00:00 via INTRAVENOUS

## 2018-08-28 MED ORDER — MIDAZOLAM HCL 2 MG/2ML IJ SOLN
INTRAMUSCULAR | Status: AC
  Filled 2018-08-28: qty 2

## 2018-08-28 MED ORDER — FENTANYL CITRATE (PF) 100 MCG/2ML IJ SOLN
INTRAMUSCULAR | Status: AC
  Filled 2018-08-28: qty 2

## 2018-08-28 NOTE — Anesthesia Post-op Follow-up Note (Signed)
Anesthesia QCDR form completed.        

## 2018-08-28 NOTE — Anesthesia Postprocedure Evaluation (Signed)
Anesthesia Post Note  Patient: Brian Williams  Procedure(s) Performed: COLONOSCOPY WITH PROPOFOL (N/A )  Patient location during evaluation: Endoscopy Anesthesia Type: General Level of consciousness: awake and alert and oriented Pain management: pain level controlled Vital Signs Assessment: post-procedure vital signs reviewed and stable Respiratory status: spontaneous breathing, nonlabored ventilation and respiratory function stable Cardiovascular status: blood pressure returned to baseline and stable Postop Assessment: no signs of nausea or vomiting Anesthetic complications: no     Last Vitals:  Vitals:   08/28/18 0933 08/28/18 0943  BP: (!) 93/53 109/69  Pulse:    Resp: 20 18  Temp:    SpO2:      Last Pain:  Vitals:   08/28/18 0943  TempSrc:   PainSc: 0-No pain                 Elizabelle Fite

## 2018-08-28 NOTE — Op Note (Signed)
[facility] Patient Name: Brian Williams Procedure Date: 08/28/2018 8:24 AM MRN: [id number] Account #: [id number] Date of Birth: 1960/10/31 Admit Type: Outpatient Age: 57 Room: [facility] ENDO ROOM 4 Gender: Male Note Status: Finalized Procedure:            Colonoscopy Indications:          High risk colon cancer surveillance: Personal history                        of colonic polyps Providers:            Jonathon Bellows MD, MD Referring MD:         Pleas Koch (Referring MD) Medicines:            Monitored Anesthesia Care Complications:        No immediate complications. Procedure:            Pre-Anesthesia Assessment:                       - Prior to the procedure, a History and Physical was                        performed, and patient medications, allergies and                        sensitivities were reviewed. The patient's tolerance of                        previous anesthesia was reviewed.                       - The risks and benefits of the procedure and the                        sedation options and risks were discussed with the                        patient. All questions were answered and informed                        consent was obtained.                       - ASA Grade Assessment: III - A patient with severe                        systemic disease.                       After obtaining informed consent, the colonoscope was                        passed under direct vision. Throughout the procedure,                        the patient's blood pressure, pulse, and oxygen                        saturations were monitored continuously. The                        Colonoscope  was introduced through the anus and                        advanced to the the cecum, identified by appendiceal                        orifice and ileocecal valve. The colonoscopy was                        performed with ease. The patient tolerated the               procedure well. The quality of the bowel preparation                        was good. Findings:      The perianal and digital rectal examinations were normal.      Multiple sessile polyps were found in the rectum. The polyps were 5 to 8       mm in size. These polyps were removed with a hot snare. Resection and       retrieval were complete.      A 5 mm polyp was found in the rectum. The polyp was sessile. The polyp       was removed with a cold snare. Resection and retrieval were complete.      Three sessile polyps were found in the ascending colon. The polyps were       6 to 9 mm in size. These polyps were removed with a cold snare.       Resection and retrieval were complete.      A 8 mm polyp was found in the ascending colon. The polyp was sessile.       The polyp was removed with a hot snare. Resection and retrieval were       complete.      Two sessile polyps were found in the sigmoid colon. The polyps were 4 to       6 mm in size. These polyps were removed with a cold snare. Resection and       retrieval were complete.      A 3 mm polyp was found in the sigmoid colon. The polyp was sessile. The       polyp was removed with a cold biopsy forceps. Resection and retrieval       were complete.      The exam was otherwise without abnormality. Impression:           - Multiple 5 to 8 mm polyps in the rectum, removed with                        a hot snare. Resected and retrieved.                       - One 5 mm polyp in the rectum, removed with a cold                        snare. Resected and retrieved.                       - Three 6 to 9 mm polyps in the ascending colon,  removed with a cold snare. Resected and retrieved.                       - One 8 mm polyp in the ascending colon, removed with a                        hot snare. Resected and retrieved.                       - Two 4 to 6 mm polyps in the sigmoid colon, removed                         with a cold snare. Resected and retrieved.                       - One 3 mm polyp in the sigmoid colon, removed with a                        cold biopsy forceps. Resected and retrieved.                       - The examination was otherwise normal. Recommendation:       - Discharge patient to home (with escort).                       - Resume previous diet.                       - Continue present medications.                       - Await pathology results.                       - Repeat colonoscopy in 3 years for surveillance. Procedure Code(s):    --- Professional ---                       [phone], Colonoscopy, flexible; with removal of tumor(s),                        polyp(s), or other lesion(s) by snare technique                       [id number], 26, Colonoscopy, flexible; with biopsy, single                        or multiple Diagnosis Code(s):    --- Professional ---                       K62.1, Rectal polyp                       D12.2, Benign neoplasm of ascending colon                       D12.5, Benign neoplasm of sigmoid colon                       Z86.010, Personal history of colonic polyps CPT copyright 2018 American Medical Association. All rights reserved. The codes documented in this  report are preliminary and upon coder review may  be revised to meet current compliance requirements. Jonathon Bellows, MD Jonathon Bellows MD, MD 08/28/2018 9:11:51 AM This report has been signed electronically. Number of Addenda: 0 Note Initiated On: 08/28/2018 8:24 AM Scope Withdrawal Time: 0 hours 25 minutes 35 seconds  Total Procedure Duration: 0 hours 29 minutes 3 seconds       [facility]

## 2018-08-28 NOTE — Transfer of Care (Signed)
Immediate Anesthesia Transfer of Care Note  Patient: Brian Williams  Procedure(s) Performed: COLONOSCOPY WITH PROPOFOL (N/A )  Patient Location: PACU  Anesthesia Type:General  Level of Consciousness: awake, alert  and oriented  Airway & Oxygen Therapy: Patient Spontanous Breathing  Post-op Assessment: Report given to RN, Post -op Vital signs reviewed and stable and Patient moving all extremities  Post vital signs: Reviewed and stable  Last Vitals:  Vitals Value Taken Time  BP    Temp    Pulse 84 08/28/2018  9:14 AM  Resp 18 08/28/2018  9:14 AM  SpO2 98 % 08/28/2018  9:14 AM  Vitals shown include unvalidated device data.  Last Pain:  Vitals:   08/28/18 0811  TempSrc: Tympanic  PainSc: 0-No pain         Complications: No apparent anesthesia complications

## 2018-08-28 NOTE — H&P (Signed)
Brian Bellows, MD [address], [place], [place], [place], [address] [address], [place], [place], [place], [address] Phone: [phone]  Fax: [phone]  Primary Care Physician:  Pleas Koch, NP   Pre-Procedure History & Physical: HPI:  Brian Williams is a 57 y.o. male is here for an colonoscopy.   Past Medical History:  Diagnosis Date  . AICD (automatic cardioverter/defibrillator) present   . Anxiety    takes Xanax as needed  . Arthritis    back,knees,right shoulder  . Back pain   . Cardiomyopathy ([place])   . CHF (congestive heart failure) (HCC)    takes Lasix and Aldactone daily  . Coronary artery disease    takes Plavix daily  . Depression    takes Zoloft daily  . Diabetes mellitus without complication (HCC)    Humulin R and Farxiga daily.Fasting blood sugar runs 140  . GERD (gastroesophageal reflux disease)   . Headache   . History of bronchitis   . History of colon polyps    benign  . History of hiatal hernia   . Hyperlipidemia    takes Fenofibrate,Crestor, and Zetia daily  . Hypertension    takes Entresto and Coreg daily  . MI (myocardial infarction) ([facility]) 2001  . Obesity   . Peripheral neuropathy   . Pneumonia    history of-last time about 14 yrs ago  . PONV (postoperative nausea and vomiting)    after knee surgery 25 yrs ago b/p stayed elevated for a while  . Presence of permanent cardiac pacemaker   . Shortness of breath dyspnea    with exertion  . Sleep apnea    uses CPAP nightly  . Ventricular tachycardia ([facility])    s/p RFCA PVCs 2013    Past Surgical History:  Procedure Laterality Date  . BACK SURGERY    . CARDIAC CATHETERIZATION    . CARDIAC CATHETERIZATION Left 05/10/2016   Procedure: Left Heart Cath and Coronary Angiography;  Surgeon: Minna Merritts, MD;  Location: [facility];  Service: Cardiovascular;  Laterality: Left;  . CARDIAC DEFIBRILLATOR PLACEMENT  10/16/2007   ICD Model number 2207-36 serial number [id number]  .  CARDIAC ELECTROPHYSIOLOGY STUDY AND ABLATION    . CHOLECYSTECTOMY  2001  . COLONOSCOPY    . CORONARY ANGIOPLASTY WITH STENT PLACEMENT     7 stents  . CORONARY ARTERY BYPASS GRAFT N/A 05/24/2016   Procedure: CORONARY ARTERY BYPASS GRAFTING (CABG) x four , using left internal mammary artery and left leg greater saphenous vein harvested endoscopically;  Surgeon: Gaye Pollack, MD;  Location: [facility];  Service: Open Heart Surgery;  Laterality: N/A;  . EP IMPLANTABLE DEVICE N/A 06/16/2015   Procedure: ICD Generator Changeout;  Surgeon: Deboraha Sprang, MD;  Location: [facility];  Service: Cardiovascular;  Laterality: N/A;  . ESOPHAGOGASTRODUODENOSCOPY    . INSERT / REPLACE / REMOVE PACEMAKER    . KNEE SURGERY     bilateral knee   . [name] WITHOUT CARDIOVERSION N/A 05/24/2016   Procedure: TRANSESOPHAGEAL ECHOCARDIOGRAM ([name]);  Surgeon: Gaye Pollack, MD;  Location: [facility];  Service: Open Heart Surgery;  Laterality: N/A;  . VASECTOMY      Prior to Admission medications   Medication Sig Start Date End Date Taking? Authorizing Provider  aspirin EC 81 MG tablet Take 81 mg by mouth daily.  06/21/16  Yes Minna Merritts, MD  carvedilol (COREG) 25 MG tablet Take 25 mg by mouth 2 (two) times daily  with a meal.   Yes [provider]  CHANTIX CONTINUING MONTH PAK 1 MG tablet TAKE ONE TABLET BY MOUTH TWICE A DAY 07/01/18  Yes Gollan, Kathlene November, MD  clopidogrel (PLAVIX) 75 MG tablet TAKE ONE TABLET BY MOUTH DAILY 09/16/17  Yes Gollan, Kathlene November, MD  empagliflozin (JARDIANCE) 25 MG TABS tablet Take 25 mg by mouth daily.   Yes [provider]  ezetimibe (ZETIA) 10 MG tablet Take 1 tablet (10 mg total) by mouth daily. 01/21/17  Yes Gollan, Kathlene November, MD  furosemide (LASIX) 40 MG tablet TAKE TWO TABLETS BY MOUTH EVERY MORNING AND TAKE ONE TABLET BY MOUTH EVERY EVENING, Take extra in the afternoon as needed 07/29/18  Yes Gollan, Kathlene November, MD  gemfibrozil (LOPID) 600 MG tablet Take 1 tablet (600  mg total) by mouth daily. 02/13/18  Yes Pleas Koch, NP  ibuprofen (ADVIL,MOTRIN) 200 MG tablet Take 600 mg by mouth every 6 (six) hours as needed.   Yes [provider]  insulin regular human CONCENTRATED (HUMULIN R) 500 UNIT/ML injection For use in pump, for a total of 130 units/day Patient taking differently: For use in pump, for a total of 170 units/day 10/31/17  Yes Renato Shin, MD  potassium chloride SA (K-DUR,KLOR-CON) 20 MEQ tablet Take 20 mEq by mouth 2 (two) times daily.   Yes [provider]  rosuvastatin (CRESTOR) 40 MG tablet Take 1 tablet (40 mg total) by mouth daily. 12/19/16  Yes Cook, Jayce G, DO  sacubitril-valsartan (ENTRESTO) 97-103 MG Take 1 tablet by mouth 2 (two) times daily.   Yes [provider]  sertraline (ZOLOFT) 100 MG tablet Take 2 tablets (200 mg total) by mouth daily. 01/17/18  Yes Pleas Koch, NP  spironolactone (ALDACTONE) 25 MG tablet TAKE 1 TABLET (25 MG TOTAL) BY MOUTH DAILY. 03/15/17  Yes Minna Merritts, MD  traZODone (DESYREL) 100 MG tablet TAKE TWO TABLETS BY MOUTH AT BEDTIME 06/16/18  Yes Pleas Koch, NP    Allergies as of 08/25/2018 - Review Complete 08/25/2018  Allergen Reaction Noted  . Morphine and related Nausea And Vomiting 05/16/2016  . Naproxen Other (See Comments) 04/20/2015    Family History  Problem Relation Age of Onset  . Heart attack Mother 21  . Hypertension Mother   . Heart attack Father 46  . Hypertension Father   . Hypertension Maternal Uncle   . Heart disease Maternal Uncle   . Heart disease Maternal Grandmother   . Stroke Maternal Grandmother   . Diabetes Neg Hx     Social History   Socioeconomic History  . Marital status: Married    Spouse name: Not on file  . Number of children: Not on file  . Years of education: Not on file  . Highest education level: Not on file  Occupational History  . Not on file  Social Needs  . Financial resource strain: Not on file  . Food  insecurity:    Worry: Not on file    Inability: Not on file  . Transportation needs:    Medical: Not on file    Non-medical: Not on file  Tobacco Use  . Smoking status: Former Smoker    Packs/day: 0.25    Years: 25.00    Pack years: 6.25    Types: Cigarettes    Last attempt to quit: 05/19/2018    Years since quitting: 0.2  . Smokeless tobacco: Never Used  Substance and Sexual Activity  . Alcohol use: No  Alcohol/week: 0.0 - 1.0 standard drinks  . Drug use: No  . Sexual activity: Not Currently  Lifestyle  . Physical activity:    Days per week: Not on file    Minutes per session: Not on file  . Stress: Not on file  Relationships  . Social connections:    Talks on phone: Not on file    Gets together: Not on file    Attends religious service: Not on file    Active member of club or organization: Not on file    Attends meetings of clubs or organizations: Not on file    Relationship status: Not on file  . Intimate partner violence:    Fear of current or ex partner: Not on file    Emotionally abused: Not on file    Physically abused: Not on file    Forced sexual activity: Not on file  Other Topics Concern  . Not on file  Social History Narrative   Married.   Moved from [place].   Disabled.    Review of Systems: See HPI, otherwise negative ROS  Physical Exam: BP 119/73   Pulse 82   Temp 98.6 F (37 C) (Tympanic)   Resp 18   Ht 6' (1.829 m)   Wt 131.5 kg   SpO2 98%   BMI 39.33 kg/m  General:   Alert,  pleasant and cooperative in NAD Head:  Normocephalic and atraumatic. Neck:  Supple; no masses or thyromegaly. Lungs:  Clear throughout to auscultation, normal respiratory effort.    Heart:  +S1, +S2, Regular rate and rhythm, No edema. Abdomen:  Soft, nontender and nondistended. Normal bowel sounds, without guarding, and without rebound.   Neurologic:  Alert and  oriented x4;  grossly normal neurologically.  Impression/Plan: Brian Williams is here for an  colonoscopy to be performed for surveillance due to prior history of colon polyps   Risks, benefits, limitations, and alternatives regarding  colonoscopy have been reviewed with the patient.  Questions have been answered.  All parties agreeable.   Brian Bellows, MD  08/28/2018, 8:24 AM

## 2018-08-28 NOTE — Anesthesia Preprocedure Evaluation (Signed)
Anesthesia Evaluation  Patient identified by MRN, date of birth, ID band Patient awake    Reviewed: Allergy & Precautions, NPO status , Patient's Chart, lab work & pertinent test results  History of Anesthesia Complications (+) PONV and history of anesthetic complications  Airway Mallampati: III  TM Distance: >3 FB Neck ROM: Full    Dental  (+) Poor Dentition   Pulmonary sleep apnea and Continuous Positive Airway Pressure Ventilation , neg COPD, former smoker,    breath sounds clear to auscultation- rhonchi (-) wheezing      Cardiovascular hypertension, (-) angina+ CAD, + Past MI, + Cardiac Stents (all prior to CABG), + CABG and +CHF  + pacemaker + Cardiac Defibrillator  Rhythm:Regular Rate:Normal - Systolic murmurs and - Diastolic murmurs Echo 12/10/69:  Dilated left ventricle - mild  Severely decreased left ventricular systolic function, ejection fraction  25 to 30%  Segmental wall motion abnormality - (anteroseptal)  Diastolic dysfunction - grade II (elevated filling pressures)  Dilated left atrium - mild  Low normal right ventricular systolic function  Mildly elevated right atrial pressure   Neuro/Psych  Headaches, neg Seizures PSYCHIATRIC DISORDERS Anxiety Depression    GI/Hepatic Neg liver ROS, hiatal hernia, GERD  ,  Endo/Other  diabetes, Insulin Dependent  Renal/GU negative Renal ROS     Musculoskeletal  (+) Arthritis ,   Abdominal (+) + obese,   Peds  Hematology negative hematology ROS (+)   Anesthesia Other Findings Past Medical History: No date: AICD (automatic cardioverter/defibrillator) present No date: Anxiety     Comment:  takes Xanax as needed No date: Arthritis     Comment:  back,knees,right shoulder No date: Back pain No date: Cardiomyopathy ([facility]) No date: CHF (congestive heart failure) (HCC)     Comment:  takes Lasix and Aldactone daily No date: Coronary artery disease     Comment:   takes Plavix daily No date: Depression     Comment:  takes Zoloft daily No date: Diabetes mellitus without complication (HCC)     Comment:  Humulin R and Farxiga daily.Fasting blood sugar runs 140 No date: GERD (gastroesophageal reflux disease) No date: Headache No date: History of bronchitis No date: History of colon polyps     Comment:  benign No date: History of hiatal hernia No date: Hyperlipidemia     Comment:  takes Ecologist, and Zetia daily No date: Hypertension     Comment:  takes Ship broker and Coreg daily 2001: MI (myocardial infarction) ([facility]) No date: Obesity No date: Peripheral neuropathy No date: Pneumonia     Comment:  history of-last time about 14 yrs ago No date: PONV (postoperative nausea and vomiting)     Comment:  after knee surgery 25 yrs ago b/p stayed elevated for a               while No date: Presence of permanent cardiac pacemaker No date: Shortness of breath dyspnea     Comment:  with exertion No date: Sleep apnea     Comment:  uses CPAP nightly No date: Ventricular tachycardia ([facility])     Comment:  s/p RFCA PVCs 2013   Reproductive/Obstetrics                             Anesthesia Physical Anesthesia Plan  ASA: III  Anesthesia Plan: General   Post-op Pain Management:    Induction: Intravenous  PONV Risk Score and Plan: 2 and Propofol infusion  Airway Management  Planned: Natural Airway  Additional Equipment:   Intra-op Plan:   Post-operative Plan:   Informed Consent: I have reviewed the patients History and Physical, chart, labs and discussed the procedure including the risks, benefits and alternatives for the proposed anesthesia with the patient or authorized representative who has indicated his/her understanding and acceptance.   Dental advisory given  Plan Discussed with: CRNA and Anesthesiologist  Anesthesia Plan Comments:         Anesthesia Quick Evaluation

## 2018-08-29 ENCOUNTER — Other Ambulatory Visit: Payer: Self-pay | Admitting: Primary Care

## 2018-08-29 DIAGNOSIS — E785 Hyperlipidemia, unspecified: Secondary | ICD-10-CM

## 2018-08-29 LAB — SURGICAL PATHOLOGY

## 2018-08-31 ENCOUNTER — Encounter: Payer: Self-pay | Admitting: Gastroenterology

## 2018-10-09 ENCOUNTER — Telehealth: Payer: Self-pay | Admitting: Cardiovascular Disease

## 2018-10-10 NOTE — Telephone Encounter (Signed)
To Dr. Rockey Situ to review- ok to refill Chantix?

## 2018-10-10 NOTE — Telephone Encounter (Signed)
Please review for refill on Chantix.  Dr. Rockey Situ prescribed in Oct. 2019 with 2 refills. Does Dr. Rockey Situ want to continue to refill or should the patient follow up with PCP?

## 2018-10-12 NOTE — Telephone Encounter (Signed)
Ok to refill chantix 1 mg po BID #60, 3 refills

## 2018-10-30 ENCOUNTER — Ambulatory Visit: Payer: BLUE CROSS/BLUE SHIELD | Admitting: Primary Care

## 2018-11-21 ENCOUNTER — Telehealth: Payer: Self-pay | Admitting: Internal Medicine

## 2018-11-21 NOTE — Telephone Encounter (Signed)
I cancelled the patient home remote appointment.

## 2018-11-21 NOTE — Telephone Encounter (Signed)
Pt would like to cancel his remote check due to [facility] is no longer in network with his insurance.

## 2018-11-26 DIAGNOSIS — F32A Depression, unspecified: Secondary | ICD-10-CM

## 2018-11-26 DIAGNOSIS — F419 Anxiety disorder, unspecified: Secondary | ICD-10-CM

## 2018-11-26 DIAGNOSIS — F329 Major depressive disorder, single episode, unspecified: Secondary | ICD-10-CM

## 2018-11-27 MED ORDER — SERTRALINE HCL 100 MG PO TABS: 200.0000 mg | ORAL_TABLET | Freq: Every day | ORAL | 1 refills | Status: DC

## 2018-12-03 ENCOUNTER — Other Ambulatory Visit: Payer: Self-pay | Admitting: Cardiovascular Disease

## 2019-01-28 ENCOUNTER — Encounter: Payer: Self-pay | Admitting: Primary Care

## 2019-01-28 ENCOUNTER — Ambulatory Visit (INDEPENDENT_AMBULATORY_CARE_PROVIDER_SITE_OTHER): Payer: BLUE CROSS/BLUE SHIELD | Admitting: Primary Care

## 2019-01-28 DIAGNOSIS — M791 Myalgia, unspecified site: Secondary | ICD-10-CM | POA: Diagnosis not present

## 2019-01-28 MED ORDER — CYCLOBENZAPRINE HCL 5 MG PO TABS: 5.0000 mg | ORAL_TABLET | Freq: Three times a day (TID) | ORAL | 0 refills | Status: DC | PRN

## 2019-01-28 NOTE — Progress Notes (Signed)
Subjective:    Patient ID: Brian Williams, male    DOB: October 28, 1960, 58 y.o.   MRN: [id number]  HPI  Virtual Visit via Video Note  I connected with Brian Williams on 01/28/19 at  8:40 AM EDT by a video enabled telemedicine application and verified that I am speaking with the correct person using two identifiers.  Location: Patient: Home  Provider: Office   I discussed the limitations of evaluation and management by telemedicine and the availability of in person appointments. The patient expressed understanding and agreed to proceed.  History of Present Illness:  Brian Williams is a 58 year old male with a history of type 2 diabetes, tobacco abuse, CHF, hypertension, neuropathy, chronic back pain, hyperlipidemia (managed on statin), CAD who presents today with a chief complaint of lower extremity cramping.  He also endorses pain. He describes his symptoms as achy, cramping to the muscles. His symptoms are located to the upper thighs down to the calves that is bothersome throughout the entire day (rest and activity). This began a few months ago and has progressed over the last several weeks. He's also noticed some plantar fascitis pain. He's taken Tylenol and Advil without improvement.   He denies calf swelling, color changes to the skin, chest pain, shortness of breath, long travel, numbness/tingling, back pain, lower extremity weakness. He is no longer smoking. His pain is improved with movement at times.    Observations/Objective:  Alert and oriented. Appears well, not sickly. No distress. Speaking in complete sentences.   Assessment and Plan:  See problem based charting.  Follow Up Instructions:  Call the main line to schedule a lab appointment for today.  Hold your rosuvastatin (Crestor) medication for two weeks as discussed.  You may take the cyclobenzaprine (Flexeril) muscle relaxer for the next 2-3 days but stop this by then.  Please update me in one week.  It was a  pleasure to see you today! Allie Bossier, NP-C    I discussed the assessment and treatment plan with the patient. The patient was provided an opportunity to ask questions and all were answered. The patient agreed with the plan and demonstrated an understanding of the instructions.   The patient was advised to call back or seek an in-person evaluation if the symptoms worsen or if the condition fails to improve as anticipated.     Pleas Koch, NP    Review of Systems  Respiratory: Negative for shortness of breath.   Cardiovascular: Negative for chest pain and palpitations.       Denies calf swelling  Musculoskeletal: Positive for myalgias. Negative for back pain.  Skin: Negative for color change.       Past Medical History:  Diagnosis Date   AICD (automatic cardioverter/defibrillator) present    Anxiety    takes Xanax as needed   Arthritis    back,knees,right shoulder   Back pain    Cardiomyopathy ([facility])    CHF (congestive heart failure) (HCC)    takes Lasix and Aldactone daily   Coronary artery disease    takes Plavix daily   Depression    takes Zoloft daily   Diabetes mellitus without complication (HCC)    Humulin R and Farxiga daily.Fasting blood sugar runs 140   GERD (gastroesophageal reflux disease)    Headache    History of bronchitis    History of colon polyps    benign   History of hiatal hernia    Hyperlipidemia    takes  Fenofibrate,Crestor, and Zetia daily   Hypertension    takes Ship broker and Coreg daily   MI (myocardial infarction) ([facility]) 2001   Obesity    Peripheral neuropathy    Pneumonia    history of-last time about 14 yrs ago   PONV (postoperative nausea and vomiting)    after knee surgery 25 yrs ago b/p stayed elevated for a while   Presence of permanent cardiac pacemaker    Shortness of breath dyspnea    with exertion   Sleep apnea    uses CPAP nightly   Ventricular tachycardia (HCC)    s/p RFCA PVCs 2013       Social History   Socioeconomic History   Marital status: Married    Spouse name: Not on file   Number of children: Not on file   Years of education: Not on file   Highest education level: Not on file  Occupational History   Not on file  Social Needs   Financial resource strain: Not on file   Food insecurity:    Worry: Not on file    Inability: Not on file   Transportation needs:    Medical: Not on file    Non-medical: Not on file  Tobacco Use   Smoking status: Former Smoker    Packs/day: 0.25    Years: 25.00    Pack years: 6.25    Types: Cigarettes    Last attempt to quit: 05/19/2018    Years since quitting: 0.6   Smokeless tobacco: Never Used  Substance and Sexual Activity   Alcohol use: No    Alcohol/week: 0.0 - 1.0 standard drinks   Drug use: No   Sexual activity: Not Currently  Lifestyle   Physical activity:    Days per week: Not on file    Minutes per session: Not on file   Stress: Not on file  Relationships   Social connections:    Talks on phone: Not on file    Gets together: Not on file    Attends religious service: Not on file    Active member of club or organization: Not on file    Attends meetings of clubs or organizations: Not on file    Relationship status: Not on file   Intimate partner violence:    Fear of current or ex partner: Not on file    Emotionally abused: Not on file    Physically abused: Not on file    Forced sexual activity: Not on file  Other Topics Concern   Not on file  Social History Narrative   Married.   Moved from [place].   Disabled.    Past Surgical History:  Procedure Laterality Date   BACK SURGERY     CARDIAC CATHETERIZATION     CARDIAC CATHETERIZATION Left 05/10/2016   Procedure: Left Heart Cath and Coronary Angiography;  Surgeon: Minna Merritts, MD;  Location: [facility];  Service: Cardiovascular;  Laterality: Left;   CARDIAC DEFIBRILLATOR PLACEMENT  10/16/2007   ICD Model number  2207-36 serial number [id number]   CARDIAC ELECTROPHYSIOLOGY STUDY AND ABLATION     CHOLECYSTECTOMY  2001   COLONOSCOPY     COLONOSCOPY WITH PROPOFOL N/A 08/28/2018   Procedure: COLONOSCOPY WITH PROPOFOL;  Surgeon: Jonathon Bellows, MD;  Location: [facility];  Service: Gastroenterology;  Laterality: N/A;   CORONARY ANGIOPLASTY WITH STENT PLACEMENT     7 stents   CORONARY ARTERY BYPASS GRAFT N/A 05/24/2016   Procedure: CORONARY ARTERY BYPASS GRAFTING (CABG)  x four , using left internal mammary artery and left leg greater saphenous vein harvested endoscopically;  Surgeon: Gaye Pollack, MD;  Location: [facility];  Service: Open Heart Surgery;  Laterality: N/A;   EP IMPLANTABLE DEVICE N/A 06/16/2015   Procedure: ICD Generator Changeout;  Surgeon: Deboraha Sprang, MD;  Location: [facility];  Service: Cardiovascular;  Laterality: N/A;   ESOPHAGOGASTRODUODENOSCOPY     INSERT / REPLACE / REMOVE PACEMAKER     KNEE SURGERY     bilateral knee    [name] WITHOUT CARDIOVERSION N/A 05/24/2016   Procedure: TRANSESOPHAGEAL ECHOCARDIOGRAM ([name]);  Surgeon: Gaye Pollack, MD;  Location: [facility];  Service: Open Heart Surgery;  Laterality: N/A;   VASECTOMY      Family History  Problem Relation Age of Onset   Heart attack Mother 49   Hypertension Mother    Heart attack Father 7   Hypertension Father    Hypertension Maternal Uncle    Heart disease Maternal Uncle    Heart disease Maternal Grandmother    Stroke Maternal Grandmother    Diabetes Neg Hx     Allergies  Allergen Reactions   Morphine And Related Nausea And Vomiting   Naproxen Other (See Comments)    Causes hyperactivity    Current Outpatient Medications on File Prior to Visit  Medication Sig Dispense Refill   aspirin EC 81 MG tablet Take 81 mg by mouth daily.  90 tablet    carvedilol (COREG) 25 MG tablet Take 25 mg by mouth 2 (two) times daily with a meal.     CHANTIX CONTINUING MONTH PAK 1 MG tablet TAKE ONE TABLET BY  MOUTH TWICE A DAY 60 tablet 1   clopidogrel (PLAVIX) 75 MG tablet TAKE ONE TABLET BY MOUTH DAILY 90 tablet 1   empagliflozin (JARDIANCE) 25 MG TABS tablet Take 25 mg by mouth daily.     ezetimibe (ZETIA) 10 MG tablet Take 1 tablet (10 mg total) by mouth daily. 90 tablet 3   furosemide (LASIX) 40 MG tablet TAKE TWO TABLETS BY MOUTH EVERY MORNING AND TAKE ONE TABLET BY MOUTH EVERY EVENING, Take extra in the afternoon as needed 360 tablet 3   gemfibrozil (LOPID) 600 MG tablet TAKE ONE TABLET BY MOUTH DAILY 90 tablet 1   ibuprofen (ADVIL,MOTRIN) 200 MG tablet Take 600 mg by mouth every 6 (six) hours as needed.     insulin regular human CONCENTRATED (HUMULIN R) 500 UNIT/ML injection For use in pump, for a total of 130 units/day (Patient taking differently: For use in pump, for a total of 170 units/day) 20 mL 11   potassium chloride SA (K-DUR,KLOR-CON) 20 MEQ tablet Take 20 mEq by mouth 2 (two) times daily.     rosuvastatin (CRESTOR) 40 MG tablet Take 1 tablet (40 mg total) by mouth daily. 90 tablet 3   sacubitril-valsartan (ENTRESTO) 97-103 MG Take 1 tablet by mouth 2 (two) times daily.     sertraline (ZOLOFT) 100 MG tablet Take 2 tablets (200 mg total) by mouth daily. 180 tablet 1   spironolactone (ALDACTONE) 25 MG tablet TAKE 1 TABLET (25 MG TOTAL) BY MOUTH DAILY. 90 tablet 3   traZODone (DESYREL) 100 MG tablet TAKE TWO TABLETS BY MOUTH EVERY NIGHT AT BEDTIME 180 tablet 1   No current facility-administered medications on file prior to visit.     Wt 290 lb (131.5 kg)    BMI 39.33 kg/m    Objective:   Physical Exam  Constitutional: He is  oriented to person, place, and time. He appears well-nourished.  Respiratory: Effort normal.  Neurological: He is alert and oriented to person, place, and time.  Psychiatric: He has a normal mood and affect.           Assessment & Plan:

## 2019-01-28 NOTE — Patient Instructions (Signed)
Call the main line to schedule a lab appointment for today.  Hold your rosuvastatin (Crestor) medication for two weeks as discussed.  You may take the cyclobenzaprine (Flexeril) muscle relaxer for the next 2-3 days but stop this by then.  Please update me in one week.  It was a pleasure to see you today! Allie Bossier, NP-C

## 2019-01-28 NOTE — Assessment & Plan Note (Signed)
Located to bilateral lower extremities. Doesn't appear to be claudication, arterial cause should most certainly remain on differential list.   Could very well be statin induced myalgia, also checking labs today including magnesium, potassium, renal function.   Discussed to hold statin for 2 weeks.  Will also provide him with Flexeril to use for a few days until Crestor is out of his system.   Discussed to update me in one week.

## 2019-01-29 ENCOUNTER — Other Ambulatory Visit: Payer: Self-pay | Admitting: Cardiovascular Disease

## 2019-01-29 NOTE — Telephone Encounter (Signed)
Refill sent in for Chantix with 2 refills.

## 2019-01-29 NOTE — Telephone Encounter (Signed)
Please review for refill.  

## 2019-01-30 ENCOUNTER — Other Ambulatory Visit (INDEPENDENT_AMBULATORY_CARE_PROVIDER_SITE_OTHER): Payer: BLUE CROSS/BLUE SHIELD

## 2019-01-30 DIAGNOSIS — M791 Myalgia, unspecified site: Secondary | ICD-10-CM | POA: Diagnosis not present

## 2019-01-30 LAB — BASIC METABOLIC PANEL
BUN: 20 mg/dL (ref 6–23)
CO2: 25 mEq/L (ref 19–32)
Calcium: 9.1 mg/dL (ref 8.4–10.5)
Chloride: 102 mEq/L (ref 96–112)
Creatinine, Ser: 0.83 mg/dL (ref 0.40–1.50)
GFR: 95.16 mL/min (ref >60.00)
Glucose, Bld: 203 mg/dL — ABNORMAL HIGH (ref 70–99)
Potassium: 4 mEq/L (ref 3.5–5.1)
Sodium: 139 mEq/L (ref 135–145)

## 2019-01-30 LAB — CBC
HCT: 33.5 % — ABNORMAL LOW (ref 39.0–52.0)
Hemoglobin: 11.6 g/dL — ABNORMAL LOW (ref 13.0–17.0)
MCHC: 34.8 g/dL (ref 30.0–36.0)
MCV: 92.9 fl (ref 78.0–100.0)
Platelets: 134 10*3/uL — ABNORMAL LOW (ref 150.0–400.0)
RBC: 3.6 Mil/uL — ABNORMAL LOW (ref 4.22–5.81)
RDW: 15.6 % — ABNORMAL HIGH (ref 11.5–15.5)
WBC: 4.9 10*3/uL (ref 4.0–10.5)

## 2019-01-30 LAB — MAGNESIUM: Magnesium: 2.1 mg/dL (ref 1.5–2.5)

## 2019-02-03 ENCOUNTER — Other Ambulatory Visit: Payer: Self-pay

## 2019-02-03 ENCOUNTER — Ambulatory Visit: Payer: BLUE CROSS/BLUE SHIELD | Admitting: *Deleted

## 2019-02-04 LAB — CUP PACEART REMOTE DEVICE CHECK
Date Time Interrogation Session: 20200520075551
Implantable Lead Implant Date: 20160929
Implantable Lead Implant Date: 20160929
Implantable Lead Location: 753859
Implantable Lead Location: 753860
Implantable Lead Model: 7071
Implantable Pulse Generator Implant Date: 20160929
Pulse Gen Serial Number: 7306375

## 2019-02-10 ENCOUNTER — Telehealth: Payer: Self-pay

## 2019-02-10 NOTE — Telephone Encounter (Signed)
Maybe we can get him with EP at [facility]-- the costs for care otherwise wiil be very high and the most important part of this is him.   I expect the insurance thing to be sorted out in the fall  B  How are you feeling  I heard you hve been sick   ( a little birdie EW told me)

## 2019-02-10 NOTE — Telephone Encounter (Signed)
Received voice mail message stating there is a scheduled remote showing on his mychart and he did not think one would be scheduled because his insurance is not accepted.  Patient is not followed in [facility] clinic and will route to [facility] clinic for return call to patient.

## 2019-02-10 NOTE — Telephone Encounter (Signed)
Patient states that he was not planning to changed MD b/c of the insurance.He wants to know if he will be responsible for a bill for an appt he cancelled. I informed pt that I would speak w/ management about the remote appt for 02/03/2019 and billing for that appt and a plan for moving forward. I informed pt that not following his ICD for 1 year is probably not the best option. Pt verbalized understanding.

## 2019-02-11 NOTE — Telephone Encounter (Signed)
Spoke with patient. He reports that his insurance is now through [employer] and he is covered for f/u at [facility], but not at [facility]. Advised we can cancel the charges for 02/03/19 as he had called to cancel the remote appointment on 11/21/18.    Pt reports he wishes to wait on f/u with our office until he has new insurance in 1.5 years. Encouraged pt to establish with in-network EP as we cannot recommend that he go 1.5 years without ICD f/u. Pt verbalizes understanding, reports he has established care with [facility] and is willing to establish with an EP there. He is aware that they will request his Merlin monitoring once he establishes there.

## 2019-02-18 ENCOUNTER — Other Ambulatory Visit: Payer: Self-pay | Admitting: Primary Care

## 2019-02-18 DIAGNOSIS — E7800 Pure hypercholesterolemia, unspecified: Secondary | ICD-10-CM

## 2019-02-18 MED ORDER — ROSUVASTATIN CALCIUM 20 MG PO TABS: 20.0000 mg | ORAL_TABLET | Freq: Every day | ORAL | 0 refills | Status: DC

## 2019-02-18 NOTE — Assessment & Plan Note (Signed)
Complaints of myalgia to lower extremities, resolved with 2 week hold of Crestor 40 mg. Will reduce dose to 20 mg and have him update in 2 weeks. If tolerable then we will repeat lipids in 4 weeks to ensure LDL is at goal of <70.

## 2019-03-12 ENCOUNTER — Other Ambulatory Visit: Payer: Self-pay | Admitting: Primary Care

## 2019-04-10 ENCOUNTER — Other Ambulatory Visit: Payer: Self-pay | Admitting: Primary Care

## 2019-04-10 DIAGNOSIS — E785 Hyperlipidemia, unspecified: Secondary | ICD-10-CM

## 2019-05-18 ENCOUNTER — Other Ambulatory Visit: Payer: Self-pay | Admitting: Cardiovascular Disease

## 2019-05-18 NOTE — Telephone Encounter (Signed)
Please review for refill. Thanks!  

## 2019-05-18 NOTE — Telephone Encounter (Signed)
Patient seems to be seeing cardiologist at [facility] most recently.  Has not seen [facility] since 07/2018. Patient needs appointment with Dr Rockey Situ to discuss. Refill refused.

## 2019-06-10 ENCOUNTER — Other Ambulatory Visit: Payer: Self-pay | Admitting: Primary Care

## 2019-06-18 ENCOUNTER — Telehealth: Payer: Self-pay

## 2019-06-18 NOTE — Telephone Encounter (Signed)
[place] Night - Client TELEPHONE ADVICE RECORD [facility] Patient Name: Brian Williams Gender: Male DOB: [birth year] Age: 58 Y 63 M 8 D Return Phone Number: Address: City/State/Zip: [place] [address] Client [place] Night - Client Client Site [place] Physician Alma Friendly - NP Contact Type Call Who Is Calling Pharmacy Call Type Pharmacy Send to RN Chief Complaint Paging or Request for Consult Reason for Call Request to speak to Physician Initial Comment Levada Dy calling from [place] for clarification regarding drug interaction. Additional Comment Levada Dy calling from [place] for clarification regarding drug interaction. Pharmacy Name [facility] Pharmacist Name [place] Number [phone] Translation No Nurse Assessment Nurse: Antionette Fairy, RN, Romelle Starcher Date/Time Eilene Ghazi Time): 06/17/2019 6:31:49 PM Confirm and document reason for call. If symptomatic, describe symptoms. ---Caller is pharmacist calling about potential interactions between Gemfibrozil and Rosuvastatin (Crestor) states the Gemfibrozil can increase the levels of Crestor in the blood. I advised I would call the doctor and get back with her. Has the patient had close contact with a person known or suspected to have the novel coronavirus illness OR traveled / lives in area with major community spread (including international travel) in the last 14 days from the onset of symptoms? * If Asymptomatic, screen for exposure and travel within the last 14 days. ---No Does the patient have any new or worsening symptoms? ---No Guidelines Guideline Title Affirmed Question Affirmed Notes Nurse Date/Time (Eastern Time) Disp. Time Eilene Ghazi Time) Disposition Final User 06/17/2019 6:15:33 PM Pharmacy Call Antionette Fairy, RN, Romelle Starcher Reason: extended hold time with pharmacy 06/17/2019 6:17:15 PM Attempt made -  no message left Antionette Fairy, RN, [facility] 06/17/2019 6:17:59 PM Attempt made - line busy Antionette Fairy, RN, [facility] 06/17/2019 6:37:33 PM Paged On Call back to [facility] [place], [facility] 06/17/2019 6:17:53 PM Clinical Call Yes Antionette Fairy, RN, Romelle Starcher PLEASE NOTE: All timestamps contained within this report are represented as [place] Standard Time. CONFIDENTIALTY NOTICE: This fax transmission is intended only for the addressee. It contains information that is legally privileged, confidential or otherwise protected from use or disclosure. If you are not the intended recipient, you are strictly prohibited from reviewing, disclosing, copying using or disseminating any of this information or taking any action in reliance on or regarding this information. If you have received this fax in error, please notify us immediately by telephone so that we can arrange for its return to [place]. Phone: [phone], Toll-Free: [phone], Fax: [phone] Page: 2 of 2 Call Id: [id number] Paging DoctorName Phone DateTime Result/Outcome Message Type Notes Walker Kehr - MD [id number] 06/17/2019 6:37:33 PM Paged On Call Back to Call Center Doctor Paged Walker Kehr - MD 06/17/2019 6:37:54 PM Spoke with On Call - General Message Result States it can be sorted out tomorrow.

## 2019-06-22 NOTE — Telephone Encounter (Addendum)
I believe he's been taking gemfibrozil prior to establishing care in our clinic. He is followed by cardiology at present.  Also managed on rosuvastatin and Zetia.  Dr. Rockey Situ, any thoughts/concerns regarding the message below? This patient is scheduled to see me in November for a routine physical.  I do plan to repeat labs at that time.

## 2019-06-23 NOTE — Telephone Encounter (Signed)
If there is concern from pharmacy We can stop the gemfibrozil  Triage  can we see if he would be willing to  starting vascepa 2 tabs in morning 2 tabs in the p.m. And stop gemfibrozil This would help with his triglycerides, also with some cardiovascular benefit Would need a coupon

## 2019-06-23 NOTE — Telephone Encounter (Signed)
Noted and appreciate the assistance.  °

## 2019-07-07 ENCOUNTER — Other Ambulatory Visit: Payer: Self-pay | Admitting: Primary Care

## 2019-07-07 DIAGNOSIS — F329 Major depressive disorder, single episode, unspecified: Secondary | ICD-10-CM

## 2019-07-07 DIAGNOSIS — F32A Depression, unspecified: Secondary | ICD-10-CM

## 2019-07-07 DIAGNOSIS — F419 Anxiety disorder, unspecified: Secondary | ICD-10-CM

## 2019-07-17 ENCOUNTER — Other Ambulatory Visit: Payer: Self-pay | Admitting: Primary Care

## 2019-07-17 DIAGNOSIS — E785 Hyperlipidemia, unspecified: Secondary | ICD-10-CM

## 2019-07-23 ENCOUNTER — Encounter: Payer: Self-pay | Admitting: Primary Care

## 2019-07-23 ENCOUNTER — Ambulatory Visit (INDEPENDENT_AMBULATORY_CARE_PROVIDER_SITE_OTHER): Payer: BLUE CROSS/BLUE SHIELD | Admitting: Primary Care

## 2019-07-23 ENCOUNTER — Other Ambulatory Visit: Payer: Self-pay

## 2019-07-23 VITALS — BP 108/58 | HR 71 | Temp 96.8°F | Ht 72.0 in | Wt 293.5 lb

## 2019-07-23 DIAGNOSIS — I5022 Chronic systolic (congestive) heart failure: Secondary | ICD-10-CM | POA: Diagnosis not present

## 2019-07-23 DIAGNOSIS — I2510 Atherosclerotic heart disease of native coronary artery without angina pectoris: Secondary | ICD-10-CM | POA: Diagnosis not present

## 2019-07-23 DIAGNOSIS — F331 Major depressive disorder, recurrent, moderate: Secondary | ICD-10-CM

## 2019-07-23 DIAGNOSIS — F411 Generalized anxiety disorder: Secondary | ICD-10-CM

## 2019-07-23 DIAGNOSIS — E1142 Type 2 diabetes mellitus with diabetic polyneuropathy: Secondary | ICD-10-CM

## 2019-07-23 DIAGNOSIS — Z0000 Encounter for general adult medical examination without abnormal findings: Secondary | ICD-10-CM

## 2019-07-23 DIAGNOSIS — Z95810 Presence of automatic (implantable) cardiac defibrillator: Secondary | ICD-10-CM

## 2019-07-23 DIAGNOSIS — E118 Type 2 diabetes mellitus with unspecified complications: Secondary | ICD-10-CM

## 2019-07-23 DIAGNOSIS — Z9989 Dependence on other enabling machines and devices: Secondary | ICD-10-CM

## 2019-07-23 DIAGNOSIS — E7800 Pure hypercholesterolemia, unspecified: Secondary | ICD-10-CM

## 2019-07-23 DIAGNOSIS — G4733 Obstructive sleep apnea (adult) (pediatric): Secondary | ICD-10-CM

## 2019-07-23 DIAGNOSIS — G4700 Insomnia, unspecified: Secondary | ICD-10-CM

## 2019-07-23 DIAGNOSIS — IMO0002 Reserved for concepts with insufficient information to code with codable children: Secondary | ICD-10-CM

## 2019-07-23 DIAGNOSIS — E1165 Type 2 diabetes mellitus with hyperglycemia: Secondary | ICD-10-CM

## 2019-07-23 DIAGNOSIS — I10 Essential (primary) hypertension: Secondary | ICD-10-CM | POA: Diagnosis not present

## 2019-07-23 DIAGNOSIS — I25118 Atherosclerotic heart disease of native coronary artery with other forms of angina pectoris: Secondary | ICD-10-CM

## 2019-07-23 MED ORDER — "PEN NEEDLES 5/16"" 30G X 8 MM MISC": 5 refills | Status: DC

## 2019-07-23 NOTE — Assessment & Plan Note (Signed)
Underwent cardiac catheterization several weeks ago, reviewed in chart. No plan for intervention for blockage and calcium deposit. Following with cardiology.  Continue beta blocker, clopidogrel, lipid management, Entresto, BP control. LDL at goal from labs collected in October 2020.

## 2019-07-23 NOTE — Assessment & Plan Note (Signed)
Uncontrolled on current regimen of Zoloft 200 mg and Trazodone. PHQ 9 score of 15 and GAD 7 score of 12 today. Denies SI/HI.  Discussed options for treatment, he declines therapy. Given that he's been on Zoloft for many years and continues chronic symptoms we need to change treatment.  Will consult with psychiatry, consider switching from Zoloft to Cymbalta given chronic pain along with anxiety an depression. He agrees.

## 2019-07-23 NOTE — Assessment & Plan Note (Signed)
Overall doing well on Trazodone, continue same.

## 2019-07-23 NOTE — Assessment & Plan Note (Signed)
Following with heart failure clinic through [facility]. He is weighing himself twice weekly, no acute changes. Encouraged daily weights.  Continue current regimen.

## 2019-07-23 NOTE — Progress Notes (Signed)
Subjective:    Patient ID: Brian Williams, male    DOB: 1961-02-06, 58 y.o.   MRN: [id number]  HPI  Brian Williams is a 58 year old male who presents today for complete physical.  Chronic anxiety and depression symptoms, has been on Zoloft 200 mg for years and really doesn't feel as though it's effective. He continues to feel sad, down, irritable, worthless. PHQ 9 score of 15 and GAD 7 score 12 today.   Immunizations: -Tetanus: Completed in 2019 -Influenza: Completed this season  -Shingles: Never completed, never had chicken pox.  -Pneumonia: Completed last in 2019  Diet: He endorses a healthy diet. Is eating chicken, salads, starch, little take out/fast food. Desserts several times weekly. Drinking diet soda, unsweet tea, water.  Exercise: He is not exercising, active around his home  Eye exam: Due, he will schedule  Dental exam: Completed 6 years ago  Colonoscopy: Completed in 2019, due in 2022. PSA: 0.21 in 2019 Hep C Screen: Negative  BP Readings from Last 3 Encounters:  07/23/19 (!) 108/58  08/28/18 109/69  08/12/18 124/76   Wt Readings from Last 3 Encounters:  07/23/19 293 lb 8 oz (133.1 kg)  01/28/19 290 lb (131.5 kg)  08/28/18 290 lb (131.5 kg)      Review of Systems  Constitutional: Negative for unexpected weight change.  HENT: Negative for rhinorrhea.   Respiratory: Negative for cough and shortness of breath.   Cardiovascular: Negative for chest pain.  Gastrointestinal: Negative for constipation and diarrhea.  Genitourinary: Negative for difficulty urinating.  Musculoskeletal: Positive for arthralgias and back pain. Negative for myalgias.  Skin: Negative for rash.  Allergic/Immunologic: Negative for environmental allergies.  Neurological: Negative for dizziness and headaches.  Psychiatric/Behavioral:       See HPI       Past Medical History:  Diagnosis Date  . AICD (automatic cardioverter/defibrillator) present   . Anxiety    takes Xanax as needed   . Arthritis    back,knees,right shoulder  . Back pain   . Cardiomyopathy ([place])   . CHF (congestive heart failure) (HCC)    takes Lasix and Aldactone daily  . Coronary artery disease    takes Plavix daily  . Depression    takes Zoloft daily  . Diabetes mellitus without complication (HCC)    Humulin R and Farxiga daily.Fasting blood sugar runs 140  . GERD (gastroesophageal reflux disease)   . Headache   . History of bronchitis   . History of colon polyps    benign  . History of hiatal hernia   . Hyperlipidemia    takes Fenofibrate,Crestor, and Zetia daily  . Hypertension    takes Entresto and Coreg daily  . MI (myocardial infarction) ([facility]) 2001  . Obesity   . Peripheral neuropathy   . Pneumonia    history of-last time about 14 yrs ago  . PONV (postoperative nausea and vomiting)    after knee surgery 25 yrs ago b/p stayed elevated for a while  . Presence of permanent cardiac pacemaker   . Shortness of breath dyspnea    with exertion  . Sleep apnea    uses CPAP nightly  . Ventricular tachycardia ([place])    s/p RFCA PVCs 2013     Social History   Socioeconomic History  . Marital status: Married    Spouse name: Not on file  . Number of children: Not on file  . Years of education: Not on file  . Highest education level: Not  on file  Occupational History  . Not on file  Social Needs  . Financial resource strain: Not on file  . Food insecurity    Worry: Not on file    Inability: Not on file  . Transportation needs    Medical: Not on file    Non-medical: Not on file  Tobacco Use  . Smoking status: Former Smoker    Packs/day: 0.25    Years: 25.00    Pack years: 6.25    Types: Cigarettes    Quit date: 05/19/2018    Years since quitting: 1.1  . Smokeless tobacco: Never Used  Substance and Sexual Activity  . Alcohol use: No    Alcohol/week: 0.0 - 1.0 standard drinks  . Drug use: No  . Sexual activity: Not Currently  Lifestyle  . Physical activity    Days per  week: Not on file    Minutes per session: Not on file  . Stress: Not on file  Relationships  . Social Herbalist on phone: Not on file    Gets together: Not on file    Attends religious service: Not on file    Active member of club or organization: Not on file    Attends meetings of clubs or organizations: Not on file    Relationship status: Not on file  . Intimate partner violence    Fear of current or ex partner: Not on file    Emotionally abused: Not on file    Physically abused: Not on file    Forced sexual activity: Not on file  Other Topics Concern  . Not on file  Social History Narrative   Married.   Moved from [place].   Disabled.    Past Surgical History:  Procedure Laterality Date  . BACK SURGERY    . CARDIAC CATHETERIZATION    . CARDIAC CATHETERIZATION Left 05/10/2016   Procedure: Left Heart Cath and Coronary Angiography;  Surgeon: Minna Merritts, MD;  Location: [facility];  Service: Cardiovascular;  Laterality: Left;  . CARDIAC DEFIBRILLATOR PLACEMENT  10/16/2007   ICD Model number 2207-36 serial number [id number]  . CARDIAC ELECTROPHYSIOLOGY STUDY AND ABLATION    . CHOLECYSTECTOMY  2001  . COLONOSCOPY    . COLONOSCOPY WITH PROPOFOL N/A 08/28/2018   Procedure: COLONOSCOPY WITH PROPOFOL;  Surgeon: Jonathon Bellows, MD;  [facility];  Service: Gastroenterology;  Laterality: N/A;  . CORONARY ANGIOPLASTY WITH STENT PLACEMENT     7 stents  . CORONARY ARTERY BYPASS GRAFT N/A 05/24/2016   Procedure: CORONARY ARTERY BYPASS GRAFTING (CABG) x four , using left internal mammary artery and left leg greater saphenous vein harvested endoscopically;  Surgeon: Gaye Pollack, MD;  Location: [facility];  Service: Open Heart Surgery;  Laterality: N/A;  . EP IMPLANTABLE DEVICE N/A 06/16/2015   Procedure: ICD Generator Changeout;  Surgeon: Deboraha Sprang, MD;  Location: [facility];  Service: Cardiovascular;  Laterality: N/A;  . ESOPHAGOGASTRODUODENOSCOPY     . INSERT / REPLACE / REMOVE PACEMAKER    . KNEE SURGERY     bilateral knee   . [name] WITHOUT CARDIOVERSION N/A 05/24/2016   Procedure: TRANSESOPHAGEAL ECHOCARDIOGRAM ([name]);  Surgeon: Gaye Pollack, MD;  Location: [facility];  Service: Open Heart Surgery;  Laterality: N/A;  . VASECTOMY      Family History  Problem Relation Age of Onset  . Heart attack Mother 73  . Hypertension Mother   . Heart attack Father 66  .  Hypertension Father   . Hypertension Maternal Uncle   . Heart disease Maternal Uncle   . Heart disease Maternal Grandmother   . Stroke Maternal Grandmother   . Diabetes Neg Hx     Allergies  Allergen Reactions  . Morphine And Related Nausea And Vomiting    Morphine only  . Naproxen Other (See Comments)    Causes hyperactivity    Current Outpatient Medications on File Prior to Visit  Medication Sig Dispense Refill  . aspirin EC 81 MG tablet Take 81 mg by mouth daily.  90 tablet   . carvedilol (COREG) 25 MG tablet Take 25 mg by mouth 2 (two) times daily with a meal.    . CHANTIX CONTINUING MONTH PAK 1 MG tablet TAKE 1 TABLET BY MOUTH TWICE A DAY 60 tablet 2  . clopidogrel (PLAVIX) 75 MG tablet TAKE ONE TABLET BY MOUTH DAILY 90 tablet 1  . empagliflozin (JARDIANCE) 25 MG TABS tablet Take 25 mg by mouth daily.    Marland Kitchen ezetimibe (ZETIA) 10 MG tablet Take 1 tablet (10 mg total) by mouth daily. 90 tablet 3  . furosemide (LASIX) 40 MG tablet TAKE TWO TABLETS BY MOUTH EVERY MORNING AND TAKE ONE TABLET BY MOUTH EVERY EVENING, Take extra in the afternoon as needed 360 tablet 3  . gemfibrozil (LOPID) 600 MG tablet TAKE ONE TABLET BY MOUTH DAILY 90 tablet 0  . ibuprofen (ADVIL,MOTRIN) 200 MG tablet Take 600 mg by mouth every 6 (six) hours as needed.    . insulin regular human CONCENTRATED (HUMULIN R) 500 UNIT/ML injection For use in pump, for a total of 130 units/day (Patient taking differently: For use in pump, for a total of 170 units/day) 20 mL 11  . rosuvastatin (CRESTOR) 20 MG tablet  Take 1 tablet (20 mg total) by mouth at bedtime. For cholesterol. 90 tablet 0  . sacubitril-valsartan (ENTRESTO) 97-103 MG Take 1 tablet by mouth 2 (two) times daily.    . sertraline (ZOLOFT) 100 MG tablet TAKE TWO TABLETS BY MOUTH DAILY 180 tablet 1  . spironolactone (ALDACTONE) 50 MG tablet Take 50 mg by mouth daily.    . traZODone (DESYREL) 100 MG tablet TAKE TWO TABLETS BY MOUTH EVERY NIGHT AT BEDTIME 180 tablet 0   No current facility-administered medications on file prior to visit.     BP (!) 108/58   Pulse 71   Temp (!) 96.8 F (36 C) (Temporal)   Ht 6' (1.829 m)   Wt 293 lb 8 oz (133.1 kg)   SpO2 97%   BMI 39.81 kg/m    Objective:   Physical Exam  Constitutional: He is oriented to person, place, and time. He appears well-nourished.  HENT:  Right Ear: Tympanic membrane and ear canal normal.  Left Ear: Tympanic membrane and ear canal normal.  Mouth/Throat: Oropharynx is clear and moist.  Eyes: Pupils are equal, round, and reactive to light. EOM are normal.  Neck: Neck supple.  Cardiovascular: Normal rate and regular rhythm.  Respiratory: Effort normal and breath sounds normal.  GI: Soft. Bowel sounds are normal. There is no abdominal tenderness.  Musculoskeletal:     Comments: General decrease in ROM to lower back  Neurological: He is alert and oriented to person, place, and time.  Skin: Skin is warm and dry.  Psychiatric: He has a normal mood and affect.           Assessment & Plan:

## 2019-07-23 NOTE — Patient Instructions (Signed)
Schedule a visit with Dr. Lorelei Pont regarding your achilles tendon pain.  Start exercising. You should be getting 150 minutes of exercise weekly.  Continue to work on weight loss through [employer], congratulations!  Follow up with cardiology and endocrinology.  I'll be in touch regarding changing your Zoloft.  It was a pleasure to see you today!   Preventive Care 58-58 Years Old, Male Preventive care refers to lifestyle choices and visits with your health care provider that can promote health and wellness. This includes:  A yearly physical exam. This is also called an annual well check.  Regular dental and eye exams.  Immunizations.  Screening for certain conditions.  Healthy lifestyle choices, such as eating a healthy diet, getting regular exercise, not using drugs or products that contain nicotine and tobacco, and limiting alcohol use. What can I expect for my preventive care visit? Physical exam Your health care provider will check:  Height and weight. These may be used to calculate body mass index (BMI), which is a measurement that tells if you are at a healthy weight.  Heart rate and blood pressure.  Your skin for abnormal spots. Counseling Your health care provider may ask you questions about:  Alcohol, tobacco, and drug use.  Emotional well-being.  Home and relationship well-being.  Sexual activity.  Eating habits.  Work and work Statistician. What immunizations do I need?  Influenza (flu) vaccine  This is recommended every year. Tetanus, diphtheria, and pertussis (Tdap) vaccine  You may need a Td booster every 10 years. Varicella (chickenpox) vaccine  You may need this vaccine if you have not already been vaccinated. Zoster (shingles) vaccine  You may need this after age 63. Measles, mumps, and rubella (MMR) vaccine  You may need at least one dose of MMR if you were born in 1957 or later. You may also need a second dose. Pneumococcal  conjugate (PCV13) vaccine  You may need this if you have certain conditions and were not previously vaccinated. Pneumococcal polysaccharide (PPSV23) vaccine  You may need one or two doses if you smoke cigarettes or if you have certain conditions. Meningococcal conjugate (MenACWY) vaccine  You may need this if you have certain conditions. Hepatitis A vaccine  You may need this if you have certain conditions or if you travel or work in places where you may be exposed to hepatitis A. Hepatitis B vaccine  You may need this if you have certain conditions or if you travel or work in places where you may be exposed to hepatitis B. Haemophilus influenzae type b (Hib) vaccine  You may need this if you have certain risk factors. Human papillomavirus (HPV) vaccine  If recommended by your health care provider, you may need three doses over 6 months. You may receive vaccines as individual doses or as more than one vaccine together in one shot (combination vaccines). Talk with your health care provider about the risks and benefits of combination vaccines. What tests do I need? Blood tests  Lipid and cholesterol levels. These may be checked every 5 years, or more frequently if you are over 58 years old.  Hepatitis C test.  Hepatitis B test. Screening  Lung cancer screening. You may have this screening every year starting at age 58 if you have a 30-pack-year history of smoking and currently smoke or have quit within the past 15 years.  Prostate cancer screening. Recommendations will vary depending on your family history and other risks.  Colorectal cancer screening. All adults should have  this screening starting at age 58 and continuing until age 58. Your health care provider may recommend screening at age 58 if you are at increased risk. You will have tests every 1-10 years, depending on your results and the type of screening test.  Diabetes screening. This is done by checking your blood sugar  (glucose) after you have not eaten for a while (fasting). You may have this done every 1-3 years.  Sexually transmitted disease (STD) testing. Follow these instructions at home: Eating and drinking  Eat a diet that includes fresh fruits and vegetables, whole grains, lean protein, and low-fat dairy products.  Take vitamin and mineral supplements as recommended by your health care provider.  Do not drink alcohol if your health care provider tells you not to drink.  If you drink alcohol: ? Limit how much you have to 0-2 drinks a day. ? Be aware of how much alcohol is in your drink. In the [place] one drink equals one 12 oz bottle of beer (355 mL), one 5 oz glass of wine (148 mL), or one 1 oz glass of hard liquor (44 mL). Lifestyle  Take daily care of your teeth and gums.  Stay active. Exercise for at least 30 minutes on 5 or more days each week.  Do not use any products that contain nicotine or tobacco, such as cigarettes, e-cigarettes, and chewing tobacco. If you need help quitting, ask your health care provider.  If you are sexually active, practice safe sex. Use a condom or other form of protection to prevent STIs (sexually transmitted infections).  Talk with your health care provider about taking a low-dose aspirin every day starting at age 58 What's next?  Go to your health care provider once a year for a well check visit.  Ask your health care provider how often you should have your eyes and teeth checked.  Stay up to date on all vaccines. This information is not intended to replace advice given to you by your health care provider. Make sure you discuss any questions you have with your health care provider. Document Released: 09/30/2015 Document Revised: 08/28/2018 Document Reviewed: 08/28/2018 Elsevier Patient Education  2020 [employer].

## 2019-07-23 NOTE — Assessment & Plan Note (Addendum)
Immunizations UTD. PSA UTD. Colonoscopy UTD. Commended him on lifestyle changes, encouraged exercise. Exam today stable. Labs from [facility] reviewed.

## 2019-07-23 NOTE — Assessment & Plan Note (Signed)
Compliant to CPAP, continue same. 

## 2019-07-23 NOTE — Assessment & Plan Note (Signed)
LDL at goal based off of recent labs. Continue current regimen.

## 2019-07-23 NOTE — Assessment & Plan Note (Signed)
Stable in the office today, continue current regimen. BMP reviewed from [facility] [place].

## 2019-07-23 NOTE — Assessment & Plan Note (Signed)
LDL at goal on recent labs. Continue clopidogrel, lipid management.

## 2019-07-23 NOTE — Assessment & Plan Note (Signed)
Chronic, not currently managed on treatment.  Continue to monitor.

## 2019-07-23 NOTE — Assessment & Plan Note (Signed)
Recent A1C of 8.6 from labs through [facility]. He did join weight watchers 6 weeks ago, has lost about 10 pounds total.  Following with endocrinology, continue current prescribed regimen.

## 2019-07-23 NOTE — Assessment & Plan Note (Signed)
Following with EP, due for remote pacer check later this month.

## 2019-07-24 DIAGNOSIS — F411 Generalized anxiety disorder: Secondary | ICD-10-CM

## 2019-07-24 DIAGNOSIS — F331 Major depressive disorder, recurrent, moderate: Secondary | ICD-10-CM

## 2019-07-24 MED ORDER — DULOXETINE HCL 30 MG PO CPEP: ORAL_CAPSULE | ORAL | 1 refills | Status: DC

## 2019-09-02 ENCOUNTER — Other Ambulatory Visit: Payer: Self-pay | Admitting: Primary Care

## 2019-09-02 ENCOUNTER — Other Ambulatory Visit: Payer: Self-pay | Admitting: Cardiovascular Disease

## 2019-09-02 ENCOUNTER — Other Ambulatory Visit: Payer: Self-pay

## 2019-09-02 DIAGNOSIS — E7800 Pure hypercholesterolemia, unspecified: Secondary | ICD-10-CM

## 2019-09-02 MED ORDER — FUROSEMIDE 40 MG PO TABS: ORAL_TABLET | ORAL | 0 refills | Status: DC

## 2019-09-28 ENCOUNTER — Other Ambulatory Visit: Payer: Self-pay | Admitting: Primary Care

## 2019-09-28 DIAGNOSIS — F411 Generalized anxiety disorder: Secondary | ICD-10-CM

## 2019-09-28 DIAGNOSIS — F331 Major depressive disorder, recurrent, moderate: Secondary | ICD-10-CM

## 2019-09-29 NOTE — Telephone Encounter (Signed)
See my chart message

## 2019-09-29 NOTE — Telephone Encounter (Signed)
Last prescribed on 07/24/2019 . Last appointment on 07/23/2019. Next future appointment on 07/25/2020

## 2019-10-06 ENCOUNTER — Ambulatory Visit: Payer: BLUE CROSS/BLUE SHIELD | Admitting: Cardiovascular Disease

## 2019-10-14 ENCOUNTER — Other Ambulatory Visit: Payer: Self-pay

## 2019-10-14 ENCOUNTER — Ambulatory Visit: Payer: 59 | Admitting: Primary Care

## 2019-10-14 VITALS — BP 104/60 | HR 80 | Temp 96.8°F | Ht 72.0 in | Wt 284.2 lb

## 2019-10-14 DIAGNOSIS — F411 Generalized anxiety disorder: Secondary | ICD-10-CM | POA: Diagnosis not present

## 2019-10-14 DIAGNOSIS — N4889 Other specified disorders of penis: Secondary | ICD-10-CM | POA: Diagnosis not present

## 2019-10-14 DIAGNOSIS — F331 Major depressive disorder, recurrent, moderate: Secondary | ICD-10-CM

## 2019-10-14 LAB — POC URINALSYSI DIPSTICK (AUTOMATED)
Bilirubin, UA: NEGATIVE
Blood, UA: NEGATIVE
Glucose, UA: POSITIVE — AB
Ketones, UA: NEGATIVE
Leukocytes, UA: NEGATIVE
Nitrite, UA: NEGATIVE
Protein, UA: NEGATIVE
Spec Grav, UA: 1.015 (ref 1.010–1.025)
Urobilinogen, UA: 0.2 E.U./dL
pH, UA: 6 (ref 5.0–8.0)

## 2019-10-14 MED ORDER — FLUCONAZOLE 150 MG PO TABS: 150.0000 mg | ORAL_TABLET | Freq: Once | ORAL | 0 refills | Status: AC

## 2019-10-14 MED ORDER — HYDROXYZINE HCL 25 MG PO TABS: 25.0000 mg | ORAL_TABLET | Freq: Two times a day (BID) | ORAL | 0 refills | Status: DC | PRN

## 2019-10-14 MED ORDER — ESCITALOPRAM OXALATE 20 MG PO TABS: 20.0000 mg | ORAL_TABLET | Freq: Every day | ORAL | 0 refills | Status: DC

## 2019-10-14 NOTE — Patient Instructions (Addendum)
We've increased the dose of your Lexapro to 20 mg. I sent a new prescription to your pharmacy.  You can try taking hydroxyzine 25 mg twice daily as needed for anxiety. This may cause drowsiness.   Either your high blood sugars or Jardiance medication for diabetes are contributing to your skin irritation. I recommend you hold the Jardiance for now to see if symptoms improve.   Take the fluconazole tablet once for irritation.   Please schedule a follow up appointment in 1 month.  It was a pleasure to see you today!

## 2019-10-14 NOTE — Assessment & Plan Note (Signed)
Increased symptoms that are partially secondary to his friend visiting from out of town. He does anticipate his friend leaving within a few weeks.  Regardless, we still need to adjust treatment as his symptoms were uncontrolled prior to his friend's visit.  Increase Lexapro to 20 mg. Add hydroxyzine 25 mg BID PRN, drowsiness precautions provided.   Follow up in 4 weeks.

## 2019-10-14 NOTE — Progress Notes (Signed)
Subjective:    Patient ID: Brian Williams, male    DOB: December 18, 1960, 59 y.o.   MRN: [id number]  HPI  This visit occurred during the SARS-CoV-2 public health emergency.  Safety protocols were in place, including screening questions prior to the visit, additional usage of staff PPE, and extensive cleaning of exam room while observing appropriate contact time as indicated for disinfecting solutions.   Brian Williams is a 59 year old male with a complex medical history including hypertension, cardiomyopathy, CHF, unstable angina, CAD, ICD defibrillator, type 2 diabetes, OSA, chronic back pain, anxiety, depression who presents today with a chief complaint of anxiety. He would also like to discuss groin itching.  1) Anxiety and Depression: He was last evaluated in early November 2020, reports of uncontrolled anxiety and depression on Zoloft 200 mg and Trazodone. GAD 7 score of 12 and PHQ 9 score of 15. Given history of chronic pain we weaned him from Zoloft and initiated Cymbalta.   He contacted [place] in mid January 2021 with reports of no improvement since the switch to Cymbalta so we weaned him off and initiated Lexapro 10 mg. Several days ago he endorsed continued anxiety so we increased his Lexapro to 20 mg.   Today he is taking Lexapro 10 mg. He endorses symptoms increased anxiety since a friend has moved into his home. He feels like he's paranoid, "I want to jump out of my skin", irritable/easily angered. He's been avoiding this friend and his wife as he doesn't enjoy the friends company. If his friend left then he would feel slightly better but still feels as though his medication regimen needs alteration.  He denies SI/HI. He's doing well on Trazodone overall.   2) Penile Itching: Chronic for three months and located to the entire penis, skin only. He denies penile discharge, pain, difficulty urinating, hematuria. He's never seen a rash. He's tried Lotrimin lotion OTC for which he applies at night with  temporary improvement until the afternoon the following day. He sweats to the groin region some days. His blood sugars have been decently controlled over the last several months up until one week ago as he's not been able to use his insulin pump. He's been injecting insulin. He is on Jardiance daily for diabetes.   Review of Systems  Respiratory: Negative for shortness of breath.   Cardiovascular: Negative for chest pain.  Neurological: Negative for dizziness and headaches.  Psychiatric/Behavioral: Negative for sleep disturbance. The patient is nervous/anxious.        See HPI       Past Medical History:  Diagnosis Date  . AICD (automatic cardioverter/defibrillator) present   . Anxiety    takes Xanax as needed  . Arthritis    back,knees,right shoulder  . Back pain   . Cardiomyopathy ([place])   . CHF (congestive heart failure) (HCC)    takes Lasix and Aldactone daily  . Coronary artery disease    takes Plavix daily  . Depression    takes Zoloft daily  . Diabetes mellitus without complication (HCC)    Humulin R and Farxiga daily.Fasting blood sugar runs 140  . GERD (gastroesophageal reflux disease)   . Headache   . History of bronchitis   . History of colon polyps    benign  . History of hiatal hernia   . Hyperlipidemia    takes Fenofibrate,Crestor, and Zetia daily  . Hypertension    takes Entresto and Coreg daily  . MI (myocardial infarction) ([place]) 2001  .  Obesity   . Peripheral neuropathy   . Pneumonia    history of-last time about 14 yrs ago  . PONV (postoperative nausea and vomiting)    after knee surgery 25 yrs ago b/p stayed elevated for a while  . Presence of permanent cardiac pacemaker   . Shortness of breath dyspnea    with exertion  . Sleep apnea    uses CPAP nightly  . Ventricular tachycardia ([place])    s/p RFCA PVCs 2013     Social History   Socioeconomic History  . Marital status: Married    Spouse name: Not on file  . Number of children: Not on file    . Years of education: Not on file  . Highest education level: Not on file  Occupational History  . Not on file  Tobacco Use  . Smoking status: Former Smoker    Packs/day: 0.25    Years: 25.00    Pack years: 6.25    Types: Cigarettes    Quit date: 05/19/2018    Years since quitting: 1.4  . Smokeless tobacco: Never Used  Substance and Sexual Activity  . Alcohol use: No    Alcohol/week: 0.0 - 1.0 standard drinks  . Drug use: No  . Sexual activity: Not Currently  Other Topics Concern  . Not on file  Social History Narrative   Married.   Moved from [place].   Disabled.   Social Determinants of Health   Financial Resource Strain:   . Difficulty of Paying Living Expenses: Not on file  Food Insecurity:   . Worried About Charity fundraiser in the Last Year: Not on file  . Ran Out of Food in the Last Year: Not on file  Transportation Needs:   . Lack of Transportation (Medical): Not on file  . Lack of Transportation (Non-Medical): Not on file  Physical Activity:   . Days of Exercise per Week: Not on file  . Minutes of Exercise per Session: Not on file  Stress:   . Feeling of Stress : Not on file  Social Connections:   . Frequency of Communication with Friends and Family: Not on file  . Frequency of Social Gatherings with Friends and Family: Not on file  . Attends Religious Services: Not on file  . Active Member of Clubs or Organizations: Not on file  . Attends Archivist Meetings: Not on file  . Marital Status: Not on file  Intimate Partner Violence:   . Fear of Current or Ex-Partner: Not on file  . Emotionally Abused: Not on file  . Physically Abused: Not on file  . Sexually Abused: Not on file    Past Surgical History:  Procedure Laterality Date  . BACK SURGERY    . CARDIAC CATHETERIZATION    . CARDIAC CATHETERIZATION Left 05/10/2016   Procedure: Left Heart Cath and Coronary Angiography;  Surgeon: Minna Merritts, MD;  Location: [facility];   Service: Cardiovascular;  Laterality: Left;  . CARDIAC DEFIBRILLATOR PLACEMENT  10/16/2007   ICD Model number 2207-36 serial number [id number]  . CARDIAC ELECTROPHYSIOLOGY STUDY AND ABLATION    . CHOLECYSTECTOMY  2001  . COLONOSCOPY    . COLONOSCOPY WITH PROPOFOL N/A 08/28/2018   Procedure: COLONOSCOPY WITH PROPOFOL;  Surgeon: Jonathon Bellows, MD;  Location: [facility];  Service: Gastroenterology;  Laterality: N/A;  . CORONARY ANGIOPLASTY WITH STENT PLACEMENT     7 stents  . CORONARY ARTERY BYPASS GRAFT N/A 05/24/2016   Procedure: CORONARY  ARTERY BYPASS GRAFTING (CABG) x four , using left internal mammary artery and left leg greater saphenous vein harvested endoscopically;  Surgeon: Gaye Pollack, MD;  Location: [facility];  Service: Open Heart Surgery;  Laterality: N/A;  . EP IMPLANTABLE DEVICE N/A 06/16/2015   Procedure: ICD Generator Changeout;  Surgeon: Deboraha Sprang, MD;  Location: [place] [facility];  Service: Cardiovascular;  Laterality: N/A;  . ESOPHAGOGASTRODUODENOSCOPY    . INSERT / REPLACE / REMOVE PACEMAKER    . KNEE SURGERY     bilateral knee   . [name] WITHOUT CARDIOVERSION N/A 05/24/2016   Procedure: TRANSESOPHAGEAL ECHOCARDIOGRAM ([name]);  Surgeon: Gaye Pollack, MD;  Location: [place] [facility];  Service: Open Heart Surgery;  Laterality: N/A;  . VASECTOMY      Family History  Problem Relation Age of Onset  . Heart attack Mother 50  . Hypertension Mother   . Heart attack Father 79  . Hypertension Father   . Hypertension Maternal Uncle   . Heart disease Maternal Uncle   . Heart disease Maternal Grandmother   . Stroke Maternal Grandmother   . Diabetes Neg Hx     Allergies  Allergen Reactions  . Morphine And Related Nausea And Vomiting    Morphine only  . Naproxen Other (See Comments)    Causes hyperactivity    Current Outpatient Medications on File Prior to Visit  Medication Sig Dispense Refill  . aspirin EC 81 MG tablet Take 81 mg by mouth daily.  90 tablet   . carvedilol (COREG) 25  MG tablet Take 25 mg by mouth 2 (two) times daily with a meal.    . CHANTIX CONTINUING MONTH PAK 1 MG tablet TAKE 1 TABLET BY MOUTH TWICE A DAY 60 tablet 2  . clopidogrel (PLAVIX) 75 MG tablet TAKE ONE TABLET BY MOUTH DAILY 90 tablet 1  . empagliflozin (JARDIANCE) 25 MG TABS tablet Take 25 mg by mouth daily.    Marland Kitchen ezetimibe (ZETIA) 10 MG tablet Take 1 tablet (10 mg total) by mouth daily. 90 tablet 3  . furosemide (LASIX) 40 MG tablet TAKE TWO TABLETS EVERY MORNING AND TAKE ONE TABLET EVERY EVENING. MAY TAKE 1 TABLET IN THE AFTERNOON AS NEEDED. NEEDS APPOINTMENT 120 tablet 0  . gemfibrozil (LOPID) 600 MG tablet TAKE ONE TABLET BY MOUTH DAILY 90 tablet 0  . ibuprofen (ADVIL,MOTRIN) 200 MG tablet Take 600 mg by mouth every 6 (six) hours as needed.    . insulin regular human CONCENTRATED (HUMULIN R) 500 UNIT/ML injection For use in pump, for a total of 130 units/day (Patient taking differently: For use in pump, for a total of 170 units/day) 20 mL 11  . rosuvastatin (CRESTOR) 20 MG tablet TAKE ONE TABLET BY MOUTH EVERY NIGHT AT BEDTIME FOR CHOLESTEROL 90 tablet 2  . sacubitril-valsartan (ENTRESTO) 97-103 MG Take 1 tablet by mouth 2 (two) times daily.    Marland Kitchen spironolactone (ALDACTONE) 50 MG tablet Take 50 mg by mouth daily.    . traZODone (DESYREL) 100 MG tablet TAKE TWO TABLETS BY MOUTH EVERY NIGHT AT BEDTIME 180 tablet 2  . [DISCONTINUED] DULoxetine (CYMBALTA) 30 MG capsule Take 1 capsule by mouth for 7 days, then 2 capsules thereafter for depression. 60 capsule 1   No current facility-administered medications on file prior to visit.    BP 104/60   Pulse 80   Temp (!) 96.8 F (36 C) (Temporal)   Ht 6' (1.829 m)   Wt 284 lb 4 oz (128.9 kg)  SpO2 97%   BMI 38.55 kg/m    Objective:   Physical Exam  Constitutional: He appears well-nourished.  Cardiovascular: Normal rate and regular rhythm.  Respiratory: Effort normal and breath sounds normal.  Genitourinary:    Testes normal.  Circumcised.  Penile erythema present. No discharge found.    Genitourinary Comments: Irritation with whitish substance around head of penis. Obvious inflammation/irritation. Chaperone present.   Musculoskeletal:     Cervical back: Neck supple.  Skin: Skin is warm and dry.  Psychiatric: He has a normal mood and affect.           Assessment & Plan:

## 2019-10-14 NOTE — Assessment & Plan Note (Addendum)
Chronic for three months. Exam today represents candidal infection that is likely either secondary to elevated glucose or a side effect from Jardiance.  Suspect more side effect from Jardiance since he endorses that his glucose has been decently controlled up until one week ago.  Rx for fluconazole sent to pharmacy. I encouraged him to speak with his endocrinologist regarding Jardiance and chronic candidal infection.   UA today with 3+ glucose, otherwise unremarkable.

## 2019-10-18 DIAGNOSIS — F411 Generalized anxiety disorder: Secondary | ICD-10-CM

## 2019-10-26 MED ORDER — HYDROXYZINE HCL 25 MG PO TABS: 25.0000 mg | ORAL_TABLET | Freq: Two times a day (BID) | ORAL | 0 refills | Status: DC | PRN

## 2019-11-07 ENCOUNTER — Other Ambulatory Visit: Payer: Self-pay | Admitting: Primary Care

## 2019-11-07 DIAGNOSIS — E785 Hyperlipidemia, unspecified: Secondary | ICD-10-CM

## 2019-11-11 ENCOUNTER — Other Ambulatory Visit: Payer: Self-pay | Admitting: Primary Care

## 2019-11-11 DIAGNOSIS — F331 Major depressive disorder, recurrent, moderate: Secondary | ICD-10-CM

## 2019-11-11 DIAGNOSIS — F411 Generalized anxiety disorder: Secondary | ICD-10-CM

## 2019-11-12 ENCOUNTER — Other Ambulatory Visit: Payer: Self-pay

## 2019-11-12 DIAGNOSIS — F411 Generalized anxiety disorder: Secondary | ICD-10-CM

## 2019-11-12 DIAGNOSIS — F331 Major depressive disorder, recurrent, moderate: Secondary | ICD-10-CM

## 2019-11-16 ENCOUNTER — Ambulatory Visit: Payer: 59 | Admitting: Primary Care

## 2019-11-17 ENCOUNTER — Ambulatory Visit (INDEPENDENT_AMBULATORY_CARE_PROVIDER_SITE_OTHER): Payer: 59 | Admitting: Primary Care

## 2019-11-17 ENCOUNTER — Other Ambulatory Visit: Payer: Self-pay

## 2019-11-17 ENCOUNTER — Encounter: Payer: Self-pay | Admitting: Primary Care

## 2019-11-17 VITALS — BP 110/60 | HR 73 | Temp 97.2°F | Ht 72.0 in | Wt 294.5 lb

## 2019-11-17 DIAGNOSIS — F331 Major depressive disorder, recurrent, moderate: Secondary | ICD-10-CM | POA: Diagnosis not present

## 2019-11-17 DIAGNOSIS — M545 Low back pain, unspecified: Secondary | ICD-10-CM

## 2019-11-17 DIAGNOSIS — N4889 Other specified disorders of penis: Secondary | ICD-10-CM

## 2019-11-17 DIAGNOSIS — F411 Generalized anxiety disorder: Secondary | ICD-10-CM | POA: Diagnosis not present

## 2019-11-17 DIAGNOSIS — G8929 Other chronic pain: Secondary | ICD-10-CM

## 2019-11-17 MED ORDER — CYCLOBENZAPRINE HCL 10 MG PO TABS: 10.0000 mg | ORAL_TABLET | Freq: Three times a day (TID) | ORAL | 0 refills | Status: DC | PRN
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
## 2019-11-17 NOTE — Assessment & Plan Note (Signed)
Improved with lexapro 20 mg, hydroxyzine causing drowsiness.  Continue Lexapro 20 mg, stop hydroxyzine. He will update with any changes.

## 2019-11-17 NOTE — Assessment & Plan Note (Signed)
Resolved after stopping Jardiance, returned when he resumed. He will discontinue and follow up with endocrinology.
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
## 2019-11-17 NOTE — Assessment & Plan Note (Signed)
Improved on Lexapro 20 mg, hydroxyzine causing drowsiness.  Continue Lexapro, stop hydroxyzine.  He will update with any changes.

## 2019-11-17 NOTE — Patient Instructions (Signed)
You can take the cyclobenzaprine every 8 hours as needed for muscle spasms/body aches. This may cause drowsiness.  Remain off of Jardiance, follow up with endocrinology.  Continue lexapro 20 mg. Stop hydroxyzine as this may be contributing to drowsiness.   It was a pleasure to see you today!

## 2019-12-09 ENCOUNTER — Other Ambulatory Visit: Payer: Self-pay | Admitting: Primary Care

## 2019-12-09 DIAGNOSIS — F411 Generalized anxiety disorder: Secondary | ICD-10-CM

## 2019-12-09 DIAGNOSIS — F331 Major depressive disorder, recurrent, moderate: Secondary | ICD-10-CM

## 2019-12-21 NOTE — Progress Notes (Signed)
Cardiology Office Note  Date:  12/22/2019   ID:  ORDIE BADGETT, DOB [birth year], MRN [id number]  PCP:  Pleas Koch, NP   Chief Complaint  Patient presents with  . office visit    12 month F/U; Meds verbally reviewed with patient.    HPI:  Mr. Halpin is a pleasant 59 year old gentleman with history of  coronary artery disease,  long history of smoking,  diabetes type 2 poorly controlled with hemoglobin A1c of 9, followed by endocrine/Dr. Gabriel Carina, cardiac catheterization 12 dating back to 2002,  ischemic cardiomyopathy,  echocardiogram March 2016 showing ejection fraction 20-25%,   sustained VT, VT ablation in [place] at [facility] sleep apnea, on CPAP, generator change in 2016, ICD in place,  previously seen by seen by [facility] cardiology heart failure/transplant team, [name].  EF 35% in 2017 Last cath 06/2019 who presents for routine follow-up of his coronary artery disease, CABG, September 2017  Labs reviewed HBA1C 10.3, does not see solum/endocrine Total chol 161, TG 773  Was on potassium , trouble swallow Potassium 3.2  Chronic fatigue, [facility] decided to stress test, then cath  Stress test:  Abnormal myocardial perfusion study - There is a extensive in size, severe defect involving the apical, apical inferior, mid inferior, basal inferior, apical septal, mid inferoseptal and basal inferoseptal segments. The apical, apical inferior, apical septal, mid inferior and mid inferoseptal segments are minimally reversible. The basal inferior and basal inferoseptal segments are fixed. This is consistent with scar with minimal peri-infarct ischemia. - During stress: Global systolic function is severely reduced. The ejection fraction calculated at 36%.  - Attenuation CT scan shows post CABG findings  Cath at 06/2019 at [facility] . There is Significant 3-vessel coronary artery disease including 60%  stenosis of distal LMCA, complete occlusion of the mLAD & mRCA and 80-90%  stenosis  of OM2 & OM3.  2. Patent LIMA-mLAD, VG-rPDA and VG-OM2. VG-D known to be occluded.  3. Normal right and left ventricular filling pressures.  4. Normal cardiac output.   Weight running high: 294 pounds   "living on chantrix" Not smoking  No significant leg swelling  EKG personally reviewed by myself on todays visit Shows normal sinus rhythm rate 79 bpm, old anterior MI, nonspecific ST ABN   Other past medical history reviewed  CABG By Dr. Cyndia Bent September 2017 Left internal mammary graft to the LAD SVG to diagonal, SVG to OM, SVG to PDA  Previous stress test results discussed with him in detail  showing no ischemia, old inferior wall scar He is on aspirin Plavix  EKG personally reviewed by myself on todays visit Shows normal sinus rhythm rate 75 bpm old anterior MI nonspecific ST-T wave abnormality  Other past medical history reviewed Previous cardiac catheterization results  cath on 05/10/2016 showing a 70% eccentric distal LM stenosis, 80% proximal LAD in-stent restenosis, 80% diagonal stenosis, and tandem 90% proximal and mid RCA stenoses with mild in-stent narrowing. The LVEF is visually 35-45%  Previously seen in [place] for his cardiac issues. History dates back to 2002 when he had distal RCA disease. Several catheterizations over the next several years for in-stent restenosis of the distal RCA. Later cath, stent placed to the LAD. Most recent catheterization appears to be April 2011 showing patent LAD and RCA stent.  Despite this, he has had a decline in his ejection fraction over the past several years requiring defibrillator for ventricular tachycardia. Arrhythmia seems to be controlled on nadolol (was previously on 80 mg twice  a day, decreased down to daily secondary to low blood pressure and fatigue)  only had one shock since the device was placed.   seen by [facility] cardiology heart failure/transplant team, [name]. No medication changes at that time.  Creatinine was elevated with BUN, improved on subsequent lab work.   Previous cholesterol per his notes July 2015 showing total cholesterol 185, HDL 19, triglycerides 800, this was on Crestor 40, fenofibrate  PMH:   has a past medical history of AICD (automatic cardioverter/defibrillator) present, Anxiety, Arthritis, Back pain, Cardiomyopathy ([place]), CHF (congestive heart failure) ([place]), Coronary artery disease, Depression, Diabetes mellitus without complication ([place]), GERD (gastroesophageal reflux disease), Headache, History of bronchitis, History of colon polyps, History of hiatal hernia, Hyperlipidemia, Hypertension, MI (myocardial infarction) ([place]) (2001), Obesity, Peripheral neuropathy, Pneumonia, PONV (postoperative nausea and vomiting), Presence of permanent cardiac pacemaker, Shortness of breath dyspnea, Sleep apnea, and Ventricular tachycardia ([place]).  PSH:    Past Surgical History:  Procedure Laterality Date  . BACK SURGERY    . CARDIAC CATHETERIZATION    . CARDIAC CATHETERIZATION Left 05/10/2016   Procedure: Left Heart Cath and Coronary Angiography;  Surgeon: Minna Merritts, MD;  Location: [place] CV [facility];  Service: Cardiovascular;  Laterality: Left;  . CARDIAC DEFIBRILLATOR PLACEMENT  10/16/2007   ICD Model number 2207-36 serial number [id number]  . CARDIAC ELECTROPHYSIOLOGY STUDY AND ABLATION    . CHOLECYSTECTOMY  2001  . COLONOSCOPY    . COLONOSCOPY WITH PROPOFOL N/A 08/28/2018   Procedure: COLONOSCOPY WITH PROPOFOL;  Surgeon: Jonathon Bellows, MD;  Location: [facility] ENDOSCOPY;  Service: Gastroenterology;  Laterality: N/A;  . CORONARY ANGIOPLASTY WITH STENT PLACEMENT     7 stents  . CORONARY ARTERY BYPASS GRAFT N/A 05/24/2016   Procedure: CORONARY ARTERY BYPASS GRAFTING (CABG) x four , using left internal mammary artery and left leg greater saphenous vein harvested endoscopically;  Surgeon: Gaye Pollack, MD;  Location: [place] OR;  Service: Open Heart Surgery;  Laterality: N/A;  . EP  IMPLANTABLE DEVICE N/A 06/16/2015   Procedure: ICD Generator Changeout;  Surgeon: Deboraha Sprang, MD;  Location: [place] CV LAB;  Service: Cardiovascular;  Laterality: N/A;  . ESOPHAGOGASTRODUODENOSCOPY    . INSERT / REPLACE / REMOVE PACEMAKER    . KNEE SURGERY     bilateral knee   . [name] WITHOUT CARDIOVERSION N/A 05/24/2016   Procedure: TRANSESOPHAGEAL ECHOCARDIOGRAM ([name]);  Surgeon: Gaye Pollack, MD;  Location: [place];  Service: Open Heart Surgery;  Laterality: N/A;  . VASECTOMY      Current Outpatient Medications  Medication Sig Dispense Refill  . aspirin EC 81 MG tablet Take 81 mg by mouth daily.  90 tablet   . carvedilol (COREG) 25 MG tablet Take 25 mg by mouth 2 (two) times daily with a meal.    . CHANTIX CONTINUING MONTH PAK 1 MG tablet TAKE 1 TABLET BY MOUTH TWICE A DAY 60 tablet 2  . clopidogrel (PLAVIX) 75 MG tablet TAKE ONE TABLET BY MOUTH DAILY 90 tablet 1  . dapagliflozin propanediol (FARXIGA) 10 MG TABS tablet Take 10 mg by mouth daily.    Marland Kitchen escitalopram (LEXAPRO) 20 MG tablet TAKE ONE TABLET BY MOUTH DAILY FOR ANXIETY AND DEPRESSION 30 tablet 2  . ezetimibe (ZETIA) 10 MG tablet Take 1 tablet (10 mg total) by mouth daily. 90 tablet 3  . furosemide (LASIX) 40 MG tablet TAKE TWO TABLETS EVERY MORNING AND TAKE ONE TABLET EVERY EVENING. MAY TAKE 1 TABLET IN THE AFTERNOON AS NEEDED. NEEDS  APPOINTMENT 120 tablet 0  . gemfibrozil (LOPID) 600 MG tablet TAKE ONE TABLET BY MOUTH DAILY 90 tablet 0  . hydrOXYzine (ATARAX/VISTARIL) 25 MG tablet Take 1 tablet (25 mg total) by mouth 2 (two) times daily as needed for anxiety. 180 tablet 0  . ibuprofen (ADVIL,MOTRIN) 200 MG tablet Take 600 mg by mouth every 6 (six) hours as needed.    . insulin regular human CONCENTRATED (HUMULIN R) 500 UNIT/ML injection For use in pump, for a total of 130 units/day (Patient taking differently: For use in pump, for a total of 170 units/day) 20 mL 11  . rosuvastatin (CRESTOR) 20 MG tablet TAKE ONE TABLET BY  MOUTH EVERY NIGHT AT BEDTIME FOR CHOLESTEROL 90 tablet 2  . sacubitril-valsartan (ENTRESTO) 97-103 MG Take 1 tablet by mouth 2 (two) times daily.    Marland Kitchen spironolactone (ALDACTONE) 50 MG tablet Take 50 mg by mouth daily.    . traZODone (DESYREL) 100 MG tablet TAKE TWO TABLETS BY MOUTH EVERY NIGHT AT BEDTIME 180 tablet 2  . cyclobenzaprine (FLEXERIL) 10 MG tablet Take 1 tablet (10 mg total) by mouth 3 (three) times daily as needed for muscle spasms. 30 tablet 0  . empagliflozin (JARDIANCE) 25 MG TABS tablet Take 25 mg by mouth daily.     No current facility-administered medications for this visit.     Allergies:   Morphine and related and Naproxen   Social History:  The patient  reports that he quit smoking about 19 months ago. His smoking use included cigarettes. He has a 6.25 pack-year smoking history. He has never used smokeless tobacco. He reports that he does not drink alcohol or use drugs.   Family History:   family history includes Heart attack (age of onset: 47) in his mother; Heart attack (age of onset: 64) in his father; Heart disease in his maternal grandmother and maternal uncle; Hypertension in his father, maternal uncle, and mother; Stroke in his maternal grandmother.    Review of Systems: Review of Systems  Constitutional: Negative.   HENT: Negative.   Respiratory: Positive for shortness of breath.   Cardiovascular: Negative.   Gastrointestinal: Negative.   Musculoskeletal: Negative.   Neurological: Negative.   Psychiatric/Behavioral: Negative.   All other systems reviewed and are negative.   PHYSICAL EXAM: VS:  BP 100/64 (BP Location: Left Arm, Patient Position: Sitting, Cuff Size: Normal)   Pulse 79   Ht 6' (1.829 m)   Wt 294 lb 2 oz (133.4 kg)   SpO2 96%   BMI 39.89 kg/m  , BMI Body mass index is 39.89 kg/m. Constitutional:  oriented to person, place, and time. No distress.  HENT:  Head: Grossly normal Eyes:  no discharge. No scleral icterus.  Neck: No JVD,  no carotid bruits  Cardiovascular: Regular rate and rhythm, no murmurs appreciated Pulmonary/Chest: Clear to auscultation bilaterally, no wheezes or rails Abdominal: Soft.  no distension.  no tenderness.  Musculoskeletal: Normal range of motion Neurological:  normal muscle tone. Coordination normal. No atrophy Skin: Skin warm and dry Psychiatric: normal affect, pleasant   Recent Labs: 01/30/2019: BUN 20; Creatinine, Ser 0.83; Hemoglobin 11.6; Magnesium 2.1; Platelets 134.0; Potassium 4.0; Sodium 139    Lipid Panel Lab Results  Component Value Date   CHOL 171 07/21/2018   HDL 27.40 (L) 07/21/2018   TRIG (H) 07/21/2018    952.0 Triglyceride is over 400; calculations on Lipids are invalid.      Wt Readings from Last 3 Encounters:  12/22/19 294 lb 2  oz (133.4 kg)  11/17/19 294 lb 8 oz (133.6 kg)  10/14/19 284 lb 4 oz (128.9 kg)     ASSESSMENT AND PLAN:  Congestive dilated cardiomyopathy ([place]) - Plan: EKG 12-Lead Continue meds Entresto, aldactone, coreg, lasix  Essential hypertension - Plan: EKG 12-Lead Low blood pressure,  Chronic fatigue  Chronic systolic heart failure ([place]) - Plan: EKG 12-Lead Chronic shortness of breath, unchanged Extra Lasix as needed Lasix 80/40 daily  Stable angina (HCC) Chronic chest symptoms, with shortness of breath symptoms recent cath reviewed, no intervention, occluded graft Needs to work on diabetes Cardiac rehab  Morbid obesity with BMI of 40.0-44.9, adult ([place]) We have encouraged continued exercise, careful diet management in an effort to lose weight.  Hx of CABG Chronic stable anginal symptoms Cardiac rehab as above  OSA on CPAP Recommended compliance with his CPAP  Pure hypercholesterolemia On Crestor and Zetia  Active smoker On Chantix   Total encounter time more than 25 minutes  Greater than 50% was spent in counseling and coordination of care with the patient  Disposition:   F/U  12 months Has follow-up with  numerous physicians   No orders of the defined types were placed in this encounter.    Signed, Esmond Plants, M.D., Ph.D. 12/22/2019  [place], [place]

## 2019-12-22 ENCOUNTER — Other Ambulatory Visit: Payer: Self-pay

## 2019-12-22 ENCOUNTER — Encounter: Payer: Self-pay | Admitting: Cardiovascular Disease

## 2019-12-22 ENCOUNTER — Ambulatory Visit (INDEPENDENT_AMBULATORY_CARE_PROVIDER_SITE_OTHER): Payer: Medicare Other | Admitting: Cardiovascular Disease

## 2019-12-22 VITALS — BP 100/64 | HR 79 | Ht 72.0 in | Wt 294.1 lb

## 2019-12-22 DIAGNOSIS — I5022 Chronic systolic (congestive) heart failure: Secondary | ICD-10-CM

## 2019-12-22 DIAGNOSIS — I420 Dilated cardiomyopathy: Secondary | ICD-10-CM | POA: Diagnosis not present

## 2019-12-22 DIAGNOSIS — E1165 Type 2 diabetes mellitus with hyperglycemia: Secondary | ICD-10-CM

## 2019-12-22 DIAGNOSIS — Z9989 Dependence on other enabling machines and devices: Secondary | ICD-10-CM

## 2019-12-22 DIAGNOSIS — E118 Type 2 diabetes mellitus with unspecified complications: Secondary | ICD-10-CM | POA: Diagnosis not present

## 2019-12-22 DIAGNOSIS — G4733 Obstructive sleep apnea (adult) (pediatric): Secondary | ICD-10-CM

## 2019-12-22 DIAGNOSIS — I25118 Atherosclerotic heart disease of native coronary artery with other forms of angina pectoris: Secondary | ICD-10-CM | POA: Diagnosis not present

## 2019-12-22 DIAGNOSIS — Z95810 Presence of automatic (implantable) cardiac defibrillator: Secondary | ICD-10-CM

## 2019-12-22 DIAGNOSIS — IMO0002 Reserved for concepts with insufficient information to code with codable children: Secondary | ICD-10-CM

## 2019-12-22 NOTE — Patient Instructions (Addendum)
Cardiac rehab at [facility], stable angina   Medication Instructions:  No changes  If you need a refill on your cardiac medications before your next appointment, please call your pharmacy.    Lab work: No new labs needed   If you have labs (blood work) drawn today and your tests are completely normal, you will receive your results only by: Marland Kitchen MyChart Message (if you have MyChart) OR . A paper copy in the mail If you have any lab test that is abnormal or we need to change your treatment, we will call you to review the results.   Testing/Procedures: No new testing needed   Follow-Up: At [facility], you and your health needs are our priority.  As part of our continuing mission to provide you with exceptional heart care, we have created designated Provider Care Teams.  These Care Teams include your primary Cardiologist (physician) and Advanced Practice Providers (APPs -  Physician Assistants and Nurse Practitioners) who all work together to provide you with the care you need, when you need it.  . You will need a follow up appointment in 6 months .  Marland Kitchen Providers on your designated Care Team:   . Murray Hodgkins, NP . Christell Faith, PA-C . Marrianne Mood, PA-C  Any Other Special Instructions Will Be Listed Below (If Applicable).  For educational health videos Log in to : [email or web address] Or : [email or web address], password : [email or web address]

## 2019-12-27 ENCOUNTER — Other Ambulatory Visit: Payer: Self-pay | Admitting: Cardiovascular Disease

## 2019-12-30 DIAGNOSIS — G4733 Obstructive sleep apnea (adult) (pediatric): Secondary | ICD-10-CM | POA: Diagnosis not present

## 2020-01-03 DIAGNOSIS — F331 Major depressive disorder, recurrent, moderate: Secondary | ICD-10-CM

## 2020-01-03 DIAGNOSIS — F411 Generalized anxiety disorder: Secondary | ICD-10-CM

## 2020-01-04 MED ORDER — ESCITALOPRAM OXALATE 20 MG PO TABS: ORAL_TABLET | ORAL | 1 refills | Status: DC

## 2020-01-13 ENCOUNTER — Other Ambulatory Visit: Payer: Self-pay | Admitting: Primary Care

## 2020-01-13 DIAGNOSIS — F331 Major depressive disorder, recurrent, moderate: Secondary | ICD-10-CM

## 2020-01-13 DIAGNOSIS — F411 Generalized anxiety disorder: Secondary | ICD-10-CM

## 2020-01-13 NOTE — Telephone Encounter (Signed)
Pt called in and said he was having severe leg pain to the point he couldn't put any weight on it. Dr. Lorelei Pont was full and didn't have anything until Monday. I put him in with you tomorrow @ 10:20 and let him know if he gets worse to please go to ER or Urgent Care after asking triage nurse. Just letting you know just in case you wanted to work him in today.

## 2020-01-13 NOTE — Telephone Encounter (Signed)
Noted, will evaluate. 

## 2020-01-14 ENCOUNTER — Other Ambulatory Visit: Payer: Self-pay

## 2020-01-14 ENCOUNTER — Encounter: Payer: Self-pay | Admitting: Internal Medicine

## 2020-01-14 ENCOUNTER — Ambulatory Visit: Payer: Medicare Other | Admitting: Primary Care

## 2020-01-14 ENCOUNTER — Ambulatory Visit (INDEPENDENT_AMBULATORY_CARE_PROVIDER_SITE_OTHER): Payer: Medicare Other | Admitting: Internal Medicine

## 2020-01-14 ENCOUNTER — Ambulatory Visit (INDEPENDENT_AMBULATORY_CARE_PROVIDER_SITE_OTHER)
Admission: RE | Admit: 2020-01-14 | Discharge: 2020-01-14 | Disposition: A | Discharge status: Home / Self Care | Payer: Medicare Other | Source: Ambulatory Visit | Attending: Internal Medicine | Admitting: Internal Medicine

## 2020-01-14 VITALS — BP 110/68 | HR 77 | Temp 97.0°F | Wt 294.0 lb

## 2020-01-14 DIAGNOSIS — M5442 Lumbago with sciatica, left side: Secondary | ICD-10-CM | POA: Diagnosis not present

## 2020-01-14 DIAGNOSIS — G8929 Other chronic pain: Secondary | ICD-10-CM

## 2020-01-14 DIAGNOSIS — M545 Low back pain: Secondary | ICD-10-CM | POA: Diagnosis not present

## 2020-01-14 MED ORDER — HYDROCODONE-ACETAMINOPHEN 5-325 MG PO TABS: 1.0000 | ORAL_TABLET | Freq: Four times a day (QID) | ORAL | 0 refills | Status: DC | PRN

## 2020-01-14 MED ORDER — PREDNISONE 10 MG PO TABS: ORAL_TABLET | ORAL | 0 refills | Status: DC
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
## 2020-01-14 NOTE — Patient Instructions (Signed)

## 2020-01-15 ENCOUNTER — Encounter: Payer: Medicare Other | Attending: Cardiovascular Disease | Admitting: *Deleted

## 2020-01-15 ENCOUNTER — Encounter: Payer: Self-pay | Admitting: *Deleted

## 2020-01-15 DIAGNOSIS — I208 Other forms of angina pectoris: Secondary | ICD-10-CM

## 2020-01-15 NOTE — Progress Notes (Signed)
Initial telephone orientation completed. Diagnosis can be found in [facility] 4/6. EP orientation scheduled for 5/3 at 3pm.

## 2020-01-18 ENCOUNTER — Encounter: Payer: Medicare Other | Attending: Cardiovascular Disease

## 2020-01-18 ENCOUNTER — Other Ambulatory Visit: Payer: Self-pay

## 2020-01-18 VITALS — Ht 72.0 in | Wt 292.2 lb

## 2020-01-18 DIAGNOSIS — E785 Hyperlipidemia, unspecified: Secondary | ICD-10-CM | POA: Insufficient documentation

## 2020-01-18 DIAGNOSIS — G4730 Sleep apnea, unspecified: Secondary | ICD-10-CM | POA: Diagnosis not present

## 2020-01-18 DIAGNOSIS — I252 Old myocardial infarction: Secondary | ICD-10-CM | POA: Insufficient documentation

## 2020-01-18 DIAGNOSIS — Z7902 Long term (current) use of antithrombotics/antiplatelets: Secondary | ICD-10-CM | POA: Diagnosis not present

## 2020-01-18 DIAGNOSIS — Z7901 Long term (current) use of anticoagulants: Secondary | ICD-10-CM | POA: Diagnosis not present

## 2020-01-18 DIAGNOSIS — Z7982 Long term (current) use of aspirin: Secondary | ICD-10-CM | POA: Insufficient documentation

## 2020-01-18 DIAGNOSIS — I208 Other forms of angina pectoris: Secondary | ICD-10-CM | POA: Diagnosis not present

## 2020-01-18 DIAGNOSIS — Z87891 Personal history of nicotine dependence: Secondary | ICD-10-CM | POA: Insufficient documentation

## 2020-01-18 DIAGNOSIS — I429 Cardiomyopathy, unspecified: Secondary | ICD-10-CM | POA: Diagnosis not present

## 2020-01-18 DIAGNOSIS — E119 Type 2 diabetes mellitus without complications: Secondary | ICD-10-CM | POA: Diagnosis not present

## 2020-01-18 DIAGNOSIS — I110 Hypertensive heart disease with heart failure: Secondary | ICD-10-CM | POA: Diagnosis not present

## 2020-01-18 DIAGNOSIS — Z79899 Other long term (current) drug therapy: Secondary | ICD-10-CM | POA: Insufficient documentation

## 2020-01-18 DIAGNOSIS — I509 Heart failure, unspecified: Secondary | ICD-10-CM | POA: Diagnosis not present

## 2020-01-18 DIAGNOSIS — F419 Anxiety disorder, unspecified: Secondary | ICD-10-CM | POA: Diagnosis not present

## 2020-01-18 DIAGNOSIS — Z794 Long term (current) use of insulin: Secondary | ICD-10-CM | POA: Diagnosis not present

## 2020-01-18 DIAGNOSIS — F329 Major depressive disorder, single episode, unspecified: Secondary | ICD-10-CM | POA: Insufficient documentation

## 2020-01-18 NOTE — Patient Instructions (Addendum)
Patient Instructions  Patient Details  Name: Brian Williams MRN: [id number] Date of Birth: 07-02-1961 Referring Provider:  Minna Merritts, MD  Below are your personal goals for exercise, nutrition, and risk factors. Our goal is to help you stay on track towards obtaining and maintaining these goals. We will be discussing your progress on these goals with you throughout the program.  Initial Exercise Prescription: Initial Exercise Prescription - 01/18/20 1600      Date of Initial Exercise RX and Referring Provider   Date  01/18/20    Referring Provider  Gollan      Treadmill   MPH  1.8    Grade  0.5    Minutes  15    METs  2.5      NuStep   Level  2    SPM  80    Minutes  15    METs  2.5      REL-XR   Level  2    Speed  50    Minutes  15    METs  2.5      Biostep-RELP   Level  2    SPM  50    Minutes  15    METs  2      Prescription Details   Frequency (times per week)  3    Duration  Progress to 30 minutes of continuous aerobic without signs/symptoms of physical distress      Intensity   THRR 40-80% of Max Heartrate  111-145    Ratings of Perceived Exertion  11-13    Perceived Dyspnea  0-4      Resistance Training   Training Prescription  Yes    Weight  3 lb    Reps  10-15       Exercise Goals: Frequency: Be able to perform aerobic exercise two to three times per week in program working toward 2-5 days per week of home exercise.  Intensity: Work with a perceived exertion of 11 (fairly light) - 15 (hard) while following your exercise prescription.  We will make changes to your prescription with you as you progress through the program.   Duration: Be able to do 30 to 45 minutes of continuous aerobic exercise in addition to a 5 minute warm-up and a 5 minute cool-down routine.   Nutrition Goals: Your personal nutrition goals will be established when you do your nutrition analysis with the dietician.  The following are general nutrition guidelines to  follow: Cholesterol < 200mg /day Sodium < 1500mg /day Fiber: Men over 50 yrs - 30 grams per day  Personal Goals: Personal Goals and Risk Factors at Admission - 01/18/20 1626      Core Components/Risk Factors/Patient Goals on Admission    Weight Management  Obesity;Yes    Intervention  Weight Management/Obesity: Establish reasonable short term and long term weight goals.;Weight Management: Provide education and appropriate resources to help participant work on and attain dietary goals.;Weight Management: Develop a combined nutrition and exercise program designed to reach desired caloric intake, while maintaining appropriate intake of nutrient and fiber, sodium and fats, and appropriate energy expenditure required for the weight goal.;Obesity: Provide education and appropriate resources to help participant work on and attain dietary goals.    Admit Weight  292 lb 3.2 oz (132.5 kg)    Goal Weight: Short Term  282 lb (127.9 kg)    Goal Weight: Long Term  272 lb (123.4 kg)    Expected Outcomes  Short Term: Continue  to assess and modify interventions until short term weight is achieved;Long Term: Adherence to nutrition and physical activity/exercise program aimed toward attainment of established weight goal;Weight Loss: Understanding of general recommendations for a balanced deficit meal plan, which promotes 1-2 lb weight loss per week and includes a negative energy balance of [phone] kcal/d    Intervention  Provide education about signs/symptoms and action to take for hypo/hyperglycemia.;Provide education about proper nutrition, including hydration, and aerobic/resistive exercise prescription along with prescribed medications to achieve blood glucose in normal ranges: Fasting glucose 65-99 mg/dL    Expected Outcomes  Short Term: Participant verbalizes understanding of the signs/symptoms and immediate care of hyper/hypoglycemia, proper foot care and importance of medication, aerobic/resistive exercise and  nutrition plan for blood glucose control.;Long Term: Attainment of HbA1C < 7%.    Heart Failure  Yes    Intervention  Provide a combined exercise and nutrition program that is supplemented with education, support and counseling about heart failure. Directed toward relieving symptoms such as shortness of breath, decreased exercise tolerance, and extremity edema.    Expected Outcomes  Improve functional capacity of life;Short term: Attendance in program 2-3 days a week with increased exercise capacity. Reported lower sodium intake. Reported increased fruit and vegetable intake. Reports medication compliance.;Short term: Daily weights obtained and reported for increase. Utilizing diuretic protocols set by physician.;Long term: Adoption of self-care skills and reduction of barriers for early signs and symptoms recognition and intervention leading to self-care maintenance.    Hypertension  Yes    Intervention  Provide education on lifestyle modifcations including regular physical activity/exercise, weight management, moderate sodium restriction and increased consumption of fresh fruit, vegetables, and low fat dairy, alcohol moderation, and smoking cessation.;Monitor prescription use compliance.    Expected Outcomes  Short Term: Continued assessment and intervention until BP is < 140/70mm HG in hypertensive participants. < 130/40mm HG in hypertensive participants with diabetes, heart failure or chronic kidney disease.;Long Term: Maintenance of blood pressure at goal levels.    Lipids  Yes    Intervention  Provide education and support for participant on nutrition & aerobic/resistive exercise along with prescribed medications to achieve LDL 70mg , HDL >40mg .    Expected Outcomes  Short Term: Participant states understanding of desired cholesterol values and is compliant with medications prescribed. Participant is following exercise prescription and nutrition guidelines.;Long Term: Cholesterol controlled with  medications as prescribed, with individualized exercise RX and with personalized nutrition plan. Value goals: LDL < 70mg , HDL > 40 mg.    Stress  Yes    Intervention  Offer individual and/or small group education and counseling on adjustment to heart disease, stress management and health-related lifestyle change. Teach and support self-help strategies.;Refer participants experiencing significant psychosocial distress to appropriate mental health specialists for further evaluation and treatment. When possible, include family members and significant others in education/counseling sessions.    Expected Outcomes  Short Term: Participant demonstrates changes in health-related behavior, relaxation and other stress management skills, ability to obtain effective social support, and compliance with psychotropic medications if prescribed.;Long Term: Emotional wellbeing is indicated by absence of clinically significant psychosocial distress or social isolation.       Tobacco Use Initial Evaluation: Social History   Tobacco Use  Smoking Status Former Smoker  . Packs/day: 0.25  . Years: 25.00  . Pack years: 6.25  . Types: Cigarettes  . Quit date: 05/19/2018  . Years since quitting: 1.6  Smokeless Tobacco Never Used    Exercise Goals and Review: Exercise Goals  [place] Name 01/18/20 1628             Exercise Goals   Increase Physical Activity  Yes       Intervention  Provide advice, education, support and counseling about physical activity/exercise needs.;Develop an individualized exercise prescription for aerobic and resistive training based on initial evaluation findings, risk stratification, comorbidities and participant's personal goals.       Expected Outcomes  Short Term: Attend rehab on a regular basis to increase amount of physical activity.;Long Term: Add in home exercise to make exercise part of routine and to increase amount of physical activity.;Long Term: Exercising regularly at least 3-5  days a week.       Increase Strength and Stamina  Yes       Intervention  Provide advice, education, support and counseling about physical activity/exercise needs.;Develop an individualized exercise prescription for aerobic and resistive training based on initial evaluation findings, risk stratification, comorbidities and participant's personal goals.       Expected Outcomes  Short Term: Increase workloads from initial exercise prescription for resistance, speed, and METs.;Short Term: Perform resistance training exercises routinely during rehab and add in resistance training at home;Long Term: Improve cardiorespiratory fitness, muscular endurance and strength as measured by increased METs and functional capacity (6MWT)       Able to understand and use rate of perceived exertion (RPE) scale  Yes       Intervention  Provide education and explanation on how to use RPE scale       Expected Outcomes  Short Term: Able to use RPE daily in rehab to express subjective intensity level;Long Term:  Able to use RPE to guide intensity level when exercising independently       Able to understand and use Dyspnea scale  Yes       Intervention  Provide education and explanation on how to use Dyspnea scale       Expected Outcomes  Short Term: Able to use Dyspnea scale daily in rehab to express subjective sense of shortness of breath during exertion;Long Term: Able to use Dyspnea scale to guide intensity level when exercising independently       Knowledge and understanding of Target Heart Rate Range (THRR)  Yes       Intervention  Provide education and explanation of THRR including how the numbers were predicted and where they are located for reference       Expected Outcomes  Short Term: Able to state/look up THRR;Short Term: Able to use daily as guideline for intensity in rehab;Long Term: Able to use THRR to govern intensity when exercising independently       Able to check pulse independently  Yes       Intervention   Provide education and demonstration on how to check pulse in carotid and radial arteries.;Review the importance of being able to check your own pulse for safety during independent exercise       Expected Outcomes  Short Term: Able to explain why pulse checking is important during independent exercise;Long Term: Able to check pulse independently and accurately       Understanding of Exercise Prescription  Yes       Intervention  Provide education, explanation, and written materials on patient's individual exercise prescription       Expected Outcomes  Short Term: Able to explain program exercise prescription;Long Term: Able to explain home exercise prescription to exercise independently          Copy of  goals given to participant.

## 2020-01-18 NOTE — Progress Notes (Signed)
Cardiac Individual Treatment Plan  Patient Details  Name: Brian Williams MRN: [id number] Date of Birth: 07-31-1961 Referring Provider:     Cardiac Rehab from 01/18/2020 in [facility] Cardiac and Pulmonary Rehab  Referring Provider  Gollan      Initial Encounter Date:    Cardiac Rehab from 01/18/2020 in [facility] Cardiac and Pulmonary Rehab  Date  01/18/20      Visit Diagnosis: Stable angina ([place])  Patient's Home Medications on Admission:  Current Outpatient Medications:    aspirin EC 81 MG tablet, Take 81 mg by mouth daily. , Disp: 90 tablet, Rfl:    carvedilol (COREG) 25 MG tablet, Take 25 mg by mouth 2 (two) times daily with a meal., Disp: , Rfl:    CHANTIX CONTINUING MONTH PAK 1 MG tablet, TAKE 1 TABLET BY MOUTH TWICE A DAY, Disp: 60 tablet, Rfl: 2   clopidogrel (PLAVIX) 75 MG tablet, TAKE ONE TABLET BY MOUTH DAILY, Disp: 90 tablet, Rfl: 1   dapagliflozin propanediol (FARXIGA) 10 MG TABS tablet, Take 10 mg by mouth daily., Disp: , Rfl:    escitalopram (LEXAPRO) 20 MG tablet, TAKE ONE TABLET BY MOUTH DAILY FOR ANXIETY AND DEPRESSION, Disp: 90 tablet, Rfl: 1   ezetimibe (ZETIA) 10 MG tablet, Take 1 tablet (10 mg total) by mouth daily., Disp: 90 tablet, Rfl: 3   furosemide (LASIX) 40 MG tablet, TAKE TWO TABLETS BY MOUTH EVERY MORNING AND TAKE ONE TABLET BY MOUTH EVERY EVENING. MAY TAKE 1 TABLET IN THE AFTERNOON AS NEEDED, Disp: 360 tablet, Rfl: 2   gemfibrozil (LOPID) 600 MG tablet, TAKE ONE TABLET BY MOUTH DAILY, Disp: 90 tablet, Rfl: 0   HYDROcodone-acetaminophen (NORCO/VICODIN) 5-325 MG tablet, Take 1 tablet by mouth every 6 (six) hours as needed for moderate pain., Disp: 30 tablet, Rfl: 0   hydrOXYzine (ATARAX/VISTARIL) 25 MG tablet, Take 1 tablet (25 mg total) by mouth 2 (two) times daily as needed for anxiety., Disp: 180 tablet, Rfl: 0   ibuprofen (ADVIL,MOTRIN) 200 MG tablet, Take 600 mg by mouth every 6 (six) hours as needed., Disp: , Rfl:    insulin regular human CONCENTRATED  (HUMULIN R) 500 UNIT/ML injection, For use in pump, for a total of 130 units/day (Patient taking differently: For use in pump, for a total of 170 units/day), Disp: 20 mL, Rfl: 11   predniSONE (DELTASONE) 10 MG tablet, Take 3 tabs on days 1-3, take 2 tabs on days 4-6, take 1 tab on days 7-9, Disp: 18 tablet, Rfl: 0   rosuvastatin (CRESTOR) 20 MG tablet, TAKE ONE TABLET BY MOUTH EVERY NIGHT AT BEDTIME FOR CHOLESTEROL, Disp: 90 tablet, Rfl: 2   sacubitril-valsartan (ENTRESTO) 97-103 MG, Take 1 tablet by mouth 2 (two) times daily., Disp: , Rfl:    spironolactone (ALDACTONE) 50 MG tablet, Take 50 mg by mouth daily., Disp: , Rfl:    traZODone (DESYREL) 100 MG tablet, TAKE TWO TABLETS BY MOUTH EVERY NIGHT AT BEDTIME, Disp: 180 tablet, Rfl: 2  Past Medical History: Past Medical History:  Diagnosis Date   AICD (automatic cardioverter/defibrillator) present    Anxiety    takes Xanax as needed   Arthritis    back,knees,right shoulder   Back pain    Cardiomyopathy ([place])    CHF (congestive heart failure) (HCC)    takes Lasix and Aldactone daily   Coronary artery disease    takes Plavix daily   Depression    takes Zoloft daily   Diabetes mellitus without complication (HCC)    Humulin  R and Farxiga daily.Fasting blood sugar runs 140   GERD (gastroesophageal reflux disease)    Headache    History of bronchitis    History of colon polyps    benign   History of hiatal hernia    Hyperlipidemia    takes Fenofibrate,Crestor, and Zetia daily   Hypertension    takes Entresto and Coreg daily   MI (myocardial infarction) ([place]) 2001   Obesity    Peripheral neuropathy    Pneumonia    history of-last time about 14 yrs ago   PONV (postoperative nausea and vomiting)    after knee surgery 25 yrs ago b/p stayed elevated for a while   Presence of permanent cardiac pacemaker    Shortness of breath dyspnea    with exertion   Sleep apnea    uses CPAP nightly   Ventricular  tachycardia ([place])    s/p RFCA PVCs 2013    Tobacco Use: Social History   Tobacco Use  Smoking Status Former Smoker   Packs/day: 0.25   Years: 25.00   Pack years: 6.25   Types: Cigarettes   Quit date: 05/19/2018   Years since quitting: 1.6  Smokeless Tobacco Never Used    Labs: Recent Review Scientist, physiological    Labs for ITP Cardiac and Pulmonary Rehab Latest Ref Rng & Units 05/24/2016 05/25/2016 07/17/2017 10/31/2017 07/21/2018   Cholestrol 0 - 200 mg/dL - - 128 - 171   LDLDIRECT mg/dL - - 47.0 - 42.0   HDL >39.00 mg/dL - - 24.40(L) - 27.40(L)   Trlycerides 0.0 - 149.0 mg/dL - - 497.0 Triglyceride is over 400; calculations on Lipids are invalid.(H) - 952.0 Triglyceride is over 400; calculations on Lipids are invalid.(H)   Hemoglobin A1c - - - - 9.7 -   PHART 7.350 - 7.450 - - - - -   PCO2ART 32.0 - 48.0 mmHg - - - - -   HCO3 20.0 - 28.0 mmol/L - - - - -   TCO2 0 - 100 mmol/L 23 25 - - -   ACIDBASEDEF 0.0 - 2.0 mmol/L - - - - -   O2SAT % - - - - -       Exercise Target Goals: Exercise Program Goal: Individual exercise prescription set using results from initial 6 min walk test and THRR while considering  patients activity barriers and safety.   Exercise Prescription Goal: Initial exercise prescription builds to 30-45 minutes a day of aerobic activity, 2-3 days per week.  Home exercise guidelines will be given to patient during program as part of exercise prescription that the participant will acknowledge.   Education: Aerobic Exercise & Resistance Training: - Gives group verbal and written instruction on the various components of exercise. Focuses on aerobic and resistive training programs and the benefits of this training and how to safely progress through these programs..   Cardiac Rehab from 08/14/2016 in [facility] Cardiac and Pulmonary Rehab  Date  08/14/16  Educator  [facility]  Instruction Review Code (retired)  2- meets goals/outcomes      Education: Exercise & Equipment  Safety: - Individual verbal instruction and demonstration of equipment use and safety with use of the equipment.   Cardiac Rehab from 01/18/2020 in [facility] Cardiac and Pulmonary Rehab  Date  01/18/20  Educator  AS  Instruction Review Code  1- Verbalizes Understanding      Education: Exercise Physiology & General Exercise Guidelines: - Group verbal and written instruction with models to review the exercise physiology  of the cardiovascular system and associated critical values. Provides general exercise guidelines with specific guidelines to those with heart or lung disease.    Cardiac Rehab from 08/14/2016 in [facility] Cardiac and Pulmonary Rehab  Date  07/26/16  Educator  [facility]  Instruction Review Code (retired)  2- meets goals/outcomes      Education: Flexibility, Insurance underwriter, Mind/Body Relaxation: Provides group verbal/written instruction on the benefits of flexibility and balance training, including mind/body exercise modes such as yoga, pilates and tai chi.  Demonstration and skill practice provided.   Activity Barriers & Risk Stratification: Activity Barriers & Cardiac Risk Stratification - 01/15/20 1006      Activity Barriers & Cardiac Risk Stratification   Activity Barriers  Back Problems;Shortness of Breath;Joint Problems;Arthritis;Deconditioning;Chest Pain/Angina    Cardiac Risk Stratification  High       6 Minute Walk: 6 Minute Walk    Row Name 01/18/20 1608         6 Minute Walk   Phase  Initial     Distance  1030 feet     Walk Time  6 minutes     # of Rest Breaks  0     MPH  1.95     METS  2.55     RPE  13     Perceived Dyspnea   2     VO2 Peak  8.9     Resting HR  78 bpm     Resting BP  110/56     Resting Oxygen Saturation   96 %     Exercise Oxygen Saturation  during 6 min walk  966 %     Max Ex. HR  116 bpm     Max Ex. BP  136/56     2 Minute Post BP  114/64        Oxygen Initial Assessment:   Oxygen Re-Evaluation:   Oxygen Discharge (Final Oxygen  Re-Evaluation):   Initial Exercise Prescription: Initial Exercise Prescription - 01/18/20 1600      Date of Initial Exercise RX and Referring Provider   Date  01/18/20    Referring Provider  Gollan      Treadmill   MPH  1.8    Grade  0.5    Minutes  15    METs  2.5      NuStep   Level  2    SPM  80    Minutes  15    METs  2.5      REL-XR   Level  2    Speed  50    Minutes  15    METs  2.5      Biostep-RELP   Level  2    SPM  50    Minutes  15    METs  2      Prescription Details   Frequency (times per week)  3    Duration  Progress to 30 minutes of continuous aerobic without signs/symptoms of physical distress      Intensity   THRR 40-80% of Max Heartrate  111-145    Ratings of Perceived Exertion  11-13    Perceived Dyspnea  0-4      Resistance Training   Training Prescription  Yes    Weight  3 lb    Reps  10-15       Perform Capillary Blood Glucose checks as needed.  Exercise Prescription Changes: Exercise Prescription Changes    Row Name 01/18/20 1600  Response to Exercise   Blood Pressure (Admit)  110/56       Blood Pressure (Exercise)  136/56       Blood Pressure (Exit)  114/64       Heart Rate (Admit)  78 bpm       Heart Rate (Exercise)  116 bpm       Heart Rate (Exit)  89 bpm       Oxygen Saturation (Admit)  96 %       Oxygen Saturation (Exercise)  96 %       Rating of Perceived Exertion (Exercise)  13       Perceived Dyspnea (Exercise)  2       Symptoms  none          Exercise Comments:   Exercise Goals and Review: Exercise Goals    Row Name 01/18/20 1628             Exercise Goals   Increase Physical Activity  Yes       Intervention  Provide advice, education, support and counseling about physical activity/exercise needs.;Develop an individualized exercise prescription for aerobic and resistive training based on initial evaluation findings, risk stratification, comorbidities and participant's personal goals.        Expected Outcomes  Short Term: Attend rehab on a regular basis to increase amount of physical activity.;Long Term: Add in home exercise to make exercise part of routine and to increase amount of physical activity.;Long Term: Exercising regularly at least 3-5 days a week.       Increase Strength and Stamina  Yes       Intervention  Provide advice, education, support and counseling about physical activity/exercise needs.;Develop an individualized exercise prescription for aerobic and resistive training based on initial evaluation findings, risk stratification, comorbidities and participant's personal goals.       Expected Outcomes  Short Term: Increase workloads from initial exercise prescription for resistance, speed, and METs.;Short Term: Perform resistance training exercises routinely during rehab and add in resistance training at home;Long Term: Improve cardiorespiratory fitness, muscular endurance and strength as measured by increased METs and functional capacity (6MWT)       Able to understand and use rate of perceived exertion (RPE) scale  Yes       Intervention  Provide education and explanation on how to use RPE scale       Expected Outcomes  Short Term: Able to use RPE daily in rehab to express subjective intensity level;Long Term:  Able to use RPE to guide intensity level when exercising independently       Able to understand and use Dyspnea scale  Yes       Intervention  Provide education and explanation on how to use Dyspnea scale       Expected Outcomes  Short Term: Able to use Dyspnea scale daily in rehab to express subjective sense of shortness of breath during exertion;Long Term: Able to use Dyspnea scale to guide intensity level when exercising independently       Knowledge and understanding of Target Heart Rate Range (THRR)  Yes       Intervention  Provide education and explanation of THRR including how the numbers were predicted and where they are located for reference        Expected Outcomes  Short Term: Able to state/look up THRR;Short Term: Able to use daily as guideline for intensity in rehab;Long Term: Able to use THRR to govern intensity when exercising independently  Able to check pulse independently  Yes       Intervention  Provide education and demonstration on how to check pulse in carotid and radial arteries.;Review the importance of being able to check your own pulse for safety during independent exercise       Expected Outcomes  Short Term: Able to explain why pulse checking is important during independent exercise;Long Term: Able to check pulse independently and accurately       Understanding of Exercise Prescription  Yes       Intervention  Provide education, explanation, and written materials on patient's individual exercise prescription       Expected Outcomes  Short Term: Able to explain program exercise prescription;Long Term: Able to explain home exercise prescription to exercise independently          Exercise Goals Re-Evaluation :   Discharge Exercise Prescription (Final Exercise Prescription Changes): Exercise Prescription Changes - 01/18/20 1600      Response to Exercise   Blood Pressure (Admit)  110/56    Blood Pressure (Exercise)  136/56    Blood Pressure (Exit)  114/64    Heart Rate (Admit)  78 bpm    Heart Rate (Exercise)  116 bpm    Heart Rate (Exit)  89 bpm    Oxygen Saturation (Admit)  96 %    Oxygen Saturation (Exercise)  96 %    Rating of Perceived Exertion (Exercise)  13    Perceived Dyspnea (Exercise)  2    Symptoms  none       Nutrition:  Target Goals: Understanding of nutrition guidelines, daily intake of sodium <1558m, cholesterol <2044m calories 30% from fat and 7% or less from saturated fats, daily to have 5 or more servings of fruits and vegetables.  Education: Controlling Sodium/Reading Food Labels -Group verbal and written material supporting the discussion of sodium use in heart healthy nutrition.  Review and explanation with models, verbal and written materials for utilization of the food label.   Education: General Nutrition Guidelines/Fats and Fiber: -Group instruction provided by verbal, written material, models and posters to present the general guidelines for heart healthy nutrition. Gives an explanation and review of dietary fats and fiber.   Biometrics: Pre Biometrics - 01/18/20 1627      Pre Biometrics   Height  6' (1.829 m)    Weight  292 lb 3.2 oz (132.5 kg)    BMI (Calculated)  39.62    Single Leg Stand  2 seconds        Nutrition Therapy Plan and Nutrition Goals:   Nutrition Assessments:   MEDIFICTS Score Key:          ?70 Need to make dietary changes          40-70 Heart Healthy Diet         ? 40 Therapeutic Level Cholesterol Diet  Nutrition Goals Re-Evaluation:   Nutrition Goals Discharge (Final Nutrition Goals Re-Evaluation):   Psychosocial: Target Goals: Acknowledge presence or absence of significant depression and/or stress, maximize coping skills, provide positive support system. Participant is able to verbalize types and ability to use techniques and skills needed for reducing stress and depression.   Education: Depression - Provides group verbal and written instruction on the correlation between heart/lung disease and depressed mood, treatment options, and the stigmas associated with seeking treatment.   Education: Sleep Hygiene -Provides group verbal and written instruction about how sleep can affect your health.  Define sleep hygiene, discuss sleep cycles and impact of  sleep habits. Review good sleep hygiene tips.     Education: Stress and Anxiety: - Provides group verbal and written instruction about the health risks of elevated stress and causes of high stress.  Discuss the correlation between heart/lung disease and anxiety and treatment options. Review healthy ways to manage with stress and anxiety.    Initial Review & Psychosocial  Screening: Initial Psych Review & Screening - 01/15/20 1009      Initial Review   Current issues with  Current Depression;Current Psychotropic Meds;Current Sleep Concerns;Current Stress Concerns    Source of Stress Concerns  Financial;Chronic Illness;Family      Family Dynamics   Good Support System?  No    Concerns  No support system      Barriers   Psychosocial barriers to participate in program  There are no identifiable barriers or psychosocial needs.;The patient should benefit from training in stress management and relaxation.      Screening Interventions   Interventions  Encouraged to exercise;To provide support and resources with identified psychosocial needs;Provide feedback about the scores to participant    Expected Outcomes  Short Term goal: Utilizing psychosocial counselor, staff and physician to assist with identification of specific Stressors or current issues interfering with healing process. Setting desired goal for each stressor or current issue identified.;Long Term Goal: Stressors or current issues are controlled or eliminated.;Long Term goal: The participant improves quality of Life and PHQ9 Scores as seen by post scores and/or verbalization of changes;Short Term goal: Identification and review with participant of any Quality of Life or Depression concerns found by scoring the questionnaire.       Quality of Life Scores:  Quality of Life - 01/18/20 0809      Quality of Life   Select  Quality of Life      Quality of Life Scores   Health/Function Pre  4.13 %    Socioeconomic Pre  16.12 %    Psych/Spiritual Pre  8.5 %    Family Pre  21.6 %    GLOBAL Pre  10.24 %      Scores of 19 and below usually indicate a poorer quality of life in these areas.  A difference of  2-3 points is a clinically meaningful difference.  A difference of 2-3 points in the total score of the Quality of Life Index has been associated with significant improvement in overall quality of life,  self-image, physical symptoms, and general health in studies assessing change in quality of life.  PHQ-9: Recent Review Flowsheet Data    Depression screen [facility] 2/9 01/18/2020 07/23/2019 07/08/2017 07/16/2016 06/19/2016   Decreased Interest '1 1 3 2 ' 0   Down, Depressed, Hopeless '2  3 3 2 1   ' PHQ - 2 Score '3 4 6 4 1   ' Altered sleeping '1 1 3 3 ' -   Tired, decreased energy '3  3 3 3 ' -   Change in appetite 3  1 0 3 -   Feeling bad or failure about yourself  '3  3 3 3 ' -   Trouble concentrating '1 1 3 1 ' -   Moving slowly or fidgety/restless '3  2 3 ' 0 -   Suicidal thoughts 1  0 0 1 -   PHQ-9 Score '18 15 21 18 ' -   Difficult doing work/chores Extremely dIfficult Somewhat difficult Somewhat difficult Somewhat difficult -     Interpretation of Total Score  Total Score Depression Severity:  1-4 = Minimal depression, 5-9 = Mild depression,  10-14 = Moderate depression, 15-19 = Moderately severe depression, 20-27 = Severe depression   Psychosocial Evaluation and Intervention: Psychosocial Evaluation - 01/15/20 1020      Psychosocial Evaluation & Interventions   Comments  Javier reported a roller coaster of a time with his health. He is in good spirits and is looking forward to coming back to [facility]. He states he does not have a good support system and sometimes his depression and stress can be a lot, but for the most parts his medications are working. His finances have been a major stressor, but he is glad that Medicare is approved and will pay for this program.    Expected Outcomes  Short: attend cardiac rehab for exercise and education. Long; develop positive self care habits    Continue Psychosocial Services   Follow up required by staff       Psychosocial Re-Evaluation:   Psychosocial Discharge (Final Psychosocial Re-Evaluation):   Vocational Rehabilitation: Provide vocational rehab assistance to qualifying candidates.   Vocational Rehab Evaluation & Intervention: Vocational Rehab - 01/15/20  1008      Initial Vocational Rehab Evaluation & Intervention   Assessment shows need for Vocational Rehabilitation  No       Education: Education Goals: Education classes will be provided on a variety of topics geared toward better understanding of heart health and risk factor modification. Participant will state understanding/return demonstration of topics presented as noted by education test scores.  Learning Barriers/Preferences: Learning Barriers/Preferences - 01/15/20 1020      Learning Barriers/Preferences   Learning Barriers  None    Learning Preferences  None       General Cardiac Education Topics:  AED/CPR: - Group verbal and written instruction with the use of models to demonstrate the basic use of the AED with the basic ABC's of resuscitation.   Anatomy & Physiology of the Heart: - Group verbal and written instruction and models provide basic cardiac anatomy and physiology, with the coronary electrical and arterial systems. Review of Valvular disease and Heart Failure   Cardiac Procedures: - Group verbal and written instruction to review commonly prescribed medications for heart disease. Reviews the medication, class of the drug, and side effects. Includes the steps to properly store meds and maintain the prescription regimen. (beta blockers and nitrates)   Cardiac Medications I: - Group verbal and written instruction to review commonly prescribed medications for heart disease. Reviews the medication, class of the drug, and side effects. Includes the steps to properly store meds and maintain the prescription regimen.   Cardiac Medications II: -Group verbal and written instruction to review commonly prescribed medications for heart disease. Reviews the medication, class of the drug, and side effects. (all other drug classes)    Go Sex-Intimacy & Heart Disease, Get SMART - Goal Setting: - Group verbal and written instruction through game format to discuss heart  disease and the return to sexual intimacy. Provides group verbal and written material to discuss and apply goal setting through the application of the S.M.A.R.T. Method.   Other Matters of the Heart: - Provides group verbal, written materials and models to describe Stable Angina and Peripheral Artery. Includes description of the disease process and treatment options available to the cardiac patient.   Infection Prevention: - Provides verbal and written material to individual with discussion of infection control including proper hand washing and proper equipment cleaning during exercise session.   Cardiac Rehab from 01/18/2020 in [facility] Cardiac and Pulmonary Rehab  Date  01/18/20  Educator  AS  Instruction Review Code  1- Verbalizes Understanding      Falls Prevention: - Provides verbal and written material to individual with discussion of falls prevention and safety.   Cardiac Rehab from 01/18/2020 in [facility] Cardiac and Pulmonary Rehab  Date  01/18/20  Educator  AS  Instruction Review Code  1- Verbalizes Understanding      Other: -Provides group and verbal instruction on various topics (see comments)   Knowledge Questionnaire Score: Knowledge Questionnaire Score - 01/18/20 0809      Knowledge Questionnaire Score   Pre Score  24/26 Education Focus: Nutrition, exercise       Core Components/Risk Factors/Patient Goals at Admission: Personal Goals and Risk Factors at Admission - 01/18/20 1626      Core Components/Risk Factors/Patient Goals on Admission    Weight Management  Obesity;Yes    Intervention  Weight Management/Obesity: Establish reasonable short term and long term weight goals.;Weight Management: Provide education and appropriate resources to help participant work on and attain dietary goals.;Weight Management: Develop a combined nutrition and exercise program designed to reach desired caloric intake, while maintaining appropriate intake of nutrient and fiber, sodium and fats,  and appropriate energy expenditure required for the weight goal.;Obesity: Provide education and appropriate resources to help participant work on and attain dietary goals.    Admit Weight  292 lb 3.2 oz (132.5 kg)    Goal Weight: Short Term  282 lb (127.9 kg)    Goal Weight: Long Term  272 lb (123.4 kg)    Expected Outcomes  Short Term: Continue to assess and modify interventions until short term weight is achieved;Long Term: Adherence to nutrition and physical activity/exercise program aimed toward attainment of established weight goal;Weight Loss: Understanding of general recommendations for a balanced deficit meal plan, which promotes 1-2 lb weight loss per week and includes a negative energy balance of [phone] kcal/d    Intervention  Provide education about signs/symptoms and action to take for hypo/hyperglycemia.;Provide education about proper nutrition, including hydration, and aerobic/resistive exercise prescription along with prescribed medications to achieve blood glucose in normal ranges: Fasting glucose 65-99 mg/dL    Expected Outcomes  Short Term: Participant verbalizes understanding of the signs/symptoms and immediate care of hyper/hypoglycemia, proper foot care and importance of medication, aerobic/resistive exercise and nutrition plan for blood glucose control.;Long Term: Attainment of HbA1C < 7%.    Heart Failure  Yes    Intervention  Provide a combined exercise and nutrition program that is supplemented with education, support and counseling about heart failure. Directed toward relieving symptoms such as shortness of breath, decreased exercise tolerance, and extremity edema.    Expected Outcomes  Improve functional capacity of life;Short term: Attendance in program 2-3 days a week with increased exercise capacity. Reported lower sodium intake. Reported increased fruit and vegetable intake. Reports medication compliance.;Short term: Daily weights obtained and reported for increase.  Utilizing diuretic protocols set by physician.;Long term: Adoption of self-care skills and reduction of barriers for early signs and symptoms recognition and intervention leading to self-care maintenance.    Hypertension  Yes    Intervention  Provide education on lifestyle modifcations including regular physical activity/exercise, weight management, moderate sodium restriction and increased consumption of fresh fruit, vegetables, and low fat dairy, alcohol moderation, and smoking cessation.;Monitor prescription use compliance.    Expected Outcomes  Short Term: Continued assessment and intervention until BP is < 140/85m HG in hypertensive participants. < 130/835mHG in hypertensive participants with diabetes, heart failure or chronic  kidney disease.;Long Term: Maintenance of blood pressure at goal levels.    Lipids  Yes    Intervention  Provide education and support for participant on nutrition & aerobic/resistive exercise along with prescribed medications to achieve LDL <49m, HDL >445m    Expected Outcomes  Short Term: Participant states understanding of desired cholesterol values and is compliant with medications prescribed. Participant is following exercise prescription and nutrition guidelines.;Long Term: Cholesterol controlled with medications as prescribed, with individualized exercise RX and with personalized nutrition plan. Value goals: LDL < 7053mHDL > 40 mg.    Stress  Yes    Intervention  Offer individual and/or small group education and counseling on adjustment to heart disease, stress management and health-related lifestyle change. Teach and support self-help strategies.;Refer participants experiencing significant psychosocial distress to appropriate mental health specialists for further evaluation and treatment. When possible, include family members and significant others in education/counseling sessions.    Expected Outcomes  Short Term: Participant demonstrates changes in health-related  behavior, relaxation and other stress management skills, ability to obtain effective social support, and compliance with psychotropic medications if prescribed.;Long Term: Emotional wellbeing is indicated by absence of clinically significant psychosocial distress or social isolation.       Education:Diabetes - Individual verbal and written instruction to review signs/symptoms of diabetes, desired ranges of glucose level fasting, after meals and with exercise. Acknowledge that pre and post exercise glucose checks will be done for 3 sessions at entry of program.   [facility]  Date  01/15/20  Educator  [facility] Review Code  1- VerUnited States Steel Corporationderstanding      Education: Know Your Numbers and Risk Factors: -Group verbal and written instruction about important numbers in your health.  Discussion of what are risk factors and how they play a role in the disease process.  Review of Cholesterol, Blood Pressure, Diabetes, and BMI and the role they play in your overall health.   Core Components/Risk Factors/Patient Goals Review:    Core Components/Risk Factors/Patient Goals at Discharge (Final Review):    ITP Comments: ITP Comments    Row Name 01/15/20 1019           ITP Comments  Initial telephone orientation completed. Diagnosis can be found in [facility] 4/6. EP orientation scheduled for 5/3 at 3pm.          Comments: initial ITP

## 2020-01-20 ENCOUNTER — Encounter: Payer: Medicare Other | Admitting: *Deleted

## 2020-01-20 ENCOUNTER — Other Ambulatory Visit: Payer: Self-pay

## 2020-01-20 DIAGNOSIS — I208 Other forms of angina pectoris: Secondary | ICD-10-CM | POA: Diagnosis not present

## 2020-01-20 DIAGNOSIS — Z79899 Other long term (current) drug therapy: Secondary | ICD-10-CM | POA: Diagnosis not present

## 2020-01-20 DIAGNOSIS — E119 Type 2 diabetes mellitus without complications: Secondary | ICD-10-CM | POA: Diagnosis not present

## 2020-01-20 DIAGNOSIS — I509 Heart failure, unspecified: Secondary | ICD-10-CM | POA: Diagnosis not present

## 2020-01-20 DIAGNOSIS — I429 Cardiomyopathy, unspecified: Secondary | ICD-10-CM | POA: Diagnosis not present

## 2020-01-20 DIAGNOSIS — G4730 Sleep apnea, unspecified: Secondary | ICD-10-CM | POA: Diagnosis not present

## 2020-01-20 DIAGNOSIS — Z87891 Personal history of nicotine dependence: Secondary | ICD-10-CM | POA: Diagnosis not present

## 2020-01-20 DIAGNOSIS — Z7901 Long term (current) use of anticoagulants: Secondary | ICD-10-CM | POA: Diagnosis not present

## 2020-01-20 DIAGNOSIS — Z7902 Long term (current) use of antithrombotics/antiplatelets: Secondary | ICD-10-CM | POA: Diagnosis not present

## 2020-01-20 DIAGNOSIS — I110 Hypertensive heart disease with heart failure: Secondary | ICD-10-CM | POA: Diagnosis not present

## 2020-01-20 DIAGNOSIS — Z7982 Long term (current) use of aspirin: Secondary | ICD-10-CM | POA: Diagnosis not present

## 2020-01-20 DIAGNOSIS — I252 Old myocardial infarction: Secondary | ICD-10-CM | POA: Diagnosis not present

## 2020-01-20 DIAGNOSIS — E785 Hyperlipidemia, unspecified: Secondary | ICD-10-CM | POA: Diagnosis not present

## 2020-01-20 DIAGNOSIS — Z794 Long term (current) use of insulin: Secondary | ICD-10-CM | POA: Diagnosis not present

## 2020-01-20 LAB — GLUCOSE, CAPILLARY
Glucose-Capillary: 303 mg/dL — ABNORMAL HIGH (ref 70–99)
Glucose-Capillary: 257 mg/dL — ABNORMAL HIGH (ref 70–99)

## 2020-01-20 NOTE — Progress Notes (Signed)
Daily Session Note  Patient Details  Name: Brian Williams MRN: [id number] Date of Birth: Jul 06, 1961 Referring Provider:     Cardiac Rehab from 01/18/2020 in [facility] Cardiac and Pulmonary Rehab  Referring Provider  Gollan      Encounter Date: 01/20/2020  Check In: Session Check In - 01/20/20 4098      Check-In   Supervising physician immediately available to respond to emergencies  See telemetry face sheet for immediately available ER MD    Location  [facility]-Cardiac & Pulmonary Rehab    Staff Present  Justin Mend RCP,RRT,BSRT;Amanda Oletta Darter, BA, ACSM CEP, Exercise Physiologist;Other   Idalia Needle, RN   Virtual Visit  No    Medication changes reported      No    Fall or balance concerns reported     No    Warm-up and Cool-down  Performed on first and last piece of equipment    Resistance Training Performed  Yes    VAD Patient?  No    PAD/SET Patient?  No      Pain Assessment   Currently in Pain?  No/denies          Social History   Tobacco Use  Smoking Status Former Smoker  . Packs/day: 0.25  . Years: 25.00  . Pack years: 6.25  . Types: Cigarettes  . Quit date: 05/19/2018  . Years since quitting: 1.6  Smokeless Tobacco Never Used    Goals Met:  Exercise tolerated well Personal goals reviewed No report of cardiac concerns or symptoms Strength training completed today  Goals Unmet:  BP low upon entry today, came up with exercise BS pump malfunctioning last night, gave insulin himself an insulin bolus before coming to exercise   Comments: First full day of exercise!  Patient was oriented to gym and equipment including functions, settings, policies, and procedures.  Patient's individual exercise prescription and treatment plan were reviewed.  All starting workloads were established based on the results of the 6 minute walk test done at initial orientation visit.  The plan for exercise progression was also introduced and progression will be customized based on patient's  performance and goals.    Dr. Emily Filbert is Medical Director for [place] and [facility].

## 2020-01-22 ENCOUNTER — Other Ambulatory Visit: Payer: Self-pay

## 2020-01-22 ENCOUNTER — Encounter: Payer: Medicare Other | Admitting: *Deleted

## 2020-01-22 DIAGNOSIS — I110 Hypertensive heart disease with heart failure: Secondary | ICD-10-CM | POA: Diagnosis not present

## 2020-01-22 DIAGNOSIS — Z7902 Long term (current) use of antithrombotics/antiplatelets: Secondary | ICD-10-CM | POA: Diagnosis not present

## 2020-01-22 DIAGNOSIS — Z7982 Long term (current) use of aspirin: Secondary | ICD-10-CM | POA: Diagnosis not present

## 2020-01-22 DIAGNOSIS — I252 Old myocardial infarction: Secondary | ICD-10-CM | POA: Diagnosis not present

## 2020-01-22 DIAGNOSIS — Z7901 Long term (current) use of anticoagulants: Secondary | ICD-10-CM | POA: Diagnosis not present

## 2020-01-22 DIAGNOSIS — E119 Type 2 diabetes mellitus without complications: Secondary | ICD-10-CM | POA: Diagnosis not present

## 2020-01-22 DIAGNOSIS — E785 Hyperlipidemia, unspecified: Secondary | ICD-10-CM | POA: Diagnosis not present

## 2020-01-22 DIAGNOSIS — Z79899 Other long term (current) drug therapy: Secondary | ICD-10-CM | POA: Diagnosis not present

## 2020-01-22 DIAGNOSIS — Z87891 Personal history of nicotine dependence: Secondary | ICD-10-CM | POA: Diagnosis not present

## 2020-01-22 DIAGNOSIS — I208 Other forms of angina pectoris: Secondary | ICD-10-CM | POA: Diagnosis not present

## 2020-01-22 DIAGNOSIS — G4730 Sleep apnea, unspecified: Secondary | ICD-10-CM | POA: Diagnosis not present

## 2020-01-22 DIAGNOSIS — I509 Heart failure, unspecified: Secondary | ICD-10-CM | POA: Diagnosis not present

## 2020-01-22 DIAGNOSIS — Z794 Long term (current) use of insulin: Secondary | ICD-10-CM | POA: Diagnosis not present

## 2020-01-22 DIAGNOSIS — I429 Cardiomyopathy, unspecified: Secondary | ICD-10-CM | POA: Diagnosis not present

## 2020-01-22 LAB — GLUCOSE, CAPILLARY
Glucose-Capillary: 200 mg/dL — ABNORMAL HIGH (ref 70–99)
Glucose-Capillary: 185 mg/dL — ABNORMAL HIGH (ref 70–99)

## 2020-01-22 NOTE — Progress Notes (Signed)
Daily Session Note  Patient Details  Name: JONAVIN SEDER MRN: [id number] Date of Birth: 1960-11-18 Referring Provider:     Cardiac Rehab from 01/18/2020 in [facility] Cardiac and Pulmonary Rehab  Referring Provider  Gollan      Encounter Date: 01/22/2020  Check In: Session Check In - 01/22/20 0841      Check-In   Supervising physician immediately available to respond to emergencies  See telemetry face sheet for immediately available ER MD    Location  [facility]-Cardiac & Pulmonary Rehab    Staff Present  Heath Lark, RN, BSN, CCRP;Joseph Hood RCP,RRT,BSRT;Jessica [place], [place], [place], [place], CCET    Virtual Visit  No    Medication changes reported     Yes  New prednisone taper and vicodin for back pain   Fall or balance concerns reported     No    Warm-up and Cool-down  Performed on first and last piece of equipment    Resistance Training Performed  Yes    VAD Patient?  No    PAD/SET Patient?  No      Pain Assessment   Currently in Pain?  No/denies          Social History   Tobacco Use  Smoking Status Former Smoker  . Packs/day: 0.25  . Years: 25.00  . Pack years: 6.25  . Types: Cigarettes  . Quit date: 05/19/2018  . Years since quitting: 1.6  Smokeless Tobacco Never Used    Goals Met:  Independence with exercise equipment Exercise tolerated well No report of cardiac concerns or symptoms  Goals Unmet:  Not Applicable  Comments: Pt able to follow exercise prescription today without complaint.  Will continue to monitor for progression.    Dr. Emily Filbert is Medical Director for [place] and [facility].

## 2020-01-27 ENCOUNTER — Encounter: Payer: Medicare Other | Admitting: *Deleted

## 2020-01-27 ENCOUNTER — Other Ambulatory Visit: Payer: Self-pay

## 2020-01-27 DIAGNOSIS — I110 Hypertensive heart disease with heart failure: Secondary | ICD-10-CM | POA: Diagnosis not present

## 2020-01-27 DIAGNOSIS — I208 Other forms of angina pectoris: Secondary | ICD-10-CM

## 2020-01-27 DIAGNOSIS — I509 Heart failure, unspecified: Secondary | ICD-10-CM | POA: Diagnosis not present

## 2020-01-27 DIAGNOSIS — Z7982 Long term (current) use of aspirin: Secondary | ICD-10-CM | POA: Diagnosis not present

## 2020-01-27 DIAGNOSIS — Z794 Long term (current) use of insulin: Secondary | ICD-10-CM | POA: Diagnosis not present

## 2020-01-27 DIAGNOSIS — E119 Type 2 diabetes mellitus without complications: Secondary | ICD-10-CM | POA: Diagnosis not present

## 2020-01-27 DIAGNOSIS — E1165 Type 2 diabetes mellitus with hyperglycemia: Secondary | ICD-10-CM | POA: Diagnosis not present

## 2020-01-27 DIAGNOSIS — I429 Cardiomyopathy, unspecified: Secondary | ICD-10-CM | POA: Diagnosis not present

## 2020-01-27 DIAGNOSIS — E785 Hyperlipidemia, unspecified: Secondary | ICD-10-CM | POA: Diagnosis not present

## 2020-01-27 DIAGNOSIS — I252 Old myocardial infarction: Secondary | ICD-10-CM | POA: Diagnosis not present

## 2020-01-27 DIAGNOSIS — G4730 Sleep apnea, unspecified: Secondary | ICD-10-CM | POA: Diagnosis not present

## 2020-01-27 DIAGNOSIS — Z7902 Long term (current) use of antithrombotics/antiplatelets: Secondary | ICD-10-CM | POA: Diagnosis not present

## 2020-01-27 DIAGNOSIS — Z87891 Personal history of nicotine dependence: Secondary | ICD-10-CM | POA: Diagnosis not present

## 2020-01-27 DIAGNOSIS — Z79899 Other long term (current) drug therapy: Secondary | ICD-10-CM | POA: Diagnosis not present

## 2020-01-27 DIAGNOSIS — Z7901 Long term (current) use of anticoagulants: Secondary | ICD-10-CM | POA: Diagnosis not present

## 2020-01-27 LAB — GLUCOSE, CAPILLARY
Glucose-Capillary: 145 mg/dL — ABNORMAL HIGH (ref 70–99)
Glucose-Capillary: 143 mg/dL — ABNORMAL HIGH (ref 70–99)

## 2020-01-27 NOTE — Progress Notes (Signed)
Daily Session Note  Patient Details  Name: Brian Williams MRN: [id number] Date of Birth: [birth year] Referring Provider:     Cardiac Rehab from 01/18/2020 in [facility] Cardiac and Pulmonary Rehab  Referring Provider  Gollan      Encounter Date: 01/27/2020  Check In: Session Check In - 01/27/20 0859      Check-In   Supervising physician immediately available to respond to emergencies  See telemetry face sheet for immediately available ER MD    Location  [facility]-Cardiac & Pulmonary Rehab    Staff Present  Heath Lark, RN, BSN, CCRP;Jessica [place], MA, RCEP, CCRP, CCET;Joseph [place], [place], ACSM CEP, Exercise Physiologist    Virtual Visit  No    Medication changes reported      No    Fall or balance concerns reported     No    Warm-up and Cool-down  Performed on first and last piece of equipment    Resistance Training Performed  Yes    VAD Patient?  No    PAD/SET Patient?  No      Pain Assessment   Currently in Pain?  No/denies          Social History   Tobacco Use  Smoking Status Former Smoker  . Packs/day: 0.25  . Years: 25.00  . Pack years: 6.25  . Types: Cigarettes  . Quit date: 05/19/2018  . Years since quitting: 1.6  Smokeless Tobacco Never Used    Goals Met:  Exercise tolerated well No report of cardiac concerns or symptoms  Goals Unmet:  Not Applicable  Comments: Pt able to follow exercise prescription today without complaint.  Will continue to monitor for progression.    Dr. Emily Filbert is Medical Director for [place] and [facility].

## 2020-01-29 ENCOUNTER — Other Ambulatory Visit: Payer: Self-pay

## 2020-01-29 ENCOUNTER — Encounter: Payer: Medicare Other | Admitting: *Deleted

## 2020-01-29 DIAGNOSIS — I208 Other forms of angina pectoris: Secondary | ICD-10-CM

## 2020-01-29 DIAGNOSIS — Z7902 Long term (current) use of antithrombotics/antiplatelets: Secondary | ICD-10-CM | POA: Diagnosis not present

## 2020-01-29 DIAGNOSIS — Z79899 Other long term (current) drug therapy: Secondary | ICD-10-CM | POA: Diagnosis not present

## 2020-01-29 DIAGNOSIS — I509 Heart failure, unspecified: Secondary | ICD-10-CM | POA: Diagnosis not present

## 2020-01-29 DIAGNOSIS — E785 Hyperlipidemia, unspecified: Secondary | ICD-10-CM | POA: Diagnosis not present

## 2020-01-29 DIAGNOSIS — Z7901 Long term (current) use of anticoagulants: Secondary | ICD-10-CM | POA: Diagnosis not present

## 2020-01-29 DIAGNOSIS — I252 Old myocardial infarction: Secondary | ICD-10-CM | POA: Diagnosis not present

## 2020-01-29 DIAGNOSIS — E119 Type 2 diabetes mellitus without complications: Secondary | ICD-10-CM | POA: Diagnosis not present

## 2020-01-29 DIAGNOSIS — Z7982 Long term (current) use of aspirin: Secondary | ICD-10-CM | POA: Diagnosis not present

## 2020-01-29 DIAGNOSIS — Z794 Long term (current) use of insulin: Secondary | ICD-10-CM | POA: Diagnosis not present

## 2020-01-29 DIAGNOSIS — I429 Cardiomyopathy, unspecified: Secondary | ICD-10-CM | POA: Diagnosis not present

## 2020-01-29 DIAGNOSIS — I110 Hypertensive heart disease with heart failure: Secondary | ICD-10-CM | POA: Diagnosis not present

## 2020-01-29 DIAGNOSIS — Z87891 Personal history of nicotine dependence: Secondary | ICD-10-CM | POA: Diagnosis not present

## 2020-01-29 DIAGNOSIS — G4730 Sleep apnea, unspecified: Secondary | ICD-10-CM | POA: Diagnosis not present

## 2020-01-29 NOTE — Progress Notes (Signed)
Daily Session Note  Patient Details  Name: Brian Williams MRN: [id number] Date of Birth: Jun 04, 1961 Referring Provider:     Cardiac Rehab from 01/18/2020 in [facility] Cardiac and Pulmonary Rehab  Referring Provider  Gollan      Encounter Date: 01/29/2020  Check In: Session Check In - 01/29/20 0914      Check-In   Supervising physician immediately available to respond to emergencies  See telemetry face sheet for immediately available ER MD    Location  [facility]-Cardiac & Pulmonary Rehab    Staff Present  Basilia Jumbo, RN, BSN;Joseph [address] [place], [place], [place], [place], CCET    Virtual Visit  No    Medication changes reported      No    Fall or balance concerns reported     No    Warm-up and Cool-down  Performed on first and last piece of equipment    Resistance Training Performed  Yes    VAD Patient?  No    PAD/SET Patient?  No      Pain Assessment   Currently in Pain?  No/denies          Social History   Tobacco Use  Smoking Status Former Smoker  . Packs/day: 0.25  . Years: 25.00  . Pack years: 6.25  . Types: Cigarettes  . Quit date: 05/19/2018  . Years since quitting: 1.6  Smokeless Tobacco Never Used    Goals Met:  Independence with exercise equipment Exercise tolerated well No report of cardiac concerns or symptoms  Goals Unmet:  Not Applicable  Comments: Pt able to follow exercise prescription today without complaint.  Will continue to monitor for progression.   Dr. Emily Filbert is Medical Director for [place] and [facility].

## 2020-01-30 DIAGNOSIS — M7662 Achilles tendinitis, left leg: Secondary | ICD-10-CM | POA: Diagnosis not present

## 2020-02-01 ENCOUNTER — Ambulatory Visit (INDEPENDENT_AMBULATORY_CARE_PROVIDER_SITE_OTHER)
Admission: RE | Admit: 2020-02-01 | Discharge: 2020-02-01 | Disposition: A | Discharge status: Home / Self Care | Payer: Medicare Other | Source: Ambulatory Visit | Attending: Family Medicine | Admitting: Family Medicine

## 2020-02-01 ENCOUNTER — Ambulatory Visit
Admission: RE | Admit: 2020-02-01 | Discharge: 2020-02-01 | Disposition: A | Discharge status: Home / Self Care | Payer: Medicare Other | Source: Ambulatory Visit | Attending: Family Medicine | Admitting: Family Medicine

## 2020-02-01 ENCOUNTER — Other Ambulatory Visit: Payer: Self-pay

## 2020-02-01 ENCOUNTER — Encounter: Payer: Self-pay | Admitting: Family Medicine

## 2020-02-01 ENCOUNTER — Ambulatory Visit (INDEPENDENT_AMBULATORY_CARE_PROVIDER_SITE_OTHER): Payer: Medicare Other | Admitting: Family Medicine

## 2020-02-01 VITALS — BP 120/64 | HR 79 | Temp 98.1°F | Ht 72.0 in | Wt 293.8 lb

## 2020-02-01 DIAGNOSIS — M170 Bilateral primary osteoarthritis of knee: Secondary | ICD-10-CM

## 2020-02-01 DIAGNOSIS — Z6841 Body Mass Index (BMI) 40.0 and over, adult: Secondary | ICD-10-CM

## 2020-02-01 DIAGNOSIS — E118 Type 2 diabetes mellitus with unspecified complications: Secondary | ICD-10-CM

## 2020-02-01 DIAGNOSIS — Z7902 Long term (current) use of antithrombotics/antiplatelets: Secondary | ICD-10-CM

## 2020-02-01 DIAGNOSIS — IMO0002 Reserved for concepts with insufficient information to code with codable children: Secondary | ICD-10-CM

## 2020-02-01 DIAGNOSIS — E1165 Type 2 diabetes mellitus with hyperglycemia: Secondary | ICD-10-CM

## 2020-02-01 DIAGNOSIS — M7662 Achilles tendinitis, left leg: Secondary | ICD-10-CM

## 2020-02-01 DIAGNOSIS — M1712 Unilateral primary osteoarthritis, left knee: Secondary | ICD-10-CM | POA: Diagnosis not present

## 2020-02-01 DIAGNOSIS — M1711 Unilateral primary osteoarthritis, right knee: Secondary | ICD-10-CM | POA: Diagnosis not present

## 2020-02-01 MED ORDER — NITROGLYCERIN 0.2 MG/HR TD PT24
MEDICATED_PATCH | TRANSDERMAL | 2 refills | Status: DC
Start: 2020-02-01 — End: 2020-04-05

## 2020-02-01 NOTE — Patient Instructions (Addendum)
Ibuprofen 2 tabs (400 mg total) up to 3 times a day  OSTEOARTHRITIS: Over the counter  Topical Capzaicin Cream, as needed (wear glove to put on) - THIS IS EXCEPTIONALLY HOT  Weight loss will always take stress off of the joints and back  Volaren 1% gel. Over the counter You can apply up to 4 times a day Minimal is absorbed in the bloodstream Cost is about 9 dollars  Ice joints on bad days, 20 min, 2-3 x / day REGULAR EXERCISE: swimming, Yoga, Tai Chi, bicycle (NON-IMPACT activity)     --------------------------------------------------------------------------------------------  Achilles Rehab  Calf raises on a step (or seated) First lower and then raise on 1 foot If this is painful lower on 1 foot but do the heel raise on both feet  Begin with 3 sets of 10 repetitions  Increase by 5 repetitions every 3 days  Goal is 3 sets of 30 repetitions  Do with both knee straight and knee at 20 degrees of flexion  If pain persists at 3 sets of 30 - add backpack with 5 lbs Increase by 5 lbs per week to max of 30 lbs

## 2020-02-01 NOTE — Progress Notes (Signed)
Brian Williams T. Brian Wiebelhaus, MD, [facility]  Primary Care and Sports Medicine [facility] at [facility] [place], [address]  Phone: [phone]  FAX: [place] - 59 y.o. male  MRN [id number]  Date of Birth: Oct 16, 1960  Date: 02/01/2020  PCP: Brian Koch, NP  Referral: Brian Koch, NP  Chief Complaint  Patient presents with  . Knee Pain    Bilateral  . Foot Pain    Left-seen at [facility] on Saturday    This visit occurred during the SARS-CoV-2 public health emergency.  Safety protocols were in place, including screening questions prior to the visit, additional usage of staff PPE, and extensive cleaning of exam room while observing appropriate contact time as indicated for disinfecting solutions.   Subjective:   Brian Williams is a 59 y.o. very pleasant male patient with Body mass index is 39.84 kg/m. who presents with the following:  The patient presents with multiple musculoskeletal complaints.  Initially he started talking to me about his chronic Achilles tendinopathy, but after this conversation had been ongoing for at least 15 minutes he redirected the conversation to complaints about some chronic bilateral knee pain.  Right knees have been bothering on him on the number of years, but he is having more difficulty and feeling as if he has some symptomatic giving way.  He has not had any kind of acute knee injury, but he does have a history of having 2 knee arthroscopies on the right as well as one left-sided knee arthroscopy.  Exact procedure is not entirely clear.  He does have some intermittent effusions and pain medially as well as laterally.  He also has some restriction of motion.  He is also had some Achilles swelling and tenderness on the left for greater than a year.  He has seen podiatry in the past as well as orthopedics.  Per his report he has had some injections around the Achilles  tendon, but this history is not entirely clear.  He had some abrupt tenderness in his Achilles tendon at 3:30 PM on Friday, but he retains full motion and strength in his ankle.  His diabetes is very poorly controlled.  He is currently seeing endocrinology at [facility].  Lab Results  Component Value Date   HGBA1C 9.7 10/31/2017     Review of Systems is noted in the HPI, as appropriate   Objective:   BP 120/64   Pulse 79   Temp 98.1 F (36.7 C) (Temporal)   Ht 6' (1.829 m)   Wt 293 lb 12 oz (133.2 kg)   SpO2 97%   BMI 39.84 kg/m   GEN: No acute distress; alert,appropriate. PULM: Breathing comfortably in no respiratory distress PSYCH: Normally interactive.   Foot: Left Echymosis: no Edema: no ROM: full LE B Gait: heel toe, non-antalgic MT pain: no Callus pattern: none Lateral Mall: NT Medial Mall: NT Talus: NT Navicular: NT Cuboid: NT Calcaneous: NT Metatarsals: NT 5th MT: NT Phalanges: NT Achilles: PAINFUL TO PALPATE AT INSERTION ON LEFT, moderate sized NODULE Plantar Fascia: NT Fat Pad: NT Peroneals: NT Post Tib: NT Great Toe: Nml motion Ant Drawer: neg ATFL: NT CFL: NT Deltoid: NT Sensation: intact   Bilateral knee exam: Mild effusion.  Pain on the medial and lateral joint lines as well as pain at the medial and lateral patellar facets with compression.  Nontender at the patellar and quad tendons.  ACL, PCL, MCL, and  LCL are all intact.  Does have pain with terminal flexion and extension.  Extension is limited with loss of 5 degrees of terminal extension.  Flexion to 110 to 115 degrees.  Neurovascularly intact in his lower extremities.  Strength is preserved and 5/5 throughout.  Radiology: DG Lumbar Spine Complete  Result Date: 01/14/2020 CLINICAL DATA:  Low back pain with left-sided radicular symptoms EXAM: LUMBAR SPINE - COMPLETE 4+ VIEW COMPARISON:  None. FINDINGS: Frontal, lateral, spot lumbosacral lateral, and bilateral oblique views were obtained. There  are 5 non-rib-bearing lumbar type vertebral bodies. There is no fracture or spondylolisthesis. There is moderate disc space narrowing at L2-3, L3-4, L4-5, and L5-S1. There is facet osteoarthritic change at L4-5 and L5-S1 bilaterally. There are foci of aortic atherosclerosis. IMPRESSION: Osteoarthritic change at L2-3, L3-4, L4-5, and L5-S1. No evident fracture or spondylolisthesis. Aortic Atherosclerosis (ICD10-I70.0). Electronically Signed   By: Lowella Grip III M.D.   On: 01/14/2020 16:43   DG Knee 4 Views W/Patella Left  Result Date: 02/02/2020 CLINICAL DATA:  Initial evaluation for primary osteoarthritis of the knees. EXAM: LEFT KNEE - COMPLETE 4+ VIEW; RIGHT KNEE - COMPLETE 4+ VIEW COMPARISON:  None. FINDINGS: No acute fracture or dislocation seen about either knee. Moderate to severe tricompartmental degenerative osteoarthritic changes present about both knees, perhaps slightly worse on the left. Associated prominent intercondylar spurring noted bilaterally. No joint effusion. Visualized soft tissues demonstrate no acute finding. Scattered vascular calcifications noted about both knees. Surgical clips present at the medial aspect of the left knee. IMPRESSION: 1. Moderate to severe tricompartmental degenerative osteoarthritic changes about both knees, perhaps slightly worse on the left. 2. No acute osseous abnormality. Electronically Signed   By: Jeannine Boga M.D.   On: 02/02/2020 06:26   DG Knee 4 Views W/Patella Right  Result Date: 02/02/2020 CLINICAL DATA:  Initial evaluation for primary osteoarthritis of the knees. EXAM: LEFT KNEE - COMPLETE 4+ VIEW; RIGHT KNEE - COMPLETE 4+ VIEW COMPARISON:  None. FINDINGS: No acute fracture or dislocation seen about either knee. Moderate to severe tricompartmental degenerative osteoarthritic changes present about both knees, perhaps slightly worse on the left. Associated prominent intercondylar spurring noted bilaterally. No joint effusion. Visualized  soft tissues demonstrate no acute finding. Scattered vascular calcifications noted about both knees. Surgical clips present at the medial aspect of the left knee. IMPRESSION: 1. Moderate to severe tricompartmental degenerative osteoarthritic changes about both knees, perhaps slightly worse on the left. 2. No acute osseous abnormality. Electronically Signed   By: Jeannine Boga M.D.   On: 02/02/2020 06:26    Assessment and Plan:     ICD-10-CM   1. Primary osteoarthritis of knees, bilateral  M17.0 DG Knee 4 Views W/Patella Left    DG Knee 4 Views W/Patella Right  2. Achilles tendinitis of left lower extremity  M76.62   3. Morbid obesity with BMI of 40.0-44.9, adult ([place])  E66.01    Z68.41   4. Diabetes mellitus type 2, uncontrolled, with complications (HCC)  Z76.7    E11.65   5. Antiplatelet or antithrombotic long-term use  Z79.02    Total encounter time: 40 minutes. On the day of the patient encounter, this can include review of prior records, labs, and imaging.  Additional time can include counselling, consultation with peer MD in person or by telephone.  This also includes independent review of Radiology.  Extensive chart review.  Review of multiple medical problems with the patient and multiple orthopedic problems.  Redirection from initial questions regarding Achilles  tendinopathy to that of bilateral knee osteoarthritis.  Complicated medical case with multiple orthopedic issues and medical comorbidities which challenge treatment course and limited options..  Pathophysiology of achilles tendinopathy reviewed.  It is entirely possible that he had a small injury to his Achilles last Friday, but his strength and function is entirely preserved.  Additionally, I have given the patient the program emphasizing eccentric overloading detailed in the instructions based on Dr. Trudi Ida work and protocols.  Supportive footwear reviewed.   Greater than 1 year history with failure of multiple  attempts to improve the patient's situation.  I will place the patient on topical patches of nitroglycerin.  Hopefully his blood pressure will tolerate this, but if he feels pain at all of asked him to discontinue this.  Plain films were also independently reviewed by myself and he has moderate to advanced osteoarthritis bilaterally with extensive osteophytosis and subchondral sclerosis.  Complete loss of joint space at the patellofemoral joint.  Challenging in the setting of multiple medical problems.  Patient Instructions  Ibuprofen 2 tabs (400 mg total) up to 3 times a day  OSTEOARTHRITIS: Over the counter  Topical Capzaicin Cream, as needed (wear glove to put on) - THIS IS EXCEPTIONALLY HOT  Weight loss will always take stress off of the joints and back  Volaren 1% gel. Over the counter You can apply up to 4 times a day Minimal is absorbed in the bloodstream Cost is about 9 dollars  Ice joints on bad days, 20 min, 2-3 x / day REGULAR EXERCISE: swimming, Yoga, Tai Chi, bicycle (NON-IMPACT activity)     --------------------------------------------------------------------------------------------  Achilles Rehab  Calf raises on a step (or seated) First lower and then raise on 1 foot If this is painful lower on 1 foot but do the heel raise on both feet  Begin with 3 sets of 10 repetitions  Increase by 5 repetitions every 3 days  Goal is 3 sets of 30 repetitions  Do with both knee straight and knee at 20 degrees of flexion  If pain persists at 3 sets of 30 - add backpack with 5 lbs Increase by 5 lbs per week to max of 30 lbs     Follow-up: 6 weeks  Meds ordered this encounter  Medications  . nitroGLYCERIN (NITRODUR - DOSED IN MG/24 HR) 0.2 mg/hr patch    Sig: Apply 1/4 patch to affected area as directed by MD and change every 24 hours.    Dispense:  30 patch    Refill:  2   Medications Discontinued During This Encounter  Medication Reason  .  HYDROcodone-acetaminophen (NORCO/VICODIN) 5-325 MG tablet Completed Course  . predniSONE (DELTASONE) 10 MG tablet Completed Course   Orders Placed This Encounter  Procedures  . DG Knee 4 Views W/Patella Left  . DG Knee 4 Views W/Patella Right    Signed,  Frederico Hamman T. Daymon Hora, MD   Outpatient Encounter Medications as of 02/01/2020  Medication Sig  . aspirin EC 81 MG tablet Take 81 mg by mouth daily.   . carvedilol (COREG) 25 MG tablet Take 25 mg by mouth 2 (two) times daily with a meal.  . CHANTIX CONTINUING MONTH PAK 1 MG tablet TAKE 1 TABLET BY MOUTH TWICE A DAY  . clopidogrel (PLAVIX) 75 MG tablet TAKE ONE TABLET BY MOUTH DAILY  . dapagliflozin propanediol (FARXIGA) 10 MG TABS tablet Take 10 mg by mouth daily.  Marland Kitchen escitalopram (LEXAPRO) 20 MG tablet TAKE ONE TABLET BY MOUTH DAILY FOR ANXIETY AND  DEPRESSION  . ezetimibe (ZETIA) 10 MG tablet Take 1 tablet (10 mg total) by mouth daily.  . furosemide (LASIX) 40 MG tablet TAKE TWO TABLETS BY MOUTH EVERY MORNING AND TAKE ONE TABLET BY MOUTH EVERY EVENING. MAY TAKE 1 TABLET IN THE AFTERNOON AS NEEDED  . gemfibrozil (LOPID) 600 MG tablet TAKE ONE TABLET BY MOUTH DAILY  . Glucagon 3 MG/DOSE POWD 1 spray into the left nostril once as needed (hypoglycemia) for up to 1 dose.  . hydrOXYzine (ATARAX/VISTARIL) 25 MG tablet Take 1 tablet (25 mg total) by mouth 2 (two) times daily as needed for anxiety.  Marland Kitchen ibuprofen (ADVIL,MOTRIN) 200 MG tablet Take 600 mg by mouth every 6 (six) hours as needed.  . insulin regular human CONCENTRATED (HUMULIN R) 500 UNIT/ML injection For use in pump, for a total of 130 units/day (Patient taking differently: For use in pump, for a total of 170 units/day)  . oxyCODONE-acetaminophen (PERCOCET/ROXICET) 5-325 MG tablet Take by mouth.  . rosuvastatin (CRESTOR) 20 MG tablet TAKE ONE TABLET BY MOUTH EVERY NIGHT AT BEDTIME FOR CHOLESTEROL  . sacubitril-valsartan (ENTRESTO) 97-103 MG Take 1 tablet by mouth 2 (two) times daily.  Marland Kitchen  spironolactone (ALDACTONE) 50 MG tablet Take 50 mg by mouth daily.  . traZODone (DESYREL) 100 MG tablet TAKE TWO TABLETS BY MOUTH EVERY NIGHT AT BEDTIME  . nitroGLYCERIN (NITRODUR - DOSED IN MG/24 HR) 0.2 mg/hr patch Apply 1/4 patch to affected area as directed by MD and change every 24 hours.  . [DISCONTINUED] DULoxetine (CYMBALTA) 30 MG capsule Take 1 capsule by mouth for 7 days, then 2 capsules thereafter for depression.  . [DISCONTINUED] HYDROcodone-acetaminophen (NORCO/VICODIN) 5-325 MG tablet Take 1 tablet by mouth every 6 (six) hours as needed for moderate pain.  . [DISCONTINUED] predniSONE (DELTASONE) 10 MG tablet Take 3 tabs on days 1-3, take 2 tabs on days 4-6, take 1 tab on days 7-9   No facility-administered encounter medications on file as of 02/01/2020.

## 2020-02-03 ENCOUNTER — Encounter: Payer: Self-pay | Admitting: *Deleted

## 2020-02-03 DIAGNOSIS — I208 Other forms of angina pectoris: Secondary | ICD-10-CM

## 2020-02-03 NOTE — Progress Notes (Signed)
Cardiac Individual Treatment Plan  Patient Details  Name: Brian Williams MRN: [id number] Date of Birth: 1960-11-11 Referring Provider:     Cardiac Rehab from 01/18/2020 in [facility] Cardiac and Pulmonary Rehab  Referring Provider  Gollan      Initial Encounter Date:    Cardiac Rehab from 01/18/2020 in [facility] Cardiac and Pulmonary Rehab  Date  01/18/20      Visit Diagnosis: Stable angina ([place])  Patient's Home Medications on Admission:  Current Outpatient Medications:    aspirin EC 81 MG tablet, Take 81 mg by mouth daily. , Disp: 90 tablet, Rfl:    carvedilol (COREG) 25 MG tablet, Take 25 mg by mouth 2 (two) times daily with a meal., Disp: , Rfl:    CHANTIX CONTINUING MONTH PAK 1 MG tablet, TAKE 1 TABLET BY MOUTH TWICE A DAY, Disp: 60 tablet, Rfl: 2   clopidogrel (PLAVIX) 75 MG tablet, TAKE ONE TABLET BY MOUTH DAILY, Disp: 90 tablet, Rfl: 1   dapagliflozin propanediol (FARXIGA) 10 MG TABS tablet, Take 10 mg by mouth daily., Disp: , Rfl:    escitalopram (LEXAPRO) 20 MG tablet, TAKE ONE TABLET BY MOUTH DAILY FOR ANXIETY AND DEPRESSION, Disp: 90 tablet, Rfl: 1   ezetimibe (ZETIA) 10 MG tablet, Take 1 tablet (10 mg total) by mouth daily., Disp: 90 tablet, Rfl: 3   furosemide (LASIX) 40 MG tablet, TAKE TWO TABLETS BY MOUTH EVERY MORNING AND TAKE ONE TABLET BY MOUTH EVERY EVENING. MAY TAKE 1 TABLET IN THE AFTERNOON AS NEEDED, Disp: 360 tablet, Rfl: 2   gemfibrozil (LOPID) 600 MG tablet, TAKE ONE TABLET BY MOUTH DAILY, Disp: 90 tablet, Rfl: 0   Glucagon 3 MG/DOSE POWD, 1 spray into the left nostril once as needed (hypoglycemia) for up to 1 dose., Disp: , Rfl:    hydrOXYzine (ATARAX/VISTARIL) 25 MG tablet, Take 1 tablet (25 mg total) by mouth 2 (two) times daily as needed for anxiety., Disp: 180 tablet, Rfl: 0   ibuprofen (ADVIL,MOTRIN) 200 MG tablet, Take 600 mg by mouth every 6 (six) hours as needed., Disp: , Rfl:    insulin regular human CONCENTRATED (HUMULIN R) 500 UNIT/ML injection, For  use in pump, for a total of 130 units/day (Patient taking differently: For use in pump, for a total of 170 units/day), Disp: 20 mL, Rfl: 11   nitroGLYCERIN (NITRODUR - DOSED IN MG/24 HR) 0.2 mg/hr patch, Apply 1/4 patch to affected area as directed by MD and change every 24 hours., Disp: 30 patch, Rfl: 2   oxyCODONE-acetaminophen (PERCOCET/ROXICET) 5-325 MG tablet, Take by mouth., Disp: , Rfl:    rosuvastatin (CRESTOR) 20 MG tablet, TAKE ONE TABLET BY MOUTH EVERY NIGHT AT BEDTIME FOR CHOLESTEROL, Disp: 90 tablet, Rfl: 2   sacubitril-valsartan (ENTRESTO) 97-103 MG, Take 1 tablet by mouth 2 (two) times daily., Disp: , Rfl:    spironolactone (ALDACTONE) 50 MG tablet, Take 50 mg by mouth daily., Disp: , Rfl:    traZODone (DESYREL) 100 MG tablet, TAKE TWO TABLETS BY MOUTH EVERY NIGHT AT BEDTIME, Disp: 180 tablet, Rfl: 2  Past Medical History: Past Medical History:  Diagnosis Date   AICD (automatic cardioverter/defibrillator) present    Anxiety    takes Xanax as needed   Arthritis    back,knees,right shoulder   Back pain    Cardiomyopathy ([place])    CHF (congestive heart failure) (HCC)    takes Lasix and Aldactone daily   Coronary artery disease    takes Plavix daily   Depression  takes Zoloft daily   Diabetes mellitus without complication (HCC)    Humulin R and Farxiga daily.Fasting blood sugar runs 140   GERD (gastroesophageal reflux disease)    Headache    History of bronchitis    History of colon polyps    benign   History of hiatal hernia    Hyperlipidemia    takes Fenofibrate,Crestor, and Zetia daily   Hypertension    takes Entresto and Coreg daily   MI (myocardial infarction) ([place]) 2001   Obesity    Peripheral neuropathy    Pneumonia    history of-last time about 14 yrs ago   PONV (postoperative nausea and vomiting)    after knee surgery 25 yrs ago b/p stayed elevated for a while   Presence of permanent cardiac pacemaker    Shortness of breath  dyspnea    with exertion   Sleep apnea    uses CPAP nightly   Ventricular tachycardia ([place])    s/p RFCA PVCs 2013    Tobacco Use: Social History   Tobacco Use  Smoking Status Former Smoker   Packs/day: 0.25   Years: 25.00   Pack years: 6.25   Types: Cigarettes   Quit date: 05/19/2018   Years since quitting: 1.7  Smokeless Tobacco Never Used    Labs: Recent Review Scientist, physiological    Labs for ITP Cardiac and Pulmonary Rehab Latest Ref Rng & Units 05/24/2016 05/25/2016 07/17/2017 10/31/2017 07/21/2018   Cholestrol 0 - 200 mg/dL - - 128 - 171   LDLDIRECT mg/dL - - 47.0 - 42.0   HDL >39.00 mg/dL - - 24.40(L) - 27.40(L)   Trlycerides 0.0 - 149.0 mg/dL - - 497.0 Triglyceride is over 400; calculations on Lipids are invalid.(H) - 952.0 Triglyceride is over 400; calculations on Lipids are invalid.(H)   Hemoglobin A1c - - - - 9.7 -   PHART 7.350 - 7.450 - - - - -   PCO2ART 32.0 - 48.0 mmHg - - - - -   HCO3 20.0 - 28.0 mmol/L - - - - -   TCO2 0 - 100 mmol/L 23 25 - - -   ACIDBASEDEF 0.0 - 2.0 mmol/L - - - - -   O2SAT % - - - - -       Exercise Target Goals: Exercise Program Goal: Individual exercise prescription set using results from initial 6 min walk test and THRR while considering  patients activity barriers and safety.   Exercise Prescription Goal: Initial exercise prescription builds to 30-45 minutes a day of aerobic activity, 2-3 days per week.  Home exercise guidelines will be given to patient during program as part of exercise prescription that the participant will acknowledge.   Education: Aerobic Exercise & Resistance Training: - Gives group verbal and written instruction on the various components of exercise. Focuses on aerobic and resistive training programs and the benefits of this training and how to safely progress through these programs..   Cardiac Rehab from 08/14/2016 in [facility] Cardiac and Pulmonary Rehab  Date  08/14/16  Educator  [facility]  Instruction Review Code  (retired)  2- meets goals/outcomes      Education: Exercise & Equipment Safety: - Individual verbal instruction and demonstration of equipment use and safety with use of the equipment.   Cardiac Rehab from 01/18/2020 in [facility] Cardiac and Pulmonary Rehab  Date  01/18/20  Educator  AS  Instruction Review Code  1- Verbalizes Understanding      Education: Exercise Physiology & General Exercise  Guidelines: - Group verbal and written instruction with models to review the exercise physiology of the cardiovascular system and associated critical values. Provides general exercise guidelines with specific guidelines to those with heart or lung disease.    Cardiac Rehab from 08/14/2016 in [facility] Cardiac and Pulmonary Rehab  Date  07/26/16  Educator  [facility]  Instruction Review Code (retired)  2- meets goals/outcomes      Education: Flexibility, Insurance underwriter, Mind/Body Relaxation: Provides group verbal/written instruction on the benefits of flexibility and balance training, including mind/body exercise modes such as yoga, pilates and tai chi.  Demonstration and skill practice provided.   Activity Barriers & Risk Stratification: Activity Barriers & Cardiac Risk Stratification - 01/15/20 1006      Activity Barriers & Cardiac Risk Stratification   Activity Barriers  Back Problems;Shortness of Breath;Joint Problems;Arthritis;Deconditioning;Chest Pain/Angina    Cardiac Risk Stratification  High       6 Minute Walk: 6 Minute Walk    Row Name 01/18/20 1608         6 Minute Walk   Phase  Initial     Distance  1030 feet     Walk Time  6 minutes     # of Rest Breaks  0     MPH  1.95     METS  2.55     RPE  13     Perceived Dyspnea   2     VO2 Peak  8.9     Resting HR  78 bpm     Resting BP  110/56     Resting Oxygen Saturation   96 %     Exercise Oxygen Saturation  during 6 min walk  966 %     Max Ex. HR  116 bpm     Max Ex. BP  136/56     2 Minute Post BP  114/64        Oxygen Initial  Assessment:   Oxygen Re-Evaluation:   Oxygen Discharge (Final Oxygen Re-Evaluation):   Initial Exercise Prescription: Initial Exercise Prescription - 01/18/20 1600      Date of Initial Exercise RX and Referring Provider   Date  01/18/20    Referring Provider  Gollan      Treadmill   MPH  1.8    Grade  0.5    Minutes  15    METs  2.5      NuStep   Level  2    SPM  80    Minutes  15    METs  2.5      REL-XR   Level  2    Speed  50    Minutes  15    METs  2.5      Biostep-RELP   Level  2    SPM  50    Minutes  15    METs  2      Prescription Details   Frequency (times per week)  3    Duration  Progress to 30 minutes of continuous aerobic without signs/symptoms of physical distress      Intensity   THRR 40-80% of Max Heartrate  111-145    Ratings of Perceived Exertion  11-13    Perceived Dyspnea  0-4      Resistance Training   Training Prescription  Yes    Weight  3 lb    Reps  10-15       Perform Capillary Blood Glucose checks as needed.  Exercise Prescription Changes: Exercise Prescription Changes    Row Name 01/18/20 1600             Response to Exercise   Blood Pressure (Admit)  110/56       Blood Pressure (Exercise)  136/56       Blood Pressure (Exit)  114/64       Heart Rate (Admit)  78 bpm       Heart Rate (Exercise)  116 bpm       Heart Rate (Exit)  89 bpm       Oxygen Saturation (Admit)  96 %       Oxygen Saturation (Exercise)  96 %       Rating of Perceived Exertion (Exercise)  13       Perceived Dyspnea (Exercise)  2       Symptoms  none          Exercise Comments: Exercise Comments    Row Name 01/20/20 0939           Exercise Comments  First full day of exercise!  Patient was oriented to gym and equipment including functions, settings, policies, and procedures.  Patient's individual exercise prescription and treatment plan were reviewed.  All starting workloads were established based on the results of the 6 minute walk  test done at initial orientation visit.  The plan for exercise progression was also introduced and progression will be customized based on patient's performance and goals.          Exercise Goals and Review: Exercise Goals    Row Name 01/18/20 1628             Exercise Goals   Increase Physical Activity  Yes       Intervention  Provide advice, education, support and counseling about physical activity/exercise needs.;Develop an individualized exercise prescription for aerobic and resistive training based on initial evaluation findings, risk stratification, comorbidities and participant's personal goals.       Expected Outcomes  Short Term: Attend rehab on a regular basis to increase amount of physical activity.;Long Term: Add in home exercise to make exercise part of routine and to increase amount of physical activity.;Long Term: Exercising regularly at least 3-5 days a week.       Increase Strength and Stamina  Yes       Intervention  Provide advice, education, support and counseling about physical activity/exercise needs.;Develop an individualized exercise prescription for aerobic and resistive training based on initial evaluation findings, risk stratification, comorbidities and participant's personal goals.       Expected Outcomes  Short Term: Increase workloads from initial exercise prescription for resistance, speed, and METs.;Short Term: Perform resistance training exercises routinely during rehab and add in resistance training at home;Long Term: Improve cardiorespiratory fitness, muscular endurance and strength as measured by increased METs and functional capacity (6MWT)       Able to understand and use rate of perceived exertion (RPE) scale  Yes       Intervention  Provide education and explanation on how to use RPE scale       Expected Outcomes  Short Term: Able to use RPE daily in rehab to express subjective intensity level;Long Term:  Able to use RPE to guide intensity level when  exercising independently       Able to understand and use Dyspnea scale  Yes       Intervention  Provide education and explanation on how to use Dyspnea scale  Expected Outcomes  Short Term: Able to use Dyspnea scale daily in rehab to express subjective sense of shortness of breath during exertion;Long Term: Able to use Dyspnea scale to guide intensity level when exercising independently       Knowledge and understanding of Target Heart Rate Range (THRR)  Yes       Intervention  Provide education and explanation of THRR including how the numbers were predicted and where they are located for reference       Expected Outcomes  Short Term: Able to state/look up THRR;Short Term: Able to use daily as guideline for intensity in rehab;Long Term: Able to use THRR to govern intensity when exercising independently       Able to check pulse independently  Yes       Intervention  Provide education and demonstration on how to check pulse in carotid and radial arteries.;Review the importance of being able to check your own pulse for safety during independent exercise       Expected Outcomes  Short Term: Able to explain why pulse checking is important during independent exercise;Long Term: Able to check pulse independently and accurately       Understanding of Exercise Prescription  Yes       Intervention  Provide education, explanation, and written materials on patient's individual exercise prescription       Expected Outcomes  Short Term: Able to explain program exercise prescription;Long Term: Able to explain home exercise prescription to exercise independently          Exercise Goals Re-Evaluation : Exercise Goals Re-Evaluation    Row Name 01/20/20 0940             Exercise Goal Re-Evaluation   Exercise Goals Review  Increase Physical Activity;Increase Strength and Stamina;Able to understand and use rate of perceived exertion (RPE) scale;Able to understand and use Dyspnea scale;Knowledge and  understanding of Target Heart Rate Range (THRR);Understanding of Exercise Prescription       Comments  Reviewed RPE and dyspnea scales, THR and program prescription with pt today.  Pt voiced understanding and was given a copy of goals to take home.       Expected Outcomes  Short: Use RPE daily to regulate intensity. Long: Follow program prescription in THR.          Discharge Exercise Prescription (Final Exercise Prescription Changes): Exercise Prescription Changes - 01/18/20 1600      Response to Exercise   Blood Pressure (Admit)  110/56    Blood Pressure (Exercise)  136/56    Blood Pressure (Exit)  114/64    Heart Rate (Admit)  78 bpm    Heart Rate (Exercise)  116 bpm    Heart Rate (Exit)  89 bpm    Oxygen Saturation (Admit)  96 %    Oxygen Saturation (Exercise)  96 %    Rating of Perceived Exertion (Exercise)  13    Perceived Dyspnea (Exercise)  2    Symptoms  none       Nutrition:  Target Goals: Understanding of nutrition guidelines, daily intake of sodium <1570m, cholesterol <2065m calories 30% from fat and 7% or less from saturated fats, daily to have 5 or more servings of fruits and vegetables.  Education: Controlling Sodium/Reading Food Labels -Group verbal and written material supporting the discussion of sodium use in heart healthy nutrition. Review and explanation with models, verbal and written materials for utilization of the food label.   Education: General Nutrition Guidelines/Fats and Fiber: -Group  instruction provided by verbal, written material, models and posters to present the general guidelines for heart healthy nutrition. Gives an explanation and review of dietary fats and fiber.   Biometrics: Pre Biometrics - 01/18/20 1627      Pre Biometrics   Height  6' (1.829 m)    Weight  292 lb 3.2 oz (132.5 kg)    BMI (Calculated)  39.62    Single Leg Stand  2 seconds        Nutrition Therapy Plan and Nutrition Goals:   Nutrition  Assessments:   MEDIFICTS Score Key:          ?70 Need to make dietary changes          40-70 Heart Healthy Diet         ? 40 Therapeutic Level Cholesterol Diet  Nutrition Goals Re-Evaluation:   Nutrition Goals Discharge (Final Nutrition Goals Re-Evaluation):   Psychosocial: Target Goals: Acknowledge presence or absence of significant depression and/or stress, maximize coping skills, provide positive support system. Participant is able to verbalize types and ability to use techniques and skills needed for reducing stress and depression.   Education: Depression - Provides group verbal and written instruction on the correlation between heart/lung disease and depressed mood, treatment options, and the stigmas associated with seeking treatment.   Education: Sleep Hygiene -Provides group verbal and written instruction about how sleep can affect your health.  Define sleep hygiene, discuss sleep cycles and impact of sleep habits. Review good sleep hygiene tips.     Education: Stress and Anxiety: - Provides group verbal and written instruction about the health risks of elevated stress and causes of high stress.  Discuss the correlation between heart/lung disease and anxiety and treatment options. Review healthy ways to manage with stress and anxiety.    Initial Review & Psychosocial Screening: Initial Psych Review & Screening - 01/15/20 1009      Initial Review   Current issues with  Current Depression;Current Psychotropic Meds;Current Sleep Concerns;Current Stress Concerns    Source of Stress Concerns  Financial;Chronic Illness;Family      Family Dynamics   Good Support System?  No    Concerns  No support system      Barriers   Psychosocial barriers to participate in program  There are no identifiable barriers or psychosocial needs.;The patient should benefit from training in stress management and relaxation.      Screening Interventions   Interventions  Encouraged to  exercise;To provide support and resources with identified psychosocial needs;Provide feedback about the scores to participant    Expected Outcomes  Short Term goal: Utilizing psychosocial counselor, staff and physician to assist with identification of specific Stressors or current issues interfering with healing process. Setting desired goal for each stressor or current issue identified.;Long Term Goal: Stressors or current issues are controlled or eliminated.;Long Term goal: The participant improves quality of Life and PHQ9 Scores as seen by post scores and/or verbalization of changes;Short Term goal: Identification and review with participant of any Quality of Life or Depression concerns found by scoring the questionnaire.       Quality of Life Scores:  Quality of Life - 01/18/20 0809      Quality of Life   Select  Quality of Life      Quality of Life Scores   Health/Function Pre  4.13 %    Socioeconomic Pre  16.12 %    Psych/Spiritual Pre  8.5 %    Family Pre  21.6 %  GLOBAL Pre  10.24 %      Scores of 19 and below usually indicate a poorer quality of life in these areas.  A difference of  2-3 points is a clinically meaningful difference.  A difference of 2-3 points in the total score of the Quality of Life Index has been associated with significant improvement in overall quality of life, self-image, physical symptoms, and general health in studies assessing change in quality of life.  PHQ-9: Recent Review Flowsheet Data    Depression screen [facility] 2/9 01/18/2020 07/23/2019 07/08/2017 07/16/2016 06/19/2016   Decreased Interest '1 1 3 2 ' 0   Down, Depressed, Hopeless '2  3 3 2 1   ' PHQ - 2 Score '3 4 6 4 1   ' Altered sleeping '1 1 3 3 ' -   Tired, decreased energy '3  3 3 3 ' -   Change in appetite 3  1 0 3 -   Feeling bad or failure about yourself  '3  3 3 3 ' -   Trouble concentrating '1 1 3 1 ' -   Moving slowly or fidgety/restless '3  2 3 ' 0 -   Suicidal thoughts 1  0 0 1 -   PHQ-9 Score '18 15 21 18 ' -    Difficult doing work/chores Extremely dIfficult Somewhat difficult Somewhat difficult Somewhat difficult -     Interpretation of Total Score  Total Score Depression Severity:  1-4 = Minimal depression, 5-9 = Mild depression, 10-14 = Moderate depression, 15-19 = Moderately severe depression, 20-27 = Severe depression   Psychosocial Evaluation and Intervention: Psychosocial Evaluation - 01/15/20 1020      Psychosocial Evaluation & Interventions   Comments  Brian Williams reported a roller coaster of a time with his health. He is in good spirits and is looking forward to coming back to [facility]. He states he does not have a good support system and sometimes his depression and stress can be a lot, but for the most parts his medications are working. His finances have been a major stressor, but he is glad that Medicare is approved and will pay for this program.    Expected Outcomes  Short: attend cardiac rehab for exercise and education. Long; develop positive self care habits    Continue Psychosocial Services   Follow up required by staff       Psychosocial Re-Evaluation:   Psychosocial Discharge (Final Psychosocial Re-Evaluation):   Vocational Rehabilitation: Provide vocational rehab assistance to qualifying candidates.   Vocational Rehab Evaluation & Intervention: Vocational Rehab - 01/15/20 1008      Initial Vocational Rehab Evaluation & Intervention   Assessment shows need for Vocational Rehabilitation  No       Education: Education Goals: Education classes will be provided on a variety of topics geared toward better understanding of heart health and risk factor modification. Participant will state understanding/return demonstration of topics presented as noted by education test scores.  Learning Barriers/Preferences: Learning Barriers/Preferences - 01/15/20 1020      Learning Barriers/Preferences   Learning Barriers  None    Learning Preferences  None       General Cardiac  Education Topics:  AED/CPR: - Group verbal and written instruction with the use of models to demonstrate the basic use of the AED with the basic ABC's of resuscitation.   Anatomy & Physiology of the Heart: - Group verbal and written instruction and models provide basic cardiac anatomy and physiology, with the coronary electrical and arterial systems. Review of Valvular disease and Heart Failure  Cardiac Procedures: - Group verbal and written instruction to review commonly prescribed medications for heart disease. Reviews the medication, class of the drug, and side effects. Includes the steps to properly store meds and maintain the prescription regimen. (beta blockers and nitrates)   Cardiac Medications I: - Group verbal and written instruction to review commonly prescribed medications for heart disease. Reviews the medication, class of the drug, and side effects. Includes the steps to properly store meds and maintain the prescription regimen.   Cardiac Medications II: -Group verbal and written instruction to review commonly prescribed medications for heart disease. Reviews the medication, class of the drug, and side effects. (all other drug classes)    Go Sex-Intimacy & Heart Disease, Get SMART - Goal Setting: - Group verbal and written instruction through game format to discuss heart disease and the return to sexual intimacy. Provides group verbal and written material to discuss and apply goal setting through the application of the S.M.A.R.T. Method.   Other Matters of the Heart: - Provides group verbal, written materials and models to describe Stable Angina and Peripheral Artery. Includes description of the disease process and treatment options available to the cardiac patient.   Infection Prevention: - Provides verbal and written material to individual with discussion of infection control including proper hand washing and proper equipment cleaning during exercise session.    Cardiac Rehab from 01/18/2020 in [facility] Cardiac and Pulmonary Rehab  Date  01/18/20  Educator  AS  Instruction Review Code  1- Verbalizes Understanding      Falls Prevention: - Provides verbal and written material to individual with discussion of falls prevention and safety.   Cardiac Rehab from 01/18/2020 in [facility] Cardiac and Pulmonary Rehab  Date  01/18/20  Educator  AS  Instruction Review Code  1- Verbalizes Understanding      Other: -Provides group and verbal instruction on various topics (see comments)   Knowledge Questionnaire Score: Knowledge Questionnaire Score - 01/18/20 0809      Knowledge Questionnaire Score   Pre Score  24/26 Education Focus: Nutrition, exercise       Core Components/Risk Factors/Patient Goals at Admission: Personal Goals and Risk Factors at Admission - 01/18/20 1626      Core Components/Risk Factors/Patient Goals on Admission    Weight Management  Obesity;Yes    Intervention  Weight Management/Obesity: Establish reasonable short term and long term weight goals.;Weight Management: Provide education and appropriate resources to help participant work on and attain dietary goals.;Weight Management: Develop a combined nutrition and exercise program designed to reach desired caloric intake, while maintaining appropriate intake of nutrient and fiber, sodium and fats, and appropriate energy expenditure required for the weight goal.;Obesity: Provide education and appropriate resources to help participant work on and attain dietary goals.    Admit Weight  292 lb 3.2 oz (132.5 kg)    Goal Weight: Short Term  282 lb (127.9 kg)    Goal Weight: Long Term  272 lb (123.4 kg)    Expected Outcomes  Short Term: Continue to assess and modify interventions until short term weight is achieved;Long Term: Adherence to nutrition and physical activity/exercise program aimed toward attainment of established weight goal;Weight Loss: Understanding of general recommendations for a  balanced deficit meal plan, which promotes 1-2 lb weight loss per week and includes a negative energy balance of [phone] kcal/d    Intervention  Provide education about signs/symptoms and action to take for hypo/hyperglycemia.;Provide education about proper nutrition, including hydration, and aerobic/resistive exercise prescription along with  prescribed medications to achieve blood glucose in normal ranges: Fasting glucose 65-99 mg/dL    Expected Outcomes  Short Term: Participant verbalizes understanding of the signs/symptoms and immediate care of hyper/hypoglycemia, proper foot care and importance of medication, aerobic/resistive exercise and nutrition plan for blood glucose control.;Long Term: Attainment of HbA1C < 7%.    Heart Failure  Yes    Intervention  Provide a combined exercise and nutrition program that is supplemented with education, support and counseling about heart failure. Directed toward relieving symptoms such as shortness of breath, decreased exercise tolerance, and extremity edema.    Expected Outcomes  Improve functional capacity of life;Short term: Attendance in program 2-3 days a week with increased exercise capacity. Reported lower sodium intake. Reported increased fruit and vegetable intake. Reports medication compliance.;Short term: Daily weights obtained and reported for increase. Utilizing diuretic protocols set by physician.;Long term: Adoption of self-care skills and reduction of barriers for early signs and symptoms recognition and intervention leading to self-care maintenance.    Hypertension  Yes    Intervention  Provide education on lifestyle modifcations including regular physical activity/exercise, weight management, moderate sodium restriction and increased consumption of fresh fruit, vegetables, and low fat dairy, alcohol moderation, and smoking cessation.;Monitor prescription use compliance.    Expected Outcomes  Short Term: Continued assessment and intervention until  BP is < 140/16m HG in hypertensive participants. < 130/87mHG in hypertensive participants with diabetes, heart failure or chronic kidney disease.;Long Term: Maintenance of blood pressure at goal levels.    Lipids  Yes    Intervention  Provide education and support for participant on nutrition & aerobic/resistive exercise along with prescribed medications to achieve LDL <7041mHDL >49m9m  Expected Outcomes  Short Term: Participant states understanding of desired cholesterol values and is compliant with medications prescribed. Participant is following exercise prescription and nutrition guidelines.;Long Term: Cholesterol controlled with medications as prescribed, with individualized exercise RX and with personalized nutrition plan. Value goals: LDL < 70mg31mL > 40 mg.    Stress  Yes    Intervention  Offer individual and/or small group education and counseling on adjustment to heart disease, stress management and health-related lifestyle change. Teach and support self-help strategies.;Refer participants experiencing significant psychosocial distress to appropriate mental health specialists for further evaluation and treatment. When possible, include family members and significant others in education/counseling sessions.    Expected Outcomes  Short Term: Participant demonstrates changes in health-related behavior, relaxation and other stress management skills, ability to obtain effective social support, and compliance with psychotropic medications if prescribed.;Long Term: Emotional wellbeing is indicated by absence of clinically significant psychosocial distress or social isolation.       Education:Diabetes - Individual verbal and written instruction to review signs/symptoms of diabetes, desired ranges of glucose level fasting, after meals and with exercise. Acknowledge that pre and post exercise glucose checks will be done for 3 sessions at entry of program.   [facility]  Date  01/15/20  Educator  [facility]  [facility] Southtruction Review Code  1- VerbaUnited States Steel Corporationrstanding      Education: Know Your Numbers and Risk Factors: -Group verbal and written instruction about important numbers in your health.  Discussion of what are risk factors and how they play a role in the disease process.  Review of Cholesterol, Blood Pressure, Diabetes, and BMI and the role they play in your overall health.   Core Components/Risk Factors/Patient Goals Review:    Core Components/Risk Factors/Patient  Goals at Discharge (Final Review):    ITP Comments: ITP Comments    Row Name 01/15/20 1019 02/03/20 0644         ITP Comments  Initial telephone orientation completed. Diagnosis can be found in [facility] 4/6. EP orientation scheduled for 5/3 at 3pm.  30 Day review completed. ITP review done, changes made as directed,and approval shown by signature of  Scientist, research (life sciences).         Comments:

## 2020-02-10 ENCOUNTER — Encounter: Payer: Self-pay | Admitting: Family Medicine

## 2020-02-16 ENCOUNTER — Encounter: Payer: Self-pay | Admitting: *Deleted

## 2020-02-16 DIAGNOSIS — I208 Other forms of angina pectoris: Secondary | ICD-10-CM

## 2020-02-17 ENCOUNTER — Other Ambulatory Visit: Payer: Self-pay

## 2020-02-17 ENCOUNTER — Encounter: Payer: Medicare Other | Attending: Cardiovascular Disease | Admitting: *Deleted

## 2020-02-17 DIAGNOSIS — I509 Heart failure, unspecified: Secondary | ICD-10-CM | POA: Insufficient documentation

## 2020-02-17 DIAGNOSIS — F419 Anxiety disorder, unspecified: Secondary | ICD-10-CM | POA: Insufficient documentation

## 2020-02-17 DIAGNOSIS — G4730 Sleep apnea, unspecified: Secondary | ICD-10-CM | POA: Insufficient documentation

## 2020-02-17 DIAGNOSIS — Z7902 Long term (current) use of antithrombotics/antiplatelets: Secondary | ICD-10-CM | POA: Insufficient documentation

## 2020-02-17 DIAGNOSIS — Z7982 Long term (current) use of aspirin: Secondary | ICD-10-CM | POA: Insufficient documentation

## 2020-02-17 DIAGNOSIS — E785 Hyperlipidemia, unspecified: Secondary | ICD-10-CM | POA: Diagnosis not present

## 2020-02-17 DIAGNOSIS — I252 Old myocardial infarction: Secondary | ICD-10-CM | POA: Diagnosis not present

## 2020-02-17 DIAGNOSIS — E119 Type 2 diabetes mellitus without complications: Secondary | ICD-10-CM | POA: Diagnosis not present

## 2020-02-17 DIAGNOSIS — Z87891 Personal history of nicotine dependence: Secondary | ICD-10-CM | POA: Insufficient documentation

## 2020-02-17 DIAGNOSIS — Z794 Long term (current) use of insulin: Secondary | ICD-10-CM | POA: Diagnosis not present

## 2020-02-17 DIAGNOSIS — Z79899 Other long term (current) drug therapy: Secondary | ICD-10-CM | POA: Diagnosis not present

## 2020-02-17 DIAGNOSIS — I208 Other forms of angina pectoris: Secondary | ICD-10-CM | POA: Insufficient documentation

## 2020-02-17 DIAGNOSIS — F329 Major depressive disorder, single episode, unspecified: Secondary | ICD-10-CM | POA: Diagnosis not present

## 2020-02-17 DIAGNOSIS — I110 Hypertensive heart disease with heart failure: Secondary | ICD-10-CM | POA: Insufficient documentation

## 2020-02-17 DIAGNOSIS — I429 Cardiomyopathy, unspecified: Secondary | ICD-10-CM | POA: Diagnosis not present

## 2020-02-17 DIAGNOSIS — Z7901 Long term (current) use of anticoagulants: Secondary | ICD-10-CM | POA: Diagnosis not present

## 2020-02-17 NOTE — Progress Notes (Signed)
Daily Session Note  Patient Details  Name: YUE GLASHEEN MRN: [id number] Date of Birth: 09-25-1960 Referring Provider:     Cardiac Rehab from 01/18/2020 in [facility]  Referring Provider  Gollan      Encounter Date: 02/17/2020  Check In:      Social History   Tobacco Use  Smoking Status Former Smoker  . Packs/day: 0.25  . Years: 25.00  . Pack years: 6.25  . Types: Cigarettes  . Quit date: 05/19/2018  . Years since quitting: 1.7  Smokeless Tobacco Never Used    Goals Met:  Independence with exercise equipment Exercise tolerated well No report of cardiac concerns or symptoms  Goals Unmet:  Not Applicable  Comments: Pt able to follow exercise prescription today without complaint.  Will continue to monitor for progression.    Dr. Emily Filbert is Medical Director for [place] and [facility].

## 2020-02-18 DIAGNOSIS — E119 Type 2 diabetes mellitus without complications: Secondary | ICD-10-CM | POA: Diagnosis not present

## 2020-02-22 NOTE — Telephone Encounter (Signed)
Brian Williams, can you assist?

## 2020-02-23 DIAGNOSIS — E1165 Type 2 diabetes mellitus with hyperglycemia: Secondary | ICD-10-CM | POA: Diagnosis not present

## 2020-02-23 DIAGNOSIS — M170 Bilateral primary osteoarthritis of knee: Secondary | ICD-10-CM | POA: Diagnosis not present

## 2020-02-23 DIAGNOSIS — I5022 Chronic systolic (congestive) heart failure: Secondary | ICD-10-CM | POA: Diagnosis not present

## 2020-02-23 DIAGNOSIS — I25118 Atherosclerotic heart disease of native coronary artery with other forms of angina pectoris: Secondary | ICD-10-CM | POA: Diagnosis not present

## 2020-02-23 DIAGNOSIS — G4733 Obstructive sleep apnea (adult) (pediatric): Secondary | ICD-10-CM | POA: Diagnosis not present

## 2020-02-23 DIAGNOSIS — I10 Essential (primary) hypertension: Secondary | ICD-10-CM | POA: Diagnosis not present

## 2020-03-02 ENCOUNTER — Encounter: Payer: Self-pay | Admitting: *Deleted

## 2020-03-02 DIAGNOSIS — I208 Other forms of angina pectoris: Secondary | ICD-10-CM

## 2020-03-02 NOTE — Progress Notes (Signed)
Discharge Progress Report  Patient Details  Name: Brian Williams MRN: [id number] Date of Birth: [birth year] Referring Provider:     Cardiac Rehab from 01/18/2020 in [facility] Cardiac and Pulmonary Rehab  Referring Provider Gollan       Number of Visits: 6  Reason for Discharge:  Early Exit:  Personal and Transportation  Smoking History:  Social History   Tobacco Use  Smoking Status Former Smoker  . Packs/day: 0.25  . Years: 25.00  . Pack years: 6.25  . Types: Cigarettes  . Quit date: 05/19/2018  . Years since quitting: 1.7  Smokeless Tobacco Never Used    Diagnosis:  Stable angina (HCC)  ADL UCSD:   Initial Exercise Prescription:  Initial Exercise Prescription - 01/18/20 1600      Date of Initial Exercise RX and Referring Provider   Date 01/18/20    Referring Provider Gollan      Treadmill   MPH 1.8    Grade 0.5    Minutes 15    METs 2.5      NuStep   Level 2    SPM 80    Minutes 15    METs 2.5      REL-XR   Level 2    Speed 50    Minutes 15    METs 2.5      Biostep-RELP   Level 2    SPM 50    Minutes 15    METs 2      Prescription Details   Frequency (times per week) 3    Duration Progress to 30 minutes of continuous aerobic without signs/symptoms of physical distress      Intensity   THRR 40-80% of Max Heartrate 111-145    Ratings of Perceived Exertion 11-13    Perceived Dyspnea 0-4      Resistance Training   Training Prescription Yes    Weight 3 lb    Reps 10-15           Discharge Exercise Prescription (Final Exercise Prescription Changes):  Exercise Prescription Changes - 02/03/20 1000      Response to Exercise   Blood Pressure (Admit) 124/72    Blood Pressure (Exercise) 130/70    Blood Pressure (Exit) 100/58    Heart Rate (Admit) 85 bpm    Heart Rate (Exercise) 114 bpm    Heart Rate (Exit) 82 bpm    Rating of Perceived Exertion (Exercise) 15    Symptoms fourth day exercise    Duration Continue with 30 min of aerobic exercise  without signs/symptoms of physical distress.      Progression   Progression Continue to progress workloads to maintain intensity without signs/symptoms of physical distress.    Average METs 2.65      Resistance Training   Training Prescription Yes    Weight 3 lb    Reps 10-15      Treadmill   MPH 1.8    Grade 0.5    Minutes 15    METs 2.5      REL-XR   Level 2    Speed 50    Minutes 15    METs 2.8           Functional Capacity:  6 Minute Walk    Row Name 01/18/20 1608         6 Minute Walk   Phase Initial     Distance 1030 feet     Walk Time 6 minutes     #  of Rest Breaks 0     MPH 1.95     METS 2.55     RPE 13     Perceived Dyspnea  2     VO2 Peak 8.9     Resting HR 78 bpm     Resting BP 110/56     Resting Oxygen Saturation  96 %     Exercise Oxygen Saturation  during 6 min walk 966 %     Max Ex. HR 116 bpm     Max Ex. BP 136/56     2 Minute Post BP 114/64            Psychological, QOL, Others - Outcomes: PHQ 2/9: Depression screen [facility] 2/9 02/17/2020 01/18/2020 07/23/2019 07/08/2017 07/16/2016  Decreased Interest '1 1 1 3 2  ' Down, Depressed, Hopeless '1 2 3 3 2  ' PHQ - 2 Score '2 3 4 6 4  ' Altered sleeping 0 '1 1 3 3  ' Tired, decreased energy '3 3 3 3 3  ' Change in appetite '3 3 1 ' 0 3  Feeling bad or failure about yourself  '3 3 3 3 3  ' Trouble concentrating '1 1 1 3 1  ' Moving slowly or fidgety/restless '3 3 2 3 ' 0  Suicidal thoughts 1 1 0 0 1  PHQ-9 Score '16 18 15 21 18  ' Difficult doing work/chores Somewhat difficult Extremely dIfficult Somewhat difficult Somewhat difficult Somewhat difficult  Some recent data might be hidden    Quality of Life:  Quality of Life - 01/18/20 0809      Quality of Life   Select Quality of Life      Quality of Life Scores   Health/Function Pre 4.13 %    Socioeconomic Pre 16.12 %    Psych/Spiritual Pre 8.5 %    Family Pre 21.6 %    GLOBAL Pre 10.24 %           Personal Goals: Goals established at orientation with  interventions provided to work toward goal.  Personal Goals and Risk Factors at Admission - 01/18/20 1626      Core Components/Risk Factors/Patient Goals on Admission    Weight Management Obesity;Yes    Intervention Weight Management/Obesity: Establish reasonable short term and long term weight goals.;Weight Management: Provide education and appropriate resources to help participant work on and attain dietary goals.;Weight Management: Develop a combined nutrition and exercise program designed to reach desired caloric intake, while maintaining appropriate intake of nutrient and fiber, sodium and fats, and appropriate energy expenditure required for the weight goal.;Obesity: Provide education and appropriate resources to help participant work on and attain dietary goals.    Admit Weight 292 lb 3.2 oz (132.5 kg)    Goal Weight: Short Term 282 lb (127.9 kg)    Goal Weight: Long Term 272 lb (123.4 kg)    Expected Outcomes Short Term: Continue to assess and modify interventions until short term weight is achieved;Long Term: Adherence to nutrition and physical activity/exercise program aimed toward attainment of established weight goal;Weight Loss: Understanding of general recommendations for a balanced deficit meal plan, which promotes 1-2 lb weight loss per week and includes a negative energy balance of [phone] kcal/d    Intervention Provide education about signs/symptoms and action to take for hypo/hyperglycemia.;Provide education about proper nutrition, including hydration, and aerobic/resistive exercise prescription along with prescribed medications to achieve blood glucose in normal ranges: Fasting glucose 65-99 mg/dL    Expected Outcomes Short Term: Participant verbalizes understanding of the signs/symptoms and immediate care  of hyper/hypoglycemia, proper foot care and importance of medication, aerobic/resistive exercise and nutrition plan for blood glucose control.;Long Term: Attainment of HbA1C < 7%.     Heart Failure Yes    Intervention Provide a combined exercise and nutrition program that is supplemented with education, support and counseling about heart failure. Directed toward relieving symptoms such as shortness of breath, decreased exercise tolerance, and extremity edema.    Expected Outcomes Improve functional capacity of life;Short term: Attendance in program 2-3 days a week with increased exercise capacity. Reported lower sodium intake. Reported increased fruit and vegetable intake. Reports medication compliance.;Short term: Daily weights obtained and reported for increase. Utilizing diuretic protocols set by physician.;Long term: Adoption of self-care skills and reduction of barriers for early signs and symptoms recognition and intervention leading to self-care maintenance.    Hypertension Yes    Intervention Provide education on lifestyle modifcations including regular physical activity/exercise, weight management, moderate sodium restriction and increased consumption of fresh fruit, vegetables, and low fat dairy, alcohol moderation, and smoking cessation.;Monitor prescription use compliance.    Expected Outcomes Short Term: Continued assessment and intervention until BP is < 140/53m HG in hypertensive participants. < 130/864mHG in hypertensive participants with diabetes, heart failure or chronic kidney disease.;Long Term: Maintenance of blood pressure at goal levels.    Lipids Yes    Intervention Provide education and support for participant on nutrition & aerobic/resistive exercise along with prescribed medications to achieve LDL <7013mHDL >106m47m  Expected Outcomes Short Term: Participant states understanding of desired cholesterol values and is compliant with medications prescribed. Participant is following exercise prescription and nutrition guidelines.;Long Term: Cholesterol controlled with medications as prescribed, with individualized exercise RX and with personalized nutrition  plan. Value goals: LDL < 70mg62mL > 40 mg.    Stress Yes    Intervention Offer individual and/or small group education and counseling on adjustment to heart disease, stress management and health-related lifestyle change. Teach and support self-help strategies.;Refer participants experiencing significant psychosocial distress to appropriate mental health specialists for further evaluation and treatment. When possible, include family members and significant others in education/counseling sessions.    Expected Outcomes Short Term: Participant demonstrates changes in health-related behavior, relaxation and other stress management skills, ability to obtain effective social support, and compliance with psychotropic medications if prescribed.;Long Term: Emotional wellbeing is indicated by absence of clinically significant psychosocial distress or social isolation.            Personal Goals Discharge:   Exercise Goals and Review:  Exercise Goals    Row Name 01/18/20 1628             Exercise Goals   Increase Physical Activity Yes       Intervention Provide advice, education, support and counseling about physical activity/exercise needs.;Develop an individualized exercise prescription for aerobic and resistive training based on initial evaluation findings, risk stratification, comorbidities and participant's personal goals.       Expected Outcomes Short Term: Attend rehab on a regular basis to increase amount of physical activity.;Long Term: Add in home exercise to make exercise part of routine and to increase amount of physical activity.;Long Term: Exercising regularly at least 3-5 days a week.       Increase Strength and Stamina Yes       Intervention Provide advice, education, support and counseling about physical activity/exercise needs.;Develop an individualized exercise prescription for aerobic and resistive training based on initial evaluation findings, risk stratification, comorbidities and  participant's personal goals.  Expected Outcomes Short Term: Increase workloads from initial exercise prescription for resistance, speed, and METs.;Short Term: Perform resistance training exercises routinely during rehab and add in resistance training at home;Long Term: Improve cardiorespiratory fitness, muscular endurance and strength as measured by increased METs and functional capacity (6MWT)       Able to understand and use rate of perceived exertion (RPE) scale Yes       Intervention Provide education and explanation on how to use RPE scale       Expected Outcomes Short Term: Able to use RPE daily in rehab to express subjective intensity level;Long Term:  Able to use RPE to guide intensity level when exercising independently       Able to understand and use Dyspnea scale Yes       Intervention Provide education and explanation on how to use Dyspnea scale       Expected Outcomes Short Term: Able to use Dyspnea scale daily in rehab to express subjective sense of shortness of breath during exertion;Long Term: Able to use Dyspnea scale to guide intensity level when exercising independently       Knowledge and understanding of Target Heart Rate Range (THRR) Yes       Intervention Provide education and explanation of THRR including how the numbers were predicted and where they are located for reference       Expected Outcomes Short Term: Able to state/look up THRR;Short Term: Able to use daily as guideline for intensity in rehab;Long Term: Able to use THRR to govern intensity when exercising independently       Able to check pulse independently Yes       Intervention Provide education and demonstration on how to check pulse in carotid and radial arteries.;Review the importance of being able to check your own pulse for safety during independent exercise       Expected Outcomes Short Term: Able to explain why pulse checking is important during independent exercise;Long Term: Able to check pulse  independently and accurately       Understanding of Exercise Prescription Yes       Intervention Provide education, explanation, and written materials on patient's individual exercise prescription       Expected Outcomes Short Term: Able to explain program exercise prescription;Long Term: Able to explain home exercise prescription to exercise independently              Exercise Goals Re-Evaluation:  Exercise Goals Re-Evaluation    Row Name 01/20/20 0940 02/03/20 1040 02/16/20 1628         Exercise Goal Re-Evaluation   Exercise Goals Review Increase Physical Activity;Increase Strength and Stamina;Able to understand and use rate of perceived exertion (RPE) scale;Able to understand and use Dyspnea scale;Knowledge and understanding of Target Heart Rate Range (THRR);Understanding of Exercise Prescription Increase Physical Activity;Increase Strength and Stamina;Able to understand and use rate of perceived exertion (RPE) scale;Understanding of Exercise Prescription --     Comments Reviewed RPE and dyspnea scales, THR and program prescription with pt today.  Pt voiced understanding and was given a copy of goals to take home. Hermann has tolerated exercise well so far.  He will be missing some sessions due to Achilles tendinitis and transportation issues. Out since last review     Expected Outcomes Short: Use RPE daily to regulate intensity. Long: Follow program prescription in THR. Short:  get back to regular attendance Long:  increase MET level --  Nutrition & Weight - Outcomes:  Pre Biometrics - 01/18/20 1627      Pre Biometrics   Height 6' (1.829 m)    Weight 292 lb 3.2 oz (132.5 kg)    BMI (Calculated) 39.62    Single Leg Stand 2 seconds            Nutrition:   Nutrition Discharge:   Education Questionnaire Score:  Knowledge Questionnaire Score - 01/18/20 0809      Knowledge Questionnaire Score   Pre Score 24/26 Education Focus: Nutrition, exercise            Goals reviewed with patient; copy given to patient.

## 2020-03-02 NOTE — Progress Notes (Signed)
Cardiac Individual Treatment Plan  Patient Details  Name: Brian Williams MRN: [id number] Date of Birth: 1961/08/19 Referring Provider:     Cardiac Rehab from 01/18/2020 in [facility] Cardiac and Pulmonary Rehab  Referring Provider Gollan      Initial Encounter Date:    Cardiac Rehab from 01/18/2020 in [facility] Cardiac and Pulmonary Rehab  Date 01/18/20      Visit Diagnosis: Stable angina ([place])  Patient's Home Medications on Admission:  Current Outpatient Medications:  .  aspirin EC 81 MG tablet, Take 81 mg by mouth daily. , Disp: 90 tablet, Rfl:  .  carvedilol (COREG) 25 MG tablet, Take 25 mg by mouth 2 (two) times daily with a meal., Disp: , Rfl:  .  CHANTIX CONTINUING MONTH PAK 1 MG tablet, TAKE 1 TABLET BY MOUTH TWICE A DAY, Disp: 60 tablet, Rfl: 2 .  clopidogrel (PLAVIX) 75 MG tablet, TAKE ONE TABLET BY MOUTH DAILY, Disp: 90 tablet, Rfl: 1 .  dapagliflozin propanediol (FARXIGA) 10 MG TABS tablet, Take 10 mg by mouth daily., Disp: , Rfl:  .  escitalopram (LEXAPRO) 20 MG tablet, TAKE ONE TABLET BY MOUTH DAILY FOR ANXIETY AND DEPRESSION, Disp: 90 tablet, Rfl: 1 .  ezetimibe (ZETIA) 10 MG tablet, Take 1 tablet (10 mg total) by mouth daily., Disp: 90 tablet, Rfl: 3 .  furosemide (LASIX) 40 MG tablet, TAKE TWO TABLETS BY MOUTH EVERY MORNING AND TAKE ONE TABLET BY MOUTH EVERY EVENING. MAY TAKE 1 TABLET IN THE AFTERNOON AS NEEDED, Disp: 360 tablet, Rfl: 2 .  gemfibrozil (LOPID) 600 MG tablet, TAKE ONE TABLET BY MOUTH DAILY, Disp: 90 tablet, Rfl: 0 .  Glucagon 3 MG/DOSE POWD, 1 spray into the left nostril once as needed (hypoglycemia) for up to 1 dose., Disp: , Rfl:  .  hydrOXYzine (ATARAX/VISTARIL) 25 MG tablet, Take 1 tablet (25 mg total) by mouth 2 (two) times daily as needed for anxiety., Disp: 180 tablet, Rfl: 0 .  ibuprofen (ADVIL,MOTRIN) 200 MG tablet, Take 600 mg by mouth every 6 (six) hours as needed., Disp: , Rfl:  .  insulin regular human CONCENTRATED (HUMULIN R) 500 UNIT/ML injection, For  use in pump, for a total of 130 units/day (Patient taking differently: For use in pump, for a total of 170 units/day), Disp: 20 mL, Rfl: 11 .  nitroGLYCERIN (NITRODUR - DOSED IN MG/24 HR) 0.2 mg/hr patch, Apply 1/4 patch to affected area as directed by MD and change every 24 hours., Disp: 30 patch, Rfl: 2 .  rosuvastatin (CRESTOR) 20 MG tablet, TAKE ONE TABLET BY MOUTH EVERY NIGHT AT BEDTIME FOR CHOLESTEROL, Disp: 90 tablet, Rfl: 2 .  sacubitril-valsartan (ENTRESTO) 97-103 MG, Take 1 tablet by mouth 2 (two) times daily., Disp: , Rfl:  .  spironolactone (ALDACTONE) 50 MG tablet, Take 50 mg by mouth daily., Disp: , Rfl:  .  traZODone (DESYREL) 100 MG tablet, TAKE TWO TABLETS BY MOUTH EVERY NIGHT AT BEDTIME, Disp: 180 tablet, Rfl: 2  Past Medical History: Past Medical History:  Diagnosis Date  . AICD (automatic cardioverter/defibrillator) present   . Anxiety    takes Xanax as needed  . Arthritis    back,knees,right shoulder  . Back pain   . Cardiomyopathy ([place])   . CHF (congestive heart failure) (HCC)    takes Lasix and Aldactone daily  . Coronary artery disease    takes Plavix daily  . Depression    takes Zoloft daily  . Diabetes mellitus without complication (HCC)    Humulin R  and [place] daily.Fasting blood sugar runs 140  . GERD (gastroesophageal reflux disease)   . Headache   . History of bronchitis   . History of colon polyps    benign  . History of hiatal hernia   . Hyperlipidemia    takes Fenofibrate,Crestor, and Zetia daily  . Hypertension    takes Entresto and Coreg daily  . MI (myocardial infarction) ([place]) 2001  . Obesity   . Peripheral neuropathy   . Pneumonia    history of-last time about 14 yrs ago  . PONV (postoperative nausea and vomiting)    after knee surgery 25 yrs ago b/p stayed elevated for a while  . Presence of permanent cardiac pacemaker   . Shortness of breath dyspnea    with exertion  . Sleep apnea    uses CPAP nightly  . Ventricular tachycardia  ([place])    s/p RFCA PVCs 2013    Tobacco Use: Social History   Tobacco Use  Smoking Status Former Smoker  . Packs/day: 0.25  . Years: 25.00  . Pack years: 6.25  . Types: Cigarettes  . Quit date: 05/19/2018  . Years since quitting: 1.7  Smokeless Tobacco Never Used    Labs: Recent Review Scientist, physiological    Labs for ITP Cardiac and Pulmonary Rehab Latest Ref Rng & Units 05/24/2016 05/25/2016 07/17/2017 10/31/2017 07/21/2018   Cholestrol 0 - 200 mg/dL - - 128 - 171   LDLDIRECT mg/dL - - 47.0 - 42.0   HDL >39.00 mg/dL - - 24.40(L) - 27.40(L)   Trlycerides 0 - 149 mg/dL - - 497.0 Triglyceride is over 400; calculations on Lipids are invalid.(H) - 952.0 Triglyceride is over 400; calculations on Lipids are invalid.(H)   Hemoglobin A1c - - - - 9.7 -   PHART 7.35 - 7.45 - - - - -   PCO2ART 32 - 48 mmHg - - - - -   HCO3 20.0 - 28.0 mmol/L - - - - -   TCO2 0 - 100 mmol/L 23 25 - - -   ACIDBASEDEF 0.0 - 2.0 mmol/L - - - - -   O2SAT % - - - - -       Exercise Target Goals: Exercise Program Goal: Individual exercise prescription set using results from initial 6 min walk test and THRR while considering  patient's activity barriers and safety.   Exercise Prescription Goal: Initial exercise prescription builds to 30-45 minutes a day of aerobic activity, 2-3 days per week.  Home exercise guidelines will be given to patient during program as part of exercise prescription that the participant will acknowledge.   Education: Aerobic Exercise & Resistance Training: - Gives group verbal and written instruction on the various components of exercise. Focuses on aerobic and resistive training programs and the benefits of this training and how to safely progress through these programs..   Cardiac Rehab from 08/14/2016 in [facility] Cardiac and Pulmonary Rehab  Date 08/14/16  Educator [facility]  Instruction Review Code (retired) 2- meets goals/outcomes      Education: Exercise & Equipment Safety: - Individual verbal  instruction and demonstration of equipment use and safety with use of the equipment.   Cardiac Rehab from 01/18/2020 in [facility] Cardiac and Pulmonary Rehab  Date 01/18/20  Educator AS  Instruction Review Code 1- Verbalizes Understanding      Education: Exercise Physiology & General Exercise Guidelines: - Group verbal and written instruction with models to review the exercise physiology of the cardiovascular system and associated critical  values. Provides general exercise guidelines with specific guidelines to those with heart or lung disease.    Cardiac Rehab from 08/14/2016 in [facility] Cardiac and Pulmonary Rehab  Date 07/26/16  Educator [facility]  Instruction Review Code (retired) 2- meets goals/outcomes      Education: Flexibility, Development worker, international aid, Mind/Body Relaxation: Provides group verbal/written instruction on the benefits of flexibility and balance training, including mind/body exercise modes such as yoga, pilates and tai chi.  Demonstration and skill practice provided.   Activity Barriers & Risk Stratification:  Activity Barriers & Cardiac Risk Stratification - 01/15/20 1006      Activity Barriers & Cardiac Risk Stratification   Activity Barriers Back Problems;Shortness of Breath;Joint Problems;Arthritis;Deconditioning;Chest Pain/Angina    Cardiac Risk Stratification High           6 Minute Walk:  6 Minute Walk    Row Name 01/18/20 1608         6 Minute Walk   Phase Initial     Distance 1030 feet     Walk Time 6 minutes     # of Rest Breaks 0     MPH 1.95     METS 2.55     RPE 13     Perceived Dyspnea  2     VO2 Peak 8.9     Resting HR 78 bpm     Resting BP 110/56     Resting Oxygen Saturation  96 %     Exercise Oxygen Saturation  during 6 min walk 966 %     Max Ex. HR 116 bpm     Max Ex. BP 136/56     2 Minute Post BP 114/64            Oxygen Initial Assessment:   Oxygen Re-Evaluation:   Oxygen Discharge (Final Oxygen Re-Evaluation):   Initial Exercise  Prescription:  Initial Exercise Prescription - 01/18/20 1600      Date of Initial Exercise RX and Referring Provider   Date 01/18/20    Referring Provider Gollan      Treadmill   MPH 1.8    Grade 0.5    Minutes 15    METs 2.5      NuStep   Level 2    SPM 80    Minutes 15    METs 2.5      REL-XR   Level 2    Speed 50    Minutes 15    METs 2.5      Biostep-RELP   Level 2    SPM 50    Minutes 15    METs 2      Prescription Details   Frequency (times per week) 3    Duration Progress to 30 minutes of continuous aerobic without signs/symptoms of physical distress      Intensity   THRR 40-80% of Max Heartrate 111-145    Ratings of Perceived Exertion 11-13    Perceived Dyspnea 0-4      Resistance Training   Training Prescription Yes    Weight 3 lb    Reps 10-15           Perform Capillary Blood Glucose checks as needed.  Exercise Prescription Changes:   Exercise Prescription Changes    Row Name 01/18/20 1600 02/03/20 1000           Response to Exercise   Blood Pressure (Admit) 110/56 124/72      Blood Pressure (Exercise) 136/56 130/70      Blood  Pressure (Exit) 114/64 100/58      Heart Rate (Admit) 78 bpm 85 bpm      Heart Rate (Exercise) 116 bpm 114 bpm      Heart Rate (Exit) 89 bpm 82 bpm      Oxygen Saturation (Admit) 96 % --      Oxygen Saturation (Exercise) 96 % --      Rating of Perceived Exertion (Exercise) 13 15      Perceived Dyspnea (Exercise) 2 --      Symptoms none fourth day exercise      Duration -- Continue with 30 min of aerobic exercise without signs/symptoms of physical distress.        Progression   Progression -- Continue to progress workloads to maintain intensity without signs/symptoms of physical distress.      Average METs -- 2.65        Resistance Training   Training Prescription -- Yes      Weight -- 3 lb      Reps -- 10-15        Treadmill   MPH -- 1.8      Grade -- 0.5      Minutes -- 15      METs -- 2.5         REL-XR   Level -- 2      Speed -- 50      Minutes -- 15      METs -- 2.8             Exercise Comments:   Exercise Comments    Row Name 01/20/20 [phone]           Exercise Comments First full day of exercise!  Patient was oriented to gym and equipment including functions, settings, policies, and procedures.  Patient's individual exercise prescription and treatment plan were reviewed.  All starting workloads were established based on the results of the 6 minute walk test done at initial orientation visit.  The plan for exercise progression was also introduced and progression will be customized based on patient's performance and goals.              Exercise Goals and Review:   Exercise Goals    Row Name 01/18/20 1628             Exercise Goals   Increase Physical Activity Yes       Intervention Provide advice, education, support and counseling about physical activity/exercise needs.;Develop an individualized exercise prescription for aerobic and resistive training based on initial evaluation findings, risk stratification, comorbidities and participant's personal goals.       Expected Outcomes Short Term: Attend rehab on a regular basis to increase amount of physical activity.;Long Term: Add in home exercise to make exercise part of routine and to increase amount of physical activity.;Long Term: Exercising regularly at least 3-5 days a week.       Increase Strength and Stamina Yes       Intervention Provide advice, education, support and counseling about physical activity/exercise needs.;Develop an individualized exercise prescription for aerobic and resistive training based on initial evaluation findings, risk stratification, comorbidities and participant's personal goals.       Expected Outcomes Short Term: Increase workloads from initial exercise prescription for resistance, speed, and METs.;Short Term: Perform resistance training exercises routinely during rehab and add in  resistance training at home;Long Term: Improve cardiorespiratory fitness, muscular endurance and strength as measured by increased METs and functional capacity (6MWT)  Able to understand and use rate of perceived exertion (RPE) scale Yes       Intervention Provide education and explanation on how to use RPE scale       Expected Outcomes Short Term: Able to use RPE daily in rehab to express subjective intensity level;Long Term:  Able to use RPE to guide intensity level when exercising independently       Able to understand and use Dyspnea scale Yes       Intervention Provide education and explanation on how to use Dyspnea scale       Expected Outcomes Short Term: Able to use Dyspnea scale daily in rehab to express subjective sense of shortness of breath during exertion;Long Term: Able to use Dyspnea scale to guide intensity level when exercising independently       Knowledge and understanding of Target Heart Rate Range (THRR) Yes       Intervention Provide education and explanation of THRR including how the numbers were predicted and where they are located for reference       Expected Outcomes Short Term: Able to state/look up THRR;Short Term: Able to use daily as guideline for intensity in rehab;Long Term: Able to use THRR to govern intensity when exercising independently       Able to check pulse independently Yes       Intervention Provide education and demonstration on how to check pulse in carotid and radial arteries.;Review the importance of being able to check your own pulse for safety during independent exercise       Expected Outcomes Short Term: Able to explain why pulse checking is important during independent exercise;Long Term: Able to check pulse independently and accurately       Understanding of Exercise Prescription Yes       Intervention Provide education, explanation, and written materials on patient's individual exercise prescription       Expected Outcomes Short Term: Able to  explain program exercise prescription;Long Term: Able to explain home exercise prescription to exercise independently              Exercise Goals Re-Evaluation :  Exercise Goals Re-Evaluation    Row Name 01/20/20 0940 02/03/20 1040 02/16/20 1628         Exercise Goal Re-Evaluation   Exercise Goals Review Increase Physical Activity;Increase Strength and Stamina;Able to understand and use rate of perceived exertion (RPE) scale;Able to understand and use Dyspnea scale;Knowledge and understanding of Target Heart Rate Range (THRR);Understanding of Exercise Prescription Increase Physical Activity;Increase Strength and Stamina;Able to understand and use rate of perceived exertion (RPE) scale;Understanding of Exercise Prescription --     Comments Reviewed RPE and dyspnea scales, THR and program prescription with pt today.  Pt voiced understanding and was given a copy of goals to take home. Niv has tolerated exercise well so far.  He will be missing some sessions due to Achilles tendinitis and transportation issues. Out since last review     Expected Outcomes Short: Use RPE daily to regulate intensity. Long: Follow program prescription in THR. Short:  get back to regular attendance Long:  increase MET level --            Discharge Exercise Prescription (Final Exercise Prescription Changes):  Exercise Prescription Changes - 02/03/20 1000      Response to Exercise   Blood Pressure (Admit) 124/72    Blood Pressure (Exercise) 130/70    Blood Pressure (Exit) 100/58    Heart Rate (Admit) 85 bpm  Heart Rate (Exercise) 114 bpm    Heart Rate (Exit) 82 bpm    Rating of Perceived Exertion (Exercise) 15    Symptoms fourth day exercise    Duration Continue with 30 min of aerobic exercise without signs/symptoms of physical distress.      Progression   Progression Continue to progress workloads to maintain intensity without signs/symptoms of physical distress.    Average METs 2.65      Resistance  Training   Training Prescription Yes    Weight 3 lb    Reps 10-15      Treadmill   MPH 1.8    Grade 0.5    Minutes 15    METs 2.5      REL-XR   Level 2    Speed 50    Minutes 15    METs 2.8           Nutrition:  Target Goals: Understanding of nutrition guidelines, daily intake of sodium '1500mg'$ , cholesterol '200mg'$ , calories 30% from fat and 7% or less from saturated fats, daily to have 5 or more servings of fruits and vegetables.  Education: Controlling Sodium/Reading Food Labels -Group verbal and written material supporting the discussion of sodium use in heart healthy nutrition. Review and explanation with models, verbal and written materials for utilization of the food label.   Education: General Nutrition Guidelines/Fats and Fiber: -Group instruction provided by verbal, written material, models and posters to present the general guidelines for heart healthy nutrition. Gives an explanation and review of dietary fats and fiber.   Biometrics:  Pre Biometrics - 01/18/20 1627      Pre Biometrics   Height 6' (1.829 m)    Weight 292 lb 3.2 oz (132.5 kg)    BMI (Calculated) 39.62    Single Leg Stand 2 seconds            Nutrition Therapy Plan and Nutrition Goals:   Nutrition Assessments:   MEDIFICTS Score Key:          ?70 Need to make dietary changes          40-70 Heart Healthy Diet         ? 40 Therapeutic Level Cholesterol Diet  Nutrition Goals Re-Evaluation:   Nutrition Goals Discharge (Final Nutrition Goals Re-Evaluation):   Psychosocial: Target Goals: Acknowledge presence or absence of significant depression and/or stress, maximize coping skills, provide positive support system. Participant is able to verbalize types and ability to use techniques and skills needed for reducing stress and depression.   Education: Depression - Provides group verbal and written instruction on the correlation between heart/lung disease and depressed mood, treatment  options, and the stigmas associated with seeking treatment.   Education: Sleep Hygiene -Provides group verbal and written instruction about how sleep can affect your health.  Define sleep hygiene, discuss sleep cycles and impact of sleep habits. Review good sleep hygiene tips.     Education: Stress and Anxiety: - Provides group verbal and written instruction about the health risks of elevated stress and causes of high stress.  Discuss the correlation between heart/lung disease and anxiety and treatment options. Review healthy ways to manage with stress and anxiety.    Initial Review & Psychosocial Screening:  Initial Psych Review & Screening - 01/15/20 1009      Initial Review   Current issues with Current Depression;Current Psychotropic Meds;Current Sleep Concerns;Current Stress Concerns    Source of Stress Concerns Financial;Chronic Illness;Family      Family Dynamics  Good Support System? No    Concerns No support system      Barriers   Psychosocial barriers to participate in program There are no identifiable barriers or psychosocial needs.;The patient should benefit from training in stress management and relaxation.      Screening Interventions   Interventions Encouraged to exercise;To provide support and resources with identified psychosocial needs;Provide feedback about the scores to participant    Expected Outcomes Short Term goal: Utilizing psychosocial counselor, staff and physician to assist with identification of specific Stressors or current issues interfering with healing process. Setting desired goal for each stressor or current issue identified.;Long Term Goal: Stressors or current issues are controlled or eliminated.;Long Term goal: The participant improves quality of Life and PHQ9 Scores as seen by post scores and/or verbalization of changes;Short Term goal: Identification and review with participant of any Quality of Life or Depression concerns found by scoring the  questionnaire.           Quality of Life Scores:   Quality of Life - 01/18/20 0809      Quality of Life   Select Quality of Life      Quality of Life Scores   Health/Function Pre 4.13 %    Socioeconomic Pre 16.12 %    Psych/Spiritual Pre 8.5 %    Family Pre 21.6 %    GLOBAL Pre 10.24 %          Scores of 19 and below usually indicate a poorer quality of life in these areas.  A difference of  2-3 points is a clinically meaningful difference.  A difference of 2-3 points in the total score of the Quality of Life Index has been associated with significant improvement in overall quality of life, self-image, physical symptoms, and general health in studies assessing change in quality of life.  PHQ-9: Recent Review Flowsheet Data    Depression screen [facility] 2/9 02/17/2020 01/18/2020 07/23/2019 07/08/2017 07/16/2016   Decreased Interest _0 Down, Depressed, Hopeless _1 PHQ - 2 Score _2 Altered sleeping 0 _3 Tired, decreased energy _4 Change in appetite _5 0 3   Feeling bad or failure about yourself  _6 Trouble concentrating _7 Moving slowly or fidgety/restless _8 0   Suicidal thoughts 1 1  0 0 1   PHQ-9 Score _9 Difficult doing work/chores Somewhat difficult Extremely dIfficult Somewhat difficult Somewhat difficult Somewhat difficult     Interpretation of Total Score  Total Score Depression Severity:  1-4 = Minimal depression, 5-9 = Mild depression, 10-14 = Moderate depression, 15-19 = Moderately severe depression, 20-27 = Severe depression   Psychosocial Evaluation and Intervention:  Psychosocial Evaluation - 01/15/20 1020      Psychosocial Evaluation & Interventions   Comments Jereme reported a roller coaster of a time with his health. He is in good spirits and is looking forward to coming back to [facility]. He states he does not have a good support system and sometimes his depression and stress  can be a lot, but for the most parts his medications are working. His finances have been a major stressor, but he is glad that Medicare is approved and will pay for this program.  Expected Outcomes Short: attend cardiac rehab for exercise and education. Long; develop positive self care habits    Continue Psychosocial Services  Follow up required by staff           Psychosocial Re-Evaluation:   Psychosocial Discharge (Final Psychosocial Re-Evaluation):   Vocational Rehabilitation: Provide vocational rehab assistance to qualifying candidates.   Vocational Rehab Evaluation & Intervention:  Vocational Rehab - 01/15/20 1008      Initial Vocational Rehab Evaluation & Intervention   Assessment shows need for Vocational Rehabilitation No           Education: Education Goals: Education classes will be provided on a variety of topics geared toward better understanding of heart health and risk factor modification. Participant will state understanding/return demonstration of topics presented as noted by education test scores.  Learning Barriers/Preferences:  Learning Barriers/Preferences - 01/15/20 1020      Learning Barriers/Preferences   Learning Barriers None    Learning Preferences None           General Cardiac Education Topics:  AED/CPR: - Group verbal and written instruction with the use of models to demonstrate the basic use of the AED with the basic ABC's of resuscitation.   Anatomy & Physiology of the Heart: - Group verbal and written instruction and models provide basic cardiac anatomy and physiology, with the coronary electrical and arterial systems. Review of Valvular disease and Heart Failure   Cardiac Procedures: - Group verbal and written instruction to review commonly prescribed medications for heart disease. Reviews the medication, class of the drug, and side effects. Includes the steps to properly store meds and maintain the prescription regimen. (beta  blockers and nitrates)   Cardiac Medications I: - Group verbal and written instruction to review commonly prescribed medications for heart disease. Reviews the medication, class of the drug, and side effects. Includes the steps to properly store meds and maintain the prescription regimen.   Cardiac Medications II: -Group verbal and written instruction to review commonly prescribed medications for heart disease. Reviews the medication, class of the drug, and side effects. (all other drug classes)    Go Sex-Intimacy & Heart Disease, Get SMART - Goal Setting: - Group verbal and written instruction through game format to discuss heart disease and the return to sexual intimacy. Provides group verbal and written material to discuss and apply goal setting through the application of the S.M.A.R.T. Method.   Other Matters of the Heart: - Provides group verbal, written materials and models to describe Stable Angina and Peripheral Artery. Includes description of the disease process and treatment options available to the cardiac patient.   Infection Prevention: - Provides verbal and written material to individual with discussion of infection control including proper hand washing and proper equipment cleaning during exercise session.   Cardiac Rehab from 01/18/2020 in [facility]  Date 01/18/20  Educator AS  Instruction Review Code 1- Verbalizes Understanding      Falls Prevention: - Provides verbal and written material to individual with discussion of falls prevention and safety.   Cardiac Rehab from 01/18/2020 in [facility] Rehab  Date 01/18/20  Educator AS  Instruction Review Code 1- Verbalizes Understanding      Other: -Provides group and verbal instruction on various topics (see comments)   Knowledge Questionnaire Score:  Knowledge Questionnaire Score - 01/18/20 0809      Knowledge Questionnaire Score   Pre Score 24/26 Education Focus: Nutrition,  exercise  Core Components/Risk Factors/Patient Goals at Admission:  Personal Goals and Risk Factors at Admission - 01/18/20 1626      Core Components/Risk Factors/Patient Goals on Admission    Weight Management Obesity;Yes    Intervention Weight Management/Obesity: Establish reasonable short term and long term weight goals.;Weight Management: Provide education and appropriate resources to help participant work on and attain dietary goals.;Weight Management: Develop a combined nutrition and exercise program designed to reach desired caloric intake, while maintaining appropriate intake of nutrient and fiber, sodium and fats, and appropriate energy expenditure required for the weight goal.;Obesity: Provide education and appropriate resources to help participant work on and attain dietary goals.    Admit Weight 292 lb 3.2 oz (132.5 kg)    Goal Weight: Short Term 282 lb (127.9 kg)    Goal Weight: Long Term 272 lb (123.4 kg)    Expected Outcomes Short Term: Continue to assess and modify interventions until short term weight is achieved;Long Term: Adherence to nutrition and physical activity/exercise program aimed toward attainment of established weight goal;Weight Loss: Understanding of general recommendations for a balanced deficit meal plan, which promotes 1-2 lb weight loss per week and includes a negative energy balance of [phone] kcal/d    Intervention Provide education about signs/symptoms and action to take for hypo/hyperglycemia.;Provide education about proper nutrition, including hydration, and aerobic/resistive exercise prescription along with prescribed medications to achieve blood glucose in normal ranges: Fasting glucose 65-99 mg/dL    Expected Outcomes Short Term: Participant verbalizes understanding of the signs/symptoms and immediate care of hyper/hypoglycemia, proper foot care and importance of medication, aerobic/resistive exercise and nutrition plan for blood glucose  control.;Long Term: Attainment of HbA1C < 7%.    Heart Failure Yes    Intervention Provide a combined exercise and nutrition program that is supplemented with education, support and counseling about heart failure. Directed toward relieving symptoms such as shortness of breath, decreased exercise tolerance, and extremity edema.    Expected Outcomes Improve functional capacity of life;Short term: Attendance in program 2-3 days a week with increased exercise capacity. Reported lower sodium intake. Reported increased fruit and vegetable intake. Reports medication compliance.;Short term: Daily weights obtained and reported for increase. Utilizing diuretic protocols set by physician.;Long term: Adoption of self-care skills and reduction of barriers for early signs and symptoms recognition and intervention leading to self-care maintenance.    Hypertension Yes    Intervention Provide education on lifestyle modifcations including regular physical activity/exercise, weight management, moderate sodium restriction and increased consumption of fresh fruit, vegetables, and low fat dairy, alcohol moderation, and smoking cessation.;Monitor prescription use compliance.    Expected Outcomes Short Term: Continued assessment and intervention until BP is < 140/8m HG in hypertensive participants. < 130/854mHG in hypertensive participants with diabetes, heart failure or chronic kidney disease.;Long Term: Maintenance of blood pressure at goal levels.    Lipids Yes    Intervention Provide education and support for participant on nutrition & aerobic/resistive exercise along with prescribed medications to achieve LDL <7073mHDL >103m79m  Expected Outcomes Short Term: Participant states understanding of desired cholesterol values and is compliant with medications prescribed. Participant is following exercise prescription and nutrition guidelines.;Long Term: Cholesterol controlled with medications as prescribed, with individualized  exercise RX and with personalized nutrition plan. Value goals: LDL < 70mg31mL > 40 mg.    Stress Yes    Intervention Offer individual and/or small group education and counseling on adjustment to heart disease, stress management and health-related lifestyle change. Teach and support self-help  strategies.;Refer participants experiencing significant psychosocial distress to appropriate mental health specialists for further evaluation and treatment. When possible, include family members and significant others in education/counseling sessions.    Expected Outcomes Short Term: Participant demonstrates changes in health-related behavior, relaxation and other stress management skills, ability to obtain effective social support, and compliance with psychotropic medications if prescribed.;Long Term: Emotional wellbeing is indicated by absence of clinically significant psychosocial distress or social isolation.           Education:Diabetes - Individual verbal and written instruction to review signs/symptoms of diabetes, desired ranges of glucose level fasting, after meals and with exercise. Acknowledge that pre and post exercise glucose checks will be done for 3 sessions at entry of program.   Cardiac Rehab from 01/15/2020 in [facility] Cardiac and Pul[facility] Re[facility]  Date 01/15/20  Educator [facility]  Instruction Review Code 1- [employer] Understanding      Education: Know Your Numbers and Risk Factors: -Group verbal and written instruction about important numbers in your health.  Discussion of what are risk factors and how they play a role in the disease process.  Review of Cholesterol, Blood Pressure, Diabetes, and BMI and the role they play in your overall health.   Core Components/Risk Factors/Patient Goals Review:    Core Components/Risk Factors/Patient Goals at Discharge (Final Review):    ITP Comments:  ITP Comments    Row Name 01/15/20 1019 02/03/20 0644 02/16/20 1628 03/02/20 0630 03/02/20 0936   ITP  Comments Initial telephone orientation completed. Diagnosis can be found in [facility] 4/6. EP orientation scheduled for 5/3 at 3pm. 30 Day review completed. ITP review done, changes made as directed,and approval shown by signature of  Scientist, research (life sciences). Murry has been out not feeling well. 30 Day review completed. Medical Director ITP review done, changes made as directed, and signed approval by Medical Director. Lonie returned our call and is unable to get transportation at this time with his son's schedule and car sharing. We will discharge him at this time.          Comments: Initial ITP

## 2020-03-02 NOTE — Progress Notes (Signed)
Cardiac Individual Treatment Plan  Patient Details  Name: Brian Williams MRN: [id number] Date of Birth: 1961/08/19 Referring Provider:     Cardiac Rehab from 01/18/2020 in [facility] Cardiac and Pulmonary Rehab  Referring Provider Gollan      Initial Encounter Date:    Cardiac Rehab from 01/18/2020 in [facility] Cardiac and Pulmonary Rehab  Date 01/18/20      Visit Diagnosis: Stable angina ([place])  Patient's Home Medications on Admission:  Current Outpatient Medications:  .  aspirin EC 81 MG tablet, Take 81 mg by mouth daily. , Disp: 90 tablet, Rfl:  .  carvedilol (COREG) 25 MG tablet, Take 25 mg by mouth 2 (two) times daily with a meal., Disp: , Rfl:  .  CHANTIX CONTINUING MONTH PAK 1 MG tablet, TAKE 1 TABLET BY MOUTH TWICE A DAY, Disp: 60 tablet, Rfl: 2 .  clopidogrel (PLAVIX) 75 MG tablet, TAKE ONE TABLET BY MOUTH DAILY, Disp: 90 tablet, Rfl: 1 .  dapagliflozin propanediol (FARXIGA) 10 MG TABS tablet, Take 10 mg by mouth daily., Disp: , Rfl:  .  escitalopram (LEXAPRO) 20 MG tablet, TAKE ONE TABLET BY MOUTH DAILY FOR ANXIETY AND DEPRESSION, Disp: 90 tablet, Rfl: 1 .  ezetimibe (ZETIA) 10 MG tablet, Take 1 tablet (10 mg total) by mouth daily., Disp: 90 tablet, Rfl: 3 .  furosemide (LASIX) 40 MG tablet, TAKE TWO TABLETS BY MOUTH EVERY MORNING AND TAKE ONE TABLET BY MOUTH EVERY EVENING. MAY TAKE 1 TABLET IN THE AFTERNOON AS NEEDED, Disp: 360 tablet, Rfl: 2 .  gemfibrozil (LOPID) 600 MG tablet, TAKE ONE TABLET BY MOUTH DAILY, Disp: 90 tablet, Rfl: 0 .  Glucagon 3 MG/DOSE POWD, 1 spray into the left nostril once as needed (hypoglycemia) for up to 1 dose., Disp: , Rfl:  .  hydrOXYzine (ATARAX/VISTARIL) 25 MG tablet, Take 1 tablet (25 mg total) by mouth 2 (two) times daily as needed for anxiety., Disp: 180 tablet, Rfl: 0 .  ibuprofen (ADVIL,MOTRIN) 200 MG tablet, Take 600 mg by mouth every 6 (six) hours as needed., Disp: , Rfl:  .  insulin regular human CONCENTRATED (HUMULIN R) 500 UNIT/ML injection, For  use in pump, for a total of 130 units/day (Patient taking differently: For use in pump, for a total of 170 units/day), Disp: 20 mL, Rfl: 11 .  nitroGLYCERIN (NITRODUR - DOSED IN MG/24 HR) 0.2 mg/hr patch, Apply 1/4 patch to affected area as directed by MD and change every 24 hours., Disp: 30 patch, Rfl: 2 .  rosuvastatin (CRESTOR) 20 MG tablet, TAKE ONE TABLET BY MOUTH EVERY NIGHT AT BEDTIME FOR CHOLESTEROL, Disp: 90 tablet, Rfl: 2 .  sacubitril-valsartan (ENTRESTO) 97-103 MG, Take 1 tablet by mouth 2 (two) times daily., Disp: , Rfl:  .  spironolactone (ALDACTONE) 50 MG tablet, Take 50 mg by mouth daily., Disp: , Rfl:  .  traZODone (DESYREL) 100 MG tablet, TAKE TWO TABLETS BY MOUTH EVERY NIGHT AT BEDTIME, Disp: 180 tablet, Rfl: 2  Past Medical History: Past Medical History:  Diagnosis Date  . AICD (automatic cardioverter/defibrillator) present   . Anxiety    takes Xanax as needed  . Arthritis    back,knees,right shoulder  . Back pain   . Cardiomyopathy ([place])   . CHF (congestive heart failure) (HCC)    takes Lasix and Aldactone daily  . Coronary artery disease    takes Plavix daily  . Depression    takes Zoloft daily  . Diabetes mellitus without complication (HCC)    Humulin R  and [place] daily.Fasting blood sugar runs 140  . GERD (gastroesophageal reflux disease)   . Headache   . History of bronchitis   . History of colon polyps    benign  . History of hiatal hernia   . Hyperlipidemia    takes Fenofibrate,Crestor, and Zetia daily  . Hypertension    takes Entresto and Coreg daily  . MI (myocardial infarction) ([place]) 2001  . Obesity   . Peripheral neuropathy   . Pneumonia    history of-last time about 14 yrs ago  . PONV (postoperative nausea and vomiting)    after knee surgery 25 yrs ago b/p stayed elevated for a while  . Presence of permanent cardiac pacemaker   . Shortness of breath dyspnea    with exertion  . Sleep apnea    uses CPAP nightly  . Ventricular tachycardia  ([place])    s/p RFCA PVCs 2013    Tobacco Use: Social History   Tobacco Use  Smoking Status Former Smoker  . Packs/day: 0.25  . Years: 25.00  . Pack years: 6.25  . Types: Cigarettes  . Quit date: 05/19/2018  . Years since quitting: 1.7  Smokeless Tobacco Never Used    Labs: Recent Review Scientist, physiological    Labs for ITP Cardiac and Pulmonary Rehab Latest Ref Rng & Units 05/24/2016 05/25/2016 07/17/2017 10/31/2017 07/21/2018   Cholestrol 0 - 200 mg/dL - - 128 - 171   LDLDIRECT mg/dL - - 47.0 - 42.0   HDL >39.00 mg/dL - - 24.40(L) - 27.40(L)   Trlycerides 0 - 149 mg/dL - - 497.0 Triglyceride is over 400; calculations on Lipids are invalid.(H) - 952.0 Triglyceride is over 400; calculations on Lipids are invalid.(H)   Hemoglobin A1c - - - - 9.7 -   PHART 7.35 - 7.45 - - - - -   PCO2ART 32 - 48 mmHg - - - - -   HCO3 20.0 - 28.0 mmol/L - - - - -   TCO2 0 - 100 mmol/L 23 25 - - -   ACIDBASEDEF 0.0 - 2.0 mmol/L - - - - -   O2SAT % - - - - -       Exercise Target Goals: Exercise Program Goal: Individual exercise prescription set using results from initial 6 min walk test and THRR while considering  patient's activity barriers and safety.   Exercise Prescription Goal: Initial exercise prescription builds to 30-45 minutes a day of aerobic activity, 2-3 days per week.  Home exercise guidelines will be given to patient during program as part of exercise prescription that the participant will acknowledge.   Education: Aerobic Exercise & Resistance Training: - Gives group verbal and written instruction on the various components of exercise. Focuses on aerobic and resistive training programs and the benefits of this training and how to safely progress through these programs..   Cardiac Rehab from 08/14/2016 in [facility] Cardiac and Pulmonary Rehab  Date 08/14/16  Educator [facility]  Instruction Review Code (retired) 2- meets goals/outcomes      Education: Exercise & Equipment Safety: - Individual verbal  instruction and demonstration of equipment use and safety with use of the equipment.   Cardiac Rehab from 01/18/2020 in [facility] Cardiac and Pulmonary Rehab  Date 01/18/20  Educator AS  Instruction Review Code 1- Verbalizes Understanding      Education: Exercise Physiology & General Exercise Guidelines: - Group verbal and written instruction with models to review the exercise physiology of the cardiovascular system and associated critical  values. Provides general exercise guidelines with specific guidelines to those with heart or lung disease.    Cardiac Rehab from 08/14/2016 in [facility] Cardiac and Pulmonary Rehab  Date 07/26/16  Educator [facility]  Instruction Review Code (retired) 2- meets goals/outcomes      Education: Flexibility, Insurance underwriter, Mind/Body Relaxation: Provides group verbal/written instruction on the benefits of flexibility and balance training, including mind/body exercise modes such as yoga, pilates and tai chi.  Demonstration and skill practice provided.   Activity Barriers & Risk Stratification:  Activity Barriers & Cardiac Risk Stratification - 01/15/20 1006      Activity Barriers & Cardiac Risk Stratification   Activity Barriers Back Problems;Shortness of Breath;Joint Problems;Arthritis;Deconditioning;Chest Pain/Angina    Cardiac Risk Stratification High           6 Minute Walk:  6 Minute Walk    Row Name 01/18/20 1608         6 Minute Walk   Phase Initial     Distance 1030 feet     Walk Time 6 minutes     # of Rest Breaks 0     MPH 1.95     METS 2.55     RPE 13     Perceived Dyspnea  2     VO2 Peak 8.9     Resting HR 78 bpm     Resting BP 110/56     Resting Oxygen Saturation  96 %     Exercise Oxygen Saturation  during 6 min walk 966 %     Max Ex. HR 116 bpm     Max Ex. BP 136/56     2 Minute Post BP 114/64            Oxygen Initial Assessment:   Oxygen Re-Evaluation:   Oxygen Discharge (Final Oxygen Re-Evaluation):   Initial Exercise  Prescription:  Initial Exercise Prescription - 01/18/20 1600      Date of Initial Exercise RX and Referring Provider   Date 01/18/20    Referring Provider Gollan      Treadmill   MPH 1.8    Grade 0.5    Minutes 15    METs 2.5      NuStep   Level 2    SPM 80    Minutes 15    METs 2.5      REL-XR   Level 2    Speed 50    Minutes 15    METs 2.5      Biostep-RELP   Level 2    SPM 50    Minutes 15    METs 2      Prescription Details   Frequency (times per week) 3    Duration Progress to 30 minutes of continuous aerobic without signs/symptoms of physical distress      Intensity   THRR 40-80% of Max Heartrate 111-145    Ratings of Perceived Exertion 11-13    Perceived Dyspnea 0-4      Resistance Training   Training Prescription Yes    Weight 3 lb    Reps 10-15           Perform Capillary Blood Glucose checks as needed.  Exercise Prescription Changes:  Exercise Prescription Changes    Row Name 01/18/20 1600 02/03/20 1000           Response to Exercise   Blood Pressure (Admit) 110/56 124/72      Blood Pressure (Exercise) 136/56 130/70      Blood Pressure (  Exit) 114/64 100/58      Heart Rate (Admit) 78 bpm 85 bpm      Heart Rate (Exercise) 116 bpm 114 bpm      Heart Rate (Exit) 89 bpm 82 bpm      Oxygen Saturation (Admit) 96 % --      Oxygen Saturation (Exercise) 96 % --      Rating of Perceived Exertion (Exercise) 13 15      Perceived Dyspnea (Exercise) 2 --      Symptoms none fourth day exercise      Duration -- Continue with 30 min of aerobic exercise without signs/symptoms of physical distress.        Progression   Progression -- Continue to progress workloads to maintain intensity without signs/symptoms of physical distress.      Average METs -- 2.65        Resistance Training   Training Prescription -- Yes      Weight -- 3 lb      Reps -- 10-15        Treadmill   MPH -- 1.8      Grade -- 0.5      Minutes -- 15      METs -- 2.5         REL-XR   Level -- 2      Speed -- 50      Minutes -- 15      METs -- 2.8             Exercise Comments:  Exercise Comments    Row Name 01/20/20 [phone]           Exercise Comments First full day of exercise!  Patient was oriented to gym and equipment including functions, settings, policies, and procedures.  Patient's individual exercise prescription and treatment plan were reviewed.  All starting workloads were established based on the results of the 6 minute walk test done at initial orientation visit.  The plan for exercise progression was also introduced and progression will be customized based on patient's performance and goals.              Exercise Goals and Review:  Exercise Goals    Row Name 01/18/20 1628             Exercise Goals   Increase Physical Activity Yes       Intervention Provide advice, education, support and counseling about physical activity/exercise needs.;Develop an individualized exercise prescription for aerobic and resistive training based on initial evaluation findings, risk stratification, comorbidities and participant's personal goals.       Expected Outcomes Short Term: Attend rehab on a regular basis to increase amount of physical activity.;Long Term: Add in home exercise to make exercise part of routine and to increase amount of physical activity.;Long Term: Exercising regularly at least 3-5 days a week.       Increase Strength and Stamina Yes       Intervention Provide advice, education, support and counseling about physical activity/exercise needs.;Develop an individualized exercise prescription for aerobic and resistive training based on initial evaluation findings, risk stratification, comorbidities and participant's personal goals.       Expected Outcomes Short Term: Increase workloads from initial exercise prescription for resistance, speed, and METs.;Short Term: Perform resistance training exercises routinely during rehab and add in resistance  training at home;Long Term: Improve cardiorespiratory fitness, muscular endurance and strength as measured by increased METs and functional capacity (6MWT)  Able to understand and use rate of perceived exertion (RPE) scale Yes       Intervention Provide education and explanation on how to use RPE scale       Expected Outcomes Short Term: Able to use RPE daily in rehab to express subjective intensity level;Long Term:  Able to use RPE to guide intensity level when exercising independently       Able to understand and use Dyspnea scale Yes       Intervention Provide education and explanation on how to use Dyspnea scale       Expected Outcomes Short Term: Able to use Dyspnea scale daily in rehab to express subjective sense of shortness of breath during exertion;Long Term: Able to use Dyspnea scale to guide intensity level when exercising independently       Knowledge and understanding of Target Heart Rate Range (THRR) Yes       Intervention Provide education and explanation of THRR including how the numbers were predicted and where they are located for reference       Expected Outcomes Short Term: Able to state/look up THRR;Short Term: Able to use daily as guideline for intensity in rehab;Long Term: Able to use THRR to govern intensity when exercising independently       Able to check pulse independently Yes       Intervention Provide education and demonstration on how to check pulse in carotid and radial arteries.;Review the importance of being able to check your own pulse for safety during independent exercise       Expected Outcomes Short Term: Able to explain why pulse checking is important during independent exercise;Long Term: Able to check pulse independently and accurately       Understanding of Exercise Prescription Yes       Intervention Provide education, explanation, and written materials on patient's individual exercise prescription       Expected Outcomes Short Term: Able to explain  program exercise prescription;Long Term: Able to explain home exercise prescription to exercise independently              Exercise Goals Re-Evaluation :  Exercise Goals Re-Evaluation    Row Name 01/20/20 0940 02/03/20 1040 02/16/20 1628         Exercise Goal Re-Evaluation   Exercise Goals Review Increase Physical Activity;Increase Strength and Stamina;Able to understand and use rate of perceived exertion (RPE) scale;Able to understand and use Dyspnea scale;Knowledge and understanding of Target Heart Rate Range (THRR);Understanding of Exercise Prescription Increase Physical Activity;Increase Strength and Stamina;Able to understand and use rate of perceived exertion (RPE) scale;Understanding of Exercise Prescription --     Comments Reviewed RPE and dyspnea scales, THR and program prescription with pt today.  Pt voiced understanding and was given a copy of goals to take home. Jiovany has tolerated exercise well so far.  He will be missing some sessions due to Achilles tendinitis and transportation issues. Out since last review     Expected Outcomes Short: Use RPE daily to regulate intensity. Long: Follow program prescription in THR. Short:  get back to regular attendance Long:  increase MET level --            Discharge Exercise Prescription (Final Exercise Prescription Changes):  Exercise Prescription Changes - 02/03/20 1000      Response to Exercise   Blood Pressure (Admit) 124/72    Blood Pressure (Exercise) 130/70    Blood Pressure (Exit) 100/58    Heart Rate (Admit) 85 bpm  Heart Rate (Exercise) 114 bpm    Heart Rate (Exit) 82 bpm    Rating of Perceived Exertion (Exercise) 15    Symptoms fourth day exercise    Duration Continue with 30 min of aerobic exercise without signs/symptoms of physical distress.      Progression   Progression Continue to progress workloads to maintain intensity without signs/symptoms of physical distress.    Average METs 2.65      Resistance Training    Training Prescription Yes    Weight 3 lb    Reps 10-15      Treadmill   MPH 1.8    Grade 0.5    Minutes 15    METs 2.5      REL-XR   Level 2    Speed 50    Minutes 15    METs 2.8           Nutrition:  Target Goals: Understanding of nutrition guidelines, daily intake of sodium <1519m, cholesterol <2039m calories 30% from fat and 7% or less from saturated fats, daily to have 5 or more servings of fruits and vegetables.  Education: Controlling Sodium/Reading Food Labels -Group verbal and written material supporting the discussion of sodium use in heart healthy nutrition. Review and explanation with models, verbal and written materials for utilization of the food label.   Education: General Nutrition Guidelines/Fats and Fiber: -Group instruction provided by verbal, written material, models and posters to present the general guidelines for heart healthy nutrition. Gives an explanation and review of dietary fats and fiber.   Biometrics:  Pre Biometrics - 01/18/20 1627      Pre Biometrics   Height 6' (1.829 m)    Weight 292 lb 3.2 oz (132.5 kg)    BMI (Calculated) 39.62    Single Leg Stand 2 seconds            Nutrition Therapy Plan and Nutrition Goals:   Nutrition Assessments:   MEDIFICTS Score Key:          ?70 Need to make dietary changes          40-70 Heart Healthy Diet         ? 40 Therapeutic Level Cholesterol Diet  Nutrition Goals Re-Evaluation:   Nutrition Goals Discharge (Final Nutrition Goals Re-Evaluation):   Psychosocial: Target Goals: Acknowledge presence or absence of significant depression and/or stress, maximize coping skills, provide positive support system. Participant is able to verbalize types and ability to use techniques and skills needed for reducing stress and depression.   Education: Depression - Provides group verbal and written instruction on the correlation between heart/lung disease and depressed mood, treatment options,  and the stigmas associated with seeking treatment.   Education: Sleep Hygiene -Provides group verbal and written instruction about how sleep can affect your health.  Define sleep hygiene, discuss sleep cycles and impact of sleep habits. Review good sleep hygiene tips.     Education: Stress and Anxiety: - Provides group verbal and written instruction about the health risks of elevated stress and causes of high stress.  Discuss the correlation between heart/lung disease and anxiety and treatment options. Review healthy ways to manage with stress and anxiety.    Initial Review & Psychosocial Screening:  Initial Psych Review & Screening - 01/15/20 1009      Initial Review   Current issues with Current Depression;Current Psychotropic Meds;Current Sleep Concerns;Current Stress Concerns    Source of Stress Concerns Financial;Chronic Illness;Family      Family Dynamics  Good Support System? No    Concerns No support system      Barriers   Psychosocial barriers to participate in program There are no identifiable barriers or psychosocial needs.;The patient should benefit from training in stress management and relaxation.      Screening Interventions   Interventions Encouraged to exercise;To provide support and resources with identified psychosocial needs;Provide feedback about the scores to participant    Expected Outcomes Short Term goal: Utilizing psychosocial counselor, staff and physician to assist with identification of specific Stressors or current issues interfering with healing process. Setting desired goal for each stressor or current issue identified.;Long Term Goal: Stressors or current issues are controlled or eliminated.;Long Term goal: The participant improves quality of Life and PHQ9 Scores as seen by post scores and/or verbalization of changes;Short Term goal: Identification and review with participant of any Quality of Life or Depression concerns found by scoring the  questionnaire.           Quality of Life Scores:   Quality of Life - 01/18/20 0809      Quality of Life   Select Quality of Life      Quality of Life Scores   Health/Function Pre 4.13 %    Socioeconomic Pre 16.12 %    Psych/Spiritual Pre 8.5 %    Family Pre 21.6 %    GLOBAL Pre 10.24 %          Scores of 19 and below usually indicate a poorer quality of life in these areas.  A difference of  2-3 points is a clinically meaningful difference.  A difference of 2-3 points in the total score of the Quality of Life Index has been associated with significant improvement in overall quality of life, self-image, physical symptoms, and general health in studies assessing change in quality of life.  PHQ-9: Recent Review Flowsheet Data    Depression screen [facility] 2/9 02/17/2020 01/18/2020 07/23/2019 07/08/2017 07/16/2016   Decreased Interest _0 Down, Depressed, Hopeless _1 PHQ - 2 Score _2 Altered sleeping 0 _3 Tired, decreased energy _4 Change in appetite _5 0 3   Feeling bad or failure about yourself  _6 Trouble concentrating _7 Moving slowly or fidgety/restless _8 0   Suicidal thoughts 1 1  0 0 1   PHQ-9 Score _9 Difficult doing work/chores Somewhat difficult Extremely dIfficult Somewhat difficult Somewhat difficult Somewhat difficult     Interpretation of Total Score  Total Score Depression Severity:  1-4 = Minimal depression, 5-9 = Mild depression, 10-14 = Moderate depression, 15-19 = Moderately severe depression, 20-27 = Severe depression   Psychosocial Evaluation and Intervention:  Psychosocial Evaluation - 01/15/20 1020      Psychosocial Evaluation & Interventions   Comments Jereme reported a roller coaster of a time with his health. He is in good spirits and is looking forward to coming back to [facility]. He states he does not have a good support system and sometimes his depression and stress  can be a lot, but for the most parts his medications are working. His finances have been a major stressor, but he is glad that Medicare is approved and will pay for this program.  Expected Outcomes Short: attend cardiac rehab for exercise and education. Long; develop positive self care habits    Continue Psychosocial Services  Follow up required by staff           Psychosocial Re-Evaluation:   Psychosocial Discharge (Final Psychosocial Re-Evaluation):   Vocational Rehabilitation: Provide vocational rehab assistance to qualifying candidates.   Vocational Rehab Evaluation & Intervention:  Vocational Rehab - 01/15/20 1008      Initial Vocational Rehab Evaluation & Intervention   Assessment shows need for Vocational Rehabilitation No           Education: Education Goals: Education classes will be provided on a variety of topics geared toward better understanding of heart health and risk factor modification. Participant will state understanding/return demonstration of topics presented as noted by education test scores.  Learning Barriers/Preferences:  Learning Barriers/Preferences - 01/15/20 1020      Learning Barriers/Preferences   Learning Barriers None    Learning Preferences None           General Cardiac Education Topics:  AED/CPR: - Group verbal and written instruction with the use of models to demonstrate the basic use of the AED with the basic ABC's of resuscitation.   Anatomy & Physiology of the Heart: - Group verbal and written instruction and models provide basic cardiac anatomy and physiology, with the coronary electrical and arterial systems. Review of Valvular disease and Heart Failure   Cardiac Procedures: - Group verbal and written instruction to review commonly prescribed medications for heart disease. Reviews the medication, class of the drug, and side effects. Includes the steps to properly store meds and maintain the prescription regimen. (beta  blockers and nitrates)   Cardiac Medications I: - Group verbal and written instruction to review commonly prescribed medications for heart disease. Reviews the medication, class of the drug, and side effects. Includes the steps to properly store meds and maintain the prescription regimen.   Cardiac Medications II: -Group verbal and written instruction to review commonly prescribed medications for heart disease. Reviews the medication, class of the drug, and side effects. (all other drug classes)    Go Sex-Intimacy & Heart Disease, Get SMART - Goal Setting: - Group verbal and written instruction through game format to discuss heart disease and the return to sexual intimacy. Provides group verbal and written material to discuss and apply goal setting through the application of the S.M.A.R.T. Method.   Other Matters of the Heart: - Provides group verbal, written materials and models to describe Stable Angina and Peripheral Artery. Includes description of the disease process and treatment options available to the cardiac patient.   Infection Prevention: - Provides verbal and written material to individual with discussion of infection control including proper hand washing and proper equipment cleaning during exercise session.   Cardiac Rehab from 01/18/2020 in [facility] Cardiac and Pulmonary Rehab  Date 01/18/20  Educator AS  Instruction Review Code 1- Verbalizes Understanding      Falls Prevention: - Provides verbal and written material to individual with discussion of falls prevention and safety.   Cardiac Rehab from 01/18/2020 in [facility] Cardiac and Pulmonary Rehab  Date 01/18/20  Educator AS  Instruction Review Code 1- Verbalizes Understanding      Other: -Provides group and verbal instruction on various topics (see comments)   Knowledge Questionnaire Score:  Knowledge Questionnaire Score - 01/18/20 0809      Knowledge Questionnaire Score   Pre Score 24/26 Education Focus: Nutrition,  exercise  Core Components/Risk Factors/Patient Goals at Admission:  Personal Goals and Risk Factors at Admission - 01/18/20 1626      Core Components/Risk Factors/Patient Goals on Admission    Weight Management Obesity;Yes    Intervention Weight Management/Obesity: Establish reasonable short term and long term weight goals.;Weight Management: Provide education and appropriate resources to help participant work on and attain dietary goals.;Weight Management: Develop a combined nutrition and exercise program designed to reach desired caloric intake, while maintaining appropriate intake of nutrient and fiber, sodium and fats, and appropriate energy expenditure required for the weight goal.;Obesity: Provide education and appropriate resources to help participant work on and attain dietary goals.    Admit Weight 292 lb 3.2 oz (132.5 kg)    Goal Weight: Short Term 282 lb (127.9 kg)    Goal Weight: Long Term 272 lb (123.4 kg)    Expected Outcomes Short Term: Continue to assess and modify interventions until short term weight is achieved;Long Term: Adherence to nutrition and physical activity/exercise program aimed toward attainment of established weight goal;Weight Loss: Understanding of general recommendations for a balanced deficit meal plan, which promotes 1-2 lb weight loss per week and includes a negative energy balance of [phone] kcal/d    Intervention Provide education about signs/symptoms and action to take for hypo/hyperglycemia.;Provide education about proper nutrition, including hydration, and aerobic/resistive exercise prescription along with prescribed medications to achieve blood glucose in normal ranges: Fasting glucose 65-99 mg/dL    Expected Outcomes Short Term: Participant verbalizes understanding of the signs/symptoms and immediate care of hyper/hypoglycemia, proper foot care and importance of medication, aerobic/resistive exercise and nutrition plan for blood glucose  control.;Long Term: Attainment of HbA1C < 7%.    Heart Failure Yes    Intervention Provide a combined exercise and nutrition program that is supplemented with education, support and counseling about heart failure. Directed toward relieving symptoms such as shortness of breath, decreased exercise tolerance, and extremity edema.    Expected Outcomes Improve functional capacity of life;Short term: Attendance in program 2-3 days a week with increased exercise capacity. Reported lower sodium intake. Reported increased fruit and vegetable intake. Reports medication compliance.;Short term: Daily weights obtained and reported for increase. Utilizing diuretic protocols set by physician.;Long term: Adoption of self-care skills and reduction of barriers for early signs and symptoms recognition and intervention leading to self-care maintenance.    Hypertension Yes    Intervention Provide education on lifestyle modifcations including regular physical activity/exercise, weight management, moderate sodium restriction and increased consumption of fresh fruit, vegetables, and low fat dairy, alcohol moderation, and smoking cessation.;Monitor prescription use compliance.    Expected Outcomes Short Term: Continued assessment and intervention until BP is < 140/8m HG in hypertensive participants. < 130/854mHG in hypertensive participants with diabetes, heart failure or chronic kidney disease.;Long Term: Maintenance of blood pressure at goal levels.    Lipids Yes    Intervention Provide education and support for participant on nutrition & aerobic/resistive exercise along with prescribed medications to achieve LDL <7073mHDL >103m79m  Expected Outcomes Short Term: Participant states understanding of desired cholesterol values and is compliant with medications prescribed. Participant is following exercise prescription and nutrition guidelines.;Long Term: Cholesterol controlled with medications as prescribed, with individualized  exercise RX and with personalized nutrition plan. Value goals: LDL < 70mg31mL > 40 mg.    Stress Yes    Intervention Offer individual and/or small group education and counseling on adjustment to heart disease, stress management and health-related lifestyle change. Teach and support self-help  strategies.;Refer participants experiencing significant psychosocial distress to appropriate mental health specialists for further evaluation and treatment. When possible, include family members and significant others in education/counseling sessions.    Expected Outcomes Short Term: Participant demonstrates changes in health-related behavior, relaxation and other stress management skills, ability to obtain effective social support, and compliance with psychotropic medications if prescribed.;Long Term: Emotional wellbeing is indicated by absence of clinically significant psychosocial distress or social isolation.           Education:Diabetes - Individual verbal and written instruction to review signs/symptoms of diabetes, desired ranges of glucose level fasting, after meals and with exercise. Acknowledge that pre and post exercise glucose checks will be done for 3 sessions at entry of program.   Cardiac Rehab from 01/15/2020 in [facility] Cardiac and Pulmonary Rehab  Date 01/15/20  Educator [facility]  Instruction Review Code 1- [employer] Understanding      Education: Know Your Numbers and Risk Factors: -Group verbal and written instruction about important numbers in your health.  Discussion of what are risk factors and how they play a role in the disease process.  Review of Cholesterol, Blood Pressure, Diabetes, and BMI and the role they play in your overall health.   Core Components/Risk Factors/Patient Goals Review:    Core Components/Risk Factors/Patient Goals at Discharge (Final Review):    ITP Comments:  ITP Comments    Row Name 01/15/20 1019 02/03/20 0644 02/16/20 1628 03/02/20 0630     ITP Comments  Initial telephone orientation completed. Diagnosis can be found in [facility] 4/6. EP orientation scheduled for 5/3 at 3pm. 30 Day review completed. ITP review done, changes made as directed,and approval shown by signature of  Scientist, research (life sciences). [place] has been out not feeling well. 30 Day review completed. Medical Director ITP review done, changes made as directed, and signed approval by Medical Director.           Comments: 30 Day review completed. Medical Director ITP review done, changes made as directed, and signed approval by Medical Director.

## 2020-03-08 ENCOUNTER — Telehealth: Payer: Self-pay | Admitting: Primary Care

## 2020-03-08 DIAGNOSIS — E785 Hyperlipidemia, unspecified: Secondary | ICD-10-CM

## 2020-03-08 DIAGNOSIS — E7800 Pure hypercholesterolemia, unspecified: Secondary | ICD-10-CM

## 2020-03-08 DIAGNOSIS — F331 Major depressive disorder, recurrent, moderate: Secondary | ICD-10-CM

## 2020-03-08 DIAGNOSIS — F411 Generalized anxiety disorder: Secondary | ICD-10-CM

## 2020-03-08 NOTE — Telephone Encounter (Signed)
Patient called in regards to having his prescriptions sent to [facility] He stated he has requested these before and the pharmacy has and nothing has been done  I asked for the medication names and he stated there are 13 that needed to be sent so he needed them all, and there were to many to name   Please advise

## 2020-03-09 ENCOUNTER — Other Ambulatory Visit: Payer: Self-pay

## 2020-03-09 MED ORDER — EZETIMIBE 10 MG PO TABS: 10.0000 mg | ORAL_TABLET | Freq: Every day | ORAL | 0 refills | Status: DC

## 2020-03-09 MED ORDER — ESCITALOPRAM OXALATE 20 MG PO TABS: ORAL_TABLET | ORAL | 1 refills | Status: DC

## 2020-03-09 MED ORDER — ROSUVASTATIN CALCIUM 20 MG PO TABS: ORAL_TABLET | ORAL | 1 refills | Status: DC

## 2020-03-09 MED ORDER — CARVEDILOL 25 MG PO TABS: 25.0000 mg | ORAL_TABLET | Freq: Two times a day (BID) | ORAL | 0 refills | Status: DC

## 2020-03-09 MED ORDER — GEMFIBROZIL 600 MG PO TABS: 600.0000 mg | ORAL_TABLET | Freq: Every day | ORAL | 1 refills | Status: DC

## 2020-03-09 MED ORDER — TRAZODONE HCL 100 MG PO TABS: ORAL_TABLET | ORAL | 1 refills | Status: DC

## 2020-03-09 NOTE — Telephone Encounter (Signed)
Pt called back to check on status of refills.  Pt changed pharmacy for some of his meds and will need the following refills sent to [facility]  Furosemide 40mg  escitalopram 20mg  Gemfibrozil 600mg  Rosuvastatin 35mng trazadone 100mg   spirinolactone 50mg   Pt reports he is currently running low on most mediations. Advised a msg would be sent but refills can take up to 72 hours. Pt verbalized understanding.

## 2020-03-09 NOTE — Telephone Encounter (Signed)
*  STAT* If patient is at the pharmacy, call can be transferred to refill team.   1. Which medications need to be refilled? (please list name of each medication and dose if known) Carvedilol, Zetia  2. Which pharmacy/location (including street and city if local pharmacy) is medication to be sent to? [facility]  3. Do they need a 30 day or 90 day supply? [place]

## 2020-03-09 NOTE — Telephone Encounter (Signed)
*  STAT* If patient is at the pharmacy, call can be transferred to refill team.   1. Which medications need to be refilled? (please list name of each medication and dose if known) Carvedilol, Zetia  2. Which pharmacy/location (including street and city if local pharmacy) is medication to be sent to? [facility]  3. Do they need a 30 day or 90 day supply? [place]

## 2020-03-09 NOTE — Telephone Encounter (Signed)
Spoken to patient and refilled as requested to mail order. However, notified patient that he will need to contact cardiology for furosemide and spironolactone. Patient verbalized understanding

## 2020-03-16 DIAGNOSIS — M170 Bilateral primary osteoarthritis of knee: Secondary | ICD-10-CM | POA: Diagnosis not present

## 2020-03-16 DIAGNOSIS — H9203 Otalgia, bilateral: Secondary | ICD-10-CM | POA: Diagnosis not present

## 2020-03-16 DIAGNOSIS — H6123 Impacted cerumen, bilateral: Secondary | ICD-10-CM | POA: Diagnosis not present

## 2020-04-05 ENCOUNTER — Ambulatory Visit (INDEPENDENT_AMBULATORY_CARE_PROVIDER_SITE_OTHER): Payer: Medicare Other | Admitting: Internal Medicine

## 2020-04-05 ENCOUNTER — Other Ambulatory Visit: Payer: Self-pay

## 2020-04-05 ENCOUNTER — Encounter: Payer: Self-pay | Admitting: Internal Medicine

## 2020-04-05 VITALS — BP 114/64 | HR 77 | Ht 72.0 in | Wt 286.0 lb

## 2020-04-05 DIAGNOSIS — I255 Ischemic cardiomyopathy: Secondary | ICD-10-CM | POA: Diagnosis not present

## 2020-04-05 DIAGNOSIS — Z95810 Presence of automatic (implantable) cardiac defibrillator: Secondary | ICD-10-CM | POA: Diagnosis not present

## 2020-04-05 DIAGNOSIS — I5022 Chronic systolic (congestive) heart failure: Secondary | ICD-10-CM

## 2020-04-05 DIAGNOSIS — Z79899 Other long term (current) drug therapy: Secondary | ICD-10-CM | POA: Diagnosis not present

## 2020-04-05 DIAGNOSIS — I493 Ventricular premature depolarization: Secondary | ICD-10-CM | POA: Diagnosis not present

## 2020-04-05 NOTE — Progress Notes (Signed)
Patient Care Team: Pleas Koch, NP as PCP - General (Internal Medicine) Minna Merritts, MD as Consulting Physician (Cardiology) Radene Knee Ladell Pier, MD as Consulting Physician (Cardiology)   HPI  Brian Williams is a 59 y.o. male Seen in follow-up for ICD implanted for secondary prevention after holter demonstrated VT.  Also with PVCs, both for which he has undergone ablation 2013 @ [place]  . His device reached ERI and underwent generator replacement 9/16. He has an underlying history of ischemic heart disease w prior CABG 2017  He has had multiple caths and PCI  Followed previously at [facility] and then at [facility] and now at [facility].  Notes reviewed from spring 2017.  Chronic shortness of breath.  Edema intermittent.  No real chest pain.  [facility] notes raised the possibility of ranolazine.   Rx prev with amio  Subsequently discontinued    He underwent catheterization and bypass X 4 9/17 by Dr. Buckner Malta   Has STOPPED SMOKING and with this he has been eating more       DATE TEST EF   2015 Myoview 35 %   9/17 Echo([facility])  35%   9/17 Cath   Severe proximal LAD disease (ISR) Severe proximal and mid RAC disease Severe OM2 disease  Severe proximal diagonal #1 disease Moderate to severe left main disease  11/17 Myoview   28 % No Ischemia  6/19 Echo ([facility]) 25-30%   9/20 PET-stress 20-25% Fixed defects    Date Cr K Mg Hgb  11/17  0./86 4.1 2.5 8.7>>12.2  9/18  0.8 3.7     11/19 0.99 4.1    5/20 0.83 4.0 2.1 11.6  6/21 0.80 Hemolyzed         Device History: ICD implanted  2009 generator replacement 2016 History of appropriate therapy: No History of AAD therapy: Yes    Past Medical History:  Diagnosis Date  . AICD (automatic cardioverter/defibrillator) present   . Anxiety    takes Xanax as needed  . Arthritis    back,knees,right shoulder  . Back pain   . Cardiomyopathy ([place])   . CHF (congestive heart failure) (HCC)    takes Lasix and Aldactone daily  . Coronary artery disease     takes Plavix daily  . Depression    takes Zoloft daily  . Diabetes mellitus without complication (HCC)    Humulin R and Farxiga daily.Fasting blood sugar runs 140  . GERD (gastroesophageal reflux disease)   . Headache   . History of bronchitis   . History of colon polyps    benign  . History of hiatal hernia   . Hyperlipidemia    takes Fenofibrate,Crestor, and Zetia daily  . Hypertension    takes Entresto and Coreg daily  . MI (myocardial infarction) ([place]) 2001  . Obesity   . Peripheral neuropathy   . Pneumonia    history of-last time about 14 yrs ago  . PONV (postoperative nausea and vomiting)    after knee surgery 25 yrs ago b/p stayed elevated for a while  . Presence of permanent cardiac pacemaker   . Shortness of breath dyspnea    with exertion  . Sleep apnea    uses CPAP nightly  . Ventricular tachycardia ([place])    s/p RFCA PVCs 2013    Past Surgical History:  Procedure Laterality Date  . BACK SURGERY    . CARDIAC CATHETERIZATION    . CARDIAC CATHETERIZATION Left 05/10/2016   Procedure: Left Heart  Cath and Coronary Angiography;  Surgeon: Minna Merritts, MD;  Location: [facility];  Service: Cardiovascular;  Laterality: Left;  . CARDIAC DEFIBRILLATOR PLACEMENT  10/16/2007   ICD Model number 2207-36 serial number [id number]  . CARDIAC ELECTROPHYSIOLOGY STUDY AND ABLATION    . CHOLECYSTECTOMY  2001  . COLONOSCOPY    . COLONOSCOPY WITH PROPOFOL N/A 08/28/2018   Procedure: COLONOSCOPY WITH PROPOFOL;  Surgeon: Jonathon Bellows, MD;  Location: [facility] ENDOSCOPY;  Service: Gastroenterology;  Laterality: N/A;  . CORONARY ANGIOPLASTY WITH STENT PLACEMENT     7 stents  . CORONARY ARTERY BYPASS GRAFT N/A 05/24/2016   Procedure: CORONARY ARTERY BYPASS GRAFTING (CABG) x four , using left internal mammary artery and left leg greater saphenous vein harvested endoscopically;  Surgeon: Gaye Pollack, MD;  Location: [facility] OR;  Service: Open Heart Surgery;  Laterality: N/A;  . EP  IMPLANTABLE DEVICE N/A 06/16/2015   Procedure: ICD Generator Changeout;  Surgeon: Deboraha Sprang, MD;  Location: [facility];  Service: Cardiovascular;  Laterality: N/A;  . ESOPHAGOGASTRODUODENOSCOPY    . INSERT / REPLACE / REMOVE PACEMAKER    . KNEE SURGERY     bilateral knee   . [name] WITHOUT CARDIOVERSION N/A 05/24/2016   Procedure: TRANSESOPHAGEAL ECHOCARDIOGRAM ([name]);  Surgeon: Gaye Pollack, MD;  Location: [facility];  Service: Open Heart Surgery;  Laterality: N/A;  . VASECTOMY      Current Outpatient Medications  Medication Sig Dispense Refill  . aspirin EC 81 MG tablet Take 81 mg by mouth daily.  90 tablet   . carvedilol (COREG) 25 MG tablet Take 1 tablet (25 mg total) by mouth 2 (two) times daily with a meal. 180 tablet 0  . clopidogrel (PLAVIX) 75 MG tablet TAKE ONE TABLET BY MOUTH DAILY 90 tablet 1  . dapagliflozin propanediol (FARXIGA) 10 MG TABS tablet Take 10 mg by mouth daily.    Marland Kitchen escitalopram (LEXAPRO) 20 MG tablet TAKE ONE TABLET BY MOUTH DAILY FOR ANXIETY AND DEPRESSION 90 tablet 1  . ezetimibe (ZETIA) 10 MG tablet Take 1 tablet (10 mg total) by mouth daily. 90 tablet 0  . furosemide (LASIX) 40 MG tablet TAKE TWO TABLETS BY MOUTH EVERY MORNING AND TAKE ONE TABLET BY MOUTH EVERY EVENING. MAY TAKE 1 TABLET IN THE AFTERNOON AS NEEDED 360 tablet 2  . gemfibrozil (LOPID) 600 MG tablet Take 1 tablet (600 mg total) by mouth daily. 90 tablet 1  . hydrOXYzine (ATARAX/VISTARIL) 25 MG tablet Take 1 tablet (25 mg total) by mouth 2 (two) times daily as needed for anxiety. 180 tablet 0  . ibuprofen (ADVIL,MOTRIN) 200 MG tablet Take 600 mg by mouth every 6 (six) hours as needed.    . insulin regular human CONCENTRATED (HUMULIN R) 500 UNIT/ML injection For use in pump, for a total of 130 units/day (Patient taking differently: For use in pump, for a total of 170 units/day) 20 mL 11  . rosuvastatin (CRESTOR) 20 MG tablet TAKE ONE TABLET BY MOUTH EVERY NIGHT AT BEDTIME FOR CHOLESTEROL 90 tablet  1  . sacubitril-valsartan (ENTRESTO) 97-103 MG Take 1 tablet by mouth 2 (two) times daily.    Marland Kitchen spironolactone (ALDACTONE) 50 MG tablet Take 50 mg by mouth daily.    . traZODone (DESYREL) 100 MG tablet TAKE TWO TABLETS BY MOUTH EVERY NIGHT AT BEDTIME 180 tablet 1   No current facility-administered medications for this visit.    Allergies  Allergen Reactions  . Morphine And Related Nausea And Vomiting  Morphine only  . Naproxen Other (See Comments)    Causes hyperactivity      Review of Systems negative except from HPI and PMH  Physical Exam BP 114/64 (BP Location: Right Arm, Patient Position: Sitting, Cuff Size: Normal)   Pulse 77   Ht 6' (1.829 m)   Wt 286 lb (129.7 kg)   SpO2 98%   BMI 38.79 kg/m .  Well developed and Morbidly obese in no acute distress HENT normal Neck supple   Clear Device pocket well healed; without hematoma or erythema.  There is no tethering  Regular rate and rhythm, no  gallop No murmur Abd-soft with active BS No Clubbing cyanosis 1= edema Skin-warm and dry A & Oriented  Grossly normal sensory and motor function  ECG  sinus at 77 interval 21/09/39 Nonspecific ST-T changes     Assessment and  Plan  Ischemic cardiomyopathy  Implantable defibrillator-[employer]    History of inappropriate therapy  High Risk Medication Surveillance  Tobacco use>>stopped!!!  Congestive heart failure-chronic-systolic  High Risk Medication Surveillance spironolactone  PVCs      Without symptoms of ischemia  Chronic dyspnea.  Euvolemic.  Continue current medications.  Managed at [facility].  No interval ventricular tachycardia.  No significant atrial arrhythmias.  Last blood sample was hemolyzed.  We will recheck potassium.

## 2020-04-05 NOTE — Patient Instructions (Addendum)
Medication Instructions:  - Your physician recommends that you continue on your current medications as directed. Please refer to the Current Medication list given to you today.  *If you need a refill on your cardiac medications before your next appointment, please call your pharmacy*   Lab Work: - Your physician recommends that you have lab work today: BMP  If you have labs (blood work) drawn today and your tests are completely normal, you will receive your results only by: . MyChart Message (if you have MyChart) OR . A paper copy in the mail If you have any lab test that is abnormal or we need to change your treatment, we will call you to review the results.   Testing/Procedures: - none ordered   Follow-Up: At [facility], you and your health needs are our priority.  As part of our continuing mission to provide you with exceptional heart care, we have created designated Provider Care Teams.  These Care Teams include your primary Cardiologist (physician) and Advanced Practice Providers (APPs -  Physician Assistants and Nurse Practitioners) who all work together to provide you with the care you need, when you need it.  We recommend signing up for the patient portal called "MyChart".  Sign up information is provided on this After Visit Summary.  MyChart is used to connect with patients for Virtual Visits (Telemedicine).  Patients are able to view lab/test results, encounter notes, upcoming appointments, etc.  Non-urgent messages can be sent to your provider as well.   To learn more about what you can do with MyChart, go to [email or web address].    Your next appointment:   1 year(s)  The format for your next appointment:   In Person  Provider:   Steven Klein, MD   Other Instructions n/a  

## 2020-04-06 LAB — BASIC METABOLIC PANEL
BUN/Creatinine Ratio: 23 — ABNORMAL HIGH (ref 9–20)
BUN: 19 mg/dL (ref 6–24)
CO2: 17 mmol/L — ABNORMAL LOW (ref 20–29)
Calcium: 9.7 mg/dL (ref 8.7–10.2)
Chloride: 98 mmol/L (ref 96–106)
Creatinine, Ser: 0.83 mg/dL (ref 0.76–1.27)
GFR calc Af Amer: 111 mL/min/{1.73_m2} (ref >59)
GFR calc non Af Amer: 96 mL/min/{1.73_m2} (ref >59)
Glucose: 198 mg/dL — ABNORMAL HIGH (ref 65–99)
Potassium: 3.9 mmol/L (ref 3.5–5.2)
Sodium: 137 mmol/L (ref 134–144)

## 2020-04-11 ENCOUNTER — Ambulatory Visit (INDEPENDENT_AMBULATORY_CARE_PROVIDER_SITE_OTHER): Payer: Medicare Other | Admitting: *Deleted

## 2020-04-11 DIAGNOSIS — I255 Ischemic cardiomyopathy: Secondary | ICD-10-CM

## 2020-04-11 LAB — CUP PACEART REMOTE DEVICE CHECK
Battery Remaining Longevity: 53 mo
Battery Remaining Percentage: 57 %
Battery Voltage: 2.93 V
Brady Statistic AP VP Percent: 1 %
Brady Statistic AP VS Percent: 1 %
Brady Statistic AS VP Percent: 9 %
Brady Statistic AS VS Percent: 91 %
Brady Statistic RA Percent Paced: 1 %
Brady Statistic RV Percent Paced: 9.1 %
Date Time Interrogation Session: 20210725141756
HighPow Impedance: 44 Ohm
HighPow Impedance: 44 Ohm
Implantable Lead Implant Date: 20160929
Implantable Lead Implant Date: 20160929
Implantable Lead Location: 753859
Implantable Lead Location: 753860
Implantable Lead Model: 7071
Implantable Pulse Generator Implant Date: 20160929
Lead Channel Impedance Value: 350 Ohm
Lead Channel Impedance Value: 400 Ohm
Lead Channel Pacing Threshold Amplitude: 0.75 V
Lead Channel Pacing Threshold Amplitude: 1 V
Lead Channel Pacing Threshold Pulse Width: 0.4 ms
Lead Channel Pacing Threshold Pulse Width: 0.5 ms
Lead Channel Sensing Intrinsic Amplitude: 3.4 mV
Lead Channel Sensing Intrinsic Amplitude: 8.7 mV
Lead Channel Setting Pacing Amplitude: 2 V
Lead Channel Setting Pacing Amplitude: 2.5 V
Lead Channel Setting Pacing Pulse Width: 0.5 ms
Lead Channel Setting Sensing Sensitivity: 0.5 mV
Pulse Gen Serial Number: 7306375

## 2020-04-14 NOTE — Progress Notes (Signed)
Remote ICD transmission.   

## 2020-04-18 ENCOUNTER — Telehealth: Payer: Self-pay | Admitting: Cardiovascular Disease

## 2020-04-18 ENCOUNTER — Telehealth: Payer: Self-pay | Admitting: *Deleted

## 2020-04-18 NOTE — Telephone Encounter (Signed)
Received records request [place], placed in nurses box

## 2020-04-18 NOTE — Telephone Encounter (Signed)
   [facility] Pre-operative Risk Assessment    HEARTCARE STAFF: - Please ensure there is not already an duplicate clearance open for this procedure. - Under Visit Info/Reason for Call, type in Other and utilize the format Clearance MM/DD/YY or Clearance TBD. Do not use dashes or single digits. - If request is for dental extraction, please clarify the # of teeth to be extracted.  Request for surgical clearance:  1. What type of surgery is being performed? 12 TEETH TO BE EXTRACTED w/ALVEOLOPLASTY   2. When is this surgery scheduled? TBD   3. What type of clearance is required (medical clearance vs. Pharmacy clearance to hold med vs. Both)? MEDICAL  4. Are there any medications that need to be held prior to surgery and how long? ASA AND PLAVIX   5. Practice name and name of physician performing surgery? SCOTT JENSEN, D.M.D, [facility]   6. What is the office phone number? [phone]   7.   What is the office fax number? [phone]  8.   Anesthesia type (None, local, MAC, general) ? GENERAL   Julaine Hua 04/18/2020, 2:41 PM  _________________________________________________________________   (provider comments below)

## 2020-04-19 NOTE — Telephone Encounter (Signed)
Left message for the patient to call back and speak with on call preop APP of the day. Dr. Rockey Situ to review ASA and plavix.   Last cath performed at [facility] on 07/08/2019 after myoview, result below: Findings:  1. There is Significant 3-vessel coronary artery disease including 60%  stenosis of distal LMCA, complete occlusion of the mLAD & mRCA and 80-90%  stenosis of OM2 & OM3.  2. Patent LIMA-mLAD, VG-rPDA and VG-OM2.VG-D known to be occluded.  3. Normal right and left ventricular filling pressures.  4. Normal cardiac output.   Recommendations:  1. Aggressive risk factor modification and secondary prevention.    Patient was last seen by Dr. Rockey Situ in Apr 2021, he has quite significant h/o CAD s/p CABG, h/o VT s/p ICD followed by Dr. Caryl Comes, DM II and HLD. Dr. Rockey Situ, please comment on hold antiplatelet medications such as aspirin and plavix prior to 12 teeth extraction. Please forward your response to P CV DIV PREOP

## 2020-04-19 NOTE — Telephone Encounter (Signed)
Spoken with Brian Williams who is aware we are waiting on Dr. Donivan Scull recommendation on the DAPT, he denies any recent chest pain or worsening dyspnea. Overall, he is doing well. He is clearance for dental extraction from cardiac perspective.

## 2020-04-21 ENCOUNTER — Ambulatory Visit (INDEPENDENT_AMBULATORY_CARE_PROVIDER_SITE_OTHER): Payer: Medicare Other | Admitting: Primary Care

## 2020-04-21 ENCOUNTER — Other Ambulatory Visit: Payer: Self-pay

## 2020-04-21 ENCOUNTER — Encounter: Payer: Self-pay | Admitting: Primary Care

## 2020-04-21 VITALS — BP 120/72 | HR 80 | Temp 96.1°F | Ht 72.0 in | Wt 287.2 lb

## 2020-04-21 DIAGNOSIS — N4889 Other specified disorders of penis: Secondary | ICD-10-CM

## 2020-04-21 MED ORDER — NYSTATIN-TRIAMCINOLONE 100000-0.1 UNIT/GM-% EX OINT: 1.0000 "application " | TOPICAL_OINTMENT | Freq: Two times a day (BID) | CUTANEOUS | 0 refills | Status: DC

## 2020-04-21 MED ORDER — FLUCONAZOLE 150 MG PO TABS: 150.0000 mg | ORAL_TABLET | Freq: Once | ORAL | 0 refills | Status: AC

## 2020-04-21 NOTE — Progress Notes (Signed)
Subjective:    Patient ID: Brian Williams, male    DOB: 08-02-1961, 59 y.o.   MRN: [id number]  HPI  This visit occurred during the SARS-CoV-2 public health emergency.  Safety protocols were in place, including screening questions prior to the visit, additional usage of staff PPE, and extensive cleaning of exam room while observing appropriate contact time as indicated for disinfecting solutions.   [name] is a 59 year old male with a significant medical history including uncontrolled type 2 diabetes, hypertension, CHF, CAD, OSA, hyperlipidemia, obesity,  genital yeast infection who presents today with a chief complaint of genital rash.  He is currently managed on Farxiga for type 2 diabetes. Previously evaluated for same symptoms of penile rash with itching, treated with fluconazole with resolve. At the time we discussed to mention his genital yeast rashes to his endocrinologist as the [place] was suspected to be contributing, he has yet to do so. He continues to take [place] and will continue for now. He cannot afford his insulin or other diabetes medications as he is in the donut hole for medicare.   His recent rash began about four weeks ago and is located underneath the shaft of the penis. He has chronic hidradenitis, had one of his "cysts pop" to the left groin about the same time. He's tried OTC cortisone under the penile shaft with temporary improvement.   BP Readings from Last 3 Encounters:  04/21/20 120/72  04/05/20 114/64  02/01/20 120/64     Review of Systems  Constitutional: Negative for fever.  Genitourinary: Negative for discharge, dysuria, frequency and penile pain.       Penile rash  Skin: Positive for rash.       Past Medical History:  Diagnosis Date  . AICD (automatic cardioverter/defibrillator) present   . Anxiety    takes Xanax as needed  . Arthritis    back,knees,right shoulder  . Back pain   . Cardiomyopathy (Brian Williams)   . CHF (congestive heart failure)  (HCC)    takes Lasix and Aldactone daily  . Coronary artery disease    takes Plavix daily  . Depression    takes Zoloft daily  . Diabetes mellitus without complication (HCC)    Humulin R and Farxiga daily.Fasting blood sugar runs 140  . GERD (gastroesophageal reflux disease)   . Headache   . History of bronchitis   . History of colon polyps    benign  . History of hiatal hernia   . Hyperlipidemia    takes Fenofibrate,Crestor, and Zetia daily  . Hypertension    takes Entresto and Coreg daily  . MI (myocardial infarction) ([place]) 2001  . Obesity   . Peripheral neuropathy   . Pneumonia    history of-last time about 14 yrs ago  . PONV (postoperative nausea and vomiting)    after knee surgery 25 yrs ago b/p stayed elevated for a while  . Presence of permanent cardiac pacemaker   . Shortness of breath dyspnea    with exertion  . Sleep apnea    uses CPAP nightly  . Ventricular tachycardia ([place])    s/p RFCA PVCs 2013     Social History   Socioeconomic History  . Marital status: Married    Spouse name: Not on file  . Number of children: Not on file  . Years of education: Not on file  . Highest education level: Not on file  Occupational History  . Not on file  Tobacco Use  .  Smoking status: Former Smoker    Packs/day: 0.25    Years: 25.00    Pack years: 6.25    Types: Cigarettes    Quit date: 05/19/2018    Years since quitting: 1.9  . Smokeless tobacco: Never Used  Vaping Use  . Vaping Use: Never used  Substance and Sexual Activity  . Alcohol use: No    Comment: rare  . Drug use: No  . Sexual activity: Not Currently  Other Topics Concern  . Not on file  Social History Narrative   Married.   Moved from [place].   Disabled.   Social Determinants of Health   Financial Resource Strain:   . Difficulty of Paying Living Expenses:   Food Insecurity:   . Worried About Charity fundraiser in the Last Year:   . Arboriculturist in the Last Year:   Transportation  Needs:   . Film/video editor (Medical):   Marland Kitchen Lack of Transportation (Non-Medical):   Physical Activity:   . Days of Exercise per Week:   . Minutes of Exercise per Session:   Stress:   . Feeling of Stress :   Social Connections:   . Frequency of Communication with Friends and Family:   . Frequency of Social Gatherings with Friends and Family:   . Attends Religious Services:   . Active Member of Clubs or Organizations:   . Attends Archivist Meetings:   Marland Kitchen Marital Status:   Intimate Partner Violence:   . Fear of Current or Ex-Partner:   . Emotionally Abused:   Marland Kitchen Physically Abused:   . Sexually Abused:     Past Surgical History:  Procedure Laterality Date  . BACK SURGERY    . CARDIAC CATHETERIZATION    . CARDIAC CATHETERIZATION Left 05/10/2016   Procedure: Left Heart Cath and Coronary Angiography;  Surgeon: Minna Merritts, MD;  Location: [facility];  Service: Cardiovascular;  Laterality: Left;  . CARDIAC DEFIBRILLATOR PLACEMENT  10/16/2007   ICD Model number 2207-36 serial number [id number]  . CARDIAC ELECTROPHYSIOLOGY STUDY AND ABLATION    . CHOLECYSTECTOMY  2001  . COLONOSCOPY    . COLONOSCOPY WITH PROPOFOL N/A 08/28/2018   Procedure: COLONOSCOPY WITH PROPOFOL;  Surgeon: Jonathon Bellows, MD;  [facility];  Service: Gastroenterology;  Laterality: N/A;  . CORONARY ANGIOPLASTY WITH STENT PLACEMENT     7 stents  . CORONARY ARTERY BYPASS GRAFT N/A 05/24/2016   Procedure: CORONARY ARTERY BYPASS GRAFTING (CABG) x four , using left internal mammary artery and left leg greater saphenous vein harvested endoscopically;  Surgeon: Gaye Pollack, MD;  Location: [facility];  Service: Open Heart Surgery;  Laterality: N/A;  . EP IMPLANTABLE DEVICE N/A 06/16/2015   Procedure: ICD Generator Changeout;  Surgeon: Deboraha Sprang, MD;  Location: [facility];  Service: Cardiovascular;  Laterality: N/A;  . ESOPHAGOGASTRODUODENOSCOPY    . INSERT / REPLACE / REMOVE PACEMAKER     . KNEE SURGERY     bilateral knee   . [name] WITHOUT CARDIOVERSION N/A 05/24/2016   Procedure: TRANSESOPHAGEAL ECHOCARDIOGRAM ([name]);  Surgeon: Gaye Pollack, MD;  Location: [facility];  Service: Open Heart Surgery;  Laterality: N/A;  . VASECTOMY      Family History  Problem Relation Age of Onset  . Heart attack Mother 72  . Hypertension Mother   . Heart attack Father 21  . Hypertension Father   . Hypertension Maternal Uncle   . Heart disease Maternal  Uncle   . Heart disease Maternal Grandmother   . Stroke Maternal Grandmother   . Diabetes Neg Hx     Allergies  Allergen Reactions  . Morphine And Related Nausea And Vomiting    Morphine only  . Naproxen Other (See Comments)    Causes hyperactivity    Current Outpatient Medications on File Prior to Visit  Medication Sig Dispense Refill  . aspirin EC 81 MG tablet Take 81 mg by mouth daily.  90 tablet   . carvedilol (COREG) 25 MG tablet Take 1 tablet (25 mg total) by mouth 2 (two) times daily with a meal. 180 tablet 0  . clopidogrel (PLAVIX) 75 MG tablet TAKE ONE TABLET BY MOUTH DAILY 90 tablet 1  . dapagliflozin propanediol (FARXIGA) 10 MG TABS tablet Take 10 mg by mouth daily.    Marland Kitchen escitalopram (LEXAPRO) 20 MG tablet TAKE ONE TABLET BY MOUTH DAILY FOR ANXIETY AND DEPRESSION 90 tablet 1  . ezetimibe (ZETIA) 10 MG tablet Take 1 tablet (10 mg total) by mouth daily. 90 tablet 0  . furosemide (LASIX) 40 MG tablet TAKE TWO TABLETS BY MOUTH EVERY MORNING AND TAKE ONE TABLET BY MOUTH EVERY EVENING. MAY TAKE 1 TABLET IN THE AFTERNOON AS NEEDED 360 tablet 2  . gemfibrozil (LOPID) 600 MG tablet Take 1 tablet (600 mg total) by mouth daily. 90 tablet 1  . hydrOXYzine (ATARAX/VISTARIL) 25 MG tablet Take 1 tablet (25 mg total) by mouth 2 (two) times daily as needed for anxiety. 180 tablet 0  . ibuprofen (ADVIL,MOTRIN) 200 MG tablet Take 600 mg by mouth every 6 (six) hours as needed.    . insulin regular human CONCENTRATED (HUMULIN R) 500 UNIT/ML  injection For use in pump, for a total of 130 units/day (Patient taking differently: For use in pump, for a total of 170 units/day) 20 mL 11  . rosuvastatin (CRESTOR) 20 MG tablet TAKE ONE TABLET BY MOUTH EVERY NIGHT AT BEDTIME FOR CHOLESTEROL 90 tablet 1  . sacubitril-valsartan (ENTRESTO) 97-103 MG Take 1 tablet by mouth 2 (two) times daily.    Marland Kitchen spironolactone (ALDACTONE) 50 MG tablet Take 50 mg by mouth daily.    . traZODone (DESYREL) 100 MG tablet TAKE TWO TABLETS BY MOUTH EVERY NIGHT AT BEDTIME 180 tablet 1  . [DISCONTINUED] DULoxetine (CYMBALTA) 30 MG capsule Take 1 capsule by mouth for 7 days, then 2 capsules thereafter for depression. 60 capsule 1   No current facility-administered medications on file prior to visit.    BP 120/72   Pulse 80   Temp (!) 96.1 F (35.6 C) (Temporal)   Ht 6' (1.829 m)   Wt 287 lb 4 oz (130.3 kg)   SpO2 97%   BMI 38.96 kg/m    Objective:   Physical Exam Cardiovascular:     Rate and Rhythm: Normal rate.  Pulmonary:     Effort: Pulmonary effort is normal.  Genitourinary:    Comments: Penile rash to underside of shaft. No cellulitis. Chaperone present. Skin:    General: Skin is warm and dry.     Findings: Rash present.     Comments: Mild erythema with irritation to under penile shaft. No obvious cellulitis.   Neurological:     Mental Status: He is alert.            Assessment & Plan:

## 2020-04-21 NOTE — Telephone Encounter (Signed)
   Primary Cardiologist: Ida Rogue, MD  Chart reviewed as part of pre-operative protocol coverage. Given past medical history and time since last visit, based on ACC/AHA guidelines, Brian Williams would be at acceptable risk for the planned procedure without further cardiovascular testing.     His Plavix may be held for 5 days prior to his procedure.  Please resume as soon as hemostasis is achieved.  Due to his coronary artery disease and cardiovascular risk we would like him to remain on his aspirin for his upcoming procedure.  SBE prophylaxis is not required for the patient.  I will route this recommendation to the requesting party via Epic fax function and remove from pre-op pool.  Please call with questions.  Deberah Pelton, NP 04/21/2020, 2:44 PM

## 2020-04-21 NOTE — Patient Instructions (Signed)
Take the fluconazole pill once for rash.  Use the nystatin-triamcinolone cream twice daily as needed.  Please discuss these recurrent rashes with your endocrinologist.   It was a pleasure to see you today!

## 2020-04-21 NOTE — Assessment & Plan Note (Signed)
Appears to be yeast rash, likely from hyperglycemia and [place]. Discussed to notify endocrinologist of recurrent genital yeast rashes.  Rx for fluconazole provided. Rx for nystatin-triamcinolone cream provided.   He will update.

## 2020-04-21 NOTE — Telephone Encounter (Signed)
Would stay on aspirin There is a risk coming off Plavix for 5 days, will defer to the patient whether he is willing to take the risk He does have high risk coronary disease If patient requires the procedure, would stop the Plavix for 5 days restart Plavix after procedure

## 2020-04-25 NOTE — Telephone Encounter (Signed)
Patient calling for status update on [employer] forms.  Please call patient has to meet a deadline to submit.

## 2020-04-25 NOTE — Telephone Encounter (Signed)
Spoke with patient and reviewed that forms for clearance may take a few days. He also inquired about some other disability forms that were sent in. Reviewed that I have not seen any yet but that I would check on these to see where they may be and would give him a call back. He verbalized understanding with no further questions at this time.

## 2020-04-27 NOTE — Telephone Encounter (Signed)
Spoke with patient and reviewed that these disability forms would need to be sent to his primary care provider for completion. He requested that I please send him the forms as well in the mail. He also inquired about clearance and reviewed that it was sent to provider requesting the clearance. He verbalized understanding of our conversation, agreement with plan, and had no further questions at this time.

## 2020-05-03 NOTE — Telephone Encounter (Signed)
Patient calling to check on status  States he still has not received the disability forms he will need to give PCP  Patient will wait a few more days and will call back if still not received

## 2020-05-03 NOTE — Telephone Encounter (Signed)
Spoke with patient and inquired if had received them by mail since I sent those off. He denied stating that he has not received it yet. Patient stated that he would wait for a couple of more days to see if it arrives. Reviewed that it was sent out last week. He was appreciative for the call back with no further questions at this time.

## 2020-05-05 NOTE — Telephone Encounter (Signed)
Spoke with patient and reviewed that we had discussed his forms would need to be completed by primary care provider. He states they will not fill out forms for continued disability due to most of it being cardiology in nature. Inquired if he is still seeing cardiologist at [facility] and he said that he sees both [place] and them but they will not fill out either. Requested that he please fax forms back to me so that I can review again for him. He was appreciative for the review and let him know that I would review with provider and call him once done. He verbalized understanding with no further questions.

## 2020-05-10 ENCOUNTER — Other Ambulatory Visit: Payer: Self-pay | Admitting: Cardiovascular Disease

## 2020-05-10 NOTE — Telephone Encounter (Signed)
Patient calling to fu on forms he has been waiting on .  These are due by Friday.

## 2020-05-10 NOTE — Telephone Encounter (Signed)
Spoke with patient and reviewed that I have the forms but Dr. Rockey Situ was not here yesterday. Advised that I would need to fill out some of this and that I would call when ready. He verbalized understanding and states that he needs it by Friday to get paid. Advised that I would review with provider. He was appreciative for the call back with no further questions at this time.

## 2020-05-12 DIAGNOSIS — Z794 Long term (current) use of insulin: Secondary | ICD-10-CM | POA: Diagnosis not present

## 2020-05-12 DIAGNOSIS — E1165 Type 2 diabetes mellitus with hyperglycemia: Secondary | ICD-10-CM | POA: Diagnosis not present

## 2020-05-13 NOTE — Telephone Encounter (Signed)
Patient calling in about forms that need to be signed today in order to be paid. Patient can pick them up today  Please advise

## 2020-05-13 NOTE — Telephone Encounter (Addendum)
DPR on file. Lmom.  Spoke with Dr. Rockey Situ. He is out of the office today and he is rounding in the hospital and he is swamped. He will try to get the form completed today, but he cannot guarantee that he will be able to get to it.  The patient will be called once the form has been completed and is ready for pick up.

## 2020-05-13 NOTE — Telephone Encounter (Addendum)
Patient made aware that the form (disability status update) has been completed and signed by Dr. Rockey Situ. Form faxed to [place] [phone]. Fax confirmation received.  Patient rqst a copy of the form be mailed to him for his records. (done)

## 2020-05-13 NOTE — Telephone Encounter (Signed)
Paperwork is on Dr. Donivan Scull desk awaiting his signature.  Secure chat sent to Dr. Rockey Situ today @ 11:25am. Asking if he would be available to come down to the office to sign the form. Awaiting Dr. Rockey Situ' response.

## 2020-05-25 DIAGNOSIS — E119 Type 2 diabetes mellitus without complications: Secondary | ICD-10-CM | POA: Diagnosis not present

## 2020-06-06 ENCOUNTER — Telehealth: Payer: Self-pay | Admitting: Cardiovascular Disease

## 2020-06-06 NOTE — Telephone Encounter (Signed)
[employer] forms received for disability Placed in nurse box

## 2020-06-07 ENCOUNTER — Other Ambulatory Visit: Payer: Self-pay | Admitting: Cardiovascular Disease

## 2020-06-07 DIAGNOSIS — E119 Type 2 diabetes mellitus without complications: Secondary | ICD-10-CM | POA: Diagnosis not present

## 2020-06-07 NOTE — Telephone Encounter (Signed)
Reviewing forms for provider so they are on my desk.

## 2020-06-07 NOTE — Telephone Encounter (Signed)
Filled out portion of forms and will place on Dr. Donivan Scull desk for his final review and signature on 06/07/20@ 11:18 AM. Will monitor for completion to fax back to company.

## 2020-06-15 ENCOUNTER — Other Ambulatory Visit: Payer: Self-pay | Admitting: Primary Care

## 2020-06-15 DIAGNOSIS — E785 Hyperlipidemia, unspecified: Secondary | ICD-10-CM

## 2020-06-15 DIAGNOSIS — E7800 Pure hypercholesterolemia, unspecified: Secondary | ICD-10-CM

## 2020-06-20 DIAGNOSIS — G4733 Obstructive sleep apnea (adult) (pediatric): Secondary | ICD-10-CM | POA: Diagnosis not present

## 2020-06-24 DIAGNOSIS — G4733 Obstructive sleep apnea (adult) (pediatric): Secondary | ICD-10-CM | POA: Diagnosis not present

## 2020-06-24 DIAGNOSIS — Z794 Long term (current) use of insulin: Secondary | ICD-10-CM | POA: Diagnosis not present

## 2020-06-24 DIAGNOSIS — Z95810 Presence of automatic (implantable) cardiac defibrillator: Secondary | ICD-10-CM | POA: Diagnosis not present

## 2020-06-24 DIAGNOSIS — E1165 Type 2 diabetes mellitus with hyperglycemia: Secondary | ICD-10-CM | POA: Diagnosis not present

## 2020-06-24 DIAGNOSIS — Z955 Presence of coronary angioplasty implant and graft: Secondary | ICD-10-CM | POA: Diagnosis not present

## 2020-06-24 DIAGNOSIS — Z9049 Acquired absence of other specified parts of digestive tract: Secondary | ICD-10-CM | POA: Diagnosis not present

## 2020-06-24 DIAGNOSIS — Z23 Encounter for immunization: Secondary | ICD-10-CM | POA: Diagnosis not present

## 2020-06-24 DIAGNOSIS — E1140 Type 2 diabetes mellitus with diabetic neuropathy, unspecified: Secondary | ICD-10-CM | POA: Diagnosis not present

## 2020-06-24 DIAGNOSIS — Z951 Presence of aortocoronary bypass graft: Secondary | ICD-10-CM | POA: Diagnosis not present

## 2020-06-24 DIAGNOSIS — Z7984 Long term (current) use of oral hypoglycemic drugs: Secondary | ICD-10-CM | POA: Diagnosis not present

## 2020-06-24 DIAGNOSIS — Z7982 Long term (current) use of aspirin: Secondary | ICD-10-CM | POA: Diagnosis not present

## 2020-06-24 DIAGNOSIS — Z602 Problems related to living alone: Secondary | ICD-10-CM | POA: Diagnosis not present

## 2020-06-24 DIAGNOSIS — I2510 Atherosclerotic heart disease of native coronary artery without angina pectoris: Secondary | ICD-10-CM | POA: Diagnosis not present

## 2020-06-24 DIAGNOSIS — I255 Ischemic cardiomyopathy: Secondary | ICD-10-CM | POA: Diagnosis not present

## 2020-06-24 DIAGNOSIS — I110 Hypertensive heart disease with heart failure: Secondary | ICD-10-CM | POA: Diagnosis not present

## 2020-06-24 DIAGNOSIS — I5022 Chronic systolic (congestive) heart failure: Secondary | ICD-10-CM | POA: Diagnosis not present

## 2020-06-24 DIAGNOSIS — I509 Heart failure, unspecified: Secondary | ICD-10-CM | POA: Diagnosis not present

## 2020-06-24 DIAGNOSIS — Z9641 Presence of insulin pump (external) (internal): Secondary | ICD-10-CM | POA: Diagnosis not present

## 2020-06-24 DIAGNOSIS — E785 Hyperlipidemia, unspecified: Secondary | ICD-10-CM | POA: Diagnosis not present

## 2020-06-25 NOTE — Progress Notes (Addendum)
Cardiology Office Note  Date:  06/27/2020   ID:  Brian Williams, DOB 1961/01/03, MRN [id number]  PCP:  Pleas Koch, NP   Chief Complaint  Patient presents with  . Other    6 month follow up. Patient c.o legs getting worse. Meds reviewed verbally with patient.     HPI:  Brian Williams is a pleasant 59 year old gentleman with history of  coronary artery disease,  long history of smoking,  diabetes type 2 poorly controlled with hemoglobin A1c of 9, followed by endocrine/Dr. Gabriel Carina, cardiac catheterization 12 dating back to 2002,  ischemic cardiomyopathy,  echocardiogram March 2016 showing ejection fraction 20-25%,   sustained VT, VT ablation in [place] at [facility] sleep apnea, on CPAP, generator change in 2016, ICD in place,  previously seen by seen by [facility] cardiology heart failure/transplant team, Brian Williams.  EF 35% in 2017 Last cath 06/2019 who presents for routine follow-up of his coronary artery disease, CABG, September 2017  Wife works, he stays home Does the adls at home [employer], house chores done  Followed at [facility] cardiology  Difficulty getting several of his medications secondary to cost Including entresto, has been off it for 2 months Reports [facility] put through an appeal which has been approved last week And some diabetes medications, was of his insulin for over a week sugars went up to 700, temporary back on the 70/30 insulin through [place] with assistance from endocrinology  Denies angina sx Some chronic fatigue, no exacerbation of his anginal symptoms  Labs reviewed CR 0.83,  HBA1C 10.3 down to 8.4 Total chol 161, TG 773  Pacer download: normal function  Extreme pain in legs "24 -7" Thighs/calfs Taking tramadol 3x a day, with high dose tylenol Tramadol ran out, now taking high dose tylenol with advil  EKG personally reviewed by myself on todays visit Shows normal sinus rhythm rate 66 bpm, old anterior MI, nonspecific ST ABN  Other past  medical history reviewed Cath at 06/2019 at [facility] . There is Significant 3-vessel coronary artery disease including 60%  stenosis of distal LMCA, complete occlusion of the mLAD & mRCA and 80-90%  stenosis of OM2 & OM3.  2. Patent LIMA-mLAD, VG-rPDA and VG-OM2. VG-D known to be occluded.  3. Normal right and left ventricular filling pressures.  4. Normal cardiac output.   CABG By Dr. Cyndia Bent September 2017 Left internal mammary graft to the LAD SVG to diagonal, SVG to OM, SVG to PDA  cath on 05/10/2016 showing a 70% eccentric distal LM stenosis, 80% proximal LAD in-stent restenosis, 80% diagonal stenosis, and tandem 90% proximal and mid RCA stenoses with mild in-stent narrowing. The LVEF is visually 35-45%  Previously seen in [place] for his cardiac issues. History dates back to 2002 when he had distal RCA disease. Several catheterizations over the next several years for in-stent restenosis of the distal RCA. Later cath, stent placed to the LAD. Most recent catheterization appears to be April 2011 showing patent LAD and RCA stent.  Despite this, he has had a decline in his ejection fraction over the past several years requiring defibrillator for ventricular tachycardia. Arrhythmia seems to be controlled on nadolol (was previously on 80 mg twice a day, decreased down to daily secondary to low blood pressure and fatigue)  seen by [facility] cardiology heart failure/transplant team, Brian Williams.   PMH:   has a past medical history of AICD (automatic cardioverter/defibrillator) present, Anxiety, Arthritis, Back pain, Cardiomyopathy ([place]), CHF (congestive heart failure) ([place]), Coronary artery disease, Depression,  Diabetes mellitus without complication ([place]), GERD (gastroesophageal reflux disease), Headache, History of bronchitis, History of colon polyps, History of hiatal hernia, Hyperlipidemia, Hypertension, MI (myocardial infarction) ([place]) (2001), Obesity, Peripheral neuropathy, Pneumonia, PONV  (postoperative nausea and vomiting), Presence of permanent cardiac pacemaker, Shortness of breath dyspnea, Sleep apnea, and Ventricular tachycardia ([place]).  PSH:    Past Surgical History:  Procedure Laterality Date  . BACK SURGERY    . CARDIAC CATHETERIZATION    . CARDIAC CATHETERIZATION Left 05/10/2016   Procedure: Left Heart Cath and Coronary Angiography;  Surgeon: Minna Merritts, MD;  Location: [facility];  Service: Cardiovascular;  Laterality: Left;  . CARDIAC DEFIBRILLATOR PLACEMENT  10/16/2007   ICD Model number 2207-36 serial number [id number]  . CARDIAC ELECTROPHYSIOLOGY STUDY AND ABLATION    . CHOLECYSTECTOMY  2001  . COLONOSCOPY    . COLONOSCOPY WITH PROPOFOL N/A 08/28/2018   Procedure: COLONOSCOPY WITH PROPOFOL;  Surgeon: Jonathon Bellows, MD;  [facility] ENDOSCOPY;  Service: Gastroenterology;  Laterality: N/A;  . CORONARY ANGIOPLASTY WITH STENT PLACEMENT     7 stents  . CORONARY ARTERY BYPASS GRAFT N/A 05/24/2016   Procedure: CORONARY ARTERY BYPASS GRAFTING (CABG) x four , using left internal mammary artery and left leg greater saphenous vein harvested endoscopically;  Surgeon: Gaye Pollack, MD;  Location: [facility];  Service: Open Heart Surgery;  Laterality: N/A;  . EP IMPLANTABLE DEVICE N/A 06/16/2015   Procedure: ICD Generator Changeout;  Surgeon: Deboraha Sprang, MD;  Location: [place] [facility];  Service: Cardiovascular;  Laterality: N/A;  . ESOPHAGOGASTRODUODENOSCOPY    . INSERT / REPLACE / REMOVE PACEMAKER    . KNEE SURGERY     bilateral knee   . [name] WITHOUT CARDIOVERSION N/A 05/24/2016   Procedure: TRANSESOPHAGEAL ECHOCARDIOGRAM ([name]);  Surgeon: Gaye Pollack, MD;  Location: [facility];  Service: Open Heart Surgery;  Laterality: N/A;  . VASECTOMY      Current Outpatient Medications  Medication Sig Dispense Refill  . aspirin EC 81 MG tablet Take 81 mg by mouth daily.  90 tablet   . carvedilol (COREG) 25 MG tablet TAKE 1 TABLET BY MOUTH  TWICE DAILY WITH A MEAL 180 tablet  3  . clopidogrel (PLAVIX) 75 MG tablet TAKE ONE TABLET BY MOUTH DAILY 90 tablet 1  . dapagliflozin propanediol (FARXIGA) 10 MG TABS tablet Take 10 mg by mouth daily.    Marland Kitchen escitalopram (LEXAPRO) 20 MG tablet TAKE ONE TABLET BY MOUTH DAILY FOR ANXIETY AND DEPRESSION 90 tablet 1  . ezetimibe (ZETIA) 10 MG tablet TAKE 1 TABLET BY MOUTH  DAILY 90 tablet 0  . furosemide (LASIX) 40 MG tablet TAKE TWO TABLETS BY MOUTH EVERY MORNING AND TAKE ONE TABLET BY MOUTH EVERY EVENING. MAY TAKE 1 TABLET IN THE AFTERNOON AS NEEDED 360 tablet 2  . gemfibrozil (LOPID) 600 MG tablet TAKE 1 TABLET BY MOUTH  DAILY 90 tablet 3  . hydrOXYzine (ATARAX/VISTARIL) 25 MG tablet Take 1 tablet (25 mg total) by mouth 2 (two) times daily as needed for anxiety. 180 tablet 0  . ibuprofen (ADVIL,MOTRIN) 200 MG tablet Take 600 mg by mouth every 6 (six) hours as needed.    . insulin regular human CONCENTRATED (HUMULIN R) 500 UNIT/ML injection For use in pump, for a total of 130 units/day (Patient taking differently: For use in pump, for a total of 170 units/day) 20 mL 11  . rosuvastatin (CRESTOR) 20 MG tablet TAKE 1 TABLET BY MOUTH AT  BEDTIME FOR CHOLESTEROL  90 tablet 3  . sacubitril-valsartan (ENTRESTO) 97-103 MG Take 1 tablet by mouth 2 (two) times daily.    . Semaglutide,0.25 or 0.5MG /DOS, (OZEMPIC, 0.25 OR 0.5 MG/DOSE,) 2 MG/1.5ML SOPN Inject into the skin.    Marland Kitchen spironolactone (ALDACTONE) 50 MG tablet Take 50 mg by mouth daily.    . traZODone (DESYREL) 100 MG tablet TAKE 2 TABLETS BY MOUTH  EVERY NIGHT AT BEDTIME 180 tablet 3   No current facility-administered medications for this visit.     Allergies:   Morphine and related and Naproxen   Social History:  The patient  reports that he quit smoking about 2 years ago. His smoking use included cigarettes. He has a 6.25 pack-year smoking history. He has never used smokeless tobacco. He reports that he does not drink alcohol and does not use drugs.   Family History:   family history  includes Heart attack (age of onset: 93) in his mother; Heart attack (age of onset: 45) in his father; Heart disease in his maternal grandmother and maternal uncle; Hypertension in his father, maternal uncle, and mother; Stroke in his maternal grandmother.    Review of Systems: Review of Systems  Constitutional: Positive for malaise/fatigue.  HENT: Negative.   Respiratory: Negative.   Cardiovascular: Negative.   Gastrointestinal: Negative.   Musculoskeletal: Negative.        Leg pain  Neurological: Negative.   Psychiatric/Behavioral: Negative.   All other systems reviewed and are negative.   PHYSICAL EXAM: VS:  BP (!) 100/56 (BP Location: Left Arm, Patient Position: Sitting, Cuff Size: Normal)   Pulse 66   Ht 6' (1.829 m)   Wt 283 lb (128.4 kg)   BMI 38.38 kg/m  , BMI Body mass index is 38.38 kg/m. Constitutional:  oriented to person, place, and time. No distress.  HENT:  Head: Grossly normal Eyes:  no discharge. No scleral icterus.  Neck: No JVD, no carotid bruits  Cardiovascular: Regular rate and rhythm, no murmurs appreciated Pulmonary/Chest: Clear to auscultation bilaterally, no wheezes or rails Abdominal: Soft.  no distension.  no tenderness.  Musculoskeletal: Normal range of motion Neurological:  normal muscle tone. Coordination normal. No atrophy Skin: Skin warm and dry Psychiatric: normal affect, pleasant   Recent Labs: 04/05/2020: BUN 19; Creatinine, Ser 0.83; Potassium 3.9; Sodium 137    Lipid Panel Lab Results  Component Value Date   CHOL 171 07/21/2018   HDL 27.40 (L) 07/21/2018   TRIG (H) 07/21/2018    952.0 Triglyceride is over 400; calculations on Lipids are invalid.      Wt Readings from Last 3 Encounters:  06/27/20 283 lb (128.4 kg)  04/21/20 287 lb 4 oz (130.3 kg)  04/05/20 286 lb (129.7 kg)     ASSESSMENT AND PLAN:  Congestive dilated cardiomyopathy ([place]) - Plan: EKG 12-Lead Appears euvolemic Off Entresto for 2 months, recently appealed  which was successful through [facility] Blood pressure low today but he reports it is typically 458 up to 099 systolic at home Recommend he reintroduce his Entresto slowly, consider starting half pill twice daily before titrating up to a whole pill  Essential hypertension - Plan: EKG 12-Lead Medication changes as above, he will reintroduce the Entresto slowly  Chronic systolic heart failure ([place]) - Plan: EKG 12-Lead Chronic shortness of breath, unchanged Chronic fatigue, on Lasix twice daily Appears euvolemic  Stable angina (H[facility]) Chronic chest symptoms, with shortness of breath symptoms Prior cath reviewed, no intervention, occluded graft On 3 [facility] No changes to his medications  apart from recommend he hold Crestor for 1 to 2 weeks to see if this helps with his leg pain If it does help he may need a PCSK9 inhibitor  Morbid obesity with BMI of 40.0-44.9, adult ([place]) Lifestyle modification recommended Unable to walk much secondary to chronic leg pain  Hx of CABG Chronic stable anginal symptoms Reports he had to quit cardiac rehab secondary to leg pain  OSA on CPAP Recommended compliance with his CPAP  Pure hypercholesterolemia On Crestor and Zetia We may hold Crestor for 1 to 2 weeks to see if this helps with his leg pain  Active smoker On Chantix  Leg pain/severe myalgia Etiology unclear, Debilitating pain, unable to sit for any length of time on the exam table, has to get up and keep walking around secondary to pain Reports pain has been excruciating, does not have a reason Will try to hold the crestor for 1-2 weeks If no benefit, may need further evaluation by primary care, neurology, may need gabapentin   Total encounter time more than 25 minutes  Greater than 50% was spent in counseling and coordination of care with the patient   Has follow-up with numerous physicians including [facility] cardiology   Orders Placed This Encounter  Procedures  . EKG 12-Lead     Signed, Esmond Plants, M.D., Ph.D. 06/27/2020  [place], [place]

## 2020-06-27 ENCOUNTER — Ambulatory Visit: Payer: Medicare Other | Admitting: Cardiovascular Disease

## 2020-06-27 ENCOUNTER — Encounter: Payer: Self-pay | Admitting: Cardiovascular Disease

## 2020-06-27 ENCOUNTER — Other Ambulatory Visit: Payer: Self-pay

## 2020-06-27 VITALS — BP 100/56 | HR 66 | Ht 72.0 in | Wt 283.0 lb

## 2020-06-27 DIAGNOSIS — I10 Essential (primary) hypertension: Secondary | ICD-10-CM

## 2020-06-27 DIAGNOSIS — I255 Ischemic cardiomyopathy: Secondary | ICD-10-CM

## 2020-06-27 DIAGNOSIS — Z95810 Presence of automatic (implantable) cardiac defibrillator: Secondary | ICD-10-CM

## 2020-06-27 DIAGNOSIS — G4733 Obstructive sleep apnea (adult) (pediatric): Secondary | ICD-10-CM

## 2020-06-27 DIAGNOSIS — IMO0002 Reserved for concepts with insufficient information to code with codable children: Secondary | ICD-10-CM

## 2020-06-27 DIAGNOSIS — E118 Type 2 diabetes mellitus with unspecified complications: Secondary | ICD-10-CM

## 2020-06-27 DIAGNOSIS — I5022 Chronic systolic (congestive) heart failure: Secondary | ICD-10-CM | POA: Diagnosis not present

## 2020-06-27 DIAGNOSIS — E1142 Type 2 diabetes mellitus with diabetic polyneuropathy: Secondary | ICD-10-CM

## 2020-06-27 DIAGNOSIS — Z9989 Dependence on other enabling machines and devices: Secondary | ICD-10-CM

## 2020-06-27 DIAGNOSIS — E1165 Type 2 diabetes mellitus with hyperglycemia: Secondary | ICD-10-CM

## 2020-06-27 NOTE — Patient Instructions (Addendum)
For leg pain: Hold the crestor for 2 weeks to see if this makes it any better If no relief, you might need neurontin/gabapentin for neuropathy   Medication Instructions:  No changes  If you need a refill on your cardiac medications before your next appointment, please call your pharmacy.    Lab work: No new labs needed   If you have labs (blood work) drawn today and your tests are completely normal, you will receive your results only by: Marland Kitchen MyChart Message (if you have MyChart) OR . A paper copy in the mail If you have any lab test that is abnormal or we need to change your treatment, we will call you to review the results.   Testing/Procedures: No new testing needed   Follow-Up: At [facility], you and your health needs are our priority.  As part of our continuing mission to provide you with exceptional heart care, we have created designated Provider Care Teams.  These Care Teams include your primary Cardiologist (physician) and Advanced Practice Providers (APPs -  Physician Assistants and Nurse Practitioners) who all work together to provide you with the care you need, when you need it.  . You will need a follow up appointment in 12 months . We will alternate with [facility] cardiology  . Providers on your designated Care Team:   . Murray Hodgkins, NP . Christell Faith, PA-C . Marrianne Mood, PA-C  Any Other Special Instructions Will Be Listed Below (If Applicable).  COVID-19 Vaccine Information can be found at: ShippingScam.co.uk For questions related to vaccine distribution or appointments, please email [email or web address] or call 9075206275.

## 2020-06-28 DIAGNOSIS — M79605 Pain in left leg: Secondary | ICD-10-CM

## 2020-06-28 DIAGNOSIS — M79604 Pain in right leg: Secondary | ICD-10-CM

## 2020-06-29 ENCOUNTER — Other Ambulatory Visit: Payer: Self-pay

## 2020-06-29 DIAGNOSIS — G4733 Obstructive sleep apnea (adult) (pediatric): Secondary | ICD-10-CM

## 2020-06-29 NOTE — Telephone Encounter (Signed)
Yes, referral to pulmonology please.  Thanks.

## 2020-06-30 DIAGNOSIS — G4733 Obstructive sleep apnea (adult) (pediatric): Secondary | ICD-10-CM | POA: Diagnosis not present

## 2020-06-30 MED ORDER — GABAPENTIN 100 MG PO CAPS: ORAL_CAPSULE | ORAL | 0 refills | Status: DC

## 2020-07-08 DIAGNOSIS — E119 Type 2 diabetes mellitus without complications: Secondary | ICD-10-CM | POA: Diagnosis not present

## 2020-07-12 DIAGNOSIS — E1100 Type 2 diabetes mellitus with hyperosmolarity without nonketotic hyperglycemic-hyperosmolar coma (NKHHC): Secondary | ICD-10-CM | POA: Diagnosis not present

## 2020-07-12 DIAGNOSIS — E782 Mixed hyperlipidemia: Secondary | ICD-10-CM | POA: Diagnosis not present

## 2020-07-12 DIAGNOSIS — I5022 Chronic systolic (congestive) heart failure: Secondary | ICD-10-CM | POA: Diagnosis not present

## 2020-07-12 DIAGNOSIS — I255 Ischemic cardiomyopathy: Secondary | ICD-10-CM | POA: Diagnosis not present

## 2020-07-12 DIAGNOSIS — I25118 Atherosclerotic heart disease of native coronary artery with other forms of angina pectoris: Secondary | ICD-10-CM | POA: Diagnosis not present

## 2020-07-13 ENCOUNTER — Emergency Department: Payer: Medicare Other

## 2020-07-13 ENCOUNTER — Other Ambulatory Visit: Payer: Self-pay

## 2020-07-13 DIAGNOSIS — I517 Cardiomegaly: Secondary | ICD-10-CM | POA: Diagnosis not present

## 2020-07-13 DIAGNOSIS — R42 Dizziness and giddiness: Secondary | ICD-10-CM | POA: Diagnosis not present

## 2020-07-13 DIAGNOSIS — R0602 Shortness of breath: Secondary | ICD-10-CM | POA: Insufficient documentation

## 2020-07-13 DIAGNOSIS — R531 Weakness: Secondary | ICD-10-CM | POA: Insufficient documentation

## 2020-07-13 DIAGNOSIS — Z5321 Procedure and treatment not carried out due to patient leaving prior to being seen by health care provider: Secondary | ICD-10-CM | POA: Diagnosis not present

## 2020-07-13 DIAGNOSIS — J9811 Atelectasis: Secondary | ICD-10-CM | POA: Diagnosis not present

## 2020-07-13 LAB — CBC WITH DIFFERENTIAL/PLATELET
Abs Immature Granulocytes: 0.01 10*3/uL (ref 0.00–0.07)
Basophils Absolute: 0 10*3/uL (ref 0.0–0.1)
Basophils Relative: 0 %
Eosinophils Absolute: 0.1 10*3/uL (ref 0.0–0.5)
Eosinophils Relative: 4 %
HCT: 32.5 % — ABNORMAL LOW (ref 39.0–52.0)
Hemoglobin: 11.5 g/dL — ABNORMAL LOW (ref 13.0–17.0)
Immature Granulocytes: 0 %
Lymphocytes Relative: 26 %
Lymphs Abs: 0.9 10*3/uL (ref 0.7–4.0)
MCH: 33.5 pg (ref 26.0–34.0)
MCHC: 35.4 g/dL (ref 30.0–36.0)
MCV: 94.8 fL (ref 80.0–100.0)
Monocytes Absolute: 0.3 10*3/uL (ref 0.1–1.0)
Monocytes Relative: 8 %
Neutro Abs: 2.2 10*3/uL (ref 1.7–7.7)
Neutrophils Relative %: 62 %
Platelets: 132 10*3/uL — ABNORMAL LOW (ref 150–400)
RBC: 3.43 MIL/uL — ABNORMAL LOW (ref 4.22–5.81)
RDW: 13.4 % (ref 11.5–15.5)
WBC: 3.5 10*3/uL — ABNORMAL LOW (ref 4.0–10.5)
nRBC: 0 % (ref 0.0–0.2)

## 2020-07-13 LAB — BASIC METABOLIC PANEL
Anion gap: 11 (ref 5–15)
BUN: 19 mg/dL (ref 6–20)
CO2: 22 mmol/L (ref 22–32)
Calcium: 9.3 mg/dL (ref 8.9–10.3)
Chloride: 104 mmol/L (ref 98–111)
Creatinine, Ser: 0.92 mg/dL (ref 0.61–1.24)
GFR, Estimated: >60 mL/min (ref >60)
Glucose, Bld: 168 mg/dL — ABNORMAL HIGH (ref 70–99)
Potassium: 3.4 mmol/L — ABNORMAL LOW (ref 3.5–5.1)
Sodium: 137 mmol/L (ref 135–145)

## 2020-07-13 LAB — TROPONIN I (HIGH SENSITIVITY): Troponin I (High Sensitivity): 13 ng/L (ref <18)

## 2020-07-13 NOTE — ED Triage Notes (Addendum)
Pt to ED for chief complaint of [facility] that started this afternoon. States received 3rd COVID vaccine yesterday.  Denies CP Pt speaking in complete sentences, RR even and unlabored, NAD noted.  + dizziness/weakness

## 2020-07-13 NOTE — ED Notes (Signed)
Blue top tube sent down

## 2020-07-14 ENCOUNTER — Telehealth: Payer: Self-pay

## 2020-07-14 ENCOUNTER — Other Ambulatory Visit: Payer: Self-pay | Admitting: Primary Care

## 2020-07-14 ENCOUNTER — Emergency Department
Admission: EM | Admit: 2020-07-14 | Discharge: 2020-07-14 | Disposition: A | Discharge status: Home / Self Care | Payer: Medicare Other | Attending: Student in an Organized Health Care Education/Training Program | Admitting: Student in an Organized Health Care Education/Training Program

## 2020-07-14 DIAGNOSIS — IMO0002 Reserved for concepts with insufficient information to code with codable children: Secondary | ICD-10-CM

## 2020-07-14 DIAGNOSIS — Z125 Encounter for screening for malignant neoplasm of prostate: Secondary | ICD-10-CM

## 2020-07-14 DIAGNOSIS — E1165 Type 2 diabetes mellitus with hyperglycemia: Secondary | ICD-10-CM

## 2020-07-14 DIAGNOSIS — E785 Hyperlipidemia, unspecified: Secondary | ICD-10-CM

## 2020-07-14 NOTE — ED Notes (Signed)
No answer when called several times from lobby 

## 2020-07-14 NOTE — Telephone Encounter (Addendum)
Per chart review tab pt is at [facility] ED. Sending note to Gentry Fitz NP who is out of office but PCP as Juluis Rainier and sending note to Dr Diona Browner who is in office.

## 2020-07-14 NOTE — Telephone Encounter (Signed)
Noted  

## 2020-07-14 NOTE — Telephone Encounter (Signed)
[name] - Client TELEPHONE ADVICE RECORD [facility] Patient Name: Brian Williams Gender: Male DOB: 1961-05-13 Age: 59 Y 65 M 5 D Return Phone Number: [phone] (Primary) Address: City/State/Zip: [place] [address] Client [name] Night - Client Client Site [place] Physician Alma Friendly - NP Contact Type Call Who Is Calling Patient / Member / Family / Caregiver Call Type Triage / Clinical Caller Name Majd Tissue Relationship To Patient Spouse Return Phone Number [phone] (Primary) Chief Complaint BREATHING - shortness of breath or sounds breathless Reason for Call Symptomatic / Request for Health Information Initial Comment Spouse rec'd Moderna booster yesterday. He has chills and sob today. Translation No Nurse Assessment Nurse: Wiser, Therapist, sports, Heidi Date/Time (Eastern Time): 07/13/2020 7:18:26 PM Confirm and document reason for call. If symptomatic, describe symptoms. ---Caller states husband received a Moderna booster shot yesterday. This afternoon patient has chills/body aches/dizzy and SOB with a slight cough. Does the patient have any new or worsening symptoms? ---Yes Will a triage be completed? ---Yes Related visit to physician within the last 2 weeks? ---Yes Does the PT have any chronic conditions? (i.e. diabetes, asthma, this includes High risk factors for pregnancy, etc.) ---Yes List chronic conditions. ---CHF Diabetes Is this a behavioral health or substance abuse call? ---No Guidelines Guideline Title Affirmed Question Affirmed Notes Nurse Date/Time (Eastern Time) COVID-19 - Diagnosed or Suspected MODERATE difficulty breathing (e.g., speaks in phrases, SOB even at rest, pulse 100-120) Wiser, RN, Heidi 07/13/2020 7:20:58 PM Disp. Time Eilene Ghazi Time) Disposition Final User 07/13/2020 7:17:09 PM Send to Urgent Forest[place] Na 07/13/2020 7:26:01 PM Go to ED  Now Yes Wiser, RN, Heidi PLEASE NOTE: All timestamps contained within this report are represented as [place] Standard Time. CONFIDENTIALTY NOTICE: This fax transmission is intended only for the addressee. It contains information that is legally privileged, confidential or otherwise protected from use or disclosure. If you are not the intended recipient, you are strictly prohibited from reviewing, disclosing, copying using or disseminating any of this information or taking any action in reliance on or regarding this information. If you have received this fax in error, please notify us immediately by telephone so that we can arrange for its return to [place]. Phone: [phone], Toll-Free: [phone], Fax: [phone] Page: 2 of 2 Call Id: [id number] [place] Disagree/Comply Comply Caller Understands Yes PreDisposition InappropriateToAsk Care Advice Given Per Guideline GO TO ED NOW: * You need to be seen in the Emergency Department. YOU SHOULD TELL HEALTHCARE PERSONNEL THAT YOU MIGHT HAVE COVID-19: CARE ADVICE given per COVID-19 - DIAGNOSED OR SUSPECTED (Adult) guideline. Referrals GO TO FACILITY UNDECIDED

## 2020-07-18 ENCOUNTER — Telehealth: Payer: Self-pay

## 2020-07-18 NOTE — Telephone Encounter (Signed)
Rocco with [employer] left v/m has questions and request cb using claim # [id number]. I called [employer] and wait time is approx 10 mins. Sending note to [facility] CMA.

## 2020-07-19 ENCOUNTER — Telehealth: Payer: Self-pay | Admitting: Cardiovascular Disease

## 2020-07-19 NOTE — Telephone Encounter (Signed)
Called patient to make him aware we received [employer] forms. Patient was made aware to come in  And fill our the release and make the $29.00 payment. Forms left in pink binder up front

## 2020-07-20 ENCOUNTER — Telehealth: Payer: Self-pay | Admitting: Cardiovascular Disease

## 2020-07-20 DIAGNOSIS — L918 Other hypertrophic disorders of the skin: Secondary | ICD-10-CM | POA: Diagnosis not present

## 2020-07-20 DIAGNOSIS — L83 Acanthosis nigricans: Secondary | ICD-10-CM | POA: Diagnosis not present

## 2020-07-20 NOTE — Telephone Encounter (Signed)
Conversation: Form Completion (Newest Message First) Brian Williams R   TB   07/19/20 9:49 AM Note Called patient to make him aware we received [employer] forms. Patient was made aware to come in  And fill our the release and make the $29.00 payment. Forms left in pink binder up front    [name], Saunders Glance to Reg, [place]   TB   07/19/20 9:47 AM Spoke with pt

## 2020-07-20 NOTE — Telephone Encounter (Signed)
   [employer] was calling to check that our office received a fax that was sent 07/18/20. Please advise   Claim# [id number]

## 2020-07-21 DIAGNOSIS — E1169 Type 2 diabetes mellitus with other specified complication: Secondary | ICD-10-CM | POA: Diagnosis not present

## 2020-07-21 DIAGNOSIS — Z0279 Encounter for issue of other medical certificate: Secondary | ICD-10-CM

## 2020-07-21 DIAGNOSIS — M170 Bilateral primary osteoarthritis of knee: Secondary | ICD-10-CM | POA: Diagnosis not present

## 2020-07-21 DIAGNOSIS — I739 Peripheral vascular disease, unspecified: Secondary | ICD-10-CM | POA: Diagnosis not present

## 2020-07-21 NOTE — Telephone Encounter (Signed)
Patient came by office Filled out forms and paid $29 fee in cash Placed in nurse box to be completed

## 2020-07-21 NOTE — Telephone Encounter (Signed)
Called and spoke to rep they are trying to reach Dr. Caryl Comes with [facility]. Let them know this it not the correct office.

## 2020-07-22 ENCOUNTER — Other Ambulatory Visit: Payer: Self-pay

## 2020-07-22 ENCOUNTER — Other Ambulatory Visit (INDEPENDENT_AMBULATORY_CARE_PROVIDER_SITE_OTHER): Payer: Medicare Other

## 2020-07-22 DIAGNOSIS — M7660 Achilles tendinitis, unspecified leg: Secondary | ICD-10-CM | POA: Diagnosis not present

## 2020-07-22 DIAGNOSIS — M9260 Juvenile osteochondrosis of tarsus, unspecified ankle: Secondary | ICD-10-CM | POA: Diagnosis not present

## 2020-07-22 DIAGNOSIS — E1165 Type 2 diabetes mellitus with hyperglycemia: Secondary | ICD-10-CM

## 2020-07-22 DIAGNOSIS — Z125 Encounter for screening for malignant neoplasm of prostate: Secondary | ICD-10-CM | POA: Diagnosis not present

## 2020-07-22 DIAGNOSIS — E118 Type 2 diabetes mellitus with unspecified complications: Secondary | ICD-10-CM | POA: Diagnosis not present

## 2020-07-22 DIAGNOSIS — I739 Peripheral vascular disease, unspecified: Secondary | ICD-10-CM | POA: Diagnosis not present

## 2020-07-22 DIAGNOSIS — E785 Hyperlipidemia, unspecified: Secondary | ICD-10-CM | POA: Diagnosis not present

## 2020-07-22 DIAGNOSIS — IMO0002 Reserved for concepts with insufficient information to code with codable children: Secondary | ICD-10-CM

## 2020-07-22 DIAGNOSIS — E1142 Type 2 diabetes mellitus with diabetic polyneuropathy: Secondary | ICD-10-CM | POA: Diagnosis not present

## 2020-07-22 DIAGNOSIS — M7661 Achilles tendinitis, right leg: Secondary | ICD-10-CM | POA: Diagnosis not present

## 2020-07-22 LAB — COMPREHENSIVE METABOLIC PANEL
ALT: 25 U/L (ref 0–53)
AST: 18 U/L (ref 0–37)
Albumin: 4.1 g/dL (ref 3.5–5.2)
Alkaline Phosphatase: 41 U/L (ref 39–117)
BUN: 26 mg/dL — ABNORMAL HIGH (ref 6–23)
CO2: 21 mEq/L (ref 19–32)
Calcium: 9.6 mg/dL (ref 8.4–10.5)
Chloride: 104 mEq/L (ref 96–112)
Creatinine, Ser: 1.01 mg/dL (ref 0.40–1.50)
GFR: 81.43 mL/min (ref >60.00)
Glucose, Bld: 257 mg/dL — ABNORMAL HIGH (ref 70–99)
Potassium: 4.4 mEq/L (ref 3.5–5.1)
Sodium: 137 mEq/L (ref 135–145)
Total Bilirubin: 0.3 mg/dL (ref 0.2–1.2)
Total Protein: 6.2 g/dL (ref 6.0–8.3)

## 2020-07-22 LAB — HEMOGLOBIN A1C: Hgb A1c MFr Bld: 8.6 % — ABNORMAL HIGH (ref 4.6–6.5)

## 2020-07-22 LAB — LIPID PANEL
Cholesterol: 125 mg/dL (ref 0–200)
HDL: 32.1 mg/dL — ABNORMAL LOW (ref >39.00)
LDL Cholesterol: 65 mg/dL (ref 0–99)
NonHDL: 93.29
Total CHOL/HDL Ratio: 4
Triglycerides: 139 mg/dL (ref 0.0–149.0)
VLDL: 27.8 mg/dL (ref 0.0–40.0)

## 2020-07-22 LAB — PSA: PSA: 0.11 ng/mL (ref 0.10–4.00)

## 2020-07-22 NOTE — Telephone Encounter (Signed)
[employer] called to make sure the office received the forms. Informed agent that the forms were received

## 2020-07-25 ENCOUNTER — Other Ambulatory Visit: Payer: Self-pay

## 2020-07-25 ENCOUNTER — Ambulatory Visit (INDEPENDENT_AMBULATORY_CARE_PROVIDER_SITE_OTHER): Payer: Medicare Other | Admitting: Primary Care

## 2020-07-25 ENCOUNTER — Encounter: Payer: Self-pay | Admitting: Primary Care

## 2020-07-25 VITALS — BP 110/60 | HR 62 | Ht 72.0 in | Wt 288.0 lb

## 2020-07-25 DIAGNOSIS — Z0000 Encounter for general adult medical examination without abnormal findings: Secondary | ICD-10-CM

## 2020-07-25 DIAGNOSIS — I10 Essential (primary) hypertension: Secondary | ICD-10-CM

## 2020-07-25 DIAGNOSIS — M25561 Pain in right knee: Secondary | ICD-10-CM | POA: Insufficient documentation

## 2020-07-25 DIAGNOSIS — M545 Low back pain, unspecified: Secondary | ICD-10-CM | POA: Diagnosis not present

## 2020-07-25 DIAGNOSIS — IMO0002 Reserved for concepts with insufficient information to code with codable children: Secondary | ICD-10-CM

## 2020-07-25 DIAGNOSIS — G8929 Other chronic pain: Secondary | ICD-10-CM

## 2020-07-25 DIAGNOSIS — G4700 Insomnia, unspecified: Secondary | ICD-10-CM

## 2020-07-25 DIAGNOSIS — Z23 Encounter for immunization: Secondary | ICD-10-CM

## 2020-07-25 DIAGNOSIS — F331 Major depressive disorder, recurrent, moderate: Secondary | ICD-10-CM

## 2020-07-25 DIAGNOSIS — M25562 Pain in left knee: Secondary | ICD-10-CM

## 2020-07-25 DIAGNOSIS — N529 Male erectile dysfunction, unspecified: Secondary | ICD-10-CM

## 2020-07-25 DIAGNOSIS — E7800 Pure hypercholesterolemia, unspecified: Secondary | ICD-10-CM

## 2020-07-25 DIAGNOSIS — F411 Generalized anxiety disorder: Secondary | ICD-10-CM

## 2020-07-25 DIAGNOSIS — I5022 Chronic systolic (congestive) heart failure: Secondary | ICD-10-CM

## 2020-07-25 DIAGNOSIS — E1142 Type 2 diabetes mellitus with diabetic polyneuropathy: Secondary | ICD-10-CM

## 2020-07-25 DIAGNOSIS — E1165 Type 2 diabetes mellitus with hyperglycemia: Secondary | ICD-10-CM

## 2020-07-25 DIAGNOSIS — I25118 Atherosclerotic heart disease of native coronary artery with other forms of angina pectoris: Secondary | ICD-10-CM

## 2020-07-25 DIAGNOSIS — G4733 Obstructive sleep apnea (adult) (pediatric): Secondary | ICD-10-CM

## 2020-07-25 DIAGNOSIS — E118 Type 2 diabetes mellitus with unspecified complications: Secondary | ICD-10-CM

## 2020-07-25 DIAGNOSIS — Z9989 Dependence on other enabling machines and devices: Secondary | ICD-10-CM

## 2020-07-25 MED ORDER — ZOSTER VAC RECOMB ADJUVANTED 50 MCG/0.5ML IM SUSR: 0.5000 mL | Freq: Once | INTRAMUSCULAR | 1 refills | Status: AC

## 2020-07-25 NOTE — Assessment & Plan Note (Signed)
Doing well on Lexapro 20 mg and trazodone 100 mg. Continue same.

## 2020-07-25 NOTE — Assessment & Plan Note (Signed)
Chronic for years, cannot tolerate phosphodiesterase-5 enzyme inhibitors due to side effects of headaches.  Offered urology evaluation for which she currently declines.  Lexapro is likely not helping, but he has had symptoms over 10 years prior to Lexapro use.

## 2020-07-25 NOTE — Assessment & Plan Note (Signed)
Following with cardiology through [facility]. Appears euvolemic today.  Continue carvedilol, spironolactone, Entresto, furosemide.

## 2020-07-25 NOTE — Telephone Encounter (Signed)
Spoke with patient and reviewed that forms have been received and still pending provider to sign and review. They did send in papers again and he paid the fee for [place] to complete. Advised that I still have the original copy still pending his review and signature. Will attach these forms to the ones that we had before and will make him aware.

## 2020-07-25 NOTE — Telephone Encounter (Signed)
Left voicemail message for patient to call back.

## 2020-07-25 NOTE — Assessment & Plan Note (Signed)
Chronic and continued, follows with orthopedics.

## 2020-07-25 NOTE — Patient Instructions (Addendum)
Start exercising. You should be getting 150 minutes of moderate intensity exercise weekly.  Continue to work on a healthy diet.  Take the Shingles vaccine to the pharmacy for administration.   It was a pleasure to see you today!    Preventive Care 59-59 Years Old, Male Preventive care refers to lifestyle choices and visits with your health care provider that can promote health and wellness. This includes:  A yearly physical exam. This is also called an annual well check.  Regular dental and eye exams.  Immunizations.  Screening for certain conditions.  Healthy lifestyle choices, such as eating a healthy diet, getting regular exercise, not using drugs or products that contain nicotine and tobacco, and limiting alcohol use. What can I expect for my preventive care visit? Physical exam Your health care provider will check:  Height and weight. These may be used to calculate body mass index (BMI), which is a measurement that tells if you are at a healthy weight.  Heart rate and blood pressure.  Your skin for abnormal spots. Counseling Your health care provider may ask you questions about:  Alcohol, tobacco, and drug use.  Emotional well-being.  Home and relationship well-being.  Sexual activity.  Eating habits.  Work and work Statistician. What immunizations do I need?  Influenza (flu) vaccine  This is recommended every year. Tetanus, diphtheria, and pertussis (Tdap) vaccine  You may need a Td booster every 10 years. Varicella (chickenpox) vaccine  You may need this vaccine if you have not already been vaccinated. Zoster (shingles) vaccine  You may need this after age 72. Measles, mumps, and rubella (MMR) vaccine  You may need at least one dose of MMR if you were born in 1957 or later. You may also need a second dose. Pneumococcal conjugate (PCV13) vaccine  You may need this if you have certain conditions and were not previously vaccinated. Pneumococcal  polysaccharide (PPSV23) vaccine  You may need one or two doses if you smoke cigarettes or if you have certain conditions. Meningococcal conjugate (MenACWY) vaccine  You may need this if you have certain conditions. Hepatitis A vaccine  You may need this if you have certain conditions or if you travel or work in places where you may be exposed to hepatitis A. Hepatitis B vaccine  You may need this if you have certain conditions or if you travel or work in places where you may be exposed to hepatitis B. Haemophilus influenzae type b (Hib) vaccine  You may need this if you have certain risk factors. Human papillomavirus (HPV) vaccine  If recommended by your health care provider, you may need three doses over 6 months. You may receive vaccines as individual doses or as more than one vaccine together in one shot (combination vaccines). Talk with your health care provider about the risks and benefits of combination vaccines. What tests do I need? Blood tests  Lipid and cholesterol levels. These may be checked every 5 years, or more frequently if you are over 70 years old.  Hepatitis C test.  Hepatitis B test. Screening  Lung cancer screening. You may have this screening every year starting at age 28 if you have a 30-pack-year history of smoking and currently smoke or have quit within the past 15 years.  Prostate cancer screening. Recommendations will vary depending on your family history and other risks.  Colorectal cancer screening. All adults should have this screening starting at age 10 and continuing until age 84. Your health care provider may recommend  screening at age 31 if you are at increased risk. You will have tests every 1-10 years, depending on your results and the type of screening test.  Diabetes screening. This is done by checking your blood sugar (glucose) after you have not eaten for a while (fasting). You may have this done every 1-3 years.  Sexually transmitted  disease (STD) testing. Follow these instructions at home: Eating and drinking  Eat a diet that includes fresh fruits and vegetables, whole grains, lean protein, and low-fat dairy products.  Take vitamin and mineral supplements as recommended by your health care provider.  Do not drink alcohol if your health care provider tells you not to drink.  If you drink alcohol: ? Limit how much you have to 0-2 drinks a day. ? Be aware of how much alcohol is in your drink. In the [place] one drink equals one 12 oz bottle of beer (355 mL), one 5 oz glass of wine (148 mL), or one 1 oz glass of hard liquor (44 mL). Lifestyle  Take daily care of your teeth and gums.  Stay active. Exercise for at least 30 minutes on 5 or more days each week.  Do not use any products that contain nicotine or tobacco, such as cigarettes, e-cigarettes, and chewing tobacco. If you need help quitting, ask your health care provider.  If you are sexually active, practice safe sex. Use a condom or other form of protection to prevent STIs (sexually transmitted infections).  Talk with your health care provider about taking a low-dose aspirin every day starting at age 20. What's next?  Go to your health care provider once a year for a well check visit.  Ask your health care provider how often you should have your eyes and teeth checked.  Stay up to date on all vaccines. This information is not intended to replace advice given to you by your health care provider. Make sure you discuss any questions you have with your health care provider. Document Revised: 08/28/2018 Document Reviewed: 08/28/2018 Elsevier Patient Education  2020 [employer].

## 2020-07-25 NOTE — Assessment & Plan Note (Signed)
Significant improvement compared to last year.  Continue Zetia, rosuvastatin, gemfibrozil.

## 2020-07-25 NOTE — Assessment & Plan Note (Addendum)
Chronic bilaterally. Following through [facility].  Recently received steroid injections and is doing better.

## 2020-07-25 NOTE — Assessment & Plan Note (Signed)
Prescription for Shingrix vaccination provided today.  Other immunizations up-to-date. PSA up-to-date. Colonoscopy up-to-date, due in 2022.  Commended him on improvements in diet, recommended to increase activity level. Exam today stable. Labs reviewed.

## 2020-07-25 NOTE — Assessment & Plan Note (Signed)
Overall doing well on Lexapro 20 mg daily and Trazodone 100 mg HS.   Continue same.

## 2020-07-25 NOTE — Assessment & Plan Note (Signed)
Well controlled in the office today. Continue carvedilol 25 mg BID, furosemide, Entresto 97-103 mg, spironolactone 50mg .

## 2020-07-25 NOTE — Assessment & Plan Note (Signed)
Compliant to CPAP nightly. Continue same.  

## 2020-07-25 NOTE — Assessment & Plan Note (Addendum)
Overall stable, follows with cardiology.  Recent lipid panel much improved compared to last year.  Continue gemfibrozil, Zetia, rosuvastatin, clopidogrel.

## 2020-07-25 NOTE — Assessment & Plan Note (Signed)
Improving after initiation of gabapentin, but not at goal.  Discussed to slowly increase dose of gabapentin to 600 mg at bedtime, continue 300 mg in a.m. and afternoon for now.  He will update.

## 2020-07-25 NOTE — Progress Notes (Signed)
Subjective:    Patient ID: Brian Williams, male    DOB: 11-22-1960, 59 y.o.   MRN: [id number]  HPI  This visit occurred during the SARS-CoV-2 public health emergency.  Safety protocols were in place, including screening questions prior to the visit, additional usage of staff PPE, and extensive cleaning of exam room while observing appropriate contact time as indicated for disinfecting solutions.   Brian Williams is a 58 year old male who presents today for complete physical.   Immunizations: -Tetanus: Completed in 2019 -Influenza: Completed this season  -Shingles: Never completed  -Pneumonia: Completed Pneumovax in 2019 -Covid-19: Completed series with booster  Diet: He endorses a fair diet.  Exercise: He is not exercising.  Eye exam: Due this week.  Dental exam: Completes annually.  Colonoscopy: Completed in 2019, due in 2022 PSA: 0.11 in 2021 Hep C Screen: Negative  BP Readings from Last 3 Encounters:  07/25/20 110/60  07/13/20 (!) 116/42  06/27/20 (!) 100/56   Wt Readings from Last 3 Encounters:  07/25/20 288 lb (130.6 kg)  07/13/20 282 lb 3 oz (128 kg)  06/27/20 283 lb (128.4 kg)      Review of Systems  Constitutional: Negative for unexpected weight change.  HENT: Negative for rhinorrhea.   Respiratory: Negative for cough and shortness of breath.   Cardiovascular: Negative for chest pain.  Gastrointestinal: Negative for constipation and diarrhea.  Genitourinary: Negative for difficulty urinating.       Erectile dysfunction   Musculoskeletal: Positive for arthralgias and back pain.  Skin: Negative for rash.  Allergic/Immunologic: Negative for environmental allergies.  Neurological: Negative for dizziness, numbness and headaches.  Psychiatric/Behavioral: The patient is not nervous/anxious.        Past Medical History:  Diagnosis Date  . AICD (automatic cardioverter/defibrillator) present   . Anxiety    takes Xanax as needed  . Arthritis     back,knees,right shoulder  . Back pain   . Cardiomyopathy ([place])   . CHF (congestive heart failure) (HCC)    takes Lasix and Aldactone daily  . Coronary artery disease    takes Plavix daily  . Depression    takes Zoloft daily  . Diabetes mellitus without complication (HCC)    Humulin R and Farxiga daily.Fasting blood sugar runs 140  . GERD (gastroesophageal reflux disease)   . Headache   . History of bronchitis   . History of colon polyps    benign  . History of hiatal hernia   . Hyperlipidemia    takes Fenofibrate,Crestor, and Zetia daily  . Hypertension    takes Entresto and Coreg daily  . MI (myocardial infarction) ([place]) 2001  . Obesity   . Peripheral neuropathy   . Pneumonia    history of-last time about 14 yrs ago  . PONV (postoperative nausea and vomiting)    after knee surgery 25 yrs ago b/p stayed elevated for a while  . Presence of permanent cardiac pacemaker   . Shortness of breath dyspnea    with exertion  . Sleep apnea    uses CPAP nightly  . Ventricular tachycardia ([place])    s/p RFCA PVCs 2013     Social History   Socioeconomic History  . Marital status: Married    Spouse name: Not on file  . Number of children: Not on file  . Years of education: Not on file  . Highest education level: Not on file  Occupational History  . Not on file  Tobacco Use  .  Smoking status: Former Smoker    Packs/day: 0.25    Years: 25.00    Pack years: 6.25    Types: Cigarettes    Quit date: 05/19/2018    Years since quitting: 2.1  . Smokeless tobacco: Never Used  Vaping Use  . Vaping Use: Never used  Substance and Sexual Activity  . Alcohol use: No    Comment: rare  . Drug use: No  . Sexual activity: Not Currently  Other Topics Concern  . Not on file  Social History Narrative   Married.   Moved from [place].   Disabled.   Social Determinants of Health   Financial Resource Strain:   . Difficulty of Paying Living Expenses: Not on file  Food Insecurity:   .  Worried About Charity fundraiser in the Last Year: Not on file  . Ran Out of Food in the Last Year: Not on file  Transportation Needs:   . Lack of Transportation (Medical): Not on file  . Lack of Transportation (Non-Medical): Not on file  Physical Activity:   . Days of Exercise per Week: Not on file  . Minutes of Exercise per Session: Not on file  Stress:   . Feeling of Stress : Not on file  Social Connections:   . Frequency of Communication with Friends and Family: Not on file  . Frequency of Social Gatherings with Friends and Family: Not on file  . Attends Religious Services: Not on file  . Active Member of Clubs or Organizations: Not on file  . Attends Archivist Meetings: Not on file  . Marital Status: Not on file  Intimate Partner Violence:   . Fear of Current or Ex-Partner: Not on file  . Emotionally Abused: Not on file  . Physically Abused: Not on file  . Sexually Abused: Not on file    Past Surgical History:  Procedure Laterality Date  . BACK SURGERY    . CARDIAC CATHETERIZATION    . CARDIAC CATHETERIZATION Left 05/10/2016   Procedure: Left Heart Cath and Coronary Angiography;  Surgeon: Minna Merritts, MD;  Location: [facility];  Service: Cardiovascular;  Laterality: Left;  . CARDIAC DEFIBRILLATOR PLACEMENT  10/16/2007   ICD Model number 2207-36 serial number [id number]  . CARDIAC ELECTROPHYSIOLOGY STUDY AND ABLATION    . CHOLECYSTECTOMY  2001  . COLONOSCOPY    . COLONOSCOPY WITH PROPOFOL N/A 08/28/2018   Procedure: COLONOSCOPY WITH PROPOFOL;  Surgeon: Jonathon Bellows, MD;  Location: [facility];  Service: Gastroenterology;  Laterality: N/A;  . CORONARY ANGIOPLASTY WITH STENT PLACEMENT     7 stents  . CORONARY ARTERY BYPASS GRAFT N/A 05/24/2016   Procedure: CORONARY ARTERY BYPASS GRAFTING (CABG) x four , using left internal mammary artery and left leg greater saphenous vein harvested endoscopically;  Surgeon: Gaye Pollack, MD;  Location: [facility];   Service: Open Heart Surgery;  Laterality: N/A;  . EP IMPLANTABLE DEVICE N/A 06/16/2015   Procedure: ICD Generator Changeout;  Surgeon: Deboraha Sprang, MD;  Location: [facility];  Service: Cardiovascular;  Laterality: N/A;  . ESOPHAGOGASTRODUODENOSCOPY    . INSERT / REPLACE / REMOVE PACEMAKER    . KNEE SURGERY     bilateral knee   . [name] WITHOUT CARDIOVERSION N/A 05/24/2016   Procedure: TRANSESOPHAGEAL ECHOCARDIOGRAM ([name]);  Surgeon: Gaye Pollack, MD;  Location: [facility];  Service: Open Heart Surgery;  Laterality: N/A;  . VASECTOMY      Family History  Problem Relation  Age of Onset  . Heart attack Mother 29  . Hypertension Mother   . Heart attack Father 4  . Hypertension Father   . Hypertension Maternal Uncle   . Heart disease Maternal Uncle   . Heart disease Maternal Grandmother   . Stroke Maternal Grandmother   . Diabetes Neg Hx     Allergies  Allergen Reactions  . Morphine And Related Nausea And Vomiting    Morphine only  . Naproxen Other (See Comments)    Causes hyperactivity    Current Outpatient Medications on File Prior to Visit  Medication Sig Dispense Refill  . aspirin EC 81 MG tablet Take 81 mg by mouth daily.  90 tablet   . carvedilol (COREG) 25 MG tablet TAKE 1 TABLET BY MOUTH  TWICE DAILY WITH A MEAL 180 tablet 3  . clopidogrel (PLAVIX) 75 MG tablet TAKE ONE TABLET BY MOUTH DAILY 90 tablet 1  . dapagliflozin propanediol (FARXIGA) 10 MG TABS tablet Take 10 mg by mouth daily.    Marland Kitchen escitalopram (LEXAPRO) 20 MG tablet TAKE ONE TABLET BY MOUTH DAILY FOR ANXIETY AND DEPRESSION 90 tablet 1  . ezetimibe (ZETIA) 10 MG tablet TAKE 1 TABLET BY MOUTH  DAILY 90 tablet 0  . furosemide (LASIX) 40 MG tablet TAKE TWO TABLETS BY MOUTH EVERY MORNING AND TAKE ONE TABLET BY MOUTH EVERY EVENING. MAY TAKE 1 TABLET IN THE AFTERNOON AS NEEDED 360 tablet 2  . gabapentin (NEURONTIN) 300 MG capsule Take 300 mg by mouth 3 (three) times daily.    Marland Kitchen gemfibrozil (LOPID) 600 MG tablet TAKE  1 TABLET BY MOUTH  DAILY 90 tablet 3  . hydrOXYzine (ATARAX/VISTARIL) 25 MG tablet Take 1 tablet (25 mg total) by mouth 2 (two) times daily as needed for anxiety. 180 tablet 0  . ibuprofen (ADVIL,MOTRIN) 200 MG tablet Take 600 mg by mouth every 6 (six) hours as needed.    . insulin regular human CONCENTRATED (HUMULIN R) 500 UNIT/ML injection For use in pump, for a total of 130 units/day (Patient taking differently: For use in pump, for a total of 170 units/day) 20 mL 11  . rosuvastatin (CRESTOR) 20 MG tablet TAKE 1 TABLET BY MOUTH AT  BEDTIME FOR CHOLESTEROL 90 tablet 3  . sacubitril-valsartan (ENTRESTO) 97-103 MG Take 1 tablet by mouth 2 (two) times daily.    . Semaglutide,0.25 or 0.5MG /DOS, (OZEMPIC, 0.25 OR 0.5 MG/DOSE,) 2 MG/1.5ML SOPN Inject into the skin.    Marland Kitchen spironolactone (ALDACTONE) 50 MG tablet Take 50 mg by mouth daily.    . traZODone (DESYREL) 100 MG tablet TAKE 2 TABLETS BY MOUTH  EVERY NIGHT AT BEDTIME 180 tablet 3  . [DISCONTINUED] DULoxetine (CYMBALTA) 30 MG capsule Take 1 capsule by mouth for 7 days, then 2 capsules thereafter for depression. 60 capsule 1   No current facility-administered medications on file prior to visit.    BP 110/60   Pulse 62   Ht 6' (1.829 m)   Wt 288 lb (130.6 kg)   SpO2 97%   BMI 39.06 kg/m    Objective:   Physical Exam HENT:     Right Ear: Tympanic membrane and ear canal normal.     Left Ear: Tympanic membrane and ear canal normal.  Eyes:     Pupils: Pupils are equal, round, and reactive to light.  Cardiovascular:     Rate and Rhythm: Normal rate and regular rhythm.  Pulmonary:     Effort: Pulmonary effort is normal.  Breath sounds: Normal breath sounds.  Abdominal:     General: Bowel sounds are normal.     Palpations: Abdomen is soft.     Tenderness: There is no abdominal tenderness.  Musculoskeletal:     Cervical back: Neck supple.     Comments: Decrease in range of motion to bilateral knees  Skin:    General: Skin is warm  and dry.  Neurological:     Mental Status: He is alert and oriented to person, place, and time.     Cranial Nerves: No cranial nerve deficit.     Deep Tendon Reflexes:     Reflex Scores:      Patellar reflexes are 2+ on the right side and 2+ on the left side. Psychiatric:        Mood and Affect: Mood normal.            Assessment & Plan:

## 2020-07-25 NOTE — Assessment & Plan Note (Signed)
Following with endocrinology, recent A1c of 8.6 is significantly improved compared to last year.  Continue high-dose insulin. Currently not on Farxiga or Ozempic due to cost.

## 2020-07-25 NOTE — Assessment & Plan Note (Signed)
Doing well on trazodone for which he takes nightly.  Continue same.

## 2020-07-27 LAB — HM DIABETES EYE EXAM

## 2020-07-27 NOTE — Telephone Encounter (Signed)
Completed forms scanned and then faxed to [employer]

## 2020-08-01 DIAGNOSIS — M79604 Pain in right leg: Secondary | ICD-10-CM | POA: Diagnosis not present

## 2020-08-01 DIAGNOSIS — M79605 Pain in left leg: Secondary | ICD-10-CM | POA: Diagnosis not present

## 2020-08-01 DIAGNOSIS — R278 Other lack of coordination: Secondary | ICD-10-CM | POA: Diagnosis not present

## 2020-08-01 DIAGNOSIS — E1149 Type 2 diabetes mellitus with other diabetic neurological complication: Secondary | ICD-10-CM | POA: Diagnosis not present

## 2020-08-01 NOTE — Telephone Encounter (Signed)
Payment posted and receipt mailed to patient

## 2020-08-02 DIAGNOSIS — D492 Neoplasm of unspecified behavior of bone, soft tissue, and skin: Secondary | ICD-10-CM | POA: Diagnosis not present

## 2020-08-02 DIAGNOSIS — L720 Epidermal cyst: Secondary | ICD-10-CM | POA: Diagnosis not present

## 2020-08-02 DIAGNOSIS — L089 Local infection of the skin and subcutaneous tissue, unspecified: Secondary | ICD-10-CM | POA: Diagnosis not present

## 2020-08-02 DIAGNOSIS — D225 Melanocytic nevi of trunk: Secondary | ICD-10-CM | POA: Diagnosis not present

## 2020-08-05 DIAGNOSIS — E1140 Type 2 diabetes mellitus with diabetic neuropathy, unspecified: Secondary | ICD-10-CM | POA: Insufficient documentation

## 2020-08-05 DIAGNOSIS — W19XXXA Unspecified fall, initial encounter: Secondary | ICD-10-CM | POA: Insufficient documentation

## 2020-08-05 DIAGNOSIS — R278 Other lack of coordination: Secondary | ICD-10-CM | POA: Insufficient documentation

## 2020-08-05 DIAGNOSIS — M79604 Pain in right leg: Secondary | ICD-10-CM | POA: Insufficient documentation

## 2020-08-05 DIAGNOSIS — M79605 Pain in left leg: Secondary | ICD-10-CM | POA: Insufficient documentation

## 2020-08-08 ENCOUNTER — Ambulatory Visit (INDEPENDENT_AMBULATORY_CARE_PROVIDER_SITE_OTHER): Payer: Medicare Other | Admitting: Primary Care

## 2020-08-08 ENCOUNTER — Other Ambulatory Visit: Payer: Self-pay

## 2020-08-08 ENCOUNTER — Encounter: Payer: Self-pay | Admitting: Primary Care

## 2020-08-08 ENCOUNTER — Telehealth: Payer: Self-pay

## 2020-08-08 DIAGNOSIS — G4733 Obstructive sleep apnea (adult) (pediatric): Secondary | ICD-10-CM

## 2020-08-08 DIAGNOSIS — Z9989 Dependence on other enabling machines and devices: Secondary | ICD-10-CM | POA: Diagnosis not present

## 2020-08-08 DIAGNOSIS — J0100 Acute maxillary sinusitis, unspecified: Secondary | ICD-10-CM | POA: Diagnosis not present

## 2020-08-08 DIAGNOSIS — E119 Type 2 diabetes mellitus without complications: Secondary | ICD-10-CM | POA: Diagnosis not present

## 2020-08-08 DIAGNOSIS — J019 Acute sinusitis, unspecified: Secondary | ICD-10-CM | POA: Insufficient documentation

## 2020-08-08 MED ORDER — AZITHROMYCIN 250 MG PO TABS: ORAL_TABLET | ORAL | 0 refills | Status: DC

## 2020-08-08 NOTE — Assessment & Plan Note (Signed)
-   Sinus congestion with maxillary pressure/pain x 1 week - Continue mucienx twice daily and flonase nasal spray - Rx Azithromycin 500mg  today; then 250mg  qd x 4 days  - If no improvement needs to follow-up with PCP

## 2020-08-08 NOTE — Progress Notes (Addendum)
@Patient  ID: Brian Williams, male    DOB: May 08, 1961, 59 y.o.   MRN: [id number]  Chief Complaint  Patient presents with  . sleep consult    per Alma Friendly, NP--prior sleep study 10-15y ago. wearing cpap avg 7-8hr nightly- feels pressure and mask is okay. in need of new cpap machine, current machine is 8y old.     Referring provider: Pleas Koch, NP  HPI: 59 year old male, former smoker quit 2018. PMH significant for OSA on CPAP, chronic systolic heart failure, cardiomyopathy, CAD s/p CABG, HTN, angina, type 2 diabetes, obesity. Patient referred for sleep consult by Alma Friendly.   08/08/2020 Patient presents today for sleep consult. He has a hx sleep apnea on CPAP. He had a prior sleep study 10-15 years ago in [facility] which he states showed severe OSA. He reports compliance with CPAP use, wearing on average 7-8 hours a night. Current pressure setting is 12cm h20. Uses full face mask. No issues with mask fit or pressure setting. Due for new machine, his current one is > 52 years old.  No significant breathing difficulties. He current has sinus congestion/pressure with asscotied teeth ache. He has been taking mucinex and Flonase without relief. He experiences dyspnea on exertion. He is not on oxygen. Pulmonary function testing completed in 2017 showed moderate restrictive lung disease.   Sleep questionnaire: Prior sleep study- 10-15 years ago at [facility] in [place] Symptoms- Restless sleep, daytime fatigue Bedtime- 9-11:30pm Time to fall asleep- 22min -2 hours Waketime- 6am Nocturnal awakenings- 2 Oxygen use- None [place]- 7  [facility] download 07/07/20-08/07/20: 30/30 days used (100%) Average usage 7 hours 21 mins Pressure 12cm h20 AHI 0.7  Allergies  Allergen Reactions  . Morphine And Related Nausea And Vomiting    Morphine only  . Naproxen Other (See Comments)    Causes hyperactivity    Immunization History  Administered  Date(s) Administered  . Influenza Inj Mdck Quad Pf 06/25/2018, 06/05/2019  . Influenza Split 07/22/2015  . Influenza,inj,Quad PF,6+ Mos 06/13/2016, 06/24/2020  . Influenza-Unspecified 06/25/2018, 06/06/2019  . Moderna SARS-COVID-2 Vaccination 10/31/2019, 11/21/2019, 07/12/2020  . Pneumococcal Polysaccharide-23 07/21/2018  . Td 04/17/2018    Past Medical History:  Diagnosis Date  . AICD (automatic cardioverter/defibrillator) present   . Anxiety    takes Xanax as needed  . Arthritis    back,knees,right shoulder  . Back pain   . Cardiomyopathy ([place])   . CHF (congestive heart failure) (HCC)    takes Lasix and Aldactone daily  . Coronary artery disease    takes Plavix daily  . Depression    takes Zoloft daily  . Diabetes mellitus without complication (HCC)    Humulin R and Farxiga daily.Fasting blood sugar runs 140  . GERD (gastroesophageal reflux disease)   . Headache   . History of bronchitis   . History of colon polyps    benign  . History of hiatal hernia   . Hyperlipidemia    takes Fenofibrate,Crestor, and Zetia daily  . Hypertension    takes Entresto and Coreg daily  . MI (myocardial infarction) ([place]) 2001  . Obesity   . Peripheral neuropathy   . Pneumonia    history of-last time about 14 yrs ago  . PONV (postoperative nausea and vomiting)    after knee surgery 25 yrs ago b/p stayed elevated for a while  . Presence of permanent cardiac pacemaker   . Shortness of breath dyspnea    with exertion  .  Sleep apnea    uses CPAP nightly  . Ventricular tachycardia ([place])    s/p RFCA PVCs 2013    Tobacco History: Social History   Tobacco Use  Smoking Status Former Smoker  . Packs/day: 1.00  . Years: 25.00  . Pack years: 25.00  . Types: Cigarettes  . Quit date: 05/19/2018  . Years since quitting: 2.2  Smokeless Tobacco Never Used   Counseling given: Not Answered   Outpatient Medications Prior to Visit  Medication Sig Dispense Refill  . aspirin EC 81 MG tablet  Take 81 mg by mouth daily.  90 tablet   . carvedilol (COREG) 25 MG tablet TAKE 1 TABLET BY MOUTH  TWICE DAILY WITH A MEAL 180 tablet 3  . clopidogrel (PLAVIX) 75 MG tablet TAKE ONE TABLET BY MOUTH DAILY 90 tablet 1  . escitalopram (LEXAPRO) 20 MG tablet TAKE ONE TABLET BY MOUTH DAILY FOR ANXIETY AND DEPRESSION 90 tablet 1  . ezetimibe (ZETIA) 10 MG tablet TAKE 1 TABLET BY MOUTH  DAILY 90 tablet 0  . furosemide (LASIX) 40 MG tablet TAKE TWO TABLETS BY MOUTH EVERY MORNING AND TAKE ONE TABLET BY MOUTH EVERY EVENING. MAY TAKE 1 TABLET IN THE AFTERNOON AS NEEDED 360 tablet 2  . gabapentin (NEURONTIN) 300 MG capsule Take 300 mg by mouth 3 (three) times daily.    Marland Kitchen gemfibrozil (LOPID) 600 MG tablet TAKE 1 TABLET BY MOUTH  DAILY 90 tablet 3  . hydrOXYzine (ATARAX/VISTARIL) 25 MG tablet Take 1 tablet (25 mg total) by mouth 2 (two) times daily as needed for anxiety. 180 tablet 0  . ibuprofen (ADVIL,MOTRIN) 200 MG tablet Take 600 mg by mouth every 6 (six) hours as needed.    . insulin regular human CONCENTRATED (HUMULIN R) 500 UNIT/ML injection For use in pump, for a total of 130 units/day (Patient taking differently: For use in pump, for a total of 170 units/day) 20 mL 11  . rosuvastatin (CRESTOR) 20 MG tablet TAKE 1 TABLET BY MOUTH AT  BEDTIME FOR CHOLESTEROL 90 tablet 3  . sacubitril-valsartan (ENTRESTO) 97-103 MG Take 1 tablet by mouth 2 (two) times daily.    . Semaglutide,0.25 or 0.5MG /DOS, (OZEMPIC, 0.25 OR 0.5 MG/DOSE,) 2 MG/1.5ML SOPN Inject into the skin.    Marland Kitchen spironolactone (ALDACTONE) 50 MG tablet Take 50 mg by mouth daily.    . traZODone (DESYREL) 100 MG tablet TAKE 2 TABLETS BY MOUTH  EVERY NIGHT AT BEDTIME 180 tablet 3  . dapagliflozin propanediol (FARXIGA) 10 MG TABS tablet Take 10 mg by mouth daily.     No facility-administered medications prior to visit.   Review of Systems  Review of Systems  Constitutional: Positive for fatigue.  HENT: Positive for congestion, ear pain, sinus pressure  and sinus pain.   Respiratory: Negative.   Cardiovascular: Negative.    Physical Exam  BP 110/60 (BP Location: Left Arm, Cuff Size: Normal)   Temp (!) 97 F (36.1 C) (Temporal)   Ht 6' (1.829 m)   Wt 278 lb (126.1 kg)   SpO2 99%   BMI 37.70 kg/m  Physical Exam Constitutional:      General: He is not in acute distress.    Appearance: Normal appearance. He is obese. He is not ill-appearing.  HENT:     Right Ear: Hearing normal. No drainage or swelling. A middle ear effusion is present.     Left Ear: Hearing normal. There is impacted cerumen.     Nose:     Right Sinus:  Maxillary sinus tenderness present.     Left Sinus: Maxillary sinus tenderness present.     Mouth/Throat:     Mouth: Mucous membranes are moist.  Cardiovascular:     Rate and Rhythm: Normal rate and regular rhythm.  Pulmonary:     Effort: Pulmonary effort is normal.     Breath sounds: Normal breath sounds.  Neurological:     Mental Status: He is alert.      Lab Results:  CBC    Component Value Date/Time   WBC 3.5 (L) 07/13/2020 2158   RBC 3.43 (L) 07/13/2020 2158   HGB 11.5 (L) 07/13/2020 2158   HGB 12.1 (L) 05/04/2016 1200   HCT 32.5 (L) 07/13/2020 2158   HCT 36.5 (L) 05/04/2016 1200   PLT 132 (L) 07/13/2020 2158   PLT 202 05/04/2016 1200   MCV 94.8 07/13/2020 2158   MCV 94 05/04/2016 1200   MCH 33.5 07/13/2020 2158   MCHC 35.4 07/13/2020 2158   RDW 13.4 07/13/2020 2158   RDW 14.9 05/04/2016 1200   LYMPHSABS 0.9 07/13/2020 2158   LYMPHSABS 2.2 05/04/2016 1200   MONOABS 0.3 07/13/2020 2158   EOSABS 0.1 07/13/2020 2158   EOSABS 0.3 05/04/2016 1200   BASOSABS 0.0 07/13/2020 2158   BASOSABS 0.0 05/04/2016 1200    BMET    Component Value Date/Time   NA 137 07/22/2020 0859   NA 137 04/05/2020 1013   K 4.4 07/22/2020 0859   CL 104 07/22/2020 0859   CO2 21 07/22/2020 0859   GLUCOSE 257 (H) 07/22/2020 0859   BUN 26 (H) 07/22/2020 0859   BUN 19 04/05/2020 1013   CREATININE 1.01 07/22/2020  0859   CALCIUM 9.6 07/22/2020 0859   GFRNONAA >60 07/13/2020 2158   GFRAA 111 04/05/2020 1013    BNP No results found for: BNP  ProBNP No results found for: PROBNP  Imaging: DG Chest 2 View  Result Date: 07/13/2020 CLINICAL DATA:  Shortness of breath, onset this afternoon. Dizziness and weakness. EXAM: CHEST - 2 VIEW COMPARISON:  07/26/2016 FINDINGS: Left-sided pacemaker in place. Post median sternotomy. Chronic cardiomegaly, unchanged from prior allowing for differences in technique. No pulmonary edema. Subsegmental opacity in the lingula. No confluent consolidation. No pleural fluid or pneumothorax. Thoracic spondylosis. No acute osseous abnormalities are seen. IMPRESSION: Chronic cardiomegaly. Subsegmental lingular atelectasis. Electronically Signed   By: Keith Rake M.D.   On: 07/13/2020 22:16     Assessment & Plan:   OSA on CPAP - Hx severe OSA on CPAP.  Needs new machine. Patient is 100% compliance with use; Pressure 12cm h20, residual AHI 0.7. Requesting sleep study results from [facility] in [place]. Once we gets these will send in order for new CPAP, continue pressure setting at 12cm h20. No changes today. FU in 6 months with Dr. Ephriam Jenkins.   Sinusitis, acute - Sinus congestion with maxillary pressure/pain x 1 week - Continue mucienx twice daily and flonase nasal spray - Rx Azithromycin 500mg  today; then 250mg  qd x 4 days  - If no improvement needs to follow-up with PCP   Martyn Ehrich, NP 08/08/2020

## 2020-08-08 NOTE — Assessment & Plan Note (Deleted)
-   Sinus congestion and pressure x 1 week - Contineu mucienx and flonase - Sending in Azithromycin

## 2020-08-08 NOTE — Telephone Encounter (Signed)
Records request for sleep study results has been faxed to [facility].

## 2020-08-08 NOTE — Assessment & Plan Note (Signed)
-   Hx severe OSA on CPAP.  Needs new machine. Patient is 100% compliance with use; Pressure 12cm h20, residual AHI 0.7. Requesting sleep study results from [facility] in [place]. Once we gets these will send in order for new CPAP, continue pressure setting at 12cm h20. No changes today. FU in 6 months with Dr. Ephriam Jenkins.

## 2020-08-08 NOTE — Patient Instructions (Addendum)
Sinusitis: - Sending in antibiotic - Take Coricidin over the counter (decongestant free for high blood pressure) - Continue mucinex twice daily  - Continue Flonase nasal spray   Sleep apnea: - Continue to wear CPAP every night for 4-6 hours or more - Do not drive if experiencing excessive daytime fatigue or somnolence   Orders: - Needs new CPAP machine, continue pressure 12cm h20  - Please get sleep study results from [facility] in [place]   Follow-up - 6 months with Dr. Ephriam Jenkins     Sinusitis, Adult Sinusitis is soreness and swelling (inflammation) of your sinuses. Sinuses are hollow spaces in the bones around your face. They are located:  Around your eyes.  In the middle of your forehead.  Behind your nose.  In your cheekbones. Your sinuses and nasal passages are lined with a fluid called mucus. Mucus drains out of your sinuses. Swelling can trap mucus in your sinuses. This lets germs (bacteria, virus, or fungus) grow, which leads to infection. Most of the time, this condition is caused by a virus. What are the causes? This condition is caused by:  Allergies.  Asthma.  Germs.  Things that block your nose or sinuses.  Growths in the nose (nasal polyps).  Chemicals or irritants in the air.  Fungus (rare). What increases the risk? You are more likely to develop this condition if:  You have a weak body defense system (immune system).  You do a lot of swimming or diving.  You use nasal sprays too much.  You smoke. What are the signs or symptoms? The main symptoms of this condition are pain and a feeling of pressure around the sinuses. Other symptoms include:  Stuffy nose (congestion).  Runny nose (drainage).  Swelling and warmth in the sinuses.  Headache.  Toothache.  A cough that may get worse at night.  Mucus that collects in the throat or the back of the nose (postnasal drip).  Being unable to smell and  taste.  Being very tired (fatigue).  A fever.  Sore throat.  Bad breath. How is this diagnosed? This condition is diagnosed based on:  Your symptoms.  Your medical history.  A physical exam.  Tests to find out if your condition is short-term (acute) or long-term (chronic). Your doctor may: ? Check your nose for growths (polyps). ? Check your sinuses using a tool that has a light (endoscope). ? Check for allergies or germs. ? Do imaging tests, such as an MRI or CT scan. How is this treated? Treatment for this condition depends on the cause and whether it is short-term or long-term.  If caused by a virus, your symptoms should go away on their own within 10 days. You may be given medicines to relieve symptoms. They include: ? Medicines that shrink swollen tissue in the nose. ? Medicines that treat allergies (antihistamines). ? A spray that treats swelling of the nostrils. ? Rinses that help get rid of thick mucus in your nose (nasal saline washes).  If caused by bacteria, your doctor may wait to see if you will get better without treatment. You may be given antibiotic medicine if you have: ? A very bad infection. ? A weak body defense system.  If caused by growths in the nose, you may need to have surgery. Follow these instructions at home: Medicines  Take, use, or apply over-the-counter and prescription medicines only as told by your doctor. These may include nasal sprays.  If you were  prescribed an antibiotic medicine, take it as told by your doctor. Do not stop taking the antibiotic even if you start to feel better. Hydrate and humidify   Drink enough water to keep your pee (urine) pale yellow.  Use a cool mist humidifier to keep the humidity level in your home above 50%.  Breathe in steam for 10-15 minutes, 3-4 times a day, or as told by your doctor. You can do this in the bathroom while a hot shower is running.  Try not to spend time in cool or dry  air. Rest  Rest as much as you can.  Sleep with your head raised (elevated).  Make sure you get enough sleep each night. General instructions   Put a warm, moist washcloth on your face 3-4 times a day, or as often as told by your doctor. This will help with discomfort.  Wash your hands often with soap and water. If there is no soap and water, use hand sanitizer.  Do not smoke. Avoid being around people who are smoking (secondhand smoke).  Keep all follow-up visits as told by your doctor. This is important. Contact a doctor if:  You have a fever.  Your symptoms get worse.  Your symptoms do not get better within 10 days. Get help right away if:  You have a very bad headache.  You cannot stop throwing up (vomiting).  You have very bad pain or swelling around your face or eyes.  You have trouble seeing.  You feel confused.  Your neck is stiff.  You have trouble breathing. Summary  Sinusitis is swelling of your sinuses. Sinuses are hollow spaces in the bones around your face.  This condition is caused by tissues in your nose that become inflamed or swollen. This traps germs. These can lead to infection.  If you were prescribed an antibiotic medicine, take it as told by your doctor. Do not stop taking it even if you start to feel better.  Keep all follow-up visits as told by your doctor. This is important. This information is not intended to replace advice given to you by your health care provider. Make sure you discuss any questions you have with your health care provider. Document Revised: 02/03/2018 Document Reviewed: 02/03/2018 Elsevier Patient Education  [place].

## 2020-08-09 ENCOUNTER — Ambulatory Visit (INDEPENDENT_AMBULATORY_CARE_PROVIDER_SITE_OTHER): Payer: Medicare Other

## 2020-08-09 DIAGNOSIS — I255 Ischemic cardiomyopathy: Secondary | ICD-10-CM | POA: Diagnosis not present

## 2020-08-09 LAB — CUP PACEART REMOTE DEVICE CHECK
Battery Remaining Longevity: 51 mo
Battery Remaining Percentage: 53 %
Battery Voltage: 2.93 V
Brady Statistic AP VP Percent: 1 %
Brady Statistic AP VS Percent: 1 %
Brady Statistic AS VP Percent: 8.7 %
Brady Statistic AS VS Percent: 90 %
Brady Statistic RA Percent Paced: 1 %
Brady Statistic RV Percent Paced: 8.9 %
Date Time Interrogation Session: 20211123020022
HighPow Impedance: 43 Ohm
HighPow Impedance: 43 Ohm
Implantable Lead Implant Date: 20160929
Implantable Lead Implant Date: 20160929
Implantable Lead Location: 753859
Implantable Lead Location: 753860
Implantable Lead Model: 7071
Implantable Pulse Generator Implant Date: 20160929
Lead Channel Impedance Value: 350 Ohm
Lead Channel Impedance Value: 400 Ohm
Lead Channel Pacing Threshold Amplitude: 0.75 V
Lead Channel Pacing Threshold Amplitude: 1 V
Lead Channel Pacing Threshold Pulse Width: 0.4 ms
Lead Channel Pacing Threshold Pulse Width: 0.5 ms
Lead Channel Sensing Intrinsic Amplitude: 11.7 mV
Lead Channel Sensing Intrinsic Amplitude: 4.2 mV
Lead Channel Setting Pacing Amplitude: 2 V
Lead Channel Setting Pacing Amplitude: 2.5 V
Lead Channel Setting Pacing Pulse Width: 0.5 ms
Lead Channel Setting Sensing Sensitivity: 0.5 mV
Pulse Gen Serial Number: 7306375

## 2020-08-10 NOTE — Progress Notes (Signed)
Reviewed and agree with assessment/plan.   Chesley Mires, MD [facility] Pulmonary/Critical Care 08/10/2020, 7:25 PM Pager:  [phone]

## 2020-08-11 ENCOUNTER — Other Ambulatory Visit: Payer: Self-pay | Admitting: Primary Care

## 2020-08-11 DIAGNOSIS — F411 Generalized anxiety disorder: Secondary | ICD-10-CM

## 2020-08-11 DIAGNOSIS — F331 Major depressive disorder, recurrent, moderate: Secondary | ICD-10-CM

## 2020-08-16 NOTE — Progress Notes (Signed)
Remote ICD transmission.   

## 2020-08-17 NOTE — Telephone Encounter (Signed)
[facility]

## 2020-08-19 NOTE — Telephone Encounter (Signed)
I dont think he should need a new sleep study. Prior sleep study was in 2013 at [facility] in [place]. Download showed he is compliant with CPAP at 12cm h20 with residual AHI 0.7.   Can we call [facility] and ask if he would need this to be repeat, let them know he had one done in 2013 but we do not have results. He is compliant with CPAP. If they say he does please order HST

## 2020-08-19 NOTE — Telephone Encounter (Signed)
Spoke to patient, who stated that he has attempted to obtain sleep study results. He is unsure if ordering provider is still at previous location sleep study was preformed in 2013. He is questioning if he should repeat since it been so long.   Beth, please advise. Thanks

## 2020-08-19 NOTE — Telephone Encounter (Signed)
Pt called to see if we re'd sleep study results from [facility]. Informed pt that we refaxed request on 08/17/20. Pt wants to know if it'll easier to just redo a sleep study. Please advise.

## 2020-08-19 NOTE — Telephone Encounter (Signed)
Brian Williams, can you help with this?  Will repeat sleep study be needed. Patient is compliant with cpap. Last sleep study was 2013.

## 2020-08-19 NOTE — Telephone Encounter (Signed)
Pt called back and states he requested a medical release of information from [facility]. [facility]. States he just wanted to make [place] aware and does not need called back

## 2020-08-22 NOTE — Telephone Encounter (Signed)
Spoke to patient for update.  Patient stated that he located provider that ordered sleep study originally. Providers office is going to send him a release of information via email to obtain sleep study.  He will call back with update in the next day or so.

## 2020-08-22 NOTE — Telephone Encounter (Signed)
Margie if he has ask for his sleep study from [facility] how long should we wait. I will need home sleep test order to proceed if we don't get sleep study results

## 2020-08-25 ENCOUNTER — Telehealth: Payer: Self-pay | Admitting: Primary Care

## 2020-08-25 NOTE — Telephone Encounter (Signed)
Yes this is fine. Ok to go ahead and order HST  RE: OSA

## 2020-08-25 NOTE — Telephone Encounter (Signed)
Please see 08/08/2020 phone note.  ° °

## 2020-08-25 NOTE — Telephone Encounter (Signed)
Spoke to patient via telephone.    He would like to proceed with HST since we are having no success with obtaining sleep study report.  Beth, please advise. Thanks

## 2020-08-25 NOTE — Telephone Encounter (Signed)
Lm for update.  

## 2020-08-25 NOTE — Telephone Encounter (Signed)
HST has been ordered. Patient is aware and voiced his understanding.  Nothing further needed.

## 2020-09-01 ENCOUNTER — Other Ambulatory Visit: Payer: Self-pay | Admitting: Cardiovascular Disease

## 2020-09-08 DIAGNOSIS — E119 Type 2 diabetes mellitus without complications: Secondary | ICD-10-CM | POA: Diagnosis not present

## 2020-09-13 ENCOUNTER — Other Ambulatory Visit: Payer: Self-pay

## 2020-09-13 ENCOUNTER — Ambulatory Visit: Payer: Medicare Other

## 2020-09-13 DIAGNOSIS — G4733 Obstructive sleep apnea (adult) (pediatric): Secondary | ICD-10-CM | POA: Diagnosis not present

## 2020-09-14 DIAGNOSIS — E119 Type 2 diabetes mellitus without complications: Secondary | ICD-10-CM | POA: Diagnosis not present

## 2020-09-21 ENCOUNTER — Telehealth: Payer: Self-pay | Admitting: Primary Care

## 2020-09-21 DIAGNOSIS — G4733 Obstructive sleep apnea (adult) (pediatric): Secondary | ICD-10-CM | POA: Diagnosis not present

## 2020-09-21 NOTE — Telephone Encounter (Signed)
Patient is aware of below results. He voiced his understanding and had no further questions.  Order has been placed to local DME per patient request.  Nothing further needed.

## 2020-09-21 NOTE — Telephone Encounter (Signed)
Patient had home sleep study 09/13/20 that confirmed diagnosis of severe obstructive sleep apnea.  AHI was 56.9/hour with an SPO2 low of 82%.  Please send an order for new CPAP machine to DME company.  Continue CPAP pressure of 12 cm H2O.

## 2020-09-21 NOTE — Telephone Encounter (Signed)
Pt returning missed call. [phone]

## 2020-09-21 NOTE — Telephone Encounter (Signed)
Lm for patient.  

## 2020-09-22 ENCOUNTER — Telehealth: Payer: Self-pay | Admitting: Primary Care

## 2020-09-22 NOTE — Telephone Encounter (Signed)
Spoke to patient, who would like to know what model of cpap he will receive.  Advised patient that DME determines model of machine.  He voiced his understanding and had no further questions.  Nothing further needed.

## 2020-09-22 NOTE — Telephone Encounter (Signed)
Lm for patient.  

## 2020-09-22 NOTE — Addendum Note (Signed)
Addended by: Lajoyce Lauber A on: 09/22/2020 03:02 PM   Modules accepted: Orders

## 2020-10-04 DIAGNOSIS — G8929 Other chronic pain: Secondary | ICD-10-CM

## 2020-10-04 DIAGNOSIS — M545 Low back pain, unspecified: Secondary | ICD-10-CM

## 2020-10-04 LAB — HM DIABETES EYE EXAM

## 2020-10-05 MED ORDER — GABAPENTIN 600 MG PO TABS: 600.0000 mg | ORAL_TABLET | Freq: Two times a day (BID) | ORAL | 0 refills | Status: DC

## 2020-10-06 ENCOUNTER — Telehealth: Payer: Self-pay | Admitting: Primary Care

## 2020-10-06 NOTE — Telephone Encounter (Signed)
Pt called in wanted to know about getting a prescription for gabapentin to be sent to [place] [address], [phone]

## 2020-10-07 ENCOUNTER — Other Ambulatory Visit: Payer: Self-pay

## 2020-10-07 DIAGNOSIS — E1165 Type 2 diabetes mellitus with hyperglycemia: Secondary | ICD-10-CM | POA: Diagnosis not present

## 2020-10-07 DIAGNOSIS — G8929 Other chronic pain: Secondary | ICD-10-CM

## 2020-10-07 DIAGNOSIS — M545 Low back pain, unspecified: Secondary | ICD-10-CM

## 2020-10-07 DIAGNOSIS — Z6837 Body mass index (BMI) 37.0-37.9, adult: Secondary | ICD-10-CM | POA: Diagnosis not present

## 2020-10-07 DIAGNOSIS — Z794 Long term (current) use of insulin: Secondary | ICD-10-CM | POA: Diagnosis not present

## 2020-10-07 DIAGNOSIS — E669 Obesity, unspecified: Secondary | ICD-10-CM | POA: Diagnosis not present

## 2020-10-07 DIAGNOSIS — I509 Heart failure, unspecified: Secondary | ICD-10-CM | POA: Diagnosis not present

## 2020-10-07 MED ORDER — GABAPENTIN 600 MG PO TABS: 600.0000 mg | ORAL_TABLET | Freq: Two times a day (BID) | ORAL | 0 refills | Status: DC

## 2020-10-07 NOTE — Telephone Encounter (Signed)
Noted  

## 2020-10-07 NOTE — Telephone Encounter (Signed)
FYI called [facility] cancelled script that was sent in. Patient wanted sent to new pharmacy due to cost. I have sent to [facility]. Called patient l/m that has been sent.

## 2020-10-12 DIAGNOSIS — E669 Obesity, unspecified: Secondary | ICD-10-CM | POA: Diagnosis not present

## 2020-10-12 DIAGNOSIS — I110 Hypertensive heart disease with heart failure: Secondary | ICD-10-CM | POA: Diagnosis not present

## 2020-10-12 DIAGNOSIS — I25118 Atherosclerotic heart disease of native coronary artery with other forms of angina pectoris: Secondary | ICD-10-CM | POA: Diagnosis not present

## 2020-10-12 DIAGNOSIS — Z95810 Presence of automatic (implantable) cardiac defibrillator: Secondary | ICD-10-CM | POA: Diagnosis not present

## 2020-10-12 DIAGNOSIS — I5022 Chronic systolic (congestive) heart failure: Secondary | ICD-10-CM | POA: Diagnosis not present

## 2020-10-12 DIAGNOSIS — Z6837 Body mass index (BMI) 37.0-37.9, adult: Secondary | ICD-10-CM | POA: Diagnosis not present

## 2020-10-12 DIAGNOSIS — I10 Essential (primary) hypertension: Secondary | ICD-10-CM | POA: Diagnosis not present

## 2020-10-12 DIAGNOSIS — E782 Mixed hyperlipidemia: Secondary | ICD-10-CM | POA: Diagnosis not present

## 2020-10-12 DIAGNOSIS — I255 Ischemic cardiomyopathy: Secondary | ICD-10-CM | POA: Diagnosis not present

## 2020-10-12 DIAGNOSIS — E1100 Type 2 diabetes mellitus with hyperosmolarity without nonketotic hyperglycemic-hyperosmolar coma (NKHHC): Secondary | ICD-10-CM | POA: Diagnosis not present

## 2020-10-12 DIAGNOSIS — G4733 Obstructive sleep apnea (adult) (pediatric): Secondary | ICD-10-CM | POA: Diagnosis not present

## 2020-10-24 DIAGNOSIS — M170 Bilateral primary osteoarthritis of knee: Secondary | ICD-10-CM | POA: Diagnosis not present

## 2020-11-08 ENCOUNTER — Ambulatory Visit (INDEPENDENT_AMBULATORY_CARE_PROVIDER_SITE_OTHER): Payer: Medicare Other

## 2020-11-08 DIAGNOSIS — I255 Ischemic cardiomyopathy: Secondary | ICD-10-CM | POA: Diagnosis not present

## 2020-11-08 LAB — CUP PACEART REMOTE DEVICE CHECK
Battery Remaining Longevity: 49 mo
Battery Remaining Percentage: 52 %
Battery Voltage: 2.93 V
Brady Statistic AP VP Percent: 1 %
Brady Statistic AP VS Percent: 1 %
Brady Statistic AS VP Percent: 10 %
Brady Statistic AS VS Percent: 89 %
Brady Statistic RA Percent Paced: 1 %
Brady Statistic RV Percent Paced: 11 %
Date Time Interrogation Session: 20220222020032
HighPow Impedance: 38 Ohm
HighPow Impedance: 38 Ohm
Implantable Lead Implant Date: 20160929
Implantable Lead Implant Date: 20160929
Implantable Lead Location: 753859
Implantable Lead Location: 753860
Implantable Lead Model: 7071
Implantable Pulse Generator Implant Date: 20160929
Lead Channel Impedance Value: 340 Ohm
Lead Channel Impedance Value: 350 Ohm
Lead Channel Pacing Threshold Amplitude: 0.75 V
Lead Channel Pacing Threshold Amplitude: 1 V
Lead Channel Pacing Threshold Pulse Width: 0.4 ms
Lead Channel Pacing Threshold Pulse Width: 0.5 ms
Lead Channel Sensing Intrinsic Amplitude: 3.2 mV
Lead Channel Sensing Intrinsic Amplitude: 7.6 mV
Lead Channel Setting Pacing Amplitude: 2 V
Lead Channel Setting Pacing Amplitude: 2.5 V
Lead Channel Setting Pacing Pulse Width: 0.5 ms
Lead Channel Setting Sensing Sensitivity: 0.5 mV
Pulse Gen Serial Number: 7306375

## 2020-11-09 DIAGNOSIS — E118 Type 2 diabetes mellitus with unspecified complications: Secondary | ICD-10-CM | POA: Diagnosis not present

## 2020-11-09 DIAGNOSIS — Z794 Long term (current) use of insulin: Secondary | ICD-10-CM | POA: Diagnosis not present

## 2020-11-15 ENCOUNTER — Other Ambulatory Visit: Payer: Self-pay | Admitting: Primary Care

## 2020-11-15 DIAGNOSIS — N529 Male erectile dysfunction, unspecified: Secondary | ICD-10-CM

## 2020-11-15 MED ORDER — SILDENAFIL CITRATE 20 MG PO TABS: ORAL_TABLET | ORAL | 0 refills | Status: DC

## 2020-11-16 NOTE — Progress Notes (Signed)
Remote ICD transmission.   

## 2020-11-17 ENCOUNTER — Telehealth: Payer: Self-pay

## 2020-11-17 NOTE — Telephone Encounter (Signed)
  Brian Williams [name] - Rx #: [id number] Need help? Call us at [phone] Status Sent to [facility] Sildenafil Citrate 20MG  tablets Form [employer] [place] Medicare Part D Electronic Request Form (CB) Original Claim Info [phone] MD CALL [phone] DETERMINATION REQUIREDFOR VERIFICATION OF A MEDICARE PART DMEDICALLY ACCEPTED INDICATION.   Brian Williams ([name])  Your information has been submitted to [place]. [employer] [place] will review the request and notify you of the determination decision directly, typically within 3 business days of your submission and once all necessary information is received.  You will also receive your request decision electronically. To check for an update later, open the request again from your dashboard.  If [employer] [place] has not responded within the specified timeframe or if you have any questions about your PA submission, contact [place] directly at [facility]) [phone] or ([place]) [phone].

## 2020-11-18 NOTE — Telephone Encounter (Signed)
Holly from [place] [phone] called and LVM on triage line that stated that the PA for Sildenafil was denied. She stated that if appeal was needed it can be faxed to [phone] or call [phone].

## 2020-11-21 ENCOUNTER — Other Ambulatory Visit: Payer: Self-pay

## 2020-11-21 ENCOUNTER — Ambulatory Visit (INDEPENDENT_AMBULATORY_CARE_PROVIDER_SITE_OTHER): Payer: Medicare Other | Admitting: Family Medicine

## 2020-11-21 ENCOUNTER — Ambulatory Visit (INDEPENDENT_AMBULATORY_CARE_PROVIDER_SITE_OTHER)
Admission: RE | Admit: 2020-11-21 | Discharge: 2020-11-21 | Disposition: A | Discharge status: Home / Self Care | Payer: Medicare Other | Source: Ambulatory Visit | Attending: Family Medicine | Admitting: Family Medicine

## 2020-11-21 ENCOUNTER — Encounter: Payer: Self-pay | Admitting: Family Medicine

## 2020-11-21 VITALS — BP 110/62 | HR 74 | Temp 98.6°F | Ht 72.0 in | Wt 279.0 lb

## 2020-11-21 DIAGNOSIS — M542 Cervicalgia: Secondary | ICD-10-CM | POA: Diagnosis not present

## 2020-11-21 DIAGNOSIS — H53149 Visual discomfort, unspecified: Secondary | ICD-10-CM | POA: Diagnosis not present

## 2020-11-21 NOTE — Patient Instructions (Addendum)
If symptoms suddenly become severe let us know and go to the ER  If any facial droop/trouble speaking or vision loss also   Neck xray now  A gentle warm compress on neck as needed for 10 minutes is fine   We will get back to you with a result and plan

## 2020-11-21 NOTE — Assessment & Plan Note (Signed)
Middle of neck radiating to base of skull Episodic and happens upon standing last 10-15 minutes  No change with movement Some photophobia/ dizziness -not always at the same time  Nl neck exam  Bilateral nystagmus noted / no neuro deficits Rev old carotid doppler and last CT head Cs xray now  Disc ER parameters Plan to follow

## 2020-11-21 NOTE — Progress Notes (Signed)
Subjective:    Patient ID: Brian Williams, male    DOB: 28-Mar-1961, 60 y.o.   MRN: [id number]  This visit occurred during the SARS-CoV-2 public health emergency.  Safety protocols were in place, including screening questions prior to the visit, additional usage of staff PPE, and extensive cleaning of exam room while observing appropriate contact time as indicated for disinfecting solutions.    HPI 60 yo pt of NP [facility] presents for neck pain   Has h/o CAD and CHF   Wt Readings from Last 3 Encounters:  11/21/20 279 lb (126.6 kg)  08/08/20 278 lb (126.1 kg)  07/25/20 288 lb (130.6 kg)   37.84 kg/m  Started 3-4 weeks ago  Pain in neck from base of shoulders up to the base of skull (episodic for 10-15 minutes at a time)  Happened when he stood up  Thought it may be a pinched nerve  Can be sharp to burning    Now it hurts more until he sits and puts head back/relaxes  Makes him a bit dizzy and ears feel clogged  Some ringing in ears also -for a few minutes at a time No headaches   At first flexing neck would relieve it -now it position does not matter   Feels good to use a donut neck pillow in the chair    No falls /no head trauma  Did have teeth pulled and he is getting used to dentures/that has been tough   A little more sensitive to bright light  Is weird   No past h/o neck problems  Has OA in knees   occ palpitations  Heart problems have been stable    BP Readings from Last 3 Encounters:  11/21/20 110/62  08/08/20 110/60  07/25/20 110/60   Pulse Readings from Last 3 Encounters:  11/21/20 74  07/25/20 62  07/13/20 83   Pulse ox is 98%     None of meds are new   Takes gabapentin for neuropathy in legs   9/17 carotid study Summary:   - Findings are consistent with a 1-39 percent stenosis involving  the right internal carotid artery and the left internal carotid  artery.  - Bilateral ABI&'s and waveforms suggest normal results at rest.   CT  head 2017 CT Head Wo Contrast (Accession [id number]) (Order [id number]) Imaging Date: 11/09/2015 Department: [facility] Released By: Clearance Coots, RN (auto-released) Authorizing: Delman Kitten, MD    Exam Status  Status  Final [99]   PACS Intelerad Image Link  Show images for CT Head Wo Contrast  Study Result  Narrative & Impression  CLINICAL DATA:  Patient fell inside the house today while picking up dog. Bruise on left side of scalp.  EXAM: CT HEAD WITHOUT CONTRAST  TECHNIQUE: Contiguous axial images were obtained from the base of the skull through the vertex without intravenous contrast.  COMPARISON:  None.  FINDINGS: There is no evidence for acute hemorrhage, hydrocephalus, mass lesion, or abnormal extra-axial fluid collection. No definite CT evidence for acute infarction. The visualized paranasal sinuses and mastoid air cells are clear. No evidence for skull fracture.  IMPRESSION: Normal exam.   Electronically Signed   By: [name] M.D.   On: 11/09/2015 20:27     Goes for eye exam once yearly  No dm eye dz  No glaucoma   Patient Active Problem List   Diagnosis Date Noted  . Neck pain 11/21/2020  . Photophobia 11/21/2020  .  Sinusitis, acute 08/08/2020  . Chronic knee pain 07/25/2020  . Penile irritation 10/14/2019  . Myalgia 01/28/2019  . Dry skin 07/08/2018  . Major depressive disorder 07/08/2017  . Erectile dysfunction 07/08/2017  . Chest pain with moderate risk for cardiac etiology 07/26/2016  . Hx of CABG 07/26/2016  . Coronary artery disease 05/24/2016  . Atherosclerosis of native coronary artery of native heart with stable angina pectoris ([place])   . History of coronary artery stent placement   . Unstable angina ([place]) 05/04/2016  . Obesity (BMI 30-39.9) 04/06/2016  . Memory disturbance 01/06/2016  . OSA on CPAP 01/06/2016  . Diabetes, polyneuropathy ([place]) 01/06/2016  . Insomnia  01/06/2016  . Hidradenitis suppurativa 10/06/2015  . Preventative health care 10/06/2015  . Chronic back pain 05/02/2015  . Essential hypertension 05/02/2015  . Hyperlipidemia 05/02/2015  . Generalized anxiety disorder 05/02/2015  . ICD (implantable cardioverter-defibrillator) in place 04/20/2015  . Congestive dilated cardiomyopathy ([place]) 04/20/2015  . Diabetes mellitus type 2, uncontrolled, with complications ([place]) 64/40/3474  . Chronic systolic heart failure ([place]) 09/30/2014   Past Medical History:  Diagnosis Date  . AICD (automatic cardioverter/defibrillator) present   . Anxiety    takes Xanax as needed  . Arthritis    back,knees,right shoulder  . Back pain   . Cardiomyopathy ([place])   . CHF (congestive heart failure) (H[facility])    takes Lasix and Aldactone daily  . Coronary artery disease    takes Plavix daily  . Depression    takes Zoloft daily  . Diabetes mellitus without complication ([facility])    Humulin R and Farxiga daily.Fasting blood sugar runs 140  . GERD (gastroesophageal reflux disease)   . Headache   . History of bronchitis   . History of colon polyps    benign  . History of hiatal hernia   . Hyperlipidemia    takes Fenofibrate,Crestor, and Zetia daily  . Hypertension    takes Entresto and Coreg daily  . MI (myocardial infarction) ([place]) 2001  . Obesity   . Peripheral neuropathy   . Pneumonia    history of-last time about 14 yrs ago  . PONV (postoperative nausea and vomiting)    after knee surgery 25 yrs ago b/p stayed elevated for a while  . Presence of permanent cardiac pacemaker   . Shortness of breath dyspnea    with exertion  . Sleep apnea    uses CPAP nightly  . Ventricular tachycardia ([place])    s/p RFCA PVCs 2013   Past Surgical History:  Procedure Laterality Date  . BACK SURGERY    . CARDIAC CATHETERIZATION    . CARDIAC CATHETERIZATION Left 05/10/2016   Procedure: Left Heart Cath and Coronary Angiography;  Surgeon: Minna Merritts, MD;  Location:  [facility];  Service: Cardiovascular;  Laterality: Left;  . CARDIAC DEFIBRILLATOR PLACEMENT  10/16/2007   ICD Model number 2207-36 serial number [id number]  . CARDIAC ELECTROPHYSIOLOGY STUDY AND ABLATION    . CHOLECYSTECTOMY  2001  . COLONOSCOPY    . COLONOSCOPY WITH PROPOFOL N/A 08/28/2018   Procedure: COLONOSCOPY WITH PROPOFOL;  Surgeon: Jonathon Bellows, MD;  Location[facility];  Service: Gastroenterology;  Laterality: N/A;  . CORONARY ANGIOPLASTY WITH STENT PLACEMENT     7 stents  . CORONARY ARTERY BYPASS GRAFT N/A 05/24/2016   Procedure: CORONARY ARTERY BYPASS GRAFTING (CABG) x four , using left internal mammary artery and left leg greater saphenous vein harvested endoscopically;  Surgeon: Gaye Pollack, MD;  Location: [facility];  Service: Open Heart Surgery;  Laterality: N/A;  . EP IMPLANTABLE DEVICE N/A 06/16/2015   Procedure: ICD Generator Changeout;  Surgeon: Deboraha Sprang, MD;  Location: [facility];  Service: Cardiovascular;  Laterality: N/A;  . ESOPHAGOGASTRODUODENOSCOPY    . INSERT / REPLACE / REMOVE PACEMAKER    . KNEE SURGERY     bilateral knee   . [name] WITHOUT CARDIOVERSION N/A 05/24/2016   Procedure: TRANSESOPHAGEAL ECHOCARDIOGRAM ([name]);  Surgeon: Gaye Pollack, MD;  Location: [facility];  Service: Open Heart Surgery;  Laterality: N/A;  . VASECTOMY     Social History   Tobacco Use  . Smoking status: Former Smoker    Packs/day: 1.00    Years: 25.00    Pack years: 25.00    Types: Cigarettes    Quit date: 05/19/2018    Years since quitting: 2.5  . Smokeless tobacco: Never Used  Vaping Use  . Vaping Use: Never used  Substance Use Topics  . Alcohol use: No    Comment: rare  . Drug use: No   Family History  Problem Relation Age of Onset  . Heart attack Mother 60  . Hypertension Mother   . Heart attack Father 65  . Hypertension Father   . Hypertension Maternal Uncle   . Heart disease Maternal Uncle   . Heart disease Maternal Grandmother   . Stroke Maternal  Grandmother   . Diabetes Neg Hx    Allergies  Allergen Reactions  . Morphine And Related Nausea And Vomiting    Morphine only  . Naproxen Other (See Comments)    Causes hyperactivity   Current Outpatient Medications on File Prior to Visit  Medication Sig Dispense Refill  . aspirin EC 81 MG tablet Take 81 mg by mouth daily.  90 tablet   . carvedilol (COREG) 25 MG tablet TAKE 1 TABLET BY MOUTH  TWICE DAILY WITH A MEAL 180 tablet 3  . clopidogrel (PLAVIX) 75 MG tablet TAKE ONE TABLET BY MOUTH DAILY 90 tablet 1  . escitalopram (LEXAPRO) 20 MG tablet TAKE 1 TABLET BY MOUTH  DAILY FOR ANXIETY AND  DEPRESSION 90 tablet 3  . ezetimibe (ZETIA) 10 MG tablet TAKE 1 TABLET BY MOUTH  DAILY 90 tablet 2  . furosemide (LASIX) 40 MG tablet TAKE TWO TABLETS BY MOUTH EVERY MORNING AND TAKE ONE TABLET BY MOUTH EVERY EVENING. MAY TAKE 1 TABLET IN THE AFTERNOON AS NEEDED 360 tablet 2  . gabapentin (NEURONTIN) 600 MG tablet Take 1 tablet (600 mg total) by mouth 2 (two) times daily. For pain. 180 tablet 0  . gemfibrozil (LOPID) 600 MG tablet TAKE 1 TABLET BY MOUTH  DAILY 90 tablet 3  . hydrOXYzine (ATARAX/VISTARIL) 25 MG tablet Take 1 tablet (25 mg total) by mouth 2 (two) times daily as needed for anxiety. 180 tablet 0  . ibuprofen (ADVIL,MOTRIN) 200 MG tablet Take 600 mg by mouth every 6 (six) hours as needed.    . insulin regular human CONCENTRATED (HUMULIN R) 500 UNIT/ML injection For use in pump, for a total of 130 units/day (Patient taking differently: For use in pump, for a total of 170 units/day) 20 mL 11  . rosuvastatin (CRESTOR) 20 MG tablet TAKE 1 TABLET BY MOUTH AT  BEDTIME FOR CHOLESTEROL 90 tablet 3  . sacubitril-valsartan (ENTRESTO) 97-103 MG Take 1 tablet by mouth 2 (two) times daily.    . sildenafil (REVATIO) 20 MG tablet Take 2 to 5 tablets by mouth 1 hour prior to intercourse as  needed. 50 tablet 0  . spironolactone (ALDACTONE) 50 MG tablet Take 50 mg by mouth daily.    . traZODone (DESYREL) 100  MG tablet TAKE 2 TABLETS BY MOUTH  EVERY NIGHT AT BEDTIME 180 tablet 3  . [DISCONTINUED] DULoxetine (CYMBALTA) 30 MG capsule Take 1 capsule by mouth for 7 days, then 2 capsules thereafter for depression. 60 capsule 1   No current facility-administered medications on file prior to visit.    Review of Systems  Constitutional: Negative for activity change, appetite change, fatigue, fever and unexpected weight change.  HENT: Negative for congestion, rhinorrhea, sore throat and trouble swallowing.        Occ ear fullness with dizziness  Eyes: Positive for photophobia. Negative for pain, redness, itching and visual disturbance.  Respiratory: Negative for cough, chest tightness, shortness of breath and wheezing.   Cardiovascular: Negative for chest pain, palpitations and leg swelling.  Gastrointestinal: Negative for abdominal pain, blood in stool, constipation, diarrhea and nausea.  Endocrine: Negative for cold intolerance, heat intolerance, polydipsia and polyuria.  Genitourinary: Negative for difficulty urinating, dysuria, frequency and urgency.  Musculoskeletal: Positive for neck pain. Negative for arthralgias, joint swelling, myalgias and neck stiffness.  Skin: Negative for pallor and rash.  Neurological: Positive for dizziness. Negative for tremors, weakness, numbness and headaches.       Neck pain radiates to back of head  Hematological: Negative for adenopathy. Does not bruise/bleed easily.  Psychiatric/Behavioral: Negative for decreased concentration and dysphoric mood. The patient is not nervous/anxious.        Objective:   Physical Exam Constitutional:      General: He is not in acute distress.    Appearance: Normal appearance. He is well-developed and well-nourished. He is obese. He is not ill-appearing or diaphoretic.  HENT:     Head: Normocephalic and atraumatic.     Right Ear: Tympanic membrane, ear canal and external ear normal.     Left Ear: Tympanic membrane, ear canal and  external ear normal.     Nose: Nose normal.     Mouth/Throat:     Mouth: Oropharynx is clear and moist.     Pharynx: No oropharyngeal exudate.      Comments: No sinus tenderness No temporal tenderness  No TMJ tendernessEyes:     General: No scleral icterus.       Right eye: No discharge.        Left eye: No discharge.     Extraocular Movements: EOM normal.     Conjunctiva/sclera: Conjunctivae normal.     Pupils: Pupils are equal, round, and reactive to light.     Comments: Bilateral nystagmus 2-3 beats (horizontal)   Neck:     Thyroid: No thyromegaly.     Vascular: No carotid bruit or JVD.     Trachea: No tracheal deviation.  Cardiovascular:     Rate and Rhythm: Normal rate and regular rhythm.     Heart sounds: Normal heart sounds. No murmur heard.   Pulmonary:     Effort: Pulmonary effort is normal. No respiratory distress.     Breath sounds: Normal breath sounds. No wheezing or rales.  Abdominal:     General: Bowel sounds are normal. There is no distension.     Palpations: Abdomen is soft. There is no mass.     Tenderness: There is no abdominal tenderness.  Musculoskeletal:        General: No tenderness or edema.     Cervical back: Full passive range of motion  without pain, normal range of motion and neck supple. Crepitus present. No swelling, deformity, erythema, signs of trauma, rigidity, spasms, torticollis, tenderness or bony tenderness. No pain with movement. Normal range of motion.     Comments: Nl rom spine No trapezius or TS tenderness Nl rom of UEs No neuro changes   Lymphadenopathy:     Cervical: No cervical adenopathy.  Skin:    General: Skin is warm and dry.     Coloration: Skin is not jaundiced or pale.     Findings: No bruising, erythema or rash.  Neurological:     Mental Status: He is alert and oriented to person, place, and time.     Cranial Nerves: No cranial nerve deficit, dysarthria or facial asymmetry.     Sensory: No sensory deficit.     Motor:  No weakness, tremor, atrophy or abnormal muscle tone.     Coordination: He displays a negative Romberg sign. Coordination is intact. Coordination normal.     Gait: Gait is intact. Gait normal.     Deep Tendon Reflexes: Strength normal and reflexes are normal and symmetric. Reflexes normal.     Comments: No focal cerebellar signs   Psychiatric:        Mood and Affect: Mood and affect and mood normal.        Behavior: Behavior normal.        Thought Content: Thought content normal.        Cognition and Memory: Cognition and memory normal.           Assessment & Plan:   Problem List Items Addressed This Visit      Other   Neck pain - Primary    Middle of neck radiating to base of skull Episodic and happens upon standing last 10-15 minutes  No change with movement Some photophobia/ dizziness -not always at the same time  Nl neck exam  Bilateral nystagmus noted / no neuro deficits Rev old carotid doppler and last CT head Cs xray now  Disc ER parameters Plan to follow       Relevant Orders   DG Cervical Spine Complete   Photophobia    Recently  In setting of episodic neck pain  utd eye care /no retinopathy

## 2020-11-21 NOTE — Telephone Encounter (Signed)
Do you want me to do appeal?

## 2020-11-21 NOTE — Assessment & Plan Note (Signed)
Recently  In setting of episodic neck pain  utd eye care /no retinopathy

## 2020-11-22 ENCOUNTER — Telehealth: Payer: Self-pay | Admitting: Family Medicine

## 2020-11-22 DIAGNOSIS — M542 Cervicalgia: Secondary | ICD-10-CM

## 2020-11-22 DIAGNOSIS — H53149 Visual discomfort, unspecified: Secondary | ICD-10-CM

## 2020-11-22 DIAGNOSIS — R42 Dizziness and giddiness: Secondary | ICD-10-CM

## 2020-11-22 NOTE — Telephone Encounter (Signed)
Can we notify patient first? This may be cheaper if he has it filled at [facility].

## 2020-11-22 NOTE — Telephone Encounter (Signed)
Called patient was able to pick up from [facility] for 8 bucks. No further action needed.

## 2020-11-22 NOTE — Telephone Encounter (Signed)
Please let pt know that I discussed his symptoms with his pcp and think he should see a neurologist about his symptoms  There is a neurologic condition called spontaneous intracranial hypotension that can cause symptoms when standing (usually a headache but that could translate to neck pain) Not sure if that is it but would like a specialist to evaluate  I would like to refer to neurology -please see if agreeable and let me know   Will cc to pcp

## 2020-11-23 DIAGNOSIS — R42 Dizziness and giddiness: Secondary | ICD-10-CM | POA: Insufficient documentation

## 2020-11-23 NOTE — Telephone Encounter (Signed)
Referral done  If I am not here and you need to discuss with a provider Brian Williams is pcp

## 2020-11-23 NOTE — Telephone Encounter (Signed)
Patient advised.

## 2020-11-23 NOTE — Telephone Encounter (Signed)
Noted, thank you everyone!!!

## 2020-11-23 NOTE — Telephone Encounter (Signed)
Left message for patient to call me back. Patient is already established with Dr Lannie Fields office. I got him scheduled for 12/01/20 at 8: 30 am at their office-next week. He is also placed on cancellation list with their office and a note was sent back to the nurse to see if patient can be seen even sooner.

## 2020-11-23 NOTE — Telephone Encounter (Signed)
Patient left a message on voicemail requesting a call back regarding his x-ray done Monday   Patient notified as instructed by telephone and verbalized understanding. Patient stated that he is okay with the referral and would prefer to see someone in [place]. Advised patient that he will hear back from one of our referral coordinators about the referral.

## 2020-11-25 ENCOUNTER — Telehealth: Payer: Self-pay

## 2020-11-25 NOTE — Telephone Encounter (Signed)
Pt called stating that he has been having watery stools x 5 weeks... pt has tried OTC Kaopectate and Imodium with no relief... Patient denies nausea, vomiting, abd bloating or blood in stools... pt reports he is hearing growling noises from abdomen... Per pt's availability I have scheduled pt for Wed at 9:40 in the meantime.... Please advise

## 2020-11-25 NOTE — Telephone Encounter (Signed)
Spoke with patient who stated that he has not started any new medications or antibiotics. Patient stated that he had surgery to remove his teeth last month so he has been eating mostly soft foods with little to no fiber. However, this started two weeks prior to his surgery. Instructed him of Allie Bossier, NP recommendations. Instructed patient that if symptoms became worse to notify us. Patient verbalized understanding.

## 2020-11-25 NOTE — Telephone Encounter (Signed)
Any new medications? Any recent antibiotic use?  Would recommend he add fiber to his diet, could also try taking some fiber agents to bulk up the stool such as metamucil.  Avoid spicy/acidic/irritative foods. Avoid caffeine drinks.  We will see him as scheduled.

## 2020-11-28 NOTE — Telephone Encounter (Signed)
You have not seen for this. Want me to make app? Let him know you are out of the office today

## 2020-11-29 DIAGNOSIS — G4730 Sleep apnea, unspecified: Secondary | ICD-10-CM | POA: Diagnosis not present

## 2020-11-29 DIAGNOSIS — Z794 Long term (current) use of insulin: Secondary | ICD-10-CM | POA: Diagnosis not present

## 2020-11-29 DIAGNOSIS — E118 Type 2 diabetes mellitus with unspecified complications: Secondary | ICD-10-CM | POA: Diagnosis not present

## 2020-11-30 ENCOUNTER — Ambulatory Visit: Payer: Medicare Other | Admitting: Primary Care

## 2020-12-01 DIAGNOSIS — M542 Cervicalgia: Secondary | ICD-10-CM | POA: Diagnosis not present

## 2020-12-01 DIAGNOSIS — R42 Dizziness and giddiness: Secondary | ICD-10-CM | POA: Diagnosis not present

## 2020-12-01 DIAGNOSIS — R278 Other lack of coordination: Secondary | ICD-10-CM | POA: Diagnosis not present

## 2020-12-01 DIAGNOSIS — H539 Unspecified visual disturbance: Secondary | ICD-10-CM | POA: Insufficient documentation

## 2020-12-01 DIAGNOSIS — R202 Paresthesia of skin: Secondary | ICD-10-CM | POA: Insufficient documentation

## 2020-12-01 DIAGNOSIS — M79604 Pain in right leg: Secondary | ICD-10-CM | POA: Diagnosis not present

## 2020-12-06 ENCOUNTER — Other Ambulatory Visit: Payer: Self-pay

## 2020-12-06 ENCOUNTER — Emergency Department: Payer: Medicare Other

## 2020-12-06 ENCOUNTER — Inpatient Hospital Stay
Admission: EM | Admit: 2020-12-06 | Discharge: 2020-12-08 | DRG: 683 | Disposition: A | Discharge status: Home / Self Care | Payer: Medicare Other | Attending: Internal Medicine | Admitting: Internal Medicine

## 2020-12-06 ENCOUNTER — Encounter: Payer: Self-pay | Admitting: Primary Care

## 2020-12-06 ENCOUNTER — Ambulatory Visit (INDEPENDENT_AMBULATORY_CARE_PROVIDER_SITE_OTHER): Payer: Medicare Other | Admitting: Primary Care

## 2020-12-06 VITALS — BP 80/50 | HR 70 | Temp 97.9°F | Ht 72.0 in | Wt 268.8 lb

## 2020-12-06 DIAGNOSIS — G4733 Obstructive sleep apnea (adult) (pediatric): Secondary | ICD-10-CM

## 2020-12-06 DIAGNOSIS — E861 Hypovolemia: Secondary | ICD-10-CM | POA: Diagnosis present

## 2020-12-06 DIAGNOSIS — Z955 Presence of coronary angioplasty implant and graft: Secondary | ICD-10-CM | POA: Diagnosis not present

## 2020-12-06 DIAGNOSIS — Z95810 Presence of automatic (implantable) cardiac defibrillator: Secondary | ICD-10-CM | POA: Diagnosis present

## 2020-12-06 DIAGNOSIS — I420 Dilated cardiomyopathy: Secondary | ICD-10-CM | POA: Diagnosis not present

## 2020-12-06 DIAGNOSIS — I2510 Atherosclerotic heart disease of native coronary artery without angina pectoris: Secondary | ICD-10-CM | POA: Diagnosis present

## 2020-12-06 DIAGNOSIS — E1142 Type 2 diabetes mellitus with diabetic polyneuropathy: Secondary | ICD-10-CM | POA: Diagnosis not present

## 2020-12-06 DIAGNOSIS — F32A Depression, unspecified: Secondary | ICD-10-CM | POA: Diagnosis not present

## 2020-12-06 DIAGNOSIS — E1165 Type 2 diabetes mellitus with hyperglycemia: Secondary | ICD-10-CM | POA: Diagnosis not present

## 2020-12-06 DIAGNOSIS — Z87891 Personal history of nicotine dependence: Secondary | ICD-10-CM

## 2020-12-06 DIAGNOSIS — I9589 Other hypotension: Secondary | ICD-10-CM | POA: Diagnosis present

## 2020-12-06 DIAGNOSIS — I5022 Chronic systolic (congestive) heart failure: Secondary | ICD-10-CM | POA: Diagnosis not present

## 2020-12-06 DIAGNOSIS — R159 Full incontinence of feces: Secondary | ICD-10-CM | POA: Diagnosis present

## 2020-12-06 DIAGNOSIS — N179 Acute kidney failure, unspecified: Principal | ICD-10-CM

## 2020-12-06 DIAGNOSIS — Z7902 Long term (current) use of antithrombotics/antiplatelets: Secondary | ICD-10-CM | POA: Diagnosis not present

## 2020-12-06 DIAGNOSIS — F419 Anxiety disorder, unspecified: Secondary | ICD-10-CM | POA: Diagnosis not present

## 2020-12-06 DIAGNOSIS — E872 Acidosis: Secondary | ICD-10-CM | POA: Diagnosis present

## 2020-12-06 DIAGNOSIS — I10 Essential (primary) hypertension: Secondary | ICD-10-CM | POA: Diagnosis present

## 2020-12-06 DIAGNOSIS — D638 Anemia in other chronic diseases classified elsewhere: Secondary | ICD-10-CM | POA: Diagnosis present

## 2020-12-06 DIAGNOSIS — F411 Generalized anxiety disorder: Secondary | ICD-10-CM | POA: Diagnosis present

## 2020-12-06 DIAGNOSIS — R198 Other specified symptoms and signs involving the digestive system and abdomen: Secondary | ICD-10-CM | POA: Diagnosis not present

## 2020-12-06 DIAGNOSIS — I255 Ischemic cardiomyopathy: Secondary | ICD-10-CM | POA: Diagnosis present

## 2020-12-06 DIAGNOSIS — I959 Hypotension, unspecified: Secondary | ICD-10-CM

## 2020-12-06 DIAGNOSIS — E7800 Pure hypercholesterolemia, unspecified: Secondary | ICD-10-CM

## 2020-12-06 DIAGNOSIS — Z951 Presence of aortocoronary bypass graft: Secondary | ICD-10-CM

## 2020-12-06 DIAGNOSIS — E11649 Type 2 diabetes mellitus with hypoglycemia without coma: Secondary | ICD-10-CM | POA: Diagnosis not present

## 2020-12-06 DIAGNOSIS — R152 Fecal urgency: Secondary | ICD-10-CM | POA: Diagnosis present

## 2020-12-06 DIAGNOSIS — E860 Dehydration: Secondary | ICD-10-CM | POA: Diagnosis not present

## 2020-12-06 DIAGNOSIS — Z9049 Acquired absence of other specified parts of digestive tract: Secondary | ICD-10-CM

## 2020-12-06 DIAGNOSIS — Z6835 Body mass index (BMI) 35.0-35.9, adult: Secondary | ICD-10-CM

## 2020-12-06 DIAGNOSIS — Z20822 Contact with and (suspected) exposure to covid-19: Secondary | ICD-10-CM | POA: Diagnosis present

## 2020-12-06 DIAGNOSIS — Z79899 Other long term (current) drug therapy: Secondary | ICD-10-CM

## 2020-12-06 DIAGNOSIS — I110 Hypertensive heart disease with heart failure: Secondary | ICD-10-CM | POA: Diagnosis not present

## 2020-12-06 DIAGNOSIS — E785 Hyperlipidemia, unspecified: Secondary | ICD-10-CM | POA: Diagnosis present

## 2020-12-06 DIAGNOSIS — K219 Gastro-esophageal reflux disease without esophagitis: Secondary | ICD-10-CM | POA: Diagnosis not present

## 2020-12-06 DIAGNOSIS — Z9989 Dependence on other enabling machines and devices: Secondary | ICD-10-CM

## 2020-12-06 DIAGNOSIS — E871 Hypo-osmolality and hyponatremia: Secondary | ICD-10-CM | POA: Diagnosis not present

## 2020-12-06 DIAGNOSIS — Z7982 Long term (current) use of aspirin: Secondary | ICD-10-CM

## 2020-12-06 DIAGNOSIS — Z794 Long term (current) use of insulin: Secondary | ICD-10-CM

## 2020-12-06 DIAGNOSIS — J811 Chronic pulmonary edema: Secondary | ICD-10-CM | POA: Diagnosis not present

## 2020-12-06 DIAGNOSIS — R197 Diarrhea, unspecified: Secondary | ICD-10-CM | POA: Insufficient documentation

## 2020-12-06 DIAGNOSIS — A0811 Acute gastroenteropathy due to Norwalk agent: Secondary | ICD-10-CM | POA: Diagnosis not present

## 2020-12-06 DIAGNOSIS — E118 Type 2 diabetes mellitus with unspecified complications: Secondary | ICD-10-CM | POA: Diagnosis present

## 2020-12-06 DIAGNOSIS — Z9641 Presence of insulin pump (external) (internal): Secondary | ICD-10-CM | POA: Diagnosis present

## 2020-12-06 DIAGNOSIS — I252 Old myocardial infarction: Secondary | ICD-10-CM

## 2020-12-06 LAB — COMPREHENSIVE METABOLIC PANEL
ALT: 28 U/L (ref 0–44)
AST: 19 U/L (ref 15–41)
Albumin: 4.7 g/dL (ref 3.5–5.0)
Alkaline Phosphatase: 35 U/L — ABNORMAL LOW (ref 38–126)
Anion gap: 14 (ref 5–15)
BUN: 108 mg/dL — ABNORMAL HIGH (ref 6–20)
CO2: 14 mmol/L — ABNORMAL LOW (ref 22–32)
Calcium: 9.6 mg/dL (ref 8.9–10.3)
Chloride: 104 mmol/L (ref 98–111)
Creatinine, Ser: 2.83 mg/dL — ABNORMAL HIGH (ref 0.61–1.24)
GFR, Estimated: 25 mL/min — ABNORMAL LOW (ref >60)
Glucose, Bld: 75 mg/dL (ref 70–99)
Potassium: 4.6 mmol/L (ref 3.5–5.1)
Sodium: 132 mmol/L — ABNORMAL LOW (ref 135–145)
Total Bilirubin: 0.7 mg/dL (ref 0.3–1.2)
Total Protein: 7.6 g/dL (ref 6.5–8.1)

## 2020-12-06 LAB — MAGNESIUM: Magnesium: 2.5 mg/dL — ABNORMAL HIGH (ref 1.7–2.4)

## 2020-12-06 LAB — FERRITIN: Ferritin: 364 ng/mL — ABNORMAL HIGH (ref 24–336)

## 2020-12-06 LAB — RESP PANEL BY RT-PCR (FLU A&B, COVID) ARPGX2
Influenza A by PCR: NEGATIVE
Influenza B by PCR: NEGATIVE
SARS Coronavirus 2 by RT PCR: NEGATIVE

## 2020-12-06 LAB — CBC
HCT: 27.4 % — ABNORMAL LOW (ref 39.0–52.0)
Hemoglobin: 9.7 g/dL — ABNORMAL LOW (ref 13.0–17.0)
MCH: 32.7 pg (ref 26.0–34.0)
MCHC: 35.4 g/dL (ref 30.0–36.0)
MCV: 92.3 fL (ref 80.0–100.0)
Platelets: 142 10*3/uL — ABNORMAL LOW (ref 150–400)
RBC: 2.97 MIL/uL — ABNORMAL LOW (ref 4.22–5.81)
RDW: 12.5 % (ref 11.5–15.5)
WBC: 9 10*3/uL (ref 4.0–10.5)
nRBC: 0 % (ref 0.0–0.2)

## 2020-12-06 LAB — LACTIC ACID, PLASMA
Lactic Acid, Venous: 1 mmol/L (ref 0.5–1.9)
Lactic Acid, Venous: 1 mmol/L (ref 0.5–1.9)

## 2020-12-06 LAB — PHOSPHORUS: Phosphorus: 6.4 mg/dL — ABNORMAL HIGH (ref 2.5–4.6)

## 2020-12-06 LAB — IRON AND TIBC
Iron: 231 ug/dL — ABNORMAL HIGH (ref 45–182)
Saturation Ratios: 47 % — ABNORMAL HIGH (ref 17.9–39.5)
TIBC: 487 ug/dL — ABNORMAL HIGH (ref 250–450)
UIBC: 256 ug/dL

## 2020-12-06 LAB — RETICULOCYTES
Immature Retic Fract: 6.5 % (ref 2.3–15.9)
RBC.: 2.94 MIL/uL — ABNORMAL LOW (ref 4.22–5.81)
Retic Count, Absolute: 24.4 10*3/uL (ref 19.0–186.0)
Retic Ct Pct: 0.8 % (ref 0.4–3.1)

## 2020-12-06 LAB — FOLATE: Folate: 12 ng/mL (ref >5.9)

## 2020-12-06 LAB — CBG MONITORING, ED
Glucose-Capillary: 169 mg/dL — ABNORMAL HIGH (ref 70–99)
Glucose-Capillary: 50 mg/dL — ABNORMAL LOW (ref 70–99)

## 2020-12-06 LAB — CK: Total CK: 43 U/L — ABNORMAL LOW (ref 49–397)

## 2020-12-06 LAB — LIPASE, BLOOD: Lipase: 35 U/L (ref 11–51)

## 2020-12-06 MED ORDER — TRAZODONE HCL 100 MG PO TABS
100.0000 mg | ORAL_TABLET | Freq: Every day | ORAL | Status: DC
  Administered 2020-12-06 – 2020-12-07 (×2): 100 mg via ORAL
  Filled 2020-12-06 (×2): qty 1

## 2020-12-06 MED ORDER — HYDROCODONE-ACETAMINOPHEN 5-325 MG PO TABS
1.0000 | ORAL_TABLET | ORAL | Status: DC | PRN
Start: 2020-12-06 — End: 2020-12-08

## 2020-12-06 MED ORDER — DEXTROSE 50 % IV SOLN
1.0000 | Freq: Once | INTRAVENOUS | Status: AC
  Administered 2020-12-06: 50 mL via INTRAVENOUS
  Filled 2020-12-06: qty 50

## 2020-12-06 MED ORDER — ACETAMINOPHEN 325 MG PO TABS
650.0000 mg | ORAL_TABLET | Freq: Four times a day (QID) | ORAL | Status: DC | PRN
  Filled 2020-12-06: qty 2

## 2020-12-06 MED ORDER — CLOPIDOGREL BISULFATE 75 MG PO TABS
75.0000 mg | ORAL_TABLET | Freq: Every day | ORAL | Status: DC
  Administered 2020-12-07 – 2020-12-08 (×2): 75 mg via ORAL
  Filled 2020-12-06 (×2): qty 1

## 2020-12-06 MED ORDER — SODIUM CHLORIDE 0.9 % IV BOLUS
500.0000 mL | Freq: Once | INTRAVENOUS | Status: AC
  Administered 2020-12-06: 500 mL via INTRAVENOUS

## 2020-12-06 MED ORDER — ASPIRIN EC 81 MG PO TBEC
81.0000 mg | DELAYED_RELEASE_TABLET | Freq: Every day | ORAL | Status: DC
  Administered 2020-12-07 – 2020-12-08 (×2): 81 mg via ORAL
  Filled 2020-12-06 (×2): qty 1

## 2020-12-06 MED ORDER — GABAPENTIN 600 MG PO TABS
600.0000 mg | ORAL_TABLET | Freq: Three times a day (TID) | ORAL | Status: DC
  Administered 2020-12-06 – 2020-12-08 (×5): 600 mg via ORAL
  Filled 2020-12-06 (×5): qty 1

## 2020-12-06 MED ORDER — LACTATED RINGERS IV BOLUS: 1000.0000 mL | Freq: Once | INTRAVENOUS | Status: DC

## 2020-12-06 MED ORDER — INSULIN ASPART 100 UNIT/ML ~~LOC~~ SOLN
0.0000 [IU] | SUBCUTANEOUS | Status: DC
  Administered 2020-12-07: 2 [IU] via SUBCUTANEOUS
  Administered 2020-12-07: 5 [IU] via SUBCUTANEOUS
  Administered 2020-12-07 – 2020-12-08 (×2): 2 [IU] via SUBCUTANEOUS
  Administered 2020-12-08: 3 [IU] via SUBCUTANEOUS
  Filled 2020-12-06 (×5): qty 1

## 2020-12-06 MED ORDER — SODIUM CHLORIDE 0.9 % IV SOLN
75.0000 mL/h | INTRAVENOUS | Status: AC
  Administered 2020-12-06: 75 mL/h via INTRAVENOUS

## 2020-12-06 MED ORDER — ONDANSETRON HCL 4 MG/2ML IJ SOLN
4.0000 mg | Freq: Once | INTRAMUSCULAR | Status: AC
  Administered 2020-12-06: 4 mg via INTRAVENOUS
  Filled 2020-12-06: qty 2

## 2020-12-06 MED ORDER — ACETAMINOPHEN 650 MG RE SUPP: 650.0000 mg | Freq: Four times a day (QID) | RECTAL | Status: DC | PRN

## 2020-12-06 NOTE — Progress Notes (Signed)
Subjective:    Patient ID: Brian Williams, male    DOB: 11-15-1960, 60 y.o.   MRN: [id number]  HPI  Brian Williams is a very pleasant 60 y.o. male with a significant medical history including uncontrolled type 2 diabetes, CHF, hypertension, unstable angina, CAD, chronic back pain, OSA, tobacco abuse who presents today to discuss multiple issues.  Liquid, watery, brown stools occurring daily, multiple times daily (3-6 times daily). This began about 3 months ago. Also with nausea daily with vomiting 1-2 times daily some days, consistently for two weeks. He denies bloody emesis and blood stools, abdominal pain. He does notice generalized bloating and discomfort. He was referred to GI for evaluation of dysentery, appointment scheduled for April 13th.   He is drinking mostly regular soda "to keep my blood sugars from dropping". Some water, maybe 2 bottles. He is eating 1 light meal daily, muffins, cereal mostly. Glucose readings have been running in the low 100's, some readings in the 80's, last night dropped to 54.   He's concerned about his blood pressure which is running in the 70's-80's/30's-40's with shortness of breath, dizziness. HR running in the 70's. He cannot walk long distances before feeling "washed out" and "sees white". He has notified his cardiologist who was told that BP between 40-981 systolic is normal. It was recommended that he cut his Entreso in half during his morning dose.   He continues to struggle with chronic neck pain, gabapentin increased by neurologist to 600 mg TID, he hasn't noticed any improvement with this regimen.    He underwent a dental procedure 6 weeks ago, all top teeth were pulled out. Amoxicillin prescribed for 10 days, he completed this regimen. Diarrhea and nausea symptoms began prior to his Amoxil use.   He has begun reducing his furosemide to 40 mg BID, and has reduced his Entresto morning dose by half as instructed by cardiology.   BP Readings from Last  3 Encounters:  12/06/20 (!) 80/50  11/21/20 110/62  08/08/20 110/60   Wt Readings from Last 3 Encounters:  12/06/20 268 lb 12 oz (121.9 kg)  11/21/20 279 lb (126.6 kg)  08/08/20 278 lb (126.1 kg)      Review of Systems  Constitutional: Positive for fatigue.  Respiratory: Positive for shortness of breath.   Cardiovascular: Negative for leg swelling.  Gastrointestinal: Positive for diarrhea, nausea and vomiting. Negative for abdominal pain and blood in stool.  Neurological: Positive for dizziness and numbness. Negative for headaches.         Past Medical History:  Diagnosis Date  . AICD (automatic cardioverter/defibrillator) present   . Anxiety    takes Xanax as needed  . Arthritis    back,knees,right shoulder  . Back pain   . Cardiomyopathy ([place])   . CHF (congestive heart failure) (HCC)    takes Lasix and Aldactone daily  . Coronary artery disease    takes Plavix daily  . Depression    takes Zoloft daily  . Diabetes mellitus without complication (HCC)    Humulin R and Farxiga daily.Fasting blood sugar runs 140  . GERD (gastroesophageal reflux disease)   . Headache   . History of bronchitis   . History of colon polyps    benign  . History of hiatal hernia   . Hyperlipidemia    takes Fenofibrate,Crestor, and Zetia daily  . Hypertension    takes Entresto and Coreg daily  . MI (myocardial infarction) ([place]) 2001  . Obesity   .  Peripheral neuropathy   . Pneumonia    history of-last time about 14 yrs ago  . PONV (postoperative nausea and vomiting)    after knee surgery 25 yrs ago b/p stayed elevated for a while  . Presence of permanent cardiac pacemaker   . Shortness of breath dyspnea    with exertion  . Sleep apnea    uses CPAP nightly  . Ventricular tachycardia ([place])    s/p RFCA PVCs 2013    Social History   Socioeconomic History  . Marital status: Married    Spouse name: Not on file  . Number of children: Not on file  . Years of education: Not on  file  . Highest education level: Not on file  Occupational History  . Not on file  Tobacco Use  . Smoking status: Former Smoker    Packs/day: 1.00    Years: 25.00    Pack years: 25.00    Types: Cigarettes    Quit date: 05/19/2018    Years since quitting: 2.5  . Smokeless tobacco: Never Used  Vaping Use  . Vaping Use: Never used  Substance and Sexual Activity  . Alcohol use: No    Comment: rare  . Drug use: No  . Sexual activity: Not Currently  Other Topics Concern  . Not on file  Social History Narrative   Married.   Moved from [place].   Disabled.   Social Determinants of Health   Financial Resource Strain: Not on file  Food Insecurity: Not on file  Transportation Needs: Not on file  Physical Activity: Not on file  Stress: Not on file  Social Connections: Not on file  Intimate Partner Violence: Not on file    Past Surgical History:  Procedure Laterality Date  . BACK SURGERY    . CARDIAC CATHETERIZATION    . CARDIAC CATHETERIZATION Left 05/10/2016   Procedure: Left Heart Cath and Coronary Angiography;  Surgeon: Minna Merritts, MD;  Location: [facility];  Service: Cardiovascular;  Laterality: Left;  . CARDIAC DEFIBRILLATOR PLACEMENT  10/16/2007   ICD Model number 2207-36 serial number [id number]  . CARDIAC ELECTROPHYSIOLOGY STUDY AND ABLATION    . CHOLECYSTECTOMY  2001  . COLONOSCOPY    . COLONOSCOPY WITH PROPOFOL N/A 08/28/2018   Procedure: COLONOSCOPY WITH PROPOFOL;  Surgeon: Jonathon Bellows, MD;  Location: [facility];  Service: Gastroenterology;  Laterality: N/A;  . CORONARY ANGIOPLASTY WITH STENT PLACEMENT     7 stents  . CORONARY ARTERY BYPASS GRAFT N/A 05/24/2016   Procedure: CORONARY ARTERY BYPASS GRAFTING (CABG) x four , using left internal mammary artery and left leg greater saphenous vein harvested endoscopically;  Surgeon: Gaye Pollack, MD;  Location: [facility];  Service: Open Heart Surgery;  Laterality: N/A;  . EP IMPLANTABLE DEVICE N/A 06/16/2015    Procedure: ICD Generator Changeout;  Surgeon: Deboraha Sprang, MD;  Location: [facility];  Service: Cardiovascular;  Laterality: N/A;  . ESOPHAGOGASTRODUODENOSCOPY    . INSERT / REPLACE / REMOVE PACEMAKER    . KNEE SURGERY     bilateral knee   . [name] WITHOUT CARDIOVERSION N/A 05/24/2016   Procedure: TRANSESOPHAGEAL ECHOCARDIOGRAM ([name]);  Surgeon: Gaye Pollack, MD;  Location: [facility];  Service: Open Heart Surgery;  Laterality: N/A;  . VASECTOMY      Family History  Problem Relation Age of Onset  . Heart attack Mother 70  . Hypertension Mother   . Heart attack Father 64  . Hypertension Father   .  Hypertension Maternal Uncle   . Heart disease Maternal Uncle   . Heart disease Maternal Grandmother   . Stroke Maternal Grandmother   . Diabetes Neg Hx     Allergies  Allergen Reactions  . Morphine And Related Nausea And Vomiting    Morphine only  . Naproxen Other (See Comments)    Causes hyperactivity    Current Outpatient Medications on File Prior to Visit  Medication Sig Dispense Refill  . aspirin EC 81 MG tablet Take 81 mg by mouth daily.  90 tablet   . carvedilol (COREG) 25 MG tablet TAKE 1 TABLET BY MOUTH  TWICE DAILY WITH A MEAL 180 tablet 3  . clopidogrel (PLAVIX) 75 MG tablet TAKE ONE TABLET BY MOUTH DAILY 90 tablet 1  . escitalopram (LEXAPRO) 20 MG tablet TAKE 1 TABLET BY MOUTH  DAILY FOR ANXIETY AND  DEPRESSION 90 tablet 3  . ezetimibe (ZETIA) 10 MG tablet TAKE 1 TABLET BY MOUTH  DAILY 90 tablet 2  . furosemide (LASIX) 40 MG tablet TAKE TWO TABLETS BY MOUTH EVERY MORNING AND TAKE ONE TABLET BY MOUTH EVERY EVENING. MAY TAKE 1 TABLET IN THE AFTERNOON AS NEEDED 360 tablet 2  . gabapentin (NEURONTIN) 600 MG tablet Take 1 tablet (600 mg total) by mouth 2 (two) times daily. For pain. 180 tablet 0  . gemfibrozil (LOPID) 600 MG tablet TAKE 1 TABLET BY MOUTH  DAILY 90 tablet 3  . hydrOXYzine (ATARAX/VISTARIL) 25 MG tablet Take 1 tablet (25 mg total) by mouth 2 (two) times daily  as needed for anxiety. 180 tablet 0  . ibuprofen (ADVIL,MOTRIN) 200 MG tablet Take 600 mg by mouth every 6 (six) hours as needed.    . insulin regular human CONCENTRATED (HUMULIN R) 500 UNIT/ML injection For use in pump, for a total of 130 units/day (Patient taking differently: For use in pump, for a total of 170 units/day) 20 mL 11  . rosuvastatin (CRESTOR) 20 MG tablet TAKE 1 TABLET BY MOUTH AT  BEDTIME FOR CHOLESTEROL 90 tablet 3  . sacubitril-valsartan (ENTRESTO) 97-103 MG Take 1 tablet by mouth 2 (two) times daily.    . sildenafil (REVATIO) 20 MG tablet Take 2 to 5 tablets by mouth 1 hour prior to intercourse as needed. 50 tablet 0  . spironolactone (ALDACTONE) 50 MG tablet Take 50 mg by mouth daily.    . traZODone (DESYREL) 100 MG tablet TAKE 2 TABLETS BY MOUTH  EVERY NIGHT AT BEDTIME 180 tablet 3  . [DISCONTINUED] DULoxetine (CYMBALTA) 30 MG capsule Take 1 capsule by mouth for 7 days, then 2 capsules thereafter for depression. 60 capsule 1   No current facility-administered medications on file prior to visit.    BP (!) 80/50   Pulse 70   Temp 97.9 F (36.6 C) (Temporal)   Ht 6' (1.829 m)   Wt 268 lb 12 oz (121.9 kg)   SpO2 98%   BMI 36.45 kg/m  Objective:   Physical Exam Cardiovascular:     Rate and Rhythm: Normal rate and regular rhythm.  Pulmonary:     Effort: Pulmonary effort is normal.     Breath sounds: Normal breath sounds. No wheezing or rales.  Abdominal:     General: Bowel sounds are normal.     Palpations: Abdomen is soft.     Tenderness: There is no abdominal tenderness.  Musculoskeletal:     Cervical back: Neck supple.  Skin:    General: Skin is warm and dry.  Neurological:  Mental Status: He is alert and oriented to person, place, and time.           Assessment & Plan:      This visit occurred during the SARS-CoV-2 public health emergency.  Safety protocols were in place, including screening questions prior to the visit, additional usage of  staff PPE, and extensive cleaning of exam room while observing appropriate contact time as indicated for disinfecting solutions.

## 2020-12-06 NOTE — Assessment & Plan Note (Addendum)
Acute for nearly three months, also with nausea and vomiting.   Unclear etiology, could be gastroparesis, however he seems to be experiencing more diarrhea than vomiting. Other differentials include colitis, c-diff.   Significantly hypotensive today, also with home readings. Cardiology aware.  Given duration of symptoms, coupled with significant medical history, hypotension, patient could very well be in renal failure, could also experience syncope he agrees to ED evaluation.   Paramedics called and will transport patient to [facility] ED.  Paramedics never showed up to clinic, report was called to [facility] ED Aldona Bar RN at triage.  Patient's wife will transport him to ED now.

## 2020-12-06 NOTE — ED Notes (Signed)
Patient transported to CT 

## 2020-12-06 NOTE — Patient Instructions (Addendum)
Paramedics are coming to take you to the hospital.  It was a pleasure to see you today!

## 2020-12-06 NOTE — ED Provider Notes (Signed)
[facility] Emergency Department Provider Note  ____________________________________________   Event Date/Time   First MD Initiated Contact with Patient 12/06/20 1851     (approximate)  I have reviewed the triage vital signs and the nursing notes.   HISTORY  Chief Complaint Hypotension and Vomiting    HPI Brian Williams is a 60 y.o. male with history of ischemic cardiomyopathy with a EF of 25%, coronary disease, morbid obesity, here with diarrhea for the last 2 months.  He states that he has had essentially daily loose bowel movements for the last 2 months.  They have been increasingly frequent and severe.  He is also had intermittent vomiting over the last several weeks.  He has had associated generalized fatigue, weakness, and lightheadedness.  He feels like he has no energy.  He said worsening shortness of breath with exertion.  He states that over just the last couple days, he has decreased his Lasix although he been taking his usual dose up until the last few days.  Denies any specific precipitating factors.  No recent medication change.  No recent travel.  No recent antibiotic use.  No overt abdominal pain, only intermittent cramping with diarrhea.        Past Medical History:  Diagnosis Date  . AICD (automatic cardioverter/defibrillator) present   . Anxiety    takes Xanax as needed  . Arthritis    back,knees,right shoulder  . Back pain   . Cardiomyopathy ([place])   . CHF (congestive heart failure) (HCC)    takes Lasix and Aldactone daily  . Coronary artery disease    takes Plavix daily  . Depression    takes Zoloft daily  . Diabetes mellitus without complication (HCC)    Humulin R and Farxiga daily.Fasting blood sugar runs 140  . GERD (gastroesophageal reflux disease)   . Headache   . History of bronchitis   . History of colon polyps    benign  . History of hiatal hernia   . Hyperlipidemia    takes Fenofibrate,Crestor, and Zetia daily  .  Hypertension    takes Entresto and Coreg daily  . MI (myocardial infarction) ([place]) 2001  . Obesity   . Peripheral neuropathy   . Pneumonia    history of-last time about 14 yrs ago  . PONV (postoperative nausea and vomiting)    after knee surgery 25 yrs ago b/p stayed elevated for a while  . Presence of permanent cardiac pacemaker   . Shortness of breath dyspnea    with exertion  . Sleep apnea    uses CPAP nightly  . Ventricular tachycardia ([place])    s/p RFCA PVCs 2013    Patient Active Problem List   Diagnosis Date Noted  . Diarrhea 12/06/2020  . Hypotension 12/06/2020  . Dizziness 11/23/2020  . Neck pain 11/21/2020  . Photophobia 11/21/2020  . Sinusitis, acute 08/08/2020  . Chronic knee pain 07/25/2020  . Penile irritation 10/14/2019  . Myalgia 01/28/2019  . Dry skin 07/08/2018  . Major depressive disorder 07/08/2017  . Erectile dysfunction 07/08/2017  . Chest pain with moderate risk for cardiac etiology 07/26/2016  . Hx of CABG 07/26/2016  . Coronary artery disease 05/24/2016  . Atherosclerosis of native coronary artery of native heart with stable angina pectoris ([place])   . History of coronary artery stent placement   . Unstable angina ([place]) 05/04/2016  . Obesity (BMI 30-39.9) 04/06/2016  . Memory disturbance 01/06/2016  . OSA on CPAP 01/06/2016  .  Diabetes, polyneuropathy ([place]) 01/06/2016  . Insomnia 01/06/2016  . Hidradenitis suppurativa 10/06/2015  . Preventative health care 10/06/2015  . Chronic back pain 05/02/2015  . Essential hypertension 05/02/2015  . Hyperlipidemia 05/02/2015  . Generalized anxiety disorder 05/02/2015  . ICD (implantable cardioverter-defibrillator) in place 04/20/2015  . Congestive dilated cardiomyopathy ([place]) 04/20/2015  . Diabetes mellitus type 2, uncontrolled, with complications ([place]) 07/37/1062  . Chronic systolic heart failure ([place]) 09/30/2014    Past Surgical History:  Procedure Laterality Date  . BACK SURGERY    . CARDIAC  CATHETERIZATION    . CARDIAC CATHETERIZATION Left 05/10/2016   Procedure: Left Heart Cath and Coronary Angiography;  Surgeon: Minna Merritts, MD;  Location: [place];  Service: Cardiovascular;  Laterality: Left;  . CARDIAC DEFIBRILLATOR PLACEMENT  10/16/2007   ICD Model number 2207-36 serial number [id number]  . CARDIAC ELECTROPHYSIOLOGY STUDY AND ABLATION    . CHOLECYSTECTOMY  2001  . COLONOSCOPY    . COLONOSCOPY WITH PROPOFOL N/A 08/28/2018   Procedure: COLONOSCOPY WITH PROPOFOL;  Surgeon: Jonathon Bellows, MD;  Location: [facility] END[facility];  Service: Gastroenterology;  Laterality: N/A;  . CORONARY ANGIOPLASTY WITH STENT PLACEMENT     7 stents  . CORONARY ARTERY BYPASS GRAFT N/A 05/24/2016   Procedure: CORONARY ARTERY BYPASS GRAFTING (CABG) x four , using left internal mammary artery and left leg greater saphenous vein harvested endoscopically;  Surgeon: Gaye Pollack, MD;  [facility]: [facility];  Service: Open Heart Surgery;  Laterality: N/A;  . EP IMPLANTABLE DEVICE N/A 06/16/2015   Procedure: ICD Generator Changeout;  Surgeon: Deboraha Sprang, MD;  Location: [place] [facility];  Service: Cardiovascular;  Laterality: N/A;  . ESOPHAGOGASTRODUODENOSCOPY    . INSERT / REPLACE / REMOVE PACEMAKER    . KNEE SURGERY     bilateral knee   . [name] WITHOUT CARDIOVERSION N/A 05/24/2016   Procedure: TRANSESOPHAGEAL ECHOCARDIOGRAM ([name]);  Surgeon: Gaye Pollack, MD;  Location: [facility];  Service: Open Heart Surgery;  Laterality: N/A;  . VASECTOMY      Prior to Admission medications   Medication Sig Start Date End Date Taking? Authorizing Provider  aspirin EC 81 MG tablet Take 81 mg by mouth daily.  06/21/16   Minna Merritts, MD  carvedilol (COREG) 25 MG tablet TAKE 1 TABLET BY MOUTH  TWICE DAILY WITH A MEAL 05/10/20   Minna Merritts, MD  clopidogrel (PLAVIX) 75 MG tablet TAKE ONE TABLET BY MOUTH DAILY 12/04/18   Minna Merritts, MD  escitalopram (LEXAPRO) 20 MG tablet TAKE 1 TABLET BY MOUTH  DAILY FOR  ANXIETY AND  DEPRESSION 08/13/20   Pleas Koch, NP  ezetimibe (ZETIA) 10 MG tablet TAKE 1 TABLET BY MOUTH  DAILY 09/01/20   Minna Merritts, MD  furosemide (LASIX) 40 MG tablet TAKE TWO TABLETS BY MOUTH EVERY MORNING AND TAKE ONE TABLET BY MOUTH EVERY EVENING. MAY TAKE 1 TABLET IN THE AFTERNOON AS NEEDED 12/28/19   Minna Merritts, MD  gabapentin (NEURONTIN) 600 MG tablet Take 1 tablet (600 mg total) by mouth 2 (two) times daily. For pain. 10/07/20   Pleas Koch, NP  gemfibrozil (LOPID) 600 MG tablet TAKE 1 TABLET BY MOUTH  DAILY 06/17/20   Pleas Koch, NP  hydrOXYzine (ATARAX/VISTARIL) 25 MG tablet Take 1 tablet (25 mg total) by mouth 2 (two) times daily as needed for anxiety. 10/26/19   Pleas Koch, NP  ibuprofen (ADVIL,MOTRIN) 200 MG tablet Take 600 mg by mouth  every 6 (six) hours as needed.    [provider]  insulin regular human CONCENTRATED (HUMULIN R) 500 UNIT/ML injection For use in pump, for a total of 130 units/day Patient taking differently: For use in pump, for a total of 170 units/day 10/31/17   Renato Shin, MD  rosuvastatin (CRESTOR) 20 MG tablet TAKE 1 TABLET BY MOUTH AT  BEDTIME FOR CHOLESTEROL 06/17/20   Pleas Koch, NP  sacubitril-valsartan (ENTRESTO) 97-103 MG Take 1 tablet by mouth 2 (two) times daily.    [provider]  sildenafil (REVATIO) 20 MG tablet Take 2 to 5 tablets by mouth 1 hour prior to intercourse as needed. 11/15/20   Pleas Koch, NP  spironolactone (ALDACTONE) 50 MG tablet Take 50 mg by mouth daily.    [provider]  traZODone (DESYREL) 100 MG tablet TAKE 2 TABLETS BY MOUTH  EVERY NIGHT AT BEDTIME 06/17/20   Pleas Koch, NP  DULoxetine (CYMBALTA) 30 MG capsule Take 1 capsule by mouth for 7 days, then 2 capsules thereafter for depression. 07/24/19 09/30/19  Pleas Koch, NP    Allergies Morphine and related and Naproxen  Family History  Problem Relation Age of Onset  . Heart attack  Mother 50  . Hypertension Mother   . Heart attack Father 53  . Hypertension Father   . Hypertension Maternal Uncle   . Heart disease Maternal Uncle   . Heart disease Maternal Grandmother   . Stroke Maternal Grandmother   . Diabetes Neg Hx     Social History Social History   Tobacco Use  . Smoking status: Former Smoker    Packs/day: 1.00    Years: 25.00    Pack years: 25.00    Types: Cigarettes    Quit date: 05/19/2018    Years since quitting: 2.5  . Smokeless tobacco: Never Used  Vaping Use  . Vaping Use: Never used  Substance Use Topics  . Alcohol use: No    Comment: rare  . Drug use: No    Review of Systems  Review of Systems  Constitutional: Positive for fatigue. Negative for chills and fever.  HENT: Negative for sore throat.   Respiratory: Negative for shortness of breath.   Cardiovascular: Negative for chest pain.  Gastrointestinal: Positive for diarrhea and nausea. Negative for abdominal pain.  Genitourinary: Negative for flank pain.  Musculoskeletal: Negative for neck pain.  Skin: Negative for rash and wound.  Allergic/Immunologic: Negative for immunocompromised state.  Neurological: Positive for weakness. Negative for numbness.  Hematological: Does not bruise/bleed easily.  All other systems reviewed and are negative.    ____________________________________________  PHYSICAL EXAM:      VITAL SIGNS: ED Triage Vitals  Enc Vitals Group     BP 12/06/20 1647 (!) 95/58     Pulse Rate 12/06/20 1647 65     Resp 12/06/20 1647 16     Temp 12/06/20 1647 97.9 F (36.6 C)     Temp Source 12/06/20 1647 Oral     SpO2 12/06/20 1647 95 %     Weight 12/06/20 1648 264 lb (119.7 kg)     Height 12/06/20 1648 6' (1.829 m)     Head Circumference --      Peak Flow --      Pain Score 12/06/20 1648 0     Pain Loc --      Pain Edu? --      Excl. in [place]? --      Physical Exam Vitals  and nursing note reviewed.  Constitutional:      General: He is not in acute  distress.    Appearance: He is well-developed.  HENT:     Head: Normocephalic and atraumatic.     Mouth/Throat:     Mouth: Mucous membranes are dry.  Eyes:     Conjunctiva/sclera: Conjunctivae normal.  Cardiovascular:     Rate and Rhythm: Normal rate and regular rhythm.     Heart sounds: Normal heart sounds.  Pulmonary:     Effort: Pulmonary effort is normal. No respiratory distress.     Breath sounds: No wheezing.  Abdominal:     General: Abdomen is flat. There is no distension.     Tenderness: There is no abdominal tenderness.     Comments: Hyperactive bowel sounds  Musculoskeletal:     Cervical back: Neck supple.  Skin:    General: Skin is warm.     Capillary Refill: Capillary refill takes less than 2 seconds.     Findings: No rash.  Neurological:     Mental Status: He is alert and oriented to person, place, and time.     Motor: No abnormal muscle tone.       ____________________________________________   LABS (all labs ordered are listed, but only abnormal results are displayed)  Labs Reviewed  COMPREHENSIVE METABOLIC PANEL - Abnormal; Notable for the following components:      Result Value   Sodium 132 (*)    CO2 14 (*)    BUN 108 (*)    Creatinine, Ser 2.83 (*)    Alkaline Phosphatase 35 (*)    GFR, Estimated 25 (*)    All other components within normal limits  CBC - Abnormal; Notable for the following components:   RBC 2.97 (*)    Hemoglobin 9.7 (*)    HCT 27.4 (*)    Platelets 142 (*)    All other components within normal limits  MAGNESIUM - Abnormal; Notable for the following components:   Magnesium 2.5 (*)    All other components within normal limits  CULTURE, BLOOD (SINGLE)  GASTROINTESTINAL PANEL BY PCR, STOOL (REPLACES STOOL CULTURE)  C DIFFICILE QUICK SCREEN W PCR REFLEX  RESP PANEL BY RT-PCR (FLU A&B, COVID) ARPGX2  LIPASE, BLOOD  LACTIC ACID, PLASMA  URINALYSIS, COMPLETE (UACMP) WITH MICROSCOPIC  LACTIC ACID, PLASMA     ____________________________________________  EKG: Normal sinus rhythm, VR 63. QRS 130, QTc 397. No acute ST elevations or depressions. ________________________________________  RADIOLOGY All imaging, including plain films, CT scans, and ultrasounds, independently reviewed by me, and interpretations confirmed via formal radiology reads.  ED MD interpretation:   CT A/P: Pending  Official radiology report(s): No results found.  ____________________________________________  PROCEDURES   Procedure(s) performed (including Critical Care):  Procedures  ____________________________________________  INITIAL IMPRESSION / MDM / [place] / ED COURSE  As part of my medical decision making, I reviewed the following data within the [place] notes reviewed and incorporated, Old chart reviewed, Notes from prior ED visits, and  Controlled Substance Database       *Brian Williams was evaluated in Emergency Department on 12/06/2020 for the symptoms described in the history of present illness. He was evaluated in the context of the global COVID-19 pandemic, which necessitated consideration that the patient might be at risk for infection with the SARS-CoV-2 virus that causes COVID-19. Institutional protocols and algorithms that pertain to the evaluation of patients at risk for COVID-19 are in  a state of rapid change based on information released by regulatory bodies including the CDC and federal and state organizations. These policies and algorithms were followed during the patient's care in the ED.  Some ED evaluations and interventions may be delayed as a result of limited staffing during the pandemic.*     Medical Decision Making: 60 year old male with history of ischemic cardiomyopathy here with generalized weakness and diarrhea for 2 months.  He appears significantly dehydrated clinically and lab work shows marked AKI with elevated BUN, creatinine, and  bicarb of 14.  Anion gap is 14.  Lactic acid normal, her his abdomen is soft, and he has no leukocytosis, fever, or evidence to suggest diverticulitis or significant abdominal infection.  Differential is broad, includes diarrhea secondary to his diabetes, bacterial overgrowth, infectious or noninfectious colitis, IBD/IBS.  Will start very gentle fluids in the setting of his hypotension, which I suspect is due to hypovolemia, and admit.  Stool studies sent.  CT Noncon pending.  ____________________________________________  FINAL CLINICAL IMPRESSION(S) / ED DIAGNOSES  Final diagnoses:  Dehydration  AKI (acute kidney injury) ([place])  Diarrhea of presumed infectious origin     MEDICATIONS GIVEN DURING THIS VISIT:  Medications  ondansetron (ZOFRAN) injection 4 mg (4 mg Intravenous Given 12/06/20 1919)  sodium chloride 0.9 % bolus 500 mL (0 mLs Intravenous Stopped 12/06/20 1954)     ED Discharge Orders    None       Note:  This document was prepared using Dragon voice recognition software and may include unintentional dictation errors.   Duffy Bruce, MD 12/06/20 2012

## 2020-12-06 NOTE — ED Notes (Signed)
On assumption of care this RN noticed pt's blood glucose has not been checked recently and pt hypotensive. This RN checked patient's and blood glucose is 50. [place] MD paged to make aware, pt provided with orange juice, pt alert at this time, pt's insulin pump disconnected and pt advised to leave insulin pump off and disconnected.

## 2020-12-06 NOTE — ED Triage Notes (Signed)
Pt arrives with wife from PCP for hypotension, N&V&D. States symptoms x "couple month" A&O, ambulatory. No distress noted.

## 2020-12-06 NOTE — H&P (Signed)
Brian Williams VHQ:[id number] DOB: 06-Aug-1961 DOA: 12/06/2020     PCP: Pleas Koch, NP   Outpatient Specialists:   CARDS:   Dr. Rockey Situ   GI Dr. Vicente Males    Patient arrived to ER on 12/06/20 at 1642 Referred by Attending Duffy Bruce, MD   Patient coming from: home Lives With family    Chief Complaint:  Chief Complaint  Patient presents with  . Hypotension  . Vomiting    HPI: Brian Williams is a 60 y.o. male with medical history significant of  ischemic cardiomyopathy with a EF of 25%, coronary disease, morbid obesity, status post AICD placement,    Presented with   diarrhea for the past 2 months getting worse occasional vomiting as well worse for past 1 week.  Has had significant fatigue and weakness and lightheadedness when tries to stand up.  He has no energy.  Also started get worsening shortness of breath with exertion.  He did cut down on his Lasix for the past few days Denies any antibiotic use.  No abdominal pain but sometimes gets cramping or diarrhea.  No fevers no chills no chest pain Reports the diarrhea occurs as soon as he eats. He has been feeling some palpitations but no chest pain Notes his blood sugar has been running somewhat lower now That he has had decreased p.o. intake Infectious risk factors:  Reports N/V/Diarrhea   severe fatigue     Has been vaccinated against COVID  and boosted   Initial COVID TEST   in house  PCR testing  Pending  No results found for: SARSCOV2NAA   Regarding pertinent Chronic problems:     Hyperlipidemia - on statins Crestor Zetia fenofibrate Lipid Panel     Component Value Date/Time   CHOL 125 07/22/2020 0859   TRIG 139.0 07/22/2020 0859   HDL 32.10 (L) 07/22/2020 0859   CHOLHDL 4 07/22/2020 0859   VLDL 27.8 07/22/2020 0859   LDLCALC 65 07/22/2020 0859   LDLDIRECT 42.0 07/21/2018 1041     HTN on Coreg  chronic CHF  Systolic - last echo 4132 On Lasix and Aldactone Delene Loll, Farxiga    CAD  - On  Aspirin, statin, betablocker, Plavix                 -  followed by cardiology     DM 2 -  Lab Results  Component Value Date   HGBA1C 8.6 (H) 07/22/2020   on insulin, Farxiga     obesity-   BMI Readings from Last 1 Encounters:  12/06/20 35.80 kg/m        OSA -on nocturnal  CPAP,      Chronic anemia - baseline hg Hemoglobin & Hematocrit  Recent Labs    07/13/20 2158 12/06/20 1702  HGB 11.5* 9.7*     While in ER: Initially hypotensive appears to be dehydrated sodium down to 132 noted to have AKI with creatinine up to 2.83 from baseline of 0.9. Also appears to be anemic with hemoglobin down to 9.7 bicarb of 14.  Anion gap is 14.    ED Triage Vitals  Enc Vitals Group     BP 12/06/20 1647 (!) 95/58     Pulse Rate 12/06/20 1647 65     Resp 12/06/20 1647 16     Temp 12/06/20 1647 97.9 F (36.6 C)     Temp Source 12/06/20 1647 Oral     SpO2 12/06/20 1647 95 %  Weight 12/06/20 1648 264 lb (119.7 kg)     Height 12/06/20 1648 6' (1.829 m)     Head Circumference --      Peak Flow --      Pain Score 12/06/20 1648 0     Pain Loc --      Pain Edu? --      Excl. in [place]? --   TMAX(24)@     _________________________________________ Significant initial  Findings: Abnormal Labs Reviewed  COMPREHENSIVE METABOLIC PANEL - Abnormal; Notable for the following components:      Result Value   Sodium 132 (*)    CO2 14 (*)    BUN 108 (*)    Creatinine, Ser 2.83 (*)    Alkaline Phosphatase 35 (*)    GFR, Estimated 25 (*)    All other components within normal limits  CBC - Abnormal; Notable for the following components:   RBC 2.97 (*)    Hemoglobin 9.7 (*)    HCT 27.4 (*)    Platelets 142 (*)    All other components within normal limits  MAGNESIUM - Abnormal; Notable for the following components:   Magnesium 2.5 (*)    All other components within normal limits   ____________________________________________ Ordered    CTabd/pelvis -Fluid and air-filled colon keeping  with history of diarrhea. No significant dilatation or inflammatory changes   ________________   ECG: Ordered Personally reviewed by me showing: HR : 63 Rhythm: NSR,     , no evidence of ischemic changes QTC 397 ____________________   The recent clinical data is shown below. Vitals:   12/06/20 1837 12/06/20 1900 12/06/20 1915 12/06/20 1930  BP: (!) 81/40 (!) 90/36 109/63 114/67  Pulse: 71 (!) 101 66 70  Resp: 17 13 15 17   Temp:      TempSrc:      SpO2: 100% 100% 100% 99%  Weight:      Height:        WBC     Component Value Date/Time   WBC 9.0 12/06/2020 1702     Lactic Acid, Venous    Component Value Date/Time   LATICACIDVEN 1.0 12/06/2020 1907      UA  not ordered    Results for orders placed or performed during the hospital encounter of 05/22/16  Surgical pcr screen     Status: None   Collection Time: 05/22/16  3:19 PM   Specimen: Nasal Mucosa; Nasal Swab  Result Value Ref Range Status   MRSA, PCR NEGATIVE NEGATIVE Final   Staphylococcus aureus NEGATIVE NEGATIVE Final    Comment:        The Xpert SA Assay (FDA approved for NASAL specimens in patients over 20 years of age), is one component of a comprehensive surveillance program.  Test performance has been validated by [facility] for patients greater than or equal to 7 year old. It is not intended to diagnose infection nor to guide or monitor treatment.     ______________ _______________________________________________ Hospitalist was called for admission for AKI and dehydration  The following Work up has been ordered so far:  Orders Placed This Encounter  Procedures  . Blood culture (single)  . Gastrointestinal Panel by PCR , Stool  . C Difficile Quick Screen w PCR reflex  . Resp Panel by RT-PCR (Flu A&B, Covid) Nasopharyngeal Swab  . CT ABDOMEN PELVIS WO CONTRAST  . DG Chest Portable 1 View  . Lipase, blood  . Comprehensive metabolic panel  . CBC  .  Urinalysis, Complete w Microscopic   . Lactic acid, plasma  . Magnesium  . Diet NPO time specified  . Consult to hospitalist  . Airborne and Contact precautions  . ED EKG  . EKG 12-Lead      Following Medications were ordered in ER: Medications  ondansetron (ZOFRAN) injection 4 mg (4 mg Intravenous Given 12/06/20 1919)  sodium chloride 0.9 % bolus 500 mL (0 mLs Intravenous Stopped 12/06/20 1954)        Consult Orders  (From admission, onward)         Start     Ordered   12/06/20 2002  Consult to hospitalist  Once       Provider:  (Not yet assigned)  Question Answer Comment  Place call to: Hospitalist   Reason for Consult Admit      12/06/20 2001            OTHER Significant initial  Findings:  labs showing:    Recent Labs  Lab 12/06/20 1702 12/06/20 1907  NA 132*  --   K 4.6  --   CO2 14*  --   GLUCOSE 75  --   BUN 108*  --   CREATININE 2.83*  --   CALCIUM 9.6  --   MG  --  2.5*    Cr  Up from baseline see below Lab Results  Component Value Date   CREATININE 2.83 (H) 12/06/2020   CREATININE 1.01 07/22/2020   CREATININE 0.92 07/13/2020    Recent Labs  Lab 12/06/20 1702  AST 19  ALT 28  ALKPHOS 35*  BILITOT 0.7  PROT 7.6  ALBUMIN 4.7   Lab Results  Component Value Date   CALCIUM 9.6 12/06/2020          Plt: Lab Results  Component Value Date   PLT 142 (L) 12/06/2020         Recent Labs  Lab 12/06/20 1702  WBC 9.0  HGB 9.7*  HCT 27.4*  MCV 92.3  PLT 142*    HG/HCT   baseline see below    Component Value Date/Time   HGB 9.7 (L) 12/06/2020 1702   HGB 12.1 (L) 05/04/2016 1200   HCT 27.4 (L) 12/06/2020 1702   HCT 36.5 (L) 05/04/2016 1200   MCV 92.3 12/06/2020 1702   MCV 94 05/04/2016 1200     Recent Labs  Lab 12/06/20 1702  LIPASE 35   No results for input(s): AMMONIA in the last 168 hours.     DM  labs:  HbA1C: Recent Labs    07/22/20 0859  HGBA1C 8.6*            Cultures: No results found for: SDES, [place], CULT, REPTSTATUS    Radiological Exams on Admission: No results found. _______________________________________________________________________________________________________ Latest  Blood pressure 114/67, pulse 70, temperature 97.9 F (36.6 C), temperature source Oral, resp. rate 17, height 6' (1.829 m), weight 119.7 kg, SpO2 99 %.   Review of Systems:    Pertinent positives include: fatigue,   abdominal pain dyspnea   diarrhea,  Constitutional:  No weight loss, night sweats, Fevers, chills, weight loss  HEENT:  No headaches, Difficulty swallowing,Tooth/dental problems,Sore throat,  No sneezing, itching, ear ache, nasal congestion, post nasal drip,  Cardio-vascular:  No chest pain, Orthopnea, PND, anasarca, dizziness, palpitations.no Bilateral lower extremity swelling  GI:  No heartburn, indigestion,,change in bowel habits, loss of appetite, melena, blood in stool, hematemesis Resp:  no shortness of breath at rest. No exertion, No  excess mucus, no productive cough, No non-productive cough, No coughing up of blood.No change in color of mucus.No wheezing. Skin:  no rash or lesions. No jaundice GU:  no dysuria, change in color of urine, no urgency or frequency. No straining to urinate.  No flank pain.  Musculoskeletal:  No joint pain or no joint swelling. No decreased range of motion. No back pain.  Psych:  No change in mood or affect. No depression or anxiety. No memory loss.  Neuro: no localizing neurological complaints, no tingling, no weakness, no double vision, no gait abnormality, no slurred speech, no confusion  All systems reviewed and apart from [place] all are negative _______________________________________________________________________________________________ Past Medical History:   Past Medical History:  Diagnosis Date  . AICD (automatic cardioverter/defibrillator) present   . Anxiety    takes Xanax as needed  . Arthritis    back,knees,right shoulder  . Back pain   .  Cardiomyopathy ([employer])   . CHF (congestive heart failure) (HCC)    takes Lasix and Aldactone daily  . Coronary artery disease    takes Plavix daily  . Depression    takes Zoloft daily  . Diabetes mellitus without complication (HCC)    Humulin R and Farxiga daily.Fasting blood sugar runs 140  . GERD (gastroesophageal reflux disease)   . Headache   . History of bronchitis   . History of colon polyps    benign  . History of hiatal hernia   . Hyperlipidemia    takes Fenofibrate,Crestor, and Zetia daily  . Hypertension    takes Entresto and Coreg daily  . MI (myocardial infarction) ([place]) 2001  . Obesity   . Peripheral neuropathy   . Pneumonia    history of-last time about 14 yrs ago  . PONV (postoperative nausea and vomiting)    after knee surgery 25 yrs ago b/p stayed elevated for a while  . Presence of permanent cardiac pacemaker   . Shortness of breath dyspnea    with exertion  . Sleep apnea    uses CPAP nightly  . Ventricular tachycardia ([place])    s/p RFCA PVCs 2013      Past Surgical History:  Procedure Laterality Date  . BACK SURGERY    . CARDIAC CATHETERIZATION    . CARDIAC CATHETERIZATION Left 05/10/2016   Procedure: Left Heart Cath and Coronary Angiography;  Surgeon: Minna Merritts, MD;  Location: [facility];  Service: Cardiovascular;  Laterality: Left;  . CARDIAC DEFIBRILLATOR PLACEMENT  10/16/2007   ICD Model number 2207-36 serial number [id number]  . CARDIAC ELECTROPHYSIOLOGY STUDY AND ABLATION    . CHOLECYSTECTOMY  2001  . COLONOSCOPY    . COLONOSCOPY WITH PROPOFOL N/A 08/28/2018   Procedure: COLONOSCOPY WITH PROPOFOL;  Surgeon: Jonathon Bellows, MD;  Location: [facility];  Service: Gastroenterology;  Laterality: N/A;  . CORONARY ANGIOPLASTY WITH STENT PLACEMENT     7 stents  . CORONARY ARTERY BYPASS GRAFT N/A 05/24/2016   Procedure: CORONARY ARTERY BYPASS GRAFTING (CABG) x four , using left internal mammary artery and left leg greater saphenous vein  harvested endoscopically;  Surgeon: Gaye Pollack, MD;  Location: [facility];  Service: Open Heart Surgery;  Laterality: N/A;  . EP IMPLANTABLE DEVICE N/A 06/16/2015   Procedure: ICD Generator Changeout;  Surgeon: Deboraha Sprang, MD;  Location: [facility];  Service: Cardiovascular;  Laterality: N/A;  . ESOPHAGOGASTRODUODENOSCOPY    . INSERT / REPLACE / [place]  bilateral knee   . [name] WITHOUT CARDIOVERSION N/A 05/24/2016   Procedure: TRANSESOPHAGEAL ECHOCARDIOGRAM ([name]);  Surgeon: Gaye Pollack, MD;  Location: [facility];  Service: Open Heart Surgery;  Laterality: N/A;  . VASECTOMY      Social History:  Ambulatory  independently      reports that he quit smoking about 2 years ago. His smoking use included cigarettes. He has a 25.00 pack-year smoking history. He has never used smokeless tobacco. He reports that he does not drink alcohol and does not use drugs.     Family History:   Family History  Problem Relation Age of Onset  . Heart attack Mother 26  . Hypertension Mother   . Heart attack Father 46  . Hypertension Father   . Hypertension Maternal Uncle   . Heart disease Maternal Uncle   . Heart disease Maternal Grandmother   . Stroke Maternal Grandmother   . Diabetes Neg Hx    ______________________________________________________________________________________________ Allergies: Allergies  Allergen Reactions  . Morphine And Related Nausea And Vomiting    Morphine only  . Naproxen Other (See Comments)    Causes hyperactivity     Prior to Admission medications   Medication Sig Start Date End Date Taking? Authorizing Provider  aspirin EC 81 MG tablet Take 81 mg by mouth daily.  06/21/16  Yes Gollan, Kathlene November, MD  carvedilol (COREG) 25 MG tablet TAKE 1 TABLET BY MOUTH  TWICE DAILY WITH A MEAL 05/10/20  Yes Gollan, Kathlene November, MD  clopidogrel (PLAVIX) 75 MG tablet TAKE ONE TABLET BY MOUTH DAILY 12/04/18  Yes Minna Merritts, MD  escitalopram  (LEXAPRO) 20 MG tablet TAKE 1 TABLET BY MOUTH  DAILY FOR ANXIETY AND  DEPRESSION 08/13/20  Yes Pleas Koch, NP  ezetimibe (ZETIA) 10 MG tablet TAKE 1 TABLET BY MOUTH  DAILY 09/01/20  Yes Gollan, Kathlene November, MD  furosemide (LASIX) 40 MG tablet TAKE TWO TABLETS BY MOUTH EVERY MORNING AND TAKE ONE TABLET BY MOUTH EVERY EVENING. MAY TAKE 1 TABLET IN THE AFTERNOON AS NEEDED Patient taking differently: Take 20 mg by mouth 2 (two) times daily. 12/28/19  Yes Gollan, Kathlene November, MD  gabapentin (NEURONTIN) 600 MG tablet Take 1 tablet (600 mg total) by mouth 2 (two) times daily. For pain. Patient taking differently: Take 600 mg by mouth 3 (three) times daily. For pain. 10/07/20  Yes Pleas Koch, NP  gemfibrozil (LOPID) 600 MG tablet TAKE 1 TABLET BY MOUTH  DAILY 06/17/20  Yes Pleas Koch, NP  ibuprofen (ADVIL,MOTRIN) 200 MG tablet Take 600 mg by mouth every 6 (six) hours as needed.   Yes [provider]  insulin regular human CONCENTRATED (HUMULIN R) 500 UNIT/ML injection For use in pump, for a total of 130 units/day Patient taking differently: For use in pump, for a total of 110 units/day 10/31/17  Yes Renato Shin, MD  rosuvastatin (CRESTOR) 20 MG tablet TAKE 1 TABLET BY MOUTH AT  BEDTIME FOR CHOLESTEROL 06/17/20  Yes Pleas Koch, NP  sacubitril-valsartan (ENTRESTO) 97-103 MG Take 1 tablet by mouth 2 (two) times daily.   Yes [provider]  sildenafil (REVATIO) 20 MG tablet Take 2 to 5 tablets by mouth 1 hour prior to intercourse as needed. 11/15/20  Yes Pleas Koch, NP  spironolactone (ALDACTONE) 50 MG tablet Take 50 mg by mouth daily.   Yes [provider]  traZODone (DESYREL) 100 MG tablet TAKE 2 TABLETS BY MOUTH  EVERY NIGHT AT BEDTIME 06/17/20  Yes Pleas Koch, NP  DULoxetine (CYMBALTA) 30 MG capsule Take 1 capsule by mouth for 7 days, then 2 capsules thereafter for depression. 07/24/19 09/30/19  Pleas Koch, NP     ___________________________________________________________________________________________________ Physical Exam: Vitals with BMI 12/06/2020 12/06/2020 12/06/2020  Height - - -  Weight - - -  BMI - - -  Systolic 683 419 90  Diastolic 67 63 36  Pulse 70 66 101     1. General:  in No Acute distress    Chronically ill  -appearing 2. Psychological: Alert and  Oriented 3. Head/ENT:    Dry Mucous Membranes                          Head Non traumatic, neck supple                       Poor Dentition 4. SKIN:   decreased Skin turgor,  Skin clean Dry and intact no rash 5. Heart: Regular rate and rhythm no  Murmur, no Rub or gallop 6. Lungs: no wheezes or crackles   7. Abdomen: Soft,  non-tender, Non distended  obese  bowel sounds present 8. Lower extremities: no clubbing, cyanosis, no   edema 9. Neurologically Grossly intact, moving all 4 extremities equally   10. MSK: Normal range of motion    Chart has been reviewed  ______________________________________________________________________________________________  Assessment/Plan 60 y.o. male with medical history significant of  ischemic cardiomyopathy with a EF of 25%, coronary disease, morbid obesity, status post AICD placement,    Admitted for  AKI and dehydration due to chronic diarrhea  Present on Admission: . Diarrhea - obtain stool studies, clear diet GI consult will need to assess if any of her medications may be contributing to diarrhea versus bacterial overgrowth.  No evidence of colitis on CT  . Dehydration -resulting in AKI will DC rehydrate gently hold diuretic  . ICD (implantable cardioverter-defibrillator) in place -chronic stable  . Hypotension secondary to hypovolemia improved with fluid resuscitation  . Hyperlipidemia -resume home medications when stable  . Essential hypertension -hold home medications given hypotension allow permissive hypertension for today  . Diabetes mellitus type 2, uncontrolled,  with complications (HCC) -insulin pump in place.  Order sliding scale for correction.  Monitor blood sugars if any evidence of hypoglycemia will DC insulin pump, diabetes coordinator consult  . Coronary artery disease -chronic stable continue Plavix hold blood pressure medications  . Chronic systolic heart failure (HCC) -- currently appears to be  on the dry side, hold home diuretics for tonight and restart when appears euvolemic, carefuly follow fluid status and Cr  AKI ordered urine electrolytes hold Entresto for now  OSA -continue CPAP  Anemia check anemia panel Hemoccult stool Other plan as per orders.  DVT prophylaxis:  SCD      Code Status:    Code Status: Prior FULL CODE  as per patient   I had personally discussed CODE STATUS with patient     Family Communication:   Family not at  Bedside    Disposition Plan:       To home once workup is complete and patient is stable   Following barriers for discharge:                            Electrolytes corrected  Anemia stable                                                                                    Will need to be able to tolerate PO                                  Will need consultants to evaluate patient prior to discharge                       Would benefit from PT/OT eval prior to DC  Ordered                                       Diabetes care coordinator                                      Nutrition    consulted           Consults called: sent msg to GI   Admission status:  ED Disposition    ED Disposition Condition [place]: [place] [[id number]]  Level of Care: Med-Surg [16]  Covid Evaluation: Asymptomatic Screening Protocol (No Symptoms)  Diagnosis: Dehydration [276.51.ICD-9-CM]  Admitting Physician: Toy Baker [[id number]]  Attending Physician: Toy Baker [[id number]]       Obs    Level of care     tele  For 24H        Lab Results  Component Value Date   [place] 12/06/2020     Precautions: admitted as   Covid Negative    PPE: Used by the provider:   N95  eye Goggles,  Gloves   Toy Baker 12/06/2020, 9:52 PM    [facility]     after 2 AM please page floor coverage PA If 7AM-7PM, please contact the day team taking care of the patient using [email or web address]   Patient was evaluated in the context of the global COVID-19 pandemic, which necessitated consideration that the patient might be at risk for infection with the SARS-CoV-2 virus that causes COVID-19. Institutional protocols and algorithms that pertain to the evaluation of patients at risk for COVID-19 are in a state of rapid change based on information released by regulatory bodies including the CDC and federal and state organizations. These policies and algorithms were followed during the patient's care.

## 2020-12-06 NOTE — Assessment & Plan Note (Signed)
Acute for the last several weeks, likely secondary to dehydration, also on several antihypertensives.   Given significant medical history coupled with duration of symptoms and vitals today, we both agree that ED evaluation is best.   Paramedics called and will take patient to [facility].

## 2020-12-07 DIAGNOSIS — I420 Dilated cardiomyopathy: Secondary | ICD-10-CM | POA: Diagnosis not present

## 2020-12-07 DIAGNOSIS — R112 Nausea with vomiting, unspecified: Secondary | ICD-10-CM | POA: Diagnosis not present

## 2020-12-07 DIAGNOSIS — R198 Other specified symptoms and signs involving the digestive system and abdomen: Secondary | ICD-10-CM | POA: Diagnosis not present

## 2020-12-07 DIAGNOSIS — I2510 Atherosclerotic heart disease of native coronary artery without angina pectoris: Secondary | ICD-10-CM | POA: Diagnosis not present

## 2020-12-07 DIAGNOSIS — Z79899 Other long term (current) drug therapy: Secondary | ICD-10-CM | POA: Diagnosis not present

## 2020-12-07 DIAGNOSIS — I255 Ischemic cardiomyopathy: Secondary | ICD-10-CM | POA: Diagnosis not present

## 2020-12-07 DIAGNOSIS — Z95810 Presence of automatic (implantable) cardiac defibrillator: Secondary | ICD-10-CM | POA: Diagnosis not present

## 2020-12-07 DIAGNOSIS — Z951 Presence of aortocoronary bypass graft: Secondary | ICD-10-CM | POA: Diagnosis not present

## 2020-12-07 DIAGNOSIS — N179 Acute kidney failure, unspecified: Secondary | ICD-10-CM | POA: Diagnosis not present

## 2020-12-07 DIAGNOSIS — F419 Anxiety disorder, unspecified: Secondary | ICD-10-CM | POA: Diagnosis not present

## 2020-12-07 DIAGNOSIS — D638 Anemia in other chronic diseases classified elsewhere: Secondary | ICD-10-CM | POA: Diagnosis not present

## 2020-12-07 DIAGNOSIS — E872 Acidosis: Secondary | ICD-10-CM | POA: Diagnosis not present

## 2020-12-07 DIAGNOSIS — F411 Generalized anxiety disorder: Secondary | ICD-10-CM | POA: Diagnosis present

## 2020-12-07 DIAGNOSIS — Z7902 Long term (current) use of antithrombotics/antiplatelets: Secondary | ICD-10-CM | POA: Diagnosis not present

## 2020-12-07 DIAGNOSIS — K219 Gastro-esophageal reflux disease without esophagitis: Secondary | ICD-10-CM | POA: Diagnosis not present

## 2020-12-07 DIAGNOSIS — Z955 Presence of coronary angioplasty implant and graft: Secondary | ICD-10-CM | POA: Diagnosis not present

## 2020-12-07 DIAGNOSIS — E1165 Type 2 diabetes mellitus with hyperglycemia: Secondary | ICD-10-CM | POA: Diagnosis not present

## 2020-12-07 DIAGNOSIS — E860 Dehydration: Secondary | ICD-10-CM | POA: Diagnosis not present

## 2020-12-07 DIAGNOSIS — Z794 Long term (current) use of insulin: Secondary | ICD-10-CM | POA: Diagnosis not present

## 2020-12-07 DIAGNOSIS — A0811 Acute gastroenteropathy due to Norwalk agent: Secondary | ICD-10-CM | POA: Diagnosis not present

## 2020-12-07 DIAGNOSIS — R197 Diarrhea, unspecified: Secondary | ICD-10-CM | POA: Diagnosis not present

## 2020-12-07 DIAGNOSIS — E1142 Type 2 diabetes mellitus with diabetic polyneuropathy: Secondary | ICD-10-CM | POA: Diagnosis not present

## 2020-12-07 DIAGNOSIS — F32A Depression, unspecified: Secondary | ICD-10-CM | POA: Diagnosis not present

## 2020-12-07 DIAGNOSIS — Z87891 Personal history of nicotine dependence: Secondary | ICD-10-CM | POA: Diagnosis not present

## 2020-12-07 DIAGNOSIS — Z20822 Contact with and (suspected) exposure to covid-19: Secondary | ICD-10-CM | POA: Diagnosis not present

## 2020-12-07 DIAGNOSIS — Z7982 Long term (current) use of aspirin: Secondary | ICD-10-CM | POA: Diagnosis not present

## 2020-12-07 DIAGNOSIS — E871 Hypo-osmolality and hyponatremia: Secondary | ICD-10-CM | POA: Diagnosis not present

## 2020-12-07 DIAGNOSIS — I9589 Other hypotension: Secondary | ICD-10-CM | POA: Diagnosis not present

## 2020-12-07 DIAGNOSIS — E11649 Type 2 diabetes mellitus with hypoglycemia without coma: Secondary | ICD-10-CM | POA: Diagnosis not present

## 2020-12-07 DIAGNOSIS — Z6835 Body mass index (BMI) 35.0-35.9, adult: Secondary | ICD-10-CM | POA: Diagnosis not present

## 2020-12-07 DIAGNOSIS — I5022 Chronic systolic (congestive) heart failure: Secondary | ICD-10-CM | POA: Diagnosis not present

## 2020-12-07 DIAGNOSIS — I110 Hypertensive heart disease with heart failure: Secondary | ICD-10-CM | POA: Diagnosis not present

## 2020-12-07 LAB — CBC WITH DIFFERENTIAL/PLATELET
Abs Immature Granulocytes: 0.03 10*3/uL (ref 0.00–0.07)
Basophils Absolute: 0 10*3/uL (ref 0.0–0.1)
Basophils Relative: 0 %
Eosinophils Absolute: 0.3 10*3/uL (ref 0.0–0.5)
Eosinophils Relative: 2 %
HCT: 28.9 % — ABNORMAL LOW (ref 39.0–52.0)
Hemoglobin: 10.1 g/dL — ABNORMAL LOW (ref 13.0–17.0)
Immature Granulocytes: 0 %
Lymphocytes Relative: 54 %
Lymphs Abs: 6.2 10*3/uL — ABNORMAL HIGH (ref 0.7–4.0)
MCH: 32.3 pg (ref 26.0–34.0)
MCHC: 34.9 g/dL (ref 30.0–36.0)
MCV: 92.3 fL (ref 80.0–100.0)
Monocytes Absolute: 0.8 10*3/uL (ref 0.1–1.0)
Monocytes Relative: 7 %
Neutro Abs: 4.3 10*3/uL (ref 1.7–7.7)
Neutrophils Relative %: 37 %
Platelets: 160 10*3/uL (ref 150–400)
RBC: 3.13 MIL/uL — ABNORMAL LOW (ref 4.22–5.81)
RDW: 12.6 % (ref 11.5–15.5)
WBC: 11.6 10*3/uL — ABNORMAL HIGH (ref 4.0–10.5)
nRBC: 0 % (ref 0.0–0.2)

## 2020-12-07 LAB — CBG MONITORING, ED
Glucose-Capillary: 63 mg/dL — ABNORMAL LOW (ref 70–99)
Glucose-Capillary: 146 mg/dL — ABNORMAL HIGH (ref 70–99)
Glucose-Capillary: 100 mg/dL — ABNORMAL HIGH (ref 70–99)
Glucose-Capillary: 61 mg/dL — ABNORMAL LOW (ref 70–99)
Glucose-Capillary: 52 mg/dL — ABNORMAL LOW (ref 70–99)
Glucose-Capillary: 123 mg/dL — ABNORMAL HIGH (ref 70–99)

## 2020-12-07 LAB — COMPREHENSIVE METABOLIC PANEL
ALT: 29 U/L (ref 0–44)
AST: 19 U/L (ref 15–41)
Albumin: 4.4 g/dL (ref 3.5–5.0)
Alkaline Phosphatase: 35 U/L — ABNORMAL LOW (ref 38–126)
Anion gap: 10 (ref 5–15)
BUN: 98 mg/dL — ABNORMAL HIGH (ref 6–20)
CO2: 16 mmol/L — ABNORMAL LOW (ref 22–32)
Calcium: 9.1 mg/dL (ref 8.9–10.3)
Chloride: 107 mmol/L (ref 98–111)
Creatinine, Ser: 2.16 mg/dL — ABNORMAL HIGH (ref 0.61–1.24)
GFR, Estimated: 34 mL/min — ABNORMAL LOW (ref >60)
Glucose, Bld: 53 mg/dL — ABNORMAL LOW (ref 70–99)
Potassium: 4.7 mmol/L (ref 3.5–5.1)
Sodium: 133 mmol/L — ABNORMAL LOW (ref 135–145)
Total Bilirubin: 0.7 mg/dL (ref 0.3–1.2)
Total Protein: 7.3 g/dL (ref 6.5–8.1)

## 2020-12-07 LAB — BLOOD GAS, VENOUS
Acid-base deficit: 9.8 mmol/L — ABNORMAL HIGH (ref 0.0–2.0)
Bicarbonate: 16.7 mmol/L — ABNORMAL LOW (ref 20.0–28.0)
O2 Saturation: 69.4 %
Patient temperature: 37
pCO2, Ven: 38 mmHg — ABNORMAL LOW (ref 44.0–60.0)
pH, Ven: 7.25 (ref 7.250–7.430)
pO2, Ven: 43 mmHg (ref 32.0–45.0)

## 2020-12-07 LAB — GLUCOSE, CAPILLARY
Glucose-Capillary: 177 mg/dL — ABNORMAL HIGH (ref 70–99)
Glucose-Capillary: 275 mg/dL — ABNORMAL HIGH (ref 70–99)
Glucose-Capillary: 189 mg/dL — ABNORMAL HIGH (ref 70–99)

## 2020-12-07 LAB — URINALYSIS, COMPLETE (UACMP) WITH MICROSCOPIC
Bacteria, UA: NONE SEEN
Bilirubin Urine: NEGATIVE
Glucose, UA: NEGATIVE mg/dL
Hgb urine dipstick: NEGATIVE
Ketones, ur: NEGATIVE mg/dL
Leukocytes,Ua: NEGATIVE
Nitrite: NEGATIVE
Protein, ur: NEGATIVE mg/dL
Specific Gravity, Urine: 1.01 (ref 1.005–1.030)
Squamous Epithelial / HPF: NONE SEEN (ref 0–5)
pH: 5 (ref 5.0–8.0)

## 2020-12-07 LAB — GASTROINTESTINAL PANEL BY PCR, STOOL (REPLACES STOOL CULTURE)

## 2020-12-07 LAB — PHOSPHORUS: Phosphorus: 5.7 mg/dL — ABNORMAL HIGH (ref 2.5–4.6)

## 2020-12-07 LAB — BASIC METABOLIC PANEL
Anion gap: 11 (ref 5–15)
BUN: 103 mg/dL — ABNORMAL HIGH (ref 6–20)
CO2: 15 mmol/L — ABNORMAL LOW (ref 22–32)
Calcium: 9 mg/dL (ref 8.9–10.3)
Chloride: 104 mmol/L (ref 98–111)
Creatinine, Ser: 2.63 mg/dL — ABNORMAL HIGH (ref 0.61–1.24)
GFR, Estimated: 27 mL/min — ABNORMAL LOW (ref >60)
Glucose, Bld: 58 mg/dL — ABNORMAL LOW (ref 70–99)
Potassium: 4.5 mmol/L (ref 3.5–5.1)
Sodium: 130 mmol/L — ABNORMAL LOW (ref 135–145)

## 2020-12-07 LAB — HEMOGLOBIN A1C
Hgb A1c MFr Bld: 6.8 % — ABNORMAL HIGH (ref 4.8–5.6)
Mean Plasma Glucose: 148.46 mg/dL

## 2020-12-07 LAB — MAGNESIUM: Magnesium: 2.5 mg/dL — ABNORMAL HIGH (ref 1.7–2.4)

## 2020-12-07 LAB — HIV ANTIBODY (ROUTINE TESTING W REFLEX): HIV Screen 4th Generation wRfx: NONREACTIVE

## 2020-12-07 LAB — C DIFFICILE QUICK SCREEN W PCR REFLEX
C Diff antigen: NEGATIVE
C Diff interpretation: NOT DETECTED
C Diff toxin: NEGATIVE

## 2020-12-07 LAB — VITAMIN B12: Vitamin B-12: 293 pg/mL (ref 180–914)

## 2020-12-07 LAB — LACTOFERRIN, FECAL, QUALITATIVE: Lactoferrin, Fecal, Qual: NEGATIVE

## 2020-12-07 LAB — TSH: TSH: 3.804 u[IU]/mL (ref 0.350–4.500)

## 2020-12-07 MED ORDER — INSULIN GLARGINE 100 UNIT/ML ~~LOC~~ SOLN
20.0000 [IU] | Freq: Every day | SUBCUTANEOUS | Status: DC
  Administered 2020-12-07: 20 [IU] via SUBCUTANEOUS
  Filled 2020-12-07 (×3): qty 0.2

## 2020-12-07 MED ORDER — DEXTROSE 50 % IV SOLN
INTRAVENOUS | Status: AC
  Administered 2020-12-07: 50 mL via INTRAVENOUS
  Filled 2020-12-07: qty 50

## 2020-12-07 MED ORDER — ESCITALOPRAM OXALATE 10 MG PO TABS
20.0000 mg | ORAL_TABLET | Freq: Every day | ORAL | Status: DC
  Administered 2020-12-07 – 2020-12-08 (×2): 20 mg via ORAL
  Filled 2020-12-07 (×2): qty 2

## 2020-12-07 MED ORDER — RIFAXIMIN 550 MG PO TABS
550.0000 mg | ORAL_TABLET | Freq: Three times a day (TID) | ORAL | Status: DC
  Administered 2020-12-07 – 2020-12-08 (×2): 550 mg via ORAL
  Filled 2020-12-07 (×4): qty 1

## 2020-12-07 MED ORDER — DEXTROSE 50 % IV SOLN
INTRAVENOUS | Status: AC
  Administered 2020-12-07: 25 g via INTRAVENOUS
  Filled 2020-12-07: qty 50

## 2020-12-07 MED ORDER — SODIUM BICARBONATE 650 MG PO TABS
650.0000 mg | ORAL_TABLET | Freq: Every day | ORAL | Status: DC
  Administered 2020-12-07: 650 mg via ORAL
  Filled 2020-12-07: qty 1

## 2020-12-07 MED ORDER — SODIUM CHLORIDE 0.9 % IV BOLUS
500.0000 mL | Freq: Once | INTRAVENOUS | Status: AC
  Administered 2020-12-07: 500 mL via INTRAVENOUS

## 2020-12-07 MED ORDER — SODIUM CHLORIDE 0.9 % IV BOLUS: 1000.0000 mL | Freq: Once | INTRAVENOUS | Status: DC

## 2020-12-07 MED ORDER — SODIUM CHLORIDE 0.9 % IV SOLN: INTRAVENOUS | Status: DC

## 2020-12-07 MED ORDER — CARVEDILOL 25 MG PO TABS: 25.0000 mg | ORAL_TABLET | Freq: Two times a day (BID) | ORAL | Status: DC

## 2020-12-07 MED ORDER — CARVEDILOL 12.5 MG PO TABS
12.5000 mg | ORAL_TABLET | Freq: Two times a day (BID) | ORAL | Status: DC
  Administered 2020-12-07 – 2020-12-08 (×2): 12.5 mg via ORAL
  Filled 2020-12-07 (×2): qty 1

## 2020-12-07 MED ORDER — SODIUM BICARBONATE 650 MG PO TABS: 650.0000 mg | ORAL_TABLET | Freq: Every day | ORAL | Status: DC

## 2020-12-07 MED ORDER — DEXTROSE 50 % IV SOLN: 50.0000 mL | Freq: Once | INTRAVENOUS | Status: AC

## 2020-12-07 MED ORDER — SODIUM BICARBONATE 650 MG PO TABS
650.0000 mg | ORAL_TABLET | Freq: Two times a day (BID) | ORAL | Status: DC
  Administered 2020-12-07 – 2020-12-08 (×2): 650 mg via ORAL
  Filled 2020-12-07 (×3): qty 1

## 2020-12-07 MED ORDER — ENOXAPARIN SODIUM 60 MG/0.6ML ~~LOC~~ SOLN
55.0000 mg | SUBCUTANEOUS | Status: DC
  Administered 2020-12-07: 55 mg via SUBCUTANEOUS
  Filled 2020-12-07 (×2): qty 0.6

## 2020-12-07 NOTE — ED Notes (Signed)
Amp dextrose 50% given for FSBS 52. Pt asymptomatic

## 2020-12-07 NOTE — Progress Notes (Signed)
PT Cancellation Note  Patient Details Name: KAY RICCIUTI MRN: [id number] DOB: 1961/07/20   Cancelled Treatment:    Reason Eval/Treat Not Completed: PT screened, no needs identified, will sign off (Consult received and chart reviewed.  Per OT, patient mobilizing in room indep; no functional changes or safety needs identified. Appears to be at baseline level of functional ability without acute PT needs. Will complete PT orders; please re-consult should needs change.)   Kristen H. Owens Shark, PT, DPT, NCS 12/07/20, 3:07 PM [phone]

## 2020-12-07 NOTE — Progress Notes (Signed)
PHARMACIST - PHYSICIAN COMMUNICATION  CONCERNING:  Enoxaparin (Lovenox) for DVT Prophylaxis   DESCRIPTION: Patient was prescribed enoxaprin 40mg  q24 hours for VTE prophylaxis.   Filed Weights   12/06/20 1648 12/07/20 1159  Weight: 119.7 kg (264 lb) 111.7 kg (246 lb 3.2 oz)    Body mass index is 33.39 kg/m.  Estimated Creatinine Clearance: 47.5 mL/min (A) (by C-G formula based on SCr of 2.16 mg/dL (H)).   Based on [place] patient is candidate for enoxaparin 0.5mg /kg TBW SQ every 24 hours based on BMI being >30.  RECOMMENDATION: Pharmacy has adjusted enoxaparin dose per [facility] policy.  Patient is now receiving enoxaparin 55 mg every 24 hours    Darnelle Bos, PharmD Clinical Pharmacist  12/07/2020 4:32 PM

## 2020-12-07 NOTE — Evaluation (Signed)
Occupational Therapy Evaluation Patient Details Name: Brian Williams MRN: [id number] DOB: 1961-02-08 Today's Date: 12/07/2020    History of Present Illness 60 y.o. male with medical history significant of  ischemic cardiomyopathy with a EF of 25%, coronary disease, morbid obesity, status post AICD placement,   Clinical Impression    Pt reports living at home with wife and adult children who all work outside of the home during the day. Pt reports being independent at home PTA. He does report, " I get injections in my knees and when they hurt more I use a cane for more support". Pt demonstrated ambulation and self care tasks without assistance and without use of AD this session. No LOB noted. Pt does endorse dizziness when first sitting up but reports this is normal for him and gets better quickly. Pt's MMT all WNLs. All vitals WNLs and on RA. Pt reports still feeling "off" but mostly due to still having diarrhea. OT discussed recommendation for no follow up services and pt agrees at this time. Pt with no skilled OT needs at this time. OT to SIGN OFF. Thank you for this referral.   Follow Up Recommendations  No OT follow up    Equipment Recommendations  None recommended by OT       Precautions / Restrictions Precautions Precautions: None      Mobility Bed Mobility Overal bed mobility: Independent                  Transfers Overall transfer level: Independent               General transfer comment: no physical assistance needed    Balance Overall balance assessment: Modified Independent                                         ADL either performed or assessed with clinical judgement   ADL Overall ADL's : At baseline                                             Vision Patient Visual Report: No change from baseline              Pertinent Vitals/Pain Pain Assessment: No/denies pain     Hand Dominance Right    Extremity/Trunk Assessment Upper Extremity Assessment Upper Extremity Assessment: Overall WFL for tasks assessed   Lower Extremity Assessment Lower Extremity Assessment: Overall WFL for tasks assessed   Cervical / Trunk Assessment Cervical / Trunk Assessment: Normal   Communication Communication Communication: No difficulties   Cognition Arousal/Alertness: Awake/alert Behavior During Therapy: WFL for tasks assessed/performed Overall Cognitive Status: Within Functional Limits for tasks assessed                                     General Comments  no LOB            Home Living Family/patient expects to be discharged to:: Private residence Living Arrangements: Spouse/significant other;Children Available Help at Discharge: Family;Available PRN/intermittently Type of Home: House Home Access: Level entry     Home Layout: One level     Bathroom Shower/Tub: Chief Strategy Officer: Shower  seat;Cane - single point          Prior Functioning/Environment Level of Independence: Independent with assistive device(s)        Comments: Pt reports being indepenent at home. He has been getting shots in knee for quite some time now and reports when they start to "wear off I start to use a walking stick or cane for mobility" . Pt does not endorse any falls at home.         AM-PAC OT "6 Clicks" Daily Activity     Outcome Measure Help from another person eating meals?: None Help from another person taking care of personal grooming?: None Help from another person toileting, which includes using toliet, bedpan, or urinal?: None Help from another person bathing (including washing, rinsing, drying)?: None Help from another person to put on and taking off regular upper body clothing?: None Help from another person to put on and taking off regular lower body clothing?: None 6 Click Score: 24   End of Session Nurse Communication: Mobility  status  Activity Tolerance: Patient tolerated treatment well Patient left: in bed;with call bell/phone within reach                   Time: [id number] OT Time Calculation (min): 21 min Charges:  OT General Charges $OT Visit: 1 Visit OT Evaluation $OT Eval Low Complexity: 1 Low OT Treatments $Self Care/Home Management : 8-22 mins  Darleen Crocker, MS, OTR/L , CBIS ascom [phone]  12/07/20, 10:42 AM

## 2020-12-07 NOTE — ED Notes (Signed)
ot at bedside.

## 2020-12-07 NOTE — ED Notes (Addendum)
Blood glucose is 63, MD Doutova paged to make aware and pt provided with orange juice. Pt alert and oriented.

## 2020-12-07 NOTE — ED Notes (Addendum)
Provided pt with coke per request for FSBS 61. Pt asymptomatic. Pt reports his sugar is usually 50s-60s at night and will increase in the AM. Doutova MD made aware

## 2020-12-07 NOTE — Plan of Care (Signed)

## 2020-12-07 NOTE — Progress Notes (Signed)
PROGRESS NOTE    Brian Williams  WPY:[id number] DOB: Aug 13, 1961 DOA: 12/06/2020 PCP: Pleas Koch, NP   Brief Narrative: 60 year old with past medical history significant for ischemic cardiomyopathy with ejection fraction 25%, CAD, morbid obesity, status post AICD placement who presented with diarrhea for the last 52-month getting worse over the last week.  He reported significant fatigue and weakness and lightheadedness when he tries to stand up.  He also reported nausea and vomiting that started a week ago.  He denies abdominal pain but sometimes cath abdominal cramping with diarrhea.   Assessment & Plan:   Active Problems:   ICD (implantable cardioverter-defibrillator) in place   Diabetes mellitus type 2, uncontrolled, with complications ([place])   Essential hypertension   Hyperlipidemia   Chronic systolic heart failure (HCC)   OSA on CPAP   History of coronary artery stent placement   Coronary artery disease   Diarrhea   Hypotension   Dehydration  1-Acute on chronic diarrhea: -GI pathogen positive for norovirus.  This might explain worsening of chronic diarrhea but would not explain his chronic diarrhea. -We will ask GI to evaluate for chronic diarrhea. -Continue with IV fluids and supportive care -CT abdomen showed fluid and air-filled colon keeping with history of diarrhea.  No significant dilation or inflammatory changes.  2-AKI, dehydration: Metabolic acidosis -Presented with a creatinine of 2.8, prior creatinine 1.01 -Continue to hold diuretics. -Continue with IV fluids. -Start sodium bicarb. -Improving, will  continue with IV fluids.  3-Hypotension: Secondary to hypovolemia: Resolved with IV fluids. Hyperlipidemia: Hypertension: hold diuretics.   4-Diabetes type 2: Continue to hold insulin pump.  He had a couple of low blood sugar. Hemoglobin A1c 6.8 Start low-dose Lantus 20 units daily.  Patient use harmaline IR 500 110 units daily  5-CAD: Continue with  Plavix Continue with Zetia  6-Chronic systolic heart failure; patient presented with hypovolemia.  Hold diuretics Continue to hold Entresto, lasix, spironolactone Resume carvedilol.   7-OSA; With CPAP  8-Anemia: B12: 293, with anemia of chronic disease Hemoglobin stable.     Estimated body mass index is 35.8 kg/m as calculated from the following:   Height as of this encounter: 6' (1.829 m).   Weight as of this encounter: 119.7 kg.   DVT prophylaxis: Lovenox Code Status: Full code Family Communication: Care discussed with patient Disposition Plan:  Status is: Observation  The patient will require care spanning > 2 midnights and should be moved to inpatient because: IV treatments appropriate due to intensity of illness or inability to take PO  Dispo: The patient is from: Home              Anticipated d/c is to: Home              Patient currently is not medically stable to d/c.   Difficult to place patient No        Consultants:   GI  Procedures:   none  Antimicrobials:    Subjective: He report chronic diarrhea for 2 months, has try over the counter meds. His diarrhea got worse recently, accompanied by nausea or vomiting.   Objective: Vitals:   12/07/20 0350 12/07/20 0435 12/07/20 0630 12/07/20 0645  BP: 94/62 98/65 (!) 102/50 (!) 109/54  Pulse: 81 100 67 73  Resp:  19 17 19   Temp:      TempSrc:      SpO2: 100% 100% 100% 99%  Weight:      Height:  Intake/Output Summary (Last 24 hours) at 12/07/2020 0755 Last data filed at 12/06/2020 2351 Gross per 24 hour  Intake 250 ml  Output 200 ml  Net 50 ml   Filed Weights   12/06/20 1648  Weight: 119.7 kg    Examination:  General exam: Appears calm and comfortable  Respiratory system: Clear to auscultation. Respiratory effort normal. Cardiovascular system: S1 & S2 heard, RRR. No JVD, murmurs, rubs, gallops or clicks. No pedal edema. Gastrointestinal system: Abdomen is nondistended, soft and  nontender.  Central nervous system: Alert and oriented. No focal neurological deficits. Extremities: Symmetric 5 x 5 power.    Data Reviewed: I have personally reviewed following labs and imaging studies  CBC: Recent Labs  Lab 12/06/20 1702 12/07/20 0558  WBC 9.0 11.6*  NEUTROABS  --  4.3  HGB 9.7* 10.1*  HCT 27.4* 28.9*  MCV 92.3 92.3  PLT 142* 765   Basic Metabolic Panel: Recent Labs  Lab 12/06/20 1702 12/06/20 1907 12/06/20 2120 12/06/20 2323 12/07/20 0558  NA 132*  --   --  130* 133*  K 4.6  --   --  4.5 4.7  CL 104  --   --  104 107  CO2 14*  --   --  15* 16*  GLUCOSE 75  --   --  58* 53*  BUN 108*  --   --  103* 98*  CREATININE 2.83*  --   --  2.63* 2.16*  CALCIUM 9.6  --   --  9.0 9.1  MG  --  2.5*  --   --  2.5*  PHOS  --   --  6.4*  --  5.7*   GFR: Estimated Creatinine Clearance: 49.2 mL/min (A) (by C-G formula based on SCr of 2.16 mg/dL (H)). Liver Function Tests: Recent Labs  Lab 12/06/20 1702 12/07/20 0558  AST 19 19  ALT 28 29  ALKPHOS 35* 35*  BILITOT 0.7 0.7  PROT 7.6 7.3  ALBUMIN 4.7 4.4   Recent Labs  Lab 12/06/20 1702  LIPASE 35   No results for input(s): AMMONIA in the last 168 hours. Coagulation Profile: No results for input(s): INR, PROTIME in the last 168 hours. Cardiac Enzymes: Recent Labs  Lab 12/06/20 2120  CKTOTAL 43*   BNP (last 3 results) No results for input(s): PROBNP in the last 8760 hours. HbA1C: Recent Labs    12/06/20 2120  HGBA1C 6.8*   CBG: Recent Labs  Lab 12/07/20 0150 12/07/20 0233 12/07/20 0346 12/07/20 0604 12/07/20 0653  GLUCAP 146* 100* 61* 52* 123*   Lipid Profile: No results for input(s): CHOL, HDL, LDLCALC, TRIG, CHOLHDL, LDLDIRECT in the last 72 hours. Thyroid Function Tests: Recent Labs    12/07/20 0558  TSH 3.804   Anemia Panel: Recent Labs    12/06/20 2120  VITAMINB12 293  FOLATE 12.0  FERRITIN 364*  TIBC 487*  IRON 231*  RETICCTPCT 0.8   Sepsis Labs: Recent Labs   Lab 12/06/20 1907 12/06/20 2120  LATICACIDVEN 1.0 1.0    Recent Results (from the past 240 hour(s))  Blood culture (single)     Status: None (Preliminary result)   Collection Time: 12/06/20  7:07 PM   Specimen: BLOOD  Result Value Ref Range Status   Specimen Description BLOOD LEFT ANTECUBITAL  Final   Special Requests   Final    BOTTLES DRAWN AEROBIC AND ANAEROBIC Blood Culture adequate volume   Culture   Final    NO GROWTH < 12 HOURS  Performed at [facility], [address] [place], [place] [address]    Report Status PENDING  Incomplete  Resp Panel by RT-PCR (Flu A&B, Covid) Nasopharyngeal Swab     Status: None   Collection Time: 12/06/20  8:31 PM   Specimen: Nasopharyngeal Swab; Nasopharyngeal(NP) swabs in vial transport medium  Result Value Ref Range Status   SARS Coronavirus 2 by RT PCR NEGATIVE NEGATIVE Final    Comment: (NOTE) SARS-CoV-2 target nucleic acids are NOT DETECTED.  The SARS-CoV-2 RNA is generally detectable in upper respiratory specimens during the acute phase of infection. The lowest concentration of SARS-CoV-2 viral copies this assay can detect is 138 copies/mL. A negative result does not preclude SARS-Cov-2 infection and should not be used as the sole basis for treatment or other patient management decisions. A negative result may occur with  improper specimen collection/handling, submission of specimen other than nasopharyngeal swab, presence of viral mutation(s) within the areas targeted by this assay, and inadequate number of viral copies(<138 copies/mL). A negative result must be combined with clinical observations, patient history, and epidemiological information. The expected result is Negative.  Fact Sheet for Patients:  EntrepreneurPulse.com.au  Fact Sheet for Healthcare Providers:  IncredibleEmployment.be  This test is no t yet approved or cleared by the [place] FDA and  has been  authorized for detection and/or diagnosis of SARS-CoV-2 by FDA under an Emergency Use Authorization (EUA). This EUA will remain  in effect (meaning this test can be used) for the duration of the COVID-19 declaration under Section 564(b)(1) of the Act, 21 U.S.C.section 360bbb-3(b)(1), unless the authorization is terminated  or revoked sooner.       Influenza A by PCR NEGATIVE NEGATIVE Final   Influenza B by PCR NEGATIVE NEGATIVE Final    Comment: (NOTE) The Xpert Xpress SARS-CoV-2/FLU/RSV plus assay is intended as an aid in the diagnosis of influenza from Nasopharyngeal swab specimens and should not be used as a sole basis for treatment. Nasal washings and aspirates are unacceptable for Xpert Xpress SARS-CoV-2/FLU/RSV testing.  Fact Sheet for Patients: EntrepreneurPulse.com.au  Fact Sheet for Healthcare Providers: IncredibleEmployment.be  This test is not yet approved or cleared by the [place] FDA and has been authorized for detection and/or diagnosis of SARS-CoV-2 by FDA under an Emergency Use Authorization (EUA). This EUA will remain in effect (meaning this test can be used) for the duration of the COVID-19 declaration under Section 564(b)(1) of the Act, 21 U.S.C. section 360bbb-3(b)(1), unless the authorization is terminated or revoked.  Performed at [facility], [address] [place], [place] [address]   Gastrointestinal Panel by PCR , Stool     Status: Abnormal   Collection Time: 12/07/20 12:03 AM   Specimen: Stool  Result Value Ref Range Status   Campylobacter species NOT DETECTED NOT DETECTED Final   Plesimonas shigelloides NOT DETECTED NOT DETECTED Final   Salmonella species NOT DETECTED NOT DETECTED Final   Yersinia enterocolitica NOT DETECTED NOT DETECTED Final   Vibrio species NOT DETECTED NOT DETECTED Final   Vibrio cholerae NOT DETECTED NOT DETECTED Final   Enteroaggregative E coli (EAEC) NOT DETECTED NOT  DETECTED Final   Enteropathogenic E coli (EPEC) NOT DETECTED NOT DETECTED Final   Enterotoxigenic E coli (ETEC) NOT DETECTED NOT DETECTED Final   Shiga like toxin producing E coli (STEC) NOT DETECTED NOT DETECTED Final   Shigella/Enteroinvasive E coli (EIEC) NOT DETECTED NOT DETECTED Final   Cryptosporidium NOT DETECTED NOT DETECTED Final   Cyclospora cayetanensis  NOT DETECTED NOT DETECTED Final   Entamoeba histolytica NOT DETECTED NOT DETECTED Final   Giardia lamblia NOT DETECTED NOT DETECTED Final   Adenovirus F40/41 NOT DETECTED NOT DETECTED Final   Astrovirus NOT DETECTED NOT DETECTED Final   Norovirus GI/GII DETECTED (A) NOT DETECTED Final    Comment: RESULT CALLED TO, READ BACK BY AND VERIFIED WITH: KASSIE FELTS RN [phone] 12/07/20 HNM    Rotavirus A NOT DETECTED NOT DETECTED Final   Sapovirus (I, II, IV, and V) NOT DETECTED NOT DETECTED Final    Comment: Performed at [facility], [address] [place], [place] [address]  C Difficile Quick Screen w PCR reflex     Status: None   Collection Time: 12/07/20 12:03 AM   Specimen: Stool  Result Value Ref Range Status   C Diff antigen NEGATIVE NEGATIVE Final   C Diff toxin NEGATIVE NEGATIVE Final   C Diff interpretation No C. difficile detected.  Final    Comment: Performed at [facility], [address] [place], [place] [address]         Radiology Studies: CT ABDOMEN PELVIS WO CONTRAST  Result Date: 12/06/2020 CLINICAL DATA:  Diarrhea EXAM: CT ABDOMEN AND PELVIS WITHOUT CONTRAST TECHNIQUE: Multidetector CT imaging of the abdomen and pelvis was performed following the standard protocol without IV contrast. COMPARISON:  None. FINDINGS: Lower chest: No acute abnormality. Hepatobiliary: No focal liver abnormality is seen. Status post cholecystectomy. No biliary dilatation. Pancreas: Unremarkable. Spleen: Unremarkable. Adrenals/Urinary Tract: Adrenals are unremarkable. Kidneys and bladder are unremarkable.  Stomach/Bowel: Stomach is within normal limits. Bowel is normal in caliber. Fluid and air-filled colon keeping with history. Normal appendix. Vascular/Lymphatic: Aortic atherosclerosis. No enlarged lymph nodes. Reproductive: Unremarkable. Other: No ascites.  No acute abnormality of the abdominal wall. Musculoskeletal: Degenerative changes of the lumbar spine. Marked canal stenosis at L4-L5. IMPRESSION: Fluid and air-filled colon keeping with history of diarrhea. No significant dilatation or inflammatory changes. Electronically Signed   By: Macy Mis M.D.   On: 12/06/2020 20:31   DG Chest Portable 1 View  Result Date: 12/06/2020 CLINICAL DATA:  Hypotension EXAM: PORTABLE CHEST 1 VIEW COMPARISON:  July 13, 2020 FINDINGS: The heart size and mediastinal contours are mildly enlarged. Aortic knob calcifications are seen. A left-sided pacemaker seen with the lead tips in the right atrium right ventricle. There is prominence of the central pulmonary vasculature. No large airspace consolidation is seen. No acute osseous abnormality. IMPRESSION: Mild pulmonary vascular congestion Electronically Signed   By: Prudencio Pair M.D.   On: 12/06/2020 21:04        Scheduled Meds: . aspirin EC  81 mg Oral Daily  . clopidogrel  75 mg Oral Daily  . gabapentin  600 mg Oral TID  . insulin aspart  0-9 Units Subcutaneous Q4H  . sodium bicarbonate  650 mg Oral BID  . traZODone  100 mg Oral QHS   Continuous Infusions: . sodium chloride    . sodium chloride       LOS: 0 days    Time spent: 35 minutes     Karilynn Carranza A Karolina Zamor, MD [facility]   If 7PM-7AM, please contact night-coverage [email or web address]  12/07/2020, 7:55 AM

## 2020-12-07 NOTE — ED Notes (Signed)
Lab stating that pt's GI panel detected norovirus, Damita Dunnings MD paged to make aware of this and pt's BP of 74/62.

## 2020-12-07 NOTE — Consult Note (Signed)
GI Inpatient Consult Note  Reason for Consult: Acute on chronic diarrhea    Attending Requesting Consult: Dr. Niel Hummer, MD  History of Present Illness: Brian Williams is a 60 y.o. male seen for evaluation of acute on chronic diarrhea at the request of Dr. Niel Hummer, MD. Pt has a PMH of ischemic cardiomyopathy with EF 25%, CAD, morbid obesity, s/p AICD placement, T2DM, HTN, HLD, and OSA on CPAP. Pt presented to the [facility] ED last night for chief complaint of acute on chronic diarrhea x 3 months with associated non-intractable nausea and vomiting. He was given an appointment with [facility] scheduled for 01/03/21, but didn't feel like he could make it until then so he decided to come to the ED. Upon presentation to the ED, he was found to be hypotensive and dehydrated with hyponatremia and acute kidney injury but responded well to IV fluid hydration. Stool studies were performed and were positive for norovirus, but negative for C difficile and lactoferrin was negative. CT abd/pelvis w/o contrast showed fluid and air-filled colon without significant dilatation or inflammatory changes. GI was consulted in context of acute on chronic diarrhea. He does endorse that he recently had his Ozempic dose increased from 1/2 mg to 1 mg dose around time his diarrhea started. No other new medications or adjustments. He denies any sick contacts or recent travel. No recent antibiotic use. He denies any associated abdominal pain, but has had some mild lower abdominal cramping associated with BMs. He denies any gross hematochezia or melena. If he fasts, the diarrhea does not get better. He reports initially diarrhea started out as 3-4 loose, watery BMs daily but has now worsened to about 4-5 watery BMs daily. He notes fecal urgency within 15-20 minutes of eating. However, he has also noticed nocturnal diarrhea and nocturnal incontinence which have been concerning to the patient. He feels like over the past two weeks  his symptoms have gotten a lot worse. He has maximized OTC therapy with Imodium and Kaopectate with no improvement in symptoms. He has increased fiber in diet to no avail. Vomiting episodes have occurred after eating and have been non-bloody and non-bilious. He does feel like he gets full quicker than he is used to and he doesn't know why. Pt also reports significant daily use of ibuprofen on a daily basis, usually in excess of 1200 mg daily for neck pain. He has been doing this for a long time. He has been trying to to live a healthier lifestyle over the last 3 months by significantly reducing carbohydrate intake and improve his Hgb A1c%. His A1c% has dropped 2 percentage points over the last 3 months and he reports a 20-lb weight loss. Last colonoscopy 08/2018 for polyp surveillance performed by Dr. Vicente Males showed multiple adenomatous polyps and a 3-year repeat was advised.    Last Colonoscopy:   08/28/2018 (Dr. Vicente Males): - Multiple 5 to 8 mm polyps in the rectum, removed with a hot snare. Resected and retrieved. - One 5 mm polyp in the rectum, removed with a cold snare. Resected and retrieved. - Three 6 to 9 mm polyps in the ascending colon, removed with a cold snare. Resected and retrieved. - One 8 mm polyp in the ascending colon, removed with a hot snare. Resected and retrieved. - Two 4 to 6 mm polyps in the sigmoid colon, removed with a cold snare. Resected and retrieved. - One 3 mm polyp in the sigmoid colon, removed with a cold biopsy forceps. Resected and retrieved. -  The examination was otherwise normal.  Last Endoscopy: N/A   Past Medical History:  Past Medical History:  Diagnosis Date  . AICD (automatic cardioverter/defibrillator) present   . Anxiety    takes Xanax as needed  . Arthritis    back,knees,right shoulder  . Back pain   . Cardiomyopathy ([place])   . CHF (congestive heart failure) (HCC)    takes Lasix and Aldactone daily  . Coronary artery disease    takes Plavix daily  .  Depression    takes Zoloft daily  . Diabetes mellitus without complication (HCC)    Humulin R and Farxiga daily.Fasting blood sugar runs 140  . GERD (gastroesophageal reflux disease)   . Headache   . History of bronchitis   . History of colon polyps    benign  . History of hiatal hernia   . Hyperlipidemia    takes Fenofibrate,Crestor, and Zetia daily  . Hypertension    takes Entresto and Coreg daily  . MI (myocardial infarction) ([place]) 2001  . Obesity   . Peripheral neuropathy   . Pneumonia    history of-last time about 14 yrs ago  . PONV (postoperative nausea and vomiting)    after knee surgery 25 yrs ago b/p stayed elevated for a while  . Presence of permanent cardiac pacemaker   . Shortness of breath dyspnea    with exertion  . Sleep apnea    uses CPAP nightly  . Ventricular tachycardia ([place])    s/p RFCA PVCs 2013    Problem List: Patient Active Problem List   Diagnosis Date Noted  . Diarrhea 12/06/2020  . Hypotension 12/06/2020  . Dehydration 12/06/2020  . Dizziness 11/23/2020  . Neck pain 11/21/2020  . Photophobia 11/21/2020  . Sinusitis, acute 08/08/2020  . Chronic knee pain 07/25/2020  . Penile irritation 10/14/2019  . Myalgia 01/28/2019  . Dry skin 07/08/2018  . Major depressive disorder 07/08/2017  . Erectile dysfunction 07/08/2017  . Chest pain with moderate risk for cardiac etiology 07/26/2016  . Hx of CABG 07/26/2016  . Coronary artery disease 05/24/2016  . Atherosclerosis of native coronary artery of native heart with stable angina pectoris ([place])   . History of coronary artery stent placement   . Unstable angina ([place]) 05/04/2016  . Obesity (BMI 30-39.9) 04/06/2016  . Memory disturbance 01/06/2016  . OSA on CPAP 01/06/2016  . Diabetes, polyneuropathy ([place]) 01/06/2016  . Insomnia 01/06/2016  . Hidradenitis suppurativa 10/06/2015  . Preventative health care 10/06/2015  . Chronic back pain 05/02/2015  . Essential hypertension 05/02/2015  .  Hyperlipidemia 05/02/2015  . Generalized anxiety disorder 05/02/2015  . ICD (implantable cardioverter-defibrillator) in place 04/20/2015  . Congestive dilated cardiomyopathy ([place]) 04/20/2015  . Diabetes mellitus type 2, uncontrolled, with complications ([place]) 35/36/1443  . Chronic systolic heart failure ([place]) 09/30/2014    Past Surgical History: Past Surgical History:  Procedure Laterality Date  . BACK SURGERY    . CARDIAC CATHETERIZATION    . CARDIAC CATHETERIZATION Left 05/10/2016   Procedure: Left Heart Cath and Coronary Angiography;  Surgeon: Minna Merritts, MD;  Location: [facility];  Service: Cardiovascular;  Laterality: Left;  . CARDIAC DEFIBRILLATOR PLACEMENT  10/16/2007   ICD Model number 2207-36 serial number [id number]  . CARDIAC ELECTROPHYSIOLOGY STUDY AND ABLATION    . CHOLECYSTECTOMY  2001  . COLONOSCOPY    . COLONOSCOPY WITH PROPOFOL N/A 08/28/2018   Procedure: COLONOSCOPY WITH PROPOFOL;  Surgeon: Jonathon Bellows, MD;  Location: [facility];  Service: Gastroenterology;  Laterality: N/A;  . CORONARY ANGIOPLASTY WITH STENT PLACEMENT     7 stents  . CORONARY ARTERY BYPASS GRAFT N/A 05/24/2016   Procedure: CORONARY ARTERY BYPASS GRAFTING (CABG) x four , using left internal mammary artery and left leg greater saphenous vein harvested endoscopically;  Surgeon: Gaye Pollack, MD;  Location: [facility];  Service: Open Heart Surgery;  Laterality: N/A;  . EP IMPLANTABLE DEVICE N/A 06/16/2015   Procedure: ICD Generator Changeout;  Surgeon: Deboraha Sprang, MD;  Location: [facility];  Service: Cardiovascular;  Laterality: N/A;  . ESOPHAGOGASTRODUODENOSCOPY    . INSERT / REPLACE / REMOVE PACEMAKER    . KNEE SURGERY     bilateral knee   . [name] WITHOUT CARDIOVERSION N/A 05/24/2016   Procedure: TRANSESOPHAGEAL ECHOCARDIOGRAM ([name]);  Surgeon: Gaye Pollack, MD;  Location: [facility];  Service: Open Heart Surgery;  Laterality: N/A;  . VASECTOMY      Allergies: Allergies  Allergen  Reactions  . Morphine And Related Nausea And Vomiting    Morphine only  . Naproxen Other (See Comments)    Causes hyperactivity    Home Medications: Medications Prior to Admission  Medication Sig Dispense Refill Last Dose  . aspirin EC 81 MG tablet Take 81 mg by mouth daily.  90 tablet  12/06/2020 at 0900  . carvedilol (COREG) 25 MG tablet TAKE 1 TABLET BY MOUTH  TWICE DAILY WITH A MEAL 180 tablet 3 12/06/2020 at 0900  . clopidogrel (PLAVIX) 75 MG tablet TAKE ONE TABLET BY MOUTH DAILY 90 tablet 1 12/06/2020 at 0900  . escitalopram (LEXAPRO) 20 MG tablet TAKE 1 TABLET BY MOUTH  DAILY FOR ANXIETY AND  DEPRESSION 90 tablet 3 12/06/2020 at 0900  . ezetimibe (ZETIA) 10 MG tablet TAKE 1 TABLET BY MOUTH  DAILY 90 tablet 2 12/06/2020 at 0900  . furosemide (LASIX) 40 MG tablet TAKE TWO TABLETS BY MOUTH EVERY MORNING AND TAKE ONE TABLET BY MOUTH EVERY EVENING. MAY TAKE 1 TABLET IN THE AFTERNOON AS NEEDED (Patient taking differently: Take 20 mg by mouth 2 (two) times daily.) 360 tablet 2 12/06/2020 at 0900  . gabapentin (NEURONTIN) 600 MG tablet Take 1 tablet (600 mg total) by mouth 2 (two) times daily. For pain. (Patient taking differently: Take 600 mg by mouth 3 (three) times daily. For pain.) 180 tablet 0 12/06/2020 at 0900  . gemfibrozil (LOPID) 600 MG tablet TAKE 1 TABLET BY MOUTH  DAILY 90 tablet 3 12/06/2020 at 0900  . ibuprofen (ADVIL,MOTRIN) 200 MG tablet Take 600 mg by mouth every 6 (six) hours as needed.   prn at prn  . insulin regular human CONCENTRATED (HUMULIN R) 500 UNIT/ML injection For use in pump, for a total of 130 units/day (Patient taking differently: For use in pump, for a total of 110 units/day) 20 mL 11 12/06/2020 at Unknown time  . rosuvastatin (CRESTOR) 20 MG tablet TAKE 1 TABLET BY MOUTH AT  BEDTIME FOR CHOLESTEROL 90 tablet 3 12/05/2020 at Unknown time  . sacubitril-valsartan (ENTRESTO) 97-103 MG Take 1 tablet by mouth 2 (two) times daily.   12/06/2020 at 0900  . sildenafil (REVATIO) 20 MG  tablet Take 2 to 5 tablets by mouth 1 hour prior to intercourse as needed. 50 tablet 0 prn at prn  . spironolactone (ALDACTONE) 50 MG tablet Take 50 mg by mouth daily.   12/06/2020 at 0900  . traZODone (DESYREL) 100 MG tablet TAKE 2 TABLETS BY MOUTH  EVERY NIGHT AT BEDTIME 180 tablet 3 12/05/2020  at Unknown time   Home medication reconciliation was completed with the patient.   Scheduled Inpatient Medications:   . aspirin EC  81 mg Oral Daily  . carvedilol  12.5 mg Oral BID WC  . clopidogrel  75 mg Oral Daily  . enoxaparin (LOVENOX) injection  55 mg Subcutaneous Q24H  . escitalopram  20 mg Oral Daily  . gabapentin  600 mg Oral TID  . insulin aspart  0-9 Units Subcutaneous Q4H  . insulin glargine  20 Units Subcutaneous Daily  . sodium bicarbonate  650 mg Oral BID  . traZODone  100 mg Oral QHS    Continuous Inpatient Infusions:   . sodium chloride 75 mL/hr at 12/07/20 1855  . sodium chloride      PRN Inpatient Medications:  acetaminophen **OR** acetaminophen, HYDROcodone-acetaminophen  Family History: family history includes Heart attack (age of onset: 18) in his mother; Heart attack (age of onset: 5) in his father; Heart disease in his maternal grandmother and maternal uncle; Hypertension in his father, maternal uncle, and mother; Stroke in his maternal grandmother.  The patient's family history is negative for inflammatory bowel disorders, GI malignancy, or solid organ transplantation.  Social History:   reports that he quit smoking about 2 years ago. His smoking use included cigarettes. He has a 25.00 pack-year smoking history. He has never used smokeless tobacco. He reports that he does not drink alcohol and does not use drugs. The patient denies ETOH, tobacco, or drug use.   Review of Systems: Constitutional: Weight is stable.  Eyes: No changes in vision. ENT: No oral lesions, sore throat.  GI: see HPI.  Heme/Lymph: No easy bruising.  CV: No chest pain.  GU: No hematuria.   Integumentary: No rashes.  Neuro: No headaches.  Psych: No depression/anxiety.  Endocrine: No heat/cold intolerance.  Allergic/Immunologic: No urticaria.  Resp: No cough, SOB.  Musculoskeletal: No joint swelling.    Physical Examination: BP (!) 113/55 (BP Location: Right Arm)   Pulse 72   Temp 99.2 F (37.3 C) (Oral)   Resp 19   Ht 6' (1.829 m)   Wt 111.7 kg   SpO2 100%   BMI 33.39 kg/m   Obese male laying in hospital bed in no acute distress. Wife present and helps to answer questions.  Gen: NAD, alert and oriented x 4 HEENT: PEERLA, EOMI, Neck: supple, no JVD or thyromegaly Chest: CTA bilaterally, no wheezes, crackles, or other adventitious sounds CV: RRR, no m/g/c/r Abd: soft, NT, ND, +BS in all four quadrants; no HSM, guarding, ridigity, or rebound tenderness Ext: no edema, well perfused with 2+ pulses, Skin: no rash or lesions noted Lymph: no LAD  Data: Lab Results  Component Value Date   WBC 11.6 (H) 12/07/2020   HGB 10.1 (L) 12/07/2020   HCT 28.9 (L) 12/07/2020   MCV 92.3 12/07/2020   PLT 160 12/07/2020   Recent Labs  Lab 12/06/20 1702 12/07/20 0558  HGB 9.7* 10.1*   Lab Results  Component Value Date   NA 133 (L) 12/07/2020   K 4.7 12/07/2020   CL 107 12/07/2020   CO2 16 (L) 12/07/2020   BUN 98 (H) 12/07/2020   CREATININE 2.16 (H) 12/07/2020   Lab Results  Component Value Date   ALT 29 12/07/2020   AST 19 12/07/2020   ALKPHOS 35 (L) 12/07/2020   BILITOT 0.7 12/07/2020   No results for input(s): APTT, INR, PTT in the last 168 hours.\  CT abd/pelvis wo contrast 12/06/2020: FINDINGS: Lower  chest: No acute abnormality.  Hepatobiliary: No focal liver abnormality is seen. Status post cholecystectomy. No biliary dilatation.  Pancreas: Unremarkable.  Spleen: Unremarkable.  Adrenals/Urinary Tract: Adrenals are unremarkable. Kidneys and bladder are unremarkable.  Stomach/Bowel: Stomach is within normal limits. Bowel is normal  in caliber. Fluid and air-filled colon keeping with history. Normal appendix.  Vascular/Lymphatic: Aortic atherosclerosis. No enlarged lymph nodes.  Reproductive: Unremarkable.  Other: No ascites.  No acute abnormality of the abdominal wall.  Musculoskeletal: Degenerative changes of the lumbar spine. Marked canal stenosis at L4-L5.  IMPRESSION: Fluid and air-filled colon keeping with history of diarrhea. No significant dilatation or inflammatory changes.   Colonoscopy Path 08/28/2018: DIAGNOSIS:  A. COLON POLYP X 4, ASCENDING; COLD AND HOT SNARE:  - SESSILE SERRATED ADENOMAS, 6 FRAGMENTS, NEGATIVE FOR CYTOLOGIC  DYSPLASIA AND MALIGNANCY.  - TUBULAR ADENOMA, 1 FRAGMENT, NEGATIVE FOR HIGH-GRADE DYSPLASIA AND MALIGNANCY.   B. COLON POLYP X 3, SIGMOID; COLD SNARE AND COLD BIOPSY:  - HYPERPLASTIC POLYPS, 2 FRAGMENTS, NEGATIVE FOR DYSPLASIA AND MALIGNANCY.  - SCANT SUPERFICIAL MUCOSAL FRAGMENTS WITHOUT SPECIFIC PATHOLOGIC CHANGES.   C. RECTUM POLYPS, MULTIPLE; COLD AND HOT SNARE:  - TUBULAR ADENOMA, 3 FRAGMENTS, NEGATIVE FOR HIGH-GRADE DYSPLASIA AND MALIGNANCY.  - HYPERPLASTIC POLYPS,3 FRAGMENTS, NEGATIVE FOR DYSPLASIA AND MALIGNANCY.   Assessment/Plan:  60 y/o Caucasian male with a PMH of ischemic cardiomyopathy with EF 25%, CAD s/p stent placement on chronic antiplatelet therapy, morbid obesity, s/p AICD placement, T2DM, HTN, HLD, and OSA on CPAP presented to [facility] ED for acute on chronic diarrhea x 3 months with non-intractable nausea and vomiting  1. Acute on chronic diarrhea x 3 months - GI PCR showed POSITIVE NOROVIRUS, negative Clostridium difficile toxin, and negative lactoferrin. Norovirus can explain acute worsening gastroenteritis symptoms, but shouldn't explain totality of chronic diarrhea symptoms. Differential includes microscopic colitis, NSAID-induced colitis, post-infectious IBS, SIBO, malignancy, autonomic dysfunction, IBD, small bowel enteropathies,  functional, etc  2. Non-intractable nausea and vomiting - Differential includes acute viral gastroenteritis 2/2 Norovirus, diabetic gastroparesis, gastritis, esophagitis, duodenitis, peptic ulcer disease, nonulcer dyspepsia, medication side effects (ozempic), etc  3. Acute kidney injury - 2/2 dehydration, metabolic acidosis. Improving with IV fluid hydration.  4. T2DM  5. Chronic antiplatelet therapy - on Plavix  6. OSA on CPAP  7. Anemia of chronic disease - hemoglobin stable, no overt gastrointestinal bleeding   COVID-19 TEST NEGATIVE   Recommendations:  - We will check other labs - CRP, celiac disease panel, chromogranin A, random 5-HIAA urine test to rule out other treatable causes of diarrhea - Empirically start Rifaximin 550 mg TID x 14 days - Continue to follow electrolytes and replete as necessary - Avoid artificial sweeteners and sugars - Avoid NSAIDs due to kidney disease and also risk of contributing to diarrhea/colitis  - Low residue/low fiber diet advised - If no clinically response, next step would be a colonoscopy with random biopsies to exclude microscopic colitis. We would need to hold Plavix at least 5 days before procedure so colonosocopy can likely be performed as outpatient - Following  Thank you for the consult. Please call with questions or concerns.  Reeves Forth [facility] [phone] (Cell)

## 2020-12-08 ENCOUNTER — Encounter: Payer: Self-pay | Admitting: Primary Care

## 2020-12-08 DIAGNOSIS — E860 Dehydration: Secondary | ICD-10-CM | POA: Diagnosis not present

## 2020-12-08 DIAGNOSIS — R197 Diarrhea, unspecified: Secondary | ICD-10-CM | POA: Diagnosis not present

## 2020-12-08 DIAGNOSIS — N179 Acute kidney failure, unspecified: Secondary | ICD-10-CM | POA: Diagnosis not present

## 2020-12-08 LAB — CBC
HCT: 24.3 % — ABNORMAL LOW (ref 39.0–52.0)
Hemoglobin: 8.6 g/dL — ABNORMAL LOW (ref 13.0–17.0)
MCH: 32.6 pg (ref 26.0–34.0)
MCHC: 35.4 g/dL (ref 30.0–36.0)
MCV: 92 fL (ref 80.0–100.0)
Platelets: 125 10*3/uL — ABNORMAL LOW (ref 150–400)
RBC: 2.64 MIL/uL — ABNORMAL LOW (ref 4.22–5.81)
RDW: 12.6 % (ref 11.5–15.5)
WBC: 4.5 10*3/uL (ref 4.0–10.5)
nRBC: 0 % (ref 0.0–0.2)

## 2020-12-08 LAB — BASIC METABOLIC PANEL
Anion gap: 6 (ref 5–15)
BUN: 49 mg/dL — ABNORMAL HIGH (ref 6–20)
CO2: 17 mmol/L — ABNORMAL LOW (ref 22–32)
Calcium: 9.1 mg/dL (ref 8.9–10.3)
Chloride: 116 mmol/L — ABNORMAL HIGH (ref 98–111)
Creatinine, Ser: 1.03 mg/dL (ref 0.61–1.24)
GFR, Estimated: >60 mL/min (ref >60)
Glucose, Bld: 166 mg/dL — ABNORMAL HIGH (ref 70–99)
Potassium: 4.8 mmol/L (ref 3.5–5.1)
Sodium: 139 mmol/L (ref 135–145)

## 2020-12-08 LAB — GLUCOSE, CAPILLARY
Glucose-Capillary: 157 mg/dL — ABNORMAL HIGH (ref 70–99)
Glucose-Capillary: 145 mg/dL — ABNORMAL HIGH (ref 70–99)
Glucose-Capillary: 201 mg/dL — ABNORMAL HIGH (ref 70–99)
Glucose-Capillary: 201 mg/dL — ABNORMAL HIGH (ref 70–99)
Glucose-Capillary: 201 mg/dL — ABNORMAL HIGH (ref 70–99)
Glucose-Capillary: 184 mg/dL — ABNORMAL HIGH (ref 70–99)

## 2020-12-08 LAB — C-REACTIVE PROTEIN: CRP: 1.1 mg/dL — ABNORMAL HIGH (ref <1.0)

## 2020-12-08 MED ORDER — CARVEDILOL 12.5 MG PO TABS: 12.5000 mg | ORAL_TABLET | Freq: Two times a day (BID) | ORAL | 2 refills | Status: DC

## 2020-12-08 MED ORDER — SODIUM BICARBONATE 650 MG PO TABS: 650.0000 mg | ORAL_TABLET | Freq: Two times a day (BID) | ORAL | 0 refills | Status: DC

## 2020-12-08 MED ORDER — INSULIN ASPART 100 UNIT/ML ~~LOC~~ SOLN: 0.0000 [IU] | SUBCUTANEOUS | 1 refills | Status: DC

## 2020-12-08 MED ORDER — COLESEVELAM HCL 625 MG PO TABS
1875.0000 mg | ORAL_TABLET | Freq: Two times a day (BID) | ORAL | Status: DC
  Filled 2020-12-08 (×2): qty 3

## 2020-12-08 MED ORDER — RIFAXIMIN 550 MG PO TABS
550.0000 mg | ORAL_TABLET | Freq: Three times a day (TID) | ORAL | 0 refills | Status: AC
Start: 2020-12-08 — End: 2020-12-22

## 2020-12-08 MED ORDER — TRAMADOL HCL 50 MG PO TABS: 50.0000 mg | ORAL_TABLET | Freq: Four times a day (QID) | ORAL | 0 refills | Status: AC | PRN

## 2020-12-08 MED ORDER — INSULIN GLARGINE 100 UNIT/ML ~~LOC~~ SOLN: 20.0000 [IU] | Freq: Every day | SUBCUTANEOUS | 11 refills | Status: DC

## 2020-12-08 MED ORDER — COLESEVELAM HCL 625 MG PO TABS: 1875.0000 mg | ORAL_TABLET | Freq: Two times a day (BID) | ORAL | 0 refills | Status: DC

## 2020-12-08 MED ORDER — FUROSEMIDE 40 MG PO TABS: ORAL_TABLET | ORAL | 2 refills | Status: DC

## 2020-12-08 NOTE — Plan of Care (Signed)

## 2020-12-08 NOTE — Progress Notes (Signed)
Initial Nutrition Assessment  DOCUMENTATION CODES:   Obesity unspecified  INTERVENTION:   -Glucerna Shake po BID, each supplement provides 220 kcal and 10 grams of protein -Ensure Max po daily, each supplement provides 150 kcal and 30 grams of protein.  -MVI with minerals daily   NUTRITION DIAGNOSIS:   Increased nutrient needs related to chronic illness (ischemic cardimyopathy) as evidenced by estimated needs.  GOAL:   Patient will meet greater than or equal to 90% of their needs  MONITOR:   PO intake,Supplement acceptance,Labs,Weight trends,Skin  REASON FOR ASSESSMENT:   Consult Assessment of nutrition requirement/status  ASSESSMENT:   Brian Williams is a 60 y.o. male with medical history significant of  ischemic cardiomyopathy with a EF of 25%, coronary disease, morbid obesity, status post AICD placement,  Pt admitted with AKI and diarrhea.   Reviewed I/O's: +1 L x 24 hours   UOP: 1.6 L x 24 hours  Pt unavailable at time of visit. Attempted to speak with pt via phone, however, unable to reach. Unable to obtain further nutrition-related history or complete nutrition-focused physical exam at this time.   Pt currently on a regular diet. Per RN notes, pt remains with diarrhea. No meal completion data currently available at this time.   Reviewed wt hx; pt has experienced a 8.3% wt loss over the past 6 months, which is not significant for time frame.   Labs reviewed: CBGS: 184-201(inpatient orders for glycemic control are 0-9 units insulin aspart every 4 hours and 20 units insulin glargine daily).   Diet Order:   Diet Order            Diet - low sodium heart healthy           Diet regular Room service appropriate? Yes; Fluid consistency: Thin  Diet effective now                 EDUCATION NEEDS:   No education needs have been identified at this time  Skin:  Skin Assessment: Skin Integrity Issues: Skin Integrity Issues:: Other (Comment) Other: eczema to  bilateral buttocks  Last BM:  12/08/20  Height:   Ht Readings from Last 1 Encounters:  12/06/20 6' (1.829 m)    Weight:   Wt Readings from Last 1 Encounters:  12/08/20 117.8 kg    Ideal Body Weight:  80.9 kg  BMI:  Body mass index is 35.21 kg/m.  Estimated Nutritional Needs:   Kcal:  2200-2400  Protein:  140-160 grams  Fluid:  > 2 L    Loistine Chance, RD, LDN, [place] Registered Dietitian II Certified Diabetes Care and Education Specialist Please refer to [facility] for RD and/or RD on-call/weekend/after hours pager

## 2020-12-08 NOTE — Progress Notes (Addendum)
Inpatient Diabetes Program Recommendations  AACE/ADA: New Consensus Statement on Inpatient Glycemic Control (2015)  Target Ranges:  Prepandial:   less than 140 mg/dL      Peak postprandial:   less than 180 mg/dL (1-2 hours)      Critically ill patients:  140 - 180 mg/dL   Lab Results  Component Value Date   GLUCAP 184 (H) 12/08/2020   HGBA1C 6.8 (H) 12/06/2020    Review of Glycemic Control Results for Brian Williams, Brian Williams (MRN [id number]) as of 12/08/2020 15:18  Ref. Range 12/08/2020 08:24 12/08/2020 08:24 12/08/2020 08:24 12/08/2020 12:13  Glucose-Capillary Latest Ref Range: 70 - 99 mg/dL 201 (H) 201 (H) 201 (H) 184 (H)   Diabetes history: DM 2 Outpatient Diabetes medications:   Insulin pump with U500 insulin  Basal  12A-2.25  7A-4.75  7P-2.25  (total basal 84/24h) Current orders for Inpatient glycemic control:  Novolog sensitive q 4 hours Lantus 20 units daily  Inpatient Diabetes Program Recommendations:    Referral received for reduction in insulin pump settings.  Insulin pump is off at this time and blood sugars <200 mg/dL.  He uses high doses of insulin in pump (U500).  Order received for DM coordinator to adjust insulin pump settings.  Pump is currently not here.  Discussed with patient my concerns regarding him restarting insulin pump with current settings. Patient verbalized understanding.  Called Dr. Ernesto Rutherford office to notify of lows and to ask them to call patient regarding potential need for adjustments to insulin pump prior to patient restarting.   Thanks,  Adah Perl, RN, BC-ADM Inpatient Diabetes Coordinator Pager [phone] (8a-5p)

## 2020-12-08 NOTE — Discharge Summary (Addendum)
Physician Discharge Summary  Brian Williams:[id number] DOB: 1960-09-28 DOA: 12/06/2020  PCP: Pleas Koch, NP  Admit date: 12/06/2020 Discharge date: 12/08/2020  Admitted From: Home  Disposition: Home   Recommendations for Outpatient Follow-up:  1. Follow up with PCP in 1-2 weeks 2. Please obtain BMP/CBC in one week 3. Please follow up on the following pending results: Ova and parasite, Celiac panel, Chromogranin A, Pancreatic elastase.  4. Needs close follow up with cardiologist for further resumption of HF medications, and follow up kidney function.   Home Health: None  Discharge Condition: Stable.  CODE STATUS: Full code Diet recommendation: Heart Healthy   Brief/Interim Summary: 60 year old with past medical history significant for ischemic cardiomyopathy with ejection fraction 25%, CAD, morbid obesity, status post AICD placement who presented with diarrhea for the last 51-month getting worse over the last week.  He reported significant fatigue and weakness and lightheadedness when he tries to stand up.  He also reported nausea and vomiting that started a week ago.  He denies abdominal pain but sometimes has  abdominal cramps  with diarrhea.   1-Acute on chronic diarrhea: -GI pathogen positive for norovirus.  This might explain worsening of chronic diarrhea but would not explain his chronic diarrhea. -Appreciate GI evaluation; Celiac panel, ova and parasite ordered. Patient was started on Rifaximin TID for 14 day. Plan to start [place]. If no improvement patient will need colonoscopy out patient.  -Continue with IV fluids and supportive care. -CT abdomen showed fluid and air-filled colon keeping with history of diarrhea.  No significant dilation or inflammatory changes. -plan  to discharge home with close follow up with GI.   2-AKI, dehydration: Metabolic acidosis:  -Secondary to hypovolemia, diarrhea and diuretics use and NSAID.  -Presented with a creatinine of 2.8,  prior creatinine 1.01 -Continue to hold diuretics at discharge. Could take lasix 40 mg PRN for fluid retention.  -Continue with IV fluids. -Started sodium bicarb. Metabolic acidosis improved. Likely related to diarrhea.  -Improving, treated  with IV fluids. -Will provide prescription for tramadol PRN. He needs to avoid Ibuprofen.   3-Hypotension: Secondary to hypovolemia: Resolved with IV fluids. Hyperlipidemia: Hypertension: hold diuretics.   4-Diabetes type 2: Continue to hold insulin pump.  He had a couple of low blood sugar. Hemoglobin A1c 6.8 Patient use harmaline IR 500 110 units daily Will ask diabetes educator to reduce patient setting to avoid hypoglycemia.   We are not able to reduce insulin pump setting. Plan to discharge on [place] and Novolog until he is able to follow up with his endocrinologist/  5-CAD: Continue with Plavix Continue with Zetia  6-Chronic systolic heart failure; patient presented with hypovolemia.  Hold diuretics Continue to hold Entresto, lasix, spironolactone Resume carvedilol lower home dose.   7-OSA; With CPAP  8-Anemia: B12: 293, with anemia of chronic disease Hemoglobin stable.     Discharge Diagnoses:  Active Problems:   ICD (implantable cardioverter-defibrillator) in place   Diabetes mellitus type 2, uncontrolled, with complications ([place])   Essential hypertension   Hyperlipidemia   Chronic systolic heart failure (HCC)   OSA on CPAP   History of coronary artery stent placement   Coronary artery disease   Diarrhea   Hypotension   Dehydration    Discharge Instructions  Discharge Instructions    Diet - low sodium heart healthy   Complete by: As directed    Increase activity slowly   Complete by: As directed      Allergies as of 12/08/2020  Reactions   Morphine And Related Nausea And Vomiting   Morphine only   Naproxen Other (See Comments)   Causes hyperactivity      Medication List    STOP taking these  medications   ibuprofen 200 MG tablet Commonly known as: ADVIL   insulin regular human CONCENTRATED 500 UNIT/ML injection Commonly known as: HUMULIN R   sacubitril-valsartan 97-103 MG Commonly known as: ENTRESTO   spironolactone 50 MG tablet Commonly known as: ALDACTONE     TAKE these medications   aspirin EC 81 MG tablet Take 81 mg by mouth daily.   carvedilol 12.5 MG tablet Commonly known as: COREG Take 1 tablet (12.5 mg total) by mouth 2 (two) times daily with a meal. What changed:   medication strength  how much to take   clopidogrel 75 MG tablet Commonly known as: PLAVIX TAKE ONE TABLET BY MOUTH DAILY   colesevelam 625 MG tablet Commonly known as: WELCHOL Take 3 tablets (1,875 mg total) by mouth 2 (two) times daily with a meal for 20 days. Increase to three times a day in 3 days if not significant improvement with twice a day doses.   escitalopram 20 MG tablet Commonly known as: LEXAPRO TAKE 1 TABLET BY MOUTH  DAILY FOR ANXIETY AND  DEPRESSION   ezetimibe 10 MG tablet Commonly known as: ZETIA TAKE 1 TABLET BY MOUTH  DAILY   furosemide 40 MG tablet Commonly known as: LASIX Take 40 mg daily PRN for increase fluid legs. Or increase weight more than 2-3 pound in 24 hours. What changed: additional instructions   gabapentin 600 MG tablet Commonly known as: Neurontin Take 1 tablet (600 mg total) by mouth 2 (two) times daily. For pain. What changed: when to take this   gemfibrozil 600 MG tablet Commonly known as: LOPID TAKE 1 TABLET BY MOUTH  DAILY   insulin aspart 100 UNIT/ML injection Commonly known as: novoLOG Inject 0-9 Units into the skin every 4 (four) hours. CBG 70 - 120: 0 units  CBG 121 - 150: 1 unit  CBG 151 - 200: 2 units  CBG 201 - 250: 3 units  CBG 251 - 300: 5 units  CBG 301 - 350: 7 units  CBG 351 - 400 9 units   insulin glargine 100 UNIT/ML injection Commonly known as: LANTUS Inject 0.2 mLs (20 Units total) into the skin daily.    rifaximin 550 MG Tabs tablet Commonly known as: XIFAXAN Take 1 tablet (550 mg total) by mouth 3 (three) times daily for 14 days.   rosuvastatin 20 MG tablet Commonly known as: CRESTOR TAKE 1 TABLET BY MOUTH AT  BEDTIME FOR CHOLESTEROL   sildenafil 20 MG tablet Commonly known as: REVATIO Take 2 to 5 tablets by mouth 1 hour prior to intercourse as needed.   sodium bicarbonate 650 MG tablet Take 1 tablet (650 mg total) by mouth 2 (two) times daily.   traMADol 50 MG tablet Commonly known as: Ultram Take 1 tablet (50 mg total) by mouth every 6 (six) hours as needed for up to 5 days.   traZODone 100 MG tablet Commonly known as: DESYREL TAKE 2 TABLETS BY MOUTH  EVERY NIGHT AT BEDTIME       Follow-up Information    Efrain Sella, MD Follow up in 1 week(s).   Specialty: Gastroenterology Contact information: [place] [address] [phone]        Pleas Koch, NP Follow up in 3 day(s).   Specialty:  Internal Medicine Contact information: [place] [address] [phone]        Minna Merritts, MD .   Specialty: Cardiology Contact information: [address] [place] [address] [phone]              Allergies  Allergen Reactions  . Morphine And Related Nausea And Vomiting    Morphine only  . Naproxen Other (See Comments)    Causes hyperactivity    Consultations:  Gastroenterologist    Procedures/Studies: CT ABDOMEN PELVIS WO CONTRAST  Result Date: 12/06/2020 CLINICAL DATA:  Diarrhea EXAM: CT ABDOMEN AND PELVIS WITHOUT CONTRAST TECHNIQUE: Multidetector CT imaging of the abdomen and pelvis was performed following the standard protocol without IV contrast. COMPARISON:  None. FINDINGS: Lower chest: No acute abnormality. Hepatobiliary: No focal liver abnormality is seen. Status post cholecystectomy. No biliary dilatation. Pancreas: Unremarkable. Spleen: Unremarkable. Adrenals/Urinary  Tract: Adrenals are unremarkable. Kidneys and bladder are unremarkable. Stomach/Bowel: Stomach is within normal limits. Bowel is normal in caliber. Fluid and air-filled colon keeping with history. Normal appendix. Vascular/Lymphatic: Aortic atherosclerosis. No enlarged lymph nodes. Reproductive: Unremarkable. Other: No ascites.  No acute abnormality of the abdominal wall. Musculoskeletal: Degenerative changes of the lumbar spine. Marked canal stenosis at L4-L5. IMPRESSION: Fluid and air-filled colon keeping with history of diarrhea. No significant dilatation or inflammatory changes. Electronically Signed   By: Macy Mis M.D.   On: 12/06/2020 20:31   DG Cervical Spine Complete  Result Date: 11/22/2020 CLINICAL DATA:  Bilateral neck pain EXAM: CERVICAL SPINE - COMPLETE 4+ VIEW COMPARISON:  None. FINDINGS: There is straightening of the cervical spine. No acute fracture or listhesis of the cervical spine. Vertebral body height is preserved. There is endplate remodeling and degenerative annular calcifications noted at C2-C7 in keeping with changes of diffuse mild degenerative disc disease. The lateral masses of C1 are well aligned with the body of C2. The spinal canal is widely patent. The prevertebral soft tissues are not thickened. IMPRESSION: Mild diffuse degenerative disc disease. Electronically Signed   By: Fidela Salisbury MD   On: 11/22/2020 02:18   DG Chest Portable 1 View  Result Date: 12/06/2020 CLINICAL DATA:  Hypotension EXAM: PORTABLE CHEST 1 VIEW COMPARISON:  July 13, 2020 FINDINGS: The heart size and mediastinal contours are mildly enlarged. Aortic knob calcifications are seen. A left-sided pacemaker seen with the lead tips in the right atrium right ventricle. There is prominence of the central pulmonary vasculature. No large airspace consolidation is seen. No acute osseous abnormality. IMPRESSION: Mild pulmonary vascular congestion Electronically Signed   By: Prudencio Pair M.D.   On:  12/06/2020 21:04     Subjective: He report diarrhea is the same today, like for the last 2 months. He has been able to tolerates diet. He feel enough to go home.    Discharge Exam: Vitals:   12/08/20 0959 12/08/20 1214  BP: 123/66 119/73  Pulse: 77 67  Resp: 18 18  Temp: 98 F (36.7 C) 97.6 F (36.4 C)  SpO2: 99% 100%     General: Pt is alert, awake, not in acute distress Cardiovascular: RRR, S1/S2 +, no rubs, no gallops Respiratory: CTA bilaterally, no wheezing, no rhonchi Abdominal: Soft, NT, ND, bowel sounds + Extremities: no edema, no cyanosis    The results of significant diagnostics from this hospitalization (including imaging, microbiology, ancillary and laboratory) are listed below for reference.     Microbiology: Recent Results (from the past 240 hour(s))  Blood culture (single)     Status: None (Preliminary result)   Collection Time: 12/06/20  7:07 PM   Specimen: BLOOD  Result Value Ref Range Status   Specimen Description BLOOD LEFT ANTECUBITAL  Final   Special Requests   Final    BOTTLES DRAWN AEROBIC AND ANAEROBIC Blood Culture adequate volume   Culture   Final    NO GROWTH 2 DAYS Performed at [facility], [address] [place], [place] [address]    Report Status PENDING  Incomplete  Resp Panel by RT-PCR (Flu A&B, Covid) Nasopharyngeal Swab     Status: None   Collection Time: 12/06/20  8:31 PM   Specimen: Nasopharyngeal Swab; Nasopharyngeal(NP) swabs in vial transport medium  Result Value Ref Range Status   SARS Coronavirus 2 by RT PCR NEGATIVE NEGATIVE Final    Comment: (NOTE) SARS-CoV-2 target nucleic acids are NOT DETECTED.  The SARS-CoV-2 RNA is generally detectable in upper respiratory specimens during the acute phase of infection. The lowest concentration of SARS-CoV-2 viral copies this assay can detect is 138 copies/mL. A negative result does not preclude SARS-Cov-2 infection and should not be used as the sole basis for  treatment or other patient management decisions. A negative result may occur with  improper specimen collection/handling, submission of specimen other than nasopharyngeal swab, presence of viral mutation(s) within the areas targeted by this assay, and inadequate number of viral copies(<138 copies/mL). A negative result must be combined with clinical observations, patient history, and epidemiological information. The expected result is Negative.  Fact Sheet for Patients:  EntrepreneurPulse.com.au  Fact Sheet for Healthcare Providers:  IncredibleEmployment.be  This test is no t yet approved or cleared by the [place] FDA and  has been authorized for detection and/or diagnosis of SARS-CoV-2 by FDA under an Emergency Use Authorization (EUA). This EUA will remain  in effect (meaning this test can be used) for the duration of the COVID-19 declaration under Section 564(b)(1) of the Act, 21 U.S.C.section 360bbb-3(b)(1), unless the authorization is terminated  or revoked sooner.       Influenza A by PCR NEGATIVE NEGATIVE Final   Influenza B by PCR NEGATIVE NEGATIVE Final    Comment: (NOTE) The Xpert Xpress SARS-CoV-2/FLU/RSV plus assay is intended as an aid in the diagnosis of influenza from Nasopharyngeal swab specimens and should not be used as a sole basis for treatment. Nasal washings and aspirates are unacceptable for Xpert Xpress SARS-CoV-2/FLU/RSV testing.  Fact Sheet for Patients: EntrepreneurPulse.com.au  Fact Sheet for Healthcare Providers: IncredibleEmployment.be  This test is not yet approved or cleared by the [place] FDA and has been authorized for detection and/or diagnosis of SARS-CoV-2 by FDA under an Emergency Use Authorization (EUA). This EUA will remain in effect (meaning this test can be used) for the duration of the COVID-19 declaration under Section 564(b)(1) of the Act, 21  U.S.C. section 360bbb-3(b)(1), unless the authorization is terminated or revoked.  Performed at [facility], [address] [place], [place] [address]   Gastrointestinal Panel by PCR , Stool     Status: Abnormal   Collection Time: 12/07/20 12:03 AM   Specimen: Stool  Result Value Ref Range Status   Campylobacter species NOT DETECTED NOT DETECTED Final   Plesimonas shigelloides NOT DETECTED NOT DETECTED Final   Salmonella species NOT DETECTED NOT DETECTED Final   Yersinia enterocolitica NOT DETECTED NOT DETECTED Final   Vibrio species NOT DETECTED NOT DETECTED Final   Vibrio cholerae NOT DETECTED NOT DETECTED Final  Enteroaggregative E coli (EAEC) NOT DETECTED NOT DETECTED Final   Enteropathogenic E coli (EPEC) NOT DETECTED NOT DETECTED Final   Enterotoxigenic E coli (ETEC) NOT DETECTED NOT DETECTED Final   Shiga like toxin producing E coli (STEC) NOT DETECTED NOT DETECTED Final   Shigella/Enteroinvasive E coli (EIEC) NOT DETECTED NOT DETECTED Final   Cryptosporidium NOT DETECTED NOT DETECTED Final   Cyclospora cayetanensis NOT DETECTED NOT DETECTED Final   Entamoeba histolytica NOT DETECTED NOT DETECTED Final   Giardia lamblia NOT DETECTED NOT DETECTED Final   Adenovirus F40/41 NOT DETECTED NOT DETECTED Final   Astrovirus NOT DETECTED NOT DETECTED Final   Norovirus GI/GII DETECTED (A) NOT DETECTED Final    Comment: RESULT CALLED TO, READ BACK BY AND VERIFIED WITH: KASSIE FELTS RN [phone] 12/07/20 HNM    Rotavirus A NOT DETECTED NOT DETECTED Final   Sapovirus (I, II, IV, and V) NOT DETECTED NOT DETECTED Final    Comment: Performed at [facility], [address] [place], [place] [address]  C Difficile Quick Screen w PCR reflex     Status: None   Collection Time: 12/07/20 12:03 AM   Specimen: Stool  Result Value Ref Range Status   C Diff antigen NEGATIVE NEGATIVE Final   C Diff toxin NEGATIVE NEGATIVE Final   C Diff interpretation No C. difficile detected.   Final    Comment: Performed at [facility], [address] [place], [place] [address]     Labs: BNP (last 3 results) No results for input(s): BNP in the last 8760 hours. Basic Metabolic Panel: Recent Labs  Lab 12/06/20 1702 12/06/20 1907 12/06/20 2120 12/06/20 2323 12/07/20 0558 12/08/20 0621  NA 132*  --   --  130* 133* 139  K 4.6  --   --  4.5 4.7 4.8  CL 104  --   --  104 107 116*  CO2 14*  --   --  15* 16* 17*  GLUCOSE 75  --   --  58* 53* 166*  BUN 108*  --   --  103* 98* 49*  CREATININE 2.83*  --   --  2.63* 2.16* 1.03  CALCIUM 9.6  --   --  9.0 9.1 9.1  MG  --  2.5*  --   --  2.5*  --   PHOS  --   --  6.4*  --  5.7*  --    Liver Function Tests: Recent Labs  Lab 12/06/20 1702 12/07/20 0558  AST 19 19  ALT 28 29  ALKPHOS 35* 35*  BILITOT 0.7 0.7  PROT 7.6 7.3  ALBUMIN 4.7 4.4   Recent Labs  Lab 12/06/20 1702  LIPASE 35   No results for input(s): AMMONIA in the last 168 hours. CBC: Recent Labs  Lab 12/06/20 1702 12/07/20 0558 12/08/20 0621  WBC 9.0 11.6* 4.5  NEUTROABS  --  4.3  --   HGB 9.7* 10.1* 8.6*  HCT 27.4* 28.9* 24.3*  MCV 92.3 92.3 92.0  PLT 142* 160 125*   Cardiac Enzymes: Recent Labs  Lab 12/06/20 2120  CKTOTAL 43*   BNP: Invalid input(s): POCBNP CBG: Recent Labs  Lab 12/07/20 2216 12/08/20 0106 12/08/20 0437 12/08/20 0824 12/08/20 1213  GLUCAP 189* 157* 145* 201*  201*  201* 184*   D-Dimer No results for input(s): DDIMER in the last 72 hours. Hgb A1c Recent Labs    12/06/20 2120  HGBA1C 6.8*   Lipid Profile No results for input(s): CHOL,  HDL, LDLCALC, TRIG, CHOLHDL, LDLDIRECT in the last 72 hours. Thyroid function studies Recent Labs    12/07/20 0558  TSH 3.804   Anemia work up Recent Labs    12/06/20 2120  VITAMINB12 293  FOLATE 12.0  FERRITIN 364*  TIBC 487*  IRON 231*  RETICCTPCT 0.8   Urinalysis    Component Value Date/Time   COLORURINE YELLOW (A) 12/06/2020 2349   APPEARANCEUR  CLEAR (A) 12/06/2020 2349   LABSPEC 1.010 12/06/2020 2349   PHURINE 5.0 12/06/2020 2349   GLUCOSEU NEGATIVE 12/06/2020 2349   HGBUR NEGATIVE 12/06/2020 2349   BILIRUBINUR NEGATIVE 12/06/2020 2349   BILIRUBINUR Negative 10/14/2019 1025   KETONESUR NEGATIVE 12/06/2020 2349   PROTEINUR NEGATIVE 12/06/2020 2349   UROBILINOGEN 0.2 10/14/2019 1025   NITRITE NEGATIVE 12/06/2020 2349   LEUKOCYTESUR NEGATIVE 12/06/2020 2349   Sepsis Labs Invalid input(s): PROCALCITONIN,  WBC,  LACTICIDVEN Microbiology Recent Results (from the past 240 hour(s))  Blood culture (single)     Status: None (Preliminary result)   Collection Time: 12/06/20  7:07 PM   Specimen: BLOOD  Result Value Ref Range Status   Specimen Description BLOOD LEFT ANTECUBITAL  Final   Special Requests   Final    BOTTLES DRAWN AEROBIC AND ANAEROBIC Blood Culture adequate volume   Culture   Final    NO GROWTH 2 DAYS Performed at [facility], [address], [place], [place] [address]    Report Status PENDING  Incomplete  Resp Panel by RT-PCR (Flu A&B, Covid) Nasopharyngeal Swab     Status: None   Collection Time: 12/06/20  8:31 PM   Specimen: Nasopharyngeal Swab; Nasopharyngeal(NP) swabs in vial transport medium  Result Value Ref Range Status   SARS Coronavirus 2 by RT PCR NEGATIVE NEGATIVE Final    Comment: (NOTE) SARS-CoV-2 target nucleic acids are NOT DETECTED.  The SARS-CoV-2 RNA is generally detectable in upper respiratory specimens during the acute phase of infection. The lowest concentration of SARS-CoV-2 viral copies this assay can detect is 138 copies/mL. A negative result does not preclude SARS-Cov-2 infection and should not be used as the sole basis for treatment or other patient management decisions. A negative result may occur with  improper specimen collection/handling, submission of specimen other than nasopharyngeal swab, presence of viral mutation(s) within the areas targeted by this assay, and  inadequate number of viral copies(<138 copies/mL). A negative result must be combined with clinical observations, patient history, and epidemiological information. The expected result is Negative.  Fact Sheet for Patients:  EntrepreneurPulse.com.au  Fact Sheet for Healthcare Providers:  IncredibleEmployment.be  This test is no t yet approved or cleared by the [place] FDA and  has been authorized for detection and/or diagnosis of SARS-CoV-2 by FDA under an Emergency Use Authorization (EUA). This EUA will remain  in effect (meaning this test can be used) for the duration of the COVID-19 declaration under Section 564(b)(1) of the Act, 21 U.S.C.section 360bbb-3(b)(1), unless the authorization is terminated  or revoked sooner.       Influenza A by PCR NEGATIVE NEGATIVE Final   Influenza B by PCR NEGATIVE NEGATIVE Final    Comment: (NOTE) The Xpert Xpress SARS-CoV-2/FLU/RSV plus assay is intended as an aid in the diagnosis of influenza from Nasopharyngeal swab specimens and should not be used as a sole basis for treatment. Nasal washings and aspirates are unacceptable for Xpert Xpress SARS-CoV-2/FLU/RSV testing.  Fact Sheet for Patients: EntrepreneurPulse.com.au  Fact Sheet for Healthcare Providers: IncredibleEmployment.be  This test is  not yet approved or cleared by the [place] and has been authorized for detection and/or diagnosis of SARS-CoV-2 by FDA under an Emergency Use Authorization (EUA). This EUA will remain in effect (meaning this test can be used) for the duration of the COVID-19 declaration under Section 564(b)(1) of the Act, 21 U.S.C. section 360bbb-3(b)(1), unless the authorization is terminated or revoked.  Performed at [facility], [address] [place], [place] [address]   Gastrointestinal Panel by PCR , Stool     Status: Abnormal   Collection Time: 12/07/20  12:03 AM   Specimen: Stool  Result Value Ref Range Status   Campylobacter species NOT DETECTED NOT DETECTED Final   Plesimonas shigelloides NOT DETECTED NOT DETECTED Final   Salmonella species NOT DETECTED NOT DETECTED Final   Yersinia enterocolitica NOT DETECTED NOT DETECTED Final   Vibrio species NOT DETECTED NOT DETECTED Final   Vibrio cholerae NOT DETECTED NOT DETECTED Final   Enteroaggregative E coli (EAEC) NOT DETECTED NOT DETECTED Final   Enteropathogenic E coli (EPEC) NOT DETECTED NOT DETECTED Final   Enterotoxigenic E coli (ETEC) NOT DETECTED NOT DETECTED Final   Shiga like toxin producing E coli (STEC) NOT DETECTED NOT DETECTED Final   Shigella/Enteroinvasive E coli (EIEC) NOT DETECTED NOT DETECTED Final   Cryptosporidium NOT DETECTED NOT DETECTED Final   Cyclospora cayetanensis NOT DETECTED NOT DETECTED Final   Entamoeba histolytica NOT DETECTED NOT DETECTED Final   Giardia lamblia NOT DETECTED NOT DETECTED Final   Adenovirus F40/41 NOT DETECTED NOT DETECTED Final   Astrovirus NOT DETECTED NOT DETECTED Final   Norovirus GI/GII DETECTED (A) NOT DETECTED Final    Comment: RESULT CALLED TO, READ BACK BY AND VERIFIED WITH: KASSIE FELTS RN [phone] 12/07/20 HNM    Rotavirus A NOT DETECTED NOT DETECTED Final   Sapovirus (I, II, IV, and V) NOT DETECTED NOT DETECTED Final    Comment: Performed at [facility], [address] [place], [place] [address]  C Difficile Quick Screen w PCR reflex     Status: None   Collection Time: 12/07/20 12:03 AM   Specimen: Stool  Result Value Ref Range Status   C Diff antigen NEGATIVE NEGATIVE Final   C Diff toxin NEGATIVE NEGATIVE Final   C Diff interpretation No C. difficile detected.  Final    Comment: Performed at [facility], [address], [place], [place] [address]     Time coordinating discharge: 40 minutes  SIGNED:   Elmarie Shiley, MD  [facility]

## 2020-12-08 NOTE — Progress Notes (Signed)
Patient discharged per orders. PIV and tele removed from patient. AVS reviewed, pt expressed understanding. Declined wheelchair. Walked down by nursing.

## 2020-12-08 NOTE — Progress Notes (Signed)
[facility] Gastroenterology Inpatient Progress Note  Subjective: Patient seen for f/u watery diarrhea. Mildly improved. Electrolytes stable.  Objective: Vital signs in last 24 hours: Temp:  [97.6 F (36.4 C)-99.2 F (37.3 C)] 97.6 F (36.4 C) (03/24 1214) Pulse Rate:  [67-77] 67 (03/24 1214) Resp:  [16-18] 18 (03/24 1214) BP: (100-123)/(49-73) 119/73 (03/24 1214) SpO2:  [98 %-100 %] 100 % (03/24 1214) Weight:  [117.8 kg] 117.8 kg (03/24 1031) Blood pressure 119/73, pulse 67, temperature 97.6 F (36.4 C), resp. rate 18, height 6' (1.829 m), weight 117.8 kg, SpO2 100 %.    Intake/Output from previous day: 03/23 0701 - 03/24 0700 In: 2607.5 [P.O.:360; I.V.:2247.5] Out: 1600 [Urine:1600]  Intake/Output this shift: No intake/output data recorded.   Gen: NAD. Appears comfortable.  HEENT: [place]/AT. PERRLA. Normal external ear exam.  Chest: CTA, no wheezes.  CV: RR nl S1, S2. No gallops.  Abd: soft, nt, nd. BS+  Ext: no edema. Pulses 2+  Neuro: Alert and oriented. Judgement appears normal. Nonfocal.   Lab Results: Results for orders placed or performed during the hospital encounter of 12/06/20 (from the past 24 hour(s))  Glucose, capillary     Status: Abnormal   Collection Time: 12/07/20  5:12 PM  Result Value Ref Range   Glucose-Capillary 275 (H) 70 - 99 mg/dL   Comment 1 Notify RN    Comment 2 Document in Chart   C-reactive protein     Status: Abnormal   Collection Time: 12/07/20  8:23 PM  Result Value Ref Range   CRP 1.1 (H) <1.0 mg/dL  Glucose, capillary     Status: Abnormal   Collection Time: 12/07/20 10:16 PM  Result Value Ref Range   Glucose-Capillary 189 (H) 70 - 99 mg/dL  Glucose, capillary     Status: Abnormal   Collection Time: 12/08/20  1:06 AM  Result Value Ref Range   Glucose-Capillary 157 (H) 70 - 99 mg/dL  Glucose, capillary     Status: Abnormal   Collection Time: 12/08/20  4:37 AM  Result Value Ref Range   Glucose-Capillary 145 (H) 70 - 99  mg/dL  CBC     Status: Abnormal   Collection Time: 12/08/20  6:21 AM  Result Value Ref Range   WBC 4.5 4.0 - 10.5 K/uL   RBC 2.64 (L) 4.22 - 5.81 MIL/uL   Hemoglobin 8.6 (L) 13.0 - 17.0 g/dL   HCT 24.3 (L) 39.0 - 52.0 %   MCV 92.0 80.0 - 100.0 fL   MCH 32.6 26.0 - 34.0 pg   MCHC 35.4 30.0 - 36.0 g/dL   RDW 12.6 11.5 - 15.5 %   Platelets 125 (L) 150 - 400 K/uL   nRBC 0.0 0.0 - 0.2 %  Basic metabolic panel     Status: Abnormal   Collection Time: 12/08/20  6:21 AM  Result Value Ref Range   Sodium 139 135 - 145 mmol/L   Potassium 4.8 3.5 - 5.1 mmol/L   Chloride 116 (H) 98 - 111 mmol/L   CO2 17 (L) 22 - 32 mmol/L   Glucose, Bld 166 (H) 70 - 99 mg/dL   BUN 49 (H) 6 - 20 mg/dL   Creatinine, Ser 1.03 0.61 - 1.24 mg/dL   Calcium 9.1 8.9 - 10.3 mg/dL   GFR, Estimated >60 >60 mL/min   Anion gap 6 5 - 15  Glucose, capillary     Status: Abnormal   Collection Time: 12/08/20  8:24 AM  Result Value Ref Range  Glucose-Capillary 201 (H) 70 - 99 mg/dL  Glucose, capillary     Status: Abnormal   Collection Time: 12/08/20  8:24 AM  Result Value Ref Range   Glucose-Capillary 201 (H) 70 - 99 mg/dL  Glucose, capillary     Status: Abnormal   Collection Time: 12/08/20  8:24 AM  Result Value Ref Range   Glucose-Capillary 201 (H) 70 - 99 mg/dL  Glucose, capillary     Status: Abnormal   Collection Time: 12/08/20 12:13 PM  Result Value Ref Range   Glucose-Capillary 184 (H) 70 - 99 mg/dL     Recent Labs    12/06/20 1702 12/07/20 0558 12/08/20 0621  WBC 9.0 11.6* 4.5  HGB 9.7* 10.1* 8.6*  HCT 27.4* 28.9* 24.3*  PLT 142* 160 125*   BMET Recent Labs    12/06/20 2323 12/07/20 0558 12/08/20 0621  NA 130* 133* 139  K 4.5 4.7 4.8  CL 104 107 116*  CO2 15* 16* 17*  GLUCOSE 58* 53* 166*  BUN 103* 98* 49*  CREATININE 2.63* 2.16* 1.03  CALCIUM 9.0 9.1 9.1   LFT Recent Labs    12/07/20 0558  PROT 7.3  ALBUMIN 4.4  AST 19  ALT 29  ALKPHOS 35*  BILITOT 0.7   PT/INR No results for  input(s): LABPROT, INR in the last 72 hours. Hepatitis Panel No results for input(s): HEPBSAG, HCVAB, HEPAIGM, HEPBIGM in the last 72 hours. C-Diff Recent Labs    12/07/20 0003  CDIFFTOX NEGATIVE   No results for input(s): CDIFFPCR in the last 72 hours.   Studies/Results: CT ABDOMEN PELVIS WO CONTRAST  Result Date: 12/06/2020 CLINICAL DATA:  Diarrhea EXAM: CT ABDOMEN AND PELVIS WITHOUT CONTRAST TECHNIQUE: Multidetector CT imaging of the abdomen and pelvis was performed following the standard protocol without IV contrast. COMPARISON:  None. FINDINGS: Lower chest: No acute abnormality. Hepatobiliary: No focal liver abnormality is seen. Status post cholecystectomy. No biliary dilatation. Pancreas: Unremarkable. Spleen: Unremarkable. Adrenals/Urinary Tract: Adrenals are unremarkable. Kidneys and bladder are unremarkable. Stomach/Bowel: Stomach is within normal limits. Bowel is normal in caliber. Fluid and air-filled colon keeping with history. Normal appendix. Vascular/Lymphatic: Aortic atherosclerosis. No enlarged lymph nodes. Reproductive: Unremarkable. Other: No ascites.  No acute abnormality of the abdominal wall. Musculoskeletal: Degenerative changes of the lumbar spine. Marked canal stenosis at L4-L5. IMPRESSION: Fluid and air-filled colon keeping with history of diarrhea. No significant dilatation or inflammatory changes. Electronically Signed   By: Macy Mis M.D.   On: 12/06/2020 20:31   DG Chest Portable 1 View  Result Date: 12/06/2020 CLINICAL DATA:  Hypotension EXAM: PORTABLE CHEST 1 VIEW COMPARISON:  July 13, 2020 FINDINGS: The heart size and mediastinal contours are mildly enlarged. Aortic knob calcifications are seen. A left-sided pacemaker seen with the lead tips in the right atrium right ventricle. There is prominence of the central pulmonary vasculature. No large airspace consolidation is seen. No acute osseous abnormality. IMPRESSION: Mild pulmonary vascular congestion  Electronically Signed   By: Prudencio Pair M.D.   On: 12/06/2020 21:04    Scheduled Inpatient Medications:   . aspirin EC  81 mg Oral Daily  . carvedilol  12.5 mg Oral BID WC  . clopidogrel  75 mg Oral Daily  . colesevelam  1,875 mg Oral BID WC  . enoxaparin (LOVENOX) injection  55 mg Subcutaneous Q24H  . escitalopram  20 mg Oral Daily  . gabapentin  600 mg Oral TID  . insulin aspart  0-9 Units Subcutaneous Q4H  . insulin  glargine  20 Units Subcutaneous Daily  . rifaximin  550 mg Oral TID  . sodium bicarbonate  650 mg Oral BID  . traZODone  100 mg Oral QHS    Continuous Inpatient Infusions:   . sodium chloride      PRN Inpatient Medications:  acetaminophen **OR** acetaminophen, HYDROcodone-acetaminophen    Assessment:  1. Acute on chronic diarrhea - Improved with stable fluid status. NOROVIRUS positive of questionable clinical significance. Studies pending to evaluate for endocrinologic causes.  Plan:  1. Welchol 625mg  3 po bid x 3 days, increase to 3 tabs po tid if no significant improvement. 2. Follow up in my office in 3-4 weeks with Octavia Bruckner, PA-C. 3. Plan discussed with Dr. Tyrell Antonio.  Teodoro K. Alice Reichert, M.D. 12/08/2020, 2:12 PM

## 2020-12-09 ENCOUNTER — Telehealth: Payer: Self-pay | Admitting: Cardiovascular Disease

## 2020-12-09 ENCOUNTER — Telehealth: Payer: Self-pay

## 2020-12-09 LAB — CHROMOGRANIN A: Chromogranin A (ng/mL): 205.2 ng/mL — ABNORMAL HIGH (ref 0.0–101.8)

## 2020-12-09 LAB — PANCREATIC ELASTASE, FECAL

## 2020-12-09 NOTE — Telephone Encounter (Signed)
Pt c/o BP issue: STAT if pt c/o blurred vision, one-sided weakness or slurred speech  1. What are your last 5 BP readings?  18th 85/54 19th 8549 21st 77/46 22nd 80/50  After getting out of hospital  127/69  2. Are you having any other symptoms (ex. Dizziness, headache, blurred vision, passed out)? Not now, was taken off BP meds in hospital   3. What is your BP issue? Low BP   Patient scheduled to see Dr Rockey Situ on 04/19.  Prefers to see Dr Rockey Situ but will see an assistant if needed.

## 2020-12-09 NOTE — Telephone Encounter (Addendum)
Spoke with the patient. He was recently hospitalized for low BP and AKI. He was d/c from [facility] yesterday.  His diuretics and some of his BP medications were stopped/held. He was advised to f/u with Cardiology asap for future management of his HF medications.   Patient was scheduled by scheduling for an appt with Dr. Rockey Situ on 01/03/21. He is concerned about how far out that appt has been scheduled and feels that his medication changes will need to be addressed sooner. Patient is feeling better today and his BP today 127/69. Adv the patient that I was in agreement that he should been seen sooner. Reviewed Dr. Donivan Scull and the APPs schedules. Our provider appts are limited.  Rescheduled the appt with Dr. Rockey Situ to 12/12/20 @ 3:20pm. He will need a bmp and cbc the same day. Adv the patient to monitor his BP and HR twice a day for the next several days and to brig the readings with him to the appt. Patient verbalized understanding and was very appreciative for the call back.

## 2020-12-09 NOTE — Telephone Encounter (Signed)
Transition Care Management Follow-up Telephone Call  Date of discharge and from where: 12/08/2020, [facility]  How have you been since you were released from the hospital? Patient is feeling much better  Any questions or concerns? No  Items Reviewed:  Did the pt receive and understand the discharge instructions provided? Yes   Medications obtained and verified? Yes   Other? No   Any new allergies since your discharge? No   Dietary orders reviewed? Yes  Do you have support at home? Yes   Home Care and Equipment/Supplies: Were home health services ordered? not applicable If so, what is the name of the agency? N/A  Has the agency set up a time to come to the patient's home? not applicable Were any new equipment or medical supplies ordered?  No What is the name of the medical supply agency? N/A Were you able to get the supplies/equipment? not applicable Do you have any questions related to the use of the equipment or supplies? No  Functional Questionnaire: (I = Independent and D = Dependent) ADLs: I  Bathing/Dressing- I  Meal Prep- I  Eating- I  Maintaining continence- I  Transferring/Ambulation- I  Managing Meds- I  Follow up appointments reviewed:   PCP Hospital f/u appt confirmed? No  following up with gastroenterology and cardiology  Specialist Hospital f/u appt confirmed? Yes  Scheduled to see cardiology on 12/12/2020 @ 3:20 pm.  Are transportation arrangements needed? No   If their condition worsens, is the pt aware to call PCP or go to the Emergency Dept.? Yes  Was the patient provided with contact information for the PCP's office or ED? Yes  Was to pt encouraged to call back with questions or concerns? Yes

## 2020-12-10 LAB — TISSUE TRANSGLUTAMINASE, IGG: Tissue Transglut Ab: <2 U/mL (ref 0–5)

## 2020-12-10 LAB — CELIAC DISEASE PANEL
Endomysial Ab, IgA: NEGATIVE
IgA: 87 mg/dL — ABNORMAL LOW (ref 90–386)
Tissue Transglutaminase Ab, IgA: <2 U/mL (ref 0–3)

## 2020-12-11 LAB — CULTURE, BLOOD (SINGLE)
Culture: NO GROWTH
Special Requests: ADEQUATE

## 2020-12-11 NOTE — Progress Notes (Signed)
Cardiology Office Note  Date:  12/12/2020   ID:  Brian Williams, DOB 1960/12/10, MRN [id number]  PCP:  Pleas Koch, NP   Chief Complaint  Patient presents with  . Other    Hospital follow up - Patient c/o SOB. Meds reviewed verbally with patient.     HPI:  [name] is a pleasant 60 year old gentleman with history of  coronary artery disease,  long history of smoking,  diabetes type 2 poorly controlled with hemoglobin A1c of 9, followed by endocrine/Dr. Gabriel Williams, cardiac catheterization 12 dating back to 2002,  ischemic cardiomyopathy,  echocardiogram March 2016 showing ejection fraction 20-25%,   sustained VT, VT ablation in [place] at [facility] sleep apnea, on CPAP, generator change in 2016, ICD in place,  previously seen by seen by [facility] cardiology heart failure/transplant team, Brian Williams.  EF 35% in 2017 Last cath 06/2019 who presents for routine follow-up of his coronary artery disease, CABG, September 2017  LOV 06/2020 In the ER, admitted 12/08/2020 Acute on chronic diarrhea:  dehydration: Metabolic acidosis GI pathogen positive for norovirus. Rifaximin TID for 14 day, too expensive Changed to bactrim  Weight down 40 pounds, intentional, changed diet Blood pressure down, 80 systolic before hospital  In hospital, had IV fluids, Post hospital, systolic pressure: 370 to 488 systolic  Started on gabapentin 600 TID by neurology  For neck pain 12/01/2020  Taking lasix 40 daily  Drinking coke, for upset stomach Sugars elevated  Hemoglobin A1c previously around 9-10, most recently running 6.8 after weight loss  EKG personally reviewed by myself on todays visit Shows normal sinus rhythm rate 73 bpm, old anterior MI, nonspecific ST ABN  Other past medical history reviewed Cath at 06/2019 at [facility] . There is Significant 3-vessel coronary artery disease including 60%  stenosis of distal LMCA, complete occlusion of the mLAD & mRCA and 80-90%  stenosis of OM2 &  OM3.  2. Patent LIMA-mLAD, VG-rPDA and VG-OM2. VG-D known to be occluded.  3. Normal right and left ventricular filling pressures.  4. Normal cardiac output.   CABG By Dr. Cyndia Bent September 2017 Left internal mammary graft to the LAD SVG to diagonal, SVG to OM, SVG to PDA  cath on 05/10/2016 showing a 70% eccentric distal LM stenosis, 80% proximal LAD in-stent restenosis, 80% diagonal stenosis, and tandem 90% proximal and mid RCA stenoses with mild in-stent narrowing. The LVEF is visually 35-45%  Previously seen in [place] for his cardiac issues. History dates back to 2002 when he had distal RCA disease. Several catheterizations over the next several years for in-stent restenosis of the distal RCA. Later cath, stent placed to the LAD. Most recent catheterization appears to be April 2011 showing patent LAD and RCA stent.  Despite this, he has had a decline in his ejection fraction over the past several years requiring defibrillator for ventricular tachycardia. Arrhythmia seems to be controlled on nadolol (was previously on 80 mg twice a day, decreased down to daily secondary to low blood pressure and fatigue)  seen by [facility] cardiology heart failure/transplant team, Brian Williams.   PMH:   has a past medical history of AICD (automatic cardioverter/defibrillator) present, Anxiety, Arthritis, Back pain, Cardiomyopathy ([place]), CHF (congestive heart failure) ([place]), Coronary artery disease, Depression, Diabetes mellitus without complication ([place]), GERD (gastroesophageal reflux disease), Headache, History of bronchitis, History of colon polyps, History of hiatal hernia, Hyperlipidemia, Hypertension, MI (myocardial infarction) ([place]) (2001), Obesity, Peripheral neuropathy, Pneumonia, PONV (postoperative nausea and vomiting), Presence of permanent cardiac pacemaker, Shortness of breath  dyspnea, Sleep apnea, and Ventricular tachycardia ([place]).  PSH:    Past Surgical History:  Procedure Laterality  Date  . BACK SURGERY    . CARDIAC CATHETERIZATION    . CARDIAC CATHETERIZATION Left 05/10/2016   Procedure: Left Heart Cath and Coronary Angiography;  Surgeon: Minna Merritts, MD;  Location: [place] CV LAB;  Service: Cardiovascular;  Laterality: Left;  . CARDIAC DEFIBRILLATOR PLACEMENT  10/16/2007   ICD Model number 2207-36 serial number [id number]  . CARDIAC ELECTROPHYSIOLOGY STUDY AND ABLATION    . CHOLECYSTECTOMY  2001  . COLONOSCOPY    . COLONOSCOPY WITH PROPOFOL N/A 08/28/2018   Procedure: COLONOSCOPY WITH PROPOFOL;  Surgeon: Jonathon Bellows, MD;  Location: [facility] ENDOSCOPY;  Service: Gastroenterology;  Laterality: N/A;  . CORONARY ANGIOPLASTY WITH STENT PLACEMENT     7 stents  . CORONARY ARTERY BYPASS GRAFT N/A 05/24/2016   Procedure: CORONARY ARTERY BYPASS GRAFTING (CABG) x four , using left internal mammary artery and left leg greater saphenous vein harvested endoscopically;  Surgeon: Gaye Pollack, MD;  Location: [place] OR;  Service: Open Heart Surgery;  Laterality: N/A;  . EP IMPLANTABLE DEVICE N/A 06/16/2015   Procedure: ICD Generator Changeout;  Surgeon: Deboraha Sprang, MD;  Location: [place] CV LAB;  Service: Cardiovascular;  Laterality: N/A;  . ESOPHAGOGASTRODUODENOSCOPY    . INSERT / REPLACE / REMOVE PACEMAKER    . KNEE SURGERY     bilateral knee   . [name] WITHOUT CARDIOVERSION N/A 05/24/2016   Procedure: TRANSESOPHAGEAL ECHOCARDIOGRAM ([name]);  Surgeon: Gaye Pollack, MD;  Location: [place];  Service: Open Heart Surgery;  Laterality: N/A;  . VASECTOMY      Current Outpatient Medications  Medication Sig Dispense Refill  . aspirin EC 81 MG tablet Take 81 mg by mouth daily.  90 tablet   . carvedilol (COREG) 12.5 MG tablet Take 12.5 mg by mouth daily.    . clopidogrel (PLAVIX) 75 MG tablet TAKE ONE TABLET BY MOUTH DAILY 90 tablet 1  . colesevelam (WELCHOL) 625 MG tablet Take 3 tablets (1,875 mg total) by mouth 2 (two) times daily with a meal for 20 days. Increase to three times a day  in 3 days if not significant improvement with twice a day doses. 120 tablet 0  . escitalopram (LEXAPRO) 20 MG tablet TAKE 1 TABLET BY MOUTH  DAILY FOR ANXIETY AND  DEPRESSION 90 tablet 3  . ezetimibe (ZETIA) 10 MG tablet TAKE 1 TABLET BY MOUTH  DAILY 90 tablet 2  . furosemide (LASIX) 40 MG tablet Take 40 mg daily PRN for increase fluid legs. Or increase weight more than 2-3 pound in 24 hours. 30 tablet 2  . gabapentin (NEURONTIN) 600 MG tablet Take 600 mg by mouth 3 (three) times daily.    Marland Kitchen gemfibrozil (LOPID) 600 MG tablet TAKE 1 TABLET BY MOUTH  DAILY 90 tablet 3  . insulin aspart (NOVOLOG) 100 UNIT/ML injection Inject 0-9 Units into the skin every 4 (four) hours. CBG 70 - 120: 0 units  CBG 121 - 150: 1 unit  CBG 151 - 200: 2 units  CBG 201 - 250: 3 units  CBG 251 - 300: 5 units  CBG 301 - 350: 7 units  CBG 351 - 400 9 units 10 mL 1  . insulin glargine (LANTUS) 100 UNIT/ML injection Inject 0.2 mLs (20 Units total) into the skin daily. 10 mL 11  . rifaximin (XIFAXAN) 550 MG TABS tablet Take 1 tablet (550 mg total) by mouth 3 (  three) times daily for 14 days. 42 tablet 0  . rosuvastatin (CRESTOR) 20 MG tablet TAKE 1 TABLET BY MOUTH AT  BEDTIME FOR CHOLESTEROL 90 tablet 3  . sildenafil (REVATIO) 20 MG tablet Take 2 to 5 tablets by mouth 1 hour prior to intercourse as needed. 50 tablet 0  . sodium bicarbonate 650 MG tablet Take 1 tablet (650 mg total) by mouth 2 (two) times daily. 30 tablet 0  . traMADol (ULTRAM) 50 MG tablet Take 1 tablet (50 mg total) by mouth every 6 (six) hours as needed for up to 5 days. 20 tablet 0  . traZODone (DESYREL) 100 MG tablet TAKE 2 TABLETS BY MOUTH  EVERY NIGHT AT BEDTIME 180 tablet 3   No current facility-administered medications for this visit.     Allergies:   Morphine and related and Naproxen   Social History:  The patient  reports that he quit smoking about 2 years ago. His smoking use included cigarettes. He has a 25.00 pack-year smoking history. He has  never used smokeless tobacco. He reports that he does not drink alcohol and does not use drugs.   Family History:   family history includes Heart attack (age of onset: 75) in his mother; Heart attack (age of onset: 35) in his father; Heart disease in his maternal grandmother and maternal uncle; Hypertension in his father, maternal uncle, and mother; Stroke in his maternal grandmother.    Review of Systems: Review of Systems  Constitutional: Positive for malaise/fatigue.  HENT: Negative.   Respiratory: Negative.   Cardiovascular: Negative.   Gastrointestinal: Negative.   Musculoskeletal: Negative.        Leg pain  Neurological: Negative.   Psychiatric/Behavioral: Negative.   All other systems reviewed and are negative.   PHYSICAL EXAM: VS:  BP 90/60 (BP Location: Left Arm, Patient Position: Sitting, Cuff Size: Large)   Pulse 73   Ht 6' (1.829 m)   Wt 264 lb (119.7 kg)   SpO2 98%   BMI 35.80 kg/m  , BMI Body mass index is 35.8 kg/m. Constitutional:  oriented to person, place, and time. No distress.  HENT:  Head: Grossly normal Eyes:  no discharge. No scleral icterus.  Neck: No JVD, no carotid bruits  Cardiovascular: Regular rate and rhythm, no murmurs appreciated Pulmonary/Chest: Clear to auscultation bilaterally, no wheezes or rails Abdominal: Soft.  no distension.  no tenderness.  Musculoskeletal: Normal range of motion Neurological:  normal muscle tone. Coordination normal. No atrophy Skin: Skin warm and dry Psychiatric: normal affect, pleasant   Recent Labs: 12/07/2020: ALT 29; Magnesium 2.5; TSH 3.804 12/08/2020: BUN 49; Creatinine, Ser 1.03; Hemoglobin 8.6; Platelets 125; Potassium 4.8; Sodium 139    Lipid Panel Lab Results  Component Value Date   CHOL 125 07/22/2020   HDL 32.10 (L) 07/22/2020   LDLCALC 65 07/22/2020   TRIG 139.0 07/22/2020      Wt Readings from Last 3 Encounters:  12/12/20 264 lb (119.7 kg)  12/08/20 259 lb 9.6 oz (117.8 kg)  12/06/20  268 lb 12 oz (121.9 kg)     ASSESSMENT AND PLAN:  Congestive dilated cardiomyopathy ([place]) - Plan: EKG 12-Lead Appears euvolemic if not prerenal Hypotensive on today's visit We will decrease carvedilol down to 3.125 twice daily Suggest Lasix 40 every other day as he has poor fluid intake secondary to nausea in the setting of norovirus Suggested he call our office with blood pressure measurements in the next 2 weeks Most recent pressures have been low at  home  Essential hypertension - Plan: EKG 12-Lead Blood pressure measuring around 161 systolic at home, 90 systolic on today's visit Changes as above  Chronic systolic heart failure (HCC) -  Breathing improved, suggest Lasix every other day while p.o. intake is poor then back to Lasix 40 daily once appetite and fluid intake improves  Diarrhea/norovirus Followed by GI, about to start Bactrim, Zofran As nausea, poor fluid intake  Stable angina (HCC) Currently with no symptoms of angina. No further workup at this time. Continue current medication regimen.  Morbid obesity with BMI of 40.0-44.9, adult (HCC) Modified his diet, weight down 40 pounds  Hx of CABG Chronic stable anginal symptoms Denies significant symptoms on today's visit  OSA on CPAP Recommended compliance with his CPAP  Pure hypercholesterolemia Continue Crestor and Zetia  Active smoker Current non-smoker  Diabetes type 2 with complications W9U dramatically improved with weight loss   Total encounter time more than 35 minutes  Greater than 50% was spent in counseling and coordination of care with the patient    No orders of the defined types were placed in this encounter.    Signed, Esmond Plants, M.D., Ph.D. 12/12/2020  [place], [place]

## 2020-12-12 ENCOUNTER — Other Ambulatory Visit: Payer: Self-pay

## 2020-12-12 ENCOUNTER — Encounter: Payer: Self-pay | Admitting: Cardiovascular Disease

## 2020-12-12 ENCOUNTER — Ambulatory Visit (INDEPENDENT_AMBULATORY_CARE_PROVIDER_SITE_OTHER): Payer: Medicare Other | Admitting: Cardiovascular Disease

## 2020-12-12 VITALS — BP 90/60 | HR 73 | Ht 72.0 in | Wt 264.0 lb

## 2020-12-12 DIAGNOSIS — I10 Essential (primary) hypertension: Secondary | ICD-10-CM

## 2020-12-12 DIAGNOSIS — I25118 Atherosclerotic heart disease of native coronary artery with other forms of angina pectoris: Secondary | ICD-10-CM

## 2020-12-12 DIAGNOSIS — I255 Ischemic cardiomyopathy: Secondary | ICD-10-CM | POA: Diagnosis not present

## 2020-12-12 DIAGNOSIS — E118 Type 2 diabetes mellitus with unspecified complications: Secondary | ICD-10-CM | POA: Diagnosis not present

## 2020-12-12 DIAGNOSIS — IMO0002 Reserved for concepts with insufficient information to code with codable children: Secondary | ICD-10-CM

## 2020-12-12 DIAGNOSIS — G4733 Obstructive sleep apnea (adult) (pediatric): Secondary | ICD-10-CM

## 2020-12-12 DIAGNOSIS — E1142 Type 2 diabetes mellitus with diabetic polyneuropathy: Secondary | ICD-10-CM

## 2020-12-12 DIAGNOSIS — E1165 Type 2 diabetes mellitus with hyperglycemia: Secondary | ICD-10-CM

## 2020-12-12 DIAGNOSIS — Z9989 Dependence on other enabling machines and devices: Secondary | ICD-10-CM

## 2020-12-12 DIAGNOSIS — I5022 Chronic systolic (congestive) heart failure: Secondary | ICD-10-CM | POA: Diagnosis not present

## 2020-12-12 DIAGNOSIS — Z95810 Presence of automatic (implantable) cardiac defibrillator: Secondary | ICD-10-CM

## 2020-12-12 LAB — O&P RESULT

## 2020-12-12 LAB — OVA + PARASITE EXAM

## 2020-12-12 MED ORDER — CARVEDILOL 3.125 MG PO TABS: 3.1250 mg | ORAL_TABLET | Freq: Two times a day (BID) | ORAL | 3 refills | Status: DC

## 2020-12-12 NOTE — Patient Instructions (Addendum)
Medication Instructions:  Please decrease the coreg down to 3.125 mg twice a day  Consider lasix every other day until stomach is better and fluid intake better  If you need a refill on your cardiac medications before your next appointment, please call your pharmacy.   Lab work: No new labs needed   If you have labs (blood work) drawn today and your tests are completely normal, you will receive your results only by: Marland Kitchen MyChart Message (if you have MyChart) OR . A paper copy in the mail If you have any lab test that is abnormal or we need to change your treatment, we will call you to review the results.   Testing/Procedures: No new testing needed   Follow-Up: At [facility], you and your health needs are our priority.  As part of our continuing mission to provide you with exceptional heart care, we have created designated Provider Care Teams.  These Care Teams include your primary Cardiologist (physician) and Advanced Practice Providers (APPs -  Physician Assistants and Nurse Practitioners) who all work together to provide you with the care you need, when you need it.  . You will need a follow up appointment in 3 months  . Providers on your designated Care Team:   . Murray Hodgkins, NP . Christell Faith, PA-C . Marrianne Mood, PA-C  Any Other Special Instructions Will Be Listed Below (If Applicable).  COVID-19 Vaccine Information can be found at: [email or web address].uk For questions related to vaccine distribution or appointments, please email [email or web address] or call [phone].

## 2020-12-27 DIAGNOSIS — E669 Obesity, unspecified: Secondary | ICD-10-CM | POA: Diagnosis not present

## 2020-12-27 DIAGNOSIS — I509 Heart failure, unspecified: Secondary | ICD-10-CM | POA: Diagnosis not present

## 2020-12-27 DIAGNOSIS — E1165 Type 2 diabetes mellitus with hyperglycemia: Secondary | ICD-10-CM | POA: Diagnosis not present

## 2020-12-27 DIAGNOSIS — Z794 Long term (current) use of insulin: Secondary | ICD-10-CM | POA: Diagnosis not present

## 2020-12-28 DIAGNOSIS — R899 Unspecified abnormal finding in specimens from other organs, systems and tissues: Secondary | ICD-10-CM | POA: Diagnosis not present

## 2020-12-28 DIAGNOSIS — R197 Diarrhea, unspecified: Secondary | ICD-10-CM | POA: Diagnosis not present

## 2020-12-29 ENCOUNTER — Other Ambulatory Visit: Payer: Self-pay

## 2021-01-01 NOTE — Telephone Encounter (Signed)
Numbers look fine, no need to forward

## 2021-01-02 ENCOUNTER — Other Ambulatory Visit: Payer: Self-pay | Admitting: Primary Care

## 2021-01-02 DIAGNOSIS — E1142 Type 2 diabetes mellitus with diabetic polyneuropathy: Secondary | ICD-10-CM

## 2021-01-02 NOTE — Telephone Encounter (Signed)
How much gabapentin is he actually taking? During our last visit we decided for him to take 300 mg in AM, 300 mg in afternoon, and 600 mg HS.

## 2021-01-03 ENCOUNTER — Ambulatory Visit: Payer: Medicare Other | Admitting: Cardiovascular Disease

## 2021-01-03 MED ORDER — GABAPENTIN 600 MG PO TABS: 1200.0000 mg | ORAL_TABLET | Freq: Every day | ORAL | 1 refills | Status: DC

## 2021-01-03 NOTE — Telephone Encounter (Signed)
Changed Rx to state 1200 mg HS, also sent to [facility] per request.

## 2021-01-03 NOTE — Telephone Encounter (Signed)
Called patient is taking 1200mg  at night only. No other doses during the day. He would like refill called into [facility]

## 2021-01-05 DIAGNOSIS — R197 Diarrhea, unspecified: Secondary | ICD-10-CM | POA: Diagnosis not present

## 2021-01-05 DIAGNOSIS — R899 Unspecified abnormal finding in specimens from other organs, systems and tissues: Secondary | ICD-10-CM | POA: Diagnosis not present

## 2021-01-09 DIAGNOSIS — M25562 Pain in left knee: Secondary | ICD-10-CM

## 2021-01-09 DIAGNOSIS — M545 Low back pain, unspecified: Secondary | ICD-10-CM

## 2021-01-09 DIAGNOSIS — G8929 Other chronic pain: Secondary | ICD-10-CM

## 2021-01-13 DIAGNOSIS — I5022 Chronic systolic (congestive) heart failure: Secondary | ICD-10-CM | POA: Diagnosis not present

## 2021-01-18 DIAGNOSIS — I25118 Atherosclerotic heart disease of native coronary artery with other forms of angina pectoris: Secondary | ICD-10-CM | POA: Diagnosis not present

## 2021-01-18 DIAGNOSIS — I10 Essential (primary) hypertension: Secondary | ICD-10-CM | POA: Diagnosis not present

## 2021-01-18 DIAGNOSIS — E1142 Type 2 diabetes mellitus with diabetic polyneuropathy: Secondary | ICD-10-CM | POA: Diagnosis not present

## 2021-01-18 DIAGNOSIS — I5022 Chronic systolic (congestive) heart failure: Secondary | ICD-10-CM | POA: Diagnosis not present

## 2021-01-24 NOTE — Addendum Note (Signed)
Addended by: Pleas Koch on: 01/24/2021 04:58 PM   Modules accepted: Orders

## 2021-02-06 DIAGNOSIS — E118 Type 2 diabetes mellitus with unspecified complications: Secondary | ICD-10-CM | POA: Diagnosis not present

## 2021-02-06 DIAGNOSIS — Z794 Long term (current) use of insulin: Secondary | ICD-10-CM | POA: Diagnosis not present

## 2021-02-06 DIAGNOSIS — G4730 Sleep apnea, unspecified: Secondary | ICD-10-CM | POA: Diagnosis not present

## 2021-02-07 ENCOUNTER — Ambulatory Visit (INDEPENDENT_AMBULATORY_CARE_PROVIDER_SITE_OTHER): Payer: Medicare Other

## 2021-02-07 DIAGNOSIS — M170 Bilateral primary osteoarthritis of knee: Secondary | ICD-10-CM | POA: Diagnosis not present

## 2021-02-07 DIAGNOSIS — I255 Ischemic cardiomyopathy: Secondary | ICD-10-CM | POA: Diagnosis not present

## 2021-02-07 LAB — CUP PACEART REMOTE DEVICE CHECK
Battery Remaining Longevity: 46 mo
Battery Remaining Percentage: 49 %
Battery Voltage: 2.92 V
Brady Statistic AP VP Percent: 1 %
Brady Statistic AP VS Percent: 1 %
Brady Statistic AS VP Percent: 13 %
Brady Statistic AS VS Percent: 86 %
Brady Statistic RA Percent Paced: 1 %
Brady Statistic RV Percent Paced: 13 %
Date Time Interrogation Session: 20220524025215
HighPow Impedance: 41 Ohm
HighPow Impedance: 41 Ohm
Implantable Lead Implant Date: 20160929
Implantable Lead Implant Date: 20160929
Implantable Lead Location: 753859
Implantable Lead Location: 753860
Implantable Lead Model: 7071
Implantable Pulse Generator Implant Date: 20160929
Lead Channel Impedance Value: 350 Ohm
Lead Channel Impedance Value: 380 Ohm
Lead Channel Pacing Threshold Amplitude: 0.75 V
Lead Channel Pacing Threshold Amplitude: 1 V
Lead Channel Pacing Threshold Pulse Width: 0.4 ms
Lead Channel Pacing Threshold Pulse Width: 0.5 ms
Lead Channel Sensing Intrinsic Amplitude: 3.4 mV
Lead Channel Sensing Intrinsic Amplitude: 6.9 mV
Lead Channel Setting Pacing Amplitude: 2 V
Lead Channel Setting Pacing Amplitude: 2.5 V
Lead Channel Setting Pacing Pulse Width: 0.5 ms
Lead Channel Setting Sensing Sensitivity: 0.5 mV
Pulse Gen Serial Number: 7306375

## 2021-02-07 NOTE — Telephone Encounter (Signed)
Brian Williams or Brian Williams, can you check into his pain management referral and respond back?

## 2021-02-24 DIAGNOSIS — Z6835 Body mass index (BMI) 35.0-35.9, adult: Secondary | ICD-10-CM | POA: Diagnosis not present

## 2021-02-24 DIAGNOSIS — I5022 Chronic systolic (congestive) heart failure: Secondary | ICD-10-CM | POA: Diagnosis not present

## 2021-02-24 DIAGNOSIS — I110 Hypertensive heart disease with heart failure: Secondary | ICD-10-CM | POA: Diagnosis not present

## 2021-02-24 DIAGNOSIS — Z7982 Long term (current) use of aspirin: Secondary | ICD-10-CM | POA: Diagnosis not present

## 2021-02-24 DIAGNOSIS — E785 Hyperlipidemia, unspecified: Secondary | ICD-10-CM | POA: Diagnosis not present

## 2021-02-24 DIAGNOSIS — I509 Heart failure, unspecified: Secondary | ICD-10-CM | POA: Diagnosis not present

## 2021-02-24 DIAGNOSIS — E11649 Type 2 diabetes mellitus with hypoglycemia without coma: Secondary | ICD-10-CM | POA: Diagnosis not present

## 2021-02-24 DIAGNOSIS — Z9641 Presence of insulin pump (external) (internal): Secondary | ICD-10-CM | POA: Diagnosis not present

## 2021-02-24 DIAGNOSIS — I255 Ischemic cardiomyopathy: Secondary | ICD-10-CM | POA: Diagnosis not present

## 2021-02-24 DIAGNOSIS — E1165 Type 2 diabetes mellitus with hyperglycemia: Secondary | ICD-10-CM | POA: Diagnosis not present

## 2021-02-24 DIAGNOSIS — Z794 Long term (current) use of insulin: Secondary | ICD-10-CM | POA: Diagnosis not present

## 2021-02-24 DIAGNOSIS — E669 Obesity, unspecified: Secondary | ICD-10-CM | POA: Diagnosis not present

## 2021-02-24 DIAGNOSIS — E1140 Type 2 diabetes mellitus with diabetic neuropathy, unspecified: Secondary | ICD-10-CM | POA: Diagnosis not present

## 2021-02-24 DIAGNOSIS — I2510 Atherosclerotic heart disease of native coronary artery without angina pectoris: Secondary | ICD-10-CM | POA: Diagnosis not present

## 2021-02-24 LAB — HEMOGLOBIN A1C: Hemoglobin A1C: 6.5

## 2021-02-27 DIAGNOSIS — E1142 Type 2 diabetes mellitus with diabetic polyneuropathy: Secondary | ICD-10-CM

## 2021-02-28 MED ORDER — GABAPENTIN 600 MG PO TABS: 1200.0000 mg | ORAL_TABLET | Freq: Two times a day (BID) | ORAL | 0 refills | Status: DC

## 2021-03-01 NOTE — Progress Notes (Signed)
Remote ICD transmission.   

## 2021-03-14 DIAGNOSIS — E669 Obesity, unspecified: Secondary | ICD-10-CM | POA: Diagnosis not present

## 2021-03-14 DIAGNOSIS — Z95810 Presence of automatic (implantable) cardiac defibrillator: Secondary | ICD-10-CM | POA: Diagnosis not present

## 2021-03-14 DIAGNOSIS — E1142 Type 2 diabetes mellitus with diabetic polyneuropathy: Secondary | ICD-10-CM | POA: Diagnosis not present

## 2021-03-14 DIAGNOSIS — E782 Mixed hyperlipidemia: Secondary | ICD-10-CM | POA: Diagnosis not present

## 2021-03-14 DIAGNOSIS — I255 Ischemic cardiomyopathy: Secondary | ICD-10-CM | POA: Diagnosis not present

## 2021-03-14 DIAGNOSIS — I25118 Atherosclerotic heart disease of native coronary artery with other forms of angina pectoris: Secondary | ICD-10-CM | POA: Diagnosis not present

## 2021-03-14 DIAGNOSIS — Z6834 Body mass index (BMI) 34.0-34.9, adult: Secondary | ICD-10-CM | POA: Diagnosis not present

## 2021-03-14 DIAGNOSIS — I5022 Chronic systolic (congestive) heart failure: Secondary | ICD-10-CM | POA: Diagnosis not present

## 2021-03-14 DIAGNOSIS — I10 Essential (primary) hypertension: Secondary | ICD-10-CM | POA: Diagnosis not present

## 2021-03-14 DIAGNOSIS — G4733 Obstructive sleep apnea (adult) (pediatric): Secondary | ICD-10-CM | POA: Diagnosis not present

## 2021-03-14 DIAGNOSIS — Z794 Long term (current) use of insulin: Secondary | ICD-10-CM | POA: Diagnosis not present

## 2021-03-14 LAB — COMPREHENSIVE METABOLIC PANEL
Albumin: 4.5 (ref 3.5–5.0)
Calcium: 10.6 (ref 8.7–10.7)

## 2021-03-14 LAB — HEPATIC FUNCTION PANEL
ALT: 19 (ref 10–40)
AST: 14 (ref 14–40)
Alkaline Phosphatase: 51 (ref 25–125)
Bilirubin, Total: 0.2

## 2021-03-14 LAB — BASIC METABOLIC PANEL
BUN: 20 (ref 4–21)
CO2: 28 — AB (ref 13–22)
Chloride: 103 (ref 99–108)
Creatinine: 1 (ref <=1.3)
Glucose: 143
Potassium: 4.5 (ref 3.4–5.3)
Sodium: 137 (ref 137–147)

## 2021-03-15 DIAGNOSIS — M170 Bilateral primary osteoarthritis of knee: Secondary | ICD-10-CM | POA: Diagnosis not present

## 2021-03-20 NOTE — Progress Notes (Signed)
Cardiology Office Note  Date:  03/21/2021   ID:  Brian Williams, DOB 07-25-1961, MRN [id number]  PCP:  Pleas Koch, NP   Chief Complaint  Patient presents with   3 month follow up     "Doing well." Medications reviewed by the patient verbally.     HPI:  Brian Williams is a pleasant 60 year old gentleman with history of  coronary artery disease,  long history of smoking,  diabetes type 2 poorly controlled with  hemoglobin A1c of 9, followed by endocrine/Dr. Gabriel Carina, cardiac catheterization 12 dating back to 2002,  ischemic cardiomyopathy,  echocardiogram March 2016 showing ejection fraction 20-25%,   sustained VT, VT ablation in [place] at [facility] sleep apnea, on CPAP, generator change in 2016, ICD in place,   previously seen by seen by [facility] cardiology heart failure/transplant team, [name].  EF 35% in 2017 Last cath 06/2019 EF 20 to 25%  who presents for routine follow-up of his coronary artery disease, CABG, September 2017  Overall doing very well on todays visit Followed by [facility] cardiology, notes reviewed Coreg to metoprolol On vascepa Lasix 80/40 increased Farxiga/jardiance: yeast infection in groin  Weight down another 10 pounds in 3-4 months  No edema, breathing stable  Lab work reviewed A1C 6.5, much improved, results discussed with him in detail Cr 1.02 Total chol at goal  Prior hospitalizations reviewed In the ER, admitted 12/08/2020 Acute on chronic diarrhea:  dehydration: Metabolic acidosis GI pathogen positive for norovirus. Rifaximin TID for 14 day, too expensive Changed to bactrim (Lasix had been cut back to 40 daily post d/c)  gabapentin 600 TID by neurology  For neck pain 12/01/2020  EKG personally reviewed by myself on todays visit Shows normal sinus rhythm rate 66 bpm, old anterior MI, nonspecific ST ABN  Other past medical history reviewed Cath at 06/2019 at [facility] . There is Significant 3-vessel coronary artery disease including 60%   stenosis of distal LMCA, complete occlusion of the mLAD & mRCA and 80-90%  stenosis of OM2 & OM3.  2. Patent LIMA-mLAD, VG-rPDA and VG-OM2. VG-D known to be occluded.  3. Normal right and left ventricular filling pressures.  4. Normal cardiac output.   CABG By Dr. Cyndia Bent September 2017 Left internal mammary graft to the LAD SVG to diagonal, SVG to OM, SVG to PDA  cath on 05/10/2016 showing a 70% eccentric distal LM stenosis, 80% proximal LAD in-stent restenosis, 80% diagonal stenosis, and tandem 90% proximal and mid RCA stenoses with mild in-stent narrowing. The LVEF is visually 35-45%   Previously seen in [place] for his cardiac issues. History dates back to 2002 when he had distal RCA disease. Several catheterizations over the next several years for in-stent restenosis of the distal RCA. Later cath, stent placed to the LAD. Most recent catheterization appears to be April 2011 showing patent LAD and RCA stent.    PMH:   has a past medical history of AICD (automatic cardioverter/defibrillator) present, Anxiety, Arthritis, Back pain, Cardiomyopathy ([place]), CHF (congestive heart failure) ([place]), Coronary artery disease, Depression, Diabetes mellitus without complication ([place]), GERD (gastroesophageal reflux disease), Headache, History of bronchitis, History of colon polyps, History of hiatal hernia, Hyperlipidemia, Hypertension, MI (myocardial infarction) ([place]) (2001), Obesity, Peripheral neuropathy, Pneumonia, PONV (postoperative nausea and vomiting), Presence of permanent cardiac pacemaker, Shortness of breath dyspnea, Sleep apnea, and Ventricular tachycardia ([place]).  PSH:    Past Surgical History:  Procedure Laterality Date   BACK SURGERY     CARDIAC CATHETERIZATION  CARDIAC CATHETERIZATION Left 05/10/2016   Procedure: Left Heart Cath and Coronary Angiography;  Surgeon: Minna Merritts, MD;  Location: [facility];  Service: Cardiovascular;  Laterality: Left;   CARDIAC  DEFIBRILLATOR PLACEMENT  10/16/2007   ICD Model number 2207-36 serial number [id number]   CARDIAC ELECTROPHYSIOLOGY STUDY AND ABLATION     CHOLECYSTECTOMY  2001   COLONOSCOPY     COLONOSCOPY WITH PROPOFOL N/A 08/28/2018   Procedure: COLONOSCOPY WITH PROPOFOL;  Surgeon: Jonathon Bellows, MD;  Location: [facility] ENDOSCOPY;  Service: Gastroenterology;  Laterality: N/A;   CORONARY ANGIOPLASTY WITH STENT PLACEMENT     7 stents   CORONARY ARTERY BYPASS GRAFT N/A 05/24/2016   Procedure: CORONARY ARTERY BYPASS GRAFTING (CABG) x four , using left internal mammary artery and left leg greater saphenous vein harvested endoscopically;  Surgeon: Gaye Pollack, MD;  Location: [facility] OR;  Service: Open Heart Surgery;  Laterality: N/A;   EP IMPLANTABLE DEVICE N/A 06/16/2015   Procedure: ICD Generator Changeout;  Surgeon: Deboraha Sprang, MD;  Location: [facility];  Service: Cardiovascular;  Laterality: N/A;   ESOPHAGOGASTRODUODENOSCOPY     INSERT / REPLACE / REMOVE PACEMAKER     KNEE SURGERY     bilateral knee    [name] WITHOUT CARDIOVERSION N/A 05/24/2016   Procedure: TRANSESOPHAGEAL ECHOCARDIOGRAM ([name]);  Surgeon: Gaye Pollack, MD;  Location: [place];  Service: Open Heart Surgery;  Laterality: N/A;   VASECTOMY      Current Outpatient Medications  Medication Sig Dispense Refill   aspirin EC 81 MG tablet Take 81 mg by mouth daily.  90 tablet    clopidogrel (PLAVIX) 75 MG tablet TAKE ONE TABLET BY MOUTH DAILY 90 tablet 1   escitalopram (LEXAPRO) 20 MG tablet TAKE 1 TABLET BY MOUTH  DAILY FOR ANXIETY AND  DEPRESSION 90 tablet 3   ezetimibe (ZETIA) 10 MG tablet TAKE 1 TABLET BY MOUTH  DAILY 90 tablet 2   furosemide (LASIX) 40 MG tablet Take 80 mg in the am & 40 mg in the pm.     gabapentin (NEURONTIN) 600 MG tablet Take 2 tablets (1,200 mg total) by mouth 2 (two) times daily. For pain. 360 tablet 0   gemfibrozil (LOPID) 600 MG tablet TAKE 1 TABLET BY MOUTH  DAILY 90 tablet 3   insulin glargine (LANTUS) 100 UNIT/ML  injection Inject 0.2 mLs (20 Units total) into the skin daily. 10 mL 11   metoprolol succinate (TOPROL-XL) 50 MG 24 hr tablet Take 50 mg by mouth daily.     nitroGLYCERIN (NITROSTAT) 0.4 MG SL tablet Place 0.4 mg under the tongue every 5 (five) minutes as needed.     Omega-3 Fatty Acids (FISH OIL) 1000 MG CAPS Take 1,000 mg by mouth daily.     rosuvastatin (CRESTOR) 20 MG tablet TAKE 1 TABLET BY MOUTH AT  BEDTIME FOR CHOLESTEROL 90 tablet 3   sacubitril-valsartan (ENTRESTO) 97-103 MG Take 1 tablet by mouth 2 (two) times daily.     Semaglutide, 1 MG/DOSE, (OZEMPIC, 1 MG/DOSE,) 4 MG/3ML SOPN Inject 1 mg into the skin once a week.     sildenafil (REVATIO) 20 MG tablet Take 2 to 5 tablets by mouth 1 hour prior to intercourse as needed. 50 tablet 0   spironolactone (ALDACTONE) 25 MG tablet Take 12.5 mg by mouth daily.     traZODone (DESYREL) 100 MG tablet TAKE 2 TABLETS BY MOUTH  EVERY NIGHT AT BEDTIME 180 tablet 3   No current facility-administered medications  for this visit.     Allergies:   Naproxen and Morphine and related   Social History:  The patient  reports that he quit smoking about 2 years ago. His smoking use included cigarettes. He has a 25.00 pack-year smoking history. He has never used smokeless tobacco. He reports that he does not drink alcohol and does not use drugs.   Family History:   family history includes Heart attack (age of onset: 29) in his mother; Heart attack (age of onset: 64) in his father; Heart disease in his maternal grandmother and maternal uncle; Hypertension in his father, maternal uncle, and mother; Melanoma in his mother; Stroke in his maternal grandmother.    Review of Systems: Review of Systems  Constitutional: Negative.   HENT: Negative.    Respiratory: Negative.    Cardiovascular: Negative.   Gastrointestinal: Negative.   Musculoskeletal: Negative.        Leg pain  Neurological: Negative.   Psychiatric/Behavioral: Negative.    All other systems  reviewed and are negative.  PHYSICAL EXAM: VS:  BP (!) 90/50 (BP Location: Left Arm, Patient Position: Sitting, Cuff Size: Normal)   Pulse 66   Ht 6' (1.829 m)   Wt 255 lb 4 oz (115.8 kg)   SpO2 97%   BMI 34.62 kg/m  , BMI Body mass index is 34.62 kg/m. Constitutional:  oriented to person, place, and time. No distress.  HENT:  Head: Grossly normal Eyes:  no discharge. No scleral icterus.  Neck: No JVD, no carotid bruits  Cardiovascular: Regular rate and rhythm, no murmurs appreciated Pulmonary/Chest: Clear to auscultation bilaterally, no wheezes or rails Abdominal: Soft.  no distension.  no tenderness.  Musculoskeletal: Normal range of motion Neurological:  normal muscle tone. Coordination normal. No atrophy Skin: Skin warm and dry Psychiatric: normal affect, pleasant   Recent Labs: 12/07/2020: ALT 29; Magnesium 2.5; TSH 3.804 12/08/2020: BUN 49; Creatinine, Ser 1.03; Hemoglobin 8.6; Platelets 125; Potassium 4.8; Sodium 139    Lipid Panel Lab Results  Component Value Date   CHOL 125 07/22/2020   HDL 32.10 (L) 07/22/2020   LDLCALC 65 07/22/2020   TRIG 139.0 07/22/2020      Wt Readings from Last 3 Encounters:  03/21/21 255 lb 4 oz (115.8 kg)  12/12/20 264 lb (119.7 kg)  12/08/20 259 lb 9.6 oz (117.8 kg)     ASSESSMENT AND PLAN:  Congestive dilated cardiomyopathy ([place]) - Plan: EKG 12-Lead Low blood pressure but asymptomatic No medication changes, limitation secondary to hypotension Unable to tolerate jardiance/farxiga (tried 2-3 times)  Chronic systolic heart failure (HCC) -  Ejection fraction 25% per outside records Reports asymptomatic, Medications as detailed above, no changes made Complemented him on weight loss and improved diabetes Recommended regular walking program  Diarrhea/norovirus Hospital admission several months ago, has made full recovery  Stable angina ([place]) Currently with no symptoms of angina. No further workup at this time. Continue  current medication regimen.  Morbid obesity with BMI of 40.0-44.9, adult (HCC) Modified his diet, weight down 50 pounds Changed his diet Numbers much improved  Hx of CABG Chronic stable anginal symptoms Currently with no symptoms of angina. No further workup at this time. Continue current medication regimen.  OSA on CPAP Recommended compliance with his CPAP  Pure hypercholesterolemia Continue Crestor and Zetia  Active smoker Current non-smoker  Diabetes type 2 with complications U3J excellent in comparison   Total encounter time more than 25 minutes  Greater than 50% was spent in counseling and coordination  of care with the patient    Orders Placed This Encounter  Procedures   EKG 12-Lead     Signed, Esmond Plants, M.D., Ph.D. 03/21/2021  [place], [place]

## 2021-03-21 ENCOUNTER — Ambulatory Visit: Payer: Medicare Other | Admitting: Cardiovascular Disease

## 2021-03-21 ENCOUNTER — Encounter: Payer: Self-pay | Admitting: Cardiovascular Disease

## 2021-03-21 ENCOUNTER — Other Ambulatory Visit: Payer: Self-pay

## 2021-03-21 VITALS — BP 90/50 | HR 66 | Ht 72.0 in | Wt 255.2 lb

## 2021-03-21 DIAGNOSIS — I10 Essential (primary) hypertension: Secondary | ICD-10-CM

## 2021-03-21 DIAGNOSIS — Z95810 Presence of automatic (implantable) cardiac defibrillator: Secondary | ICD-10-CM | POA: Diagnosis not present

## 2021-03-21 DIAGNOSIS — I255 Ischemic cardiomyopathy: Secondary | ICD-10-CM

## 2021-03-21 DIAGNOSIS — E1165 Type 2 diabetes mellitus with hyperglycemia: Secondary | ICD-10-CM

## 2021-03-21 DIAGNOSIS — I5022 Chronic systolic (congestive) heart failure: Secondary | ICD-10-CM

## 2021-03-21 DIAGNOSIS — E118 Type 2 diabetes mellitus with unspecified complications: Secondary | ICD-10-CM

## 2021-03-21 DIAGNOSIS — E1142 Type 2 diabetes mellitus with diabetic polyneuropathy: Secondary | ICD-10-CM

## 2021-03-21 DIAGNOSIS — G4733 Obstructive sleep apnea (adult) (pediatric): Secondary | ICD-10-CM

## 2021-03-21 DIAGNOSIS — I25118 Atherosclerotic heart disease of native coronary artery with other forms of angina pectoris: Secondary | ICD-10-CM

## 2021-03-21 DIAGNOSIS — IMO0002 Reserved for concepts with insufficient information to code with codable children: Secondary | ICD-10-CM

## 2021-03-21 DIAGNOSIS — Z9989 Dependence on other enabling machines and devices: Secondary | ICD-10-CM

## 2021-03-21 NOTE — Patient Instructions (Addendum)
Medication Instructions:  No changes, please continue your current medications   If you need a refill on your cardiac medications before your next appointment, please call your pharmacy.   Lab work: No new labs needed  Testing/Procedures: No new testing needed  Follow-Up: At [facility], you and your health needs are our priority.  As part of our continuing mission to provide you with exceptional heart care, we have created designated Provider Care Teams.  These Care Teams include your primary Cardiologist (physician) and Advanced Practice Providers (APPs -  Physician Assistants and Nurse Practitioners) who all work together to provide you with the care you need, when you need it.  You will need a follow up appointment in 6 months  Providers on your designated Care Team:   Murray Hodgkins, NP Christell Faith, PA-C Marrianne Mood, PA-C [place] [place], [place]  COVID-19 Vaccine Information can be found at: [email or web address].uk For questions related to vaccine distribution or appointments, please email [email or web address] or call [phone].

## 2021-03-29 DIAGNOSIS — I5022 Chronic systolic (congestive) heart failure: Secondary | ICD-10-CM | POA: Diagnosis not present

## 2021-03-31 ENCOUNTER — Telehealth: Payer: Self-pay | Admitting: Primary Care

## 2021-03-31 NOTE — Telephone Encounter (Signed)
Order was sent to [employer] in January and the CPAP was ordered according to the notes.  I checked with Vallarie Mare [facility] regarding him getting the CPAP now.  She advised that the patient should call [employer].  I called the patient and let him know that the CPAP machine had been ordered in January of this year and that the order is good for a year. Advised that he can call [employer] to see how to get the CPAP machine.  I provided him with the phone number to inquire.  I let him know to call us if he needs anything further.  He verbalized understanding.  Nothing further needed.

## 2021-04-03 ENCOUNTER — Ambulatory Visit: Payer: Self-pay

## 2021-04-03 ENCOUNTER — Emergency Department: Payer: Medicare Other

## 2021-04-03 ENCOUNTER — Ambulatory Visit
Admission: EM | Admit: 2021-04-03 | Discharge: 2021-04-03 | Disposition: A | Discharge status: Home / Self Care | Payer: Medicare Other | Attending: Emergency Medicine | Admitting: Emergency Medicine

## 2021-04-03 ENCOUNTER — Inpatient Hospital Stay
Admission: EM | Admit: 2021-04-03 | Discharge: 2021-04-06 | DRG: 372 | Disposition: A | Discharge status: Home / Self Care | Payer: Medicare Other | Attending: General Surgery | Admitting: General Surgery

## 2021-04-03 ENCOUNTER — Other Ambulatory Visit: Payer: Self-pay

## 2021-04-03 ENCOUNTER — Encounter: Payer: Self-pay | Admitting: Emergency Medicine

## 2021-04-03 DIAGNOSIS — Z20822 Contact with and (suspected) exposure to covid-19: Secondary | ICD-10-CM | POA: Diagnosis not present

## 2021-04-03 DIAGNOSIS — K760 Fatty (change of) liver, not elsewhere classified: Secondary | ICD-10-CM | POA: Diagnosis present

## 2021-04-03 DIAGNOSIS — K353 Acute appendicitis with localized peritonitis, without perforation or gangrene: Principal | ICD-10-CM

## 2021-04-03 DIAGNOSIS — I700 Atherosclerosis of aorta: Secondary | ICD-10-CM | POA: Diagnosis present

## 2021-04-03 DIAGNOSIS — E119 Type 2 diabetes mellitus without complications: Secondary | ICD-10-CM | POA: Diagnosis not present

## 2021-04-03 DIAGNOSIS — I10 Essential (primary) hypertension: Secondary | ICD-10-CM

## 2021-04-03 DIAGNOSIS — Z9989 Dependence on other enabling machines and devices: Secondary | ICD-10-CM

## 2021-04-03 DIAGNOSIS — R109 Unspecified abdominal pain: Secondary | ICD-10-CM | POA: Diagnosis not present

## 2021-04-03 DIAGNOSIS — Z888 Allergy status to other drugs, medicaments and biological substances status: Secondary | ICD-10-CM

## 2021-04-03 DIAGNOSIS — F32A Depression, unspecified: Secondary | ICD-10-CM | POA: Diagnosis present

## 2021-04-03 DIAGNOSIS — E1165 Type 2 diabetes mellitus with hyperglycemia: Secondary | ICD-10-CM | POA: Diagnosis not present

## 2021-04-03 DIAGNOSIS — I2510 Atherosclerotic heart disease of native coronary artery without angina pectoris: Secondary | ICD-10-CM | POA: Diagnosis present

## 2021-04-03 DIAGNOSIS — I110 Hypertensive heart disease with heart failure: Secondary | ICD-10-CM | POA: Diagnosis not present

## 2021-04-03 DIAGNOSIS — Z808 Family history of malignant neoplasm of other organs or systems: Secondary | ICD-10-CM

## 2021-04-03 DIAGNOSIS — I5022 Chronic systolic (congestive) heart failure: Secondary | ICD-10-CM | POA: Diagnosis present

## 2021-04-03 DIAGNOSIS — Z9049 Acquired absence of other specified parts of digestive tract: Secondary | ICD-10-CM

## 2021-04-03 DIAGNOSIS — I252 Old myocardial infarction: Secondary | ICD-10-CM | POA: Diagnosis not present

## 2021-04-03 DIAGNOSIS — E669 Obesity, unspecified: Secondary | ICD-10-CM | POA: Diagnosis not present

## 2021-04-03 DIAGNOSIS — D72829 Elevated white blood cell count, unspecified: Secondary | ICD-10-CM | POA: Diagnosis not present

## 2021-04-03 DIAGNOSIS — Z95810 Presence of automatic (implantable) cardiac defibrillator: Secondary | ICD-10-CM

## 2021-04-03 DIAGNOSIS — I440 Atrioventricular block, first degree: Secondary | ICD-10-CM | POA: Diagnosis present

## 2021-04-03 DIAGNOSIS — E876 Hypokalemia: Secondary | ICD-10-CM | POA: Diagnosis present

## 2021-04-03 DIAGNOSIS — Z7902 Long term (current) use of antithrombotics/antiplatelets: Secondary | ICD-10-CM | POA: Diagnosis not present

## 2021-04-03 DIAGNOSIS — K3580 Unspecified acute appendicitis: Secondary | ICD-10-CM | POA: Diagnosis present

## 2021-04-03 DIAGNOSIS — E1142 Type 2 diabetes mellitus with diabetic polyneuropathy: Secondary | ICD-10-CM | POA: Diagnosis present

## 2021-04-03 DIAGNOSIS — Z955 Presence of coronary angioplasty implant and graft: Secondary | ICD-10-CM

## 2021-04-03 DIAGNOSIS — I509 Heart failure, unspecified: Secondary | ICD-10-CM | POA: Diagnosis not present

## 2021-04-03 DIAGNOSIS — Z885 Allergy status to narcotic agent status: Secondary | ICD-10-CM

## 2021-04-03 DIAGNOSIS — G4733 Obstructive sleep apnea (adult) (pediatric): Secondary | ICD-10-CM | POA: Diagnosis not present

## 2021-04-03 DIAGNOSIS — Z951 Presence of aortocoronary bypass graft: Secondary | ICD-10-CM | POA: Diagnosis not present

## 2021-04-03 DIAGNOSIS — Z6835 Body mass index (BMI) 35.0-35.9, adult: Secondary | ICD-10-CM

## 2021-04-03 DIAGNOSIS — F331 Major depressive disorder, recurrent, moderate: Secondary | ICD-10-CM | POA: Diagnosis present

## 2021-04-03 DIAGNOSIS — R1031 Right lower quadrant pain: Secondary | ICD-10-CM

## 2021-04-03 DIAGNOSIS — E11649 Type 2 diabetes mellitus with hypoglycemia without coma: Secondary | ICD-10-CM | POA: Diagnosis not present

## 2021-04-03 DIAGNOSIS — F411 Generalized anxiety disorder: Secondary | ICD-10-CM | POA: Diagnosis present

## 2021-04-03 DIAGNOSIS — E785 Hyperlipidemia, unspecified: Secondary | ICD-10-CM | POA: Diagnosis not present

## 2021-04-03 DIAGNOSIS — Z794 Long term (current) use of insulin: Secondary | ICD-10-CM

## 2021-04-03 DIAGNOSIS — Z86010 Personal history of colonic polyps: Secondary | ICD-10-CM

## 2021-04-03 DIAGNOSIS — Z7982 Long term (current) use of aspirin: Secondary | ICD-10-CM

## 2021-04-03 DIAGNOSIS — E118 Type 2 diabetes mellitus with unspecified complications: Secondary | ICD-10-CM | POA: Diagnosis not present

## 2021-04-03 DIAGNOSIS — Z9641 Presence of insulin pump (external) (internal): Secondary | ICD-10-CM | POA: Diagnosis present

## 2021-04-03 DIAGNOSIS — N433 Hydrocele, unspecified: Secondary | ICD-10-CM | POA: Diagnosis present

## 2021-04-03 DIAGNOSIS — Z79899 Other long term (current) drug therapy: Secondary | ICD-10-CM

## 2021-04-03 DIAGNOSIS — Z8249 Family history of ischemic heart disease and other diseases of the circulatory system: Secondary | ICD-10-CM

## 2021-04-03 DIAGNOSIS — Z87891 Personal history of nicotine dependence: Secondary | ICD-10-CM

## 2021-04-03 DIAGNOSIS — Z823 Family history of stroke: Secondary | ICD-10-CM

## 2021-04-03 HISTORY — DX: Unspecified acute appendicitis: K35.80

## 2021-04-03 LAB — CBC
HCT: 34 % — ABNORMAL LOW (ref 39.0–52.0)
Hemoglobin: 12.1 g/dL — ABNORMAL LOW (ref 13.0–17.0)
MCH: 31.9 pg (ref 26.0–34.0)
MCHC: 35.6 g/dL (ref 30.0–36.0)
MCV: 89.7 fL (ref 80.0–100.0)
Platelets: 147 10*3/uL — ABNORMAL LOW (ref 150–400)
RBC: 3.79 MIL/uL — ABNORMAL LOW (ref 4.22–5.81)
RDW: 13.3 % (ref 11.5–15.5)
WBC: 10.9 10*3/uL — ABNORMAL HIGH (ref 4.0–10.5)
nRBC: 0 % (ref 0.0–0.2)

## 2021-04-03 LAB — URINALYSIS, COMPLETE (UACMP) WITH MICROSCOPIC
Bacteria, UA: NONE SEEN
Bilirubin Urine: NEGATIVE
Glucose, UA: 50 mg/dL — AB
Hgb urine dipstick: NEGATIVE
Ketones, ur: NEGATIVE mg/dL
Leukocytes,Ua: NEGATIVE
Nitrite: NEGATIVE
Protein, ur: NEGATIVE mg/dL
Specific Gravity, Urine: 1.019 (ref 1.005–1.030)
Squamous Epithelial / HPF: NONE SEEN (ref 0–5)
pH: 5 (ref 5.0–8.0)

## 2021-04-03 LAB — GLUCOSE, CAPILLARY: Glucose-Capillary: 206 mg/dL — ABNORMAL HIGH (ref 70–99)

## 2021-04-03 LAB — RESP PANEL BY RT-PCR (FLU A&B, COVID) ARPGX2
Influenza A by PCR: NEGATIVE
Influenza B by PCR: NEGATIVE
SARS Coronavirus 2 by RT PCR: NEGATIVE

## 2021-04-03 LAB — COMPREHENSIVE METABOLIC PANEL
ALT: 21 U/L (ref 0–44)
AST: 19 U/L (ref 15–41)
Albumin: 4.7 g/dL (ref 3.5–5.0)
Alkaline Phosphatase: 42 U/L (ref 38–126)
Anion gap: 12 (ref 5–15)
BUN: 23 mg/dL — ABNORMAL HIGH (ref 6–20)
CO2: 23 mmol/L (ref 22–32)
Calcium: 10.1 mg/dL (ref 8.9–10.3)
Chloride: 101 mmol/L (ref 98–111)
Creatinine, Ser: 0.99 mg/dL (ref 0.61–1.24)
GFR, Estimated: >60 mL/min (ref >60)
Glucose, Bld: 197 mg/dL — ABNORMAL HIGH (ref 70–99)
Potassium: 4 mmol/L (ref 3.5–5.1)
Sodium: 136 mmol/L (ref 135–145)
Total Bilirubin: 0.8 mg/dL (ref 0.3–1.2)
Total Protein: 7.9 g/dL (ref 6.5–8.1)

## 2021-04-03 LAB — POCT URINALYSIS DIP (MANUAL ENTRY)
Bilirubin, UA: NEGATIVE
Blood, UA: NEGATIVE
Glucose, UA: NEGATIVE mg/dL
Ketones, POC UA: NEGATIVE mg/dL
Leukocytes, UA: NEGATIVE
Nitrite, UA: NEGATIVE
Protein Ur, POC: NEGATIVE mg/dL
Spec Grav, UA: 1.02 (ref 1.010–1.025)
Urobilinogen, UA: 0.2 E.U./dL
pH, UA: 5.5 (ref 5.0–8.0)

## 2021-04-03 LAB — CBG MONITORING, ED: Glucose-Capillary: 157 mg/dL — ABNORMAL HIGH (ref 70–99)

## 2021-04-03 LAB — LIPASE, BLOOD: Lipase: 25 U/L (ref 11–51)

## 2021-04-03 LAB — BRAIN NATRIURETIC PEPTIDE: B Natriuretic Peptide: 56.9 pg/mL (ref 0.0–100.0)

## 2021-04-03 MED ORDER — INSULIN ASPART 100 UNIT/ML IJ SOLN: 0.0000 [IU] | Freq: Every day | INTRAMUSCULAR | Status: DC

## 2021-04-03 MED ORDER — FENTANYL CITRATE (PF) 100 MCG/2ML IJ SOLN
100.0000 ug | Freq: Once | INTRAMUSCULAR | Status: AC
  Administered 2021-04-03: 100 ug via INTRAVENOUS
  Filled 2021-04-03: qty 2

## 2021-04-03 MED ORDER — EZETIMIBE 10 MG PO TABS
10.0000 mg | ORAL_TABLET | Freq: Every day | ORAL | Status: DC
  Administered 2021-04-04 – 2021-04-06 (×3): 10 mg via ORAL
  Filled 2021-04-03 (×3): qty 1

## 2021-04-03 MED ORDER — ROSUVASTATIN CALCIUM 10 MG PO TABS
20.0000 mg | ORAL_TABLET | Freq: Every day | ORAL | Status: DC
  Filled 2021-04-03: qty 2

## 2021-04-03 MED ORDER — METOPROLOL SUCCINATE ER 50 MG PO TB24
50.0000 mg | ORAL_TABLET | Freq: Every day | ORAL | Status: DC
  Administered 2021-04-04 – 2021-04-06 (×3): 50 mg via ORAL
  Filled 2021-04-03 (×3): qty 1

## 2021-04-03 MED ORDER — ONDANSETRON HCL 4 MG/2ML IJ SOLN: 4.0000 mg | Freq: Four times a day (QID) | INTRAMUSCULAR | Status: DC | PRN

## 2021-04-03 MED ORDER — FUROSEMIDE 40 MG PO TABS
40.0000 mg | ORAL_TABLET | Freq: Two times a day (BID) | ORAL | Status: DC
  Administered 2021-04-04 – 2021-04-06 (×5): 40 mg via ORAL
  Filled 2021-04-03 (×5): qty 1

## 2021-04-03 MED ORDER — INSULIN ASPART 100 UNIT/ML IJ SOLN: 0.0000 [IU] | Freq: Three times a day (TID) | INTRAMUSCULAR | Status: DC

## 2021-04-03 MED ORDER — ROSUVASTATIN CALCIUM 10 MG PO TABS
20.0000 mg | ORAL_TABLET | Freq: Every day | ORAL | Status: DC
  Administered 2021-04-04 – 2021-04-06 (×3): 20 mg via ORAL
  Filled 2021-04-03 (×2): qty 2

## 2021-04-03 MED ORDER — SPIRONOLACTONE 25 MG PO TABS: 12.5000 mg | ORAL_TABLET | Freq: Every day | ORAL | Status: DC

## 2021-04-03 MED ORDER — GABAPENTIN 600 MG PO TABS
1200.0000 mg | ORAL_TABLET | Freq: Two times a day (BID) | ORAL | Status: DC
  Administered 2021-04-04 – 2021-04-06 (×6): 1200 mg via ORAL
  Filled 2021-04-03 (×6): qty 2

## 2021-04-03 MED ORDER — SACUBITRIL-VALSARTAN 97-103 MG PO TABS: 1.0000 | ORAL_TABLET | Freq: Two times a day (BID) | ORAL | Status: DC

## 2021-04-03 MED ORDER — HYDRALAZINE HCL 20 MG/ML IJ SOLN: 5.0000 mg | INTRAMUSCULAR | Status: DC | PRN

## 2021-04-03 MED ORDER — IOHEXOL 350 MG/ML SOLN
100.0000 mL | Freq: Once | INTRAVENOUS | Status: AC | PRN
  Administered 2021-04-03: 100 mL via INTRAVENOUS
  Filled 2021-04-03: qty 100

## 2021-04-03 MED ORDER — OMEGA-3-ACID ETHYL ESTERS 1 G PO CAPS
1.0000 g | ORAL_CAPSULE | Freq: Every day | ORAL | Status: DC
  Administered 2021-04-04 – 2021-04-06 (×3): 1 g via ORAL
  Filled 2021-04-03 (×3): qty 1

## 2021-04-03 MED ORDER — FENTANYL CITRATE (PF) 100 MCG/2ML IJ SOLN
50.0000 ug | Freq: Once | INTRAMUSCULAR | Status: AC
  Administered 2021-04-03: 50 ug via INTRAVENOUS
  Filled 2021-04-03: qty 2

## 2021-04-03 MED ORDER — INSULIN PUMP
Freq: Three times a day (TID) | SUBCUTANEOUS | Status: DC
  Filled 2021-04-03: qty 1

## 2021-04-03 MED ORDER — PIPERACILLIN-TAZOBACTAM 3.375 G IVPB
3.3750 g | Freq: Three times a day (TID) | INTRAVENOUS | Status: DC
  Administered 2021-04-03 – 2021-04-06 (×9): 3.375 g via INTRAVENOUS
  Filled 2021-04-03 (×8): qty 50

## 2021-04-03 MED ORDER — TRAZODONE HCL 100 MG PO TABS
100.0000 mg | ORAL_TABLET | Freq: Every day | ORAL | Status: DC
  Administered 2021-04-04 – 2021-04-05 (×3): 100 mg via ORAL
  Filled 2021-04-03 (×3): qty 1

## 2021-04-03 MED ORDER — ACETAMINOPHEN 325 MG PO TABS: 650.0000 mg | ORAL_TABLET | Freq: Four times a day (QID) | ORAL | Status: DC | PRN

## 2021-04-03 MED ORDER — ESCITALOPRAM OXALATE 20 MG PO TABS
20.0000 mg | ORAL_TABLET | Freq: Every day | ORAL | Status: DC
  Administered 2021-04-04 – 2021-04-06 (×3): 20 mg via ORAL
  Filled 2021-04-03 (×3): qty 1

## 2021-04-03 MED ORDER — HYDROMORPHONE HCL 1 MG/ML IJ SOLN
0.5000 mg | INTRAMUSCULAR | Status: DC | PRN
  Administered 2021-04-03 – 2021-04-04 (×3): 1 mg via INTRAVENOUS
  Filled 2021-04-03 (×3): qty 1

## 2021-04-03 MED ORDER — ONDANSETRON 4 MG PO TBDP
4.0000 mg | ORAL_TABLET | Freq: Four times a day (QID) | ORAL | Status: DC | PRN
  Administered 2021-04-03: 4 mg via ORAL
  Filled 2021-04-03: qty 1

## 2021-04-03 MED ORDER — NITROGLYCERIN 0.4 MG SL SUBL: 0.4000 mg | SUBLINGUAL_TABLET | SUBLINGUAL | Status: DC | PRN

## 2021-04-03 MED ORDER — SACUBITRIL-VALSARTAN 97-103 MG PO TABS
0.5000 | ORAL_TABLET | Freq: Two times a day (BID) | ORAL | Status: DC
  Administered 2021-04-03 – 2021-04-06 (×6): 0.5 via ORAL
  Filled 2021-04-03 (×7): qty 0.5

## 2021-04-03 MED ORDER — SODIUM CHLORIDE 0.9 % IV SOLN: INTRAVENOUS | Status: DC

## 2021-04-03 MED ORDER — FUROSEMIDE 40 MG PO TABS: 40.0000 mg | ORAL_TABLET | Freq: Two times a day (BID) | ORAL | Status: DC

## 2021-04-03 MED ORDER — GEMFIBROZIL 600 MG PO TABS
600.0000 mg | ORAL_TABLET | Freq: Every day | ORAL | Status: DC
  Administered 2021-04-04 – 2021-04-06 (×3): 600 mg via ORAL
  Filled 2021-04-03 (×3): qty 1

## 2021-04-03 MED ORDER — OXYCODONE-ACETAMINOPHEN 5-325 MG PO TABS: 1.0000 | ORAL_TABLET | ORAL | Status: DC | PRN

## 2021-04-03 NOTE — Progress Notes (Signed)
Pt refused CPAP. Stated he will have family bring his in on tomorrow. Pt aware that if he changes his mind at any time one will be provided for him.

## 2021-04-03 NOTE — ED Provider Notes (Signed)
Chief Complaint   Chief Complaint  Patient presents with   Abdominal Pain    RLQ     Subjective, HPI  Brian Williams is a very pleasant 60 y.o. male who presents with right lower quadrant abdominal pain which started 2 days ago.  Patient states that he was bending a lot while cleaning his son's room and thought that he had initially pulled a muscle.  Patient states that this pain is unlike the pain that he has had previously with pulled muscles.  Patient states that when he presses on the area it "sends me through the roof".  Patient states that the pain initially began to the mid lower abdominal region and localized shortly thereafter to the right lower quadrant.  Patient states initially he thought he may be constipated which might be causing his pain.  Patient states that pain is radiating all over his entire abdomen which is worse with movement to include walking or bumps in the road as he is driving.  Patient endorses that the pain is a stabbing sensation and "feels like a knife is being twisted in my stomach".  No fever, chills, vomiting, diarrhea, chest pain or shortness of breath today.  No known injury.  History obtained from patient.   Patient's problem list, past medical and social history, medications, and allergies were reviewed by me and updated in Epic.    ROS  See HPI.  Objective   Vitals:   04/03/21 1100  BP: (!) 144/73  Pulse: (!) 101  Resp: 20  Temp: 98.8 F (37.1 C)  SpO2: 97%    Vital signs and nursing note reviewed.   No LMP for male patient.   General: Appears well-developed and well-nourished. No acute distress.  HEENT Head: Normocephalic and atraumatic.  Eyes: Conjunctivae and EOM are normal. No eye drainage or scleral icterus bilaterally.  Ears: Hearing grossly intact, no drainage or visible deformity.  Nose: No nasal deviation or rhinorrhea.  Neck: Normal range of motion, neck is supple.  Cardiovascular: Mild tachycardia. Regular rhythm; no  murmurs, gallops, or rubs. 2+ distal pulses, < 2 sec capillary refill.  Pulm/Chest: No respiratory distress. Breath sounds normal bilaterally without wheezes, rhonchi, or rales.  Abdominal: Firm, distended, and tender with rebound and guarding. Bowel tones normotonic in all quadrants.  Firm area noted to the mid/right lower quadrant of abdomen.  No bulges. Musculoskeletal: No joint deformity, normal range of motion.  Neurological: Alert and oriented to person, place, and time.  Skin: Skin is warm and dry.  Psychiatric: Normal mood, affect, behavior, and thought content.   Assessment & Plan  1. Right lower quadrant abdominal pain  No orders of the defined types were placed in this encounter.    60 y.o. male presents with right lower quadrant abdominal pain which started 2 days ago.  Patient states that he was bending a lot while cleaning his son's room and thought that he had initially pulled a muscle.  Patient states that this pain is unlike the pain that he has had previously with pulled muscles.  Patient states that when he presses on the area it "sends me through the roof".  Patient states that the pain initially began to the mid lower abdominal region and localized shortly thereafter to the right lower quadrant.  Patient states initially he thought he may be constipated which might be causing his pain.  Patient states that pain is radiating all over his entire abdomen which is worse with movement to include  walking or bumps in the road as he is driving.  Patient endorses that the pain is a stabbing sensation and "feels like a knife is being twisted in my stomach".  No fever, chills, vomiting, diarrhea, chest pain or shortness of breath today.  No known injury.  Chart review completed.  Given symptoms along with assessment findings, concern for appendicitis.  Lower suspicion for incarcerated hernia as I do not feel any bulges.  There is a firm area to the mid to right lower quadrant of the abdomen  for which is consistent with scar tissue.  Patient sent to the emergency department for further work-up as I feel he would benefit from CT scanning to rule out appendicitis.  Patient stable on discharge and will be arriving via private vehicle.  Patient verbalized understanding and agreed with plan.  Plan:   Discharge Instructions      Your current condition warrants further evaluation and/or treatment which exceed services available to you in this urgent care setting. I have discussed with you your currrent condition and the need for further evaluation and/or treatment in an emergency department setting. In response to my medical recommendation, you have opted to go to the emergency department at: [place], FNP-C 04/03/21  This note was partially made with the aid of speech-to-text dictation; typographical errors are not intentional.    Serafina Royals, [facility] 04/03/21 1129

## 2021-04-03 NOTE — Plan of Care (Signed)
  Problem: Activity: Goal: Capacity to carry out activities will improve Outcome: Progressing   Problem: Cardiac: Goal: Ability to achieve and maintain adequate cardiopulmonary perfusion will improve Outcome: Progressing   Problem: Clinical Measurements: Goal: Will remain free from infection Outcome: Progressing   Problem: Coping: Goal: Level of anxiety will decrease Outcome: Progressing

## 2021-04-03 NOTE — ED Triage Notes (Signed)
Pt comes with c/o RLQ pain that started Saturday. Pt  states few episodes of diarrhea.

## 2021-04-03 NOTE — ED Provider Notes (Signed)
[facility] Emergency Department Provider Note  ____________________________________________   Event Date/Time   First MD Initiated Contact with Patient 04/03/21 1255     (approximate)  I have reviewed the triage vital signs and the nursing notes.   HISTORY  Chief Complaint RLQ pain   HPI Brian Williams is a 60 y.o. male with past medical history of fairly extensive cardiac disease including multiple bypasses on ASA and Plavix, CHF, GERD, arthritis, HTN, HDL, DM, OSA on CPAP at night, obesity, peripheral neuropathy and history of a cholecystectomy who presents for assessment of some right lower quadrant pain that started about 3 days ago.  Patient states he has had a couple episodes of loose stools but no significant nausea or vomiting or blood in his stool.  He denies any urinary symptoms.  States his pain is only in his right lower quadrant that has been fairly severe and not improved with Tylenol or ibuprofen.  He denies any new chest pain, cough, shortness of breath, headache, earache, sore throat, rash or extremity pain or back pain.  No prior similar episodes.  States he was referred to the ED from urgent care with concerns of poss appendicitis.  No other acute concerns at this time.  Denies significant EtOH use.         Past Medical History:  Diagnosis Date  . AICD (automatic cardioverter/defibrillator) present   . Anxiety    takes Xanax as needed  . Arthritis    back,knees,right shoulder  . Back pain   . Cardiomyopathy ([place])   . CHF (congestive heart failure) (HCC)    takes Lasix and Aldactone daily  . Coronary artery disease    takes Plavix daily  . Depression    takes Zoloft daily  . Diabetes mellitus without complication (HCC)    Humulin R and Farxiga daily.Fasting blood sugar runs 140  . GERD (gastroesophageal reflux disease)   . Headache   . History of bronchitis   . History of colon polyps    benign  . History of hiatal hernia   .  Hyperlipidemia    takes Fenofibrate,Crestor, and Zetia daily  . Hypertension    takes Entresto and Coreg daily  . MI (myocardial infarction) ([place]) 2001  . Obesity   . Peripheral neuropathy   . Pneumonia    history of-last time about 14 yrs ago  . PONV (postoperative nausea and vomiting)    after knee surgery 25 yrs ago b/p stayed elevated for a while  . Presence of permanent cardiac pacemaker   . Shortness of breath dyspnea    with exertion  . Sleep apnea    uses CPAP nightly  . Ventricular tachycardia ([place])    s/p RFCA PVCs 2013    Patient Active Problem List   Diagnosis Date Noted  . Diarrhea 12/06/2020  . Hypotension 12/06/2020  . Dehydration 12/06/2020  . Dizziness 11/23/2020  . Neck pain 11/21/2020  . Photophobia 11/21/2020  . Sinusitis, acute 08/08/2020  . Chronic knee pain 07/25/2020  . Penile irritation 10/14/2019  . Myalgia 01/28/2019  . Dry skin 07/08/2018  . Major depressive disorder 07/08/2017  . Erectile dysfunction 07/08/2017  . Chest pain with moderate risk for cardiac etiology 07/26/2016  . Hx of CABG 07/26/2016  . Coronary artery disease 05/24/2016  . Atherosclerosis of native coronary artery of native heart with stable angina pectoris ([place])   . History of coronary artery stent placement   . Unstable angina ([place]) 05/04/2016  .  Obesity (BMI 30-39.9) 04/06/2016  . Memory disturbance 01/06/2016  . OSA on CPAP 01/06/2016  . Diabetes, polyneuropathy ([place]) 01/06/2016  . Insomnia 01/06/2016  . Hidradenitis suppurativa 10/06/2015  . Preventative health care 10/06/2015  . Chronic back pain 05/02/2015  . Essential hypertension 05/02/2015  . Hyperlipidemia 05/02/2015  . Generalized anxiety disorder 05/02/2015  . ICD (implantable cardioverter-defibrillator) in place 04/20/2015  . Congestive dilated cardiomyopathy ([place]) 04/20/2015  . Diabetes mellitus type 2, uncontrolled, with complications ([place]) 82/42/3536  . Chronic systolic heart failure ([place]) 09/30/2014     Past Surgical History:  Procedure Laterality Date  . BACK SURGERY    . CARDIAC CATHETERIZATION    . CARDIAC CATHETERIZATION Left 05/10/2016   Procedure: Left Heart Cath and Coronary Angiography;  Surgeon: Minna Merritts, MD;  Location: [facility];  Service: Cardiovascular;  Laterality: Left;  . CARDIAC DEFIBRILLATOR PLACEMENT  10/16/2007   ICD Model number 2207-36 serial number [id number]  . CARDIAC ELECTROPHYSIOLOGY STUDY AND ABLATION    . CHOLECYSTECTOMY  2001  . COLONOSCOPY    . COLONOSCOPY WITH PROPOFOL N/A 08/28/2018   Procedure: COLONOSCOPY WITH PROPOFOL;  Surgeon: Jonathon Bellows, MD;  Location: [facility] ENDOSCOPY;  Service: Gastroenterology;  Laterality: N/A;  . CORONARY ANGIOPLASTY WITH STENT PLACEMENT     7 stents  . CORONARY ARTERY BYPASS GRAFT N/A 05/24/2016   Procedure: CORONARY ARTERY BYPASS GRAFTING (CABG) x four , using left internal mammary artery and left leg greater saphenous vein harvested endoscopically;  Surgeon: Gaye Pollack, MD;  Location: [facility];  Service: Open Heart Surgery;  Laterality: N/A;  . EP IMPLANTABLE DEVICE N/A 06/16/2015   Procedure: ICD Generator Changeout;  Surgeon: Deboraha Sprang, MD;  Location: [place];  Service: Cardiovascular;  Laterality: N/A;  . ESOPHAGOGASTRODUODENOSCOPY    . INSERT / REPLACE / REMOVE PACEMAKER    . KNEE SURGERY     bilateral knee   . [name] WITHOUT CARDIOVERSION N/A 05/24/2016   Procedure: TRANSESOPHAGEAL ECHOCARDIOGRAM ([name]);  Surgeon: Gaye Pollack, MD;  Location: [facility];  Service: Open Heart Surgery;  Laterality: N/A;  . VASECTOMY      Prior to Admission medications   Medication Sig Start Date End Date Taking? Authorizing Provider  aspirin EC 81 MG tablet Take 81 mg by mouth daily.  06/21/16   Minna Merritts, MD  clopidogrel (PLAVIX) 75 MG tablet TAKE ONE TABLET BY MOUTH DAILY 12/04/18   Minna Merritts, MD  escitalopram (LEXAPRO) 20 MG tablet TAKE 1 TABLET BY MOUTH  DAILY FOR ANXIETY AND  DEPRESSION  08/13/20   Pleas Koch, NP  ezetimibe (ZETIA) 10 MG tablet TAKE 1 TABLET BY MOUTH  DAILY 09/01/20   Minna Merritts, MD  furosemide (LASIX) 40 MG tablet Take 80 mg in the am & 40 mg in the pm.    [provider]  gabapentin (NEURONTIN) 600 MG tablet Take 2 tablets (1,200 mg total) by mouth 2 (two) times daily. For pain. 02/28/21   Pleas Koch, NP  gemfibrozil (LOPID) 600 MG tablet TAKE 1 TABLET BY MOUTH  DAILY 06/17/20   Pleas Koch, NP  insulin glargine (LANTUS) 100 UNIT/ML injection Inject 0.2 mLs (20 Units total) into the skin daily. 12/08/20   Regalado, Belkys A, MD  metoprolol succinate (TOPROL-XL) 50 MG 24 hr tablet Take 50 mg by mouth daily. 03/14/21   [provider]  nitroGLYCERIN (NITROSTAT) 0.4 MG SL tablet Place 0.4 mg under the tongue every 5 (  five) minutes as needed.    [provider]  Omega-3 Fatty Acids (FISH OIL) 1000 MG CAPS Take 1,000 mg by mouth daily. 03/14/21 06/12/21  [provider]  rosuvastatin (CRESTOR) 20 MG tablet TAKE 1 TABLET BY MOUTH AT  BEDTIME FOR CHOLESTEROL 06/17/20   Pleas Koch, NP  sacubitril-valsartan (ENTRESTO) 97-103 MG Take 1 tablet by mouth 2 (two) times daily. 01/18/21 01/18/22  [provider]  Semaglutide, 1 MG/DOSE, (OZEMPIC, 1 MG/DOSE,) 4 MG/3ML SOPN Inject 1 mg into the skin once a week. 11/09/20   [provider]  sildenafil (REVATIO) 20 MG tablet Take 2 to 5 tablets by mouth 1 hour prior to intercourse as needed. 11/15/20   Pleas Koch, NP  spironolactone (ALDACTONE) 25 MG tablet Take 12.5 mg by mouth daily. 03/15/21 04/14/21  [provider]  traZODone (DESYREL) 100 MG tablet TAKE 2 TABLETS BY MOUTH  EVERY NIGHT AT BEDTIME 06/17/20   Pleas Koch, NP  DULoxetine (CYMBALTA) 30 MG capsule Take 1 capsule by mouth for 7 days, then 2 capsules thereafter for depression. 07/24/19 09/30/19  Pleas Koch, NP    Allergies Naproxen and Morphine and  related  Family History  Problem Relation Age of Onset  . Heart attack Mother 55  . Hypertension Mother   . Melanoma Mother   . Heart attack Father 67  . Hypertension Father   . Hypertension Maternal Uncle   . Heart disease Maternal Uncle   . Heart disease Maternal Grandmother   . Stroke Maternal Grandmother   . Diabetes Neg Hx     Social History Social History   Tobacco Use  . Smoking status: Former    Packs/day: 1.00    Years: 25.00    Pack years: 25.00    Types: Cigarettes    Quit date: 05/19/2018    Years since quitting: 2.8  . Smokeless tobacco: Never  Vaping Use  . Vaping Use: Never used  Substance Use Topics  . Alcohol use: No    Comment: rare  . Drug use: No    Review of Systems  Review of Systems  Constitutional:  Negative for chills and fever.  HENT:  Negative for sore throat.   Eyes:  Negative for pain.  Respiratory:  Negative for cough and stridor.   Cardiovascular:  Negative for chest pain.  Gastrointestinal:  Positive for abdominal pain and diarrhea. Negative for vomiting.  Genitourinary:  Negative for dysuria.  Musculoskeletal:  Negative for myalgias.  Skin:  Negative for rash.  Neurological:  Negative for seizures, loss of consciousness and headaches.  Psychiatric/Behavioral:  Negative for suicidal ideas.   All other systems reviewed and are negative.    ____________________________________________   PHYSICAL EXAM:  VITAL SIGNS: ED Triage Vitals  Enc Vitals Group     BP 04/03/21 1141 (!) 135/98     Pulse Rate 04/03/21 1141 94     Resp 04/03/21 1141 19     Temp 04/03/21 1141 98.4 F (36.9 C)     Temp src --      SpO2 04/03/21 1141 97 %     Weight --      Height --      Head Circumference --      Peak Flow --      Pain Score 04/03/21 1140 10     Pain Loc --      Pain Edu? --      Excl. in [place]? --    Vitals:  04/03/21 1335 04/03/21 1506  BP: 117/68 104/60  Pulse: 80 92  Resp: 18 16  Temp:    SpO2: 97% 98%   Physical  Exam Vitals and nursing note reviewed.  Constitutional:      Appearance: He is well-developed. He is obese.  HENT:     Head: Normocephalic and atraumatic.     Right Ear: External ear normal.     Left Ear: External ear normal.     Nose: Nose normal.  Eyes:     Conjunctiva/sclera: Conjunctivae normal.  Cardiovascular:     Rate and Rhythm: Normal rate and regular rhythm.     Heart sounds: No murmur heard. Pulmonary:     Effort: Pulmonary effort is normal. No respiratory distress.     Breath sounds: Normal breath sounds.  Abdominal:     Palpations: Abdomen is soft.     Tenderness: There is abdominal tenderness in the right lower quadrant and suprapubic area. There is no right CVA tenderness or left CVA tenderness.  Musculoskeletal:     Cervical back: Neck supple.  Skin:    General: Skin is warm and dry.  Neurological:     Mental Status: He is alert and oriented to person, place, and time.  Psychiatric:        Mood and Affect: Mood normal.     ____________________________________________   LABS (all labs ordered are listed, but only abnormal results are displayed)  Labs Reviewed  COMPREHENSIVE METABOLIC PANEL - Abnormal; Notable for the following components:      Result Value   Glucose, Bld 197 (*)    BUN 23 (*)    All other components within normal limits  CBC - Abnormal; Notable for the following components:   WBC 10.9 (*)    RBC 3.79 (*)    Hemoglobin 12.1 (*)    HCT 34.0 (*)    Platelets 147 (*)    All other components within normal limits  URINALYSIS, COMPLETE (UACMP) WITH MICROSCOPIC - Abnormal; Notable for the following components:   Color, Urine YELLOW (*)    APPearance CLEAR (*)    Glucose, UA 50 (*)    All other components within normal limits  RESP PANEL BY RT-PCR (FLU A&B, COVID) [id number]  LIPASE, BLOOD   ____________________________________________  EKG  Sinus rhythm with first-degree AV block with a PR interval of 266, left axis deviation, otherwise  unremarkable intervals with some nonspecific findings but no other clearance of acute ischemia. ____________________________________________  RADIOLOGY  ED MD interpretation: CT abdomen pelvis remarkable for dilated appendix with some periappendiceal inflammatory changes consistent with acute uncomplicated appendicitis.  No evidence of perforation or abscess.  There is hepatic steatosis and a right hydrocele as well as aortic atherosclerosis no evidence of diverticulitis, kidney stone or other acute abdominal pelvic process.  Official radiology report(s): CT ABDOMEN PELVIS W CONTRAST  Result Date: 04/03/2021 CLINICAL DATA:  Right lower quadrant abdominal pain for 2 days EXAM: CT ABDOMEN AND PELVIS WITH CONTRAST TECHNIQUE: Multidetector CT imaging of the abdomen and pelvis was performed using the standard protocol following bolus administration of intravenous contrast. CONTRAST:  126mL OMNIPAQUE IOHEXOL 350 MG/ML SOLN COMPARISON:  12/06/2020 FINDINGS: Lower chest: No acute pleural or parenchymal lung disease. Hepatobiliary: Diffuse hepatic steatosis. Subcentimeter cysts are again identified within the liver. No intrahepatic duct dilation. The gallbladder is surgically absent. Pancreas: Unremarkable. No pancreatic ductal dilatation or surrounding inflammatory changes. Spleen: Normal in size without focal abnormality. Adrenals/Urinary Tract: Adrenal glands are unremarkable. Kidneys are  normal, without renal calculi, focal lesion, or hydronephrosis. Bladder is unremarkable. Stomach/Bowel: No bowel obstruction or ileus. Dilated appendix is seen within the right lower quadrant, measuring up to 10 mm in diameter. There is significant inflammatory changes within the surrounding mesentery, with small appendicoliths identified in the appendiceal lumen, consistent with acute uncomplicated appendicitis. No perforation, fluid collection, or abscess. Vascular/Lymphatic: Aortic atherosclerosis. No enlarged abdominal or  pelvic lymph nodes. Reproductive: Prostate is unremarkable. Right hydroceles again noted. Other: Trace free fluid within the right lower quadrant surrounding the inflamed appendix. No free intraperitoneal gas. No abdominal wall hernia. Musculoskeletal: No acute or destructive bony lesions. Reconstructed images demonstrate no additional findings. IMPRESSION: 1. Dilated appendix and periappendiceal inflammatory changes consistent with acute uncomplicated appendicitis. No perforation, fluid collection, or abscess. 2. Hepatic steatosis. 3. Right hydrocele. 4.  Aortic Atherosclerosis (ICD10-I70.0). Electronically Signed   By: Randa Ngo M.D.   On: 04/03/2021 15:01    ____________________________________________   PROCEDURES  Procedure(s) performed (including Critical Care):  .1-3 Lead EKG Interpretation  Date/Time: 04/03/2021 4:00 PM Performed by: Lucrezia Starch, MD Authorized by: Lucrezia Starch, MD     Interpretation: normal     ECG rate assessment: normal     Rhythm: sinus rhythm     Ectopy: none     Conduction: normal     ____________________________________________   INITIAL IMPRESSION / ASSESSMENT AND PLAN / ED COURSE     Presents with above-stated history exam for assessment approximately 3 days of right lower quadrant abdominal pain associate with some diarrhea referred from urgent care with concern for possible from ascites.  On arrival he was afebrile and hemodynamically stable.  He is quite tender in his right lower quadrant.  Primary differential includes appendicitis, diverticulitis, kidney stone, cystitis, partial SBO and hernia.   CT abdomen pelvis remarkable for dilated appendix with some periappendiceal inflammatory changes consistent with acute uncomplicated appendicitis.  No evidence of perforation or abscess.  There is hepatic steatosis and a right hydrocele as well as aortic atherosclerosis no evidence of diverticulitis, kidney stone or other acute abdominal  pelvic process.  Lipase not consistent with acute pancreatitis.  CMP shows glucose of 197 without any other significant electrolyte or metabolic derangements.  CBC shows WBC count of 10.9 with a hemoglobin of 12.1 and normal platelets.  UA is unremarkable.  Discussed concerns for acute appendicitis with on-call surgeon Dr. Celine Ahr who will have patient seen and admitted.  Advised patient of diagnosis of acute appendicitis and plan for admission.       ____________________________________________   FINAL CLINICAL IMPRESSION(S) / ED DIAGNOSES  Final diagnoses:  Acute appendicitis with localized peritonitis, without perforation, abscess, or gangrene    Medications  fentaNYL (SUBLIMAZE) injection 50 mcg (has no administration in time range)  fentaNYL (SUBLIMAZE) injection 100 mcg (100 mcg Intravenous Given 04/03/21 1334)  iohexol (OMNIPAQUE) 350 MG/ML injection 100 mL (100 mLs Intravenous Contrast Given 04/03/21 1421)  fentaNYL (SUBLIMAZE) injection 50 mcg (50 mcg Intravenous Given 04/03/21 1505)     ED Discharge Orders     None        Note:  This document was prepared using Dragon voice recognition software and may include unintentional dictation errors.    Lucrezia Starch, MD 04/03/21 1600

## 2021-04-03 NOTE — H&P (Addendum)
[facility] SURGICAL HISTORY & PHYSICAL (cpt [phone])  HISTORY OF PRESENT ILLNESS (HPI):  60 y.o. male presented to [facility] ED today for RLQ pain. Patient reports the acute onset of initially suprapubic abdominal pain. He thought at first this was constipation -vs- a pulled muscle but the pain was not improved with laxative nor tylenol/ibuprofen. The pain ultimately migrated to his RLQ. He described this as sharp and severe in nature. Nothing seemed to make this better. Movement made this worse. He denied any associated fever, chills, CP, SOB, nausea, emesis, or bowel changes. Only previous abdominal surgery is cholecystectomy in early 2000s. He does have a significant cardiac history including CABGx4 on ASA/Plavix, CHF with most recent EF 25%, OSA, HTN, HLD, DM. No history of similar in the past. Work up in the ED revealed a mild leukocytosis to 10.9K but remaining lab work up was reassuring. CT Abdomen/Pelvis was concerning for acute uncomplicated appendicitis.   General surgery is consulted by emergency medicine physician Dr Hulan Saas, MD for evaluation and management of acute appendicitis.   PAST MEDICAL HISTORY (PMH):  Past Medical History:  Diagnosis Date   AICD (automatic cardioverter/defibrillator) present    Anxiety    takes Xanax as needed   Arthritis    back,knees,right shoulder   Back pain    Cardiomyopathy ([place])    CHF (congestive heart failure) (HCC)    takes Lasix and Aldactone daily   Coronary artery disease    takes Plavix daily   Depression    takes Zoloft daily   Diabetes mellitus without complication (HCC)    Humulin R and Farxiga daily.Fasting blood sugar runs 140   GERD (gastroesophageal reflux disease)    Headache    History of bronchitis    History of colon polyps    benign   History of hiatal hernia    Hyperlipidemia    takes Fenofibrate,Crestor, and Zetia daily   Hypertension    takes Entresto and Coreg daily   MI (myocardial infarction)  ([place]) 2001   Obesity    Peripheral neuropathy    Pneumonia    history of-last time about 14 yrs ago   PONV (postoperative nausea and vomiting)    after knee surgery 25 yrs ago b/p stayed elevated for a while   Presence of permanent cardiac pacemaker    Shortness of breath dyspnea    with exertion   Sleep apnea    uses CPAP nightly   Ventricular tachycardia ([place])    s/p RFCA PVCs 2013    Reviewed. Otherwise negative.   PAST SURGICAL HISTORY [facility]):  Past Surgical History:  Procedure Laterality Date   BACK SURGERY     CARDIAC CATHETERIZATION     CARDIAC CATHETERIZATION Left 05/10/2016   Procedure: Left Heart Cath and Coronary Angiography;  Surgeon: Minna Merritts, MD;  Location: [place] [facility];  Service: Cardiovascular;  Laterality: Left;   CARDIAC DEFIBRILLATOR PLACEMENT  10/16/2007   ICD Model number 2207-36 serial number [id number]   CARDIAC ELECTROPHYSIOLOGY STUDY AND ABLATION     CHOLECYSTECTOMY  2001   COLONOSCOPY     COLONOSCOPY WITH PROPOFOL N/A 08/28/2018   Procedure: COLONOSCOPY WITH PROPOFOL;  Surgeon: Jonathon Bellows, MD;  Location: [facility] END[facility];  Service: Gastroenterology;  Laterality: N/A;   CORONARY ANGIOPLASTY WITH STENT PLACEMENT     7 stents   CORONARY ARTERY BYPASS GRAFT N/A 05/24/2016   Procedure: CORONARY ARTERY BYPASS GRAFTING (CABG) x four , using left internal mammary artery and left leg  greater saphenous vein harvested endoscopically;  Surgeon: Gaye Pollack, MD;  Location: [place] [facility];  Service: Open Heart Surgery;  Laterality: N/A;   EP IMPLANTABLE DEVICE N/A 06/16/2015   Procedure: ICD Generator Changeout;  Surgeon: Deboraha Sprang, MD;  Location: [place] [facility];  Service: Cardiovascular;  Laterality: N/A;   ESOPHAGOGASTRODUODENOSCOPY     INSERT / REPLACE / REMOVE PACEMAKER     KNEE SURGERY     bilateral knee    [name] WITHOUT CARDIOVERSION N/A 05/24/2016   Procedure: TRANSESOPHAGEAL ECHOCARDIOGRAM ([name]);  Surgeon: Gaye Pollack, MD;  Location: [place];   Service: Open Heart Surgery;  Laterality: N/A;   VASECTOMY      Reviewed. Otherwise negative.   MEDICATIONS:  Prior to Admission medications   Medication Sig Start Date End Date Taking? Authorizing Provider  aspirin EC 81 MG tablet Take 81 mg by mouth daily.  06/21/16   Minna Merritts, MD  clopidogrel (PLAVIX) 75 MG tablet TAKE ONE TABLET BY MOUTH DAILY 12/04/18   Minna Merritts, MD  escitalopram (LEXAPRO) 20 MG tablet TAKE 1 TABLET BY MOUTH  DAILY FOR ANXIETY AND  DEPRESSION 08/13/20   Pleas Koch, NP  ezetimibe (ZETIA) 10 MG tablet TAKE 1 TABLET BY MOUTH  DAILY 09/01/20   Minna Merritts, MD  furosemide (LASIX) 40 MG tablet Take 80 mg in the am & 40 mg in the pm.    [provider]  gabapentin (NEURONTIN) 600 MG tablet Take 2 tablets (1,200 mg total) by mouth 2 (two) times daily. For pain. 02/28/21   Pleas Koch, NP  gemfibrozil (LOPID) 600 MG tablet TAKE 1 TABLET BY MOUTH  DAILY 06/17/20   Pleas Koch, NP  insulin glargine (LANTUS) 100 UNIT/ML injection Inject 0.2 mLs (20 Units total) into the skin daily. 12/08/20   Regalado, Belkys A, MD  metoprolol succinate (TOPROL-XL) 50 MG 24 hr tablet Take 50 mg by mouth daily. 03/14/21   [provider]  nitroGLYCERIN (NITROSTAT) 0.4 MG SL tablet Place 0.4 mg under the tongue every 5 (five) minutes as needed.    [provider]  Omega-3 Fatty Acids (FISH OIL) 1000 MG CAPS Take 1,000 mg by mouth daily. 03/14/21 06/12/21  [provider]  rosuvastatin (CRESTOR) 20 MG tablet TAKE 1 TABLET BY MOUTH AT  BEDTIME FOR CHOLESTEROL 06/17/20   Pleas Koch, NP  sacubitril-valsartan (ENTRESTO) 97-103 MG Take 1 tablet by mouth 2 (two) times daily. 01/18/21 01/18/22  [provider]  Semaglutide, 1 MG/DOSE, (OZEMPIC, 1 MG/DOSE,) 4 MG/3ML SOPN Inject 1 mg into the skin once a week. 11/09/20   [provider]  sildenafil (REVATIO) 20 MG tablet Take 2 to 5 tablets by mouth 1 hour prior to  intercourse as needed. 11/15/20   Pleas Koch, NP  spironolactone (ALDACTONE) 25 MG tablet Take 12.5 mg by mouth daily. 03/15/21 04/14/21  [provider]  traZODone (DESYREL) 100 MG tablet TAKE 2 TABLETS BY MOUTH  EVERY NIGHT AT BEDTIME 06/17/20   Pleas Koch, NP  DULoxetine (CYMBALTA) 30 MG capsule Take 1 capsule by mouth for 7 days, then 2 capsules thereafter for depression. 07/24/19 09/30/19  Pleas Koch, NP     ALLERGIES:  Allergies  Allergen Reactions   Naproxen Other (See Comments), Anxiety and Rash    Causes hyperactivity causes hyperactivity   Morphine And Related Nausea And Vomiting    Morphine only     SOCIAL HISTORY:  Social History  Socioeconomic History   Marital status: Married    Spouse name: Not on file   Number of children: Not on file   Years of education: Not on file   Highest education level: Not on file  Occupational History   Not on file  Tobacco Use   Smoking status: Former    Packs/day: 1.00    Years: 25.00    Pack years: 25.00    Types: Cigarettes    Quit date: 05/19/2018    Years since quitting: 2.8   Smokeless tobacco: Never  Vaping Use   Vaping Use: Never used  Substance and Sexual Activity   Alcohol use: No    Comment: rare   Drug use: No   Sexual activity: Not Currently  Other Topics Concern   Not on file  Social History Narrative   Married.   Moved from [place].   Disabled.   Social Determinants of Health   Financial Resource Strain: Not on file  Food Insecurity: Not on file  Transportation Needs: Not on file  Physical Activity: Not on file  Stress: Not on file  Social Connections: Not on file  Intimate Partner Violence: Not on file     FAMILY HISTORY:  Family History  Problem Relation Age of Onset   Heart attack Mother 21   Hypertension Mother    Melanoma Mother    Heart attack Father 69   Hypertension Father    Hypertension Maternal Uncle    Heart disease Maternal Uncle    Heart disease  Maternal Grandmother    Stroke Maternal Grandmother    Diabetes Neg Hx     Otherwise negative.   REVIEW OF SYSTEMS:  Review of Systems  Constitutional:  Negative for chills and fever.  HENT:  Negative for congestion and sore throat.   Respiratory:  Negative for cough and shortness of breath.   Cardiovascular:  Negative for chest pain and palpitations.  Gastrointestinal:  Positive for abdominal pain. Negative for constipation, diarrhea, nausea and vomiting.  Genitourinary:  Negative for dysuria and urgency.  All other systems reviewed and are negative.  VITAL SIGNS:  Temp:  [98.4 F (36.9 C)-98.8 F (37.1 C)] 98.4 F (36.9 C) (07/18 1141) Pulse Rate:  [80-101] 88 (07/18 1600) Resp:  [15-20] 15 (07/18 1600) BP: (104-144)/(60-98) 111/62 (07/18 1600) SpO2:  [95 %-98 %] 96 % (07/18 1600)             PHYSICAL EXAM:  Physical Exam Vitals and nursing note reviewed. Exam conducted with a chaperone present.  Constitutional:      Appearance: Normal appearance. He is obese.     Comments: Patient resting in bed, wife at bedside   HENT:     Head: Normocephalic and atraumatic.  Eyes:     General: No scleral icterus.    Conjunctiva/sclera: Conjunctivae normal.  Cardiovascular:     Rate and Rhythm: Normal rate.     Pulses: Normal pulses.  Pulmonary:     Effort: Pulmonary effort is normal. No respiratory distress.  Chest:     Comments: Sternotomy scar present  Abdominal:     General: Abdomen is protuberant. A surgical scar is present. There is no distension.     Palpations: Abdomen is soft.     Tenderness: There is abdominal tenderness in the right lower quadrant. There is no guarding or rebound. Positive signs include McBurney's sign.     Comments: Abdomen is protuberant consistent with degree of obesity, soft, tenderness in RLQ with positive McBurney's  point, non-distended, no rebound/guarding. Previous laparoscopic scars seen   Genitourinary:    Comments: Deferred Musculoskeletal:      Right lower leg: No edema.     Left lower leg: No edema.  Skin:    General: Skin is warm and dry.     Coloration: Skin is not jaundiced.     Findings: No erythema.  Neurological:     General: No focal deficit present.     Mental Status: He is alert and oriented to person, place, and time.  Psychiatric:        Mood and Affect: Mood normal.        Behavior: Behavior normal.    INTAKE/OUTPUT:  This shift: No intake/output data recorded.  Last 2 shifts: @IOLAST2SHIFTS @  Labs:  CBC Latest Ref Rng & Units 04/03/2021 12/08/2020 12/07/2020  WBC 4.0 - 10.5 K/uL 10.9(H) 4.5 11.6(H)  Hemoglobin 13.0 - 17.0 g/dL 12.1(L) 8.6(L) 10.1(L)  Hematocrit 39.0 - 52.0 % 34.0(L) 24.3(L) 28.9(L)  Platelets 150 - 400 K/uL 147(L) 125(L) 160   CMP Latest Ref Rng & Units 04/03/2021 03/14/2021 12/08/2020  Glucose 70 - 99 mg/dL 197(H) - 166(H)  BUN 6 - 20 mg/dL 23(H) 20 49(H)  Creatinine 0.61 - 1.24 mg/dL 0.99 1.0 1.03  Sodium 135 - 145 mmol/L 136 137 139  Potassium 3.5 - 5.1 mmol/L 4.0 4.5 4.8  Chloride 98 - 111 mmol/L 101 103 116(H)  CO2 22 - 32 mmol/L 23 28(A) 17(L)  Calcium 8.9 - 10.3 mg/dL 10.1 10.6 9.1  Total Protein 6.5 - 8.1 g/dL 7.9 - -  Total Bilirubin 0.3 - 1.2 mg/dL 0.8 - -  Alkaline Phos 38 - 126 U/L 42 51 -  AST 15 - 41 U/L 19 14 -  ALT 0 - 44 U/L 21 19 -    Imaging studies:   CT Abdomen/Pelvis (04/03/2021) personally reviewed which shows significant inflammation in the RLQ surround appendix concerning for appendicitis without evidence of abscess or gross pneumoperitoneum, and radiologist report reviewed below:   IMPRESSION: 1. Dilated appendix and periappendiceal inflammatory changes consistent with acute uncomplicated appendicitis. No perforation, fluid collection, or abscess. 2. Hepatic steatosis. 3. Right hydrocele. 4.  Aortic Atherosclerosis (ICD10-I70.0).   Assessment/Plan: (ICD-10's: K35.80) 60 y.o. male with leukocytosis and RLQ abdominal pain concerning for acute  uncomplicated appendicitis, complicated by pertinent comorbidities including significant cardiac history including CAD s/p CABGx4 on Plavix/ASA, CHF with EF 25%, HTN.    - I had a long discussion at bedside with the patient and his wife (>30 minutes) regarding his disease process, work up, and concomitant comorbid conditions. The patient himself expressed concern with undergoing anesthesia given his cardiac history. I do feel that in the setting of uncomplicated appendicitis without overt signs of sepsis/decompensation, he is at increased surgical risk given his significant cardiac comorbidities and current Plavix use. We will proceed with conservative management in attempts to forgo surgical intervention in the acute setting with the hopes of getting him optimized as an outpatient for interval appendectomy, typically in about 8 weeks. He did express some interest in pursuing this at tertiary facility if felt necessary as his CHF MD is at [facility]. I did review the risks of this plan, including but not limited to, risk of further decompensation, need for more emergent intervention, need for additional procedures (ex: percutaneous drainage), and potential of recurrence in the future. However, I do feel the risks of surgery at the moment are far greater. He and his wife understand that should he  fail conservative measures, we would need to proceed more urgently with surgical intervention. Again, I spent a lengthy amount of time with him and his wife reviewing the above. He is rightfully very anxious regarding his disease process and his cardiac history. I did my best to provide him reassurance and make sure they understood our thought process and rationale. I believe at the end of our discussion all questions and concerns were answered.    - Admit to general surgery - IV Abx (Zosyn) - NPO + Gentle IVF in setting of CHF - Closely monitor abdominal examination; vitals - Pain control prn; antiemetics prn - Monitor  leukocytosis; morning labs - I have asked medicine to help with comorbid conditions; home medications; SSI - greatly appreciate help    - DVT prophylaxis; will hold in case he becomes in need of procedure   All of the above findings and recommendations were discussed with the patient and his wife, and all of their questions were answered to their expressed satisfaction.  -- Edison Simon, PA-C [facility] 04/03/2021, 4:27 PM [phone] M-F: 7am - 4pm   I saw and evaluated the patient.  I agree with the above documentation, exam, and plan, which I have edited where appropriate. Fredirick Maudlin  1:47 PM

## 2021-04-03 NOTE — Consult Note (Signed)
Medical Consultation   Brian Williams  HAL:[id number]  DOB: 26-Nov-1960  DOA: 04/03/2021  PCP: Pleas Koch, NP   Outpatient Specialists:    Requesting physician: -Hulan Saas and Dr. Celine Ahr of sugery  Reason for consultation: -management of multiple chronic medical problems   History of Present Illness: Brian Williams is an 60 y.o. male with PMH of hypertension, hyperlipidemia, diabetes mellitus, GERD, depression with anxiety, OSA on CPAP, V. tach, AICD placement, CAD, stent placement, CABG, sCHF with EF 35%, neuropathy, hiatal hernia, who presents with right lower quadrant abdominal pain.  Patient states that his abdominal pain has been going on for 2 days, which is located in the right lower quadrant, sharp, stabbing pain, 10 out of 10 severity at home, currently 7 out of 10 in severity, radiating to the back and crossing his whole abdomen.  Patient denies nausea, vomiting, diarrhea.  No fever or chills.  Patient states that when his abdominal pain was severe, he had palpitation which has resolved currently.  No chest pain.  Denies cough, fever or chills.  No symptoms of UTI.  Patient does not have leg edema.  ED course: Patient was found to have WBC 10.9, lipase 25, pending COVID-19 PCR, electrolytes renal function okay, temperature normal, blood pressure 111/62, heart rate of 1, 88, RR 20, oxygen saturation 95% on room air.  CT abdomen/pelvis that showed noncomplicated appendicitis.  EKG: Sinus rhythm, QTC 447, LAD, early R wave progression, first-degree AV block, ST depression in V3-V6.  Review of Systems:   General: no fevers, chills, no changes in body weight Skin: no rash HEENT: no blurry vision, hearing changes or sore throat Pulm: no dyspnea, coughing, wheezing CV: no chest pain, has palpitations, no shortness of breath Abd: no nausea/vomiting, has abdominal pain, no diarrhea/constipation GU: no dysuria, hematuria, polyuria Ext: no arthralgias,  myalgias Neuro: no weakness, numbness, or tingling    Past Medical History: Past Medical History:  Diagnosis Date   AICD (automatic cardioverter/defibrillator) present    Anxiety    takes Xanax as needed   Arthritis    back,knees,right shoulder   Back pain    Cardiomyopathy ([place])    CHF (congestive heart failure) (HCC)    takes Lasix and Aldactone daily   Coronary artery disease    takes Plavix daily   Depression    takes Zoloft daily   Diabetes mellitus without complication (HCC)    Humulin R and Farxiga daily.Fasting blood sugar runs 140   GERD (gastroesophageal reflux disease)    Headache    History of bronchitis    History of colon polyps    benign   History of hiatal hernia    Hyperlipidemia    takes Fenofibrate,Crestor, and Zetia daily   Hypertension    takes Entresto and Coreg daily   MI (myocardial infarction) ([place]) 2001   Obesity    Peripheral neuropathy    Pneumonia    history of-last time about 14 yrs ago   PONV (postoperative nausea and vomiting)    after knee surgery 25 yrs ago b/p stayed elevated for a while   Presence of permanent cardiac pacemaker    Shortness of breath dyspnea    with exertion   Sleep apnea    uses CPAP nightly   Ventricular tachycardia ([place])    s/p RFCA PVCs 2013    Past Surgical History: Past Surgical History:  Procedure Laterality  Date   BACK SURGERY     CARDIAC CATHETERIZATION     CARDIAC CATHETERIZATION Left 05/10/2016   Procedure: Left Heart Cath and Coronary Angiography;  Surgeon: Minna Merritts, MD;  Location: [facility];  Service: Cardiovascular;  Laterality: Left;   CARDIAC DEFIBRILLATOR PLACEMENT  10/16/2007   ICD Model number 2207-36 serial number [id number]   CARDIAC ELECTROPHYSIOLOGY STUDY AND ABLATION     CHOLECYSTECTOMY  2001   COLONOSCOPY     COLONOSCOPY WITH PROPOFOL N/A 08/28/2018   Procedure: COLONOSCOPY WITH PROPOFOL;  Surgeon: Jonathon Bellows, MD;  Location: [facility];  Service:  Gastroenterology;  Laterality: N/A;   CORONARY ANGIOPLASTY WITH STENT PLACEMENT     7 stents   CORONARY ARTERY BYPASS GRAFT N/A 05/24/2016   Procedure: CORONARY ARTERY BYPASS GRAFTING (CABG) x four , using left internal mammary artery and left leg greater saphenous vein harvested endoscopically;  Surgeon: Gaye Pollack, MD;  Location: [facility];  Service: Open Heart Surgery;  Laterality: N/A;   EP IMPLANTABLE DEVICE N/A 06/16/2015   Procedure: ICD Generator Changeout;  Surgeon: Deboraha Sprang, MD;  Location: [facility];  Service: Cardiovascular;  Laterality: N/A;   ESOPHAGOGASTRODUODENOSCOPY     INSERT / REPLACE / REMOVE PACEMAKER     KNEE SURGERY     bilateral knee    [name] WITHOUT CARDIOVERSION N/A 05/24/2016   Procedure: TRANSESOPHAGEAL ECHOCARDIOGRAM ([name]);  Surgeon: Gaye Pollack, MD;  Location: [facility];  Service: Open Heart Surgery;  Laterality: N/A;   VASECTOMY       Allergies:   Allergies  Allergen Reactions   Naproxen Other (See Comments), Anxiety and Rash    Causes hyperactivity causes hyperactivity   Morphine And Related Nausea And Vomiting    Morphine only     Social History:  reports that he quit smoking about 2 years ago. His smoking use included cigarettes. He has a 25.00 pack-year smoking history. He has never used smokeless tobacco. He reports that he does not drink alcohol and does not use drugs.   Family History: Family History  Problem Relation Age of Onset   Heart attack Mother 27   Hypertension Mother    Melanoma Mother    Heart attack Father 59   Hypertension Father    Hypertension Maternal Uncle    Heart disease Maternal Uncle    Heart disease Maternal Grandmother    Stroke Maternal Grandmother    Diabetes Neg Hx     Physical Exam: Vitals:   04/03/21 1335 04/03/21 1506 04/03/21 1530 04/03/21 1600  BP: 117/68 104/60 106/62 111/62  Pulse: 80 92 89 88  Resp: 18 16  15   Temp:      SpO2: 97% 98% 95% 96%   General: Not in acute distress HEENT:  PERRL, EOMI, no scleral icterus, No JVD or bruit Cardiac: S1/S2, RRR, No murmurs, gallops or rubs Pulm: No rales, wheezing, rhonchi or rubs. Abd: Soft, nondistended, has tenderness in right lower quadrant, no rebound pain, no organomegaly, BS present Ext: No edema. 2+DP/PT pulse bilaterally Musculoskeletal: No joint deformities, erythema, or stiffness, ROM full Skin: No rashes.  Neuro: Alert and oriented X3, cranial nerves II-XII grossly intact.  Moves all extremities normally.  Psych: Patient is not psychotic, no suicidal or hemocidal ideation.   Data reviewed:  I have personally reviewed following labs and imaging studies Labs:  CBC: Recent Labs  Lab 04/03/21 1139  WBC 10.9*  HGB 12.1*  HCT 34.0*  MCV 89.7  PLT 147*    Basic Metabolic Panel: Recent Labs  Lab 04/03/21 1139  NA 136  K 4.0  CL 101  CO2 23  GLUCOSE 197*  BUN 23*  CREATININE 0.99  CALCIUM 10.1   GFR Estimated Creatinine Clearance: 104.3 mL/min (by C-G formula based on SCr of 0.99 mg/dL). Liver Function Tests: Recent Labs  Lab 04/03/21 1139  AST 19  ALT 21  ALKPHOS 42  BILITOT 0.8  PROT 7.9  ALBUMIN 4.7   Recent Labs  Lab 04/03/21 1139  LIPASE 25   No results for input(s): AMMONIA in the last 168 hours. Coagulation profile No results for input(s): INR, PROTIME in the last 168 hours.  Cardiac Enzymes: No results for input(s): CKTOTAL, CKMB, CKMBINDEX, TROPONINI in the last 168 hours. BNP: Invalid input(s): POCBNP CBG: Recent Labs  Lab 04/03/21 1731  GLUCAP 157*   D-Dimer No results for input(s): DDIMER in the last 72 hours. Hgb A1c No results for input(s): HGBA1C in the last 72 hours. Lipid Profile No results for input(s): CHOL, HDL, LDLCALC, TRIG, CHOLHDL, LDLDIRECT in the last 72 hours. Thyroid function studies No results for input(s): TSH, T4TOTAL, T3FREE, THYROIDAB in the last 72 hours.  Invalid input(s): FREET3 Anemia work up No results for input(s): VITAMINB12, FOLATE,  FERRITIN, TIBC, IRON, RETICCTPCT in the last 72 hours. Urinalysis    Component Value Date/Time   COLORURINE YELLOW (A) 04/03/2021 1457   APPEARANCEUR CLEAR (A) 04/03/2021 1457   LABSPEC 1.019 04/03/2021 1457   PHURINE 5.0 04/03/2021 1457   GLUCOSEU 50 (A) 04/03/2021 1457   HGBUR NEGATIVE 04/03/2021 1457   BILIRUBINUR NEGATIVE 04/03/2021 1457   BILIRUBINUR negative 04/03/2021 1110   BILIRUBINUR Negative 10/14/2019 1025   KETONESUR NEGATIVE 04/03/2021 1457   KETONESUR negative 04/03/2021 1110   PROTEINUR NEGATIVE 04/03/2021 1457   PROTEINUR negative 04/03/2021 1110   UROBILINOGEN 0.2 04/03/2021 1110   NITRITE NEGATIVE 04/03/2021 1457   NITRITE Negative 04/03/2021 1110   LEUKOCYTESUR NEGATIVE 04/03/2021 1457   LEUKOCYTESUR Negative 04/03/2021 1110     Microbiology Recent Results (from the past 240 hour(s))  Resp Panel by RT-PCR (Flu A&B, Covid) Nasopharyngeal Swab     Status: None   Collection Time: 04/03/21  3:46 PM   Specimen: Nasopharyngeal Swab; Nasopharyngeal(NP) swabs in vial transport medium  Result Value Ref Range Status   SARS Coronavirus 2 by RT PCR NEGATIVE NEGATIVE Final    Comment: (NOTE) SARS-CoV-2 target nucleic acids are NOT DETECTED.  The SARS-CoV-2 RNA is generally detectable in upper respiratory specimens during the acute phase of infection. The lowest concentration of SARS-CoV-2 viral copies this assay can detect is 138 copies/mL. A negative result does not preclude SARS-Cov-2 infection and should not be used as the sole basis for treatment or other patient management decisions. A negative result may occur with  improper specimen collection/handling, submission of specimen other than nasopharyngeal swab, presence of viral mutation(s) within the areas targeted by this assay, and inadequate number of viral copies(<138 copies/mL). A negative result must be combined with clinical observations, patient history, and epidemiological information. The expected  result is Negative.  Fact Sheet for Patients:  EntrepreneurPulse.com.au  Fact Sheet for Healthcare Providers:  IncredibleEmployment.be  This test is no t yet approved or cleared by the [place] FDA and  has been authorized for detection and/or diagnosis of SARS-CoV-2 by FDA under an Emergency Use Authorization (EUA). This EUA will remain  in effect (meaning this test can be used) for the duration of the  COVID-19 declaration under Section 564(b)(1) of the Act, 21 U.S.C.section 360bbb-3(b)(1), unless the authorization is terminated  or revoked sooner.       Influenza A by PCR NEGATIVE NEGATIVE Final   Influenza B by PCR NEGATIVE NEGATIVE Final    Comment: (NOTE) The Xpert Xpress SARS-CoV-2/FLU/RSV plus assay is intended as an aid in the diagnosis of influenza from Nasopharyngeal swab specimens and should not be used as a sole basis for treatment. Nasal washings and aspirates are unacceptable for Xpert Xpress SARS-CoV-2/FLU/RSV testing.  Fact Sheet for Patients: EntrepreneurPulse.com.au  Fact Sheet for Healthcare Providers: IncredibleEmployment.be  This test is not yet approved or cleared by the [place] FDA and has been authorized for detection and/or diagnosis of SARS-CoV-2 by FDA under an Emergency Use Authorization (EUA). This EUA will remain in effect (meaning this test can be used) for the duration of the COVID-19 declaration under Section 564(b)(1) of the Act, 21 U.S.C. section 360bbb-3(b)(1), unless the authorization is terminated or revoked.  Performed at [facility], [address] [place], [place] [address]        Inpatient Medications:   Scheduled Meds:  escitalopram  20 mg Oral Daily   ezetimibe  10 mg Oral Daily   [START ON 04/04/2021] furosemide  40 mg Oral BID   gemfibrozil  600 mg Oral Daily   insulin pump   Subcutaneous TID WC, HS, 0200   metoprolol  succinate  50 mg Oral Daily   rosuvastatin  20 mg Oral Daily   sacubitril-valsartan  0.5 tablet Oral BID   traZODone  100 mg Oral QHS   Continuous Infusions:  sodium chloride     piperacillin-tazobactam (ZOSYN)  IV       Radiological Exams on Admission: CT ABDOMEN PELVIS W CONTRAST  Result Date: 04/03/2021 CLINICAL DATA:  Right lower quadrant abdominal pain for 2 days EXAM: CT ABDOMEN AND PELVIS WITH CONTRAST TECHNIQUE: Multidetector CT imaging of the abdomen and pelvis was performed using the standard protocol following bolus administration of intravenous contrast. CONTRAST:  140mL OMNIPAQUE IOHEXOL 350 MG/ML SOLN COMPARISON:  12/06/2020 FINDINGS: Lower chest: No acute pleural or parenchymal lung disease. Hepatobiliary: Diffuse hepatic steatosis. Subcentimeter cysts are again identified within the liver. No intrahepatic duct dilation. The gallbladder is surgically absent. Pancreas: Unremarkable. No pancreatic ductal dilatation or surrounding inflammatory changes. Spleen: Normal in size without focal abnormality. Adrenals/Urinary Tract: Adrenal glands are unremarkable. Kidneys are normal, without renal calculi, focal lesion, or hydronephrosis. Bladder is unremarkable. Stomach/Bowel: No bowel obstruction or ileus. Dilated appendix is seen within the right lower quadrant, measuring up to 10 mm in diameter. There is significant inflammatory changes within the surrounding mesentery, with small appendicoliths identified in the appendiceal lumen, consistent with acute uncomplicated appendicitis. No perforation, fluid collection, or abscess. Vascular/Lymphatic: Aortic atherosclerosis. No enlarged abdominal or pelvic lymph nodes. Reproductive: Prostate is unremarkable. Right hydroceles again noted. Other: Trace free fluid within the right lower quadrant surrounding the inflamed appendix. No free intraperitoneal gas. No abdominal wall hernia. Musculoskeletal: No acute or destructive bony lesions. Reconstructed  images demonstrate no additional findings. IMPRESSION: 1. Dilated appendix and periappendiceal inflammatory changes consistent with acute uncomplicated appendicitis. No perforation, fluid collection, or abscess. 2. Hepatic steatosis. 3. Right hydrocele. 4.  Aortic Atherosclerosis (ICD10-I70.0). Electronically Signed   By: Randa Ngo M.D.   On: 04/03/2021 15:01    Impression/Recommendations Principal Problem:   Acute appendicitis Active Problems:   Diabetes mellitus type 2, uncontrolled, with complications ([place])   Essential hypertension  Hyperlipidemia   Chronic systolic heart failure (HCC)   OSA on CPAP   Coronary artery disease   Depression   Acute appendicitis: CT abdomen/pelvis that showed uncomplicated appendicitis.  Patient has mild leukocytosis with WBC 10.9, but no fever.  Does not meet criteria for sepsis.  -Patient was started on Zosyn by primary surgery team -As needed Dilaudid and Percocet for pain -As needed Zofran -IV fluid: 50 cc/h for normal saline (patient has EF 35%, need to be careful when giving IV fluid) -Get blood culture -N.p.o. tonight for surgery  Diabetes mellitus type 2, uncontrolled, with complications ([place]): Z6X 6.5 recently, well controlled. -Continue insulin pump (his wife promised to bring in insulin pump)  Essential hypertension -IV hydralazine as needed -Patient is on Entresto, metoprolol, Lasix  Hyperlipidemia -Crestor, Lopid, Zetia  Chronic systolic heart failure ([place]): 2D echo on 05/24/2016 showed EF 35%.  Patient does not have leg edema.  No shortness of breath.  CHF is compensated. -Check BNP -Continue Entresto -Will hold Lasix tonight while patient is on n.p.o. and restart Lasix tomorrow  OSA -on CPAP  Coronary artery disease: S/p of CABG and stent placement.  No chest pain -Aspirin and Plavix are on hold by surgeon in case patient needed surgery -Continue metoprolol, Crestor, Lopid, Zetia -As needed  nitroglycerin  Depression -Lexapro       Thank you for this consultation.  Our [facility] hospitalist team will follow the patient with you.   Time Spent: 40 min  Ivor Costa M.D. [facility] 04/03/2021, 5:37 PM

## 2021-04-03 NOTE — ED Notes (Signed)
BLOOD CULTURE SETS COLLECTED AT 1747 AND 1750 - ZOSYN HAD NOT BEEN INITIATED PRIOR TO BOTH BLOOD CULTURE SETS BEING COLLECTED - CHARTED IN ERROR

## 2021-04-03 NOTE — Discharge Instructions (Signed)
Your current condition warrants further evaluation and/or treatment which exceed services available to you in this urgent care setting. I have discussed with you your currrent condition and the need for further evaluation and/or treatment in an emergency department setting. In response to my medical recommendation, you have opted to go to the emergency department at: [facility]

## 2021-04-03 NOTE — ED Triage Notes (Signed)
Pt presents today with right lower quadrant pain x 2 days. Denies injury. Denies n/v/d.

## 2021-04-04 DIAGNOSIS — K3580 Unspecified acute appendicitis: Secondary | ICD-10-CM

## 2021-04-04 DIAGNOSIS — I2510 Atherosclerotic heart disease of native coronary artery without angina pectoris: Secondary | ICD-10-CM

## 2021-04-04 LAB — GLUCOSE, CAPILLARY
Glucose-Capillary: 111 mg/dL — ABNORMAL HIGH (ref 70–99)
Glucose-Capillary: 114 mg/dL — ABNORMAL HIGH (ref 70–99)
Glucose-Capillary: 116 mg/dL — ABNORMAL HIGH (ref 70–99)
Glucose-Capillary: 130 mg/dL — ABNORMAL HIGH (ref 70–99)
Glucose-Capillary: 60 mg/dL — ABNORMAL LOW (ref 70–99)
Glucose-Capillary: 69 mg/dL — ABNORMAL LOW (ref 70–99)
Glucose-Capillary: 80 mg/dL (ref 70–99)

## 2021-04-04 LAB — CBC
HCT: 30.1 % — ABNORMAL LOW (ref 39.0–52.0)
Hemoglobin: 10.4 g/dL — ABNORMAL LOW (ref 13.0–17.0)
MCH: 32 pg (ref 26.0–34.0)
MCHC: 34.6 g/dL (ref 30.0–36.0)
MCV: 92.6 fL (ref 80.0–100.0)
Platelets: 131 10*3/uL — ABNORMAL LOW (ref 150–400)
RBC: 3.25 MIL/uL — ABNORMAL LOW (ref 4.22–5.81)
RDW: 13.4 % (ref 11.5–15.5)
WBC: 6.6 10*3/uL (ref 4.0–10.5)
nRBC: 0 % (ref 0.0–0.2)

## 2021-04-04 LAB — COMPREHENSIVE METABOLIC PANEL
ALT: 18 U/L (ref 0–44)
AST: 16 U/L (ref 15–41)
Albumin: 3.7 g/dL (ref 3.5–5.0)
Alkaline Phosphatase: 30 U/L — ABNORMAL LOW (ref 38–126)
Anion gap: 6 (ref 5–15)
BUN: 16 mg/dL (ref 6–20)
CO2: 25 mmol/L (ref 22–32)
Calcium: 9 mg/dL (ref 8.9–10.3)
Chloride: 105 mmol/L (ref 98–111)
Creatinine, Ser: 0.74 mg/dL (ref 0.61–1.24)
GFR, Estimated: >60 mL/min (ref >60)
Glucose, Bld: 109 mg/dL — ABNORMAL HIGH (ref 70–99)
Potassium: 3.4 mmol/L — ABNORMAL LOW (ref 3.5–5.1)
Sodium: 136 mmol/L (ref 135–145)
Total Bilirubin: 0.9 mg/dL (ref 0.3–1.2)
Total Protein: 6.3 g/dL — ABNORMAL LOW (ref 6.5–8.1)

## 2021-04-04 MED ORDER — KETOROLAC TROMETHAMINE 30 MG/ML IJ SOLN
30.0000 mg | Freq: Four times a day (QID) | INTRAMUSCULAR | Status: DC
Start: 2021-04-05 — End: 2021-04-05
  Administered 2021-04-04 – 2021-04-05 (×4): 30 mg via INTRAVENOUS
  Filled 2021-04-04 (×4): qty 1

## 2021-04-04 MED ORDER — DEXTROSE 50 % IV SOLN
12.5000 g | INTRAVENOUS | Status: AC
  Administered 2021-04-04: 12.5 g via INTRAVENOUS
  Filled 2021-04-04: qty 50

## 2021-04-04 MED ORDER — POTASSIUM CHLORIDE IN NACL 20-0.9 MEQ/L-% IV SOLN
INTRAVENOUS | Status: DC
  Filled 2021-04-04 (×2): qty 1000

## 2021-04-04 MED ORDER — MORPHINE SULFATE (PF) 2 MG/ML IV SOLN: 1.0000 mg | INTRAVENOUS | Status: DC | PRN

## 2021-04-04 MED ORDER — OXYCODONE-ACETAMINOPHEN 5-325 MG PO TABS
1.0000 | ORAL_TABLET | ORAL | Status: DC | PRN
Start: 2021-04-04 — End: 2021-04-06
  Administered 2021-04-04 – 2021-04-06 (×4): 1 via ORAL
  Filled 2021-04-04 (×4): qty 1

## 2021-04-04 MED ORDER — ACETAMINOPHEN 10 MG/ML IV SOLN
1000.0000 mg | Freq: Four times a day (QID) | INTRAVENOUS | Status: AC
  Administered 2021-04-05 (×4): 1000 mg via INTRAVENOUS
  Filled 2021-04-04 (×4): qty 100

## 2021-04-04 NOTE — Progress Notes (Signed)
Inpatient Diabetes Program Recommendations  AACE/ADA: New Consensus Statement on Inpatient Glycemic Control   Target Ranges:  Prepandial:   less than 140 mg/dL      Peak postprandial:   less than 180 mg/dL (1-2 hours)      Critically ill patients:  140 - 180 mg/dL   Results for Brian Williams, Brian Williams (MRN [id number]) as of 04/04/2021 07:21  Ref. Range 04/03/2021 17:31 04/03/2021 22:00 04/04/2021 03:29  Glucose-Capillary Latest Ref Range: 70 - 99 mg/dL 157 (H) 206 (H) 111 (H)  Results for Brian Williams, Brian Williams (MRN [id number]) as of 04/04/2021 07:21  Ref. Range 02/24/2021 00:00  Hemoglobin A1C Unknown 6.5   Review of Glycemic Control  Diabetes history: DM2 Outpatient Diabetes medications: Insulin pump with Humulin R U500 insulin, Ozempic 1 mg Qweek Current orders for Inpatient glycemic control: Insulin Pump AC&HS and 2am   NOTE: In reviewing chart, noted patient has DM2 and uses an insulin pump with Humulin R U500 (concentrated insulin) insulin for DM control. Per chart, patient [facility] and seen Dr. Loney Laurence on 02/24/21. Per office note on 02/24/21 by Dr. Loney Laurence patient is using a Medtronic 770G insulin pump with Humulin R U500 insulin and the following should be insulin pump settings:   Basal rates 12A 1.5 units/hour  7A 3.6 units/hour 7P 2 units/hour  Total Daily Basal: 63.7 units per day  Bolus settings I:C ratio 1: 7.5 g (1 unit covers 7.5 grams of carbs) ISF 13  mg/dl (1 unit drops glucose 13 mg/dl) Target 110-120 mg/dl Active insulin time 4 hrs.  Patient admitted 04/03/21 with appendicitis, NPO since midnight. Glucose 111 mg/dl at 3:29 am today. Spoke with patient regarding insulin pump and DM control. Patient has Medtronic insulin pump (with Humulin R U500) attached and using it for inpatient glycemic control. Verified insulin pump setting are exactly as noted above. Patient states that his glucose usually runs "fairly good" and he reports that he rarely has any issues with  hypoglycemia. Patient notes that he received steroid injections in his knees a few weeks ago and glucose was elevated for 4-5 days following the injections.  Patient reports his last A1C was 6.5% on 02/24/21 and his goal is to have A1C be in the 6% range. Patient reports that he has not ate anything today yet and he has been told that he would get antibiotics and if his pain is resolved he may be able to have surgery as an outpatient. Informed patient that our team will follow him while inpatient. Patient verbalized understanding of information and has no questions at this time.  Thanks, Barnie Alderman, RN, MSN, CDE Diabetes Coordinator Inpatient Diabetes Program [phone] (Team Pager from 8am to 5pm)

## 2021-04-04 NOTE — Progress Notes (Signed)
Rechecked blood sugar after 15 minutes of drinking OJ and it increased to 80.  Will continue to monitor CBG in a few hours.  Christene Slates

## 2021-04-04 NOTE — Progress Notes (Signed)
Patient's blood sugar was 69.  Given OJ. Will recheck in 15 minutes.  Brian Williams

## 2021-04-04 NOTE — Progress Notes (Signed)
Medicine Consult PROGRESS NOTE    Brian Williams  DXA:[id number] DOB: 06-22-1961 DOA: 04/03/2021 PCP: Pleas Koch, NP  [phone]   Assessment & Plan:   Principal Problem:   Acute appendicitis Active Problems:   Diabetes mellitus type 2, uncontrolled, with complications ([place])   Essential hypertension   Hyperlipidemia   Chronic systolic heart failure (HCC)   OSA on CPAP   Coronary artery disease   Depression   Brian Williams is an 60 y.o. male with PMH of hypertension, hyperlipidemia, diabetes mellitus, GERD, depression with anxiety, OSA on CPAP, V. tach, AICD placement, CAD, stent placement, CABG, sCHF with EF 35%, neuropathy, hiatal hernia, who presents with right lower quadrant abdominal pain.   Acute appendicitis:  CT abdomen/pelvis that showed uncomplicated appendicitis.  Patient has mild leukocytosis with WBC 10.9, but no fever.  Does not meet criteria for sepsis. -Patient was started on Zosyn by primary surgery team Plan: --cont Zosyn --NPO for now, diet per primary team -IV fluid: 50 cc/h for normal saline (patient has EF 35%, need to be careful when giving IV fluid) --change IV dilaudid to IV morphine   Diabetes mellitus type 2, uncontrolled, with complications ([place]): Z6O 6.5 recently, well controlled. --cont home insulin pump   Essential hypertension -IV hydralazine as needed -cont Entresto, metop, and lasix   Hyperlipidemia -Crestor, Lopid, Zetia   Chronic systolic heart failure ([place]): 2D echo on 05/24/2016 showed EF 35%.  Patient does not have leg edema.  No shortness of breath.  CHF is compensated. -Check BNP -cont Entresto, metop, and lasix   OSA -on CPAP   Coronary artery disease: S/p of CABG and stent placement.  No chest pain --hold ASA and plavix in case pt needs surgery   Depression -Lexapro   DVT prophylaxis: SCD/Compression stockings Code Status: Full code  Family Communication:  Level of care: Med-Surg Dispo:   Per primary  team   Subjective and Interval History:  Pt still had LLQ abdominal pain, but controlled with current regimen.  Having BM.   Objective: Vitals:   04/04/21 0810 04/04/21 0821 04/04/21 1549 04/04/21 1924  BP: (!) 91/39 107/66 105/66 112/70  Pulse: 71 74 67 65  Resp: 20  18 16   Temp: 98.6 F (37 C)  98.2 F (36.8 C) 98.1 F (36.7 C)  TempSrc: Oral  Oral Oral  SpO2: 96%  98% 98%  Weight:   117.8 kg   Height:   6' (1.829 m)     Intake/Output Summary (Last 24 hours) at 04/04/2021 1948 Last data filed at 04/04/2021 1924 Gross per 24 hour  Intake 1008.61 ml  Output 950 ml  Net 58.61 ml   Filed Weights   04/04/21 1549  Weight: 117.8 kg    Examination:   Constitutional: NAD, AAOx3 HEENT: conjunctivae and lids normal, EOMI CV: No cyanosis.   RESP: normal respiratory effort, on RA GI: No BS Extremities: No effusions, edema in BLE SKIN: warm, dry Neuro: II - XII grossly intact.   Psych: Normal mood and affect.  Appropriate judgement and reason   Data Reviewed: I have personally reviewed following labs and imaging studies  CBC: Recent Labs  Lab 04/03/21 1139 04/04/21 0407  WBC 10.9* 6.6  HGB 12.1* 10.4*  HCT 34.0* 30.1*  MCV 89.7 92.6  PLT 147* 294*   Basic Metabolic Panel: Recent Labs  Lab 04/03/21 1139 04/04/21 0407  NA 136 136  K 4.0 3.4*  CL 101 105  CO2 23 25  GLUCOSE 197*  109*  BUN 23* 16  CREATININE 0.99 0.74  CALCIUM 10.1 9.0   GFR: Estimated Creatinine Clearance: 130.1 mL/min (by C-G formula based on SCr of 0.74 mg/dL). Liver Function Tests: Recent Labs  Lab 04/03/21 1139 04/04/21 0407  AST 19 16  ALT 21 18  ALKPHOS 42 30*  BILITOT 0.8 0.9  PROT 7.9 6.3*  ALBUMIN 4.7 3.7   Recent Labs  Lab 04/03/21 1139  LIPASE 25   No results for input(s): AMMONIA in the last 168 hours. Coagulation Profile: No results for input(s): INR, PROTIME in the last 168 hours. Cardiac Enzymes: No results for input(s): CKTOTAL, CKMB, CKMBINDEX,  TROPONINI in the last 168 hours. BNP (last 3 results) No results for input(s): PROBNP in the last 8760 hours. HbA1C: No results for input(s): HGBA1C in the last 72 hours. CBG: Recent Labs  Lab 04/03/21 2200 04/04/21 0329 04/04/21 0807 04/04/21 1209 04/04/21 1645  GLUCAP 206* 111* 116* 114* 130*   Lipid Profile: No results for input(s): CHOL, HDL, LDLCALC, TRIG, CHOLHDL, LDLDIRECT in the last 72 hours. Thyroid Function Tests: No results for input(s): TSH, T4TOTAL, FREET4, T3FREE, THYROIDAB in the last 72 hours. Anemia Panel: No results for input(s): VITAMINB12, FOLATE, FERRITIN, TIBC, IRON, RETICCTPCT in the last 72 hours. Sepsis Labs: No results for input(s): PROCALCITON, LATICACIDVEN in the last 168 hours.  Recent Results (from the past 240 hour(s))  Resp Panel by RT-PCR (Flu A&B, Covid) Nasopharyngeal Swab     Status: None   Collection Time: 04/03/21  3:46 PM   Specimen: Nasopharyngeal Swab; Nasopharyngeal(NP) swabs in vial transport medium  Result Value Ref Range Status   SARS Coronavirus 2 by RT PCR NEGATIVE NEGATIVE Final    Comment: (NOTE) SARS-CoV-2 target nucleic acids are NOT DETECTED.  The SARS-CoV-2 RNA is generally detectable in upper respiratory specimens during the acute phase of infection. The lowest concentration of SARS-CoV-2 viral copies this assay can detect is 138 copies/mL. A negative result does not preclude SARS-Cov-2 infection and should not be used as the sole basis for treatment or other patient management decisions. A negative result may occur with  improper specimen collection/handling, submission of specimen other than nasopharyngeal swab, presence of viral mutation(s) within the areas targeted by this assay, and inadequate number of viral copies(<138 copies/mL). A negative result must be combined with clinical observations, patient history, and epidemiological information. The expected result is Negative.  Fact Sheet for Patients:   EntrepreneurPulse.com.au  Fact Sheet for Healthcare Providers:  IncredibleEmployment.be  This test is no t yet approved or cleared by the [place] FDA and  has been authorized for detection and/or diagnosis of SARS-CoV-2 by FDA under an Emergency Use Authorization (EUA). This EUA will remain  in effect (meaning this test can be used) for the duration of the COVID-19 declaration under Section 564(b)(1) of the Act, 21 U.S.C.section 360bbb-3(b)(1), unless the authorization is terminated  or revoked sooner.       Influenza A by PCR NEGATIVE NEGATIVE Final   Influenza B by PCR NEGATIVE NEGATIVE Final    Comment: (NOTE) The Xpert Xpress SARS-CoV-2/FLU/RSV plus assay is intended as an aid in the diagnosis of influenza from Nasopharyngeal swab specimens and should not be used as a sole basis for treatment. Nasal washings and aspirates are unacceptable for Xpert Xpress SARS-CoV-2/FLU/RSV testing.  Fact Sheet for Patients: EntrepreneurPulse.com.au  Fact Sheet for Healthcare Providers: IncredibleEmployment.be  This test is not yet approved or cleared by the [place] and has been authorized for  detection and/or diagnosis of SARS-CoV-2 by FDA under an Emergency Use Authorization (EUA). This EUA will remain in effect (meaning this test can be used) for the duration of the COVID-19 declaration under Section 564(b)(1) of the Act, 21 U.S.C. section 360bbb-3(b)(1), unless the authorization is terminated or revoked.  Performed at [facility], [address] [place], [place] [address]   CULTURE, BLOOD (ROUTINE X 2) w Reflex to ID Panel     Status: None (Preliminary result)   Collection Time: 04/03/21  5:47 PM   Specimen: BLOOD  Result Value Ref Range Status   Specimen Description BLOOD LEFT ANTECUBITAL  Final   Special Requests   Final    BOTTLES DRAWN AEROBIC AND ANAEROBIC Blood Culture  adequate volume   Culture   Final    NO GROWTH < 12 HOURS Performed at [facility], [address] [place], [place] [address]    Report Status PENDING  Incomplete  CULTURE, BLOOD (ROUTINE X 2) w Reflex to ID Panel     Status: None (Preliminary result)   Collection Time: 04/03/21  5:50 PM   Specimen: BLOOD  Result Value Ref Range Status   Specimen Description BLOOD RFA  Final   Special Requests Blood Culture adequate volume  Final   Culture   Final    NO GROWTH < 12 HOURS Performed at [facility], [address], [place], [place] [address]    Report Status PENDING  Incomplete      Radiology Studies: CT ABDOMEN PELVIS W CONTRAST  Result Date: 04/03/2021 CLINICAL DATA:  Right lower quadrant abdominal pain for 2 days EXAM: CT ABDOMEN AND PELVIS WITH CONTRAST TECHNIQUE: Multidetector CT imaging of the abdomen and pelvis was performed using the standard protocol following bolus administration of intravenous contrast. CONTRAST:  185mL OMNIPAQUE IOHEXOL 350 MG/ML SOLN COMPARISON:  12/06/2020 FINDINGS: Lower chest: No acute pleural or parenchymal lung disease. Hepatobiliary: Diffuse hepatic steatosis. Subcentimeter cysts are again identified within the liver. No intrahepatic duct dilation. The gallbladder is surgically absent. Pancreas: Unremarkable. No pancreatic ductal dilatation or surrounding inflammatory changes. Spleen: Normal in size without focal abnormality. Adrenals/Urinary Tract: Adrenal glands are unremarkable. Kidneys are normal, without renal calculi, focal lesion, or hydronephrosis. Bladder is unremarkable. Stomach/Bowel: No bowel obstruction or ileus. Dilated appendix is seen within the right lower quadrant, measuring up to 10 mm in diameter. There is significant inflammatory changes within the surrounding mesentery, with small appendicoliths identified in the appendiceal lumen, consistent with acute uncomplicated appendicitis. No perforation, fluid collection, or  abscess. Vascular/Lymphatic: Aortic atherosclerosis. No enlarged abdominal or pelvic lymph nodes. Reproductive: Prostate is unremarkable. Right hydroceles again noted. Other: Trace free fluid within the right lower quadrant surrounding the inflamed appendix. No free intraperitoneal gas. No abdominal wall hernia. Musculoskeletal: No acute or destructive bony lesions. Reconstructed images demonstrate no additional findings. IMPRESSION: 1. Dilated appendix and periappendiceal inflammatory changes consistent with acute uncomplicated appendicitis. No perforation, fluid collection, or abscess. 2. Hepatic steatosis. 3. Right hydrocele. 4.  Aortic Atherosclerosis (ICD10-I70.0). Electronically Signed   By: Randa Ngo M.D.   On: 04/03/2021 15:01     Scheduled Meds:  escitalopram  20 mg Oral Daily   ezetimibe  10 mg Oral Daily   furosemide  40 mg Oral BID   gabapentin  1,200 mg Oral BID   gemfibrozil  600 mg Oral Daily   insulin pump   Subcutaneous TID WC, HS, 0200   metoprolol succinate  50 mg Oral Daily   omega-3  acid ethyl esters  1 g Oral Daily   rosuvastatin  20 mg Oral Daily   sacubitril-valsartan  0.5 tablet Oral BID   traZODone  100 mg Oral QHS   Continuous Infusions:  0.9 % NaCl with KCl 20 mEq / L 50 mL/hr at 04/04/21 1723   piperacillin-tazobactam (ZOSYN)  IV 12.5 mL/hr at 04/04/21 1723     LOS: 1 day     Enzo Bi, MD [facility] If 7PM-7AM, please contact night-coverage 04/04/2021, 7:48 PM

## 2021-04-04 NOTE — Plan of Care (Signed)
  Problem: Activity: Goal: Capacity to carry out activities will improve 04/04/2021 0259 by Bonner Puna, RN Outcome: Progressing 04/03/2021 2349 by Bonner Puna, RN Outcome: Progressing   Problem: Cardiac: Goal: Ability to achieve and maintain adequate cardiopulmonary perfusion will improve 04/04/2021 0259 by Bonner Puna, RN Outcome: Progressing 04/03/2021 2349 by Bonner Puna, RN Outcome: Progressing   Problem: Clinical Measurements: Goal: Will remain free from infection 04/04/2021 0259 by Bonner Puna, RN Outcome: Progressing 04/03/2021 2349 by Bonner Puna, RN Outcome: Progressing   Problem: Coping: Goal: Level of anxiety will decrease 04/04/2021 0259 by Bonner Puna, RN Outcome: Progressing 04/03/2021 2349 by Bonner Puna, RN Outcome: Progressing

## 2021-04-04 NOTE — Progress Notes (Signed)
Pt continues to refuse CPAP use but aware one can be made available if needed.

## 2021-04-04 NOTE — Progress Notes (Addendum)
[facility] SURGICAL PROGRESS NOTE (cpt [phone])  Hospital Day(s): 1.   Interval History: Patient seen and examined, no acute events or new complaints overnight. Patient reports his pain is overall improved since presentation. He still has RLQ discomfort but less severe. No fever, chills, nausea, emesis. Previously seen leukocytosis is resolved, now 6.6K. Hgb 10.4, suspect this is dilutional in nature. Mild hypokalemia to 3.4. renal function remains normal; sCr - 0.74; UO - 600 ccs. He has remained Npo since admission.     Review of Systems:  Constitutional: denies fever, chills  HEENT: denies cough or congestion  Respiratory: denies any shortness of breath  Cardiovascular: denies chest pain or palpitations  Gastrointestinal: + abdominal pain (improving); denies N/V, or diarrhea Genitourinary: denies burning with urination or urinary frequency  Vital signs in last 24 hours: [min-max] current  Temp:  [97.9 F (36.6 C)-99.8 F (37.7 C)] 97.9 F (36.6 C) (07/19 0701) Pulse Rate:  [80-101] 86 (07/19 0701) Resp:  [15-20] 16 (07/19 0701) BP: (100-144)/(55-98) 100/55 (07/19 0701) SpO2:  [94 %-99 %] 94 % (07/19 0701)             Intake/Output last 2 shifts:  07/18 0701 - 07/19 0700 In: 653.3 [I.V.:558.4; IV Piggyback:94.9] Out: 600 [Urine:600]   Physical Exam:  Constitutional: alert, cooperative and no distress  HENT: normocephalic without obvious abnormality  Eyes: PERRL, EOM's grossly intact and symmetric  Respiratory: breathing non-labored at rest  Cardiovascular: regular rate and sinus rhythm  Gastrointestinal: soft, RLQ tenderness is markedly improved from yesterday, non-distended, no rebound/guarding Musculoskeletal: UE and LE FROM, no edema or wounds, motor and sensation grossly intact, NT    Labs:  CBC Latest Ref Rng & Units 04/04/2021 04/03/2021 12/08/2020  WBC 4.0 - 10.5 K/uL 6.6 10.9(H) 4.5  Hemoglobin 13.0 - 17.0 g/dL 10.4(L) 12.1(L) 8.6(L)  Hematocrit  39.0 - 52.0 % 30.1(L) 34.0(L) 24.3(L)  Platelets 150 - 400 K/uL 131(L) 147(L) 125(L)   CMP Latest Ref Rng & Units 04/04/2021 04/03/2021 03/14/2021  Glucose 70 - 99 mg/dL 109(H) 197(H) -  BUN 6 - 20 mg/dL 16 23(H) 20  Creatinine 0.61 - 1.24 mg/dL 0.74 0.99 1.0  Sodium 135 - 145 mmol/L 136 136 137  Potassium 3.5 - 5.1 mmol/L 3.4(L) 4.0 4.5  Chloride 98 - 111 mmol/L 105 101 103  CO2 22 - 32 mmol/L 25 23 28(A)  Calcium 8.9 - 10.3 mg/dL 9.0 10.1 10.6  Total Protein 6.5 - 8.1 g/dL 6.3(L) 7.9 -  Total Bilirubin 0.3 - 1.2 mg/dL 0.9 0.8 -  Alkaline Phos 38 - 126 U/L 30(L) 42 51  AST 15 - 41 U/L 16 19 14   ALT 0 - 44 U/L 18 21 19      Imaging studies: No new pertinent imaging studies   Assessment/Plan: (ICD-10's: K35.80) 60 y.o. male with improving abdominal pain and resolved leukocytosis acute uncomplicated appendicitis, complicated by pertinent comorbidities including significant cardiac history including CAD s/p CABGx4 on Plavix/ASA, CHF with EF 25%, HTN.   - Will leave NPO for now. Once his pain improves further we can initiate diet and ADAT  - IV Abx (Zosyn) - Gentle IVF in setting of CHF - Closely monitor abdominal examination; vitals - Pain control prn; antiemetics prn - Monitor leukocytosis; resolved - I have asked medicine to help with comorbid conditions; home medications; SSI - greatly appreciate help               - DVT prophylaxis; will hold in case he becomes  in need of procedure   All of the above findings and recommendations were discussed with the patient, and the medical team, and all of patient's questions were answered to his expressed satisfaction.  -- Edison Simon, PA-C [facility] 04/04/2021, 7:06 AM [phone] M-F: 7am - 4pm  I saw and evaluated the patient.  I agree with the above documentation, exam, and plan, which I have edited where appropriate. Fredirick Maudlin  1:48 PM

## 2021-04-05 DIAGNOSIS — K353 Acute appendicitis with localized peritonitis, without perforation or gangrene: Principal | ICD-10-CM

## 2021-04-05 LAB — CBC
HCT: 29.9 % — ABNORMAL LOW (ref 39.0–52.0)
Hemoglobin: 10.1 g/dL — ABNORMAL LOW (ref 13.0–17.0)
MCH: 30.8 pg (ref 26.0–34.0)
MCHC: 33.8 g/dL (ref 30.0–36.0)
MCV: 91.2 fL (ref 80.0–100.0)
Platelets: 128 10*3/uL — ABNORMAL LOW (ref 150–400)
RBC: 3.28 MIL/uL — ABNORMAL LOW (ref 4.22–5.81)
RDW: 13.3 % (ref 11.5–15.5)
WBC: 4.9 10*3/uL (ref 4.0–10.5)
nRBC: 0 % (ref 0.0–0.2)

## 2021-04-05 LAB — BASIC METABOLIC PANEL
Anion gap: 9 (ref 5–15)
BUN: 17 mg/dL (ref 6–20)
CO2: 25 mmol/L (ref 22–32)
Calcium: 9 mg/dL (ref 8.9–10.3)
Chloride: 106 mmol/L (ref 98–111)
Creatinine, Ser: 0.91 mg/dL (ref 0.61–1.24)
GFR, Estimated: >60 mL/min (ref >60)
Glucose, Bld: 104 mg/dL — ABNORMAL HIGH (ref 70–99)
Potassium: 3.7 mmol/L (ref 3.5–5.1)
Sodium: 140 mmol/L (ref 135–145)

## 2021-04-05 LAB — GLUCOSE, CAPILLARY
Glucose-Capillary: 65 mg/dL — ABNORMAL LOW (ref 70–99)
Glucose-Capillary: 103 mg/dL — ABNORMAL HIGH (ref 70–99)
Glucose-Capillary: 102 mg/dL — ABNORMAL HIGH (ref 70–99)
Glucose-Capillary: 117 mg/dL — ABNORMAL HIGH (ref 70–99)
Glucose-Capillary: 158 mg/dL — ABNORMAL HIGH (ref 70–99)
Glucose-Capillary: 73 mg/dL (ref 70–99)
Glucose-Capillary: 106 mg/dL — ABNORMAL HIGH (ref 70–99)

## 2021-04-05 LAB — MAGNESIUM: Magnesium: 2.2 mg/dL (ref 1.7–2.4)

## 2021-04-05 MED ORDER — DEXTROSE 50 % IV SOLN
25.0000 mL | Freq: Once | INTRAVENOUS | Status: AC
  Administered 2021-04-05: 25 mL via INTRAVENOUS

## 2021-04-05 MED ORDER — KCL IN DEXTROSE-NACL 20-5-0.9 MEQ/L-%-% IV SOLN
INTRAVENOUS | Status: DC
  Filled 2021-04-05 (×2): qty 1000

## 2021-04-05 NOTE — Progress Notes (Signed)
Patient's blood sugar was 60 after 2 cups of OJ were given.  Set up hypoglycemic order set and gave [name].  [name] and he changed maintenance fluid to D5 NS 20KCL @ 50 mL/hr.  Insulin pump has been suspended for now per Dr. Dahlia Byes.  Will continue to monitor CBG.  Brian Williams

## 2021-04-05 NOTE — Progress Notes (Signed)
Medicine Consult PROGRESS NOTE    Brian Williams  GYJ:[id number] DOB: 10/08/1960 DOA: 04/03/2021 PCP: Pleas Koch, NP  [phone]   Assessment & Plan:   Principal Problem:   Acute appendicitis Active Problems:   Diabetes mellitus type 2, uncontrolled, with complications ([place])   Essential hypertension   Hyperlipidemia   Chronic systolic heart failure (HCC)   OSA on CPAP   Coronary artery disease   Depression   Brian Williams is an 60 y.o. male with PMH of hypertension, hyperlipidemia, diabetes mellitus, GERD, depression with anxiety, OSA on CPAP, V. tach, AICD placement, CAD, stent placement, CABG, sCHF with EF 35%, neuropathy, hiatal hernia, who presents with right lower quadrant abdominal pain.   Acute appendicitis:  CT abdomen/pelvis that showed uncomplicated appendicitis.  Patient has mild leukocytosis with WBC 10.9, but no fever.  Does not meet criteria for sepsis. -Patient was started on Zosyn by primary surgery team Plan: --cont Zosyn --diet advanced to solids, per primary team --d/c IV toradol   Diabetes mellitus type 2, uncontrolled, with complications ([place]): Y7X 6.5 recently, well controlled. --pt is managing his own insulin pump   Essential hypertension -IV hydralazine as needed --cont Entresto, metop and lasix   Hyperlipidemia -Crestor, Lopid, Zetia   Chronic systolic heart failure ([place]): 2D echo on 05/24/2016 showed EF 35%.  Patient does not have leg edema.  No shortness of breath.  CHF is compensated. --cont Entresto, metop and lasix   OSA -on CPAP   Coronary artery disease:  S/p of CABG and stent placement.   No chest pain --hold ASA and plavix in case pt needs surgery --resume at discharge   Depression -Lexapro   DVT prophylaxis: SCD/Compression stockings Code Status: Full code  Family Communication:  Level of care: Med-Surg Dispo:   Per primary team   Subjective and Interval History:  Pt had hypoglycemic overnight from insulin  from his insulin pump.    Pt reported abdominal pain improved.  Was started on a diet, and tolerating it well.   Objective: Vitals:   04/04/21 1924 04/05/21 0355 04/05/21 0727 04/05/21 1636  BP: 112/70 (!) 90/57 106/63 (!) 109/59  Pulse: 65 61 61 64  Resp: 16 18 16 16   Temp: 98.1 F (36.7 C) 98 F (36.7 C) (!) 97.5 F (36.4 C) (!) 96.7 F (35.9 C)  TempSrc: Oral Oral Oral Axillary  SpO2: 98% 94% 100% 98%  Weight:      Height:        Intake/Output Summary (Last 24 hours) at 04/05/2021 1724 Last data filed at 04/05/2021 1400 Gross per 24 hour  Intake 716.98 ml  Output 650 ml  Net 66.98 ml   Filed Weights   04/04/21 1549  Weight: 117.8 kg    Examination:   Constitutional: NAD, AAOx3 HEENT: conjunctivae and lids normal, EOMI CV: No cyanosis.   RESP: normal respiratory effort, on RA Extremities: No effusions, edema in BLE SKIN: warm, dry Neuro: II - XII grossly intact.   Psych: Normal mood and affect.  Appropriate judgement and reason   Data Reviewed: I have personally reviewed following labs and imaging studies  CBC: Recent Labs  Lab 04/03/21 1139 04/04/21 0407 04/05/21 0525  WBC 10.9* 6.6 4.9  HGB 12.1* 10.4* 10.1*  HCT 34.0* 30.1* 29.9*  MCV 89.7 92.6 91.2  PLT 147* 131* 412*   Basic Metabolic Panel: Recent Labs  Lab 04/03/21 1139 04/04/21 0407 04/05/21 0525  NA 136 136 140  K 4.0 3.4* 3.7  CL  101 105 106  CO2 23 25 25   GLUCOSE 197* 109* 104*  BUN 23* 16 17  CREATININE 0.99 0.74 0.91  CALCIUM 10.1 9.0 9.0  MG  --   --  2.2   GFR: Estimated Creatinine Clearance: 114.4 mL/min (by C-G formula based on SCr of 0.91 mg/dL). Liver Function Tests: Recent Labs  Lab 04/03/21 1139 04/04/21 0407  AST 19 16  ALT 21 18  ALKPHOS 42 30*  BILITOT 0.8 0.9  PROT 7.9 6.3*  ALBUMIN 4.7 3.7   Recent Labs  Lab 04/03/21 1139  LIPASE 25   No results for input(s): AMMONIA in the last 168 hours. Coagulation Profile: No results for input(s): INR,  PROTIME in the last 168 hours. Cardiac Enzymes: No results for input(s): CKTOTAL, CKMB, CKMBINDEX, TROPONINI in the last 168 hours. BNP (last 3 results) No results for input(s): PROBNP in the last 8760 hours. HbA1C: No results for input(s): HGBA1C in the last 72 hours. CBG: Recent Labs  Lab 04/05/21 0302 04/05/21 0535 04/05/21 0732 04/05/21 1141 04/05/21 1632  GLUCAP 103* 102* 117* 158* 73   Lipid Profile: No results for input(s): CHOL, HDL, LDLCALC, TRIG, CHOLHDL, LDLDIRECT in the last 72 hours. Thyroid Function Tests: No results for input(s): TSH, T4TOTAL, FREET4, T3FREE, THYROIDAB in the last 72 hours. Anemia Panel: No results for input(s): VITAMINB12, FOLATE, FERRITIN, TIBC, IRON, RETICCTPCT in the last 72 hours. Sepsis Labs: No results for input(s): PROCALCITON, LATICACIDVEN in the last 168 hours.  Recent Results (from the past 240 hour(s))  Resp Panel by RT-PCR (Flu A&B, Covid) Nasopharyngeal Swab     Status: None   Collection Time: 04/03/21  3:46 PM   Specimen: Nasopharyngeal Swab; Nasopharyngeal(NP) swabs in vial transport medium  Result Value Ref Range Status   SARS Coronavirus 2 by RT PCR NEGATIVE NEGATIVE Final    Comment: (NOTE) SARS-CoV-2 target nucleic acids are NOT DETECTED.  The SARS-CoV-2 RNA is generally detectable in upper respiratory specimens during the acute phase of infection. The lowest concentration of SARS-CoV-2 viral copies this assay can detect is 138 copies/mL. A negative result does not preclude SARS-Cov-2 infection and should not be used as the sole basis for treatment or other patient management decisions. A negative result may occur with  improper specimen collection/handling, submission of specimen other than nasopharyngeal swab, presence of viral mutation(s) within the areas targeted by this assay, and inadequate number of viral copies(<138 copies/mL). A negative result must be combined with clinical observations, patient history, and  epidemiological information. The expected result is Negative.  Fact Sheet for Patients:  EntrepreneurPulse.com.au  Fact Sheet for Healthcare Providers:  IncredibleEmployment.be  This test is no t yet approved or cleared by the [place] FDA and  has been authorized for detection and/or diagnosis of SARS-CoV-2 by FDA under an Emergency Use Authorization (EUA). This EUA will remain  in effect (meaning this test can be used) for the duration of the COVID-19 declaration under Section 564(b)(1) of the Act, 21 U.S.C.section 360bbb-3(b)(1), unless the authorization is terminated  or revoked sooner.       Influenza A by PCR NEGATIVE NEGATIVE Final   Influenza B by PCR NEGATIVE NEGATIVE Final    Comment: (NOTE) The Xpert Xpress SARS-CoV-2/FLU/RSV plus assay is intended as an aid in the diagnosis of influenza from Nasopharyngeal swab specimens and should not be used as a sole basis for treatment. Nasal washings and aspirates are unacceptable for Xpert Xpress SARS-CoV-2/FLU/RSV testing.  Fact Sheet for Patients: EntrepreneurPulse.com.au  Fact  Sheet for Healthcare Providers: IncredibleEmployment.be  This test is not yet approved or cleared by the [place] and has been authorized for detection and/or diagnosis of SARS-CoV-2 by FDA under an Emergency Use Authorization (EUA). This EUA will remain in effect (meaning this test can be used) for the duration of the COVID-19 declaration under Section 564(b)(1) of the Act, 21 U.S.C. section 360bbb-3(b)(1), unless the authorization is terminated or revoked.  Performed at [facility], [address] [place], [place] [address]   CULTURE, BLOOD (ROUTINE X 2) w Reflex to ID Panel     Status: None (Preliminary result)   Collection Time: 04/03/21  5:47 PM   Specimen: BLOOD  Result Value Ref Range Status   Specimen Description BLOOD LEFT ANTECUBITAL   Final   Special Requests   Final    BOTTLES DRAWN AEROBIC AND ANAEROBIC Blood Culture adequate volume   Culture   Final    NO GROWTH 2 DAYS Performed at [facility], [address] [place], [place] [address]    Report Status PENDING  Incomplete  CULTURE, BLOOD (ROUTINE X 2) w Reflex to ID Panel     Status: None (Preliminary result)   Collection Time: 04/03/21  5:50 PM   Specimen: BLOOD  Result Value Ref Range Status   Specimen Description BLOOD RFA  Final   Special Requests Blood Culture adequate volume  Final   Culture   Final    NO GROWTH 2 DAYS Performed at [facility], [address], [place], [place] [address]    Report Status PENDING  Incomplete      Radiology Studies: No results found.   Scheduled Meds:  escitalopram  20 mg Oral Daily   ezetimibe  10 mg Oral Daily   furosemide  40 mg Oral BID   gabapentin  1,200 mg Oral BID   gemfibrozil  600 mg Oral Daily   insulin pump   Subcutaneous TID WC, HS, 0200   ketorolac  30 mg Intravenous Q6H   metoprolol succinate  50 mg Oral Daily   omega-3 acid ethyl esters  1 g Oral Daily   rosuvastatin  20 mg Oral Daily   sacubitril-valsartan  0.5 tablet Oral BID   traZODone  100 mg Oral QHS   Continuous Infusions:  acetaminophen 1,000 mg (04/05/21 1201)   piperacillin-tazobactam (ZOSYN)  IV 3.375 g (04/05/21 1322)     LOS: 2 days     Enzo Bi, MD [facility] If 7PM-7AM, please contact night-coverage 04/05/2021, 5:24 PM

## 2021-04-05 NOTE — Progress Notes (Signed)
[facility] SURGICAL PROGRESS NOTE (cpt [phone])  Hospital Day(s): 2.   Interval History: Patient seen and examined. He did have issues with hypoglycemia overnight. Patient reports his RLQ pain continues to improve; now only "a discomfort" and significantly improved from presentation. He denies fever, chills, nausea, emesis. Leukocytosis remains resolved; down to 4.9K. Renal function remains normal; sCr - 0.91; UO - 350 ccs + unmeasured. No electrolyte derangements. He remains on Zosyn. Has been NPO since admission.   Review of Systems:  Constitutional: denies fever, chills  HEENT: denies cough or congestion  Respiratory: denies any shortness of breath  Cardiovascular: denies chest pain or palpitations  Gastrointestinal: + abdominal pain (improved), denied N/V, or diarrhea/and bowel function as per interval history Genitourinary: denies burning with urination or urinary frequency   Vital signs in last 24 hours: [min-max] current  Temp:  [98 F (36.7 C)-98.6 F (37 C)] 98 F (36.7 C) (07/20 0355) Pulse Rate:  [61-74] 61 (07/20 0355) Resp:  [16-20] 18 (07/20 0355) BP: (90-112)/(39-70) 90/57 (07/20 0355) SpO2:  [94 %-98 %] 94 % (07/20 0355) Weight:  [117.8 kg] 117.8 kg (07/19 1549)     Height: 6' (182.9 cm) Weight: 117.8 kg BMI (Calculated): 35.21   Intake/Output last 2 shifts:  07/19 0701 - 07/20 0700 In: 1074.2 [I.V.:816.2; IV Piggyback:258] Out: 350 [Urine:350]   Physical Exam:  Constitutional: alert, cooperative and no distress HENT: normocephalic without obvious abnormality Eyes: PERRL, EOM's grossly intact and symmetric Respiratory: breathing non-labored at rest Cardiovascular: regular rate and sinus rhythm Gastrointestinal: soft, RLQ tenderness is markedly improved from presentation, nearly resolved, non-distended, no rebound/guarding Musculoskeletal: UE and LE FROM, no edema or wounds, motor and sensation grossly intact, NT     Labs:  CBC Latest Ref Rng  & Units 04/05/2021 04/04/2021 04/03/2021  WBC 4.0 - 10.5 K/uL 4.9 6.6 10.9(H)  Hemoglobin 13.0 - 17.0 g/dL 10.1(L) 10.4(L) 12.1(L)  Hematocrit 39.0 - 52.0 % 29.9(L) 30.1(L) 34.0(L)  Platelets 150 - 400 K/uL 128(L) 131(L) 147(L)   CMP Latest Ref Rng & Units 04/05/2021 04/04/2021 04/03/2021  Glucose 70 - 99 mg/dL 104(H) 109(H) 197(H)  BUN 6 - 20 mg/dL 17 16 23(H)  Creatinine 0.61 - 1.24 mg/dL 0.91 0.74 0.99  Sodium 135 - 145 mmol/L 140 136 136  Potassium 3.5 - 5.1 mmol/L 3.7 3.4(L) 4.0  Chloride 98 - 111 mmol/L 106 105 101  CO2 22 - 32 mmol/L 25 25 23   Calcium 8.9 - 10.3 mg/dL 9.0 9.0 10.1  Total Protein 6.5 - 8.1 g/dL - 6.3(L) 7.9  Total Bilirubin 0.3 - 1.2 mg/dL - 0.9 0.8  Alkaline Phos 38 - 126 U/L - 30(L) 42  AST 15 - 41 U/L - 16 19  ALT 0 - 44 U/L - 18 21     Imaging studies: No new pertinent imaging studies   Assessment/Plan: (ICD-10's: K35.80) 60 y.o. male with improving abdominal pain and resolved leukocytosis acute uncomplicated appendicitis, complicated by pertinent comorbidities including significant cardiac history including CAD s/p CABGx4 on Plavix/ASA, CHF with EF 25%, HTN.   - Will initiate CLD; ADAT - IV Abx (Zosyn); will need PO for home - Gentle IVF in setting of CHF; wean as diet advanced - Closely monitor abdominal examination; vitals - Pain control prn; antiemetics prn - Monitor leukocytosis; resolved - I have asked medicine to help with comorbid conditions; home medications; SSI - greatly appreciate help               - DVT  prophylaxis; will hold in case he becomes in need of procedure    - Discharge Planning: Pending advancement of diet, anticipate DC in the morning with PO Abx pending clinical condition   All of the above findings and recommendations were discussed with the patient, and the medical team, and all of patient's questions were answered to his expressed satisfaction.  -- Edison Simon, PA-C [facility] 04/05/2021, 7:14  AM [phone] M-F: 7am - 4pm

## 2021-04-05 NOTE — Progress Notes (Signed)
Rechecked patient's blood sugar @ 0300 and it increased to 103.  Will continue to monitor.  Christene Slates  04/05/2021 3:06 AM

## 2021-04-05 NOTE — Progress Notes (Signed)
Inpatient Diabetes Program Recommendations  AACE/ADA: New Consensus Statement on Inpatient Glycemic Control   Target Ranges:  Prepandial:   less than 140 mg/dL      Peak postprandial:   less than 180 mg/dL (1-2 hours)      Critically ill patients:  140 - 180 mg/dL   Results for GERVIS, GABA (MRN [id number]) as of 04/05/2021 07:38  Ref. Range 04/05/2021 02:09 04/05/2021 03:02 04/05/2021 05:35 04/05/2021 07:32  Glucose-Capillary Latest Ref Range: 70 - 99 mg/dL 65 (L) 103 (H) 102 (H) 117 (H)  Results for KIANO, TERRIEN (MRN [id number]) as of 04/05/2021 07:38  Ref. Range 04/04/2021 08:07 04/04/2021 12:09 04/04/2021 16:45 04/04/2021 21:18  Glucose-Capillary Latest Ref Range: 70 - 99 mg/dL 116 (H) 114 (H) 130 (H) 69 (L)    Review of Glycemic Control  Diabetes history: DM2 Outpatient Diabetes medications: Insulin pump with Humulin R U500 insulin, Ozempic 1 mg Qweek Current orders for Inpatient glycemic control: Insulin Pump AC&HS and 2am    Inpatient Diabetes Program Recommendations:    Insulin Pump: If patient will remain NPO, may want to have patient call his Endocrinologist (Dr. Loney Laurence) and ask for recommendations on insulin pump settings due to hypoglycemia during the night.  NOTE: Noted patient experienced hypoglycemia several times during the night which required treatment with PO intake and D50. Patient remains NPO this morning. Anticipate insulin pump settings need to be decreased especially if patient will remain NPO. Diabetes coordinator is not able to make adjustments with insulin pump settings (not within scope of practice).  Therefore, would recommend patient reach out to Enodcrinologist for recommendations on insulin pump settings     Thanks, Barnie Alderman, RN, MSN, CDE Diabetes Coordinator Inpatient Diabetes Program [phone] (Team Pager from 8am to 5pm)

## 2021-04-05 NOTE — Progress Notes (Signed)
Checked patient's blood sugar at 0200 and it read 65.  Called Dr. Dahlia Byes and was instructed to give another 31mL of D50.  Will assess in an hour.  Christene Slates

## 2021-04-06 ENCOUNTER — Ambulatory Visit: Payer: Medicare Other | Admitting: Student in an Organized Health Care Education/Training Program

## 2021-04-06 DIAGNOSIS — E785 Hyperlipidemia, unspecified: Secondary | ICD-10-CM

## 2021-04-06 DIAGNOSIS — G4733 Obstructive sleep apnea (adult) (pediatric): Secondary | ICD-10-CM

## 2021-04-06 DIAGNOSIS — Z794 Long term (current) use of insulin: Secondary | ICD-10-CM

## 2021-04-06 DIAGNOSIS — E119 Type 2 diabetes mellitus without complications: Secondary | ICD-10-CM

## 2021-04-06 LAB — GLUCOSE, CAPILLARY
Glucose-Capillary: 70 mg/dL (ref 70–99)
Glucose-Capillary: 103 mg/dL — ABNORMAL HIGH (ref 70–99)
Glucose-Capillary: 129 mg/dL — ABNORMAL HIGH (ref 70–99)
Glucose-Capillary: 88 mg/dL (ref 70–99)

## 2021-04-06 LAB — CBC
HCT: 28.9 % — ABNORMAL LOW (ref 39.0–52.0)
Hemoglobin: 9.9 g/dL — ABNORMAL LOW (ref 13.0–17.0)
MCH: 30.7 pg (ref 26.0–34.0)
MCHC: 34.3 g/dL (ref 30.0–36.0)
MCV: 89.5 fL (ref 80.0–100.0)
Platelets: 140 10*3/uL — ABNORMAL LOW (ref 150–400)
RBC: 3.23 MIL/uL — ABNORMAL LOW (ref 4.22–5.81)
RDW: 13.1 % (ref 11.5–15.5)
WBC: 4.1 10*3/uL (ref 4.0–10.5)
nRBC: 0 % (ref 0.0–0.2)

## 2021-04-06 LAB — BASIC METABOLIC PANEL
Anion gap: 12 (ref 5–15)
BUN: 19 mg/dL (ref 6–20)
CO2: 25 mmol/L (ref 22–32)
Calcium: 9.2 mg/dL (ref 8.9–10.3)
Chloride: 102 mmol/L (ref 98–111)
Creatinine, Ser: 0.89 mg/dL (ref 0.61–1.24)
GFR, Estimated: >60 mL/min (ref >60)
Glucose, Bld: 125 mg/dL — ABNORMAL HIGH (ref 70–99)
Potassium: 3.5 mmol/L (ref 3.5–5.1)
Sodium: 139 mmol/L (ref 135–145)

## 2021-04-06 LAB — MAGNESIUM: Magnesium: 2 mg/dL (ref 1.7–2.4)

## 2021-04-06 MED ORDER — AMOXICILLIN-POT CLAVULANATE 875-125 MG PO TABS: 1.0000 | ORAL_TABLET | Freq: Two times a day (BID) | ORAL | 0 refills | Status: AC

## 2021-04-06 NOTE — Discharge Summary (Addendum)
[facility] SURGICAL DISCHARGE SUMMARY (cpt: [phone])  Patient ID: Brian Williams MRN: [id number] DOB/AGE: 17-Feb-1961 60 y.o.  Admit date: 04/03/2021 Discharge date: 04/06/2021  Discharge Diagnoses Patient Active Problem List   Diagnosis Date Noted   Acute appendicitis 04/03/2021    Consultants None  Procedures None  HPI: 60-year-old male presented to [facility] ED today for RLQ pain. Patient reports the acute onset of initially suprapubic abdominal pain. He thought at first this was constipation -vs- a pulled muscle but the pain was not improved with laxative nor tylenol/ibuprofen. The pain ultimately migrated to his RLQ. He described this as sharp and severe in nature. Nothing seemed to make this better. Movement made this worse. He denied any associated fever, chills, CP, SOB, nausea, emesis, or bowel changes. Only previous abdominal surgery is cholecystectomy in early 2000s. He does have a significant cardiac history including CABGx4 on ASA/Plavix, CHF with most recent EF 25%, OSA, HTN, HLD, DM. No history of similar in the past. Work up in the ED revealed a mild leukocytosis to 10.9K but remaining lab work up was reassuring. CT Abdomen/Pelvis was concerning for acute uncomplicated appendicitis.  Hospital Course: Patient was admitted to general surgery service and elected to undergo conservative management given his significant cardiac issues. His leukocytosis resolved on HD1 and his abdominal pain improved as well. Diet was initiated and advanced on HD2. Advancement of patient's diet and ambulation were well-tolerated. The remainder of patient's hospital course was essentially unremarkable, and discharge planning was initiated accordingly with patient safely able to be discharged home with appropriate discharge instructions, antibiotics (Augmentin x12 days to complete total of 14 days), pain control, and outpatient follow-up after all of his questions were answered to his expressed  satisfaction.   Discharge Condition: Good   Physical Examination:  Constitutional: alert, cooperative and no distress HENT: normocephalic without obvious abnormality Eyes: PERRL, EOM's grossly intact and symmetric Respiratory: breathing non-labored at rest Cardiovascular: regular rate and sinus rhythm Gastrointestinal: soft, tenderness resolved, non-distended, no rebound/guarding Musculoskeletal: UE and LE FROM, no edema or wounds, motor and sensation grossly intact, NT   Allergies as of 04/06/2021       Reactions   Naproxen Other (See Comments), Anxiety, Rash   Causes hyperactivity causes hyperactivity   Morphine And Related Nausea And Vomiting   Morphine only        Medication List     TAKE these medications    amoxicillin-clavulanate 875-125 MG tablet Commonly known as: Augmentin Take 1 tablet by mouth 2 (two) times daily for 12 days.   aspirin EC 81 MG tablet Take 81 mg by mouth daily.   clopidogrel 75 MG tablet Commonly known as: PLAVIX TAKE ONE TABLET BY MOUTH DAILY   ezetimibe 10 MG tablet Commonly known as: ZETIA TAKE 1 TABLET BY MOUTH  DAILY   furosemide 40 MG tablet Commonly known as: LASIX Take 80 mg in the am & 40 mg in the pm.   gabapentin 600 MG tablet Commonly known as: NEURONTIN Take 2 tablets (1,200 mg total) by mouth 2 (two) times daily. For pain.   gemfibrozil 600 MG tablet Commonly known as: LOPID TAKE 1 TABLET BY MOUTH  DAILY   HUMULIN R 500 UNIT/ML injection Generic drug: insulin regular human CONCENTRATED Inject 0-25 Units into the skin daily at 12 noon.   metoprolol succinate 50 MG 24 hr tablet Commonly known as: TOPROL-XL Take 50 mg by mouth daily.   nitroGLYCERIN 0.4 MG SL tablet Commonly known as:  NITROSTAT Place 0.4 mg under the tongue every 5 (five) minutes as needed.   omega-3 acid ethyl esters 1 g capsule Commonly known as: LOVAZA Take 2 capsules by mouth 2 (two) times daily.   Ozempic (1 MG/DOSE) 4 MG/3ML  Sopn Generic drug: Semaglutide (1 MG/DOSE) Inject 1 mg into the skin once a week.   rosuvastatin 20 MG tablet Commonly known as: CRESTOR TAKE 1 TABLET BY MOUTH AT  BEDTIME FOR CHOLESTEROL   sacubitril-valsartan 97-103 MG Commonly known as: ENTRESTO Take 1 tablet by mouth 2 (two) times daily.   sildenafil 20 MG tablet Commonly known as: REVATIO Take 2 to 5 tablets by mouth 1 hour prior to intercourse as needed.   traZODone 100 MG tablet Commonly known as: DESYREL TAKE 2 TABLETS BY MOUTH  EVERY NIGHT AT BEDTIME       ASK your doctor about these medications    escitalopram 20 MG tablet Commonly known as: LEXAPRO TAKE 1 TABLET BY MOUTH  DAILY FOR ANXIETY AND  DEPRESSION   Fish Oil 1000 MG Caps Take 1,000 mg by mouth daily.   insulin glargine 100 UNIT/ML injection Commonly known as: LANTUS Inject 0.2 mLs (20 Units total) into the skin daily.   spironolactone 25 MG tablet Commonly known as: ALDACTONE Take 12.5 mg by mouth daily.          Follow-up Information     Fredirick Maudlin, MD. Schedule an appointment as soon as possible for a visit in 2 week(s).   Specialty: General Surgery Why: Hospital follow up, appendicitis Contact information: [address] [place] [address] [phone]                  Time spent on discharge management including discussion of hospital course, clinical condition, outpatient instructions, prescriptions, and follow up with the patient and members of the medical team: >30 minutes  -- Edison Simon , PA-C [facility]  04/06/2021, 9:42 AM [phone] M-F: 7am - 4pm  Patient departed the facility prior to my evaluation. I discussed his case with Mr. Olean Ree and reviewed labs and vital signs via the EMR.  I agree with the above documentation.

## 2021-04-06 NOTE — Progress Notes (Signed)
Telemetry called to say that patient's battery in the telemetry was "dead" and his "leads were off". Checked patient, sitting on side of bed with 2 leads off; battery replaced with fully charged battery, and leads replaced on new sites; lead set reset. CCMD called to confirm telemetry; voiced that they had made the view to the clearest. Barbaraann Faster, RN, Charge Nurse; 12:24 AM; 04/06/2021

## 2021-04-06 NOTE — Care Management Important Message (Signed)
Important Message  Patient Details  Name: Brian Williams MRN: [id number] Date of Birth: March 01, 1961   Medicare Important Message Given:  Yes     Dannette Barbara 04/06/2021, 1:54 PM

## 2021-04-06 NOTE — Progress Notes (Signed)
Patient's blood sugar was 70 at 0200 recheck.  Given ice cream.  Will access at 0400.  Brian Williams

## 2021-04-06 NOTE — Progress Notes (Signed)
Discharge instructions given to and reviewed with patient. Patient verbalized understanding of all discharge instructions including follow up appointment, new prescription, and when to call the doctor. Patient left floor alert with no distress noted by wheelchair with volunteer. Wife driving patient home for discharge.

## 2021-04-08 LAB — CULTURE, BLOOD (ROUTINE X 2)
Culture: NO GROWTH
Culture: NO GROWTH
Special Requests: ADEQUATE
Special Requests: ADEQUATE

## 2021-04-13 ENCOUNTER — Telehealth: Payer: Self-pay | Admitting: Primary Care

## 2021-04-13 NOTE — Chronic Care Management (AMB) (Signed)
  Chronic Care Management   Note  04/13/2021 Name: [name] MRN: [id number] DOB: [birth year]  Brian Williams is a 60 y.o. year old male who is a primary care patient of Pleas Koch, NP. I reached out to [name] by phone today in response to a referral sent by Brian Williams's PCP, Pleas Koch, NP.   [name] was given information about Chronic Care Management services today including:  CCM service includes personalized support from designated clinical staff supervised by his physician, including individualized plan of care and coordination with other care providers 24/7 contact phone numbers for assistance for urgent and routine care needs. Service will only be billed when office clinical staff spend 20 minutes or more in a month to coordinate care. Only one practitioner may furnish and bill the service in a calendar month. The patient may stop CCM services at any time (effective at the end of the month) by phone call to the office staff.  Patient agreed to services and verbal consent obtained.    Follow up plan:   Tatjana Secretary/administrator

## 2021-04-19 ENCOUNTER — Telehealth: Payer: Self-pay | Admitting: Pharmacist

## 2021-04-19 NOTE — Progress Notes (Signed)
Chronic Care Management Pharmacy Assistant   Name: CRISPIN COUGHRAN  MRN: [id number] DOB: Jul 29, 1961  Brian Williams is an 60 y.o. year old male who presents for his initial CCM visit with the clinical pharmacist.  Reason for Encounter: Initial Questions for CPP    Recent office visits:  12/06/20 Alma Friendly, NP Internal Medicine Reason:Diarrhea Medication changes:Please decrease the coreg down to 3.125 mg twice a day,Consider lasix every other day until stomach is better and fluid intake better   Recent consult visits:  11/21/20 Loura Pardon, NP Family Medicine Reason; Neck pain Orders: DG Cervical Spine Complete 03/21/21 Dr. Ida Rogue, Cardiology Medication Instructions:  No changes, please continue your current medications Lab work: No new labs needed Testing/Procedures: No new testing needed Hospital visits:  Medication Reconciliation was completed by comparing discharge summary, patient's EMR and Pharmacy list, and upon discussion with patient.  Admitted to the hospital on 04/03/21 due to abdominal pain. Discharge date was 04/03/21. Discharged from [facility] at [facility].   Admitted to hospital on 04/03/21 due to acute appendicitis New?Medications Started at [facility] Discharge:?? -started  None ID Medication Changes at Hospital Discharge: -Changed None ID  Medications Discontinued at Hospital Discharge: -Stopped None ID   Medications that remain the same after Hospital Discharge:??  -All other medications will remain the same.    Medications: Outpatient Encounter Medications as of 04/19/2021  Medication Sig Note   aspirin EC 81 MG tablet Take 81 mg by mouth daily.     clopidogrel (PLAVIX) 75 MG tablet TAKE ONE TABLET BY MOUTH DAILY    escitalopram (LEXAPRO) 20 MG tablet TAKE 1 TABLET BY MOUTH  DAILY FOR ANXIETY AND  DEPRESSION (Patient not taking: Reported on 04/03/2021)    ezetimibe (ZETIA) 10 MG tablet TAKE 1 TABLET BY MOUTH  DAILY     furosemide (LASIX) 40 MG tablet Take 80 mg in the am & 40 mg in the pm. 04/03/2021: Patient takes 40 mg in the morning and 40 mg in the evening   gabapentin (NEURONTIN) 600 MG tablet Take 2 tablets (1,200 mg total) by mouth 2 (two) times daily. For pain.    gemfibrozil (LOPID) 600 MG tablet TAKE 1 TABLET BY MOUTH  DAILY    insulin glargine (LANTUS) 100 UNIT/ML injection Inject 0.2 mLs (20 Units total) into the skin daily. (Patient not taking: No sig reported)    insulin regular human CONCENTRATED (HUMULIN R) 500 UNIT/ML injection Inject 0-25 Units into the skin daily at 12 noon. 04/03/2021: Patient uses pump   metoprolol succinate (TOPROL-XL) 50 MG 24 hr tablet Take 50 mg by mouth daily.    nitroGLYCERIN (NITROSTAT) 0.4 MG SL tablet Place 0.4 mg under the tongue every 5 (five) minutes as needed.    omega-3 acid ethyl esters (LOVAZA) 1 g capsule Take 2 capsules by mouth 2 (two) times daily.    Omega-3 Fatty Acids (FISH OIL) 1000 MG CAPS Take 1,000 mg by mouth daily. (Patient not taking: No sig reported)    rosuvastatin (CRESTOR) 20 MG tablet TAKE 1 TABLET BY MOUTH AT  BEDTIME FOR CHOLESTEROL    sacubitril-valsartan (ENTRESTO) 97-103 MG Take 1 tablet by mouth 2 (two) times daily. 04/03/2021: Patient takes one-half tablet twice daily   Semaglutide, 1 MG/DOSE, (OZEMPIC, 1 MG/DOSE,) 4 MG/3ML SOPN Inject 1 mg into the skin once a week. 04/03/2021: Wednesday morning   sildenafil (REVATIO) 20 MG tablet Take 2 to 5 tablets by mouth 1 hour prior to intercourse  as needed.    traZODone (DESYREL) 100 MG tablet TAKE 2 TABLETS BY MOUTH  EVERY NIGHT AT BEDTIME    [DISCONTINUED] DULoxetine (CYMBALTA) 30 MG capsule Take 1 capsule by mouth for 7 days, then 2 capsules thereafter for depression.    No facility-administered encounter medications on file as of 04/19/2021.    Pharmacist Review  Have you seen any other providers since your last visit? Patient states that the last provider was the nurse practitioner  Any  changes in your medications or health? Patient states that he does not have any new health issues or medication changes  Any side effects from any medications? Patient states that he does not have any side effects from medications  Do you have an symptoms or problems not managed by your medications? Patient states that he does not have any symptoms or problems not managed by medications  Any concerns about your health right now? Patient states that he does not have any concerns about his health at this time  Has your provider asked that you check blood pressure, blood sugar, or follow special diet at home? Patient states that he does not check blood pressure regularly and has a device that checks his blood sugar. Patient does not follow any diet plan  Do you get any type of exercise on a regular basis? Patient states that he does water aerobic a couple times a week  Can you think of a goal you would like to reach for your health? Patient states health goal is to lose weight  Do you have any problems getting your medications? Patient states he has not problems with getting medications or the cost of medications from the pharmacy  Is there anything that you would like to discuss during the appointment? Patient states not at this time  Please bring medications and supplements to appointment   Star Rating Drugs: Sacubitril -valsartan 97-107 mg 01/18/21 90 ds Rosuvastatin 20 mg 04/18/21 90 ds  Ethelene Hal Clinical Pharmacist Assistant [phone]   Time spent:40

## 2021-04-20 ENCOUNTER — Ambulatory Visit: Payer: Medicare Other | Admitting: General Surgery

## 2021-04-20 ENCOUNTER — Telehealth: Payer: Self-pay

## 2021-04-20 ENCOUNTER — Other Ambulatory Visit: Payer: Self-pay

## 2021-04-20 ENCOUNTER — Encounter: Payer: Self-pay | Admitting: General Surgery

## 2021-04-20 VITALS — BP 106/65 | HR 89 | Temp 98.5°F | Ht 72.0 in | Wt 264.8 lb

## 2021-04-20 DIAGNOSIS — K3580 Unspecified acute appendicitis: Secondary | ICD-10-CM

## 2021-04-20 DIAGNOSIS — K353 Acute appendicitis with localized peritonitis, without perforation or gangrene: Secondary | ICD-10-CM

## 2021-04-20 NOTE — Progress Notes (Signed)
Patient ID: Brian Williams, male   DOB: 08/05/1961, 60 y.o.   MRN: [id number]  Chief Complaint  Patient presents with   Follow-up    appendicitis    HPI Brian Williams is a 60 y.o. male.   He is here today for follow-up from a recent hospitalization.  He presented to the emergency department on April 03, 2021 with right lower quadrant pain.  CT scan was consistent with acute appendicitis without concern for perforation, however small appendicoliths were found within the appendiceal lumen.  Due to his significant cardiac comorbidities and the fact that he was on dual antiplatelet therapy, the decision was made to treat him conservatively with a course of IV antibiotics followed by outpatient oral antibiotics, planning for an interval appendectomy at some point in the future.  He has done well since his discharge from the hospital.  He states that occasionally he will notice a slight twinge or just "notice that something is not quite normal" in his right lower quadrant.  He denies any fevers or chills.  No nausea or vomiting.  His appetite is good and he is having normal bowel movements.  He is here today to discuss whether or not he would like to proceed with interval appendectomy, as well as the most suitable location to have that performed.  He has expressed significant concern for undergoing surgery at a small [facility], secondary to his experience when he underwent a cholecystectomy and ended up having myocardial infarction in the perioperative period, requiring air evacuation to a hospital with Cath Lab abilities.  Although this was quite some time ago, it clearly had a significant effect on his outlook toward surgical intervention.   Past Medical History:  Diagnosis Date   AICD (automatic cardioverter/defibrillator) present    Anxiety    takes Xanax as needed   Arthritis    back,knees,right shoulder   Back pain    Cardiomyopathy ([place])    CHF (congestive heart failure) (HCC)     takes Lasix and Aldactone daily   Coronary artery disease    takes Plavix daily   Depression    takes Zoloft daily   Diabetes mellitus without complication (HCC)    Humulin R and Farxiga daily.Fasting blood sugar runs 140   GERD (gastroesophageal reflux disease)    Headache    History of bronchitis    History of colon polyps    benign   History of hiatal hernia    Hyperlipidemia    takes Fenofibrate,Crestor, and Zetia daily   Hypertension    takes Entresto and Coreg daily   MI (myocardial infarction) ([place]) 2001   Obesity    Peripheral neuropathy    Pneumonia    history of-last time about 14 yrs ago   PONV (postoperative nausea and vomiting)    after knee surgery 25 yrs ago b/p stayed elevated for a while   Presence of permanent cardiac pacemaker    Shortness of breath dyspnea    with exertion   Sleep apnea    uses CPAP nightly   Ventricular tachycardia ([place])    s/p RFCA PVCs 2013    Past Surgical History:  Procedure Laterality Date   BACK SURGERY     CARDIAC CATHETERIZATION     CARDIAC CATHETERIZATION Left 05/10/2016   Procedure: Left Heart Cath and Coronary Angiography;  Surgeon: Minna Merritts, MD;  Location: [place] [facility];  Service: Cardiovascular;  Laterality: Left;   CARDIAC DEFIBRILLATOR PLACEMENT  10/16/2007  ICD Model number [id number] serial number [id number]   CARDIAC ELECTROPHYSIOLOGY STUDY AND ABLATION     CHOLECYSTECTOMY  2001   COLONOSCOPY     COLONOSCOPY WITH PROPOFOL N/A 08/28/2018   Procedure: COLONOSCOPY WITH PROPOFOL;  Surgeon: Jonathon Bellows, MD;  Location: [facility];  Service: Gastroenterology;  Laterality: N/A;   CORONARY ANGIOPLASTY WITH STENT PLACEMENT     7 stents   CORONARY ARTERY BYPASS GRAFT N/A 05/24/2016   Procedure: CORONARY ARTERY BYPASS GRAFTING (CABG) x four , using left internal mammary artery and left leg greater saphenous vein harvested endoscopically;  Surgeon: Gaye Pollack, MD;  Location: [place] OR;  Service: Open Heart Surgery;   Laterality: N/A;   EP IMPLANTABLE DEVICE N/A 06/16/2015   Procedure: ICD Generator Changeout;  Surgeon: Deboraha Sprang, MD;  Location: [place] [facility];  Service: Cardiovascular;  Laterality: N/A;   ESOPHAGOGASTRODUODENOSCOPY     INSERT / REPLACE / REMOVE PACEMAKER     KNEE SURGERY     bilateral knee    [name] WITHOUT CARDIOVERSION N/A 05/24/2016   Procedure: TRANSESOPHAGEAL ECHOCARDIOGRAM ([name]);  Surgeon: Gaye Pollack, MD;  Location: [place];  Service: Open Heart Surgery;  Laterality: N/A;   VASECTOMY      Family History  Problem Relation Age of Onset   Heart attack Mother 29   Hypertension Mother    Melanoma Mother    Heart attack Father 49   Hypertension Father    Hypertension Maternal Uncle    Heart disease Maternal Uncle    Heart disease Maternal Grandmother    Stroke Maternal Grandmother    Diabetes Neg Hx     Social History Social History   Tobacco Use   Smoking status: Former    Packs/day: 1.00    Years: 25.00    Pack years: 25.00    Types: Cigarettes    Quit date: 05/19/2018    Years since quitting: 2.9   Smokeless tobacco: Never  Vaping Use   Vaping Use: Never used  Substance Use Topics   Alcohol use: No    Comment: rare   Drug use: No    Allergies  Allergen Reactions   Naproxen Other (See Comments), Anxiety and Rash    Causes hyperactivity causes hyperactivity   Morphine And Related Nausea And Vomiting    Morphine only    Current Outpatient Medications  Medication Sig Dispense Refill   aspirin EC 81 MG tablet Take 81 mg by mouth daily.  90 tablet    clopidogrel (PLAVIX) 75 MG tablet TAKE ONE TABLET BY MOUTH DAILY 90 tablet 1   escitalopram (LEXAPRO) 20 MG tablet TAKE 1 TABLET BY MOUTH  DAILY FOR ANXIETY AND  DEPRESSION (Patient taking differently: TAKE 1 TABLET BY MOUTH  DAILY FOR ANXIETY AND  DEPRESSION) 90 tablet 3   ezetimibe (ZETIA) 10 MG tablet TAKE 1 TABLET BY MOUTH  DAILY 90 tablet 2   furosemide (LASIX) 40 MG tablet Take 80 mg in the am & 40  mg in the pm.     gabapentin (NEURONTIN) 600 MG tablet Take 2 tablets (1,200 mg total) by mouth 2 (two) times daily. For pain. 360 tablet 0   gemfibrozil (LOPID) 600 MG tablet TAKE 1 TABLET BY MOUTH  DAILY 90 tablet 3   glucose blood (PRECISION QID TEST) test strip      insulin regular human CONCENTRATED (HUMULIN R) 500 UNIT/ML injection Inject 0-25 Units into the skin daily at 12 noon.     metoprolol succinate (  TOPROL-XL) 50 MG 24 hr tablet Take 50 mg by mouth daily.     nitroGLYCERIN (NITROSTAT) 0.4 MG SL tablet Place 0.4 mg under the tongue every 5 (five) minutes as needed.     omega-3 acid ethyl esters (LOVAZA) 1 g capsule Take 2 capsules by mouth 2 (two) times daily.     Omega-3 Fatty Acids (FISH OIL) 1000 MG CAPS Take 1,000 mg by mouth daily.     rosuvastatin (CRESTOR) 20 MG tablet TAKE 1 TABLET BY MOUTH AT  BEDTIME FOR CHOLESTEROL 90 tablet 3   sacubitril-valsartan (ENTRESTO) 97-103 MG Take 1 tablet by mouth 2 (two) times daily.     Semaglutide, 1 MG/DOSE, (OZEMPIC, 1 MG/DOSE,) 4 MG/3ML SOPN Inject 1 mg into the skin once a week.     sildenafil (REVATIO) 20 MG tablet Take 2 to 5 tablets by mouth 1 hour prior to intercourse as needed. 50 tablet 0   traZODone (DESYREL) 100 MG tablet TAKE 2 TABLETS BY MOUTH  EVERY NIGHT AT BEDTIME 180 tablet 3   No current facility-administered medications for this visit.    Review of Systems Review of Systems  All other systems reviewed and are negative. Or as discussed in the history of present illness.  Blood pressure 106/65, pulse 89, temperature 98.5 F (36.9 C), temperature source Oral, height 6' (1.829 m), weight 264 lb 12.8 oz (120.1 kg), SpO2 97 %. Body mass index is 35.91 kg/m.  Physical Exam Physical Exam Constitutional:      General: He is not in acute distress.    Appearance: He is obese.  HENT:     Head: Normocephalic and atraumatic.     Nose:     Comments: Covered with a mask    Mouth/Throat:     Comments: Covered with a  mask Eyes:     General: No scleral icterus.       Right eye: No discharge.        Left eye: No discharge.  Neck:     Comments: Difficult exam secondary to body habitus.  No palpable cervical or supraclavicular lymphadenopathy.  The trachea is midline.  No thyromegaly or dominant thyroid masses appreciated. Cardiovascular:     Rate and Rhythm: Normal rate.  Pulmonary:     Effort: Pulmonary effort is normal.     Breath sounds: Normal breath sounds.  Abdominal:     Palpations: Abdomen is soft.     Comments: Protuberant, consistent with his level of obesity.  He is not exactly tender in the right lower quadrant, just that he is more aware of my deep palpation in that location versus the rest of his abdomen.  Genitourinary:    Comments: Deferred Musculoskeletal:     Comments: Trace ankle edema bilaterally.  Mild changes of venous stasis in his right lower extremity.  Skin:    General: Skin is warm and dry.  Neurological:     General: No focal deficit present.     Mental Status: He is alert and oriented to person, place, and time.  Psychiatric:        Mood and Affect: Mood normal.        Behavior: Behavior normal.    Data Reviewed I reviewed his hospital course as well as his imaging performed during that time.  Results for BURCH, BREITKREUTZ (MRN Brian [id number]) as of 04/20/2021 11:31  Ref. Range 04/03/2021 11:39 04/04/2021 04:07 04/05/2021 05:25 04/06/2021 06:38  Sodium Latest Ref Range: 135 - 145 mmol/L 136 136 140 139  Potassium Latest Ref Range: 3.5 - 5.1 mmol/L 4.0 3.4 (L) 3.7 3.5  Chloride Latest Ref Range: 98 - 111 mmol/L 101 105 106 102  CO2 Latest Ref Range: 22 - 32 mmol/L '23 25 25 25  '$ Glucose Latest Ref Range: 70 - 99 mg/dL 197 (H) 109 (H) 104 (H) 125 (H)  BUN Latest Ref Range: 6 - 20 mg/dL 23 (H) '16 17 19  '$ Creatinine Latest Ref Range: 0.61 - 1.24 mg/dL 0.99 0.74 0.91 0.89  Calcium Latest Ref Range: 8.9 - 10.3 mg/dL 10.1 9.0 9.0 9.2  Anion gap Latest Ref Range: 5 - '15  12 6 9 12   '$ Magnesium Latest Ref Range: 1.7 - 2.4 mg/dL   2.2 2.0  Alkaline Phosphatase Latest Ref Range: 38 - 126 U/L 42 30 (L)    Albumin Latest Ref Range: 3.5 - 5.0 g/dL 4.7 3.7    Lipase Latest Ref Range: 11 - 51 U/L 25     AST Latest Ref Range: 15 - 41 U/L 19 16    ALT Latest Ref Range: 0 - 44 U/L 21 18    Total Protein Latest Ref Range: 6.5 - 8.1 g/dL 7.9 6.3 (L)    Total Bilirubin Latest Ref Range: 0.3 - 1.2 mg/dL 0.8 0.9    GFR, Estimated Latest Ref Range: >60 mL/min >60 >60 >60 >60  B Natriuretic Peptide Latest Ref Range: 0.0 - 100.0 pg/mL 56.9     WBC Latest Ref Range: 4.0 - 10.5 K/uL 10.9 (H) 6.6 4.9 4.1  RBC Latest Ref Range: 4.22 - 5.81 MIL/uL 3.79 (L) 3.25 (L) 3.28 (L) 3.23 (L)  Hemoglobin Latest Ref Range: 13.0 - 17.0 g/dL 12.1 (L) 10.4 (L) 10.1 (L) 9.9 (L)  HCT Latest Ref Range: 39.0 - 52.0 % 34.0 (L) 30.1 (L) 29.9 (L) 28.9 (L)  MCV Latest Ref Range: 80.0 - 100.0 fL 89.7 92.6 91.2 89.5  MCH Latest Ref Range: 26.0 - 34.0 pg 31.9 32.0 30.8 30.7  MCHC Latest Ref Range: 30.0 - 36.0 g/dL 35.6 34.6 33.8 34.3  RDW Latest Ref Range: 11.5 - 15.5 % 13.3 13.4 13.3 13.1  Platelets Latest Ref Range: 150 - 400 K/uL 147 (L) 131 (L) 128 (L) 140 (L)  nRBC Latest Ref Range: 0.0 - 0.2 % 0.0 0.0 0.0 0.0   These labs demonstrate resolution of his leukocytosis, although some of that effect may have been delusional from rehydration.  He also has mild thrombocytopenia.  I reviewed the imaging and concur with the radiology interpretation copied here:   CLINICAL DATA:  Right lower quadrant abdominal pain for 2 days   EXAM: CT ABDOMEN AND PELVIS WITH CONTRAST   TECHNIQUE: Multidetector CT imaging of the abdomen and pelvis was performed using the standard protocol following bolus administration of intravenous contrast.   CONTRAST:  119m OMNIPAQUE IOHEXOL 350 MG/ML SOLN   COMPARISON:  12/06/2020   FINDINGS: Lower chest: No acute pleural or parenchymal lung disease.   Hepatobiliary: Diffuse  hepatic steatosis. Subcentimeter cysts are again identified within the liver. No intrahepatic duct dilation. The gallbladder is surgically absent.   Pancreas: Unremarkable. No pancreatic ductal dilatation or surrounding inflammatory changes.   Spleen: Normal in size without focal abnormality.   Adrenals/Urinary Tract: Adrenal glands are unremarkable. Kidneys are normal, without renal calculi, focal lesion, or hydronephrosis. Bladder is unremarkable.   Stomach/Bowel: No bowel obstruction or ileus. Dilated appendix is seen within the right lower quadrant, measuring up to 10 mm in diameter. There is significant inflammatory changes  within the surrounding mesentery, with small appendicoliths identified in the appendiceal lumen, consistent with acute uncomplicated appendicitis. No perforation, fluid collection, or abscess.   Vascular/Lymphatic: Aortic atherosclerosis. No enlarged abdominal or pelvic lymph nodes.   Reproductive: Prostate is unremarkable. Right hydroceles again noted.   Other: Trace free fluid within the right lower quadrant surrounding the inflamed appendix. No free intraperitoneal gas. No abdominal wall hernia.   Musculoskeletal: No acute or destructive bony lesions. Reconstructed images demonstrate no additional findings.   IMPRESSION: 1. Dilated appendix and periappendiceal inflammatory changes consistent with acute uncomplicated appendicitis. No perforation, fluid collection, or abscess. 2. Hepatic steatosis. 3. Right hydrocele. 4.  Aortic Atherosclerosis (ICD10-I70.0).  Assessment This is a 60 year old man with a very significant cardiac history who presented to the emergency department with acute appendicitis.  Although he did have small appendicoliths, his cardiac comorbidity and dual antiplatelet therapy suggested that a more prudent course of action was to treat him conservatively and consider elective interval appendectomy in the future.  I spent  greater than 50% of our 45-minute office consultation discussing the risks and benefits of continued observation versus surgery, as well as discussed a number of studies with him that evaluated the relative risk of recurrence of appendicitis after conservative management.  Mr. Ovid Curd feels strongly that he would like to undergo surgery, as his opinion is that if he were to have appendicitis again, he may not be as fortunate and could present perforated or septic and potentially require emergency intervention, while on his antiplatelet therapy, and that this would be riskier than proceeding in a controlled elective fashion.  He also indicated that his cardiologist feels very differently and would like him to avoid surgery at all cost.  Regardless of the pathway taken, he expressed a preference to have surgical intervention at a larger, tertiary medical center, if it takes place.  I offered to place referrals to any of the [place] including [facility], [facility], or [facility], as well as the main [facility] in [place].  He indicated a preference for [facility], as his heart failure physician is within that system.  Plan We have placed a referral to the general surgery group at [facility].  I will see Mr. Ovid Curd on an as-needed basis.    Fredirick Maudlin 04/20/2021, 11:14 AM

## 2021-04-20 NOTE — Patient Instructions (Addendum)
Continue to take all Antibiotic.   I will call you later today with the referral information.    Laparoscopic  Appendectomy, Adult, Care After This sheet gives you information about how to care for yourself after your procedure. Your doctor may also give you more specific instructions. If youhave problems or questions, contact your doctor. What can I expect after the procedure? After the procedure, it is common to have: Little energy for normal activities. Mild pain in the area where the cuts from surgery (incisions) were made. Trouble pooping (constipation). This can be caused by: Pain medicine. A lack of activity. Follow these instructions at home: Medicines Take over-the-counter and prescription medicines only as told by your doctor. If you were prescribed an antibiotic medicine, take it as told by your doctor. Do not stop taking it even if you start to feel better. Do not drive or use heavy machinery while taking prescription pain medicine. Ask your doctor if the medicine you are taking can cause trouble pooping. You may need to take steps to prevent or treat trouble pooping: Drink enough fluid to keep your pee (urine) pale yellow. Take over-the-counter or prescription medicines. Eat foods that are high in fiber. These include beans, whole grains, and fresh fruits and vegetables. Limit foods that are high in fat and sugar. These include fried or sweet foods. Incision care  Follow instructions from your doctor about how to take care of your cuts from surgery. Make sure you: Wash your hands with soap and water before and after you change your bandage (dressing). If you cannot use soap and water, use hand sanitizer. Change your bandage as told by your doctor. Leave stitches (sutures), skin glue, or skin tape (adhesive) strips in place. They may need to stay in place for 2 weeks or longer. If tape strips get loose and curl up, you may trim the loose edges. Do not remove tape strips  completely unless your doctor says it is okay. Check your cuts from surgery every day for signs of infection. Check for: Redness, swelling, or pain. Fluid or blood. Warmth. Pus or a bad smell.  Bathing Keep your cuts from surgery clean and dry. Clean them as told by your doctor. To do this: Gently wash the cuts with soap and water. Rinse the cuts with water to remove all soap. Pat the cuts dry with a clean towel. Do not rub the cuts. Do not take baths, swim, or use a hot tub for 2 weeks, or until your doctor says it is okay. You may take showers after 48 hours. Activity  Do not drive for 24 hours if you were given a medicine to help you relax (sedative) during your procedure. Rest after the procedure. Return to your normal activities as told by your doctor. Ask your doctor what activities are safe for you. For 3 weeks, or for as long as told by your doctor: Do not lift anything that is heavier than 10 lb (4.5 kg), or the limit that you are told. Do not play contact sports.  General instructions If you were sent home with a drain, follow instructions from your doctor on how to care for it. Take deep breaths. This helps to keep your lungs from getting an infection (pneumonia). Keep all follow-up visits as told by your doctor. This is important. Contact a doctor if: You have redness, swelling, or pain around a cut from surgery. You have fluid or blood coming from a cut. Your cut feels warm to the  touch. You have pus or a bad smell coming from a cut or a bandage. The edges of a cut break open after the stitches have been taken out. You have pain in your shoulders that gets worse. You feel dizzy or you pass out (faint). You have shortness of breath. You keep feeling sick to your stomach (nauseous). You keep throwing up (vomiting). You get watery poop (diarrhea) or you cannot control your poop. You lose your appetite. You have swelling or pain in your legs. You get a rash. Get help  right away if: You have a fever. You have trouble breathing. You have sharp pains in your chest. Summary After the procedure, it is common to have low energy, mild pain, and trouble pooping. Infection is a common problem after this procedure. Follow your doctor's instructions about caring for yourself after the procedure. Rest after the procedure. Return to your normal activities as told by your doctor. Contact your doctor if you see signs of infection around your cuts from surgery, or you get short of breath. Get help right away if you have a fever, chest pain, or trouble breathing. This information is not intended to replace advice given to you by your health care provider. Make sure you discuss any questions you have with your healthcare provider. Document Revised: 03/06/2018 Document Reviewed: 03/06/2018 Elsevier Patient Education  2022 [employer].    Appendicitis, Adult  Appendicitis is inflammation of the appendix. The appendix is a finger-shaped tube that is attached to the large intestine. If appendicitis is not treated, it can cause the appendix to tear (rupture). A ruptured appendix can lead to a life-threatening infection. It can also cause a painful collection of pus (abscess) to form in the appendix. What are the causes? This condition may be caused by a blockage in the appendix that leads to infection. The blockage can be caused by: A ball of stool (feces). Enlarged lymph glands. In some cases, the cause may not be known. What increases the risk? Age is a risk factor. You are more likely to develop this condition if you arebetween 58 and 60 years of age. What are the signs or symptoms? Symptoms of this condition include: Pain that starts around the belly button and moves toward the lower right part of the abdomen. The pain can become more severe as time passes. It gets worse with coughing or sudden movements. Tenderness in the lower right  abdomen. Nausea. Vomiting. Loss of appetite. Fever. Difficulty passing stool (constipation). Passing very loose stools (diarrhea). Generally feeling unwell. How is this diagnosed? This condition may be diagnosed with: A physical exam. Blood tests. Urine test. To confirm the diagnosis, an ultrasound, MRI, or CT scan may be done. How is this treated? Sometimes, this condition can be treated with antibiotic medicines alone. However, it is usually treated with both antibiotic medicines and surgery to remove the appendix (appendectomy). If only antibiotic medicine is given and the appendix is not removed, thereis a chance that appendicitis could come back. There are two methods for doing an appendectomy: Open appendectomy. In this surgery, the appendix is removed through a large incision that is made in the lower right abdomen. This procedure may be recommended if: You have major scarring from a previous surgery. You have a bleeding disorder. You are pregnant and are about to give birth. You have a condition that makes it hard to do surgery through small incisions (laparoscopic procedure). This includes severe infection or a ruptured appendix. Laparoscopic appendectomy. In  this surgery, the appendix is removed through small incisions. This procedure usually causes less pain and fewer problems than an open appendectomy. It also has a shorter recovery time. If the appendix has ruptured and an abscess has formed: A drain may be placed into the abscess to remove fluid. Antibiotic medicines may be given through an IV. The appendix may or may not need to be removed. Follow these instructions at home: If you had surgery, follow instructions from your health care provider abouthow to care for yourself at home and how to care for your incision. Medicines Take over-the-counter and prescription medicines only as told by your health care provider. If you were prescribed an antibiotic medicine, take it  as told by your health care provider. Do not stop taking the antibiotic even if you start to feel better. Eating and drinking Follow instructions from your health care provider about eating restrictions. You may slowly resume a regular diet once your nausea or vomiting stops. General instructions Do not use any products that contain nicotine or tobacco, such as cigarettes, e-cigarettes, and chewing tobacco. If you need help quitting, ask your health care provider. Do not drive or use heavy machinery while taking prescription pain medicine. Ask your health care provider if the medicine prescribed to you can cause constipation. You may need to take steps to prevent or treat constipation, such as: Drink enough fluid to keep your urine pale yellow. Take over-the-counter or prescription medicines. Eat foods that are high in fiber, such as beans, whole grains, and fresh fruits and vegetables. Limit foods that are high in fat and processed sugars, such as fried or sweet foods. Keep all follow-up visits as told by your health care provider. This is important. Contact a health care provider if: There is pus, blood, or excessive drainage coming from your incision. You have nausea or vomiting. Get help right away if you have: Worsening abdominal pain. A fever. Chills. Fatigue. Muscle aches. Shortness of breath. Summary Appendicitis is inflammation of the appendix. This condition may be caused by a blockage in the appendix that leads to infection. This condition is usually treated with both antibiotic medicines and surgery to remove the appendix (appendectomy). This information is not intended to replace advice given to you by your health care provider. Make sure you discuss any questions you have with your healthcare provider. Document Revised: 11/30/2019 Document Reviewed: 02/19/2018 Elsevier Patient Education  [place].

## 2021-04-21 NOTE — Telephone Encounter (Signed)
Spoke with @ Dr.Shanelle Campbells' [facility] surgery office to let them know referral, office notes, demographics and insurance information faxed to [phone].Left message on nurse [phone]  Spoke with patient to let him know they would review his notes and reach out to schedule an appointment-he was also instructed to call their office if he has not heard from anyone within 7-10 days. [phone].

## 2021-04-24 ENCOUNTER — Telehealth: Payer: Self-pay

## 2021-04-24 DIAGNOSIS — E1165 Type 2 diabetes mellitus with hyperglycemia: Secondary | ICD-10-CM

## 2021-04-24 DIAGNOSIS — IMO0002 Reserved for concepts with insufficient information to code with codable children: Secondary | ICD-10-CM

## 2021-04-24 NOTE — Telephone Encounter (Signed)
Spoke with [place] at [facility] has spoken with a surgeon at the main hospital and will be scheduling the patient there-they have reached out to the patient.

## 2021-04-27 DIAGNOSIS — I5022 Chronic systolic (congestive) heart failure: Secondary | ICD-10-CM | POA: Diagnosis not present

## 2021-04-27 DIAGNOSIS — I2510 Atherosclerotic heart disease of native coronary artery without angina pectoris: Secondary | ICD-10-CM | POA: Diagnosis not present

## 2021-04-27 DIAGNOSIS — I10 Essential (primary) hypertension: Secondary | ICD-10-CM | POA: Diagnosis not present

## 2021-04-27 DIAGNOSIS — E781 Pure hyperglyceridemia: Secondary | ICD-10-CM | POA: Diagnosis not present

## 2021-04-27 DIAGNOSIS — Z95810 Presence of automatic (implantable) cardiac defibrillator: Secondary | ICD-10-CM | POA: Diagnosis not present

## 2021-04-27 DIAGNOSIS — G4733 Obstructive sleep apnea (adult) (pediatric): Secondary | ICD-10-CM | POA: Diagnosis not present

## 2021-04-27 DIAGNOSIS — Z794 Long term (current) use of insulin: Secondary | ICD-10-CM | POA: Diagnosis not present

## 2021-04-27 DIAGNOSIS — E1142 Type 2 diabetes mellitus with diabetic polyneuropathy: Secondary | ICD-10-CM | POA: Diagnosis not present

## 2021-04-27 DIAGNOSIS — Z6834 Body mass index (BMI) 34.0-34.9, adult: Secondary | ICD-10-CM | POA: Diagnosis not present

## 2021-04-28 ENCOUNTER — Ambulatory Visit (INDEPENDENT_AMBULATORY_CARE_PROVIDER_SITE_OTHER): Payer: Medicare Other | Admitting: Pharmacist

## 2021-04-28 ENCOUNTER — Other Ambulatory Visit: Payer: Self-pay

## 2021-04-28 DIAGNOSIS — E118 Type 2 diabetes mellitus with unspecified complications: Secondary | ICD-10-CM

## 2021-04-28 DIAGNOSIS — I25118 Atherosclerotic heart disease of native coronary artery with other forms of angina pectoris: Secondary | ICD-10-CM | POA: Diagnosis not present

## 2021-04-28 DIAGNOSIS — I5022 Chronic systolic (congestive) heart failure: Secondary | ICD-10-CM

## 2021-04-28 DIAGNOSIS — E7800 Pure hypercholesterolemia, unspecified: Secondary | ICD-10-CM | POA: Diagnosis not present

## 2021-04-28 DIAGNOSIS — I10 Essential (primary) hypertension: Secondary | ICD-10-CM | POA: Diagnosis not present

## 2021-04-28 DIAGNOSIS — E1165 Type 2 diabetes mellitus with hyperglycemia: Secondary | ICD-10-CM

## 2021-04-28 DIAGNOSIS — E781 Pure hyperglyceridemia: Secondary | ICD-10-CM | POA: Diagnosis not present

## 2021-04-28 DIAGNOSIS — F411 Generalized anxiety disorder: Secondary | ICD-10-CM

## 2021-04-28 DIAGNOSIS — IMO0002 Reserved for concepts with insufficient information to code with codable children: Secondary | ICD-10-CM

## 2021-04-28 DIAGNOSIS — E1142 Type 2 diabetes mellitus with diabetic polyneuropathy: Secondary | ICD-10-CM

## 2021-04-28 MED ORDER — NITROGLYCERIN 0.4 MG SL SUBL: 0.4000 mg | SUBLINGUAL_TABLET | SUBLINGUAL | 1 refills | Status: AC | PRN

## 2021-04-28 NOTE — Patient Instructions (Signed)
Visit Information  Phone number for Pharmacist: [phone]  Thank you for meeting with me to discuss your medications! I look forward to working with you to achieve your health care goals. Below is a summary of what we talked about during the visit:   Goals Addressed             This Visit's Progress    Manage My Medicine       Timeframe:  Long-Range Goal Priority:  Medium Start Date:            04/28/21                 Expected End Date:    04/28/22                   Follow Up Date Nov 2022   - call for medicine refill 2 or 3 days before it runs out - call if I am sick and can't take my medicine - keep a list of all the medicines I take; vitamins and herbals too - use a pillbox to sort medicine  -Call Endocrine office for insulin pump adjustments in light of high sugars   Why is this important?   These steps will help you keep on track with your medicines.   Notes:         Care Plan : [place] plan  Updates made by Charlton Haws, RPH since 04/28/2021 12:00 AM     Problem: Hypertension, Hyperlipidemia, Diabetes, Heart Failure, Coronary Artery Disease, Depression, and Anxiety   Priority: High     Long-Range Goal: Disease management   Start Date: 04/28/2021  Expected End Date: 04/28/2022  This Visit's Progress: On track  Priority: High  Note:   Current Barriers:  Unable to independently monitor therapeutic efficacy Unable to maintain control of blood sugars  Pharmacist Clinical Goal(s):  Patient will achieve adherence to monitoring guidelines and medication adherence to achieve therapeutic efficacy maintain control of blood sugars as evidenced by Dexcom G6  through collaboration with PharmD and provider.   Interventions: 1:1 collaboration with Pleas Koch, NP regarding development and update of comprehensive plan of care as evidenced by provider attestation and co-signature Inter-disciplinary care team collaboration (see longitudinal plan  of care) Comprehensive medication review performed; medication list updated in electronic medical record  Hyperlipidemia / CAD (LDL goal < 70) -Controlled - LDL is at goal; Trig are elevated but < 500 most recently, previously in [place]. Pt endorses adherence. He reports Lovaza (generic) is expensive >$100 for 3 month supply -Pt is aware of drug interaction with rosuvastatin and gemfibrozil - pt does endorse hx of leg pain -Current treatment: Ezetimibe 10 mg daily Gemfibrozil 600 mg daily Rosuvastatin 20 mg daily Clopidogrel 75 mg daily Nitroglycerin 0.4 mg SL prn Lovaza 1 g - 2 cap BID OTC Fish oil 1000 mg daily Aspirin 81 mg daily -Current exercise habits: water aerobics few times a week -Educated on Cholesterol goals; Benefits of statin for ASCVD risk reduction; -Discussed gemfibrozil may increase risk for myopathy with rosuvastatin; pt currently tolerating reasonably well, he has significant hypertriglyceridemia and requires multiple therapies for this -Assessed pt finances - Lovaza is cost prohibitive, per insurance formulary it is Tier 3 (along with Vascepa); gemfibrozil is tier 2; can attempt tier exception -Recommended to continue current medication; pursue tier exception for Lovaza generic  Diabetes (A1c goal <7%) -Not ideally controlled - recent A1c at goal; however pt reports BG has been elevated since  hospitalization for appendicitis (BG normally 100-200, now 200-300); he has not contacted his endocrinologist because he is in process of re-establishing with endocrinologist (his previous Dr moved offices, he is temporarily under care of different dr at same practice but has not met them yet, he is awaiting appt with previous dr at his new office) -Pt is also interested in having Ozempic pt assistance come through PCP office instead of [facility] endocrine -Current medications: Humulin R U-500 via insulin pump Ozempic 1 mg weekly Dexcom G6 -Medications previously tried: Lovie Macadamia (yeast infections)  -Current home glucose readings fasting glucose: ~200 post prandial glucose: 300 -Educated on A1c and blood sugar goals; -Coordinate with PCP for Ozempic pt assistance renewal for 2023 -Advised patient to call endocrine for insulin pump adjustments; pt agreed to call  Heart Failure / HTN (Goal: BP < 130/80; prevent exacerbations) -Controlled - pt reports checking BP 2-3 times a week; pt has just increased Entresto to BID; he has not started spironolactone yet; he does have history of low BP in s/o dehydration/diarrhea; pt denies recent swelling/SOB -Current home B readings: 120/75 -Last ejection fraction: 25%  -HF type: Systolic -NYHA Class: III (marked limitation of activity) -Current treatment: Furosemide 40 mg - 2 tab AM, 1 tab PM Metoprolol succinate 50 mg daily Entresto 97-103 mg - 1/2 tab BID Spironolocatone 12.5 mg daily - not restarted yet -Educated on Benefits of medications for managing symptoms and prolonging life -Recommended to continue current medication  Depression/Anxiety (Goal: manage symptoms) -Controlled - pt reports mood is well controlled, he is sleeping well -Current treatment: Escitalopram 20 mg daily Trazodone 100 mg - 2 tab HS -Medications previously tried/failed: duloxetine, sertraline, -PHQ9: 16 (02/2020) -GAD7: 12 (07/2019) -Connected with PCP for mental health support -Educated on Benefits of medication for symptom control -Recommended to continue current medication  Neuropathy (Goal: manage symptoms) -Controlled - pt reports gabapentin was started for leg pain; he does not endorse excess drowsiness/sedation -Current treatment  Gabapentin 600 mg - 2 tab BID -Recommended to continue current medication  Health Maintenance -Vaccine gaps: Covid booster, Shingrix -Pt reports he has side effects with first covid booster (chills); he reports he had both doses of Shingrix at [facility]  Patient Goals/Self-Care Activities Patient  will:  - take medications as prescribed focus on medication adherence by pill box check glucose via Dexcom G6, document, and provide at future appointments check blood pressure daily, document, and provide at future appointments collaborate with provider on medication access solutions target a minimum of 150 minutes of moderate intensity exercise weekly -Call Endocrine office for insulin pump adjustments in light of high sugars      Brian Williams was given information about Chronic Care Management services today including:  CCM service includes personalized support from designated clinical staff supervised by his physician, including individualized plan of care and coordination with other care providers 24/7 contact phone numbers for assistance for urgent and routine care needs. Standard insurance, coinsurance, copays and deductibles apply for chronic care management only during months in which we provide at least 20 minutes of these services. Most insurances cover these services at 100%, however patients may be responsible for any copay, coinsurance and/or deductible if applicable. This service may help you avoid the need for more expensive face-to-face services. Only one practitioner may furnish and bill the service in a calendar month. The patient may stop CCM services at any time (effective at the end of the month) by phone call to the office staff.  Patient  agreed to services and verbal consent obtained.   Patient verbalizes understanding of instructions provided today and agrees to view in [place].  Telephone follow up appointment with pharmacy team member scheduled for: 3 months  [name], PharmD, [place], CPP Clinical Pharmacist [facility] at [facility] [phone]

## 2021-04-28 NOTE — Progress Notes (Signed)
Chronic Care Management Pharmacy Note  04/28/2021 Name:  SERGEI DELO MRN:  [id number] DOB:  July 06, 1961  Summary: -Pt reports sugars have been higher since appendicitis (normally 100-200, now 200-300); he has not contacted endocrinology -Pt reports omega-3 (generic Lovaza) is >$100 per 90 ds; it is a Tier 3 drug with [employer]  Recommendations/Changes made from today's visit: -Advised patient to call endocrinologist for insulin pump adjustments; pt agreed to call -Pursue tier exception for generic Lovaza   Subjective: ARIYAN BRISENDINE is an 60 y.o. year old male who is a primary patient of Pleas Koch, NP.  The CCM team was consulted for assistance with disease management and care coordination needs.    Engaged with patient face to face for initial visit in response to provider referral for pharmacy case management and/or care coordination services.   Consent to Services:  The patient was given the following information about Chronic Care Management services today, agreed to services, and gave verbal consent: 1. CCM service includes personalized support from designated clinical staff supervised by the primary care provider, including individualized plan of care and coordination with other care providers 2. 24/7 contact phone numbers for assistance for urgent and routine care needs. 3. Service will only be billed when office clinical staff spend 20 minutes or more in a month to coordinate care. 4. Only one practitioner may furnish and bill the service in a calendar month. 5.The patient may stop CCM services at any time (effective at the end of the month) by phone call to the office staff. 6. The patient will be responsible for cost sharing (co-pay) of up to 20% of the service fee (after annual deductible is met). Patient agreed to services and consent obtained.  Patient Care Team: Pleas Koch, NP as PCP - General (Internal Medicine) Minna Merritts, MD as PCP - Cardiology  (Cardiology) Minna Merritts, MD as Consulting Physician (Cardiology) Radene Knee Ladell Pier, MD as Consulting Physician (Cardiology) Jamelle Rushing, MD as Referring Physician (Dermatology) Charlton Haws, [facility] as Pharmacist (Pharmacist)   Patient lives at home with his wife and 2 adult children. He is from [place] area, moved to [place] after he was put on disability and cost of living in [place] became too high. 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   Recent office visits: 12/06/20 Alma Friendly, NP Internal Medicine Reason:Diarrhea - EMS took patient to hospital Medication changes: decrease the coreg down to 3.125 mg twice a day,Consider lasix every other day until stomach is better and fluid intake better  Recent consult visits: 04/27/21 Dr Morton Stall [facility] cardiology): increase Delene Loll to 1/2 tab BID; restart spironolactone 12.5 mg daily if BP ok; pt is off SGLT2 d/t recurrent yeast infection  04/20/21 Dr Celine Ahr (Gen surgery): f/u appendicitis. Referred to [facility] for surgery.  03/21/21 Dr Rockey Situ [facility] cardiology): f/u; d/c carvedilol, Welchol, Jardiance, Novolog d/t pt not taking.  02/24/21 Dr Selinda Flavin [facility] endocrine): decrease insulin by 10% due to hypoglycemia; current TDD ~90 units, 50-60% decrease from 1 year prior  02/07/21 Dr Roland Rack (ortho): f/u bilateral knee pain - received bilateral steroid injections  12/28/20 PA Woodard ([facility]): eval dysentery; diarrhea resolved.  Hospital visits: Medication Reconciliation was completed by comparing discharge summary, patient's EMR and Pharmacy list, and upon discussion with patient.  Admitted to the hospital on 04/03/21 due to Acute appendicitis. Discharge date was 04/06/21. Discharged from [facility].   -underwent conservative mgmt due to cardiac issues  New?Medications Started  at [facility] Discharge:?? -started Augmentin 875-125 mg BID x 12 days  Medications that remain the same after Hospital Discharge:??  -All  other medications will remain the same.    Admitted to the hospital on 12/06/20 due to Dehydration/AKI s/o diarhea. Discharge date was 12/08/20. Discharged from [facility].    New?Medications Started at [facility] Discharge:?? -started Rifaximin TID x 14 days -plan to start Welchol  Medication Changes at Hospital Discharge: -Changed carvedilol to 12.5 mg BID  Medications Discontinued at Hospital Discharge: -Stopped Entresto, spironolactone (on hold in s/o AKI)  Medications that remain the same after Hospital Discharge:??  -All other medications will remain the same.    Objective:  Lab Results  Component Value Date   CREATININE 0.89 04/06/2021   BUN 19 04/06/2021   GFR 81.43 07/22/2020   GFRNONAA >60 04/06/2021   GFRAA 111 04/05/2020   NA 139 04/06/2021   K 3.5 04/06/2021   CALCIUM 9.2 04/06/2021   CO2 25 04/06/2021   GLUCOSE 125 (H) 04/06/2021    Lab Results  Component Value Date/Time   HGBA1C 6.5 02/24/2021 12:00 AM   HGBA1C 6.8 (H) 12/06/2020 09:20 PM   HGBA1C 8.6 (H) 07/22/2020 08:59 AM   GFR 81.43 07/22/2020 08:59 AM   GFR 95.16 01/30/2019 10:11 AM    Last diabetic Eye exam:  Lab Results  Component Value Date/Time   HMDIABEYEEXA No Retinopathy 10/04/2020 12:00 AM    Last diabetic Foot exam: No results found for: HMDIABFOOTEX   Lab Results  Component Value Date   CHOL 125 07/22/2020   HDL 32.10 (L) 07/22/2020   LDLCALC 65 07/22/2020   LDLDIRECT 42.0 07/21/2018   TRIG 139.0 07/22/2020   CHOLHDL 4 07/22/2020    Hepatic Function Latest Ref Rng & Units 04/04/2021 04/03/2021 03/14/2021  Total Protein 6.5 - 8.1 g/dL 6.3(L) 7.9 -  Albumin 3.5 - 5.0 g/dL 3.7 4.7 4.5  AST 15 - 41 U/L _0 ALT 0 - 44 U/L _1 Alk Phosphatase 38 - 126 U/L 30(L) 42 51  Total Bilirubin 0.3 - 1.2 mg/dL 0.9 0.8 -  Bilirubin, Direct 0.00 - 0.40 mg/dL - - -    Lab Results  Component Value Date/Time   TSH 3.804 12/07/2020 05:58 AM   TSH 3.210 06/03/2015 11:54 AM    CBC  Latest Ref Rng & Units 04/06/2021 04/05/2021 04/04/2021  WBC 4.0 - 10.5 K/uL 4.1 4.9 6.6  Hemoglobin 13.0 - 17.0 g/dL 9.9(L) 10.1(L) 10.4(L)  Hematocrit 39.0 - 52.0 % 28.9(L) 29.9(L) 30.1(L)  Platelets 150 - 400 K/uL 140(L) 128(L) 131(L)   Iron/TIBC/Ferritin/ %Sat    Component Value Date/Time   IRON 231 (H) 12/06/2020 2120   TIBC 487 (H) 12/06/2020 2120   FERRITIN 364 (H) 12/06/2020 2120   IRONPCTSAT 47 (H) 12/06/2020 2120    No results found for: VD25OH  Clinical ASCVD: Yes  The ASCVD Risk score Mikey Bussing DC Jr., et al., 2013) failed to calculate for the following reasons:   The patient has a prior MI or stroke diagnosis    Depression screen [facility] 2/9 02/17/2020 01/18/2020 07/23/2019  Decreased Interest _2 Down, Depressed, Hopeless _3 PHQ - 2 Score _4 Altered sleeping 0 1 1  Tired, decreased energy _5 Change in appetite _6 Feeling bad or failure about yourself  _7 Trouble concentrating _8 Moving slowly or fidgety/restless 3 3 2  Suicidal thoughts 1 1 0  PHQ-9 Score _0 Difficult doing work/chores Somewhat difficult Extremely dIfficult Somewhat difficult  Some recent data might be hidden    GAD 7 : Generalized Anxiety Score 07/23/2019 07/08/2017  Nervous, Anxious, on Edge 2 3  Control/stop worrying 3 3  Worry too much - different things 3 3  Trouble relaxing 1 3  Restless 1 3  Easily annoyed or irritable 1 1  Afraid - awful might happen 1 3  Total GAD 7 Score 12 19  Anxiety Difficulty Somewhat difficult Somewhat difficult    Social History   Tobacco Use  Smoking Status Former   Packs/day: 1.00   Years: 25.00   Pack years: 25.00   Types: Cigarettes   Quit date: 05/19/2018   Years since quitting: 2.9  Smokeless Tobacco Never   BP Readings from Last 3 Encounters:  04/20/21 106/65  04/06/21 115/66  04/03/21 (!) 144/73   Pulse Readings from Last 3 Encounters:  04/20/21 89  04/06/21 62  04/03/21 (!) 101   Wt Readings from Last 3  Encounters:  04/20/21 264 lb 12.8 oz (120.1 kg)  04/04/21 259 lb 11.2 oz (117.8 kg)  03/21/21 255 lb 4 oz (115.8 kg)   BMI Readings from Last 3 Encounters:  04/20/21 35.91 kg/m  04/04/21 35.22 kg/m  03/21/21 34.62 kg/m    Assessment/Interventions: Review of patient past medical history, allergies, medications, health status, including review of consultants reports, laboratory and other test data, was performed as part of comprehensive evaluation and provision of chronic care management services.   SDOH:  (Social Determinants of Health) assessments and interventions performed: Yes  SDOH Screenings   Alcohol Screen: Not on file  Depression (PHQ2-9): Not on file  Financial Resource Strain: Not on file  Food Insecurity: Not on file  Housing: Not on file  Physical Activity: Not on file  Social Connections: Not on file  Stress: Not on file  Tobacco Use: Medium Risk   Smoking Tobacco Use: Former   Smokeless Tobacco Use: Never  Transportation Needs: Not on file    [place]  Allergies  Allergen Reactions   Naproxen Other (See Comments), Anxiety and Rash    Causes hyperactivity causes hyperactivity   Morphine And Related Nausea And Vomiting    Morphine only    Medications Reviewed Today     Reviewed by Charlton Haws, [facility] (Pharmacist) on 04/28/21 at [place] List Status: <None>   Medication Order Taking? Sig Documenting Provider Last Dose Status Informant  aspirin EC 81 MG tablet [id number] Yes Take 81 mg by mouth daily.  Minna Merritts, MD Taking Active Self  clopidogrel (PLAVIX) 75 MG tablet [id number] Yes TAKE ONE TABLET BY MOUTH DAILY Rockey Situ Kathlene November, MD Taking Active Self  Discontinued 04/28/21 1327 (Patient has not taken in last 30 days)   escitalopram (LEXAPRO) 20 MG tablet [id number] Yes TAKE 1 TABLET BY MOUTH  DAILY FOR ANXIETY AND  DEPRESSION  Patient taking differently: TAKE 1 TABLET BY MOUTH  DAILY FOR ANXIETY AND  DEPRESSION   Pleas Koch, NP Taking Active   ezetimibe (ZETIA) 10 MG tablet [id number] Yes TAKE 1 TABLET BY MOUTH  DAILY Gollan, Kathlene November, MD Taking Active Self  furosemide (LASIX) 40 MG tablet [id number] Yes Take 80 mg in the am & 40 mg in the pm. [provider] Taking Active Self           Med Note Lenord Fellers, Cleaster [place]  Fri Apr 28, 2021  1:24 PM)    gabapentin (NEURONTIN) 600 MG tablet [id number] Yes Take 2 tablets (1,200 mg total) by mouth 2 (two) times daily. For pain. Pleas Koch, NP Taking Active Self  gemfibrozil (LOPID) 600 MG tablet [id number] Yes TAKE 1 TABLET BY MOUTH  DAILY Pleas Koch, NP Taking Active Self  glucose blood (PRECISION QID TEST) test strip [id number] Yes  [provider] Taking Active   insulin regular human CONCENTRATED (HUMULIN R) 500 UNIT/ML injection [id number] Yes Inject 0-25 Units into the skin daily at 12 noon. [provider] Taking Active Self           Med Note Delice Bison Apr 03, 2021  5:39 PM) Patient uses pump  metoprolol succinate (TOPROL-XL) 50 MG 24 hr tablet [id number] Yes Take 50 mg by mouth daily. [provider] Taking Active Self  nitroGLYCERIN (NITROSTAT) 0.4 MG SL tablet [id number] Yes Place 1 tablet (0.4 mg total) under the tongue every 5 (five) minutes as needed for chest pain. Pleas Koch, NP Taking Active   omega-3 acid ethyl esters (LOVAZA) 1 g capsule [id number] Yes Take 2 capsules by mouth 2 (two) times daily. [provider] Taking Active Self  Patient not taking:  Discontinued 04/28/21 1325 (Patient Preference) rosuvastatin (CRESTOR) 20 MG tablet [id number] Yes TAKE 1 TABLET BY MOUTH AT  BEDTIME FOR CHOLESTEROL Pleas Koch, NP Taking Active Self  sacubitril-valsartan (ENTRESTO) 97-103 MG [id number] Yes Take 0.5 tablets by mouth 2 (two) times daily. [provider] Taking Active Self           Med Note Charlton Haws   Fri Apr 28, 2021  1:25 PM)    Semaglutide, 1  MG/DOSE, (OZEMPIC, 1 MG/DOSE,) 4 MG/3ML SOPN [id number] Yes Inject 1 mg into the skin once a week. [provider] Taking Active Self           Med Note Delice Bison Apr 03, 2021  5:37 PM) Wednesday morning  sildenafil (REVATIO) 20 MG tablet [id number] Yes Take 2 to 5 tablets by mouth 1 hour prior to intercourse as needed. Pleas Koch, NP Taking Active Self  traZODone (DESYREL) 100 MG tablet [id number] Yes TAKE 2 TABLETS BY MOUTH  EVERY NIGHT AT BEDTIME Pleas Koch, NP Taking Active Self            Patient Active Problem List   Diagnosis Date Noted   Acute appendicitis 04/03/2021   Depression 04/03/2021   Diarrhea 12/06/2020   Hypotension 12/06/2020   Tingling 12/01/2020   Vision changes 12/01/2020   Dizziness 11/23/2020   Sinusitis, acute 08/08/2020   Diabetic neuropathy associated with type 2 diabetes mellitus ([place]) 08/05/2020   Falls, initial encounter 08/05/2020   Leg pain, bilateral 08/05/2020   Sensory ataxia 08/05/2020   Chronic knee pain 07/25/2020   Myalgia 01/28/2019   Major depressive disorder 07/08/2017   Erectile dysfunction 07/08/2017   Hx of CABG 07/26/2016   Coronary artery disease 05/24/2016   Atherosclerosis of native coronary artery of native heart with stable angina pectoris ([place])    History of coronary artery stent placement    Unstable angina ([place]) 05/04/2016   Obesity (BMI 30-39.9) 04/06/2016   OSA on CPAP 01/06/2016   Diabetes, polyneuropathy ([place]) 01/06/2016   Hidradenitis suppurativa 10/06/2015   Chronic back pain 05/02/2015   Essential hypertension 05/02/2015   Hyperlipidemia 05/02/2015   Generalized anxiety disorder 05/02/2015   ICD (  implantable cardioverter-defibrillator) in place 04/20/2015   Congestive dilated cardiomyopathy ([place]) 04/20/2015   Diabetes mellitus type 2, uncontrolled, with complications ([place]) 07/86/7544   Chronic systolic heart failure ([place]) 09/30/2014    Immunization History   Administered Date(s) Administered   Influenza Inj Mdck Quad Pf 06/25/2018, 06/05/2019   Influenza Split 07/22/2015   Influenza,inj,Quad PF,6+ Mos 06/13/2016, 06/24/2020   Influenza-Unspecified 06/25/2018, 06/06/2019   Moderna Sars-Covid-2 Vaccination 10/31/2019, 11/21/2019, 07/12/2020   Pneumococcal Polysaccharide-23 07/21/2018   Td 04/17/2018   Tdap 02/22/2018    Conditions to be addressed/monitored:  Hypertension, Hyperlipidemia, Diabetes, Heart Failure, Coronary Artery Disease, Depression, and Anxiety  Care Plan : [place] plan  Updates made by Charlton Haws, [place] since 04/28/2021 12:00 AM     Problem: Hypertension, Hyperlipidemia, Diabetes, Heart Failure, Coronary Artery Disease, Depression, and Anxiety   Priority: High     Long-Range Goal: Disease management   Start Date: 04/28/2021  Expected End Date: 04/28/2022  This Visit's Progress: On track  Priority: High  Note:   Current Barriers:  Unable to independently monitor therapeutic efficacy Unable to maintain control of blood sugars  Pharmacist Clinical Goal(s):  Patient will achieve adherence to monitoring guidelines and medication adherence to achieve therapeutic efficacy maintain control of blood sugars as evidenced by Dexcom G6  through collaboration with PharmD and provider.   Interventions: 1:1 collaboration with Pleas Koch, NP regarding development and update of comprehensive plan of care as evidenced by provider attestation and co-signature Inter-disciplinary care team collaboration (see longitudinal plan of care) Comprehensive medication review performed; medication list updated in electronic medical record  Hyperlipidemia / CAD (LDL goal < 70) -Controlled - LDL is at goal; Trig are elevated but < 500 most recently, previously in [place]. Pt endorses adherence. He reports Lovaza (generic) is expensive >$100 for 3 month supply -Pt is aware of drug interaction with rosuvastatin and gemfibrozil  - pt does endorse hx of leg pain -Current treatment: Ezetimibe 10 mg daily Gemfibrozil 600 mg daily Rosuvastatin 20 mg daily Clopidogrel 75 mg daily Nitroglycerin 0.4 mg SL prn Lovaza 1 g - 2 cap BID OTC Fish oil 1000 mg daily Aspirin 81 mg daily -Current exercise habits: water aerobics few times a week -Educated on Cholesterol goals; Benefits of statin for ASCVD risk reduction; -Discussed gemfibrozil may increase risk for myopathy with rosuvastatin; pt currently tolerating reasonably well, he has significant hypertriglyceridemia and requires multiple therapies for this -Assessed pt finances - Lovaza is cost prohibitive, per insurance formulary it is Tier 3 (along with Vascepa); gemfibrozil is tier 2; can attempt tier exception -Recommended to continue current medication; pursue tier exception for Lovaza generic  Diabetes (A1c goal <7%) -Not ideally controlled - recent A1c at goal; however pt reports BG has been elevated since hospitalization for appendicitis (BG normally 100-200, now 200-300); he has not contacted his endocrinologist because he is in process of re-establishing with endocrinologist (his previous Dr moved offices, he is temporarily under care of different dr at same practice but has not met them yet, he is awaiting appt with previous dr at his new office) -Pt is also interested in having Ozempic pt assistance come through PCP office instead of [facility] endocrine -Current medications: Humulin R U-500 via insulin pump Ozempic 1 mg weekly Dexcom G6 -Medications previously tried: Lovie Macadamia (yeast infections)  -Current home glucose readings fasting glucose: ~200 post prandial glucose: 300 -Educated on A1c and blood sugar goals; -Coordinate with PCP for Ozempic pt assistance renewal for 2023 -Advised  patient to call endocrine for insulin pump adjustments; pt agreed to call  Heart Failure / HTN (Goal: BP < 130/80; prevent exacerbations) -Controlled - pt reports checking  BP 2-3 times a week; pt has just increased Entresto to BID; he has not started spironolactone yet; he does have history of low BP in s/o dehydration/diarrhea; pt denies recent swelling/SOB -Current home B readings: 120/75 -Last ejection fraction: 25%  -HF type: Systolic -NYHA Class: III (marked limitation of activity) -Current treatment: Furosemide 40 mg - 2 tab AM, 1 tab PM Metoprolol succinate 50 mg daily Entresto 97-103 mg - 1/2 tab BID Spironolocatone 12.5 mg daily - not restarted yet -Educated on Benefits of medications for managing symptoms and prolonging life -Recommended to continue current medication  Depression/Anxiety (Goal: manage symptoms) -Controlled - pt reports mood is well controlled, he is sleeping well -Current treatment: Escitalopram 20 mg daily Trazodone 100 mg - 2 tab HS -Medications previously tried/failed: duloxetine, sertraline, -PHQ9: 16 (02/2020) -GAD7: 12 (07/2019) -Connected with PCP for mental health support -Educated on Benefits of medication for symptom control -Recommended to continue current medication  Neuropathy (Goal: manage symptoms) -Controlled - pt reports gabapentin was started for leg pain; he does not endorse excess drowsiness/sedation -Current treatment  Gabapentin 600 mg - 2 tab BID -Recommended to continue current medication  Health Maintenance -Vaccine gaps: Covid booster, Shingrix -Pt reports he has side effects with first covid booster (chills); he reports he had both doses of Shingrix at [facility]  Patient Goals/Self-Care Activities Patient will:  - take medications as prescribed focus on medication adherence by pill box check glucose via Dexcom G6, document, and provide at future appointments check blood pressure daily, document, and provide at future appointments collaborate with provider on medication access solutions target a minimum of 150 minutes of moderate intensity exercise weekly -Call Endocrine office for insulin pump  adjustments in light of high sugars      Medication Assistance:  [place] - via [employer] endocrine ([facility] mails to pt) Humulin R U-500 - Community education officer - via [employer] endocrine [employer] - via cardiology  Compliance/Adherence/Medication fill history: Care Gaps: Shingrix - pt reports he got both doses at [facility] [employer] booster (due 10/12/20) Foot exam (due 02/03/21)  Star-Rating Drugs: Rosuvastatin - LF 04/20/21 x 90 ds  Patient's preferred pharmacy is:  [facility] [place], [place] [address] Phone: [phone] Fax: [phone]  Uses pill box? Yes Pt endorses 100% compliance  We discussed: Current pharmacy is preferred with insurance plan and patient is satisfied with pharmacy services Patient decided to: Continue current medication management strategy  Care Plan and Follow Up Patient Decision:  Patient agrees to Care Plan and Follow-up.  Plan: Telephone follow up appointment with care management team member scheduled for:  3 months  [name], PharmD, [place], CPP Clinical Pharmacist [facility] [phone]

## 2021-05-01 ENCOUNTER — Telehealth: Payer: Self-pay | Admitting: Primary Care

## 2021-05-01 DIAGNOSIS — G4733 Obstructive sleep apnea (adult) (pediatric): Secondary | ICD-10-CM

## 2021-05-01 NOTE — Telephone Encounter (Signed)
Faxed referral to general surgery [phone] at this time -appendicitis- also left message on nurse line [phone] number is [phone] let patient know that someone would be reaching out to schedule an appointment.

## 2021-05-02 DIAGNOSIS — Z794 Long term (current) use of insulin: Secondary | ICD-10-CM | POA: Diagnosis not present

## 2021-05-02 DIAGNOSIS — E118 Type 2 diabetes mellitus with unspecified complications: Secondary | ICD-10-CM | POA: Diagnosis not present

## 2021-05-02 DIAGNOSIS — G4730 Sleep apnea, unspecified: Secondary | ICD-10-CM | POA: Diagnosis not present

## 2021-05-02 NOTE — Telephone Encounter (Signed)
Called and spoke with pt who states he needs to have a new prescription for cpap tubing and also a new cpap mask sent to [facility]. Pt said that his mask is a Resmed Medium Full Face Mask.  Beth, please advise if you are okay with [place] sending this in for pt.

## 2021-05-02 NOTE — Telephone Encounter (Signed)
Order has been placed for tubing and new mask. Called and spoke with pt letting him know this had been done and he verbalized understanding. Nothing further needed.

## 2021-05-02 NOTE — Telephone Encounter (Signed)
Yes ok with sending in order for cpap tubing and mask

## 2021-05-05 DIAGNOSIS — E118 Type 2 diabetes mellitus with unspecified complications: Secondary | ICD-10-CM | POA: Diagnosis not present

## 2021-05-05 DIAGNOSIS — Z794 Long term (current) use of insulin: Secondary | ICD-10-CM | POA: Diagnosis not present

## 2021-05-05 DIAGNOSIS — G4730 Sleep apnea, unspecified: Secondary | ICD-10-CM | POA: Diagnosis not present

## 2021-05-09 ENCOUNTER — Ambulatory Visit
Admission: RE | Admit: 2021-05-09 | Discharge: 2021-05-09 | Disposition: A | Discharge status: Home / Self Care | Payer: Medicare Other | Source: Ambulatory Visit | Attending: Family Medicine | Admitting: Family Medicine

## 2021-05-09 ENCOUNTER — Encounter: Payer: Self-pay | Admitting: Family Medicine

## 2021-05-09 ENCOUNTER — Other Ambulatory Visit: Payer: Self-pay

## 2021-05-09 ENCOUNTER — Ambulatory Visit (INDEPENDENT_AMBULATORY_CARE_PROVIDER_SITE_OTHER): Payer: Medicare Other | Admitting: Family Medicine

## 2021-05-09 ENCOUNTER — Ambulatory Visit
Admission: RE | Admit: 2021-05-09 | Discharge: 2021-05-09 | Disposition: A | Discharge status: Home / Self Care | Payer: Medicare Other | Attending: Family Medicine | Admitting: Family Medicine

## 2021-05-09 ENCOUNTER — Encounter: Payer: Self-pay | Admitting: Internal Medicine

## 2021-05-09 ENCOUNTER — Ambulatory Visit (INDEPENDENT_AMBULATORY_CARE_PROVIDER_SITE_OTHER): Payer: Medicare Other

## 2021-05-09 VITALS — BP 118/70 | HR 81 | Temp 97.0°F | Wt 269.3 lb

## 2021-05-09 DIAGNOSIS — F331 Major depressive disorder, recurrent, moderate: Secondary | ICD-10-CM

## 2021-05-09 DIAGNOSIS — E1142 Type 2 diabetes mellitus with diabetic polyneuropathy: Secondary | ICD-10-CM | POA: Diagnosis not present

## 2021-05-09 DIAGNOSIS — M545 Low back pain, unspecified: Secondary | ICD-10-CM | POA: Diagnosis not present

## 2021-05-09 DIAGNOSIS — F411 Generalized anxiety disorder: Secondary | ICD-10-CM | POA: Diagnosis not present

## 2021-05-09 DIAGNOSIS — I255 Ischemic cardiomyopathy: Secondary | ICD-10-CM | POA: Diagnosis not present

## 2021-05-09 LAB — CUP PACEART REMOTE DEVICE CHECK
Battery Remaining Longevity: 44 mo
Battery Remaining Percentage: 47 %
Battery Voltage: 2.9 V
Brady Statistic AP VP Percent: 1 %
Brady Statistic AP VS Percent: 1 %
Brady Statistic AS VP Percent: 15 %
Brady Statistic AS VS Percent: 84 %
Brady Statistic RA Percent Paced: 1 %
Brady Statistic RV Percent Paced: 15 %
Date Time Interrogation Session: 20220823072921
HighPow Impedance: 41 Ohm
HighPow Impedance: 41 Ohm
Implantable Lead Implant Date: 20160929
Implantable Lead Implant Date: 20160929
Implantable Lead Location: 753859
Implantable Lead Location: 753860
Implantable Lead Model: 7071
Implantable Pulse Generator Implant Date: 20160929
Lead Channel Impedance Value: 380 Ohm
Lead Channel Impedance Value: 410 Ohm
Lead Channel Pacing Threshold Amplitude: 0.75 V
Lead Channel Pacing Threshold Amplitude: 1 V
Lead Channel Pacing Threshold Pulse Width: 0.4 ms
Lead Channel Pacing Threshold Pulse Width: 0.5 ms
Lead Channel Sensing Intrinsic Amplitude: 11.7 mV
Lead Channel Sensing Intrinsic Amplitude: 3.6 mV
Lead Channel Setting Pacing Amplitude: 2 V
Lead Channel Setting Pacing Amplitude: 2.5 V
Lead Channel Setting Pacing Pulse Width: 0.5 ms
Lead Channel Setting Sensing Sensitivity: 0.5 mV
Pulse Gen Serial Number: 7306375

## 2021-05-09 MED ORDER — METHOCARBAMOL 500 MG PO TABS: 500.0000 mg | ORAL_TABLET | Freq: Four times a day (QID) | ORAL | 1 refills | Status: DC | PRN

## 2021-05-09 MED ORDER — GABAPENTIN 600 MG PO TABS: 1200.0000 mg | ORAL_TABLET | Freq: Two times a day (BID) | ORAL | Status: DC

## 2021-05-09 MED ORDER — ESCITALOPRAM OXALATE 20 MG PO TABS: ORAL_TABLET | ORAL | Status: DC

## 2021-05-09 NOTE — Patient Instructions (Signed)
I would get the xray done and we'll call about seeing the spine clinic.   [place]  I would try an extra gabapentin midday.   If that doesn't doesn't help then you could add on methocarbamol.   Take care.  Glad to see you.

## 2021-05-09 NOTE — Progress Notes (Signed)
This visit occurred during the SARS-CoV-2 public health emergency.  Safety protocols were in place, including screening questions prior to the visit, additional usage of staff PPE, and extensive cleaning of exam room while observing appropriate contact time as indicated for disinfecting solutions.  Back pain. Recent inpatient tx with abx for appendicitis.  Back pain worse since last night but going on for ~3 weeks. He has h/o a sore back at baseline, episodically, "but this has been hanging around".  This doesn't feel like appendicitis pain.  B lower back pain than radiates to the B flank.  Pain doesn't radiate down the legs.  Standing at the sink causes tingling in the legs, that is new in the last few days and that gets better with sitting down.    Prev CT with degenerative changes of the lumbar spine. Marked canal stenosis at L4-L5.  Has been on gabapentin at baseline for leg pain, with relief.  Pain was worse laying down, better in a chair.  That was his prev baseline, before his symptoms got worse as above.  Sugars have been around 250-300 recently.  H/o back surgery in his ~20s for a herniated disc.  Per patient, AICD isn't MR compatible.    No FCNAVD.  No diarrhea, no blood in stool.  No pain on urination.    Meds, vitals, and allergies reviewed.   ROS: Per HPI unless specifically indicated in ROS section   Nad Uncomfortable from pain Ncat Neck supple, no LA Rrr Ctab Abd soft, not ttp.  S/S grossly wnl BLE R SLR positive for back pain, neg L SLR.   Able to bear weight.  30 minutes were devoted to patient care in this encounter (this includes time spent reviewing the patient's file/history, interviewing and examining the patient, counseling/reviewing plan with patient).

## 2021-05-10 ENCOUNTER — Encounter: Payer: Self-pay | Admitting: Family Medicine

## 2021-05-10 DIAGNOSIS — M545 Low back pain, unspecified: Secondary | ICD-10-CM | POA: Insufficient documentation

## 2021-05-10 DIAGNOSIS — E113393 Type 2 diabetes mellitus with moderate nonproliferative diabetic retinopathy without macular edema, bilateral: Secondary | ICD-10-CM | POA: Diagnosis not present

## 2021-05-10 DIAGNOSIS — Z961 Presence of intraocular lens: Secondary | ICD-10-CM | POA: Diagnosis not present

## 2021-05-10 DIAGNOSIS — E083313 Diabetes mellitus due to underlying condition with moderate nonproliferative diabetic retinopathy with macular edema, bilateral: Secondary | ICD-10-CM | POA: Diagnosis not present

## 2021-05-10 DIAGNOSIS — H524 Presbyopia: Secondary | ICD-10-CM | POA: Diagnosis not present

## 2021-05-10 NOTE — Assessment & Plan Note (Addendum)
He has baseline back pain with recent imaging showing canal stenosis at L4-L5 and that would explain some of his previous symptoms.  He has more pain in the meantime in the bilateral flank that does not radiate down the legs.  He has no tingling in the legs when standing could be explained by canal stenosis.  They gets better when he sitting down.  Discussed options.  I am seeing him for the first time today and we talked about options.  He is not enthused about surgery but I think it makes sense to see the spine clinic.  I had already put in the referral when I realized that he had his pain clinic evaluation pending for later this week.  We talked about it and it makes sense for him to go for pain clinic evaluation first.  His previous pain clinic appointment had to be deferred because he had unfortunately developed appendicitis.  His current pain does not feel like previous appendicitis episode and his abdomen is not tender, especially in the right lower quadrant.  It makes sense to recheck plain films to make sure he does not have a new compression fracture.  See notes on imaging.  We talked about pain med options.  He wanted to try a muscle relaxer.  I am not highly optimistic that would make a difference in his pain.  I thought it was reasonable to try an extra dose of gabapentin midday to see if that made any difference in the meantime and then he can add on methocarbamol on top of that if needed.  Routine sedation caution given to patient.  I was glad to see him in clinic and I hope he gets relief in the near future.  Routed to PCP as FYI.

## 2021-05-11 ENCOUNTER — Ambulatory Visit
Payer: Medicare Other | Attending: Student in an Organized Health Care Education/Training Program | Admitting: Student in an Organized Health Care Education/Training Program

## 2021-05-11 ENCOUNTER — Other Ambulatory Visit: Payer: Self-pay

## 2021-05-11 ENCOUNTER — Encounter: Payer: Self-pay | Admitting: Student in an Organized Health Care Education/Training Program

## 2021-05-11 ENCOUNTER — Telehealth: Payer: Self-pay

## 2021-05-11 VITALS — BP 112/67 | HR 81 | Temp 96.8°F | Ht 72.0 in | Wt 265.0 lb

## 2021-05-11 DIAGNOSIS — M47816 Spondylosis without myelopathy or radiculopathy, lumbar region: Secondary | ICD-10-CM | POA: Diagnosis not present

## 2021-05-11 DIAGNOSIS — G894 Chronic pain syndrome: Secondary | ICD-10-CM | POA: Diagnosis not present

## 2021-05-11 DIAGNOSIS — G8929 Other chronic pain: Secondary | ICD-10-CM | POA: Diagnosis not present

## 2021-05-11 DIAGNOSIS — M961 Postlaminectomy syndrome, not elsewhere classified: Secondary | ICD-10-CM | POA: Diagnosis not present

## 2021-05-11 DIAGNOSIS — M51369 Other intervertebral disc degeneration, lumbar region without mention of lumbar back pain or lower extremity pain: Secondary | ICD-10-CM

## 2021-05-11 DIAGNOSIS — M533 Sacrococcygeal disorders, not elsewhere classified: Secondary | ICD-10-CM | POA: Diagnosis not present

## 2021-05-11 DIAGNOSIS — E1142 Type 2 diabetes mellitus with diabetic polyneuropathy: Secondary | ICD-10-CM | POA: Diagnosis not present

## 2021-05-11 DIAGNOSIS — M5136 Other intervertebral disc degeneration, lumbar region: Secondary | ICD-10-CM | POA: Diagnosis not present

## 2021-05-11 MED ORDER — PREGABALIN 75 MG PO CAPS: ORAL_CAPSULE | ORAL | 0 refills | Status: DC

## 2021-05-11 NOTE — Progress Notes (Signed)
Safety precautions to be maintained throughout the outpatient stay will include: orient to surroundings, keep bed in low position, maintain call bell within reach at all times, provide assistance with transfer out of bed and ambulation.  

## 2021-05-11 NOTE — Progress Notes (Signed)
Patient: Brian Williams  Service Category: E/M  Provider: Gillis Santa, MD  DOB: 15-Dec-1960  DOS: 05/11/2021  Referring Provider: Pleas Koch, NP  MRN: [id number]  Setting: Ambulatory outpatient  PCP: Pleas Koch, NP  Type: New Patient  Specialty: Interventional Pain Management    Location: Office  Delivery: Face-to-face     Primary Reason(s) for Visit: Encounter for initial evaluation of one or more chronic problems (new to examiner) potentially causing chronic pain, and posing a threat to normal musculoskeletal function. (Level of risk: High) CC: Back Pain (lower) and Knee Pain (bilateral)  HPI  Brian Williams is a 60 y.o. year old, male patient, who comes for the first time to our practice referred by Pleas Koch, NP for our initial evaluation of his chronic pain. He has ICD (implantable cardioverter-defibrillator) in place; Congestive dilated cardiomyopathy ([place]); Diabetes mellitus type 2, uncontrolled, with complications ([place]); Chronic back pain; Essential hypertension; Hyperlipidemia; Generalized anxiety disorder; Hidradenitis suppurativa; Chronic systolic heart failure ([place]); OSA on CPAP; Diabetes, polyneuropathy ([place]); Unstable angina ([place]); Atherosclerosis of native coronary artery of native heart with stable angina pectoris ([place]); History of coronary artery stent placement; Coronary artery disease; Obesity (BMI 30-39.9); Hx of CABG; Major depressive disorder; Erectile dysfunction; Myalgia; Chronic knee pain; Sinusitis, acute; Dizziness; Diarrhea; Hypotension; Acute appendicitis; Depression; Diabetic neuropathy associated with type 2 diabetes mellitus ([place]); Falls, initial encounter; Leg pain, bilateral; Sensory ataxia; Tingling; Vision changes; Low back pain; Lumbar degenerative disc disease; Lumbar facet arthropathy; Lumbar post-laminectomy syndrome; Chronic pain syndrome; and Chronic SI joint pain on their problem list. Today he comes in for evaluation of his Back Pain (lower) and  Knee Pain (bilateral)  Pain Assessment: Location: Right, Left, Lower Back (surgery on back 30 years ago.) Radiating: states legs tingle when prolonged standing Onset: More than a month ago Duration: Chronic pain Quality: Aching, Constant, Cramping, Discomfort, Sharp, Stabbing Severity: 8 /10 (subjective, self-reported pain score)  Effect on ADL: Prolonged Timing: Constant Modifying factors: "Nothing really helping right now" BP: 112/67  HR: 81  Onset and Duration: Present longer than 3 months Cause of pain: Unknown Severity: No change since onset, NAS-11 at its worse: 8/10, NAS-11 at its best: 4/10, NAS-11 now: 6/10, and NAS-11 on the average: 6/10 Timing: Not influenced by the time of the day, During activity or exercise, and After activity or exercise Aggravating Factors: Bending, Kneeling, Lifiting, Motion, Prolonged sitting, Prolonged standing, Squatting, Stooping , Walking, Walking uphill, and Walking downhill Alleviating Factors: Medications and Sitting Associated Problems: Day-time cramps, Depression, Erectile dysfunction, Spasms, and Pain that does not allow patient to sleep Quality of Pain: Agonizing, Cramping, Dreadful, Horrible, Punishing, Sharp, Stabbing, and Throbbing Previous Examinations or Tests: X-rays and Orthopedic evaluation Previous Treatments: The patient denies any previous treatments  Brian Williams is a pleasant 60 year old male who presents with a chief complaint of low back pain most pronounced on his right side.  Occasionally he also has pain radiation down his right leg in a dermatomal fashion.  He states that he has had lumbar spine surgery approximately 30 years ago at [facility] in [place].  Of note he has a significant cardiac history, he has had cardiac bypass surgery for significant coronary artery disease.  He has a defibrillator in place as well as pacemaker.  He is currently anticoagulated with Plavix.  He is stopped this medication in  the past for procedures.  He has been on gabapentin for his lower extremity neuropathic pain.  He states that  this has become less effective.  He has been on this medication for over 1 year.  He denies having done physical therapy.   X-rays below show severe, advanced lumbar facet arthropathy, lumbar degenerative disc disease.  He he is a type II diabetic and currently has an insulin pump in place.  A1c less than 7.  He is currently not on any opioid analgesics.  PMP checked and appropriate.  Currently not on any chronic opioid therapy.  Meds   Current Outpatient Medications:    aspirin EC 81 MG tablet, Take 81 mg by mouth daily. , Disp: 90 tablet, Rfl:    clopidogrel (PLAVIX) 75 MG tablet, TAKE ONE TABLET BY MOUTH DAILY, Disp: 90 tablet, Rfl: 1   escitalopram (LEXAPRO) 20 MG tablet, TAKE 1 TABLET BY MOUTH  DAILY FOR ANXIETY AND  DEPRESSION, Disp: , Rfl:    ezetimibe (ZETIA) 10 MG tablet, TAKE 1 TABLET BY MOUTH  DAILY, Disp: 90 tablet, Rfl: 2   furosemide (LASIX) 40 MG tablet, Take 80 mg in the am & 40 mg in the pm., Disp: , Rfl:    gemfibrozil (LOPID) 600 MG tablet, TAKE 1 TABLET BY MOUTH  DAILY, Disp: 90 tablet, Rfl: 3   insulin regular human CONCENTRATED (HUMULIN R) 500 UNIT/ML injection, Inject 0-25 Units into the skin daily at 12 noon., Disp: , Rfl:    methocarbamol (ROBAXIN) 500 MG tablet, Take 1 tablet (500 mg total) by mouth every 6 (six) hours as needed for muscle spasms., Disp: 30 tablet, Rfl: 1   metoprolol succinate (TOPROL-XL) 50 MG 24 hr tablet, Take 50 mg by mouth daily., Disp: , Rfl:    nitroGLYCERIN (NITROSTAT) 0.4 MG SL tablet, Place 1 tablet (0.4 mg total) under the tongue every 5 (five) minutes as needed for chest pain., Disp: 25 tablet, Rfl: 1   omega-3 acid ethyl esters (LOVAZA) 1 g capsule, Take 2 capsules by mouth 2 (two) times daily., Disp: , Rfl:    pregabalin (LYRICA) 75 MG capsule, 75 mg BID for first 3 weeks, if no side effects, increase to 75 mg TID, Disp: 90  capsule, Rfl: 0   rosuvastatin (CRESTOR) 20 MG tablet, TAKE 1 TABLET BY MOUTH AT  BEDTIME FOR CHOLESTEROL, Disp: 90 tablet, Rfl: 3   sacubitril-valsartan (ENTRESTO) 97-103 MG, Take 0.5 tablets by mouth 2 (two) times daily., Disp: , Rfl:    Semaglutide, 1 MG/DOSE, (OZEMPIC, 1 MG/DOSE,) 4 MG/3ML SOPN, Inject 1 mg into the skin once a week., Disp: , Rfl:    sildenafil (REVATIO) 20 MG tablet, Take 2 to 5 tablets by mouth 1 hour prior to intercourse as needed., Disp: 50 tablet, Rfl: 0   traZODone (DESYREL) 100 MG tablet, TAKE 2 TABLETS BY MOUTH  EVERY NIGHT AT BEDTIME, Disp: 180 tablet, Rfl: 3  Imaging Review   Narrative CLINICAL DATA:  Bilateral neck pain  EXAM: CERVICAL SPINE - COMPLETE 4+ VIEW  COMPARISON:  None.  FINDINGS: There is straightening of the cervical spine. No acute fracture or listhesis of the cervical spine. Vertebral body height is preserved. There is endplate remodeling and degenerative annular calcifications noted at C2-C7 in keeping with changes of diffuse mild degenerative disc disease. The lateral masses of C1 are well aligned with the body of C2. The spinal canal is widely patent. The prevertebral soft tissues are not thickened.  IMPRESSION: Mild diffuse degenerative disc disease.   Electronically Signed By: Fidela Salisbury MD On: 11/22/2020 02:18 DG Lumbar Spine Complete  Narrative CLINICAL DATA:  Back pain.  EXAM: LUMBAR SPINE - COMPLETE 4+ VIEW  COMPARISON:  CT 04/03/2021.  Lumbar spine series 01/14/2020.  FINDINGS: Diffuse multilevel degenerative change lumbar spine again noted. No acute abnormality identified. No evidence of fracture. Aortoiliac atherosclerotic vascular calcification. Surgical clips right abdomen.  IMPRESSION: 1. Diffuse multilevel degenerative change again noted. No acute bony abnormality identified.  2.  Aortoiliac atherosclerotic vascular disease.   Electronically Signed By: [name]  Register M.D. CLINICAL DATA:  [place]  in house today. Left hip injury and pain. [place]. Initial encounter.  EXAM: DG HIP (WITH OR WITHOUT PELVIS) 2-3V LEFT  COMPARISON:  None.  FINDINGS: There is no evidence of hip fracture or dislocation. Mild degenerative spurring of the left acetabulum seen without joint space narrowing. No other significant bone abnormality identified.  IMPRESSION: No acute findings.  Mild left hip degenerative spurring.   Electronically Signed By: Earle Gell M.D. On: 11/09/2015 20:42  Complexity Note: Imaging results reviewed. Results shared with Brian Williams, using Layman's terms.                         ROS  Cardiovascular: Heart trouble, Abnormal heart rhythm, Daily Aspirin intake, Heart attack ( Date: 2008), Heart surgery, Pacemaker or defibrillator, Heart failure, Heart catheterization, and Blood thinners:  Anticoagulant Pulmonary or Respiratory: Temporary stoppage of breathing during sleep Neurological: No reported neurological signs or symptoms such as seizures, abnormal skin sensations, urinary and/or fecal incontinence, being born with an abnormal open spine and/or a tethered spinal cord Psychological-Psychiatric: Depressed and Difficulty sleeping and or falling asleep Gastrointestinal: Reflux or heatburn Genitourinary: No reported renal or genitourinary signs or symptoms such as difficulty voiding or producing urine, peeing blood, non-functioning kidney, kidney stones, difficulty emptying the bladder, difficulty controlling the flow of urine, or chronic kidney disease Hematological: No reported hematological signs or symptoms such as prolonged bleeding, low or poor functioning platelets, bruising or bleeding easily, hereditary bleeding problems, low energy levels due to low hemoglobin or being anemic Endocrine: High blood sugar controlled without the use of insulin (NIDDM) Rheumatologic: No reported rheumatological signs and symptoms such as fatigue, joint pain, tenderness, swelling,  redness, heat, stiffness, decreased range of motion, with or without associated rash Musculoskeletal: Negative for myasthenia gravis, muscular dystrophy, multiple sclerosis or malignant hyperthermia Work History: Disabled  Allergies  Brian Williams is allergic to naproxen and morphine and related.  Laboratory Chemistry Profile   Renal Lab Results  Component Value Date   BUN 19 04/06/2021   CREATININE 0.89 04/06/2021   BCR 23 (H) 04/05/2020   GFR 81.43 07/22/2020   GFRAA 111 04/05/2020   GFRNONAA >60 04/06/2021   SPECGRAV 1.020 04/03/2021   PHUR 5.5 04/03/2021   PROTEINUR NEGATIVE 04/03/2021     Electrolytes Lab Results  Component Value Date   NA 139 04/06/2021   K 3.5 04/06/2021   CL 102 04/06/2021   CALCIUM 9.2 04/06/2021   MG 2.0 04/06/2021   PHOS 5.7 (H) 12/07/2020     Hepatic Lab Results  Component Value Date   AST 16 04/04/2021   ALT 18 04/04/2021   ALBUMIN 3.7 04/04/2021   ALKPHOS 30 (L) 04/04/2021   LIPASE 25 04/03/2021     ID Lab Results  Component Value Date   HIV Non Reactive 12/07/2020   [place] NEGATIVE 04/03/2021   STAPHAUREUS NEGATIVE 05/22/2016   MRSAPCR NEGATIVE 05/22/2016   HCVAB NEGATIVE 10/06/2015     Bone No results found for: [place], [place], [id number], [id number],  25OHVITD1, 25OHVITD2, 25OHVITD3, TESTOFREE, TESTOSTERONE   Endocrine Lab Results  Component Value Date   GLUCOSE 125 (H) 04/06/2021   GLUCOSEU 50 (A) 04/03/2021   HGBA1C 6.5 02/24/2021   TSH 3.804 12/07/2020     Neuropathy Lab Results  Component Value Date   VITAMINB12 293 12/06/2020   FOLATE 12.0 12/06/2020   HGBA1C 6.5 02/24/2021   HIV Non Reactive 12/07/2020     CNS No results found for: COLORCSF, APPEARCSF, RBCCOUNTCSF, WBCCSF, POLYSCSF, LYMPHSCSF, EOSCSF, PROTEINCSF, GLUCCSF, JCVIRUS, CSFOLI, IGGCSF, LABACHR, ACETBL, LABACHR, ACETBL   Inflammation (CRP: Acute  ESR: Chronic) Lab Results  Component Value Date   CRP 1.1 (H) 12/07/2020   LATICACIDVEN  1.0 12/06/2020     Rheumatology No results found for: RF, ANA, LABURIC, URICUR, LYMEIGGIGMAB, LYMEABIGMQN, HLAB27   Coagulation Lab Results  Component Value Date   INR 1.25 05/24/2016   LABPROT 15.8 (H) 05/24/2016   APTT 31 05/24/2016   PLT 140 (L) 04/06/2021     Cardiovascular Lab Results  Component Value Date   BNP 56.9 04/03/2021   CKTOTAL 43 (L) 12/06/2020   TROPONINI <0.03 07/26/2016   HGB 9.9 (L) 04/06/2021   HCT 28.9 (L) 04/06/2021     Screening Lab Results  Component Value Date   SARSCOV2NAA NEGATIVE 04/03/2021   STAPHAUREUS NEGATIVE 05/22/2016   MRSAPCR NEGATIVE 05/22/2016   HCVAB NEGATIVE 10/06/2015   HIV Non Reactive 12/07/2020     Cancer No results found for: CEA, CA125, LABCA2   Allergens No results found for: ALMOND, APPLE, ASPARAGUS, AVOCADO, BANANA, BARLEY, BASIL, BAYLEAF, GREENBEAN, LIMABEAN, WHITEBEAN, BEEFIGE, REDBEET, BLUEBERRY, BROCCOLI, CABBAGE, MELON, CARROT, CASEIN, CASHEWNUT, CAULIFLOWER, CELERY     Note: Lab results reviewed.  [place]  Drug: Brian Williams  reports no history of drug use. Alcohol:  reports no history of alcohol use. Tobacco:  reports that he quit smoking about 2 years ago. His smoking use included cigarettes. He has a 25.00 pack-year smoking history. He has never used smokeless tobacco. Medical:  has a past medical history of AICD (automatic cardioverter/defibrillator) present, Allergy, Anxiety, Appendicitis, Arthritis, Back pain, Cardiomyopathy ([place]), CHF (congestive heart failure) ([place]), Coronary artery disease, Depression, Diabetes mellitus without complication ([place]), GERD (gastroesophageal reflux disease), Headache, History of bronchitis, History of colon polyps, History of hiatal hernia, Hyperlipidemia, Hypertension, MI (myocardial infarction) ([place]) (2001), Obesity, Peripheral neuropathy, Pneumonia, PONV (postoperative nausea and vomiting), Presence of permanent cardiac pacemaker, Shortness of breath dyspnea, Sleep apnea, and  Ventricular tachycardia ([place]). Family: family history includes Heart attack (age of onset: 61) in his mother; Heart attack (age of onset: 8) in his father; Heart disease in his maternal grandmother and maternal uncle; Hypertension in his father, maternal uncle, and mother; Melanoma in his mother; Stroke in his maternal grandmother.  Past Surgical History:  Procedure Laterality Date   BACK SURGERY     CARDIAC CATHETERIZATION     CARDIAC CATHETERIZATION Left 05/10/2016   Procedure: Left Heart Cath and Coronary Angiography;  Surgeon: Minna Merritts, MD;  Location: [facility];  Service: Cardiovascular;  Laterality: Left;   CARDIAC DEFIBRILLATOR PLACEMENT  10/16/2007   ICD Model number 2207-36 serial number [id number]   CARDIAC ELECTROPHYSIOLOGY STUDY AND ABLATION     CHOLECYSTECTOMY  2001   COLONOSCOPY     COLONOSCOPY WITH PROPOFOL N/A 08/28/2018   Procedure: COLONOSCOPY WITH PROPOFOL;  Surgeon: Jonathon Bellows, MD;  Location: [facility] ENDOSCOPY;  Service: Gastroenterology;  Laterality: N/A;   CORONARY ANGIOPLASTY WITH STENT PLACEMENT     7 stents  CORONARY ARTERY BYPASS GRAFT N/A 05/24/2016   Procedure: CORONARY ARTERY BYPASS GRAFTING (CABG) x four , using left internal mammary artery and left leg greater saphenous vein harvested endoscopically;  Surgeon: Gaye Pollack, MD;  Location: [facility];  Service: Open Heart Surgery;  Laterality: N/A;   EP IMPLANTABLE DEVICE N/A 06/16/2015   Procedure: ICD Generator Changeout;  Surgeon: Deboraha Sprang, MD;  Location: [facility];  Service: Cardiovascular;  Laterality: N/A;   ESOPHAGOGASTRODUODENOSCOPY     INSERT / REPLACE / REMOVE PACEMAKER     KNEE SURGERY     bilateral knee    [name] WITHOUT CARDIOVERSION N/A 05/24/2016   Procedure: TRANSESOPHAGEAL ECHOCARDIOGRAM ([name]);  Surgeon: Gaye Pollack, MD;  Location: [facility];  Service: Open Heart Surgery;  Laterality: N/A;   VASECTOMY     Active Ambulatory Problems    Diagnosis Date Noted   ICD (implantable  cardioverter-defibrillator) in place 04/20/2015   Congestive dilated cardiomyopathy ([place]) 04/20/2015   Diabetes mellitus type 2, uncontrolled, with complications ([place]) 14/48/1856   Chronic back pain 05/02/2015   Essential hypertension 05/02/2015   Hyperlipidemia 05/02/2015   Generalized anxiety disorder 05/02/2015   Hidradenitis suppurativa 31/49/7026   Chronic systolic heart failure ([place]) 09/30/2014   OSA on CPAP 01/06/2016   Diabetes, polyneuropathy ([place]) 01/06/2016   Unstable angina ([place]) 05/04/2016   Atherosclerosis of native coronary artery of native heart with stable angina pectoris ([place])    History of coronary artery stent placement    Coronary artery disease 05/24/2016   Obesity (BMI 30-39.9) 04/06/2016   Hx of CABG 07/26/2016   Major depressive disorder 07/08/2017   Erectile dysfunction 07/08/2017   Myalgia 01/28/2019   Chronic knee pain 07/25/2020   Sinusitis, acute 08/08/2020   Dizziness 11/23/2020   Diarrhea 12/06/2020   Hypotension 12/06/2020   Acute appendicitis 04/03/2021   Depression 04/03/2021   Diabetic neuropathy associated with type 2 diabetes mellitus ([place]) 08/05/2020   Falls, initial encounter 08/05/2020   Leg pain, bilateral 08/05/2020   Sensory ataxia 08/05/2020   Tingling 12/01/2020   Vision changes 12/01/2020   Low back pain 05/10/2021   Lumbar degenerative disc disease 05/11/2021   Lumbar facet arthropathy 05/11/2021   Lumbar post-laminectomy syndrome 05/11/2021   Chronic pain syndrome 05/11/2021   Chronic SI joint pain 05/11/2021   Resolved Ambulatory Problems    Diagnosis Date Noted   Preventative health care 10/06/2015   HLD (hyperlipidemia) 09/30/2014   Memory disturbance 01/06/2016   Insomnia 01/06/2016   Cerumen impaction 06/13/2016   Chest pain with moderate risk for cardiac etiology 07/26/2016   Dry skin 07/08/2018   Penile irritation 10/14/2019   Neck pain 11/21/2020   Photophobia 11/21/2020   Dehydration 12/06/2020   Past  Medical History:  Diagnosis Date   AICD (automatic cardioverter/defibrillator) present    Allergy    Anxiety    Appendicitis    Arthritis    Back pain    Cardiomyopathy ([place])    CHF (congestive heart failure) ([place])    Diabetes mellitus without complication ([place])    GERD (gastroesophageal reflux disease)    Headache    History of bronchitis    History of colon polyps    History of hiatal hernia    Hypertension    MI (myocardial infarction) ([place]) 2001   Obesity    Peripheral neuropathy    Pneumonia    PONV (postoperative nausea and vomiting)    Presence of permanent cardiac pacemaker    Shortness of  breath dyspnea    Sleep apnea    Ventricular tachycardia ([place])    Constitutional Exam  General appearance: Well nourished, well developed, and well hydrated. In no apparent acute distress Vitals:   05/11/21 1044  BP: 112/67  Pulse: 81  Temp: (!) 96.8 F (36 C)  SpO2: 99%  Weight: 265 lb (120.2 kg)  Height: 6' (1.829 m)   BMI Assessment: Estimated body mass index is 35.94 kg/m as calculated from the following:   Height as of this encounter: 6' (1.829 m).   Weight as of this encounter: 265 lb (120.2 kg).  BMI interpretation table: BMI level Category Range association with higher incidence of chronic pain  <18 kg/m2 Underweight   18.5-24.9 kg/m2 Ideal body weight   25-29.9 kg/m2 Overweight Increased incidence by 20%  30-34.9 kg/m2 Obese (Class I) Increased incidence by 68%  35-39.9 kg/m2 Severe obesity (Class II) Increased incidence by 136%  >40 kg/m2 Extreme obesity (Class III) Increased incidence by 254%   Patient's current BMI Ideal Body weight  Body mass index is 35.94 kg/m. Ideal body weight: 77.6 kg (171 lb 1.2 oz) Adjusted ideal body weight: 94.6 kg (208 lb 10.3 oz)   BMI Readings from Last 4 Encounters:  05/11/21 35.94 kg/m  05/09/21 36.52 kg/m  04/20/21 35.91 kg/m  04/04/21 35.22 kg/m   Wt Readings from Last 4 Encounters:  05/11/21 265 lb (120.2 kg)   05/09/21 269 lb 4.8 oz (122.2 kg)  04/20/21 264 lb 12.8 oz (120.1 kg)  04/04/21 259 lb 11.2 oz (117.8 kg)    Psych/Mental status: Alert, oriented x 3 (person, place, & time)       Eyes: PERLA Respiratory: No evidence of acute respiratory distress  Lumbar Spine Area Exam  Skin & Axial Inspection: Well healed scar from previous spine surgery detected Alignment: Symmetrical Functional ROM: Pain restricted ROM       Stability: No instability detected Muscle Tone/Strength: Functionally intact. No obvious neuro-muscular anomalies detected. Sensory (Neurological): Musculoskeletal pain pattern Palpation: No palpable anomalies       Provocative Tests: Hyperextension/rotation test: (+) bilaterally for facet joint pain. Lumbar quadrant test (Kemp's test): (+) bilaterally for facet joint pain.   Gait & Posture Assessment  Ambulation: Unassisted Gait: Relatively normal for age and body habitus Posture: WNL  Lower Extremity Exam    Side: Right lower extremity  Side: Left lower extremity  Stability: No instability observed          Stability: No instability observed          Skin & Extremity Inspection: Skin color, temperature, and hair growth are WNL. No peripheral edema or cyanosis. No masses, redness, swelling, asymmetry, or associated skin lesions. No contractures.  Skin & Extremity Inspection: Skin color, temperature, and hair growth are WNL. No peripheral edema or cyanosis. No masses, redness, swelling, asymmetry, or associated skin lesions. No contractures.  Functional ROM: Pain restricted ROM                  Functional ROM: Pain restricted ROM                  Muscle Tone/Strength: Functionally intact. No obvious neuro-muscular anomalies detected.  Muscle Tone/Strength: Functionally intact. No obvious neuro-muscular anomalies detected.  Sensory (Neurological): Neuropathic pain pattern        Sensory (Neurological): Neuropathic pain pattern        DTR: Patellar: deferred  today Achilles: deferred today Plantar: deferred today  DTR: Patellar: deferred today Achilles: deferred  today Plantar: deferred today  Palpation: No palpable anomalies  Palpation: No palpable anomalies    Assessment  Primary Diagnosis & Pertinent Problem List: The primary encounter diagnosis was Lumbar post-laminectomy syndrome. Diagnoses of Diabetic polyneuropathy associated with type 2 diabetes mellitus ([place]), Lumbar facet arthropathy, Chronic SI joint pain, Lumbar spondylosis, Lumbar degenerative disc disease, and Chronic pain syndrome were also pertinent to this visit.  Visit Diagnosis (New problems to examiner): 1. Lumbar post-laminectomy syndrome   2. Diabetic polyneuropathy associated with type 2 diabetes mellitus ([place])   3. Lumbar facet arthropathy   4. Chronic SI joint pain   5. Lumbar spondylosis   6. Lumbar degenerative disc disease   7. Chronic pain syndrome    Plan of Care (Initial workup plan)   General Recommendations: The pain condition that the patient suffers from is best treated with a multidisciplinary approach that involves an increase in physical activity to prevent de-conditioning and worsening of the pain cycle, as well as psychological counseling (formal and/or informal) to address the co-morbid psychological affects of pain. Treatment will often involve judicious use of pain medications and interventional procedures to decrease the pain, allowing the patient to participate in the physical activity that will ultimately produce long-lasting pain reductions. The goal of the multidisciplinary approach is to return the patient to a higher level of overall function and to restore their ability to perform activities of daily living.  Lab Orders         Compliance Drug Analysis, Ur       Referral Orders         Ambulatory referral to Physical Therapy       Pharmacotherapy (current): Medications ordered:  Meds ordered this encounter  Medications   pregabalin  (LYRICA) 75 MG capsule    Sig: 75 mg BID for first 3 weeks, if no side effects, increase to 75 mg TID    Dispense:  90 capsule    Refill:  0    Fill one day early if pharmacy is closed on scheduled refill date. May substitute for generic if available.   Medications administered during this visit: Johnaton Sonneborn. [place] had no medications administered during this visit.   Pharmacological management options:  Opioid Analgesics: The patient was informed that there is no guarantee that he would be a candidate for opioid analgesics. The decision will be made following CDC guidelines. This decision will be based on the results of diagnostic studies, as well as Brian Williams's risk profile.  Avoid at this time.  Membrane stabilizer: Has been on gabapentin for over a year with limited response.  Trial of Lyrica as above.  Muscle relaxant: Adequate regimen on Robaxin can continue as needed.  NSAID: Medically contraindicated patient on Plavix.  Other analgesic(s): To be determined at a later time   Interventional management options: Brian Williams was informed that there is no guarantee that he would be a candidate for interventional therapies. The decision will be based on the results of diagnostic studies, as well as Brian Williams's risk profile.  Procedure(s) under consideration:  Diagnostic lumbar facet medial branch nerve blocks Sacroiliac joint injection, piriformis injection Spinal cord stimulator trial for failed back surgical syndrome Qutenza, capsaicin 8% topical system for diabetic polyneuropathy    Provider-requested follow-up: Return in about 6 weeks (around 06/22/2021) for Medication Management, in person (discuss fcts s/p PT).  I spent a total of 60 minutes reviewing chart data, face-to-face evaluation with the patient, counseling and coordination of care as detailed above.  Future Appointments  Date Time Provider Ethel  05/23/2021 11:00 AM Deboraha Sprang, MD [facility]   06/22/2021  9:20 AM Gillis Santa, MD [facility] None  07/21/2021  1:15 PM [facility] NURSE HEALTH ADVISOR [facility]  07/27/2021  9:00 AM Pleas Koch, NP [facility]  08/04/2021  1:00 PM [facility] PHARMACIST 2 [facility]  08/08/2021  7:15 AM [facility] DEVICE REMOTES [facility]  11/07/2021  7:15 AM [facility] DEVICE REMOTES [facility]    Note by: Gillis Santa, MD Date: 05/11/2021; Time: 1:58 PM

## 2021-05-11 NOTE — Telephone Encounter (Signed)
Left message for patient to return call to office. Wanted to see if anyone from [facility] has contacted him?

## 2021-05-11 NOTE — Telephone Encounter (Signed)
Spoke with Brian '@[facility]  -patient scheduled 06/09/2021. Brian Williams is also sending link to [facility] carelink to send and monitor referrals. [phone].

## 2021-05-12 LAB — HM DIABETES EYE EXAM

## 2021-05-13 DIAGNOSIS — H524 Presbyopia: Secondary | ICD-10-CM | POA: Diagnosis not present

## 2021-05-15 ENCOUNTER — Encounter: Payer: Self-pay | Admitting: Primary Care

## 2021-05-15 DIAGNOSIS — E118 Type 2 diabetes mellitus with unspecified complications: Secondary | ICD-10-CM | POA: Diagnosis not present

## 2021-05-15 DIAGNOSIS — Z794 Long term (current) use of insulin: Secondary | ICD-10-CM | POA: Diagnosis not present

## 2021-05-15 DIAGNOSIS — I2510 Atherosclerotic heart disease of native coronary artery without angina pectoris: Secondary | ICD-10-CM | POA: Diagnosis not present

## 2021-05-15 DIAGNOSIS — E668 Other obesity: Secondary | ICD-10-CM | POA: Diagnosis not present

## 2021-05-15 DIAGNOSIS — G4730 Sleep apnea, unspecified: Secondary | ICD-10-CM | POA: Diagnosis not present

## 2021-05-15 DIAGNOSIS — E7849 Other hyperlipidemia: Secondary | ICD-10-CM | POA: Diagnosis not present

## 2021-05-15 NOTE — Progress Notes (Signed)
a 

## 2021-05-16 ENCOUNTER — Telehealth: Payer: Self-pay | Admitting: Pharmacist

## 2021-05-16 NOTE — Progress Notes (Signed)
    Chronic Care Management Pharmacy Assistant   Name: Brian Williams  MRN: [id number] DOB: 07-Aug-1961  Made a call to [place] [facility] [phone]) to see if they have any dates on when patient last had shingrix vaccines. Pharmacist stated that there is not record file of patient having any shingrix vaccine there.   Ethelene Hal Clinical Pharmacist Assistant [phone]   Time spent:11

## 2021-05-17 ENCOUNTER — Encounter: Payer: Self-pay | Admitting: Student in an Organized Health Care Education/Training Program

## 2021-05-18 ENCOUNTER — Encounter: Payer: Self-pay | Admitting: Family Medicine

## 2021-05-18 NOTE — Telephone Encounter (Signed)
Please talk to me about this patient.  Thanks. 

## 2021-05-19 ENCOUNTER — Telehealth: Payer: Self-pay

## 2021-05-19 NOTE — Telephone Encounter (Signed)
Dr Holley Raring, this patient has sent in a my chart message stating that after a week of the Lyrica and Methacarbamol, he is not noticing any relief and would like to know if there is anything else he can take.  I also see where he reached out to his PCP telling them the same thing.  He has been here for 1 new patient visit.  His follow up visit is 06-22-2021.  Please advise.

## 2021-05-23 ENCOUNTER — Ambulatory Visit (INDEPENDENT_AMBULATORY_CARE_PROVIDER_SITE_OTHER): Payer: Medicare Other | Admitting: Internal Medicine

## 2021-05-23 ENCOUNTER — Other Ambulatory Visit: Payer: Self-pay

## 2021-05-23 VITALS — BP 118/60 | HR 68 | Ht 72.0 in | Wt 267.0 lb

## 2021-05-23 DIAGNOSIS — I255 Ischemic cardiomyopathy: Secondary | ICD-10-CM | POA: Diagnosis not present

## 2021-05-23 DIAGNOSIS — I442 Atrioventricular block, complete: Secondary | ICD-10-CM | POA: Diagnosis not present

## 2021-05-23 DIAGNOSIS — I5022 Chronic systolic (congestive) heart failure: Secondary | ICD-10-CM

## 2021-05-23 DIAGNOSIS — Z95810 Presence of automatic (implantable) cardiac defibrillator: Secondary | ICD-10-CM

## 2021-05-23 DIAGNOSIS — I420 Dilated cardiomyopathy: Secondary | ICD-10-CM

## 2021-05-23 DIAGNOSIS — I493 Ventricular premature depolarization: Secondary | ICD-10-CM

## 2021-05-23 NOTE — Progress Notes (Signed)
Patient Care Team: Pleas Koch, NP as PCP - General (Internal Medicine) Rockey Situ Kathlene November, MD as PCP - Cardiology (Cardiology) Minna Merritts, MD as Consulting Physician (Cardiology) Radene Knee Ladell Pier, MD as Consulting Physician (Cardiology) Jamelle Rushing, MD as Referring Physician (Dermatology) Charlton Haws, [facility] as Pharmacist (Pharmacist) Bryson Ha, OD (Optometry)   HPI  Brian Williams is a 60 y.o. male Seen in follow-up for ICD implanted for secondary prevention after holter demonstrated VT.  Also with PVCs, both for which he has undergone ablation 2013 @ [facility]  . His device reached ERI and underwent generator replacement 9/16.   History of ischemic heart disease w multiple caths and PC>> CABG 2017 ([place])     Followed previously at [facility] and then at [facility] and now at [facility].  Notes reviewed from spring 2017.     [facility] notes raised the possibility of ranolazine.  Per their follow-up, he is on DAPT  ? Appendicitis with ongoing consideration about removal  No chestpain, chronic DOE, no edema     Has STOPPED SMOKING and with this he has been eating more       DATE TEST EF   2015 Myoview 35 %   9/17 Echo([facility])  35%   9/17 Cath   Severe proximal LAD disease (ISR) Severe proximal and mid RAC disease Severe OM2 disease  Severe proximal diagonal #1 disease Moderate to severe left main disease  11/17 Myoview   28 % No Ischemia  6/19 Echo ([facility]) 25-30%   9/20 PET-stress 20-25% Fixed defects   10/20 LHC ([facility])  LIMA-LAD:p; SVG-DPA; SVG-OM  patent SVG-D known occluded   Date Cr K Mg Hgb  11/17  0./86 4.1 2.5 8.7>>12.2  9/18  0.8 3.7     11/19 0.99 4.1    5/20 0.83 4.0 2.1 11.6  6/21 0.80 Hemolyzed         Device History: ICD implanted  2009 generator replacement 2016 History of appropriate therapy: No History of AAD therapy: Yes    Past Medical History:  Diagnosis Date   AICD (automatic cardioverter/defibrillator) present     Allergy    Anxiety    takes Xanax as needed   Appendicitis    Arthritis    back,knees,right shoulder   Back pain    Cardiomyopathy ([place])    CHF (congestive heart failure) (HCC)    takes Lasix and Aldactone daily   Coronary artery disease    takes Plavix daily   Depression    takes Zoloft daily   Diabetes mellitus without complication (HCC)    Humulin R and Farxiga daily.Fasting blood sugar runs 140   GERD (gastroesophageal reflux disease)    Headache    History of bronchitis    History of colon polyps    benign   History of hiatal hernia    Hyperlipidemia    takes Fenofibrate,Crestor, and Zetia daily   Hypertension    takes Entresto and Coreg daily   MI (myocardial infarction) ([place]) 2001   Obesity    Peripheral neuropathy    Pneumonia    history of-last time about 14 yrs ago   PONV (postoperative nausea and vomiting)    after knee surgery 25 yrs ago b/p stayed elevated for a while   Presence of permanent cardiac pacemaker    Shortness of breath dyspnea    with exertion   Sleep apnea    uses CPAP nightly   Ventricular tachycardia ([place])  s/p RFCA PVCs 2013    Past Surgical History:  Procedure Laterality Date   BACK SURGERY     CARDIAC CATHETERIZATION     CARDIAC CATHETERIZATION Left 05/10/2016   Procedure: Left Heart Cath and Coronary Angiography;  Surgeon: Minna Merritts, MD;  Location: [place] [facility];  Service: Cardiovascular;  Laterality: Left;   CARDIAC DEFIBRILLATOR PLACEMENT  10/16/2007   ICD Model number 2207-36 serial number [id number]   CARDIAC ELECTROPHYSIOLOGY STUDY AND ABLATION     CHOLECYSTECTOMY  2001   COLONOSCOPY     COLONOSCOPY WITH PROPOFOL N/A 08/28/2018   Procedure: COLONOSCOPY WITH PROPOFOL;  Surgeon: Jonathon Bellows, MD;  Location: [facility] ENDOSCOPY;  Service: Gastroenterology;  Laterality: N/A;   CORONARY ANGIOPLASTY WITH STENT PLACEMENT     7 stents   CORONARY ARTERY BYPASS GRAFT N/A 05/24/2016   Procedure: CORONARY ARTERY BYPASS GRAFTING  (CABG) x four , using left internal mammary artery and left leg greater saphenous vein harvested endoscopically;  Surgeon: Gaye Pollack, MD;  Location: [place] OR;  Service: Open Heart Surgery;  Laterality: N/A;   EP IMPLANTABLE DEVICE N/A 06/16/2015   Procedure: ICD Generator Changeout;  Surgeon: Deboraha Sprang, MD;  Location: [place] CV LAB;  Service: Cardiovascular;  Laterality: N/A;   ESOPHAGOGASTRODUODENOSCOPY     INSERT / REPLACE / REMOVE PACEMAKER     KNEE SURGERY     bilateral knee    [name] WITHOUT CARDIOVERSION N/A 05/24/2016   Procedure: TRANSESOPHAGEAL ECHOCARDIOGRAM ([name]);  Surgeon: Gaye Pollack, MD;  Location: [place];  Service: Open Heart Surgery;  Laterality: N/A;   VASECTOMY      Current Outpatient Medications  Medication Sig Dispense Refill   aspirin EC 81 MG tablet Take 81 mg by mouth daily.  90 tablet    clopidogrel (PLAVIX) 75 MG tablet TAKE ONE TABLET BY MOUTH DAILY 90 tablet 1   escitalopram (LEXAPRO) 20 MG tablet TAKE 1 TABLET BY MOUTH  DAILY FOR ANXIETY AND  DEPRESSION     ezetimibe (ZETIA) 10 MG tablet TAKE 1 TABLET BY MOUTH  DAILY 90 tablet 2   furosemide (LASIX) 40 MG tablet Take 80 mg in the am & 40 mg in the pm.     gemfibrozil (LOPID) 600 MG tablet TAKE 1 TABLET BY MOUTH  DAILY 90 tablet 3   insulin regular human CONCENTRATED (HUMULIN R) 500 UNIT/ML injection Inject 0-25 Units into the skin daily at 12 noon.     methocarbamol (ROBAXIN) 500 MG tablet Take 1 tablet (500 mg total) by mouth every 6 (six) hours as needed for muscle spasms. 30 tablet 1   metoprolol succinate (TOPROL-XL) 50 MG 24 hr tablet Take 50 mg by mouth daily.     nitroGLYCERIN (NITROSTAT) 0.4 MG SL tablet Place 1 tablet (0.4 mg total) under the tongue every 5 (five) minutes as needed for chest pain. 25 tablet 1   omega-3 acid ethyl esters (LOVAZA) 1 g capsule Take 2 capsules by mouth 2 (two) times daily.     pregabalin (LYRICA) 75 MG capsule 75 mg BID for first 3 weeks, if no side effects, increase  to 75 mg TID 90 capsule 0   rosuvastatin (CRESTOR) 20 MG tablet TAKE 1 TABLET BY MOUTH AT  BEDTIME FOR CHOLESTEROL 90 tablet 3   sacubitril-valsartan (ENTRESTO) 97-103 MG Take 0.5 tablets by mouth 2 (two) times daily.     Semaglutide, 1 MG/DOSE, (OZEMPIC, 1 MG/DOSE,) 4 MG/3ML SOPN Inject 1 mg into the skin once a  week.     sildenafil (REVATIO) 20 MG tablet Take 2 to 5 tablets by mouth 1 hour prior to intercourse as needed. 50 tablet 0   traZODone (DESYREL) 100 MG tablet TAKE 2 TABLETS BY MOUTH  EVERY NIGHT AT BEDTIME 180 tablet 3   No current facility-administered medications for this visit.    Allergies  Allergen Reactions   Naproxen Other (See Comments), Anxiety and Rash    Causes hyperactivity causes hyperactivity   Morphine And Related Nausea And Vomiting    Morphine only      Review of Systems negative except from HPI and PMH  Physical Exam    BP 118/60 (BP Location: Right Arm, Patient Position: Sitting, Cuff Size: Large)   Pulse 68   Ht 6' (1.829 m)   Wt 267 lb (121.1 kg)   SpO2 98%   BMI 36.21 kg/m  Well developed and Morbidly obese in no acute distress HENT normal Neck supple  Clear Device pocket well healed; without hematoma or erythema.  There is no tethering  Regular rate and rhythm, no  gallop No / murmur Abd-soft with active BS No Clubbing cyanosis  edema Skin-warm and dry A & Oriented  Grossly normal sensory and motor function  ECG sinus @ 68 28/12/38      Assessment and  Plan  Ischemic cardiomyopathy  Implantable defibrillator-[employer]    History of inappropriate therapy  Tobacco use>>stopped!!!  Congestive heart failure-chronic-systolic  PVCs     Without angina,  continue metop 50 daily and DAPT   For cardiomyopathy, back on entresto and metop but not yet on aldactone  will defer to [facility]  No interval ventricular or atrial arrhythmias  16% V pacing so have increased AS-VP from 200>.225 msec  Discussed at length his concerns about  possible appendectomy

## 2021-05-23 NOTE — Patient Instructions (Signed)
Medication Instructions:  Your physician recommends that you continue on your current medications as directed. Please refer to the Current Medication list given to you today.  *If you need a refill on your cardiac medications before your next appointment, please call your pharmacy*   Lab Work: None Ordered  If you have labs (blood work) drawn today and your tests are completely normal, you will receive your results only by: [place] (if you have MyChart) OR A paper copy in the mail If you have any lab test that is abnormal or we need to change your treatment, we will call you to review the results.   Testing/Procedures: None Ordered   Follow-Up: At [facility], you and your health needs are our priority.  As part of our continuing mission to provide you with exceptional heart care, we have created designated Provider Care Teams.  These Care Teams include your primary Cardiologist (physician) and Advanced Practice Providers (APPs -  Physician Assistants and Nurse Practitioners) who all work together to provide you with the care you need, when you need it.  We recommend signing up for the patient portal called "MyChart".  Sign up information is provided on this After Visit Summary.  MyChart is used to connect with patients for Virtual Visits (Telemedicine).  Patients are able to view lab/test results, encounter notes, upcoming appointments, etc.  Non-urgent messages can be sent to your provider as well.   To learn more about what you can do with MyChart, go to [email or web address].    Your next appointment:   1 year(s)  The format for your next appointment:   In Person  Provider:   Virl Axe, MD   Other Instructions

## 2021-05-23 NOTE — Telephone Encounter (Signed)
Please put on schedule and notify patient.

## 2021-05-24 NOTE — Progress Notes (Signed)
Remote ICD transmission.   

## 2021-05-25 ENCOUNTER — Ambulatory Visit
Admission: RE | Admit: 2021-05-25 | Discharge: 2021-05-25 | Disposition: A | Discharge status: Home / Self Care | Payer: Medicare Other | Source: Ambulatory Visit | Attending: Student in an Organized Health Care Education/Training Program | Admitting: Student in an Organized Health Care Education/Training Program

## 2021-05-25 ENCOUNTER — Ambulatory Visit (HOSPITAL_BASED_OUTPATIENT_CLINIC_OR_DEPARTMENT_OTHER): Payer: Medicare Other | Admitting: Student in an Organized Health Care Education/Training Program

## 2021-05-25 ENCOUNTER — Other Ambulatory Visit: Payer: Self-pay

## 2021-05-25 ENCOUNTER — Encounter: Payer: Self-pay | Admitting: Student in an Organized Health Care Education/Training Program

## 2021-05-25 VITALS — BP 105/77 | HR 71 | Temp 97.2°F | Resp 18 | Ht 72.0 in | Wt 265.0 lb

## 2021-05-25 DIAGNOSIS — M25552 Pain in left hip: Secondary | ICD-10-CM

## 2021-05-25 DIAGNOSIS — G894 Chronic pain syndrome: Secondary | ICD-10-CM | POA: Insufficient documentation

## 2021-05-25 DIAGNOSIS — G8929 Other chronic pain: Secondary | ICD-10-CM

## 2021-05-25 DIAGNOSIS — M5136 Other intervertebral disc degeneration, lumbar region: Secondary | ICD-10-CM

## 2021-05-25 DIAGNOSIS — M25551 Pain in right hip: Secondary | ICD-10-CM

## 2021-05-25 DIAGNOSIS — M533 Sacrococcygeal disorders, not elsewhere classified: Secondary | ICD-10-CM | POA: Insufficient documentation

## 2021-05-25 DIAGNOSIS — E1142 Type 2 diabetes mellitus with diabetic polyneuropathy: Secondary | ICD-10-CM

## 2021-05-25 DIAGNOSIS — M47816 Spondylosis without myelopathy or radiculopathy, lumbar region: Secondary | ICD-10-CM | POA: Diagnosis not present

## 2021-05-25 DIAGNOSIS — M961 Postlaminectomy syndrome, not elsewhere classified: Secondary | ICD-10-CM | POA: Diagnosis not present

## 2021-05-25 DIAGNOSIS — M160 Bilateral primary osteoarthritis of hip: Secondary | ICD-10-CM | POA: Diagnosis not present

## 2021-05-25 MED ORDER — METHOCARBAMOL 750 MG PO TABS: 750.0000 mg | ORAL_TABLET | Freq: Three times a day (TID) | ORAL | 2 refills | Status: DC | PRN

## 2021-05-25 NOTE — Patient Instructions (Signed)
You may go downstairs to the Radiology Dept at any time to get you x-rays done.

## 2021-05-25 NOTE — Progress Notes (Signed)
PROVIDER NOTE: Information contained herein reflects review and annotations entered in association with encounter. Interpretation of such information and data should be left to medically-trained personnel. Information provided to patient can be located elsewhere in the medical record under "Patient Instructions". Document created using STT-dictation technology, any transcriptional errors that may result from process are unintentional.    Patient: Brian Williams  Service Category: E/M  Provider: Gillis Santa, MD  DOB: December 04, 1960  DOS: 05/25/2021  Specialty: Interventional Pain Management  MRN: [id number]  Setting: Ambulatory outpatient  PCP: Pleas Koch, NP  Type: Established Patient    Referring Provider: Pleas Koch, NP  Location: Office  Delivery: Face-to-face     HPI  Mr. TED GOODNER, a 60 y.o. year old male, is here today because of his Lumbar post-laminectomy syndrome [M96.1]. Mr. Rallis primary complain today is Back Pain and Hip Pain (bilateral) Last encounter: My last encounter with him was on 05/11/2021. Pertinent problems: Mr. Fohl has Low back pain; Lumbar degenerative disc disease; Lumbar facet arthropathy; Lumbar post-laminectomy syndrome; Chronic pain syndrome; and Chronic SI joint pain on their pertinent problem list. Pain Assessment: Severity of Chronic pain is reported as a 7 /10. Location: Back Lower/both upper legs. Onset: More than a month ago. Quality: Hervey Ard, Aching. Timing: Constant. Modifying factor(s): nothing. Vitals:  height is 6' (1.829 m) and weight is 265 lb (120.2 kg). His temporal temperature is 97.2 F (36.2 C) (abnormal). His blood pressure is 105/77 and his pulse is 71. His respiration is 18 and oxygen saturation is 100%.   Reason for encounter:   Curt presents today for his second patient visit.  At his last visit, I recommended physical therapy and transition from gabapentin to Lyrica.  He tried Lyrica for 1 week and states that it was not  effective and transitioned back to gabapentin.  He does find benefit with Robaxin which he takes as needed for musculoskeletal pain and muscle spasms.  He is having increased bilateral hip pain, left greater than right.  It does wrap around to his trochanteric bursa on the left.  I will obtain x-rays of bilateral SI joint, bilateral hips.  After reviewing, we will discuss treatment plan which may include diagnostic lumbar facet medial branch nerve block versus diagnostic sacroiliac joint injection versus hip injection.  ROS  Constitutional: Denies any fever or chills Gastrointestinal: No reported hemesis, hematochezia, vomiting, or acute GI distress Musculoskeletal:  bilateral hip pain Neurological: No reported episodes of acute onset apraxia, aphasia, dysarthria, agnosia, amnesia, paralysis, loss of coordination, or loss of consciousness  Medication Review  Semaglutide (1 MG/DOSE), aspirin EC, clopidogrel, escitalopram, ezetimibe, furosemide, gabapentin, gemfibrozil, insulin regular human CONCENTRATED, methocarbamol, metoprolol succinate, nitroGLYCERIN, omega-3 acid ethyl esters, rosuvastatin, sacubitril-valsartan, sildenafil, spironolactone, and traZODone  History Review  Allergy: Mr. Ager is allergic to naproxen and morphine and related. Drug: Mr. Seufert  reports no history of drug use. Alcohol:  reports no history of alcohol use. Tobacco:  reports that he quit smoking about 3 years ago. His smoking use included cigarettes. He has a 25.00 pack-year smoking history. He has never used smokeless tobacco. Social: Mr. Mangual  reports that he quit smoking about 3 years ago. His smoking use included cigarettes. He has a 25.00 pack-year smoking history. He has never used smokeless tobacco. He reports that he does not drink alcohol and does not use drugs. Medical:  has a past medical history of AICD (automatic cardioverter/defibrillator) present, Allergy, Anxiety, Appendicitis, Arthritis, Back pain,  Cardiomyopathy ([place]),  CHF (congestive heart failure) ([place]), Coronary artery disease, Depression, Diabetes mellitus without complication ([place]), GERD (gastroesophageal reflux disease), Headache, History of bronchitis, History of colon polyps, History of hiatal hernia, Hyperlipidemia, Hypertension, MI (myocardial infarction) ([place]) (2001), Obesity, Peripheral neuropathy, Pneumonia, PONV (postoperative nausea and vomiting), Presence of permanent cardiac pacemaker, Shortness of breath dyspnea, Sleep apnea, and Ventricular tachycardia ([place]). Surgical: Mr. Garrido  has a past surgical history that includes Cardiac electrophysiology study and ablation; Cardiac catheterization; Knee surgery; Back surgery; Cardiac defibrillator placement (10/16/2007); Insert / replace / remove pacemaker; Cardiac catheterization (N/A, 06/16/2015); Cholecystectomy (2001); Cardiac catheterization (Left, 05/10/2016); Coronary angioplasty with stent; Colonoscopy; Esophagogastroduodenoscopy; Vasectomy; Coronary artery bypass graft (N/A, 05/24/2016); [name] without cardioversion (N/A, 05/24/2016); and Colonoscopy with propofol (N/A, 08/28/2018). Family: family history includes Heart attack (age of onset: 49) in his mother; Heart attack (age of onset: 21) in his father; Heart disease in his maternal grandmother and maternal uncle; Hypertension in his father, maternal uncle, and mother; Melanoma in his mother; Stroke in his maternal grandmother.  Laboratory Chemistry Profile   Renal Lab Results  Component Value Date   BUN 19 04/06/2021   CREATININE 0.89 04/06/2021   BCR 23 (H) 04/05/2020   GFR 81.43 07/22/2020   GFRAA 111 04/05/2020   GFRNONAA >60 04/06/2021    Hepatic Lab Results  Component Value Date   AST 16 04/04/2021   ALT 18 04/04/2021   ALBUMIN 3.7 04/04/2021   ALKPHOS 30 (L) 04/04/2021   HCVAB NEGATIVE 10/06/2015   LIPASE 25 04/03/2021    Electrolytes Lab Results  Component Value Date   NA 139 04/06/2021   K 3.5 04/06/2021    CL 102 04/06/2021   CALCIUM 9.2 04/06/2021   MG 2.0 04/06/2021   PHOS 5.7 (H) 12/07/2020    Bone No results found for: VD25OH, AX655VZ4MOL, MB8675QG9, EE1007HQ1, 25OHVITD1, 25OHVITD2, 25OHVITD3, TESTOFREE, TESTOSTERONE  Inflammation (CRP: Acute Phase) (ESR: Chronic Phase) Lab Results  Component Value Date   CRP 1.1 (H) 12/07/2020   LATICACIDVEN 1.0 12/06/2020         Note: Above Lab results reviewed.  Recent Imaging Review  DG Lumbar Spine Complete CLINICAL DATA:  Back pain.  EXAM: LUMBAR SPINE - COMPLETE 4+ VIEW  COMPARISON:  CT 04/03/2021.  Lumbar spine series 01/14/2020.  FINDINGS: Diffuse multilevel degenerative change lumbar spine again noted. No acute abnormality identified. No evidence of fracture. Aortoiliac atherosclerotic vascular calcification. Surgical clips right abdomen.  IMPRESSION: 1. Diffuse multilevel degenerative change again noted. No acute bony abnormality identified.  2.  Aortoiliac atherosclerotic vascular disease.  Electronically Signed   By: [name]  Register M.D.   On: 05/10/2021 09:11 Note: Reviewed        Physical Exam  General appearance: Well nourished, well developed, and well hydrated. In no apparent acute distress Mental status: Alert, oriented x 3 (person, place, & time)       Respiratory: No evidence of acute respiratory distress Eyes: PERLA Vitals: BP 105/77   Pulse 71   Temp (!) 97.2 F (36.2 C) (Temporal)   Resp 18   Ht 6' (1.829 m)   Wt 265 lb (120.2 kg)   SpO2 100%   BMI 35.94 kg/m  BMI: Estimated body mass index is 35.94 kg/m as calculated from the following:   Height as of this encounter: 6' (1.829 m).   Weight as of this encounter: 265 lb (120.2 kg). Ideal: Ideal body weight: 77.6 kg (171 lb 1.2 oz) Adjusted ideal body weight: 94.6 kg (208 lb 10.3 oz)  Assessment   Status Diagnosis  Controlled Having a Flare-up Controlled 1. Lumbar post-laminectomy syndrome   2. Bilateral hip pain   3. Lumbar facet  arthropathy   4. Chronic SI joint pain   5. Lumbar spondylosis   6. Lumbar degenerative disc disease   7. Diabetic polyneuropathy associated with type 2 diabetes mellitus ([place])   8. Chronic pain syndrome       Plan of Care    Mr. WANE MOLLETT has a current medication list which includes the following long-term medication(s): escitalopram, ezetimibe, gemfibrozil, metoprolol succinate, nitroglycerin, omega-3 acid ethyl esters, rosuvastatin, and trazodone.  Pharmacotherapy (Medications Ordered): Meds ordered this encounter  Medications   methocarbamol (ROBAXIN) 750 MG tablet    Sig: Take 1 tablet (750 mg total) by mouth every 8 (eight) hours as needed for muscle spasms.    Dispense:  90 tablet    Refill:  2    Do not place this medication, or any other prescription from our practice, on "Automatic Refill". Patient may have prescription filled one day early if pharmacy is closed on scheduled refill date.   Orders:  Orders Placed This Encounter  Procedures   DG HIP UNILAT W OR W/O PELVIS 2-3 VIEWS LEFT    Please describe any evidence of DJD, such as joint narrowing, asymmetry, cysts, or any anomalies in bone density, production, or erosion.    Standing Status:   Future    Standing Expiration Date:   06/24/2021    Scheduling Instructions:     Imaging must be done as soon as possible. Inform patient that order will expire within 30 days and I will not renew it.    Order Specific Question:   Reason for Exam (SYMPTOM  OR DIAGNOSIS REQUIRED)    Answer:   Left hip pain/arthralgia    Order Specific Question:   Preferred imaging location?    Answer:   [facility]    Order Specific Question:   Call Results- Best Contact Number?    Answer:   [phone] ([facility])   DG HIP UNILAT W OR W/O PELVIS 2-3 VIEWS RIGHT    Please describe any evidence of DJD, such as joint narrowing, asymmetry, cysts, or any anomalies in bone density, production, or erosion.    Standing Status:    Future    Standing Expiration Date:   06/24/2021    Scheduling Instructions:     Imaging must be done as soon as possible. Inform patient that order will expire within 30 days and I will not renew it.    Order Specific Question:   Reason for Exam (SYMPTOM  OR DIAGNOSIS REQUIRED)    Answer:   Left hip pain/arthralgia    Order Specific Question:   Preferred imaging location?    Answer:   [facility]    Order Specific Question:   Call Results- Best Contact Number?    Answer:   [phone]) [phone] ([facility])    Order Specific Question:   Release to patient    Answer:   Immediate   DG Si Joints    Standing Status:   Future    Standing Expiration Date:   05/25/2022    Order Specific Question:   Reason for Exam (SYMPTOM  OR DIAGNOSIS REQUIRED)    Answer:   si joint pain    Order Specific Question:   Preferred imaging location?    Answer:   [facility]   Continue gabapentin as prescribed.  Follow-up plan:  Return for will call patient with xray results & treatment plan.       Recent Visits Date Type Provider Dept  05/11/21 Office Visit Gillis Santa, MD [facility]  Showing recent visits within past 90 days and meeting all other requirements Today's Visits Date Type Provider Dept  05/25/21 Office Visit Gillis Santa, MD [facility]  Showing today's visits and meeting all other requirements Future Appointments Date Type Provider Dept  06/22/21 Appointment Gillis Santa, MD [facility]  Showing future appointments within next 90 days and meeting all other requirements I discussed the assessment and treatment plan with the patient. The patient was provided an opportunity to ask questions and all were answered. The patient agreed with the plan and demonstrated an understanding of the instructions.  Patient advised to call back or seek an in-person evaluation if the symptoms or condition worsens.  Duration of encounter: 85mnutes.  Note by:  BGillis Santa MD Date: 05/25/2021; Time: 11:10 AM

## 2021-05-25 NOTE — Progress Notes (Signed)
Safety precautions to be maintained throughout the outpatient stay will include: orient to surroundings, keep bed in low position, maintain call bell within reach at all times, provide assistance with transfer out of bed and ambulation.  

## 2021-05-28 ENCOUNTER — Other Ambulatory Visit: Payer: Self-pay | Admitting: Primary Care

## 2021-05-28 DIAGNOSIS — M545 Low back pain, unspecified: Secondary | ICD-10-CM

## 2021-05-28 DIAGNOSIS — G8929 Other chronic pain: Secondary | ICD-10-CM

## 2021-05-31 ENCOUNTER — Ambulatory Visit: Payer: Medicare Other | Admitting: Physical Therapy

## 2021-05-31 DIAGNOSIS — K36 Other appendicitis: Secondary | ICD-10-CM | POA: Diagnosis not present

## 2021-05-31 DIAGNOSIS — R1031 Right lower quadrant pain: Secondary | ICD-10-CM | POA: Diagnosis not present

## 2021-05-31 DIAGNOSIS — I255 Ischemic cardiomyopathy: Secondary | ICD-10-CM | POA: Diagnosis not present

## 2021-05-31 DIAGNOSIS — K3532 Acute appendicitis with perforation and localized peritonitis, without abscess: Secondary | ICD-10-CM | POA: Diagnosis not present

## 2021-05-31 DIAGNOSIS — K3580 Unspecified acute appendicitis: Secondary | ICD-10-CM | POA: Diagnosis not present

## 2021-05-31 DIAGNOSIS — G4733 Obstructive sleep apnea (adult) (pediatric): Secondary | ICD-10-CM | POA: Diagnosis not present

## 2021-05-31 DIAGNOSIS — R0600 Dyspnea, unspecified: Secondary | ICD-10-CM | POA: Diagnosis not present

## 2021-05-31 DIAGNOSIS — Z955 Presence of coronary angioplasty implant and graft: Secondary | ICD-10-CM | POA: Diagnosis not present

## 2021-05-31 DIAGNOSIS — K37 Unspecified appendicitis: Secondary | ICD-10-CM | POA: Diagnosis not present

## 2021-05-31 DIAGNOSIS — Z7982 Long term (current) use of aspirin: Secondary | ICD-10-CM | POA: Diagnosis not present

## 2021-05-31 DIAGNOSIS — I5022 Chronic systolic (congestive) heart failure: Secondary | ICD-10-CM | POA: Diagnosis not present

## 2021-05-31 DIAGNOSIS — E669 Obesity, unspecified: Secondary | ICD-10-CM | POA: Diagnosis not present

## 2021-05-31 DIAGNOSIS — E1142 Type 2 diabetes mellitus with diabetic polyneuropathy: Secondary | ICD-10-CM | POA: Diagnosis not present

## 2021-05-31 DIAGNOSIS — Z7902 Long term (current) use of antithrombotics/antiplatelets: Secondary | ICD-10-CM | POA: Diagnosis not present

## 2021-05-31 DIAGNOSIS — Z794 Long term (current) use of insulin: Secondary | ICD-10-CM | POA: Diagnosis not present

## 2021-05-31 DIAGNOSIS — I2510 Atherosclerotic heart disease of native coronary artery without angina pectoris: Secondary | ICD-10-CM | POA: Diagnosis not present

## 2021-05-31 DIAGNOSIS — R0989 Other specified symptoms and signs involving the circulatory and respiratory systems: Secondary | ICD-10-CM | POA: Diagnosis not present

## 2021-05-31 DIAGNOSIS — I110 Hypertensive heart disease with heart failure: Secondary | ICD-10-CM | POA: Diagnosis not present

## 2021-05-31 DIAGNOSIS — E785 Hyperlipidemia, unspecified: Secondary | ICD-10-CM | POA: Diagnosis not present

## 2021-05-31 DIAGNOSIS — Z87891 Personal history of nicotine dependence: Secondary | ICD-10-CM | POA: Diagnosis not present

## 2021-05-31 DIAGNOSIS — Z20822 Contact with and (suspected) exposure to covid-19: Secondary | ICD-10-CM | POA: Diagnosis not present

## 2021-06-14 ENCOUNTER — Encounter: Payer: Medicare Other | Admitting: Physical Therapy

## 2021-06-16 DIAGNOSIS — I10 Essential (primary) hypertension: Secondary | ICD-10-CM | POA: Diagnosis not present

## 2021-06-16 DIAGNOSIS — G4733 Obstructive sleep apnea (adult) (pediatric): Secondary | ICD-10-CM | POA: Diagnosis not present

## 2021-06-16 DIAGNOSIS — R0681 Apnea, not elsewhere classified: Secondary | ICD-10-CM | POA: Diagnosis not present

## 2021-06-19 DIAGNOSIS — I739 Peripheral vascular disease, unspecified: Secondary | ICD-10-CM | POA: Diagnosis not present

## 2021-06-19 DIAGNOSIS — M1611 Unilateral primary osteoarthritis, right hip: Secondary | ICD-10-CM | POA: Diagnosis not present

## 2021-06-19 DIAGNOSIS — M47816 Spondylosis without myelopathy or radiculopathy, lumbar region: Secondary | ICD-10-CM | POA: Diagnosis not present

## 2021-06-19 DIAGNOSIS — M1612 Unilateral primary osteoarthritis, left hip: Secondary | ICD-10-CM | POA: Diagnosis not present

## 2021-06-20 ENCOUNTER — Encounter: Payer: Medicare Other | Admitting: Physical Therapy

## 2021-06-20 DIAGNOSIS — Z09 Encounter for follow-up examination after completed treatment for conditions other than malignant neoplasm: Secondary | ICD-10-CM | POA: Diagnosis not present

## 2021-06-20 DIAGNOSIS — K37 Unspecified appendicitis: Secondary | ICD-10-CM | POA: Diagnosis not present

## 2021-06-20 DIAGNOSIS — K3532 Acute appendicitis with perforation and localized peritonitis, without abscess: Secondary | ICD-10-CM | POA: Diagnosis not present

## 2021-06-22 ENCOUNTER — Other Ambulatory Visit: Payer: Self-pay

## 2021-06-22 ENCOUNTER — Encounter: Payer: Self-pay | Admitting: Student in an Organized Health Care Education/Training Program

## 2021-06-22 ENCOUNTER — Ambulatory Visit
Payer: Medicare Other | Attending: Student in an Organized Health Care Education/Training Program | Admitting: Student in an Organized Health Care Education/Training Program

## 2021-06-22 VITALS — BP 113/59 | HR 68 | Temp 97.2°F | Resp 15 | Ht 72.0 in | Wt 265.0 lb

## 2021-06-22 DIAGNOSIS — M961 Postlaminectomy syndrome, not elsewhere classified: Secondary | ICD-10-CM | POA: Diagnosis not present

## 2021-06-22 DIAGNOSIS — M533 Sacrococcygeal disorders, not elsewhere classified: Secondary | ICD-10-CM | POA: Insufficient documentation

## 2021-06-22 DIAGNOSIS — M25551 Pain in right hip: Secondary | ICD-10-CM | POA: Insufficient documentation

## 2021-06-22 DIAGNOSIS — G8929 Other chronic pain: Secondary | ICD-10-CM | POA: Diagnosis not present

## 2021-06-22 DIAGNOSIS — M47816 Spondylosis without myelopathy or radiculopathy, lumbar region: Secondary | ICD-10-CM | POA: Diagnosis not present

## 2021-06-22 DIAGNOSIS — M25552 Pain in left hip: Secondary | ICD-10-CM | POA: Diagnosis not present

## 2021-06-22 MED ORDER — METHOCARBAMOL 750 MG PO TABS: 750.0000 mg | ORAL_TABLET | Freq: Three times a day (TID) | ORAL | 5 refills | Status: DC | PRN

## 2021-06-22 NOTE — Progress Notes (Signed)
Safety precautions to be maintained throughout the outpatient stay will include: orient to surroundings, keep bed in low position, maintain call bell within reach at all times, provide assistance with transfer out of bed and ambulation.  

## 2021-06-22 NOTE — Progress Notes (Signed)
PROVIDER NOTE: Information contained herein reflects review and annotations entered in association with encounter. Interpretation of such information and data should be left to medically-trained personnel. Information provided to patient can be located elsewhere in the medical record under "Patient Instructions". Document created using STT-dictation technology, any transcriptional errors that may result from process are unintentional.    Patient: Brian Williams  Service Category: E/M  Provider: Gillis Santa, MD  DOB: 1961-06-14  DOS: 06/22/2021  Specialty: Interventional Pain Management  MRN: [id number]  Setting: Ambulatory outpatient  PCP: Pleas Koch, NP  Type: Established Patient    Referring Provider: Pleas Koch, NP  Location: Office  Delivery: Face-to-face     HPI  [name], a 60 y.o. year old male, is here today because of his Lumbar post-laminectomy syndrome [M96.1]. [name] primary complain today is Knee Pain (bilat) Last encounter: My last encounter with him was on 05/25/21 Pertinent problems: [name] has Low back pain; Lumbar degenerative disc disease; Lumbar facet arthropathy; Lumbar post-laminectomy syndrome; Chronic pain syndrome; and Chronic SI joint pain on their pertinent problem list. Pain Assessment: Severity of Chronic pain is reported as a 4 /10. Location: Knee Right, Left/denies. Onset: More than a month ago. Quality: Aching, Sharp. Timing: Constant. Modifying factor(s): meds. Vitals:  height is 6' (1.829 m) and weight is 265 lb (120.2 kg). His temporal temperature is 97.2 F (36.2 C) (abnormal). His blood pressure is 113/59 (abnormal) and his pulse is 68. His respiration is 15 and oxygen saturation is 100%.   Reason for encounter:   Patient presents today for follow-up.  Since his last visit with me, he underwent a appendectomy for appendicitis which he states has helped improve his low back pain.  He is not been cleared yet to return to aquatic  therapy which she is hoping to do in the next couple of months.  He is wondering if he can start physical therapy which I encouraged him to do.  He is utilizing Robaxin less often since he states that his back pain is reduced.  I will send in a refill, follow-up as needed.  ROS  Constitutional: Denies any fever or chills Gastrointestinal: No reported hemesis, hematochezia, vomiting, or acute GI distress Musculoskeletal:  bilateral hip pain, stiffness Neurological: No reported episodes of acute onset apraxia, aphasia, dysarthria, agnosia, amnesia, paralysis, loss of coordination, or loss of consciousness  Medication Review  Semaglutide (1 MG/DOSE), aspirin EC, clopidogrel, escitalopram, ezetimibe, furosemide, gabapentin, gemfibrozil, insulin regular human CONCENTRATED, methocarbamol, metoprolol succinate, nitroGLYCERIN, omega-3 acid ethyl esters, rosuvastatin, sacubitril-valsartan, sildenafil, spironolactone, and traZODone  History Review  Allergy: [name] is allergic to naproxen and morphine and related. Drug: [name]  reports no history of drug use. Alcohol:  reports no history of alcohol use. Tobacco:  reports that he quit smoking about 3 years ago. His smoking use included cigarettes. He has a 25.00 pack-year smoking history. He has never used smokeless tobacco. Social: [name]  reports that he quit smoking about 3 years ago. His smoking use included cigarettes. He has a 25.00 pack-year smoking history. He has never used smokeless tobacco. He reports that he does not drink alcohol and does not use drugs. Medical:  has a past medical history of AICD (automatic cardioverter/defibrillator) present, Allergy, Anxiety, Appendicitis, Arthritis, Back pain, Cardiomyopathy ([place]), CHF (congestive heart failure) ([place]), Coronary artery disease, Depression, Diabetes mellitus without complication ([place]), GERD (gastroesophageal reflux disease), Headache, History of bronchitis, History of colon polyps,  History of hiatal  hernia, Hyperlipidemia, Hypertension, MI (myocardial infarction) ([place]) (2001), Obesity, Peripheral neuropathy, Pneumonia, PONV (postoperative nausea and vomiting), Presence of permanent cardiac pacemaker, Shortness of breath dyspnea, Sleep apnea, and Ventricular tachycardia. Surgical: [name]  has a past surgical history that includes Cardiac electrophysiology study and ablation; Cardiac catheterization; Knee surgery; Back surgery; Cardiac defibrillator placement (10/16/2007); Insert / replace / remove pacemaker; Cardiac catheterization (N/A, 06/16/2015); Cholecystectomy (2001); Cardiac catheterization (Left, 05/10/2016); Coronary angioplasty with stent; Colonoscopy; Esophagogastroduodenoscopy; Vasectomy; Coronary artery bypass graft (N/A, 05/24/2016); [name] without cardioversion (N/A, 05/24/2016); Colonoscopy with propofol (N/A, 08/28/2018); and Appendectomy. Family: family history includes Heart attack (age of onset: 67) in his mother; Heart attack (age of onset: 39) in his father; Heart disease in his maternal grandmother and maternal uncle; Hypertension in his father, maternal uncle, and mother; Melanoma in his mother; Stroke in his maternal grandmother.  Laboratory Chemistry Profile   Renal Lab Results  Component Value Date   BUN 19 04/06/2021   CREATININE 0.89 04/06/2021   BCR 23 (H) 04/05/2020   GFR 81.43 07/22/2020   GFRAA 111 04/05/2020   GFRNONAA >60 04/06/2021    Hepatic Lab Results  Component Value Date   AST 16 04/04/2021   ALT 18 04/04/2021   ALBUMIN 3.7 04/04/2021   ALKPHOS 30 (L) 04/04/2021   HCVAB NEGATIVE 10/06/2015   LIPASE 25 04/03/2021    Electrolytes Lab Results  Component Value Date   NA 139 04/06/2021   K 3.5 04/06/2021   CL 102 04/06/2021   CALCIUM 9.2 04/06/2021   MG 2.0 04/06/2021   PHOS 5.7 (H) 12/07/2020    Bone No results found for: VD25OH, [id number], GM0102VO5, [id number], 25OHVITD1, 25OHVITD2, 25OHVITD3, TESTOFREE, TESTOSTERONE   Inflammation (CRP: Acute Phase) (ESR: Chronic Phase) Lab Results  Component Value Date   CRP 1.1 (H) 12/07/2020   LATICACIDVEN 1.0 12/06/2020         Note: Above Lab results reviewed.  Recent Imaging Review  DG HIP UNILAT W OR W/O PELVIS 2-3 VIEWS RIGHT CLINICAL DATA:  Chronic right and left hip pain. Chronic sacroiliac joint pain. Arthralgia. Chronic pain syndrome.  EXAM: DG HIP (WITH OR WITHOUT PELVIS) 2-3V LEFT; DG HIP (WITH OR WITHOUT PELVIS) 2-3V RIGHT; BILATERAL SACROILIAC JOINTS - 3+ VIEW  COMPARISON:  None.  FINDINGS: Left hip: Hip joint space is preserved. There is lateral acetabular spurring. The femoral head is well seated. Osseous bump at the femoral head neck junction. No evidence of avascular necrosis.  Right hip: Hip joint space is preserved. There is lateral acetabular spurring. Fragmented osteophyte versus labral calcification. Osseous bump at the femoral head neck junction. No evidence of avascular necrosis.  Pelvis: Mild enthesopathic changes about the iliac crests. No evidence of fracture, focal bone lesion or bony destruction. Intact pubic rami.  Sacroiliac joints: The sacroiliac joints are congruent. No abnormal widening or narrowing. No erosive change. There is trace inferior degenerative spurring.  IMPRESSION: 1. Mild osteoarthritis of both hips. Osseous bumps at the femoral head neck junction can be seen with cam-type femoroacetabular impingement. 2. Minimal degenerative change of the sacroiliac joints without evidence of erosive arthropathy.  Electronically Signed   By: Keith Rake M.D.   On: 05/28/2021 11:19 DG Si Joints CLINICAL DATA:  Chronic right and left hip pain. Chronic sacroiliac joint pain. Arthralgia. Chronic pain syndrome.  EXAM: DG HIP (WITH OR WITHOUT PELVIS) 2-3V LEFT; DG HIP (WITH OR WITHOUT PELVIS) 2-3V RIGHT; BILATERAL SACROILIAC JOINTS - 3+ VIEW  COMPARISON:  None.  FINDINGS: Left hip: Hip  joint space is  preserved. There is lateral acetabular spurring. The femoral head is well seated. Osseous bump at the femoral head neck junction. No evidence of avascular necrosis.  Right hip: Hip joint space is preserved. There is lateral acetabular spurring. Fragmented osteophyte versus labral calcification. Osseous bump at the femoral head neck junction. No evidence of avascular necrosis.  Pelvis: Mild enthesopathic changes about the iliac crests. No evidence of fracture, focal bone lesion or bony destruction. Intact pubic rami.  Sacroiliac joints: The sacroiliac joints are congruent. No abnormal widening or narrowing. No erosive change. There is trace inferior degenerative spurring.  IMPRESSION: 1. Mild osteoarthritis of both hips. Osseous bumps at the femoral head neck junction can be seen with cam-type femoroacetabular impingement. 2. Minimal degenerative change of the sacroiliac joints without evidence of erosive arthropathy.  Electronically Signed   By: Keith Rake M.D.   On: 05/28/2021 11:19 DG HIP UNILAT W OR W/O PELVIS 2-3 VIEWS LEFT CLINICAL DATA:  Chronic right and left hip pain. Chronic sacroiliac joint pain. Arthralgia. Chronic pain syndrome.  EXAM: DG HIP (WITH OR WITHOUT PELVIS) 2-3V LEFT; DG HIP (WITH OR WITHOUT PELVIS) 2-3V RIGHT; BILATERAL SACROILIAC JOINTS - 3+ VIEW  COMPARISON:  None.  FINDINGS: Left hip: Hip joint space is preserved. There is lateral acetabular spurring. The femoral head is well seated. Osseous bump at the femoral head neck junction. No evidence of avascular necrosis.  Right hip: Hip joint space is preserved. There is lateral acetabular spurring. Fragmented osteophyte versus labral calcification. Osseous bump at the femoral head neck junction. No evidence of avascular necrosis.  Pelvis: Mild enthesopathic changes about the iliac crests. No evidence of fracture, focal bone lesion or bony destruction. Intact pubic rami.  Sacroiliac joints:  The sacroiliac joints are congruent. No abnormal widening or narrowing. No erosive change. There is trace inferior degenerative spurring.  IMPRESSION: 1. Mild osteoarthritis of both hips. Osseous bumps at the femoral head neck junction can be seen with cam-type femoroacetabular impingement. 2. Minimal degenerative change of the sacroiliac joints without evidence of erosive arthropathy.  Electronically Signed   By: Keith Rake M.D.   On: 05/28/2021 11:19 Note: Reviewed        Physical Exam  General appearance: Well nourished, well developed, and well hydrated. In no apparent acute distress Mental status: Alert, oriented x 3 (person, place, & time)       Respiratory: No evidence of acute respiratory distress Eyes: PERLA Vitals: BP (!) 113/59   Pulse 68   Temp (!) 97.2 F (36.2 C) (Temporal)   Resp 15   Ht 6' (1.829 m)   Wt 265 lb (120.2 kg)   SpO2 100%   BMI 35.94 kg/m  BMI: Estimated body mass index is 35.94 kg/m as calculated from the following:   Height as of this encounter: 6' (1.829 m).   Weight as of this encounter: 265 lb (120.2 kg). Ideal: Ideal body weight: 77.6 kg (171 lb 1.2 oz) Adjusted ideal body weight: 94.6 kg (208 lb 10.3 oz)  Assessment   Status Diagnosis  Controlled Controlled Controlled 1. Lumbar post-laminectomy syndrome   2. Bilateral hip pain   3. Lumbar facet arthropathy   4. Chronic SI joint pain   5. Lumbar spondylosis        Plan of Care    [name] has a current medication list which includes the following long-term medication(s): escitalopram, ezetimibe, gabapentin, gemfibrozil, metoprolol succinate, nitroglycerin, omega-3 acid ethyl esters, rosuvastatin, and trazodone.  Pharmacotherapy (Medications Ordered): Meds ordered this encounter  Medications   methocarbamol (ROBAXIN) 750 MG tablet    Sig: Take 1 tablet (750 mg total) by mouth every 8 (eight) hours as needed for muscle spasms.    Dispense:  90 tablet     Refill:  5    Do not place this medication, or any other prescription from our practice, on "Automatic Refill". Patient may have prescription filled one day early if pharmacy is closed on scheduled refill date.   Start physical therapy as ordered Continue gabapentin as prescribed  Follow-up plan:   Return if symptoms worsen or fail to improve.       Recent Visits Date Type Provider Dept  05/25/21 Office Visit Gillis Santa, MD [facility]  05/11/21 Office Visit Gillis Santa, MD [facility]  Showing recent visits within past 90 days and meeting all other requirements Today's Visits Date Type Provider Dept  06/22/21 Office Visit Gillis Santa, MD [facility]  Showing today's visits and meeting all other requirements Future Appointments No visits were found meeting these conditions. Showing future appointments within next 90 days and meeting all other requirements I discussed the assessment and treatment plan with the patient. The patient was provided an opportunity to ask questions and all were answered. The patient agreed with the plan and demonstrated an understanding of the instructions.  Patient advised to call back or seek an in-person evaluation if the symptoms or condition worsens.  Duration of encounter: 76mnutes.  Note by: BGillis Santa MD Date: 06/22/2021; Time: 10:13 AM

## 2021-06-26 ENCOUNTER — Encounter: Payer: Self-pay | Admitting: General Surgery

## 2021-06-26 ENCOUNTER — Encounter: Payer: Medicare Other | Admitting: Physical Therapy

## 2021-06-28 ENCOUNTER — Encounter: Payer: Medicare Other | Admitting: Physical Therapy

## 2021-06-29 ENCOUNTER — Telehealth: Payer: Self-pay

## 2021-06-29 NOTE — Progress Notes (Addendum)
Chronic Care Management Pharmacy Assistant   Name: Brian Williams  MRN: [id number] DOB: Jun 01, 1961  Reason for Encounter: General Adherence   Recent office visits:  None since last CCM contact  Recent consult visits:  06/22/2021 - Pain Management - Patient presented for follow up for knee pain. Change: methocarbamol (ROBAXIN) 750 MG tablet. 06/20/2021 - [facility] - Patient presented for follow up for appendicitis.  06/19/2021 - Ortho - Patient presented for back pain and hip pain.  05/25/2021 - Pain Management - Patient presented for back pain and hip pain. Orders: DG Hip Unilate w or w/o pelvis left and right. DG Si joints. Change: methocarbamol (ROBAXIN) 750 MG tablet. Stop: pregabalin (LYRICA) 75 MG capsule. 05/23/2021 - Cardiology - Patient presented for Ischemic Cardiomyopathy. Labs: EKG  Hospital visits:  Medication Reconciliation was completed by comparing discharge summary, patient's EMR and Pharmacy list, and upon discussion with patient.  Admitted to the hospital on 06/05/2021 due to acute appendicitis. Patient had Laparoscopic appendectomy.  Discharge date was 06/05/2021. Discharged from [facility]?Medications Started at [facility] Discharge:?? -started methocarbamoL 500 MG tablet; Commonly known as: ROBAXIN; Take 1 tablet  (500 mg total) by mouth every eight (8) hours for 7 days.  -started oxyCODONE 5 MG immediate release tablet; Commonly known as: ROXICODONE;  Take 1 tablet (5 mg total) by mouth every four (4) hours as needed for up  to 5 days.  Medication Changes at Hospital Discharge: -Changed OZEMPIC 1 mg/dose (4 mg/3 mL) Pnij injection; Generic drug: semaglutide;  Inject 1 mg under the skin every seven (7) days.; What changed: when to  take this  Medications Discontinued at Hospital Discharge: -Stopped acetaminophen 500 MG tablet; Commonly known as: TYLENOL  Medications that remain the same after Hospital Discharge:??  -All other  medications will remain the same.    Medications: Outpatient Encounter Medications as of 06/29/2021  Medication Sig   aspirin EC 81 MG tablet Take 81 mg by mouth daily.    clopidogrel (PLAVIX) 75 MG tablet TAKE ONE TABLET BY MOUTH DAILY   escitalopram (LEXAPRO) 20 MG tablet TAKE 1 TABLET BY MOUTH  DAILY FOR ANXIETY AND  DEPRESSION   ezetimibe (ZETIA) 10 MG tablet TAKE 1 TABLET BY MOUTH  DAILY   furosemide (LASIX) 40 MG tablet Take 80 mg in the am & 40 mg in the pm.   gabapentin (NEURONTIN) 600 MG tablet TAKE TWO TABLETS BY MOUTH TWICE A DAY FOR PAIN   gemfibrozil (LOPID) 600 MG tablet TAKE 1 TABLET BY MOUTH  DAILY   insulin regular human CONCENTRATED (HUMULIN R) 500 UNIT/ML injection Inject 0-25 Units into the skin daily at 12 noon.   methocarbamol (ROBAXIN) 750 MG tablet Take 1 tablet (750 mg total) by mouth every 8 (eight) hours as needed for muscle spasms.   metoprolol succinate (TOPROL-XL) 50 MG 24 hr tablet Take 50 mg by mouth daily.   nitroGLYCERIN (NITROSTAT) 0.4 MG SL tablet Place 1 tablet (0.4 mg total) under the tongue every 5 (five) minutes as needed for chest pain.   omega-3 acid ethyl esters (LOVAZA) 1 g capsule Take 2 capsules by mouth 2 (two) times daily.   rosuvastatin (CRESTOR) 20 MG tablet TAKE 1 TABLET BY MOUTH AT  BEDTIME FOR CHOLESTEROL   sacubitril-valsartan (ENTRESTO) 97-103 MG Take 0.5 tablets by mouth 2 (two) times daily.   Semaglutide, 1 MG/DOSE, (OZEMPIC, 1 MG/DOSE,) 4 MG/3ML SOPN Inject 1 mg into the skin once a week. (  Patient not taking: Reported on 06/22/2021)   sildenafil (REVATIO) 20 MG tablet Take 2 to 5 tablets by mouth 1 hour prior to intercourse as needed.   spironolactone (ALDACTONE) 25 MG tablet Take 25 mg by mouth daily. 1/2 tab bid   traZODone (DESYREL) 100 MG tablet TAKE 2 TABLETS BY MOUTH  EVERY NIGHT AT BEDTIME   No facility-administered encounter medications on file as of 06/29/2021.   Contacted [name] on 07/03/2021 for general disease  state and medication adherence call.   Patient is not > 5 days past due for refill on the following medications per chart history:  [facility] Medications: Medication Name/mg Last Fill Days Supply Rosuvastatin 20mg   04/20/2021 90 Ozempic 1mg    PAP    Humalin r 500 Unit  PAP   What concerns do you have about your medications? None  The patient denies side effects with his medications.   How often do you forget or accidentally miss a dose? Never  Do you use a pillbox? Yes  Are you having any problems getting your medications from your pharmacy? No  Has the cost of your medications been a concern? No  Since last visit with CPP, no interventions have been made:   The patient has had an ED visit since last contact.   The patient denies problems with their health.   he denies  concerns or questions for [name], Pharm. D at this time.   Counseled patient on:  Great job taking medications  Care Gaps: Annual wellness visit in last year? No appointment for AWV on 07/21/2021 Most Recent BP reading: 113/59 on 06/22/2021  If Diabetic: Most recent A1C reading: 6.5 on 02/24/2021 Last eye exam / retinopathy screening: Up to date Last diabetic foot exam: Feb 04, 2020  PCP appointment on 11/04 for AWV and 11/10 for Physical  [name], CPP notified  Marijean Niemann, [place] [phone]   Time Spent:  30 Minutes

## 2021-06-30 DIAGNOSIS — E113293 Type 2 diabetes mellitus with mild nonproliferative diabetic retinopathy without macular edema, bilateral: Secondary | ICD-10-CM | POA: Diagnosis not present

## 2021-07-03 ENCOUNTER — Encounter: Payer: Medicare Other | Admitting: Physical Therapy

## 2021-07-05 ENCOUNTER — Encounter: Payer: Medicare Other | Admitting: Physical Therapy

## 2021-07-10 ENCOUNTER — Encounter: Payer: Medicare Other | Admitting: Physical Therapy

## 2021-07-12 ENCOUNTER — Encounter: Payer: Medicare Other | Admitting: Physical Therapy

## 2021-07-13 ENCOUNTER — Ambulatory Visit
Admission: EM | Admit: 2021-07-13 | Discharge: 2021-07-13 | Disposition: A | Discharge status: Home / Self Care | Payer: Medicare Other

## 2021-07-13 ENCOUNTER — Ambulatory Visit (INDEPENDENT_AMBULATORY_CARE_PROVIDER_SITE_OTHER): Payer: Medicare Other | Admitting: Primary Care

## 2021-07-13 ENCOUNTER — Encounter: Payer: Self-pay | Admitting: Emergency Medicine

## 2021-07-13 ENCOUNTER — Inpatient Hospital Stay
Admission: RE | Admit: 2021-07-13 | Discharge: 2021-07-13 | Disposition: A | Discharge status: Home / Self Care | Payer: Self-pay | Source: Ambulatory Visit

## 2021-07-13 ENCOUNTER — Other Ambulatory Visit: Payer: Self-pay

## 2021-07-13 VITALS — BP 132/67 | HR 73 | Temp 97.1°F | Ht 72.0 in | Wt 270.0 lb

## 2021-07-13 DIAGNOSIS — B349 Viral infection, unspecified: Secondary | ICD-10-CM | POA: Diagnosis not present

## 2021-07-13 DIAGNOSIS — R6883 Chills (without fever): Secondary | ICD-10-CM

## 2021-07-13 DIAGNOSIS — R5383 Other fatigue: Secondary | ICD-10-CM

## 2021-07-13 DIAGNOSIS — R52 Pain, unspecified: Secondary | ICD-10-CM

## 2021-07-13 LAB — COMPREHENSIVE METABOLIC PANEL
ALT: 28 U/L (ref 0–53)
AST: 26 U/L (ref 0–37)
Albumin: 4.3 g/dL (ref 3.5–5.2)
Alkaline Phosphatase: 44 U/L (ref 39–117)
BUN: 8 mg/dL (ref 6–23)
CO2: 24 mEq/L (ref 19–32)
Calcium: 9.6 mg/dL (ref 8.4–10.5)
Chloride: 105 mEq/L (ref 96–112)
Creatinine, Ser: 0.77 mg/dL (ref 0.40–1.50)
GFR: 97.36 mL/min (ref >60.00)
Glucose, Bld: 102 mg/dL — ABNORMAL HIGH (ref 70–99)
Potassium: 4.1 mEq/L (ref 3.5–5.1)
Sodium: 137 mEq/L (ref 135–145)
Total Bilirubin: 0.3 mg/dL (ref 0.2–1.2)
Total Protein: 7.3 g/dL (ref 6.0–8.3)

## 2021-07-13 LAB — CBC WITH DIFFERENTIAL/PLATELET
Basophils Absolute: 0 10*3/uL (ref 0.0–0.1)
Basophils Relative: 0.6 % (ref 0.0–3.0)
Eosinophils Absolute: 0.2 10*3/uL (ref 0.0–0.7)
Eosinophils Relative: 4.2 % (ref 0.0–5.0)
HCT: 32.2 % — ABNORMAL LOW (ref 39.0–52.0)
Hemoglobin: 11 g/dL — ABNORMAL LOW (ref 13.0–17.0)
Lymphocytes Relative: 45.9 % (ref 12.0–46.0)
Lymphs Abs: 2.7 10*3/uL (ref 0.7–4.0)
MCHC: 34.2 g/dL (ref 30.0–36.0)
MCV: 93.2 fl (ref 78.0–100.0)
Monocytes Absolute: 0.4 10*3/uL (ref 0.1–1.0)
Monocytes Relative: 6.5 % (ref 3.0–12.0)
Neutro Abs: 2.5 10*3/uL (ref 1.4–7.7)
Neutrophils Relative %: 42.8 % — ABNORMAL LOW (ref 43.0–77.0)
Platelets: 161 10*3/uL (ref 150.0–400.0)
RBC: 3.46 Mil/uL — ABNORMAL LOW (ref 4.22–5.81)
RDW: 13.5 % (ref 11.5–15.5)
WBC: 5.9 10*3/uL (ref 4.0–10.5)

## 2021-07-13 LAB — POCT INFLUENZA A/B
Influenza A, POC: NEGATIVE
Influenza B, POC: NEGATIVE

## 2021-07-13 NOTE — Progress Notes (Signed)
Subjective:    Patient ID: Brian Williams, male    DOB: 10-May-1961, 60 y.o.   MRN: [id number]  HPI  Brian Williams is a very pleasant 60 y.o. male with a significant medical history including hypertension, acute appendicitis, congestive dilated cardiomyopathy, OSA, uncontrolled type 2 diabetes, chronic pain syndrome, tobacco abuse who presents today to discuss fevers and chills.  Evaluated at Urgent Care this morning for a six day history of body aches, chills, fatigue. He declined influenza and Covid testing and was instructed to see his PCP.   Today he endorses a five day history of chills, fatigue, body aches, decrease in appetite, nausea with eating. He had three episodes of vomiting five days ago. Six days ago he ate a pizza for lunch, started feeling "poorly that evening".   Chills have been persistent, will also wake up in sweats. He denies fevers, cough, diarrhea, loss of taste/smell. He's been taking 2000 mg of Tylenol every four hours for the last 4.5 days with temporary improvement. He denies fevers, cough, post nasal drip, sore throat, headaches, diarrhea.   He's not taken most of his prescribed medications for the last one week, except for his pain medications. His wife has no symptoms. He's been eating mostly [employer] donuts and chicken broth and drinking Coke.   BP Readings from Last 3 Encounters:  07/13/21 132/67  07/13/21 134/67  06/22/21 (!) 113/59      Review of Systems  Constitutional:  Positive for chills and fatigue. Negative for fever.  Respiratory:  Negative for cough and shortness of breath.   Cardiovascular:  Negative for chest pain.  Gastrointestinal:  Negative for diarrhea.  Musculoskeletal:  Positive for myalgias.  Neurological:  Negative for headaches.        Past Medical History:  Diagnosis Date   AICD (automatic cardioverter/defibrillator) present    Allergy    Anxiety    takes Xanax as needed   Appendicitis    Arthritis    back,knees,right  shoulder   Back pain    Cardiomyopathy ([place])    CHF (congestive heart failure) (HCC)    takes Lasix and Aldactone daily   Coronary artery disease    takes Plavix daily   Depression    takes Zoloft daily   Diabetes mellitus without complication (HCC)    Humulin R and Farxiga daily.Fasting blood sugar runs 140   GERD (gastroesophageal reflux disease)    Headache    History of bronchitis    History of colon polyps    benign   History of hiatal hernia    Hyperlipidemia    takes Fenofibrate,Crestor, and Zetia daily   Hypertension    takes Entresto and Coreg daily   MI (myocardial infarction) ([place]) 2001   Obesity    Peripheral neuropathy    Pneumonia    history of-last time about 14 yrs ago   PONV (postoperative nausea and vomiting)    after knee surgery 25 yrs ago b/p stayed elevated for a while   Presence of permanent cardiac pacemaker    Shortness of breath dyspnea    with exertion   Sleep apnea    uses CPAP nightly   Ventricular tachycardia    s/p RFCA PVCs 2013    Social History   Socioeconomic History   Marital status: Married    Spouse name: Clever Geraldo   Number of children: 2   Years of education: Not on file   Highest education level: Not on file  Occupational History   Not on file  Tobacco Use   Smoking status: Former    Packs/day: 1.00    Years: 25.00    Pack years: 25.00    Types: Cigarettes    Quit date: 05/19/2018    Years since quitting: 3.1   Smokeless tobacco: Never  Vaping Use   Vaping Use: Never used  Substance and Sexual Activity   Alcohol use: No    Comment: rare   Drug use: No   Sexual activity: Not Currently  Other Topics Concern   Not on file  Social History Narrative   Married.   Moved from [place].   Disabled.   Social Determinants of Health   Financial Resource Strain: Not on file  Food Insecurity: Not on file  Transportation Needs: Not on file  Physical Activity: Not on file  Stress: Not on file  Social  Connections: Not on file  Intimate Partner Violence: Not on file    Past Surgical History:  Procedure Laterality Date   APPENDECTOMY     [place] Left 05/10/2016   Procedure: Left Heart Cath and Coronary Angiography;  Surgeon: Minna Merritts, MD;  Location: [facility];  Service: Cardiovascular;  Laterality: Left;   CARDIAC DEFIBRILLATOR PLACEMENT  10/16/2007   ICD Model number 2207-36 serial number [id number]   CARDIAC ELECTROPHYSIOLOGY STUDY AND ABLATION     CHOLECYSTECTOMY  2001   COLONOSCOPY     COLONOSCOPY WITH PROPOFOL N/A 08/28/2018   Procedure: COLONOSCOPY WITH PROPOFOL;  Surgeon: Jonathon Bellows, MD;  Location: [facility];  Service: Gastroenterology;  Laterality: N/A;   CORONARY ANGIOPLASTY WITH STENT PLACEMENT     7 stents   CORONARY ARTERY BYPASS GRAFT N/A 05/24/2016   Procedure: CORONARY ARTERY BYPASS GRAFTING (CABG) x four , using left internal mammary artery and left leg greater saphenous vein harvested endoscopically;  Surgeon: Gaye Pollack, MD;  Location: [facility];  Service: Open Heart Surgery;  Laterality: N/A;   EP IMPLANTABLE DEVICE N/A 06/16/2015   Procedure: ICD Generator Changeout;  Surgeon: Deboraha Sprang, MD;  Location: [facility];  Service: Cardiovascular;  Laterality: N/A;   ESOPHAGOGASTRODUODENOSCOPY     INSERT / REPLACE / REMOVE PACEMAKER     KNEE SURGERY     bilateral knee    [name] WITHOUT CARDIOVERSION N/A 05/24/2016   Procedure: TRANSESOPHAGEAL ECHOCARDIOGRAM ([name]);  Surgeon: Gaye Pollack, MD;  Location: [facility];  Service: Open Heart Surgery;  Laterality: N/A;   VASECTOMY      Family History  Problem Relation Age of Onset   Heart attack Mother 70   Hypertension Mother    Melanoma Mother    Heart attack Father 69   Hypertension Father    Hypertension Maternal Uncle    Heart disease Maternal Uncle    Heart disease Maternal Grandmother    Stroke Maternal Grandmother     Diabetes Neg Hx     Allergies  Allergen Reactions   Naproxen Other (See Comments), Anxiety and Rash    Causes hyperactivity causes hyperactivity   Morphine And Related Nausea And Vomiting    Morphine only    Current Outpatient Medications on File Prior to Visit  Medication Sig Dispense Refill   aspirin EC 81 MG tablet Take 81 mg by mouth daily.  90 tablet    clopidogrel (PLAVIX) 75 MG tablet TAKE ONE TABLET BY MOUTH DAILY 90 tablet 1  escitalopram (LEXAPRO) 20 MG tablet TAKE 1 TABLET BY MOUTH  DAILY FOR ANXIETY AND  DEPRESSION     ezetimibe (ZETIA) 10 MG tablet TAKE 1 TABLET BY MOUTH  DAILY 90 tablet 2   furosemide (LASIX) 40 MG tablet Take 80 mg in the am & 40 mg in the pm.     gabapentin (NEURONTIN) 600 MG tablet TAKE TWO TABLETS BY MOUTH TWICE A DAY FOR PAIN 360 tablet 1   gemfibrozil (LOPID) 600 MG tablet TAKE 1 TABLET BY MOUTH  DAILY 90 tablet 3   insulin regular human CONCENTRATED (HUMULIN R) 500 UNIT/ML injection Inject 0-25 Units into the skin daily at 12 noon.     methocarbamol (ROBAXIN) 750 MG tablet Take 1 tablet (750 mg total) by mouth every 8 (eight) hours as needed for muscle spasms. 90 tablet 5   metoprolol succinate (TOPROL-XL) 50 MG 24 hr tablet Take 50 mg by mouth daily.     nitroGLYCERIN (NITROSTAT) 0.4 MG SL tablet Place 1 tablet (0.4 mg total) under the tongue every 5 (five) minutes as needed for chest pain. 25 tablet 1   omega-3 acid ethyl esters (LOVAZA) 1 g capsule Take 2 capsules by mouth 2 (two) times daily.     rosuvastatin (CRESTOR) 20 MG tablet TAKE 1 TABLET BY MOUTH AT  BEDTIME FOR CHOLESTEROL 90 tablet 3   sacubitril-valsartan (ENTRESTO) 97-103 MG Take 0.5 tablets by mouth 2 (two) times daily.     Semaglutide, 1 MG/DOSE, (OZEMPIC, 1 MG/DOSE,) 4 MG/3ML SOPN Inject 1 mg into the skin once a week.     sildenafil (REVATIO) 20 MG tablet Take 2 to 5 tablets by mouth 1 hour prior to intercourse as needed. 50 tablet 0   spironolactone (ALDACTONE) 25 MG tablet  Take 25 mg by mouth daily. 1/2 tab bid     traZODone (DESYREL) 100 MG tablet TAKE 2 TABLETS BY MOUTH  EVERY NIGHT AT BEDTIME 180 tablet 3   No current facility-administered medications on file prior to visit.    BP 132/67   Pulse 73   Temp (!) 97.1 F (36.2 C) (Temporal)   Ht 6' (1.829 m)   Wt 270 lb (122.5 kg)   SpO2 97%   BMI 36.62 kg/m  Objective:   Physical Exam Constitutional:      Appearance: He is not ill-appearing.  Cardiovascular:     Rate and Rhythm: Normal rate and regular rhythm.  Pulmonary:     Effort: Pulmonary effort is normal.     Breath sounds: Normal breath sounds. No wheezing or rales.  Musculoskeletal:     Cervical back: Neck supple.  Skin:    General: Skin is warm and dry.  Neurological:     Mental Status: He is alert and oriented to person, place, and time.          Assessment & Plan:      This visit occurred during the SARS-CoV-2 public health emergency.  Safety protocols were in place, including screening questions prior to the visit, additional usage of staff PPE, and extensive cleaning of exam room while observing appropriate contact time as indicated for disinfecting solutions.

## 2021-07-13 NOTE — ED Provider Notes (Signed)
[facility] Williams    CSN: [id number] Arrival date & time: 07/13/21  0859      History   Chief Complaint Chief Complaint  Patient presents with   Generalized Body Aches   Fatigue   Chills    HPI Brian Williams is a 60 y.o. male.  Presents with 6-day history of chills, body aches, fatigue.  He denies fever, rash, ear pain, sore throat, chest pain, cough, shortness of breath, vomiting, diarrhea, or other symptoms.  No treatments at home.  His medical history includes implantable defibrillator, cardiomyopathy, hypertension, heart failure, cardiac stents, CABG, diabetes, chronic back pain.  The history is provided by the patient and medical records.   Past Medical History:  Diagnosis Date   AICD (automatic cardioverter/defibrillator) present    Allergy    Anxiety    takes Xanax as needed   Appendicitis    Arthritis    back,knees,right shoulder   Back pain    Cardiomyopathy ([place])    CHF (congestive heart failure) (HCC)    takes Lasix and Aldactone daily   Coronary artery disease    takes Plavix daily   Depression    takes Zoloft daily   Diabetes mellitus without complication (HCC)    Humulin R and Farxiga daily.Fasting blood sugar runs 140   GERD (gastroesophageal reflux disease)    Headache    History of bronchitis    History of colon polyps    benign   History of hiatal hernia    Hyperlipidemia    takes Fenofibrate,Crestor, and Zetia daily   Hypertension    takes Entresto and Coreg daily   MI (myocardial infarction) ([place]) 2001   Obesity    Peripheral neuropathy    Pneumonia    history of-last time about 14 yrs ago   PONV (postoperative nausea and vomiting)    after knee surgery 25 yrs ago b/p stayed elevated for a while   Presence of permanent cardiac pacemaker    Shortness of breath dyspnea    with exertion   Sleep apnea    uses CPAP nightly   Ventricular tachycardia    s/p RFCA PVCs 2013    Patient Active Problem List   Diagnosis Date Noted    Lumbar degenerative disc disease 05/11/2021   Lumbar facet arthropathy 05/11/2021   Lumbar post-laminectomy syndrome 05/11/2021   Chronic pain syndrome 05/11/2021   Chronic SI joint pain 05/11/2021   Low back pain 05/10/2021   Acute appendicitis 04/03/2021   Depression 04/03/2021   Diarrhea 12/06/2020   Hypotension 12/06/2020   Tingling 12/01/2020   Vision changes 12/01/2020   Dizziness 11/23/2020   Sinusitis, acute 08/08/2020   Diabetic neuropathy associated with type 2 diabetes mellitus ([place]) 08/05/2020   Falls, initial encounter 08/05/2020   Leg pain, bilateral 08/05/2020   Sensory ataxia 08/05/2020   Chronic knee pain 07/25/2020   Myalgia 01/28/2019   Major depressive disorder 07/08/2017   Erectile dysfunction 07/08/2017   Hx of CABG 07/26/2016   Coronary artery disease 05/24/2016   Atherosclerosis of native coronary artery of native heart with stable angina pectoris ([place])    History of coronary artery stent placement    Unstable angina ([place]) 05/04/2016   Obesity (BMI 30-39.9) 04/06/2016   OSA on CPAP 01/06/2016   Diabetes, polyneuropathy ([place]) 01/06/2016   Hidradenitis suppurativa 10/06/2015   Chronic back pain 05/02/2015   Essential hypertension 05/02/2015   Hyperlipidemia 05/02/2015   Generalized anxiety disorder 05/02/2015   ICD (implantable cardioverter-defibrillator) in place  04/20/2015   Congestive dilated cardiomyopathy ([place]) 04/20/2015   Diabetes mellitus type 2, uncontrolled, with complications 50/53/9767   Chronic systolic heart failure ([place]) 09/30/2014    Past Surgical History:  Procedure Laterality Date   APPENDECTOMY     BACK SURGERY     CARDIAC CATHETERIZATION     CARDIAC CATHETERIZATION Left 05/10/2016   Procedure: Left Heart Cath and Coronary Angiography;  Surgeon: Minna Merritts, MD;  Location: [facility];  Service: Cardiovascular;  Laterality: Left;   CARDIAC DEFIBRILLATOR PLACEMENT  10/16/2007   ICD Model number 2207-36 serial  number [id number]   CARDIAC ELECTROPHYSIOLOGY STUDY AND ABLATION     CHOLECYSTECTOMY  2001   COLONOSCOPY     COLONOSCOPY WITH PROPOFOL N/A 08/28/2018   Procedure: COLONOSCOPY WITH PROPOFOL;  Surgeon: Jonathon Bellows, MD;  Location: [facility] ENDOSCOPY;  Service: Gastroenterology;  Laterality: N/A;   CORONARY ANGIOPLASTY WITH STENT PLACEMENT     7 stents   CORONARY ARTERY BYPASS GRAFT N/A 05/24/2016   Procedure: CORONARY ARTERY BYPASS GRAFTING (CABG) x four , using left internal mammary artery and left leg greater saphenous vein harvested endoscopically;  Surgeon: Gaye Pollack, MD;  Location: [place];  Service: Open Heart Surgery;  Laterality: N/A;   EP IMPLANTABLE DEVICE N/A 06/16/2015   Procedure: ICD Generator Changeout;  Surgeon: Deboraha Sprang, MD;  Location: [facility];  Service: Cardiovascular;  Laterality: N/A;   ESOPHAGOGASTRODUODENOSCOPY     INSERT / REPLACE / REMOVE PACEMAKER     KNEE SURGERY     bilateral knee    [name] WITHOUT CARDIOVERSION N/A 05/24/2016   Procedure: TRANSESOPHAGEAL ECHOCARDIOGRAM ([name]);  Surgeon: Gaye Pollack, MD;  Location: [facility];  Service: Open Heart Surgery;  Laterality: N/A;   VASECTOMY         Home Medications    Prior to Admission medications   Medication Sig Start Date End Date Taking? Authorizing Provider  aspirin EC 81 MG tablet Take 81 mg by mouth daily.  06/21/16   Minna Merritts, MD  clopidogrel (PLAVIX) 75 MG tablet TAKE ONE TABLET BY MOUTH DAILY 12/04/18   Minna Merritts, MD  escitalopram (LEXAPRO) 20 MG tablet TAKE 1 TABLET BY MOUTH  DAILY FOR ANXIETY AND  DEPRESSION 05/09/21   Tonia Ghent, MD  ezetimibe (ZETIA) 10 MG tablet TAKE 1 TABLET BY MOUTH  DAILY 09/01/20   Minna Merritts, MD  furosemide (LASIX) 40 MG tablet Take 80 mg in the am & 40 mg in the pm.    [provider]  gabapentin (NEURONTIN) 600 MG tablet TAKE TWO TABLETS BY MOUTH TWICE A DAY FOR PAIN 05/28/21   Pleas Koch, NP  gemfibrozil (LOPID) 600 MG tablet  TAKE 1 TABLET BY MOUTH  DAILY 06/17/20   Pleas Koch, NP  insulin regular human CONCENTRATED (HUMULIN R) 500 UNIT/ML injection Inject 0-25 Units into the skin daily at 12 noon.    [provider]  methocarbamol (ROBAXIN) 750 MG tablet Take 1 tablet (750 mg total) by mouth every 8 (eight) hours as needed for muscle spasms. 06/22/21   Gillis Santa, MD  metoprolol succinate (TOPROL-XL) 50 MG 24 hr tablet Take 50 mg by mouth daily. 03/14/21   [provider]  nitroGLYCERIN (NITROSTAT) 0.4 MG SL tablet Place 1 tablet (0.4 mg total) under the tongue every 5 (five) minutes as needed for chest pain. 04/28/21   Pleas Koch, NP  omega-3 acid ethyl esters (LOVAZA) 1 g  capsule Take 2 capsules by mouth 2 (two) times daily. 03/22/21   [provider]  rosuvastatin (CRESTOR) 20 MG tablet TAKE 1 TABLET BY MOUTH AT  BEDTIME FOR CHOLESTEROL 06/17/20   Pleas Koch, NP  sacubitril-valsartan (ENTRESTO) 97-103 MG Take 0.5 tablets by mouth 2 (two) times daily. 01/18/21 01/18/22  [provider]  Semaglutide, 1 MG/DOSE, (OZEMPIC, 1 MG/DOSE,) 4 MG/3ML SOPN Inject 1 mg into the skin once a week. Patient not taking: Reported on 06/22/2021 11/09/20   [provider]  sildenafil (REVATIO) 20 MG tablet Take 2 to 5 tablets by mouth 1 hour prior to intercourse as needed. 11/15/20   Pleas Koch, NP  spironolactone (ALDACTONE) 25 MG tablet Take 25 mg by mouth daily. 1/2 tab bid    [provider]  traZODone (DESYREL) 100 MG tablet TAKE 2 TABLETS BY MOUTH  EVERY NIGHT AT BEDTIME 06/17/20   Pleas Koch, NP    Family History Family History  Problem Relation Age of Onset   Heart attack Mother 59   Hypertension Mother    Melanoma Mother    Heart attack Father 42   Hypertension Father    Hypertension Maternal Uncle    Heart disease Maternal Uncle    Heart disease Maternal Grandmother    Stroke Maternal Grandmother    Diabetes Neg Hx     Social  History Social History   Tobacco Use   Smoking status: Former    Packs/day: 1.00    Years: 25.00    Pack years: 25.00    Types: Cigarettes    Quit date: 05/19/2018    Years since quitting: 3.1   Smokeless tobacco: Never  Vaping Use   Vaping Use: Never used  Substance Use Topics   Alcohol use: No    Comment: rare   Drug use: No     Allergies   Naproxen and Morphine and related   Review of Systems Review of Systems  Constitutional:  Positive for chills and fatigue. Negative for fever.  HENT:  Negative for ear pain and sore throat.   Respiratory:  Negative for cough and shortness of breath.   Cardiovascular:  Negative for chest pain and palpitations.  Gastrointestinal:  Negative for abdominal pain, diarrhea and vomiting.  Skin:  Negative for color change and rash.  All other systems reviewed and are negative.   Physical Exam Triage Vital Signs ED Triage Vitals  Enc Vitals Group     BP      Pulse      Resp      Temp      Temp src      SpO2      Weight      Height      Head Circumference      Peak Flow      Pain Score      Pain Loc      Pain Edu?      Excl. in [place]?    No data found.  Updated Vital Signs BP 134/67   Pulse 84   Temp 98.3 F (36.8 C) (Oral)   Resp (!) 24   SpO2 98%   Visual Acuity Right Eye Distance:   Left Eye Distance:   Bilateral Distance:    Right Eye Near:   Left Eye Near:    Bilateral Near:     Physical Exam Vitals and nursing note reviewed.  Constitutional:      General: He is not in acute  distress.    Appearance: He is well-developed. He is obese. He is not ill-appearing.  HENT:     Head: Normocephalic and atraumatic.     Right Ear: Tympanic membrane normal.     Left Ear: Tympanic membrane normal.     Nose: Nose normal.     Mouth/Throat:     Mouth: Mucous membranes are moist.     Pharynx: Oropharynx is clear.  Eyes:     Conjunctiva/sclera: Conjunctivae normal.  Cardiovascular:     Rate and Rhythm: Normal rate and  regular rhythm.     Heart sounds: Normal heart sounds.  Pulmonary:     Effort: Pulmonary effort is normal. No respiratory distress.     Breath sounds: Normal breath sounds.  Abdominal:     Palpations: Abdomen is soft.     Tenderness: There is no abdominal tenderness.  Musculoskeletal:     Cervical back: Neck supple.  Skin:    General: Skin is warm and dry.  Neurological:     General: No focal deficit present.     Mental Status: He is alert and oriented to person, place, and time.     Gait: Gait normal.  Psychiatric:        Mood and Affect: Mood normal.        Behavior: Behavior normal.     UC Treatments / Results  Labs (all labs ordered are listed, but only abnormal results are displayed) Labs Reviewed - No data to display  EKG   Radiology No results found.  Procedures Procedures (including critical care time)  Medications Ordered in UC Medications - No data to display  Initial Impression / Assessment and Plan / UC Course  I have reviewed the triage vital signs and the nursing notes.  Pertinent labs & imaging results that were available during my care of the patient were reviewed by me and considered in my medical decision making (see chart for details).  Fatigue, body aches, chills.  Patient is well-appearing and his exam is reassuring.  He is afebrile and vital signs are stable.  He declines COVID or flu testing today.  Instructed him to follow-up with his PCP.  ED precautions discussed.  Patient agrees to plan of care.   Final Clinical Impressions(s) / UC Diagnoses   Final diagnoses:  Fatigue, unspecified type  Body aches  Chills     Discharge Instructions      Schedule an appointment with your primary care provider.    Go to the emergency department if you have acute worsening symptoms.         ED Prescriptions   None    PDMP not reviewed this encounter.   Sharion Balloon, NP 07/13/21 [phone]

## 2021-07-13 NOTE — Patient Instructions (Addendum)
Stop by the lab prior to leaving today. I will notify you of your results once received.   Do not exceed 3000 mg of Tylenol in 24 hours.   Use the anti-nausea medication as needed.   It was a pleasure to see you today!

## 2021-07-13 NOTE — Discharge Instructions (Addendum)
Schedule an appointment with your primary care provider.    Go to the emergency department if you have acute worsening symptoms.

## 2021-07-13 NOTE — Assessment & Plan Note (Signed)
Unclear cause but symptoms and HPI certainly sound to be of viral etiology.  Influenza test negative today. Covid-19 test pending.  Suspect viral GI cause given that he began to feel poorly several hours after eating at a restaurant. He does appear very well and stable today.  Advance diet as tolerated.   Lungs are clear and non cough so no need for chest xray.  Checking CBC and CMP today. Discussed not to exceed 3000 mg in 24 hours.  Encouraged to resume all medications as prescribed.  Await results.

## 2021-07-13 NOTE — ED Triage Notes (Signed)
Pt here with fatigue, chills, and body aches x 6 days. No other sx. Had 4th COVID shot last month and the flu vaccine 2 weeks ago.

## 2021-07-14 ENCOUNTER — Other Ambulatory Visit: Payer: Self-pay | Admitting: Primary Care

## 2021-07-14 ENCOUNTER — Ambulatory Visit: Payer: Medicare Other | Admitting: Primary Care

## 2021-07-14 DIAGNOSIS — D649 Anemia, unspecified: Secondary | ICD-10-CM

## 2021-07-14 DIAGNOSIS — E785 Hyperlipidemia, unspecified: Secondary | ICD-10-CM

## 2021-07-14 LAB — SARS-COV-2, NAA 2 DAY TAT

## 2021-07-14 LAB — NOVEL CORONAVIRUS, NAA: SARS-CoV-2, NAA: NOT DETECTED

## 2021-07-16 DIAGNOSIS — I10 Essential (primary) hypertension: Secondary | ICD-10-CM | POA: Diagnosis not present

## 2021-07-16 DIAGNOSIS — R0681 Apnea, not elsewhere classified: Secondary | ICD-10-CM | POA: Diagnosis not present

## 2021-07-16 DIAGNOSIS — G4733 Obstructive sleep apnea (adult) (pediatric): Secondary | ICD-10-CM | POA: Diagnosis not present

## 2021-07-17 ENCOUNTER — Other Ambulatory Visit: Payer: Self-pay | Admitting: Primary Care

## 2021-07-17 DIAGNOSIS — E785 Hyperlipidemia, unspecified: Secondary | ICD-10-CM

## 2021-07-18 NOTE — Progress Notes (Signed)
Subjective:   Brian Williams is a 60 y.o. male who presents for an Initial Medicare Annual Wellness Visit.  I connected with Talan today by telephone and verified that I am speaking with the correct person using two identifiers. Location patient: home Location provider: work Persons participating in the virtual visit: patient, Marine scientist.    I discussed the limitations, risks, security and privacy concerns of performing an evaluation and management service by telephone and the availability of in person appointments. I also discussed with the patient that there may be a patient responsible charge related to this service. The patient expressed understanding and verbally consented to this telephonic visit.    Interactive audio and video telecommunications were attempted between this provider and patient, however failed, due to patient having technical difficulties OR patient did not have access to video capability.  We continued and completed visit with audio only.  Some vital signs may be absent or patient reported.   Time Spent with patient on telephone encounter: 25 minutes  Review of Systems     Cardiac Risk Factors include: advanced age (>2men, >50 women);diabetes mellitus;hypertension     Objective:    Today's Vitals   07/21/21 1314  Weight: 270 lb (122.5 kg)  Height: 6' (1.829 m)  PainSc: 5    Body mass index is 36.62 kg/m.  Advanced Directives 07/21/2021 05/25/2021 04/04/2021 04/03/2021 12/07/2020 12/06/2020 07/13/2020  Does Patient Have a Medical Advance Directive? Yes Yes - No Yes Yes No  Type of Paramedic of [place];Living will - - - Living will Living will -  Does patient want to make changes to medical advance directive? Yes (MAU/Ambulatory/Procedural Areas - Information given) - - - No - Patient declined - -  Copy of [place] in Chart? No - copy requested - - - - - -  Would patient like information on creating a medical advance  directive? - - No - Patient declined - - - -    Current Medications (verified) Outpatient Encounter Medications as of 07/21/2021  Medication Sig   aspirin EC 81 MG tablet Take 81 mg by mouth daily.    clopidogrel (PLAVIX) 75 MG tablet TAKE ONE TABLET BY MOUTH DAILY   escitalopram (LEXAPRO) 20 MG tablet TAKE 1 TABLET BY MOUTH  DAILY FOR ANXIETY AND  DEPRESSION   ezetimibe (ZETIA) 10 MG tablet TAKE 1 TABLET BY MOUTH  DAILY   furosemide (LASIX) 40 MG tablet Take 80 mg in the am & 40 mg in the pm.   gabapentin (NEURONTIN) 600 MG tablet TAKE TWO TABLETS BY MOUTH TWICE A DAY FOR PAIN   gemfibrozil (LOPID) 600 MG tablet Take 1 tablet (600 mg total) by mouth daily. For cholesterol.   insulin regular human CONCENTRATED (HUMULIN R) 500 UNIT/ML injection Inject 0-25 Units into the skin daily at 12 noon.   methocarbamol (ROBAXIN) 750 MG tablet Take 1 tablet (750 mg total) by mouth every 8 (eight) hours as needed for muscle spasms.   metoprolol succinate (TOPROL-XL) 50 MG 24 hr tablet Take 50 mg by mouth daily.   nitroGLYCERIN (NITROSTAT) 0.4 MG SL tablet Place 1 tablet (0.4 mg total) under the tongue every 5 (five) minutes as needed for chest pain.   omega-3 acid ethyl esters (LOVAZA) 1 g capsule Take 2 capsules by mouth 2 (two) times daily.   rosuvastatin (CRESTOR) 20 MG tablet TAKE 1 TABLET BY MOUTH AT  BEDTIME FOR CHOLESTEROL   sacubitril-valsartan (ENTRESTO) 97-103 MG Take 0.5  tablets by mouth 2 (two) times daily.   Semaglutide, 1 MG/DOSE, (OZEMPIC, 1 MG/DOSE,) 4 MG/3ML SOPN Inject 1 mg into the skin once a week.   sildenafil (REVATIO) 20 MG tablet Take 2 to 5 tablets by mouth 1 hour prior to intercourse as needed.   spironolactone (ALDACTONE) 25 MG tablet Take 25 mg by mouth daily. 1/2 tab bid   traZODone (DESYREL) 100 MG tablet TAKE 2 TABLETS BY MOUTH  EVERY NIGHT AT BEDTIME   No facility-administered encounter medications on file as of 07/21/2021.    Allergies (verified) Naproxen and Morphine  and related   History: Past Medical History:  Diagnosis Date   AICD (automatic cardioverter/defibrillator) present    Allergy    Anxiety    takes Xanax as needed   Appendicitis    Arthritis    back,knees,right shoulder   Back pain    Cardiomyopathy ([place])    CHF (congestive heart failure) (HCC)    takes Lasix and Aldactone daily   Coronary artery disease    takes Plavix daily   Depression    takes Zoloft daily   Diabetes mellitus without complication (HCC)    Humulin R and Farxiga daily.Fasting blood sugar runs 140   GERD (gastroesophageal reflux disease)    Headache    History of bronchitis    History of colon polyps    benign   History of hiatal hernia    Hyperlipidemia    takes Fenofibrate,Crestor, and Zetia daily   Hypertension    takes Entresto and Coreg daily   MI (myocardial infarction) ([place]) 2001   Obesity    Peripheral neuropathy    Pneumonia    history of-last time about 14 yrs ago   PONV (postoperative nausea and vomiting)    after knee surgery 25 yrs ago b/p stayed elevated for a while   Presence of permanent cardiac pacemaker    Shortness of breath dyspnea    with exertion   Sleep apnea    uses CPAP nightly   Ventricular tachycardia    s/p RFCA PVCs 2013   Past Surgical History:  Procedure Laterality Date   APPENDECTOMY     BACK SURGERY     CARDIAC CATHETERIZATION     CARDIAC CATHETERIZATION Left 05/10/2016   Procedure: Left Heart Cath and Coronary Angiography;  Surgeon: Minna Merritts, MD;  Location: [facility];  Service: Cardiovascular;  Laterality: Left;   CARDIAC DEFIBRILLATOR PLACEMENT  10/16/2007   ICD Model number 2207-36 serial number [id number]   CARDIAC ELECTROPHYSIOLOGY STUDY AND ABLATION     CHOLECYSTECTOMY  2001   COLONOSCOPY     COLONOSCOPY WITH PROPOFOL N/A 08/28/2018   Procedure: COLONOSCOPY WITH PROPOFOL;  Surgeon: Jonathon Bellows, MD;  Location: [facility];  Service: Gastroenterology;  Laterality: N/A;   CORONARY  ANGIOPLASTY WITH STENT PLACEMENT     7 stents   CORONARY ARTERY BYPASS GRAFT N/A 05/24/2016   Procedure: CORONARY ARTERY BYPASS GRAFTING (CABG) x four , using left internal mammary artery and left leg greater saphenous vein harvested endoscopically;  Surgeon: Gaye Pollack, MD;  Location: [facility];  Service: Open Heart Surgery;  Laterality: N/A;   EP IMPLANTABLE DEVICE N/A 06/16/2015   Procedure: ICD Generator Changeout;  Surgeon: Deboraha Sprang, MD;  Location: [facility];  Service: Cardiovascular;  Laterality: N/A;   ESOPHAGOGASTRODUODENOSCOPY     INSERT / REPLACE / REMOVE PACEMAKER     KNEE SURGERY     bilateral knee  [name] WITHOUT CARDIOVERSION N/A 05/24/2016   Procedure: TRANSESOPHAGEAL ECHOCARDIOGRAM ([name]);  Surgeon: Gaye Pollack, MD;  Location: [facility];  Service: Open Heart Surgery;  Laterality: N/A;   VASECTOMY     Family History  Problem Relation Age of Onset   Heart attack Mother 31   Hypertension Mother    Melanoma Mother    Heart attack Father 14   Hypertension Father    Hypertension Maternal Uncle    Heart disease Maternal Uncle    Heart disease Maternal Grandmother    Stroke Maternal Grandmother    Diabetes Neg Hx    Social History   Socioeconomic History   Marital status: Married    Spouse name: Timofey Carandang   Number of children: 2   Years of education: Not on file   Highest education level: Not on file  Occupational History   Not on file  Tobacco Use   Smoking status: Former    Packs/day: 1.00    Years: 25.00    Pack years: 25.00    Types: Cigarettes    Quit date: 05/19/2018    Years since quitting: 3.1   Smokeless tobacco: Never  Vaping Use   Vaping Use: Never used  Substance and Sexual Activity   Alcohol use: No    Comment: rare   Drug use: No   Sexual activity: Not Currently  Other Topics Concern   Not on file  Social History Narrative   Married.   Moved from [place].   Disabled.   Social Determinants of Health   Financial  Resource Strain: Low Risk    Difficulty of Paying Living Expenses: Not hard at all  Food Insecurity: No Food Insecurity   Worried About Charity fundraiser in the Last Year: Never true   [place] in the Last Year: Never true  Transportation Needs: No Transportation Needs   Lack of Transportation (Medical): No   Lack of Transportation (Non-Medical): No  Physical Activity: Inactive   Days of Exercise per Week: 0 days   Minutes of Exercise per Session: 0 min  Stress: No Stress Concern Present   Feeling of Stress : Not at all  Social Connections: Socially Isolated   Frequency of Communication with Friends and Family: Never   Frequency of Social Gatherings with Friends and Family: Never   Attends Religious Services: Never   Marine scientist or Organizations: No   Attends Music therapist: Never   Marital Status: Married    Tobacco Counseling Counseling given: Not Answered   Clinical Intake:  Pre-visit preparation completed: Yes  Pain : 0-10 Pain Score: 5  Pain Location: Leg (both legs)     BMI - recorded: 36.61 Nutritional Status: BMI > 30  Obese Nutritional Risks: None Diabetes: Yes CBG done?: No (visit completed over the phone) Did pt. bring in CBG monitor from home?: No  How often do you need to have someone help you when you read instructions, pamphlets, or other written materials from your doctor or pharmacy?: 1 - Never  Diabetes:  Is the patient diabetic?  Yes  If diabetic, was a CBG obtained today?  No , visit completed over the phone. Did the patient bring in their glucometer from home?  No , visit completed over the phone. How often do you monitor your CBG's? 6-7x daily.   Financial Strains and Diabetes Management:  Are you having any financial strains with the device, your supplies or your medication? No .  Does the patient want to be seen by Chronic Care Management for management of their diabetes?  No  Would the patient like to  be referred to a Nutritionist or for Diabetic Management?  No   Diabetic Exams:  Diabetic Eye Exam: Completed 05/12/21.  Diabetic Foot Exam: Due, Pt has been advised about the importance in completing this exam.   Interpreter Needed?: No  Information entered by :: Orrin Brigham LPN   Activities of Daily Living In your present state of health, do you have any difficulty performing the following activities: 07/21/2021 04/03/2021  Hearing? N N  Vision? N N  Difficulty concentrating or making decisions? N N  Walking or climbing stairs? Y N  Comment uses cane -  Dressing or bathing? N N  Doing errands, shopping? N N  Preparing Food and eating ? N -  Using the Toilet? N -  In the past six months, have you accidently leaked urine? N -  Do you have problems with loss of bowel control? N -  Managing your Medications? N -  Managing your Finances? N -  Housekeeping or managing your Housekeeping? N -  Some recent data might be hidden    Patient Care Team: Pleas Koch, NP as PCP - General (Internal Medicine) Rockey Situ Kathlene November, MD as PCP - Cardiology (Cardiology) Minna Merritts, MD as Consulting Physician (Cardiology) Radene Knee Ladell Pier, MD as Consulting Physician (Cardiology) Jamelle Rushing, MD as Referring Physician (Dermatology) Charlton Haws, [facility] as Pharmacist (Pharmacist) Bryson Ha, OD (Optometry)  Indicate any recent Medical Services you may have received from other than [facility] providers in the past year (date may be approximate).     Assessment:   This is a routine wellness examination for [place].  Hearing/Vision screen Hearing Screening - Comments:: No issues Vision Screening - Comments:: Last eye exam 05/12/2021, [place], wears glasses as needed  Dietary issues and exercise activities discussed: Current Exercise Habits: The patient does not participate in regular exercise at present   Goals Addressed             This Visit's Progress     Patient Stated       Would like to decrease weight to 235.       Depression Screen PHQ 2/9 Scores 07/21/2021 05/25/2021 02/17/2020 01/18/2020 07/23/2019 07/08/2017 07/16/2016  PHQ - 2 Score 0 0 2 3 4 6 4   PHQ- 9 Score - - 16 18 15 21 18   Exception Documentation - - - - - - (No Data)    Fall Risk Fall Risk  07/21/2021 06/22/2021 05/25/2021 05/11/2021 05/09/2021  Falls in the past year? 1 0 0 1 0  Number falls in past yr: 1 - - 0 0  Comment - - - - -  Injury with Fall? 0 - - 0 0  Risk for fall due to : Other (Comment) - - - -  Risk for fall due to: Comment - - - - -  Follow up Falls prevention discussed - - - -    FALL RISK PREVENTION PERTAINING TO THE HOME:  Any stairs in or around the home? No  If so, are there any without handrails?  N/A Home free of loose throw rugs in walkways, pet beds, electrical cords, etc? Yes  Adequate lighting in your home to reduce risk of falls? Yes   ASSISTIVE DEVICES UTILIZED TO PREVENT FALLS:  Life alert? No  Use of a cane, walker or w/c? Yes , cane  Grab bars in the bathroom? Yes  Shower chair or bench in shower? Yes  Elevated toilet seat or a handicapped toilet? Yes   TIMED UP AND GO:  Was the test performed? No , visit completed over the phone.   Cognitive Function: Normal cognitive status assessed by this Nurse Health Advisor. No abnormalities found.     Montreal Cognitive Assessment  01/06/2016  Visuospatial/ Executive (0/5) 4  Naming (0/3) 3  Attention: Read list of digits (0/2) 2  Attention: Read list of letters (0/1) 1  Attention: Serial 7 subtraction starting at 100 (0/3) 3  Language: Repeat phrase (0/2) 2  Language : Fluency (0/1) 1  Abstraction (0/2) 2  Delayed Recall (0/5) 4  Orientation (0/6) 6  Total 28  Adjusted Score (based on education) 28      Immunizations Immunization History  Administered Date(s) Administered   Influenza Inj Mdck Quad Pf 06/25/2018, 06/05/2019   Influenza Split 07/22/2015   Influenza,inj,Quad PF,6+  Mos 06/13/2016, 06/24/2020   Influenza-Unspecified 06/25/2018, 06/06/2019   Moderna Sars-Covid-2 Vaccination 10/31/2019, 11/21/2019   Pneumococcal Polysaccharide-23 07/21/2018   Td 04/17/2018   Tdap 02/22/2018    TDAP status: Up to date  Flu Vaccine status: Up to date  Pneumococcal vaccine status: Up to date  Covid-19 vaccine status: Completed vaccines  Qualifies for Shingles Vaccine? Yes   Zostavax completed No   Shingrix Completed?: Yes  Screening Tests Health Maintenance  Topic Date Due   Zoster Vaccines- Shingrix (1 of 2) Never done   Pneumococcal Vaccine 54-49 Years old (2 - PCV) 07/22/2019   FOOT EXAM  02/03/2021   INFLUENZA VACCINE  12/15/2021 (Originally 04/17/2021)   HEMOGLOBIN A1C  08/26/2021   COLONOSCOPY (Pts 45-19yrs Insurance coverage will need to be confirmed)  08/28/2021   OPHTHALMOLOGY EXAM  05/12/2022   TETANUS/TDAP  04/17/2028   COVID-19 Vaccine  Completed   HIV Screening  Completed   Hepatitis C Screening  Addressed   HPV VACCINES  Aged Out    Health Maintenance  Health Maintenance Due  Topic Date Due   Zoster Vaccines- Shingrix (1 of 2) Never done   Pneumococcal Vaccine 27-3 Years old (2 - PCV) 07/22/2019   FOOT EXAM  02/03/2021    Colorectal Cancer screening: Due, last exam 09/04/18, repeat in 3 years. Patient plans to schedule.  Lung Cancer Screening: (Low Dose CT Chest recommended if Age 57-80 years, 30 pack-year currently smoking OR have quit w/in 15years.) does not qualify.     Additional Screening:  Hepatitis C Screening: does qualify; Completed 10/06/15  Vision Screening: Recommended annual ophthalmology exams for early detection of glaucoma and other disorders of the eye. Is the patient up to date with their annual eye exam?  Yes  Who is the provider or what is the name of the office in which the patient attends annual eye exams? [facility] Screening: Recommended annual dental exams for proper oral hygiene  Community  Resource Referral / Chronic Care Management: CRR required this visit?  No   CCM required this visit?  No      Plan:     I have personally reviewed and noted the following in the patient's chart:   Medical and social history Use of alcohol, tobacco or illicit drugs  Current medications and supplements including opioid prescriptions. Patient is not currently taking opioid prescriptions. Functional ability and status Nutritional status Physical activity Advanced directives List of other physicians Hospitalizations, surgeries, and ER visits in previous 12 months Vitals  Screenings to include cognitive, depression, and falls Referrals and appointments  In addition, I have reviewed and discussed with patient certain preventive protocols, quality metrics, and best practice recommendations. A written personalized care plan for preventive services as well as general preventive health recommendations were provided to patient.   Due to this being a telephonic visit, the after visit summary with patients personalized plan was offered to patient via mail or my-chart. Patient would like to access on my-chart.    Loma Messing, LPN   09/23/5100   Nurse Health Advisor  Nurse Notes: None

## 2021-07-21 ENCOUNTER — Ambulatory Visit (INDEPENDENT_AMBULATORY_CARE_PROVIDER_SITE_OTHER): Payer: Medicare Other

## 2021-07-21 VITALS — Ht 72.0 in | Wt 270.0 lb

## 2021-07-21 DIAGNOSIS — Z0000 Encounter for general adult medical examination without abnormal findings: Secondary | ICD-10-CM

## 2021-07-21 NOTE — Patient Instructions (Signed)
Brian Williams , Thank you for taking time to complete your Medicare Wellness Visit. I appreciate your ongoing commitment to your health goals. Please review the following plan we discussed and let me know if I can assist you in the future.   Screening recommendations/referrals: Colonoscopy: Due 08/28/21, last completed 08/28/18, you plan to schedule  Recommended yearly ophthalmology/optometry visit for glaucoma screening and checkup Recommended yearly dental visit for hygiene and checkup  Vaccinations: Influenza vaccine: up to date , please bring vaccine information for your chart Pneumococcal vaccine: up to date Tdap vaccine: up to date, completed 02/22/18, Due 02/23/28  Shingles vaccine: up to date, please bring vaccine information for your chart   Covid-19: up to date  Advanced directives: Please bring a copy of Living Will and/or [place] for your chart.   Conditions/risks identified: see problem list  Next appointment: Follow up in one year for your annual wellness visit 07/25/22 @ 9:00am, this will be a telephone visit per your request  Preventive Care 40-64 Years, Male Preventive care refers to lifestyle choices and visits with your health care provider that can promote health and wellness. What does preventive care include? A yearly physical exam. This is also called an annual well check. Dental exams once or twice a year. Routine eye exams. Ask your health care provider how often you should have your eyes checked. Personal lifestyle choices, including: Daily care of your teeth and gums. Regular physical activity. Eating a healthy diet. Avoiding tobacco and drug use. Limiting alcohol use. Practicing safe sex. Taking low-dose aspirin every day starting at age 4. What happens during an annual well check? The services and screenings done by your health care provider during your annual well check will depend on your age, overall health, lifestyle risk factors, and  family history of disease. Counseling  Your health care provider may ask you questions about your: Alcohol use. Tobacco use. Drug use. Emotional well-being. Home and relationship well-being. Sexual activity. Eating habits. Work and work Statistician. Screening  You may have the following tests or measurements: Height, weight, and BMI. Blood pressure. Lipid and cholesterol levels. These may be checked every 5 years, or more frequently if you are over 15 years old. Skin check. Lung cancer screening. You may have this screening every year starting at age 59 if you have a 30-pack-year history of smoking and currently smoke or have quit within the past 15 years. Fecal occult blood test (FOBT) of the stool. You may have this test every year starting at age 86. Flexible sigmoidoscopy or colonoscopy. You may have a sigmoidoscopy every 5 years or a colonoscopy every 10 years starting at age 30. Prostate cancer screening. Recommendations will vary depending on your family history and other risks. Hepatitis C blood test. Hepatitis B blood test. Sexually transmitted disease (STD) testing. Diabetes screening. This is done by checking your blood sugar (glucose) after you have not eaten for a while (fasting). You may have this done every 1-3 years. Discuss your test results, treatment options, and if necessary, the need for more tests with your health care provider. Vaccines  Your health care provider may recommend certain vaccines, such as: Influenza vaccine. This is recommended every year. Tetanus, diphtheria, and acellular pertussis (Tdap, Td) vaccine. You may need a Td booster every 10 years. Zoster vaccine. You may need this after age 57. Pneumococcal 13-valent conjugate (PCV13) vaccine. You may need this if you have certain conditions and have not been vaccinated. Pneumococcal polysaccharide (PPSV23) vaccine.  You may need one or two doses if you smoke cigarettes or if you have certain  conditions. Talk to your health care provider about which screenings and vaccines you need and how often you need them. This information is not intended to replace advice given to you by your health care provider. Make sure you discuss any questions you have with your health care provider. Document Released: 09/30/2015 Document Revised: 05/23/2016 Document Reviewed: 07/05/2015 Elsevier Interactive Patient Education  2017 [place] Prevention in the Home Falls can cause injuries. They can happen to people of all ages. There are many things you can do to make your home safe and to help prevent falls. What can I do on the outside of my home? Regularly fix the edges of walkways and driveways and fix any cracks. Remove anything that might make you trip as you walk through a door, such as a raised step or threshold. Trim any bushes or trees on the path to your home. Use bright outdoor lighting. Clear any walking paths of anything that might make someone trip, such as rocks or tools. Regularly check to see if handrails are loose or broken. Make sure that both sides of any steps have handrails. Any raised decks and porches should have guardrails on the edges. Have any leaves, snow, or ice cleared regularly. Use sand or salt on walking paths during winter. Clean up any spills in your garage right away. This includes oil or grease spills. What can I do in the bathroom? Use night lights. Install grab bars by the toilet and in the tub and shower. Do not use towel bars as grab bars. Use non-skid mats or decals in the tub or shower. If you need to sit down in the shower, use a plastic, non-slip stool. Keep the floor dry. Clean up any water that spills on the floor as soon as it happens. Remove soap buildup in the tub or shower regularly. Attach bath mats securely with double-sided non-slip rug tape. Do not have throw rugs and other things on the floor that can make you trip. What can I do in  the bedroom? Use night lights. Make sure that you have a light by your bed that is easy to reach. Do not use any sheets or blankets that are too big for your bed. They should not hang down onto the floor. Have a firm chair that has side arms. You can use this for support while you get dressed. Do not have throw rugs and other things on the floor that can make you trip. What can I do in the kitchen? Clean up any spills right away. Avoid walking on wet floors. Keep items that you use a lot in easy-to-reach places. If you need to reach something above you, use a strong step stool that has a grab bar. Keep electrical cords out of the way. Do not use floor polish or wax that makes floors slippery. If you must use wax, use non-skid floor wax. Do not have throw rugs and other things on the floor that can make you trip. What can I do with my stairs? Do not leave any items on the stairs. Make sure that there are handrails on both sides of the stairs and use them. Fix handrails that are broken or loose. Make sure that handrails are as long as the stairways. Check any carpeting to make sure that it is firmly attached to the stairs. Fix any carpet that is loose or worn.  Avoid having throw rugs at the top or bottom of the stairs. If you do have throw rugs, attach them to the floor with carpet tape. Make sure that you have a light switch at the top of the stairs and the bottom of the stairs. If you do not have them, ask someone to add them for you. What else can I do to help prevent falls? Wear shoes that: Do not have high heels. Have rubber bottoms. Are comfortable and fit you well. Are closed at the toe. Do not wear sandals. If you use a stepladder: Make sure that it is fully opened. Do not climb a closed stepladder. Make sure that both sides of the stepladder are locked into place. Ask someone to hold it for you, if possible. Clearly mark and make sure that you can see: Any grab bars or  handrails. First and last steps. Where the edge of each step is. Use tools that help you move around (mobility aids) if they are needed. These include: Canes. Walkers. Scooters. Crutches. Turn on the lights when you go into a dark area. Replace any light bulbs as soon as they burn out. Set up your furniture so you have a clear path. Avoid moving your furniture around. If any of your floors are uneven, fix them. If there are any pets around you, be aware of where they are. Review your medicines with your doctor. Some medicines can make you feel dizzy. This can increase your chance of falling. Ask your doctor what other things that you can do to help prevent falls. This information is not intended to replace advice given to you by your health care provider. Make sure you discuss any questions you have with your health care provider. Document Released: 06/30/2009 Document Revised: 02/09/2016 Document Reviewed: 10/08/2014 Elsevier Interactive Patient Education  2017 [employer].

## 2021-07-25 ENCOUNTER — Other Ambulatory Visit (INDEPENDENT_AMBULATORY_CARE_PROVIDER_SITE_OTHER): Payer: Medicare Other

## 2021-07-25 ENCOUNTER — Other Ambulatory Visit: Payer: Self-pay

## 2021-07-25 DIAGNOSIS — D649 Anemia, unspecified: Secondary | ICD-10-CM

## 2021-07-25 DIAGNOSIS — D72820 Lymphocytosis (symptomatic): Secondary | ICD-10-CM | POA: Diagnosis not present

## 2021-07-25 LAB — CBC WITH DIFFERENTIAL/PLATELET
Basophils Absolute: 0 10*3/uL (ref 0.0–0.1)
Basophils Relative: 0.4 % (ref 0.0–3.0)
Eosinophils Absolute: 0.2 10*3/uL (ref 0.0–0.7)
Eosinophils Relative: 2.9 % (ref 0.0–5.0)
HCT: 33.5 % — ABNORMAL LOW (ref 39.0–52.0)
Hemoglobin: 11.7 g/dL — ABNORMAL LOW (ref 13.0–17.0)
Lymphocytes Relative: 50.6 % — ABNORMAL HIGH (ref 12.0–46.0)
Lymphs Abs: 4 10*3/uL (ref 0.7–4.0)
MCHC: 35.1 g/dL (ref 30.0–36.0)
MCV: 91.6 fl (ref 78.0–100.0)
Monocytes Absolute: 0.4 10*3/uL (ref 0.1–1.0)
Monocytes Relative: 4.8 % (ref 3.0–12.0)
Neutro Abs: 3.2 10*3/uL (ref 1.4–7.7)
Neutrophils Relative %: 41.3 % — ABNORMAL LOW (ref 43.0–77.0)
Platelets: 158 10*3/uL (ref 150.0–400.0)
RBC: 3.65 Mil/uL — ABNORMAL LOW (ref 4.22–5.81)
RDW: 14 % (ref 11.5–15.5)
WBC: 7.8 10*3/uL (ref 4.0–10.5)

## 2021-07-25 LAB — IBC + FERRITIN
Ferritin: 138.7 ng/mL (ref 22.0–322.0)
Iron: 123 ug/dL (ref 42–165)
Saturation Ratios: 25.4 % (ref 20.0–50.0)
TIBC: 484.4 ug/dL — ABNORMAL HIGH (ref 250.0–450.0)
Transferrin: 346 mg/dL (ref 212.0–360.0)

## 2021-07-25 LAB — VITAMIN B12: Vitamin B-12: 287 pg/mL (ref 211–911)

## 2021-07-26 LAB — PATHOLOGIST SMEAR REVIEW

## 2021-07-27 ENCOUNTER — Encounter: Payer: Medicare Other | Admitting: Primary Care

## 2021-07-28 ENCOUNTER — Telehealth: Payer: Self-pay

## 2021-07-28 NOTE — Chronic Care Management (AMB) (Signed)
    Chronic Care Management Pharmacy Assistant   Name: Brian Williams  MRN: [id number] DOB: 19-Oct-1960   Reason for Encounter: Reminder Call   Conditions to be addressed/monitored: CAD, HTN, HLD, and DMII   Medications: Outpatient Encounter Medications as of 07/28/2021  Medication Sig   aspirin EC 81 MG tablet Take 81 mg by mouth daily.    clopidogrel (PLAVIX) 75 MG tablet TAKE ONE TABLET BY MOUTH DAILY   escitalopram (LEXAPRO) 20 MG tablet TAKE 1 TABLET BY MOUTH  DAILY FOR ANXIETY AND  DEPRESSION   ezetimibe (ZETIA) 10 MG tablet TAKE 1 TABLET BY MOUTH  DAILY   furosemide (LASIX) 40 MG tablet Take 80 mg in the am & 40 mg in the pm.   gabapentin (NEURONTIN) 600 MG tablet TAKE TWO TABLETS BY MOUTH TWICE A DAY FOR PAIN   gemfibrozil (LOPID) 600 MG tablet Take 1 tablet (600 mg total) by mouth daily. For cholesterol.   insulin regular human CONCENTRATED (HUMULIN R) 500 UNIT/ML injection Inject 0-25 Units into the skin daily at 12 noon.   methocarbamol (ROBAXIN) 750 MG tablet Take 1 tablet (750 mg total) by mouth every 8 (eight) hours as needed for muscle spasms.   metoprolol succinate (TOPROL-XL) 50 MG 24 hr tablet Take 50 mg by mouth daily.   nitroGLYCERIN (NITROSTAT) 0.4 MG SL tablet Place 1 tablet (0.4 mg total) under the tongue every 5 (five) minutes as needed for chest pain.   omega-3 acid ethyl esters (LOVAZA) 1 g capsule Take 2 capsules by mouth 2 (two) times daily.   rosuvastatin (CRESTOR) 20 MG tablet TAKE 1 TABLET BY MOUTH AT  BEDTIME FOR CHOLESTEROL   sacubitril-valsartan (ENTRESTO) 97-103 MG Take 0.5 tablets by mouth 2 (two) times daily.   Semaglutide, 1 MG/DOSE, (OZEMPIC, 1 MG/DOSE,) 4 MG/3ML SOPN Inject 1 mg into the skin once a week.   sildenafil (REVATIO) 20 MG tablet Take 2 to 5 tablets by mouth 1 hour prior to intercourse as needed.   spironolactone (ALDACTONE) 25 MG tablet Take 25 mg by mouth daily. 1/2 tab bid   traZODone (DESYREL) 100 MG tablet TAKE 2 TABLETS BY MOUTH   EVERY NIGHT AT BEDTIME   No facility-administered encounter medications on file as of 07/28/2021.   Brian Williams was contacted to remind him of his upcoming telephone visit with Brian Williams on 08/04/21 at 1:00. Patient was reminded to have all medications, supplements and any blood glucose and blood pressure readings available for review at appointment.   Are you having any problems with your medications? No  Do you have any concerns you like to discuss with the pharmacist? No    Star Rating Drugs: Medication:  Last Fill: Day Supply Humulin R  PAP  Entresto 97-103 mg 01/13/20 90 Ozempic   PAP Rosuvastatin 20mg  04/20/21  90  Brian Williams, CPP notified  Brian Williams, [place] Assistant [phone]  Total time spent for month CPA: 10 min

## 2021-07-29 ENCOUNTER — Emergency Department
Admission: EM | Admit: 2021-07-29 | Discharge: 2021-07-29 | Disposition: A | Discharge status: Home / Self Care | Payer: Medicare Other | Attending: Emergency Medicine | Admitting: Emergency Medicine

## 2021-07-29 ENCOUNTER — Other Ambulatory Visit: Payer: Self-pay

## 2021-07-29 DIAGNOSIS — S0993XA Unspecified injury of face, initial encounter: Secondary | ICD-10-CM | POA: Diagnosis present

## 2021-07-29 DIAGNOSIS — S0181XA Laceration without foreign body of other part of head, initial encounter: Secondary | ICD-10-CM | POA: Insufficient documentation

## 2021-07-29 DIAGNOSIS — I110 Hypertensive heart disease with heart failure: Secondary | ICD-10-CM | POA: Insufficient documentation

## 2021-07-29 DIAGNOSIS — I5022 Chronic systolic (congestive) heart failure: Secondary | ICD-10-CM | POA: Insufficient documentation

## 2021-07-29 DIAGNOSIS — Z7982 Long term (current) use of aspirin: Secondary | ICD-10-CM | POA: Diagnosis not present

## 2021-07-29 DIAGNOSIS — Z950 Presence of cardiac pacemaker: Secondary | ICD-10-CM | POA: Diagnosis not present

## 2021-07-29 DIAGNOSIS — Z794 Long term (current) use of insulin: Secondary | ICD-10-CM | POA: Insufficient documentation

## 2021-07-29 DIAGNOSIS — Z7902 Long term (current) use of antithrombotics/antiplatelets: Secondary | ICD-10-CM | POA: Insufficient documentation

## 2021-07-29 DIAGNOSIS — I2510 Atherosclerotic heart disease of native coronary artery without angina pectoris: Secondary | ICD-10-CM | POA: Diagnosis not present

## 2021-07-29 DIAGNOSIS — E1140 Type 2 diabetes mellitus with diabetic neuropathy, unspecified: Secondary | ICD-10-CM | POA: Diagnosis not present

## 2021-07-29 DIAGNOSIS — Z951 Presence of aortocoronary bypass graft: Secondary | ICD-10-CM | POA: Diagnosis not present

## 2021-07-29 DIAGNOSIS — Z87891 Personal history of nicotine dependence: Secondary | ICD-10-CM | POA: Insufficient documentation

## 2021-07-29 DIAGNOSIS — Z7984 Long term (current) use of oral hypoglycemic drugs: Secondary | ICD-10-CM | POA: Insufficient documentation

## 2021-07-29 DIAGNOSIS — W268XXA Contact with other sharp object(s), not elsewhere classified, initial encounter: Secondary | ICD-10-CM | POA: Insufficient documentation

## 2021-07-29 DIAGNOSIS — Z79899 Other long term (current) drug therapy: Secondary | ICD-10-CM | POA: Diagnosis not present

## 2021-07-29 DIAGNOSIS — S01411A Laceration without foreign body of right cheek and temporomandibular area, initial encounter: Secondary | ICD-10-CM | POA: Diagnosis not present

## 2021-07-29 MED ORDER — LIDOCAINE-EPINEPHRINE-TETRACAINE (LET) TOPICAL GEL
3.0000 mL | Freq: Once | TOPICAL | Status: DC
  Filled 2021-07-29: qty 3

## 2021-07-29 NOTE — Discharge Instructions (Signed)
Avoid any lotion, cream, ointment, or rolls over the glue area.

## 2021-07-29 NOTE — ED Triage Notes (Signed)
Pt states he cut himself on a regular razor around 12 pm today and laceration has not stopped oozing blood. Pt was advised to go to ED by PCP. Pt is AOX4, NAD noted. Mild abrasion noted. Pt on plavix and baby ASA.

## 2021-07-29 NOTE — ED Notes (Signed)
Dc  provided. Followup information provided. Verbal consent for dc provided. Pt off unit with family on foot

## 2021-07-29 NOTE — ED Provider Notes (Signed)
[facility] Emergency Department Provider Note ____________________________________________  Time seen: 2224  I have reviewed the triage vital signs and the nursing notes.  HISTORY  Chief Complaint  Laceration   HPI Brian Williams is a 60 y.o. male with below medical history including an AICD and coronary artery disease on Plavix, presents to the ED for management of a superficial laceration that continues to bleed.  Patient was apparently shaved himself with a razor around noon today, when he created a small laceration that is continued to bleed.  Patient was advised by his PCP to report to the ED for evaluation.  Denies any other injury at this time.  Past Medical History:  Diagnosis Date   AICD (automatic cardioverter/defibrillator) present    Allergy    Anxiety    takes Xanax as needed   Appendicitis    Arthritis    back,knees,right shoulder   Back pain    Cardiomyopathy ([place])    CHF (congestive heart failure) (HCC)    takes Lasix and Aldactone daily   Coronary artery disease    takes Plavix daily   Depression    takes Zoloft daily   Diabetes mellitus without complication (HCC)    Humulin R and Farxiga daily.Fasting blood sugar runs 140   GERD (gastroesophageal reflux disease)    Headache    History of bronchitis    History of colon polyps    benign   History of hiatal hernia    Hyperlipidemia    takes Fenofibrate,Crestor, and Zetia daily   Hypertension    takes Entresto and Coreg daily   MI (myocardial infarction) ([place]) 2001   Obesity    Peripheral neuropathy    Pneumonia    history of-last time about 14 yrs ago   PONV (postoperative nausea and vomiting)    after knee surgery 25 yrs ago b/p stayed elevated for a while   Presence of permanent cardiac pacemaker    Shortness of breath dyspnea    with exertion   Sleep apnea    uses CPAP nightly   Ventricular tachycardia    s/p RFCA PVCs 2013    Patient Active Problem List    Diagnosis Date Noted   Viral illness 07/13/2021   Lumbar degenerative disc disease 05/11/2021   Lumbar facet arthropathy 05/11/2021   Lumbar post-laminectomy syndrome 05/11/2021   Chronic pain syndrome 05/11/2021   Chronic SI joint pain 05/11/2021   Low back pain 05/10/2021   Acute appendicitis 04/03/2021   Depression 04/03/2021   Diarrhea 12/06/2020   Hypotension 12/06/2020   Tingling 12/01/2020   Vision changes 12/01/2020   Dizziness 11/23/2020   Sinusitis, acute 08/08/2020   Diabetic neuropathy associated with type 2 diabetes mellitus ([place]) 08/05/2020   Falls, initial encounter 08/05/2020   Leg pain, bilateral 08/05/2020   Sensory ataxia 08/05/2020   Chronic knee pain 07/25/2020   Myalgia 01/28/2019   Major depressive disorder 07/08/2017   Erectile dysfunction 07/08/2017   Hx of CABG 07/26/2016   Coronary artery disease 05/24/2016   Atherosclerosis of native coronary artery of native heart with stable angina pectoris ([place])    History of coronary artery stent placement    Unstable angina ([place]) 05/04/2016   Obesity (BMI 30-39.9) 04/06/2016   OSA on CPAP 01/06/2016   Diabetes, polyneuropathy ([place]) 01/06/2016   Hidradenitis suppurativa 10/06/2015   Chronic back pain 05/02/2015   Essential hypertension 05/02/2015   Hyperlipidemia 05/02/2015   Generalized anxiety disorder 05/02/2015   ICD (implantable cardioverter-defibrillator) in  place 04/20/2015   Congestive dilated cardiomyopathy ([place]) 04/20/2015   Diabetes mellitus type 2, uncontrolled, with complications 64/40/3474   Chronic systolic heart failure ([place]) 09/30/2014    Past Surgical History:  Procedure Laterality Date   APPENDECTOMY     BACK SURGERY     CARDIAC CATHETERIZATION     CARDIAC CATHETERIZATION Left 05/10/2016   Procedure: Left Heart Cath and Coronary Angiography;  Surgeon: Minna Merritts, MD;  Location: [facility];  Service: Cardiovascular;  Laterality: Left;   CARDIAC DEFIBRILLATOR PLACEMENT   10/16/2007   ICD Model number 2207-36 serial number [id number]   CARDIAC ELECTROPHYSIOLOGY STUDY AND ABLATION     CHOLECYSTECTOMY  2001   COLONOSCOPY     COLONOSCOPY WITH PROPOFOL N/A 08/28/2018   Procedure: COLONOSCOPY WITH PROPOFOL;  Surgeon: Jonathon Bellows, MD;  Location: [facility];  Service: Gastroenterology;  Laterality: N/A;   CORONARY ANGIOPLASTY WITH STENT PLACEMENT     7 stents   CORONARY ARTERY BYPASS GRAFT N/A 05/24/2016   Procedure: CORONARY ARTERY BYPASS GRAFTING (CABG) x four , using left internal mammary artery and left leg greater saphenous vein harvested endoscopically;  Surgeon: Gaye Pollack, MD;  Location: [place];  Service: Open Heart Surgery;  Laterality: N/A;   EP IMPLANTABLE DEVICE N/A 06/16/2015   Procedure: ICD Generator Changeout;  Surgeon: Deboraha Sprang, MD;  Location: [facility];  Service: Cardiovascular;  Laterality: N/A;   ESOPHAGOGASTRODUODENOSCOPY     INSERT / REPLACE / REMOVE PACEMAKER     KNEE SURGERY     bilateral knee    [name] WITHOUT CARDIOVERSION N/A 05/24/2016   Procedure: TRANSESOPHAGEAL ECHOCARDIOGRAM ([name]);  Surgeon: Gaye Pollack, MD;  Location: [place];  Service: Open Heart Surgery;  Laterality: N/A;   VASECTOMY      Prior to Admission medications   Medication Sig Start Date End Date Taking? Authorizing Provider  aspirin EC 81 MG tablet Take 81 mg by mouth daily.  06/21/16   Minna Merritts, MD  clopidogrel (PLAVIX) 75 MG tablet TAKE ONE TABLET BY MOUTH DAILY 12/04/18   Minna Merritts, MD  escitalopram (LEXAPRO) 20 MG tablet TAKE 1 TABLET BY MOUTH  DAILY FOR ANXIETY AND  DEPRESSION 05/09/21   Tonia Ghent, MD  ezetimibe (ZETIA) 10 MG tablet TAKE 1 TABLET BY MOUTH  DAILY 09/01/20   Minna Merritts, MD  furosemide (LASIX) 40 MG tablet Take 80 mg in the am & 40 mg in the pm.    [provider]  gabapentin (NEURONTIN) 600 MG tablet TAKE TWO TABLETS BY MOUTH TWICE A DAY FOR PAIN 05/28/21   Pleas Koch, NP  gemfibrozil  (LOPID) 600 MG tablet Take 1 tablet (600 mg total) by mouth daily. For cholesterol. 07/14/21   Pleas Koch, NP  insulin regular human CONCENTRATED (HUMULIN R) 500 UNIT/ML injection Inject 0-25 Units into the skin daily at 12 noon.    [provider]  methocarbamol (ROBAXIN) 750 MG tablet Take 1 tablet (750 mg total) by mouth every 8 (eight) hours as needed for muscle spasms. 06/22/21   Gillis Santa, MD  metoprolol succinate (TOPROL-XL) 50 MG 24 hr tablet Take 50 mg by mouth daily. 03/14/21   [provider]  nitroGLYCERIN (NITROSTAT) 0.4 MG SL tablet Place 1 tablet (0.4 mg total) under the tongue every 5 (five) minutes as needed for chest pain. 04/28/21   Pleas Koch, NP  omega-3 acid ethyl esters (LOVAZA) 1 g capsule Take 2  capsules by mouth 2 (two) times daily. 03/22/21   [provider]  rosuvastatin (CRESTOR) 20 MG tablet TAKE 1 TABLET BY MOUTH AT  BEDTIME FOR CHOLESTEROL 06/17/20   Pleas Koch, NP  sacubitril-valsartan (ENTRESTO) 97-103 MG Take 0.5 tablets by mouth 2 (two) times daily. 01/18/21 01/18/22  [provider]  Semaglutide, 1 MG/DOSE, (OZEMPIC, 1 MG/DOSE,) 4 MG/3ML SOPN Inject 1 mg into the skin once a week. 11/09/20   [provider]  sildenafil (REVATIO) 20 MG tablet Take 2 to 5 tablets by mouth 1 hour prior to intercourse as needed. 11/15/20   Pleas Koch, NP  spironolactone (ALDACTONE) 25 MG tablet Take 25 mg by mouth daily. 1/2 tab bid    [provider]  traZODone (DESYREL) 100 MG tablet TAKE 2 TABLETS BY MOUTH  EVERY NIGHT AT BEDTIME 06/17/20   Pleas Koch, NP    Allergies Naproxen and Morphine and related  Family History  Problem Relation Age of Onset   Heart attack Mother 81   Hypertension Mother    Melanoma Mother    Heart attack Father 33   Hypertension Father    Hypertension Maternal Uncle    Heart disease Maternal Uncle    Heart disease Maternal Grandmother    Stroke Maternal Grandmother     Diabetes Neg Hx     Social History Social History   Tobacco Use   Smoking status: Former    Packs/day: 1.00    Years: 25.00    Pack years: 25.00    Types: Cigarettes    Quit date: 05/19/2018    Years since quitting: 3.1   Smokeless tobacco: Never  Vaping Use   Vaping Use: Never used  Substance Use Topics   Alcohol use: No    Comment: rare   Drug use: No    Review of Systems  Constitutional: Negative for fever. Eyes: Negative for visual changes. ENT: Negative for sore throat. Cardiovascular: Negative for chest pain. Respiratory: Negative for shortness of breath. Gastrointestinal: Negative for abdominal pain, vomiting and diarrhea. Genitourinary: Negative for dysuria. Musculoskeletal: Negative for back pain. Skin: Negative for rash.  Bleeding skin abrasion. Neurological: Negative for headaches, focal weakness or numbness. ____________________________________________  PHYSICAL EXAM:  VITAL SIGNS: ED Triage Vitals  Enc Vitals Group     BP 07/29/21 2204 (!) 153/81     Pulse Rate 07/29/21 2204 89     Resp 07/29/21 2204 19     Temp 07/29/21 2204 98.7 F (37.1 C)     Temp Source 07/29/21 2204 Oral     SpO2 07/29/21 2204 94 %     Weight --      Height --      Head Circumference --      Peak Flow --      Pain Score 07/29/21 2205 0     Pain Loc --      Pain Edu? --      Excl. in [place]? --     Constitutional: Alert and oriented. Well appearing and in no distress. Head: Normocephalic and atraumatic, except for a 1 cm superficial skin shave injury with mild active bleeding. Eyes: Conjunctivae are normal. Normal extraocular movements Cardiovascular: Normal rate, regular rhythm. Normal distal pulses. Respiratory: Normal respiratory effort. No wheezes/rales/rhonchi. Musculoskeletal: Nontender with normal range of motion in all extremities.  Neurologic:  Normal gait without ataxia. Normal speech and language. No gross focal neurologic deficits are appreciated. Skin:   Skin is warm, dry and intact. No  rash noted. Psychiatric: Mood and affect are normal. Patient exhibits appropriate insight and judgment. ____________________________________________    {LABS (pertinent positives/negatives)  ____________________________________________  {EKG  ____________________________________________   RADIOLOGY Official radiology report(s): No results found. ____________________________________________  PROCEDURES  LET solution topically  .Marland KitchenLaceration Repair  Date/Time: 07/29/2021 10:42 PM Performed by: Melvenia Needles, PA-C Authorized by: Melvenia Needles, PA-C   Consent:    Consent obtained:  Verbal   Consent given by:  Patient   Risks, benefits, and alternatives were discussed: yes     Risks discussed:  Pain   Alternatives discussed:  No treatment Universal protocol:    Site/side marked: yes     Patient identity confirmed:  Verbally with patient Anesthesia:    Anesthesia method:  Topical application   Topical anesthetic:  LET Laceration details:    Location:  Face   Face location:  R cheek   Length (cm):  1   Depth (mm):  1 Exploration:    Limited defect created (wound extended): no     Hemostasis achieved with:  LET   Contaminated: no   Treatment:    Area cleansed with:  Saline   Amount of cleaning:  Standard   Irrigation solution:  Sterile saline   Irrigation volume:  5 cc   Irrigation method:  Syringe   Visualized foreign bodies/material removed: no     Debridement:  None   Undermining:  None   Scar revision: no   Skin repair:    Repair method:  Tissue adhesive Repair type:    Repair type:  Simple Post-procedure details:    Dressing:  Open (no dressing)   Procedure completion:  Tolerated well, no immediate complications ____________________________________________   INITIAL IMPRESSION / ASSESSMENT AND PLAN / ED COURSE  As part of my medical decision making, I reviewed the following data within the  electronic MEDICAL RECORD NUMBER Notes from prior ED visits   Patient with ED evaluation of superficial shave laceration to the right cheek.  He presents due to continued bleeding from the superficial wound.  Patient is evaluated in ED, and that is applied which did allow for complete hemostasis.  Dermabond was applied to further seal the open wound.  Patient discharged in stable condition at this time.  He will follow with primary provider for ongoing symptoms.   Brian Williams was evaluated in Emergency Department on 07/29/2021 for the symptoms described in the history of present illness. He was evaluated in the context of the global COVID-19 pandemic, which necessitated consideration that the patient might be at risk for infection with the SARS-CoV-2 virus that causes COVID-19. Institutional protocols and algorithms that pertain to the evaluation of patients at risk for COVID-19 are in a state of rapid change based on information released by regulatory bodies including the CDC and federal and state organizations. These policies and algorithms were followed during the patient's care in the ED. ____________________________________________  FINAL CLINICAL IMPRESSION(S) / ED DIAGNOSES  Final diagnoses:  Facial laceration, initial encounter      Melvenia Needles, PA-C 07/29/21 2344    Carrie Mew, MD 07/30/21 0008

## 2021-07-31 ENCOUNTER — Telehealth: Payer: Self-pay

## 2021-07-31 NOTE — Telephone Encounter (Signed)
[facility] - Client TELEPHONE ADVICE RECORD [facility] Patient Name: Brian Williams Gender: Male DOB: 16-Aug-1961 Age: 60 Y 96 M 22 D Return Phone Number: [phone] (Primary), [phone] (Secondary) Address: City/ State/ Zip: [place]  [address] Client [facility] - Client Client Site [place] [facility] - Night Physician Alma Friendly - NP Contact Type Call Who Is Calling Patient / Member / Family / Caregiver Call Type Triage / Clinical Relationship To Patient Self Return Phone Number [phone] (Primary) Chief Complaint BLEEDING - Uncontrollable (not vaginal) Reason for Call Symptomatic / Request for [place] states they cut themselves shaving 8 hours ago and is still bleeding uncontrollably. Attempted to stop with gel. Translation No Nurse Assessment Nurse: Cruzita Lederer, RN, Richrd Sox Date/Time (Eastern Time): 07/29/2021 9:03:57 PM Confirm and document reason for call. If symptomatic, describe symptoms. ---Caller states that he cut himself shaving just below right eye and it has been bleeding for the past 8 hours. He has tried everything to get it to stop. He is on Plavix Does the patient have any new or worsening symptoms? ---Yes Will a triage be completed? ---Yes Related visit to physician within the last 2 weeks? ---Yes Does the PT have any chronic conditions? (i.e. diabetes, asthma, this includes High risk factors for pregnancy, etc.) ---Yes List chronic conditions. ---Heart failure DM Type II HTN Is this a behavioral health or substance abuse call? ---No Guidelines Guideline Title Affirmed Question Affirmed Notes Nurse Date/Time (Eastern Time) Cuts and Lacerations [1] Bleeding AND [2] won't stop after 10 minutes of direct pressure (using correct technique) Cruzita Lederer, RN, Richrd Sox 07/29/2021 9:06:35 PM Disp. Time Eilene Ghazi Time) Disposition  Final User 07/29/2021 9:01:50 PM Send to Urgent [employer] PLEASE NOTE: All timestamps contained within this report are represented as [place] Standard Time. CONFIDENTIALTY NOTICE: This fax transmission is intended only for the addressee. It contains information that is legally privileged, confidential or otherwise protected from use or disclosure. If you are not the intended recipient, you are strictly prohibited from reviewing, disclosing, copying using or disseminating any of this information or taking any action in reliance on or regarding this information. If you have received this fax in error, please notify us immediately by telephone so that we can arrange for its return to [place]. Phone: [phone], Toll-Free: [phone], Fax: [phone] Page: 2 of 2 Call Id: [id number] 07/29/2021 9:07:26 PM Go to ED Now Yes Cruzita Lederer, RN, Albertine Patricia Disagree/Comply Comply Caller Understands Yes PreDisposition Did not know what to do Care Advice Given Per Guideline GO TO ED NOW: * You need to be seen in the Emergency Department. * Leave now. Drive carefully. CARE ADVICE given per Cuts and Lacerations (Adult) guideline. * Put direct pressure on the wound for 10 minutes to stop any bleeding. Referrals [place]

## 2021-07-31 NOTE — Telephone Encounter (Signed)
Per chart review tab pt was seen at [facility] ED on 07/29/21; sending note to Gentry Fitz NP as PCP who is out of office today, Joellen CMA and Romilda Garret NP who is in office.

## 2021-07-31 NOTE — Telephone Encounter (Signed)
Noted. I reviewed ED noted they were able to get him taken care of.

## 2021-08-01 DIAGNOSIS — Z23 Encounter for immunization: Secondary | ICD-10-CM | POA: Diagnosis not present

## 2021-08-01 DIAGNOSIS — S61012A Laceration without foreign body of left thumb without damage to nail, initial encounter: Secondary | ICD-10-CM | POA: Diagnosis not present

## 2021-08-01 NOTE — Telephone Encounter (Signed)
Noted, ED notes reviewed.

## 2021-08-04 ENCOUNTER — Ambulatory Visit (INDEPENDENT_AMBULATORY_CARE_PROVIDER_SITE_OTHER): Payer: Medicare Other | Admitting: Pharmacist

## 2021-08-04 ENCOUNTER — Other Ambulatory Visit: Payer: Self-pay

## 2021-08-04 VITALS — BP 124/68 | HR 90

## 2021-08-04 DIAGNOSIS — E1142 Type 2 diabetes mellitus with diabetic polyneuropathy: Secondary | ICD-10-CM

## 2021-08-04 DIAGNOSIS — I25118 Atherosclerotic heart disease of native coronary artery with other forms of angina pectoris: Secondary | ICD-10-CM

## 2021-08-04 DIAGNOSIS — I10 Essential (primary) hypertension: Secondary | ICD-10-CM

## 2021-08-04 DIAGNOSIS — E118 Type 2 diabetes mellitus with unspecified complications: Secondary | ICD-10-CM

## 2021-08-04 DIAGNOSIS — I5022 Chronic systolic (congestive) heart failure: Secondary | ICD-10-CM

## 2021-08-04 DIAGNOSIS — E781 Pure hyperglyceridemia: Secondary | ICD-10-CM

## 2021-08-04 NOTE — Progress Notes (Signed)
Chronic Care Management Pharmacy Note  08/14/2021 Name:  Brian Williams MRN:  [id number] DOB:  04/21/1961  Summary: -Home BG, BP improved since last visit -Pt is due for repeat lipid panel (last 07/2020). He is overdue for rosuvastatin refill but claims he has another 2-3 week supply remaining. He has CPE scheduled 09/20/21.  Recommendations/Changes made from today's visit: -Coordinate refill for rosuvastatin 20 mg (will run out before appt with PCP)  Plan: -[place] will call patient in 1 month for statin adherence -Pharmacist follow up televisit scheduled for 3 months   Subjective: Brian Williams is an 60 y.o. year old male who is a primary patient of Pleas Koch, NP.  The CCM team was consulted for assistance with disease management and care coordination needs.    Engaged with patient by telephone for follow up visit in response to provider referral for pharmacy case management and/or care coordination services.   Consent to Services:  The patient was given information about Chronic Care Management services, agreed to services, and gave verbal consent prior to initiation of services.  Please see initial visit note for detailed documentation.   Patient Care Team: Pleas Koch, NP as PCP - General (Internal Medicine) Rockey Situ Kathlene November, MD as PCP - Cardiology (Cardiology) Minna Merritts, MD as Consulting Physician (Cardiology) Radene Knee Ladell Pier, MD as Consulting Physician (Cardiology) Jamelle Rushing, MD as Referring Physician (Dermatology) Charlton Haws, [facility] as Pharmacist (Pharmacist) Bryson Ha, OD (Optometry)  Patient lives at home with his wife and 2 adult children. He is from [place] area, moved to [place] after he was put on disability and cost of living in [place] became too high. 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   Recent office visits: 12/06/20 Alma Friendly, NP Internal Medicine Reason:Diarrhea - EMS  took patient to hospital Medication changes: decrease the coreg down to 3.125 mg twice a day,Consider lasix every other day until stomach is better and fluid intake better  Recent consult visits: 04/27/21 Dr Morton Stall [facility] cardiology): increase Delene Loll to 1/2 tab BID; restart spironolactone 12.5 mg daily if BP ok; pt is off SGLT2 d/t recurrent yeast infection  04/20/21 Dr Celine Ahr (Gen surgery): f/u appendicitis. Referred to [facility] for surgery.  03/21/21 Dr Rockey Situ [facility] cardiology): f/u; d/c carvedilol, Welchol, Jardiance, Novolog d/t pt not taking.  02/24/21 Dr Selinda Flavin [facility] endocrine): decrease insulin by 10% due to hypoglycemia; current TDD ~90 units, 50-60% decrease from 1 year prior  02/07/21 Dr Roland Rack (ortho): f/u bilateral knee pain - received bilateral steroid injections  12/28/20 PA Woodard ([facility]): eval dysentery; diarrhea resolved.  Hospital visits: Medication Reconciliation was completed by comparing discharge summary, patient's EMR and Pharmacy list, and upon discussion with patient.  Admitted to the hospital on 06/05/2021 due to acute appendicitis. Patient had Laparoscopic appendectomy.  Discharge date was 06/05/2021. Discharged from [place]?Medications Started at [facility] Discharge:?? -started methocarbamoL 500 MG tablet; Commonly known as: ROBAXIN; Take 1 tablet  (500 mg total) by mouth every eight (8) hours for 7 days.  -started oxyCODONE 5 MG immediate release tablet; for up to 5 days.   Medications Discontinued at Hospital Discharge: -Stopped acetaminophen 500 MG tablet   Medications that remain the same after Hospital Discharge:??  -All other medications will remain the same.    Admitted to the hospital on 04/03/21 due to Acute appendicitis. Discharge date was 04/06/21. Discharged from [facility].   -underwent conservative  mgmt due to cardiac issues  New?Medications Started at [facility] Discharge:?? -started Augmentin 875-125 mg BID x 12  days  Medications that remain the same after Hospital Discharge:??  -All other medications will remain the same.    Admitted to the hospital on 12/06/20 due to Dehydration/AKI s/o diarhea. Discharge date was 12/08/20. Discharged from [facility].    New?Medications Started at [facility] Discharge:?? -started Rifaximin TID x 14 days -plan to start Welchol  Medication Changes at Hospital Discharge: -Changed carvedilol to 12.5 mg BID  Medications Discontinued at Hospital Discharge: -Stopped Entresto, spironolactone (on hold in s/o AKI)  Medications that remain the same after Hospital Discharge:??  -All other medications will remain the same.    Objective:  Lab Results  Component Value Date   CREATININE 0.77 07/13/2021   BUN 8 07/13/2021   GFR 97.36 07/13/2021   GFRNONAA >60 04/06/2021   GFRAA 111 04/05/2020   NA 137 07/13/2021   K 4.1 07/13/2021   CALCIUM 9.6 07/13/2021   CO2 24 07/13/2021   GLUCOSE 102 (H) 07/13/2021    Lab Results  Component Value Date/Time   HGBA1C 6.5 02/24/2021 12:00 AM   HGBA1C 6.8 (H) 12/06/2020 09:20 PM   HGBA1C 8.6 (H) 07/22/2020 08:59 AM   GFR 97.36 07/13/2021 01:10 PM   GFR 81.43 07/22/2020 08:59 AM    Last diabetic Eye exam:  Lab Results  Component Value Date/Time   HMDIABEYEEXA Retinopathy (A) 05/12/2021 12:00 AM    Last diabetic Foot exam: No results found for: HMDIABFOOTEX   Lab Results  Component Value Date   CHOL 125 07/22/2020   HDL 32.10 (L) 07/22/2020   LDLCALC 65 07/22/2020   LDLDIRECT 42.0 07/21/2018   TRIG 139.0 07/22/2020   CHOLHDL 4 07/22/2020    Hepatic Function Latest Ref Rng & Units 07/13/2021 04/04/2021 04/03/2021  Total Protein 6.0 - 8.3 g/dL 7.3 6.3(L) 7.9  Albumin 3.5 - 5.2 g/dL 4.3 3.7 4.7  AST 0 - 37 U/L '26 16 19  ' ALT 0 - 53 U/L '28 18 21  ' Alk Phosphatase 39 - 117 U/L 44 30(L) 42  Total Bilirubin 0.2 - 1.2 mg/dL 0.3 0.9 0.8  Bilirubin, Direct 0.00 - 0.40 mg/dL - - -    Lab Results  Component Value  Date/Time   TSH 3.804 12/07/2020 05:58 AM   TSH 3.210 06/03/2015 11:54 AM    CBC Latest Ref Rng & Units 07/25/2021 07/13/2021 04/06/2021  WBC 4.0 - 10.5 K/uL 7.8 5.9 4.1  Hemoglobin 13.0 - 17.0 g/dL 11.7(L) 11.0(L) 9.9(L)  Hematocrit 39.0 - 52.0 % 33.5(L) 32.2(L) 28.9(L)  Platelets 150.0 - 400.0 K/uL 158.0 Repeated and verified X2. 161.0 140(L)   Iron/TIBC/Ferritin/ %Sat    Component Value Date/Time   IRON 123 07/25/2021 1138   TIBC 484.4 (H) 07/25/2021 1138   FERRITIN 138.7 07/25/2021 1138   IRONPCTSAT 25.4 07/25/2021 1138    No results found for: VD25OH  Clinical ASCVD: Yes  The ASCVD Risk score (Arnett DK, et al., 2019) failed to calculate for the following reasons:   The patient has a prior MI or stroke diagnosis    Depression screen [facility] 2/9 07/21/2021 05/25/2021 02/17/2020  Decreased Interest 0 0 1  Down, Depressed, Hopeless 0 0 1  PHQ - 2 Score 0 0 2  Altered sleeping - - 0  Tired, decreased energy - - 3  Change in appetite - - 3  Feeling bad or failure about yourself  - - 3  Trouble concentrating - - 1  Moving slowly or fidgety/restless - - 3  Suicidal thoughts - - 1  PHQ-9 Score - - 16  Difficult doing work/chores - - Somewhat difficult  Some recent data might be hidden    GAD 7 : Generalized Anxiety Score 07/23/2019 07/08/2017  Nervous, Anxious, on Edge 2 3  Control/stop worrying 3 3  Worry too much - different things 3 3  Trouble relaxing 1 3  Restless 1 3  Easily annoyed or irritable 1 1  Afraid - awful might happen 1 3  Total GAD 7 Score 12 19  Anxiety Difficulty Somewhat difficult Somewhat difficult    Social History   Tobacco Use  Smoking Status Former   Packs/day: 1.00   Years: 25.00   Pack years: 25.00   Types: Cigarettes   Quit date: 05/19/2018   Years since quitting: 3.2  Smokeless Tobacco Never   BP Readings from Last 3 Encounters:  08/04/21 124/68  07/29/21 (!) 153/81  07/13/21 132/67   Pulse Readings from Last 3 Encounters:  08/04/21  90  07/29/21 89  07/13/21 73   Wt Readings from Last 3 Encounters:  07/21/21 270 lb (122.5 kg)  07/13/21 270 lb (122.5 kg)  06/22/21 265 lb (120.2 kg)   BMI Readings from Last 3 Encounters:  07/21/21 36.62 kg/m  07/13/21 36.62 kg/m  06/22/21 35.94 kg/m    Assessment/Interventions: Review of patient past medical history, allergies, medications, health status, including review of consultants reports, laboratory and other test data, was performed as part of comprehensive evaluation and provision of chronic care management services.   SDOH:  (Social Determinants of Health) assessments and interventions performed: Yes  SDOH Screenings   Alcohol Screen: Low Risk    Last Alcohol Screening Score (AUDIT): 0  Depression (PHQ2-9): Low Risk    PHQ-2 Score: 0  Financial Resource Strain: Low Risk    Difficulty of Paying Living Expenses: Not hard at all  Food Insecurity: No Food Insecurity   Worried About Charity fundraiser in the Last Year: Never true   Ran Out of Food in the Last Year: Never true  Housing: Low Risk    Last Housing Risk Score: 0  Physical Activity: Inactive   Days of Exercise per Week: 0 days   Minutes of Exercise per Session: 0 min  Social Connections: Socially Isolated   Frequency of Communication with Friends and Family: Never   Frequency of Social Gatherings with Friends and Family: Never   Attends Religious Services: Never   Marine scientist or Organizations: No   Attends Music therapist: Never   Marital Status: Married  Stress: No Stress Concern Present   Feeling of Stress : Not at all  Tobacco Use: Medium Risk   Smoking Tobacco Use: Former   Smokeless Tobacco Use: Never   Passive Exposure: Not on Pensions consultant Needs: No Transportation Needs   Lack of Transportation (Medical): No   Lack of Transportation (Non-Medical): No    CCM Care Plan  Allergies  Allergen Reactions   Naproxen Other (See Comments), Anxiety and Rash     Causes hyperactivity causes hyperactivity   Morphine And Related Nausea And Vomiting    Morphine only    Medications Reviewed Today     Reviewed by Charlton Haws, [facility] (Pharmacist) on 08/04/21 at [place] List Status: <None>   Medication Order Taking? Sig Documenting Provider Last Dose Status Informant  aspirin EC 81 MG tablet [id number] Yes Take 81 mg by mouth  daily.  Minna Merritts, MD Taking Active Self  clopidogrel (PLAVIX) 75 MG tablet [id number] Yes TAKE ONE TABLET BY MOUTH DAILY Minna Merritts, MD Taking Active Self  escitalopram (LEXAPRO) 20 MG tablet [id number] Yes TAKE 1 TABLET BY MOUTH  DAILY FOR ANXIETY AND  DEPRESSION Tonia Ghent, MD Taking Active   ezetimibe (ZETIA) 10 MG tablet [id number] Yes TAKE 1 TABLET BY MOUTH  DAILY Gollan, Kathlene November, MD Taking Active Self  furosemide (LASIX) 40 MG tablet [id number] Yes Take 80 mg in the am & 40 mg in the pm. [provider] Taking Active Self           Med Note Charlton Haws   Fri Apr 28, 2021  1:24 PM)    gabapentin (NEURONTIN) 600 MG tablet [id number] Yes TAKE TWO TABLETS BY MOUTH TWICE A DAY FOR PAIN Pleas Koch, NP Taking Active   gemfibrozil (LOPID) 600 MG tablet [id number] Yes Take 1 tablet (600 mg total) by mouth daily. For cholesterol. Pleas Koch, NP Taking Active   insulin regular human CONCENTRATED (HUMULIN R) 500 UNIT/ML injection [id number] Yes Inject 0-25 Units into the skin daily at 12 noon. [provider] Taking Active Self           Med Note ([facility], BRANDY L   Tue May 23, 2021 11:29 AM)    methocarbamol (ROBAXIN) 750 MG tablet [id number] Yes Take 1 tablet (750 mg total) by mouth every 8 (eight) hours as needed for muscle spasms. Gillis Santa, MD Taking Active   metoprolol succinate (TOPROL-XL) 50 MG 24 hr tablet [id number] Yes Take 50 mg by mouth daily. [provider] Taking Active Self  nitroGLYCERIN (NITROSTAT) 0.4 MG SL tablet [id number] Yes Place  1 tablet (0.4 mg total) under the tongue every 5 (five) minutes as needed for chest pain. Pleas Koch, NP Taking Active   omega-3 acid ethyl esters (LOVAZA) 1 g capsule [id number] Yes Take 2 capsules by mouth 2 (two) times daily. [provider] Taking Active Self  rosuvastatin (CRESTOR) 20 MG tablet [id number] Yes TAKE 1 TABLET BY MOUTH AT  BEDTIME FOR CHOLESTEROL Pleas Koch, NP Taking Active Self  sacubitril-valsartan (ENTRESTO) 97-103 MG [id number] Yes Take 0.5 tablets by mouth 2 (two) times daily. [provider] Taking Active            Med Note Charlton Haws   Fri Apr 28, 2021  1:25 PM)    Semaglutide, 2 MG/DOSE, (OZEMPIC, 2 MG/DOSE,) 8 MG/3ML SOPN [id number] Yes Inject 2 mg into the skin once a week. [provider]  Active   sildenafil (REVATIO) 20 MG tablet [id number] Yes Take 2 to 5 tablets by mouth 1 hour prior to intercourse as needed. Pleas Koch, NP Taking Active Self  spironolactone (ALDACTONE) 50 MG tablet [id number] Yes Take 25 mg by mouth daily. 1/2 tab bid [provider] Taking Active   traZODone (DESYREL) 100 MG tablet [id number] Yes TAKE 2 TABLETS BY MOUTH  EVERY NIGHT AT BEDTIME Pleas Koch, NP Taking Active Self            Patient Active Problem List   Diagnosis Date Noted   Viral illness 07/13/2021   Lumbar degenerative disc disease 05/11/2021   Lumbar facet arthropathy 05/11/2021   Lumbar post-laminectomy syndrome 05/11/2021   Chronic pain syndrome 05/11/2021   Chronic SI joint pain 05/11/2021   Low back pain 05/10/2021   Acute appendicitis 04/03/2021   Depression 04/03/2021  Diarrhea 12/06/2020   Hypotension 12/06/2020   Tingling 12/01/2020   Vision changes 12/01/2020   Dizziness 11/23/2020   Sinusitis, acute 08/08/2020   Diabetic neuropathy associated with type 2 diabetes mellitus ([place]) 08/05/2020   Falls, initial encounter 08/05/2020   Leg pain, bilateral 08/05/2020   Sensory ataxia  08/05/2020   Chronic knee pain 07/25/2020   Myalgia 01/28/2019   Major depressive disorder 07/08/2017   Erectile dysfunction 07/08/2017   Hx of CABG 07/26/2016   Coronary artery disease 05/24/2016   Atherosclerosis of native coronary artery of native heart with stable angina pectoris ([place])    History of coronary artery stent placement    Unstable angina ([place]) 05/04/2016   Obesity (BMI 30-39.9) 04/06/2016   OSA on CPAP 01/06/2016   Diabetes, polyneuropathy ([place]) 01/06/2016   Hidradenitis suppurativa 10/06/2015   Chronic back pain 05/02/2015   Essential hypertension 05/02/2015   Hyperlipidemia 05/02/2015   Generalized anxiety disorder 05/02/2015   ICD (implantable cardioverter-defibrillator) in place 04/20/2015   Congestive dilated cardiomyopathy ([place]) 04/20/2015   Diabetes mellitus type 2, uncontrolled, with complications 64/68/0321   Chronic systolic heart failure ([place]) 09/30/2014    Immunization History  Administered Date(s) Administered   Influenza Inj Mdck Quad Pf 06/25/2018, 06/05/2019   Influenza Split 07/22/2015   Influenza,inj,Quad PF,6+ Mos 06/13/2016, 06/24/2020   Influenza-Unspecified 06/25/2018, 06/06/2019   Moderna Sars-Covid-2 Vaccination 10/31/2019, 11/21/2019   Pneumococcal Polysaccharide-23 07/21/2018   Td 04/17/2018   Tdap 02/22/2018    Conditions to be addressed/monitored:  Hypertension, Hyperlipidemia, Diabetes, Heart Failure, Coronary Artery Disease, Depression, and Anxiety  Care Plan : [place] plan  Updates made by Charlton Haws, [place] since 08/14/2021 12:00 AM     Problem: Hypertension, Hyperlipidemia, Diabetes, Heart Failure, Coronary Artery Disease, Depression, and Anxiety   Priority: High     Long-Range Goal: Disease management   Start Date: 04/28/2021  Expected End Date: 04/28/2022  This Visit's Progress: On track  Recent Progress: On track  Priority: High  Note:   Current Barriers:  Unable to independently monitor  therapeutic efficacy Unable to maintain control of blood sugars  Pharmacist Clinical Goal(s):  Patient will achieve adherence to monitoring guidelines and medication adherence to achieve therapeutic efficacy maintain control of blood sugars as evidenced by Dexcom G6  through collaboration with PharmD and provider.   Interventions: 1:1 collaboration with Pleas Koch, NP regarding development and update of comprehensive plan of care as evidenced by provider attestation and co-signature Inter-disciplinary care team collaboration (see longitudinal plan of care) Comprehensive medication review performed; medication list updated in electronic medical record  Hyperlipidemia / CAD (LDL goal < 70) -Controlled - LDL is at goal; annual lipid panel is due; Trig are elevated but < 500 most recently, previously in [place]. Pt endorses adherence with medications as prescribed He reports Lovaza (generic) is expensive >$100 for 3 month supply -Pt is aware of drug interaction with rosuvastatin and gemfibrozil - pt does endorse hx of leg pain -Pt cut himself shaving, had to ER when it wouldn't stop bleeding 11/12  -Current treatment: Ezetimibe 10 mg daily Gemfibrozil 600 mg daily Rosuvastatin 20 mg daily Clopidogrel 75 mg daily Nitroglycerin 0.4 mg SL prn Lovaza 1 g - 2 cap BID OTC Fish oil 1000 mg daily Aspirin 81 mg daily -Current exercise habits: water aerobics few times a week -Educated on Cholesterol goals; Benefits of statin for ASCVD risk reduction; -Discussed gemfibrozil may increase risk for myopathy with rosuvastatin; pt currently tolerating reasonably well, he has  significant hypertriglyceridemia and requires multiple therapies for this -due for annual lipid panel, next CPE w/ PCP is 09/20/21 -Recommended to continue current medication;  Diabetes (A1c goal <7%) -Improving - recent A1c at goal; he has follows with endocrinologist in [place];  -Pt is also interested in having Ozempic pt  assistance come through PCP office instead of [facility] endocrine -Current home glucose readings (via [place]) Random: 11/18 @ 1:07pm - 129 Avg: 167; GMI 7.3% -Current medications: Humulin R U-500 via insulin pump Ozempic 2 mg weekly Dexcom G6 -Medications previously tried: Lovie Macadamia (yeast infections)  -Educated on A1c and blood sugar goals; -Coordinate with PCP for Ozempic pt assistance renewal for 2023 -Recommend to continue current medication  Heart Failure / HTN (Goal: BP < 130/80; prevent exacerbations) -Controlled - pt reports checking BP 2-3 times a week; he does have history of low BP in s/o dehydration/diarrhea; pt denies recent swelling/SOB -Current home BP readings: 124/68, HR 90  -Last ejection fraction: 25%  -HF type: Systolic -NYHA Class: III (marked limitation of activity) -Current treatment: Furosemide 40 mg - 2 tab AM, 1 tab PM Metoprolol succinate 50 mg daily Entresto 97-103 mg - 1/2 tab BID Spironolocatone 50 mg mg daily - 1/2 tab daily -Educated on Benefits of medications for managing symptoms and prolonging life -Recommended to continue current medication  Depression/Anxiety (Goal: manage symptoms) -Controlled - pt reports mood is well controlled, he is sleeping well -Current treatment: Escitalopram 20 mg daily Trazodone 100 mg - 2 tab HS -Medications previously tried/failed: duloxetine, sertraline, -PHQ9: 16 (02/2020) -GAD7: 12 (07/2019) -Connected with PCP for mental health support -Educated on Benefits of medication for symptom control -Recommended to continue current medication  Neuropathy (Goal: manage symptoms) -Controlled - pt reports gabapentin was started for leg pain; he does not endorse excess drowsiness/sedation -Current treatment  Gabapentin 600 mg - 2 tab BID -Recommended to continue current medication  Health Maintenance -Vaccine gaps: Covid booster, Shingrix -Pt reports he has side effects with first covid booster (chills); he  reports he had both doses of Shingrix at [facility]  Patient Goals/Self-Care Activities Patient will:  - take medications as prescribed -focus on medication adherence by pill box -check glucose via Dexcom G6, document, and provide at future appointments -check blood pressure daily, document, and provide at future appointments -collaborate with provider on medication access solutions -target a minimum of 150 minutes of moderate intensity exercise weekly -Call Endocrine office for insulin pump adjustments      Medication Assistance:  [place] - via [employer] endocrine [employer] mails to pt) Humulin R U-500 - [place] - via [employer] endocrine [employer] - via cardiology  Compliance/Adherence/Medication fill history: Care Gaps: Foot exam (due 02/03/21)  Star-Rating Drugs: Rosuvastatin - LF 04/20/21 x 90 ds. [place] 53%. Pt in need of refill, requested from PCP.  Patient's preferred pharmacy is:  [facility] ([place]) [place], [place] [address] Phone: [phone] Fax: [phone]  [id number] [place], [address] [facility] AT [address] [place] [address] Phone: [phone] Fax: [phone]  Uses pill box? Yes Pt endorses 100% compliance  We discussed: Current pharmacy is preferred with insurance plan and patient is satisfied with pharmacy services Patient decided to: Continue current medication management strategy  Care Plan and Follow Up Patient Decision:  Patient agrees to Care Plan and Follow-up.  Plan:  Telephone follow up appointment with care management team member scheduled for:  3 months  [name], PharmD, [facility] Clinical Pharmacist [facility] [phone]

## 2021-08-07 DIAGNOSIS — M47816 Spondylosis without myelopathy or radiculopathy, lumbar region: Secondary | ICD-10-CM | POA: Diagnosis not present

## 2021-08-07 DIAGNOSIS — M47818 Spondylosis without myelopathy or radiculopathy, sacral and sacrococcygeal region: Secondary | ICD-10-CM | POA: Diagnosis not present

## 2021-08-07 DIAGNOSIS — M25552 Pain in left hip: Secondary | ICD-10-CM | POA: Diagnosis not present

## 2021-08-07 DIAGNOSIS — M1612 Unilateral primary osteoarthritis, left hip: Secondary | ICD-10-CM | POA: Diagnosis not present

## 2021-08-08 ENCOUNTER — Ambulatory Visit (INDEPENDENT_AMBULATORY_CARE_PROVIDER_SITE_OTHER): Payer: Medicare Other

## 2021-08-08 ENCOUNTER — Telehealth: Payer: Self-pay

## 2021-08-08 DIAGNOSIS — I5022 Chronic systolic (congestive) heart failure: Secondary | ICD-10-CM

## 2021-08-08 DIAGNOSIS — E7800 Pure hypercholesterolemia, unspecified: Secondary | ICD-10-CM

## 2021-08-08 LAB — CUP PACEART REMOTE DEVICE CHECK
Battery Remaining Longevity: 41 mo
Battery Remaining Percentage: 44 %
Battery Voltage: 2.9 V
Brady Statistic AP VP Percent: 1 %
Brady Statistic AP VS Percent: 1.3 %
Brady Statistic AS VP Percent: 31 %
Brady Statistic AS VS Percent: 66 %
Brady Statistic RA Percent Paced: 1 %
Brady Statistic RV Percent Paced: 32 %
Date Time Interrogation Session: 20221122070249
HighPow Impedance: 43 Ohm
HighPow Impedance: 43 Ohm
Implantable Lead Implant Date: 20160929
Implantable Lead Implant Date: 20160929
Implantable Lead Location: 753859
Implantable Lead Location: 753860
Implantable Lead Model: 7071
Implantable Pulse Generator Implant Date: 20160929
Lead Channel Impedance Value: 350 Ohm
Lead Channel Impedance Value: 410 Ohm
Lead Channel Pacing Threshold Amplitude: 0.75 V
Lead Channel Pacing Threshold Amplitude: 0.75 V
Lead Channel Pacing Threshold Pulse Width: 0.4 ms
Lead Channel Pacing Threshold Pulse Width: 0.5 ms
Lead Channel Sensing Intrinsic Amplitude: 11.7 mV
Lead Channel Sensing Intrinsic Amplitude: 3.7 mV
Lead Channel Setting Pacing Amplitude: 2 V
Lead Channel Setting Pacing Amplitude: 2.5 V
Lead Channel Setting Pacing Pulse Width: 0.5 ms
Lead Channel Setting Sensing Sensitivity: 0.5 mV
Pulse Gen Serial Number: 7306375

## 2021-08-08 NOTE — Progress Notes (Signed)
Reached out to the patient and asked about his Rosuvastatin 20mg  take 1 tablet bedtime. The patient reports he has 2-3 weeks remaining on hand and he takes daily without missing any doses. [facility] mail order pharmacy states his last fill was 04/20/21 for 90ds and they will need a new rx for the next refill  Fax [place], CPP notified  [name], [place] Assistant [phone]  Total time spent for month CPA: 10 min.

## 2021-08-09 ENCOUNTER — Other Ambulatory Visit: Payer: Self-pay | Admitting: Primary Care

## 2021-08-09 DIAGNOSIS — G4700 Insomnia, unspecified: Secondary | ICD-10-CM

## 2021-08-11 ENCOUNTER — Encounter: Payer: Self-pay | Admitting: Student in an Organized Health Care Education/Training Program

## 2021-08-14 NOTE — Patient Instructions (Signed)
Visit Information  Phone number for Pharmacist: [phone]   Goals Addressed             This Visit's Progress    Manage My Medicine       Timeframe:  Long-Range Goal Priority:  Medium Start Date:            04/28/21                 Expected End Date:    04/28/22                   Follow Up Date Feb 2023   - call for medicine refill 2 or 3 days before it runs out - call if I am sick and can't take my medicine - keep a list of all the medicines I take; vitamins and herbals too - use a pillbox to sort medicine  -Call Endocrine office for insulin pump adjustments   Why is this important?   These steps will help you keep on track with your medicines.   Notes:         Care Plan : [place] plan  Updates made by Charlton Haws, RPH since 08/14/2021 12:00 AM     Problem: Hypertension, Hyperlipidemia, Diabetes, Heart Failure, Coronary Artery Disease, Depression, and Anxiety   Priority: High     Long-Range Goal: Disease management   Start Date: 04/28/2021  Expected End Date: 04/28/2022  This Visit's Progress: On track  Recent Progress: On track  Priority: High  Note:   Current Barriers:  Unable to independently monitor therapeutic efficacy Unable to maintain control of blood sugars  Pharmacist Clinical Goal(s):  Patient will achieve adherence to monitoring guidelines and medication adherence to achieve therapeutic efficacy maintain control of blood sugars as evidenced by Dexcom G6  through collaboration with PharmD and provider.   Interventions: 1:1 collaboration with Pleas Koch, NP regarding development and update of comprehensive plan of care as evidenced by provider attestation and co-signature Inter-disciplinary care team collaboration (see longitudinal plan of care) Comprehensive medication review performed; medication list updated in electronic medical record  Hyperlipidemia / CAD (LDL goal < 70) -Controlled - LDL is at goal; annual  lipid panel is due; Trig are elevated but < 500 most recently, previously in [place]. Pt endorses adherence with medications as prescribed He reports Lovaza (generic) is expensive >$100 for 3 month supply -Pt is aware of drug interaction with rosuvastatin and gemfibrozil - pt does endorse hx of leg pain -Pt cut himself shaving, had to ER when it wouldn't stop bleeding 11/12  -Current treatment: Ezetimibe 10 mg daily Gemfibrozil 600 mg daily Rosuvastatin 20 mg daily Clopidogrel 75 mg daily Nitroglycerin 0.4 mg SL prn Lovaza 1 g - 2 cap BID OTC Fish oil 1000 mg daily Aspirin 81 mg daily -Current exercise habits: water aerobics few times a week -Educated on Cholesterol goals; Benefits of statin for ASCVD risk reduction; -Discussed gemfibrozil may increase risk for myopathy with rosuvastatin; pt currently tolerating reasonably well, he has significant hypertriglyceridemia and requires multiple therapies for this -due for annual lipid panel, next CPE w/ PCP is 09/20/21 -Recommended to continue current medication;  Diabetes (A1c goal <7%) -Improving - recent A1c at goal; he has follows with endocrinologist in [place];  -Pt is also interested in having Ozempic pt assistance come through PCP office instead of [facility] endocrine -Current home glucose readings (via [place]) Random: 11/18 @ 1:07pm - 129 Avg: 167; GMI 7.3% -Current medications: Humulin  R U-500 via insulin pump Ozempic 2 mg weekly Dexcom G6 -Medications previously tried: Lovie Macadamia (yeast infections)  -Educated on A1c and blood sugar goals; -Coordinate with PCP for Ozempic pt assistance renewal for 2023 -Recommend to continue current medication  Heart Failure / HTN (Goal: BP < 130/80; prevent exacerbations) -Controlled - pt reports checking BP 2-3 times a week; he does have history of low BP in s/o dehydration/diarrhea; pt denies recent swelling/SOB -Current home BP readings: 124/68, HR 90  -Last ejection fraction: 25%  -HF type:  Systolic -NYHA Class: III (marked limitation of activity) -Current treatment: Furosemide 40 mg - 2 tab AM, 1 tab PM Metoprolol succinate 50 mg daily Entresto 97-103 mg - 1/2 tab BID Spironolocatone 50 mg mg daily - 1/2 tab daily -Educated on Benefits of medications for managing symptoms and prolonging life -Recommended to continue current medication  Depression/Anxiety (Goal: manage symptoms) -Controlled - pt reports mood is well controlled, he is sleeping well -Current treatment: Escitalopram 20 mg daily Trazodone 100 mg - 2 tab HS -Medications previously tried/failed: duloxetine, sertraline, -PHQ9: 16 (02/2020) -GAD7: 12 (07/2019) -Connected with PCP for mental health support -Educated on Benefits of medication for symptom control -Recommended to continue current medication  Neuropathy (Goal: manage symptoms) -Controlled - pt reports gabapentin was started for leg pain; he does not endorse excess drowsiness/sedation -Current treatment  Gabapentin 600 mg - 2 tab BID -Recommended to continue current medication  Health Maintenance -Vaccine gaps: Covid booster, Shingrix -Pt reports he has side effects with first covid booster (chills); he reports he had both doses of Shingrix at [facility]  Patient Goals/Self-Care Activities Patient will:  - take medications as prescribed -focus on medication adherence by pill box -check glucose via Dexcom G6, document, and provide at future appointments -check blood pressure daily, document, and provide at future appointments -collaborate with provider on medication access solutions -target a minimum of 150 minutes of moderate intensity exercise weekly -Call Endocrine office for insulin pump adjustments      Patient verbalizes understanding of instructions provided today and agrees to view in [place].  Telephone follow up appointment with pharmacy team member scheduled for: 3 months  [name], PharmD, [facility] Clinical Pharmacist [place]  Primary Care at [facility] [phone]

## 2021-08-15 ENCOUNTER — Telehealth: Payer: Self-pay | Admitting: *Deleted

## 2021-08-15 DIAGNOSIS — I2510 Atherosclerotic heart disease of native coronary artery without angina pectoris: Secondary | ICD-10-CM | POA: Diagnosis not present

## 2021-08-15 DIAGNOSIS — E668 Other obesity: Secondary | ICD-10-CM | POA: Diagnosis not present

## 2021-08-15 DIAGNOSIS — E118 Type 2 diabetes mellitus with unspecified complications: Secondary | ICD-10-CM | POA: Diagnosis not present

## 2021-08-15 MED ORDER — ROSUVASTATIN CALCIUM 20 MG PO TABS: ORAL_TABLET | ORAL | 0 refills | Status: DC

## 2021-08-15 NOTE — Telephone Encounter (Signed)
Called patient to convey info that was given by BL.  States patient may take Gabapentin 1200 TID, continue Robaxin 750 mg and he may add alpha lipoic acid 500 - 1000 mg qhs.  If this does not help his leg pain he can call us back and we will get him back on the schedule.  Patient verbalizes u/o information.

## 2021-08-15 NOTE — Addendum Note (Signed)
Addended by: Pleas Koch on: 08/15/2021 07:46 AM   Modules accepted: Orders

## 2021-08-15 NOTE — Telephone Encounter (Signed)
Noted. Refill(s) sent to pharmacy.  

## 2021-08-16 ENCOUNTER — Telehealth (HOSPITAL_BASED_OUTPATIENT_CLINIC_OR_DEPARTMENT_OTHER): Payer: Medicare Other | Admitting: Student in an Organized Health Care Education/Training Program

## 2021-08-16 DIAGNOSIS — E1142 Type 2 diabetes mellitus with diabetic polyneuropathy: Secondary | ICD-10-CM

## 2021-08-16 DIAGNOSIS — G4733 Obstructive sleep apnea (adult) (pediatric): Secondary | ICD-10-CM | POA: Diagnosis not present

## 2021-08-16 DIAGNOSIS — E1159 Type 2 diabetes mellitus with other circulatory complications: Secondary | ICD-10-CM

## 2021-08-16 DIAGNOSIS — G4730 Sleep apnea, unspecified: Secondary | ICD-10-CM | POA: Diagnosis not present

## 2021-08-16 DIAGNOSIS — I110 Hypertensive heart disease with heart failure: Secondary | ICD-10-CM

## 2021-08-16 DIAGNOSIS — Z794 Long term (current) use of insulin: Secondary | ICD-10-CM | POA: Diagnosis not present

## 2021-08-16 DIAGNOSIS — I5022 Chronic systolic (congestive) heart failure: Secondary | ICD-10-CM | POA: Diagnosis not present

## 2021-08-16 DIAGNOSIS — E781 Pure hyperglyceridemia: Secondary | ICD-10-CM | POA: Diagnosis not present

## 2021-08-16 DIAGNOSIS — M961 Postlaminectomy syndrome, not elsewhere classified: Secondary | ICD-10-CM

## 2021-08-16 DIAGNOSIS — E118 Type 2 diabetes mellitus with unspecified complications: Secondary | ICD-10-CM | POA: Diagnosis not present

## 2021-08-16 DIAGNOSIS — I10 Essential (primary) hypertension: Secondary | ICD-10-CM | POA: Diagnosis not present

## 2021-08-16 DIAGNOSIS — I25118 Atherosclerotic heart disease of native coronary artery with other forms of angina pectoris: Secondary | ICD-10-CM

## 2021-08-16 DIAGNOSIS — R0681 Apnea, not elsewhere classified: Secondary | ICD-10-CM | POA: Diagnosis not present

## 2021-08-17 DIAGNOSIS — Z794 Long term (current) use of insulin: Secondary | ICD-10-CM | POA: Diagnosis not present

## 2021-08-17 DIAGNOSIS — G4730 Sleep apnea, unspecified: Secondary | ICD-10-CM | POA: Diagnosis not present

## 2021-08-17 DIAGNOSIS — E118 Type 2 diabetes mellitus with unspecified complications: Secondary | ICD-10-CM | POA: Diagnosis not present

## 2021-08-17 NOTE — Progress Notes (Signed)
No appt 

## 2021-08-17 NOTE — Progress Notes (Signed)
Remote ICD transmission.   

## 2021-08-21 ENCOUNTER — Encounter: Payer: Self-pay | Admitting: Student in an Organized Health Care Education/Training Program

## 2021-08-23 ENCOUNTER — Other Ambulatory Visit: Payer: Self-pay

## 2021-08-23 ENCOUNTER — Telehealth: Payer: Self-pay

## 2021-08-23 ENCOUNTER — Telehealth: Payer: Self-pay | Admitting: Gastroenterology

## 2021-08-23 DIAGNOSIS — Z86010 Personal history of colonic polyps: Secondary | ICD-10-CM

## 2021-08-23 MED ORDER — PEG 3350-KCL-NA BICARB-NACL 420 G PO SOLR: ORAL | 0 refills | Status: DC

## 2021-08-23 NOTE — Telephone Encounter (Signed)
Blood thinner request sent to Dr. Rockey Situ for Plavix.

## 2021-08-23 NOTE — Telephone Encounter (Signed)
Inbound call from pt needing to sch his 3 yr recall colonoscopy. Thank you.

## 2021-08-23 NOTE — Telephone Encounter (Signed)
Patient stated that he would like to have his colonoscopy done on 10/06/2021 with Dr. Vicente Males. This will be scheduled for him.

## 2021-08-24 ENCOUNTER — Other Ambulatory Visit: Payer: Self-pay | Admitting: Primary Care

## 2021-08-24 DIAGNOSIS — E782 Mixed hyperlipidemia: Secondary | ICD-10-CM | POA: Diagnosis not present

## 2021-08-24 DIAGNOSIS — G4733 Obstructive sleep apnea (adult) (pediatric): Secondary | ICD-10-CM | POA: Diagnosis not present

## 2021-08-24 DIAGNOSIS — Z794 Long term (current) use of insulin: Secondary | ICD-10-CM | POA: Diagnosis not present

## 2021-08-24 DIAGNOSIS — I2510 Atherosclerotic heart disease of native coronary artery without angina pectoris: Secondary | ICD-10-CM | POA: Diagnosis not present

## 2021-08-24 DIAGNOSIS — E1142 Type 2 diabetes mellitus with diabetic polyneuropathy: Secondary | ICD-10-CM | POA: Diagnosis not present

## 2021-08-24 DIAGNOSIS — I5022 Chronic systolic (congestive) heart failure: Secondary | ICD-10-CM | POA: Diagnosis not present

## 2021-08-24 DIAGNOSIS — E669 Obesity, unspecified: Secondary | ICD-10-CM | POA: Diagnosis not present

## 2021-08-24 DIAGNOSIS — Z6836 Body mass index (BMI) 36.0-36.9, adult: Secondary | ICD-10-CM | POA: Diagnosis not present

## 2021-08-24 DIAGNOSIS — E785 Hyperlipidemia, unspecified: Secondary | ICD-10-CM

## 2021-08-24 DIAGNOSIS — E781 Pure hyperglyceridemia: Secondary | ICD-10-CM | POA: Diagnosis not present

## 2021-08-24 NOTE — Telephone Encounter (Signed)
Patient cancelled his CPE's for November 2022 and January 2023. What's going on?  Needs CPE. Okay to wait until we move back if that's the reason. Needs CPE no later than February 2023.  Let me know what's going on.

## 2021-08-25 ENCOUNTER — Telehealth: Payer: Self-pay | Admitting: Student in an Organized Health Care Education/Training Program

## 2021-08-25 ENCOUNTER — Telehealth: Payer: Self-pay | Admitting: Gastroenterology

## 2021-08-25 DIAGNOSIS — E781 Pure hyperglyceridemia: Secondary | ICD-10-CM

## 2021-08-25 DIAGNOSIS — E7800 Pure hypercholesterolemia, unspecified: Secondary | ICD-10-CM

## 2021-08-25 DIAGNOSIS — I25118 Atherosclerotic heart disease of native coronary artery with other forms of angina pectoris: Secondary | ICD-10-CM

## 2021-08-25 MED ORDER — CLOPIDOGREL BISULFATE 75 MG PO TABS: 75.0000 mg | ORAL_TABLET | Freq: Every day | ORAL | 0 refills | Status: AC

## 2021-08-25 MED ORDER — EZETIMIBE 10 MG PO TABS: 10.0000 mg | ORAL_TABLET | Freq: Every day | ORAL | 0 refills | Status: AC

## 2021-08-25 NOTE — Telephone Encounter (Signed)
Called patient states that it is due to the move I have made appointment for Feb.

## 2021-08-25 NOTE — Telephone Encounter (Signed)
Refills sent to pharmacy. 

## 2021-08-25 NOTE — Telephone Encounter (Signed)
Wants a returned call from [place] to discuss medication

## 2021-08-25 NOTE — Telephone Encounter (Signed)
Please schedule a virtual appointment with Dr Holley Raring.

## 2021-08-28 ENCOUNTER — Emergency Department
Admission: EM | Admit: 2021-08-28 | Discharge: 2021-08-28 | Disposition: A | Discharge status: Home / Self Care | Payer: Medicare Other | Attending: Emergency Medicine | Admitting: Emergency Medicine

## 2021-08-28 ENCOUNTER — Emergency Department: Payer: Medicare Other

## 2021-08-28 ENCOUNTER — Other Ambulatory Visit: Payer: Self-pay

## 2021-08-28 ENCOUNTER — Encounter: Payer: Self-pay | Admitting: Emergency Medicine

## 2021-08-28 DIAGNOSIS — Z951 Presence of aortocoronary bypass graft: Secondary | ICD-10-CM | POA: Diagnosis not present

## 2021-08-28 DIAGNOSIS — Z87891 Personal history of nicotine dependence: Secondary | ICD-10-CM | POA: Insufficient documentation

## 2021-08-28 DIAGNOSIS — Z7982 Long term (current) use of aspirin: Secondary | ICD-10-CM | POA: Diagnosis not present

## 2021-08-28 DIAGNOSIS — Z794 Long term (current) use of insulin: Secondary | ICD-10-CM | POA: Diagnosis not present

## 2021-08-28 DIAGNOSIS — Z79899 Other long term (current) drug therapy: Secondary | ICD-10-CM | POA: Diagnosis not present

## 2021-08-28 DIAGNOSIS — I2510 Atherosclerotic heart disease of native coronary artery without angina pectoris: Secondary | ICD-10-CM | POA: Diagnosis not present

## 2021-08-28 DIAGNOSIS — E1140 Type 2 diabetes mellitus with diabetic neuropathy, unspecified: Secondary | ICD-10-CM | POA: Diagnosis not present

## 2021-08-28 DIAGNOSIS — I739 Peripheral vascular disease, unspecified: Secondary | ICD-10-CM | POA: Diagnosis not present

## 2021-08-28 DIAGNOSIS — Z7901 Long term (current) use of anticoagulants: Secondary | ICD-10-CM | POA: Insufficient documentation

## 2021-08-28 DIAGNOSIS — M1711 Unilateral primary osteoarthritis, right knee: Secondary | ICD-10-CM | POA: Diagnosis not present

## 2021-08-28 DIAGNOSIS — I5022 Chronic systolic (congestive) heart failure: Secondary | ICD-10-CM | POA: Diagnosis not present

## 2021-08-28 DIAGNOSIS — M25561 Pain in right knee: Secondary | ICD-10-CM | POA: Diagnosis not present

## 2021-08-28 DIAGNOSIS — I110 Hypertensive heart disease with heart failure: Secondary | ICD-10-CM | POA: Insufficient documentation

## 2021-08-28 MED ORDER — OXYCODONE HCL 5 MG PO TABS: 5.0000 mg | ORAL_TABLET | Freq: Four times a day (QID) | ORAL | 0 refills | Status: DC | PRN

## 2021-08-28 MED ORDER — NA SULFATE-K SULFATE-MG SULF 17.5-3.13-1.6 GM/177ML PO SOLN: 354.0000 mL | Freq: Once | ORAL | 0 refills | Status: AC

## 2021-08-28 NOTE — Discharge Instructions (Signed)
Keep your appointment at [facility] for your knee at your scheduled appointment.  A prescription for pain medication was sent to your pharmacy to be taken every 6 hours if needed.  Wear knee immobilizer when up walking and use walker for stability and to help with weightbearing.  Continue with your regular medication.  Return to the emergency department if any severe worsening of your symptoms or urgent concerns.  Discontinue water aerobics until seen by the orthopedic department.

## 2021-08-28 NOTE — ED Notes (Signed)
See triage note. Pt to ED for R knee pain that started last week after water aerobics class. Pain has not improved over last several days. Pt missed last scheduled corticosteroid injection. Pt states R knee feels "unstable and wobbly".  Using cane which is not his baseline.

## 2021-08-28 NOTE — Telephone Encounter (Signed)
Alled patient back and he stated that he would prefer taking Suprep instead of Golytely. I told him that I would send it to his pharmacy [facility]. Patient had no further questions.

## 2021-08-28 NOTE — ED Triage Notes (Signed)
C/O right knee pain since Thursday.  STAtes knee was sore after last water aerobics class. Worse over past three days.  Feels that knee is unstable and painful.

## 2021-08-28 NOTE — ED Provider Notes (Signed)
[facility] Emergency Department Provider Note  ____________________________________________   Event Date/Time   First MD Initiated Contact with Patient 08/28/21 1055     (approximate)  I have reviewed the triage vital signs and the nursing notes.   HISTORY  Chief Complaint Knee Pain    HPI Brian Williams is a 60 y.o. male presents to the ED with complaint of right knee pain for the last 4 days.  Patient states that he participated in a water aerobics class and initially had some soreness and stiffness.  He states over the last 3 days pain has increased and that his knee feels unstable when weightbearing.  He denies any known injury.  He has a history of chronic knee problems and is seen at [facility] orthopedic department for injections.  Currently he is scheduled for a steroid injection at the end of the month.  He denies any previous DVT's.  Rates his pain as an 8 out of 10.        Past Medical History:  Diagnosis Date   AICD (automatic cardioverter/defibrillator) present    Allergy    Anxiety    takes Xanax as needed   Appendicitis    Arthritis    back,knees,right shoulder   Back pain    Cardiomyopathy ([place])    CHF (congestive heart failure) (HCC)    takes Lasix and Aldactone daily   Coronary artery disease    takes Plavix daily   Depression    takes Zoloft daily   Diabetes mellitus without complication (HCC)    Humulin R and Farxiga daily.Fasting blood sugar runs 140   GERD (gastroesophageal reflux disease)    Headache    History of bronchitis    History of colon polyps    benign   History of hiatal hernia    Hyperlipidemia    takes Fenofibrate,Crestor, and Zetia daily   Hypertension    takes Entresto and Coreg daily   MI (myocardial infarction) ([place]) 2001   Obesity    Peripheral neuropathy    Pneumonia    history of-last time about 14 yrs ago   PONV (postoperative nausea and vomiting)    after knee surgery 25 yrs ago b/p  stayed elevated for a while   Presence of permanent cardiac pacemaker    Shortness of breath dyspnea    with exertion   Sleep apnea    uses CPAP nightly   Ventricular tachycardia    s/p RFCA PVCs 2013    Patient Active Problem List   Diagnosis Date Noted   Viral illness 07/13/2021   Lumbar degenerative disc disease 05/11/2021   Lumbar facet arthropathy 05/11/2021   Lumbar post-laminectomy syndrome 05/11/2021   Chronic pain syndrome 05/11/2021   Chronic SI joint pain 05/11/2021   Low back pain 05/10/2021   Acute appendicitis 04/03/2021   Depression 04/03/2021   Diarrhea 12/06/2020   Hypotension 12/06/2020   Tingling 12/01/2020   Vision changes 12/01/2020   Dizziness 11/23/2020   Sinusitis, acute 08/08/2020   Diabetic neuropathy associated with type 2 diabetes mellitus ([place]) 08/05/2020   Falls, initial encounter 08/05/2020   Leg pain, bilateral 08/05/2020   Sensory ataxia 08/05/2020   Chronic knee pain 07/25/2020   Myalgia 01/28/2019   Major depressive disorder 07/08/2017   Erectile dysfunction 07/08/2017   Hx of CABG 07/26/2016   Coronary artery disease 05/24/2016   Atherosclerosis of native coronary artery of native heart with stable angina pectoris ([place])    History of  coronary artery stent placement    Unstable angina ([place]) 05/04/2016   Obesity (BMI 30-39.9) 04/06/2016   OSA on CPAP 01/06/2016   Diabetes, polyneuropathy ([place]) 01/06/2016   Hidradenitis suppurativa 10/06/2015   Chronic back pain 05/02/2015   Essential hypertension 05/02/2015   Hyperlipidemia 05/02/2015   Generalized anxiety disorder 05/02/2015   ICD (implantable cardioverter-defibrillator) in place 04/20/2015   Congestive dilated cardiomyopathy ([place]) 04/20/2015   Type 2 diabetes mellitus with complications ([place]) 32/44/0102   Chronic systolic heart failure ([place]) 09/30/2014    Past Surgical History:  Procedure Laterality Date   APPENDECTOMY     BACK SURGERY     CARDIAC CATHETERIZATION      CARDIAC CATHETERIZATION Left 05/10/2016   Procedure: Left Heart Cath and Coronary Angiography;  Surgeon: Minna Merritts, MD;  Location: [facility];  Service: Cardiovascular;  Laterality: Left;   CARDIAC DEFIBRILLATOR PLACEMENT  10/16/2007   ICD Model number 2207-36 serial number [id number]   CARDIAC ELECTROPHYSIOLOGY STUDY AND ABLATION     CHOLECYSTECTOMY  2001   COLONOSCOPY     COLONOSCOPY WITH PROPOFOL N/A 08/28/2018   Procedure: COLONOSCOPY WITH PROPOFOL;  Surgeon: Jonathon Bellows, MD;  Location: [facility] ENDOSCOPY;  Service: Gastroenterology;  Laterality: N/A;   CORONARY ANGIOPLASTY WITH STENT PLACEMENT     7 stents   CORONARY ARTERY BYPASS GRAFT N/A 05/24/2016   Procedure: CORONARY ARTERY BYPASS GRAFTING (CABG) x four , using left internal mammary artery and left leg greater saphenous vein harvested endoscopically;  Surgeon: Gaye Pollack, MD;  Location: [place] [facility];  Service: Open Heart Surgery;  Laterality: N/A;   EP IMPLANTABLE DEVICE N/A 06/16/2015   Procedure: ICD Generator Changeout;  Surgeon: Deboraha Sprang, MD;  Location: [facility] CV LAB;  Service: Cardiovascular;  Laterality: N/A;   ESOPHAGOGASTRODUODENOSCOPY     INSERT / REPLACE / REMOVE PACEMAKER     KNEE SURGERY     bilateral knee    [name] WITHOUT CARDIOVERSION N/A 05/24/2016   Procedure: TRANSESOPHAGEAL ECHOCARDIOGRAM ([name]);  Surgeon: Gaye Pollack, MD;  Location: [facility];  Service: Open Heart Surgery;  Laterality: N/A;   VASECTOMY      Prior to Admission medications   Medication Sig Start Date End Date Taking? Authorizing Provider  oxyCODONE (OXY IR/ROXICODONE) 5 MG immediate release tablet Take 1 tablet (5 mg total) by mouth every 6 (six) hours as needed for severe pain. 08/28/21  Yes Johnn Hai, PA-C  aspirin EC 81 MG tablet Take 81 mg by mouth daily.  06/21/16   Minna Merritts, MD  clopidogrel (PLAVIX) 75 MG tablet Take 1 tablet (75 mg total) by mouth daily. 08/25/21   Pleas Koch, NP  escitalopram  (LEXAPRO) 20 MG tablet TAKE 1 TABLET BY MOUTH  DAILY FOR ANXIETY AND  DEPRESSION 05/09/21   Tonia Ghent, MD  ezetimibe (ZETIA) 10 MG tablet Take 1 tablet (10 mg total) by mouth daily. For cholesterol. 08/25/21   Pleas Koch, NP  furosemide (LASIX) 40 MG tablet Take 80 mg in the am & 40 mg in the pm.    [provider]  gabapentin (NEURONTIN) 600 MG tablet TAKE TWO TABLETS BY MOUTH TWICE A DAY FOR PAIN 05/28/21   Pleas Koch, NP  gemfibrozil (LOPID) 600 MG tablet TAKE 1 TABLET( 600MG  TOTAL) BY MOUTH DAILY FOR CHOLESTEROL 08/25/21   Pleas Koch, NP  insulin regular human CONCENTRATED (HUMULIN R) 500 UNIT/ML injection Inject 0-25 Units into the skin daily at 12  noon.    [provider]  methocarbamol (ROBAXIN) 750 MG tablet Take 1 tablet (750 mg total) by mouth every 8 (eight) hours as needed for muscle spasms. 06/22/21   Gillis Santa, MD  metoprolol succinate (TOPROL-XL) 50 MG 24 hr tablet Take 50 mg by mouth daily. 03/14/21   [provider]  nitroGLYCERIN (NITROSTAT) 0.4 MG SL tablet Place 1 tablet (0.4 mg total) under the tongue every 5 (five) minutes as needed for chest pain. 04/28/21   Pleas Koch, NP  omega-3 acid ethyl esters (LOVAZA) 1 g capsule Take 2 capsules by mouth 2 (two) times daily. 03/22/21   [provider]  polyethylene glycol-electrolytes (NULYTELY) 420 g solution Prepare according to package instructions. Starting at 5:00 PM: Drink one 8 oz glass of mixture every 15 minutes until you finish half of the jug. Five hours prior to procedure, drink 8 oz glass of mixture every 15 minutes until it is all gone. Make sure you do not drink anything 4 hours prior to your procedure. 08/23/21   Jonathon Bellows, MD  rosuvastatin (CRESTOR) 20 MG tablet TAKE 1 TABLET BY MOUTH AT  BEDTIME FOR CHOLESTEROL 08/15/21   Pleas Koch, NP  sacubitril-valsartan (ENTRESTO) 97-103 MG Take 0.5 tablets by mouth 2 (two) times daily. 01/18/21 01/18/22   [provider]  Semaglutide, 2 MG/DOSE, (OZEMPIC, 2 MG/DOSE,) 8 MG/3ML SOPN Inject 2 mg into the skin once a week.    [provider]  sildenafil (REVATIO) 20 MG tablet Take 2 to 5 tablets by mouth 1 hour prior to intercourse as needed. 11/15/20   Pleas Koch, NP  spironolactone (ALDACTONE) 50 MG tablet Take 25 mg by mouth daily. 1/2 tab bid    [provider]  traZODone (DESYREL) 100 MG tablet TAKE 2 TABLETS BY MOUTH  EVERY NIGHT AT BEDTIME for sleep. 08/09/21   Pleas Koch, NP    Allergies Naproxen and Morphine and related  Family History  Problem Relation Age of Onset   Heart attack Mother 6   Hypertension Mother    Melanoma Mother    Heart attack Father 83   Hypertension Father    Hypertension Maternal Uncle    Heart disease Maternal Uncle    Heart disease Maternal Grandmother    Stroke Maternal Grandmother    Diabetes Neg Hx     Social History Social History   Tobacco Use   Smoking status: Former    Packs/day: 1.00    Years: 25.00    Pack years: 25.00    Types: Cigarettes    Quit date: 05/19/2018    Years since quitting: 3.2   Smokeless tobacco: Never  Vaping Use   Vaping Use: Never used  Substance Use Topics   Alcohol use: No    Comment: rare   Drug use: No    Review of Systems Constitutional: No fever/chills Eyes: No visual changes. Cardiovascular: Denies chest pain. Respiratory: Denies shortness of breath. Gastrointestinal:  No nausea, no vomiting.  Musculoskeletal: Positive right knee pain. Skin: Negative for rash. Neurological: Negative for headaches, focal weakness or numbness. ____________________________________________   PHYSICAL EXAM:  VITAL SIGNS: ED Triage Vitals  Enc Vitals Group     BP 08/28/21 0930 113/71     Pulse Rate 08/28/21 0930 80     Resp 08/28/21 0930 16     Temp 08/28/21 0930 99.2 F (37.3 C)     Temp Source 08/28/21 0930 Oral     SpO2 08/28/21 0930 97 %  Weight 08/28/21 0931 270  lb 1 oz (122.5 kg)     Height 08/28/21 0931 6' (1.829 m)     Head Circumference --      Peak Flow --      Pain Score 08/28/21 0930 8     Pain Loc --      Pain Edu? --      Excl. in [place]? --     Constitutional: Alert and oriented. Well appearing and in no acute distress. Eyes: Conjunctivae are normal.  Head: Atraumatic. Neck: No stridor.   Cardiovascular: Normal rate, regular rhythm. Grossly normal heart sounds.  Good peripheral circulation. Respiratory: Normal respiratory effort.  No retractions. Lungs CTAB. Gastrointestinal: Soft and nontender. No distention.  Musculoskeletal: No gross deformity is noted on examination of the right knee however there is marked tenderness generalized on palpation of the patella and peripatellar area.  Minimal soft tissue edema.  No erythema or discoloration noted.  No warmth.  Skin is intact.  No tenderness on palpation of the posterior lower extremity.  Bevelyn Buckles' sign is negative.  No pitting edema noted lower extremity.  Range of motion is moderately restricted secondary to increased pain.  Ligaments bilaterally were stable but moderate discomfort with testing.  Crepitus noted. Neurologic:  Normal speech and language. No gross focal neurologic deficits are appreciated.  Gait was not tested secondary to increased pain. Skin:  Skin is warm, dry and intact. No rash noted. Psychiatric: Mood and affect are normal. Speech and behavior are normal.  ____________________________________________   LABS (all labs ordered are listed, but only abnormal results are displayed)  Labs Reviewed - No data to display ____________________________________________ ____________________________________________  RADIOLOGY Leana Gamer, personally viewed and evaluated these images (plain radiographs) as part of my medical decision making, as well as reviewing the written report by the radiologist.   Official radiology report(s): DG Knee Complete 4 Views Right  Result  Date: 08/28/2021 CLINICAL DATA:  60 year old male with knee pain for 4 days. EXAM: RIGHT KNEE - COMPLETE 4+ VIEW COMPARISON:  02/01/2020 knee series. FINDINGS: Bulky chronic patellofemoral compartment degenerative spurring. Moderate to severe joint space loss there, and up to moderate joint space loss and degenerative spurring in the lateral compartment. No joint effusion identified. Patella appears intact. No acute osseous abnormality identified. Calcified peripheral vascular disease. IMPRESSION: 1. Advanced lateral and patellofemoral compartment degenerative changes. No acute osseous abnormality identified. 2. Calcified peripheral vascular disease. Electronically Signed   By: Genevie Ann M.D.   On: 08/28/2021 09:59    ____________________________________________   PROCEDURES  Procedure(s) performed (including Critical Care):  Procedures   ____________________________________________   INITIAL IMPRESSION / ASSESSMENT AND PLAN / ED COURSE  As part of my medical decision making, I reviewed the following data within the electronic MEDICAL RECORD NUMBER Notes from prior ED visits and [place] Controlled Substance Database  60 year old male presents to the ED with complaint of right knee pain that has worsened in the last 3 days.  Patient did a water aerobics class after approximately 3 months of not attending class.  Initially he had some soreness and stiffness which he expected.  His pain now is centered around the patella and anterior portion of his knee.  Patient has a history of chronic knee issues and routinely gets injections at an orthopedic department.  Currently he has an appointment for 09/08/2021.  X-ray of his knee was discussed and patient was shown his degenerative changes.  A prescription for oxycodone 5 mg was sent  to his pharmacy.  Patient was placed in a knee immobilizer and also a walker for increased stability and assistance with walking.  Patient is aware that he does not need to wear this  while sleeping.  He is encouraged to keep his appointment at [facility] orthopedic department.  ____________________________________________   FINAL CLINICAL IMPRESSION(S) / ED DIAGNOSES  Final diagnoses:  Osteoarthritis of right knee, unspecified osteoarthritis type  Acute pain of right knee     ED Discharge Orders          Ordered    oxyCODONE (OXY IR/ROXICODONE) 5 MG immediate release tablet  Every 6 hours PRN        08/28/21 1144             Note:  This document was prepared using Dragon voice recognition software and may include unintentional dictation errors.    Johnn Hai, PA-C 08/28/21 1313    Vladimir Crofts, MD 08/28/21 [phone]

## 2021-08-28 NOTE — ED Notes (Signed)
Pt will discharge once knee brace in place and walker given by ED tech.

## 2021-08-28 NOTE — Telephone Encounter (Signed)
Blood thiner request has to be faxed to [facility] for his Plavix to be held. Ree Edman, ANP   [address]   Medicine   [id number] Brian [place], [place] [address]   [phone] (Work)   [phone] (Fax)

## 2021-08-29 DIAGNOSIS — M1711 Unilateral primary osteoarthritis, right knee: Secondary | ICD-10-CM | POA: Diagnosis not present

## 2021-08-29 DIAGNOSIS — M179 Osteoarthritis of knee, unspecified: Secondary | ICD-10-CM | POA: Insufficient documentation

## 2021-08-29 DIAGNOSIS — S86919A Strain of unspecified muscle(s) and tendon(s) at lower leg level, unspecified leg, initial encounter: Secondary | ICD-10-CM | POA: Insufficient documentation

## 2021-08-29 DIAGNOSIS — M1712 Unilateral primary osteoarthritis, left knee: Secondary | ICD-10-CM | POA: Insufficient documentation

## 2021-08-29 DIAGNOSIS — S86911A Strain of unspecified muscle(s) and tendon(s) at lower leg level, right leg, initial encounter: Secondary | ICD-10-CM | POA: Diagnosis not present

## 2021-08-31 NOTE — Addendum Note (Signed)
Addended by: Lurlean Nanny on: 08/31/2021 08:56 AM   Modules accepted: Orders

## 2021-09-01 ENCOUNTER — Telehealth: Payer: Self-pay | Admitting: Primary Care

## 2021-09-01 NOTE — Telephone Encounter (Signed)
I cannot provide refills. He can go to the orthopedic walk in clinic over the weekend if needed.

## 2021-09-01 NOTE — Telephone Encounter (Signed)
Patient informed has placed call to ortho walk in where he was seen early in week but not able to get call back. He will go to [facility] walk in if medication needed.

## 2021-09-01 NOTE — Telephone Encounter (Signed)
Called patient states he was seen at ED 12/12 given oxy 5 mg. He will be out of that script on 12/17 am. He was seen at [facility] ortho Monday and was not able to be be treated from them has appointment at [facility] with his normal orthopedic. He has been bracing and using ice but lowest pain level is 8/10 and that is with no movement and taking meds. He would like to get oxy to last until seen at [facility]. He is currently taking 5mg  every 6 hrs and 4 tylenol and 2 ibuprofen ever 4 hours.

## 2021-09-01 NOTE — Telephone Encounter (Signed)
Pt called stating that he blew out his knee on Monday and is in a lot of pain and can not stand. Pt states that he has an upcoming appt on Monday with the Orthopedic doctor. Pt states that the ER gave him 5 mg of Oxy. Pt is asking could he get 10 pills of Oxy to hold him through the weekend. Please advise.

## 2021-09-04 DIAGNOSIS — M1711 Unilateral primary osteoarthritis, right knee: Secondary | ICD-10-CM | POA: Diagnosis not present

## 2021-09-04 DIAGNOSIS — M25561 Pain in right knee: Secondary | ICD-10-CM | POA: Diagnosis not present

## 2021-09-04 DIAGNOSIS — M25461 Effusion, right knee: Secondary | ICD-10-CM | POA: Diagnosis not present

## 2021-09-06 ENCOUNTER — Emergency Department: Payer: Medicare Other

## 2021-09-06 ENCOUNTER — Inpatient Hospital Stay
Admission: EM | Admit: 2021-09-06 | Discharge: 2021-09-09 | DRG: 871 | Disposition: A | Discharge status: Home / Self Care | Payer: Medicare Other | Attending: Internal Medicine | Admitting: Internal Medicine

## 2021-09-06 ENCOUNTER — Other Ambulatory Visit: Payer: Self-pay

## 2021-09-06 DIAGNOSIS — Z9049 Acquired absence of other specified parts of digestive tract: Secondary | ICD-10-CM

## 2021-09-06 DIAGNOSIS — J150 Pneumonia due to Klebsiella pneumoniae: Secondary | ICD-10-CM | POA: Diagnosis present

## 2021-09-06 DIAGNOSIS — I255 Ischemic cardiomyopathy: Secondary | ICD-10-CM | POA: Diagnosis present

## 2021-09-06 DIAGNOSIS — I110 Hypertensive heart disease with heart failure: Secondary | ICD-10-CM | POA: Diagnosis not present

## 2021-09-06 DIAGNOSIS — E1140 Type 2 diabetes mellitus with diabetic neuropathy, unspecified: Secondary | ICD-10-CM | POA: Diagnosis present

## 2021-09-06 DIAGNOSIS — Z9641 Presence of insulin pump (external) (internal): Secondary | ICD-10-CM | POA: Diagnosis present

## 2021-09-06 DIAGNOSIS — G4733 Obstructive sleep apnea (adult) (pediatric): Secondary | ICD-10-CM | POA: Diagnosis present

## 2021-09-06 DIAGNOSIS — Z95810 Presence of automatic (implantable) cardiac defibrillator: Secondary | ICD-10-CM | POA: Diagnosis not present

## 2021-09-06 DIAGNOSIS — Z20822 Contact with and (suspected) exposure to covid-19: Secondary | ICD-10-CM | POA: Diagnosis not present

## 2021-09-06 DIAGNOSIS — E871 Hypo-osmolality and hyponatremia: Secondary | ICD-10-CM | POA: Diagnosis not present

## 2021-09-06 DIAGNOSIS — R0602 Shortness of breath: Secondary | ICD-10-CM | POA: Diagnosis not present

## 2021-09-06 DIAGNOSIS — E876 Hypokalemia: Secondary | ICD-10-CM | POA: Diagnosis present

## 2021-09-06 DIAGNOSIS — M109 Gout, unspecified: Secondary | ICD-10-CM | POA: Diagnosis not present

## 2021-09-06 DIAGNOSIS — G4700 Insomnia, unspecified: Secondary | ICD-10-CM | POA: Diagnosis present

## 2021-09-06 DIAGNOSIS — B961 Klebsiella pneumoniae [K. pneumoniae] as the cause of diseases classified elsewhere: Secondary | ICD-10-CM | POA: Diagnosis not present

## 2021-09-06 DIAGNOSIS — R079 Chest pain, unspecified: Secondary | ICD-10-CM | POA: Diagnosis not present

## 2021-09-06 DIAGNOSIS — Z6836 Body mass index (BMI) 36.0-36.9, adult: Secondary | ICD-10-CM

## 2021-09-06 DIAGNOSIS — R4182 Altered mental status, unspecified: Secondary | ICD-10-CM | POA: Diagnosis not present

## 2021-09-06 DIAGNOSIS — E1165 Type 2 diabetes mellitus with hyperglycemia: Secondary | ICD-10-CM | POA: Diagnosis present

## 2021-09-06 DIAGNOSIS — F32A Depression, unspecified: Secondary | ICD-10-CM | POA: Diagnosis not present

## 2021-09-06 DIAGNOSIS — K219 Gastro-esophageal reflux disease without esophagitis: Secondary | ICD-10-CM | POA: Diagnosis present

## 2021-09-06 DIAGNOSIS — Z951 Presence of aortocoronary bypass graft: Secondary | ICD-10-CM | POA: Diagnosis not present

## 2021-09-06 DIAGNOSIS — Z823 Family history of stroke: Secondary | ICD-10-CM

## 2021-09-06 DIAGNOSIS — A419 Sepsis, unspecified organism: Secondary | ICD-10-CM | POA: Diagnosis present

## 2021-09-06 DIAGNOSIS — J189 Pneumonia, unspecified organism: Secondary | ICD-10-CM | POA: Diagnosis not present

## 2021-09-06 DIAGNOSIS — E785 Hyperlipidemia, unspecified: Secondary | ICD-10-CM | POA: Diagnosis present

## 2021-09-06 DIAGNOSIS — E669 Obesity, unspecified: Secondary | ICD-10-CM | POA: Diagnosis present

## 2021-09-06 DIAGNOSIS — Z794 Long term (current) use of insulin: Secondary | ICD-10-CM

## 2021-09-06 DIAGNOSIS — D6489 Other specified anemias: Secondary | ICD-10-CM | POA: Diagnosis not present

## 2021-09-06 DIAGNOSIS — R6520 Severe sepsis without septic shock: Secondary | ICD-10-CM | POA: Diagnosis not present

## 2021-09-06 DIAGNOSIS — Z87891 Personal history of nicotine dependence: Secondary | ICD-10-CM | POA: Diagnosis not present

## 2021-09-06 DIAGNOSIS — Z7902 Long term (current) use of antithrombotics/antiplatelets: Secondary | ICD-10-CM | POA: Diagnosis not present

## 2021-09-06 DIAGNOSIS — N10 Acute tubulo-interstitial nephritis: Secondary | ICD-10-CM | POA: Diagnosis not present

## 2021-09-06 DIAGNOSIS — M1711 Unilateral primary osteoarthritis, right knee: Secondary | ICD-10-CM | POA: Diagnosis present

## 2021-09-06 DIAGNOSIS — I2510 Atherosclerotic heart disease of native coronary artery without angina pectoris: Secondary | ICD-10-CM | POA: Diagnosis present

## 2021-09-06 DIAGNOSIS — Z808 Family history of malignant neoplasm of other organs or systems: Secondary | ICD-10-CM

## 2021-09-06 DIAGNOSIS — M25561 Pain in right knee: Secondary | ICD-10-CM

## 2021-09-06 DIAGNOSIS — N3000 Acute cystitis without hematuria: Secondary | ICD-10-CM | POA: Diagnosis present

## 2021-09-06 DIAGNOSIS — I5022 Chronic systolic (congestive) heart failure: Secondary | ICD-10-CM | POA: Diagnosis present

## 2021-09-06 DIAGNOSIS — Z7982 Long term (current) use of aspirin: Secondary | ICD-10-CM

## 2021-09-06 DIAGNOSIS — R7881 Bacteremia: Secondary | ICD-10-CM | POA: Diagnosis present

## 2021-09-06 DIAGNOSIS — R000 Tachycardia, unspecified: Secondary | ICD-10-CM | POA: Diagnosis not present

## 2021-09-06 DIAGNOSIS — Z955 Presence of coronary angioplasty implant and graft: Secondary | ICD-10-CM

## 2021-09-06 DIAGNOSIS — Z79899 Other long term (current) drug therapy: Secondary | ICD-10-CM

## 2021-09-06 DIAGNOSIS — Z8249 Family history of ischemic heart disease and other diseases of the circulatory system: Secondary | ICD-10-CM

## 2021-09-06 DIAGNOSIS — R778 Other specified abnormalities of plasma proteins: Secondary | ICD-10-CM | POA: Diagnosis not present

## 2021-09-06 DIAGNOSIS — I517 Cardiomegaly: Secondary | ICD-10-CM | POA: Diagnosis not present

## 2021-09-06 DIAGNOSIS — N390 Urinary tract infection, site not specified: Secondary | ICD-10-CM | POA: Diagnosis not present

## 2021-09-06 DIAGNOSIS — A4159 Other Gram-negative sepsis: Secondary | ICD-10-CM | POA: Diagnosis not present

## 2021-09-06 DIAGNOSIS — R0609 Other forms of dyspnea: Secondary | ICD-10-CM | POA: Diagnosis not present

## 2021-09-06 HISTORY — DX: Sepsis, unspecified organism: A41.9

## 2021-09-06 LAB — COMPREHENSIVE METABOLIC PANEL
ALT: 21 U/L (ref 0–44)
AST: 25 U/L (ref 15–41)
Albumin: 3.6 g/dL (ref 3.5–5.0)
Alkaline Phosphatase: 45 U/L (ref 38–126)
Anion gap: 12 (ref 5–15)
BUN: 27 mg/dL — ABNORMAL HIGH (ref 6–20)
CO2: 18 mmol/L — ABNORMAL LOW (ref 22–32)
Calcium: 8.9 mg/dL (ref 8.9–10.3)
Chloride: 94 mmol/L — ABNORMAL LOW (ref 98–111)
Creatinine, Ser: 0.83 mg/dL (ref 0.61–1.24)
GFR, Estimated: >60 mL/min (ref >60)
Glucose, Bld: 365 mg/dL — ABNORMAL HIGH (ref 70–99)
Potassium: 3.8 mmol/L (ref 3.5–5.1)
Sodium: 124 mmol/L — ABNORMAL LOW (ref 135–145)
Total Bilirubin: 0.8 mg/dL (ref 0.3–1.2)
Total Protein: 7.2 g/dL (ref 6.5–8.1)

## 2021-09-06 LAB — URINALYSIS, ROUTINE W REFLEX MICROSCOPIC
Bilirubin Urine: NEGATIVE
Glucose, UA: >1000 mg/dL — AB
Ketones, ur: 15 mg/dL — AB
Leukocytes,Ua: NEGATIVE
Nitrite: NEGATIVE
Protein, ur: 30 mg/dL — AB
Specific Gravity, Urine: 1.015 (ref 1.005–1.030)
Squamous Epithelial / HPF: NONE SEEN (ref 0–5)
pH: 5.5 (ref 5.0–8.0)

## 2021-09-06 LAB — BLOOD CULTURE ID PANEL (REFLEXED) - BCID2

## 2021-09-06 LAB — CBC WITH DIFFERENTIAL/PLATELET
Abs Immature Granulocytes: 0.04 10*3/uL (ref 0.00–0.07)
Basophils Absolute: 0 10*3/uL (ref 0.0–0.1)
Basophils Relative: 0 %
Eosinophils Absolute: 0 10*3/uL (ref 0.0–0.5)
Eosinophils Relative: 0 %
HCT: 26.1 % — ABNORMAL LOW (ref 39.0–52.0)
Hemoglobin: 9.5 g/dL — ABNORMAL LOW (ref 13.0–17.0)
Immature Granulocytes: 1 %
Lymphocytes Relative: 19 %
Lymphs Abs: 1 10*3/uL (ref 0.7–4.0)
MCH: 32 pg (ref 26.0–34.0)
MCHC: 36.4 g/dL — ABNORMAL HIGH (ref 30.0–36.0)
MCV: 87.9 fL (ref 80.0–100.0)
Monocytes Absolute: 0.4 10*3/uL (ref 0.1–1.0)
Monocytes Relative: 9 %
Neutro Abs: 3.7 10*3/uL (ref 1.7–7.7)
Neutrophils Relative %: 71 %
Platelets: 134 10*3/uL — ABNORMAL LOW (ref 150–400)
RBC: 2.97 MIL/uL — ABNORMAL LOW (ref 4.22–5.81)
RDW: 14.1 % (ref 11.5–15.5)
WBC: 5.2 10*3/uL (ref 4.0–10.5)
nRBC: 0 % (ref 0.0–0.2)

## 2021-09-06 LAB — BRAIN NATRIURETIC PEPTIDE: B Natriuretic Peptide: 370.2 pg/mL — ABNORMAL HIGH (ref 0.0–100.0)

## 2021-09-06 LAB — CBG MONITORING, ED
Glucose-Capillary: 384 mg/dL — ABNORMAL HIGH (ref 70–99)
Glucose-Capillary: 336 mg/dL — ABNORMAL HIGH (ref 70–99)

## 2021-09-06 LAB — BLOOD GAS, VENOUS
Acid-base deficit: 3.6 mmol/L — ABNORMAL HIGH (ref 0.0–2.0)
Bicarbonate: 19.5 mmol/L — ABNORMAL LOW (ref 20.0–28.0)
O2 Saturation: 99.1 %
Patient temperature: 37
pCO2, Ven: 28 mmHg — ABNORMAL LOW (ref 44.0–60.0)
pH, Ven: 7.45 — ABNORMAL HIGH (ref 7.250–7.430)
pO2, Ven: 131 mmHg — ABNORMAL HIGH (ref 32.0–45.0)

## 2021-09-06 LAB — BETA-HYDROXYBUTYRIC ACID: Beta-Hydroxybutyric Acid: 0.81 mmol/L — ABNORMAL HIGH (ref 0.05–0.27)

## 2021-09-06 LAB — RESP PANEL BY RT-PCR (FLU A&B, COVID) ARPGX2
Influenza A by PCR: NEGATIVE
Influenza B by PCR: NEGATIVE
SARS Coronavirus 2 by RT PCR: NEGATIVE

## 2021-09-06 LAB — LACTIC ACID, PLASMA: Lactic Acid, Venous: 1.6 mmol/L (ref 0.5–1.9)

## 2021-09-06 LAB — SEDIMENTATION RATE: Sed Rate: 106 mm/hr — ABNORMAL HIGH (ref 0–20)

## 2021-09-06 LAB — TROPONIN I (HIGH SENSITIVITY)
Troponin I (High Sensitivity): 377 ng/L (ref <18)
Troponin I (High Sensitivity): 294 ng/L (ref <18)

## 2021-09-06 LAB — PROCALCITONIN: Procalcitonin: 5.71 ng/mL

## 2021-09-06 MED ORDER — CLOPIDOGREL BISULFATE 75 MG PO TABS
75.0000 mg | ORAL_TABLET | Freq: Every day | ORAL | Status: DC
  Administered 2021-09-06 – 2021-09-09 (×4): 75 mg via ORAL
  Filled 2021-09-06 (×4): qty 1

## 2021-09-06 MED ORDER — ASPIRIN EC 81 MG PO TBEC
81.0000 mg | DELAYED_RELEASE_TABLET | Freq: Every day | ORAL | Status: DC
  Administered 2021-09-06 – 2021-09-09 (×4): 81 mg via ORAL
  Filled 2021-09-06 (×4): qty 1

## 2021-09-06 MED ORDER — GEMFIBROZIL 600 MG PO TABS
600.0000 mg | ORAL_TABLET | Freq: Every day | ORAL | Status: DC
  Administered 2021-09-06 – 2021-09-09 (×4): 600 mg via ORAL
  Filled 2021-09-06 (×4): qty 1

## 2021-09-06 MED ORDER — SACUBITRIL-VALSARTAN 49-51 MG PO TABS
1.0000 | ORAL_TABLET | Freq: Two times a day (BID) | ORAL | Status: DC
  Administered 2021-09-07 – 2021-09-09 (×4): 1 via ORAL
  Filled 2021-09-06 (×7): qty 1

## 2021-09-06 MED ORDER — SODIUM CHLORIDE 0.9 % IV SOLN
500.0000 mg | INTRAVENOUS | Status: DC
  Administered 2021-09-06: 21:00:00 500 mg via INTRAVENOUS
  Filled 2021-09-06 (×2): qty 5

## 2021-09-06 MED ORDER — ONDANSETRON HCL 4 MG/2ML IJ SOLN
4.0000 mg | Freq: Once | INTRAMUSCULAR | Status: AC
  Administered 2021-09-06: 13:00:00 4 mg via INTRAVENOUS
  Filled 2021-09-06: qty 2

## 2021-09-06 MED ORDER — SODIUM CHLORIDE 0.9 % IV SOLN
2.0000 g | INTRAVENOUS | Status: DC
  Administered 2021-09-07 – 2021-09-09 (×3): 2 g via INTRAVENOUS
  Filled 2021-09-06 (×2): qty 2
  Filled 2021-09-06: qty 20

## 2021-09-06 MED ORDER — ONDANSETRON HCL 4 MG/2ML IJ SOLN
4.0000 mg | Freq: Four times a day (QID) | INTRAMUSCULAR | Status: DC | PRN
  Administered 2021-09-06 – 2021-09-08 (×5): 4 mg via INTRAVENOUS
  Filled 2021-09-06 (×5): qty 2

## 2021-09-06 MED ORDER — ESCITALOPRAM OXALATE 20 MG PO TABS
20.0000 mg | ORAL_TABLET | Freq: Every day | ORAL | Status: DC
  Administered 2021-09-06 – 2021-09-09 (×4): 20 mg via ORAL
  Filled 2021-09-06 (×3): qty 1
  Filled 2021-09-06: qty 2

## 2021-09-06 MED ORDER — METOPROLOL SUCCINATE ER 50 MG PO TB24
50.0000 mg | ORAL_TABLET | Freq: Every day | ORAL | Status: DC
  Administered 2021-09-06 – 2021-09-08 (×3): 50 mg via ORAL
  Filled 2021-09-06 (×4): qty 1

## 2021-09-06 MED ORDER — ACETAMINOPHEN 650 MG RE SUPP
650.0000 mg | Freq: Four times a day (QID) | RECTAL | Status: DC | PRN
  Filled 2021-09-06: qty 1

## 2021-09-06 MED ORDER — SPIRONOLACTONE 25 MG PO TABS
25.0000 mg | ORAL_TABLET | Freq: Every day | ORAL | Status: DC
  Administered 2021-09-06 – 2021-09-09 (×4): 25 mg via ORAL
  Filled 2021-09-06 (×4): qty 1

## 2021-09-06 MED ORDER — SODIUM CHLORIDE 0.9 % IV SOLN
INTRAVENOUS | Status: DC
Start: 2021-09-06 — End: 2021-09-07

## 2021-09-06 MED ORDER — METHOCARBAMOL 500 MG PO TABS
750.0000 mg | ORAL_TABLET | Freq: Three times a day (TID) | ORAL | Status: DC | PRN
  Filled 2021-09-06: qty 1
  Filled 2021-09-06: qty 2

## 2021-09-06 MED ORDER — INSULIN ASPART 100 UNIT/ML IJ SOLN
0.0000 [IU] | Freq: Three times a day (TID) | INTRAMUSCULAR | Status: DC
Start: 2021-09-07 — End: 2021-09-09
  Administered 2021-09-07: 18:00:00 7 [IU] via SUBCUTANEOUS
  Administered 2021-09-07 (×2): 15 [IU] via SUBCUTANEOUS
  Administered 2021-09-08 (×2): 4 [IU] via SUBCUTANEOUS
  Administered 2021-09-08: 18:00:00 11 [IU] via SUBCUTANEOUS
  Administered 2021-09-09: 09:00:00 4 [IU] via SUBCUTANEOUS
  Administered 2021-09-09: 13:00:00 7 [IU] via SUBCUTANEOUS
  Filled 2021-09-06 (×8): qty 1

## 2021-09-06 MED ORDER — FENTANYL CITRATE PF 50 MCG/ML IJ SOSY: 50.0000 ug | PREFILLED_SYRINGE | Freq: Once | INTRAMUSCULAR | Status: DC

## 2021-09-06 MED ORDER — INSULIN DETEMIR 100 UNIT/ML ~~LOC~~ SOLN
10.0000 [IU] | Freq: Every day | SUBCUTANEOUS | Status: DC
  Administered 2021-09-07: 01:00:00 10 [IU] via SUBCUTANEOUS
  Filled 2021-09-06 (×3): qty 0.1

## 2021-09-06 MED ORDER — ONDANSETRON HCL 4 MG/2ML IJ SOLN
4.0000 mg | Freq: Once | INTRAMUSCULAR | Status: AC
  Administered 2021-09-06: 10:00:00 4 mg via INTRAVENOUS
  Filled 2021-09-06: qty 2

## 2021-09-06 MED ORDER — COLCHICINE 0.6 MG PO TABS
0.6000 mg | ORAL_TABLET | Freq: Every day | ORAL | Status: DC
  Administered 2021-09-06 – 2021-09-09 (×4): 0.6 mg via ORAL
  Filled 2021-09-06 (×5): qty 1

## 2021-09-06 MED ORDER — VANCOMYCIN HCL IN DEXTROSE 1-5 GM/200ML-% IV SOLN
1000.0000 mg | Freq: Once | INTRAVENOUS | Status: AC
  Administered 2021-09-06: 19:00:00 1000 mg via INTRAVENOUS
  Filled 2021-09-06: qty 200

## 2021-09-06 MED ORDER — VANCOMYCIN HCL IN DEXTROSE 1-5 GM/200ML-% IV SOLN
1000.0000 mg | Freq: Once | INTRAVENOUS | Status: AC
  Administered 2021-09-06: 20:00:00 1000 mg via INTRAVENOUS
  Filled 2021-09-06: qty 200

## 2021-09-06 MED ORDER — ONDANSETRON HCL 4 MG/2ML IJ SOLN
4.0000 mg | Freq: Once | INTRAMUSCULAR | Status: DC
  Filled 2021-09-06: qty 2

## 2021-09-06 MED ORDER — SODIUM CHLORIDE 0.9 % IV SOLN
2.0000 g | Freq: Once | INTRAVENOUS | Status: AC
  Administered 2021-09-06: 16:00:00 2 g via INTRAVENOUS
  Filled 2021-09-06: qty 2

## 2021-09-06 MED ORDER — FENTANYL CITRATE PF 50 MCG/ML IJ SOSY
100.0000 ug | PREFILLED_SYRINGE | Freq: Once | INTRAMUSCULAR | Status: AC
  Administered 2021-09-06: 16:00:00 100 ug via INTRAVENOUS
  Filled 2021-09-06: qty 2

## 2021-09-06 MED ORDER — HYDROCODONE-ACETAMINOPHEN 5-325 MG PO TABS: 1.0000 | ORAL_TABLET | ORAL | Status: DC | PRN

## 2021-09-06 MED ORDER — NITROGLYCERIN 0.4 MG SL SUBL: 0.4000 mg | SUBLINGUAL_TABLET | SUBLINGUAL | Status: DC | PRN

## 2021-09-06 MED ORDER — SODIUM CHLORIDE 0.9 % IV SOLN
12.5000 mg | Freq: Four times a day (QID) | INTRAVENOUS | Status: DC | PRN
  Administered 2021-09-06 – 2021-09-07 (×2): 12.5 mg via INTRAVENOUS
  Filled 2021-09-06 (×2): qty 12.5

## 2021-09-06 MED ORDER — SEMAGLUTIDE (2 MG/DOSE) 8 MG/3ML ~~LOC~~ SOPN: 2.0000 mg | PEN_INJECTOR | SUBCUTANEOUS | Status: DC

## 2021-09-06 MED ORDER — INSULIN ASPART 100 UNIT/ML IJ SOLN
0.0000 [IU] | Freq: Every day | INTRAMUSCULAR | Status: DC
  Administered 2021-09-06: 22:00:00 4 [IU] via SUBCUTANEOUS
  Administered 2021-09-07: 22:00:00 2 [IU] via SUBCUTANEOUS
  Administered 2021-09-08: 21:00:00 3 [IU] via SUBCUTANEOUS
  Filled 2021-09-06 (×3): qty 1

## 2021-09-06 MED ORDER — FENTANYL CITRATE PF 50 MCG/ML IJ SOSY: 12.5000 ug | PREFILLED_SYRINGE | INTRAMUSCULAR | Status: DC | PRN

## 2021-09-06 MED ORDER — EZETIMIBE 10 MG PO TABS
10.0000 mg | ORAL_TABLET | Freq: Every day | ORAL | Status: DC
  Administered 2021-09-06 – 2021-09-09 (×4): 10 mg via ORAL
  Filled 2021-09-06 (×4): qty 1

## 2021-09-06 MED ORDER — GABAPENTIN 600 MG PO TABS
1200.0000 mg | ORAL_TABLET | Freq: Two times a day (BID) | ORAL | Status: DC
  Administered 2021-09-07 – 2021-09-09 (×4): 1200 mg via ORAL
  Filled 2021-09-06 (×6): qty 2

## 2021-09-06 MED ORDER — ROSUVASTATIN CALCIUM 10 MG PO TABS
20.0000 mg | ORAL_TABLET | Freq: Every day | ORAL | Status: DC
  Administered 2021-09-06 – 2021-09-09 (×4): 20 mg via ORAL
  Filled 2021-09-06 (×2): qty 2
  Filled 2021-09-06: qty 1
  Filled 2021-09-06: qty 2
  Filled 2021-09-06: qty 1

## 2021-09-06 MED ORDER — TRAZODONE HCL 100 MG PO TABS
200.0000 mg | ORAL_TABLET | Freq: Every day | ORAL | Status: DC
  Administered 2021-09-08: 21:00:00 200 mg via ORAL
  Filled 2021-09-06 (×3): qty 2

## 2021-09-06 MED ORDER — SODIUM CHLORIDE 0.9 % IV BOLUS
500.0000 mL | Freq: Once | INTRAVENOUS | Status: AC
  Administered 2021-09-06: 13:00:00 500 mL via INTRAVENOUS

## 2021-09-06 MED ORDER — IOHEXOL 350 MG/ML SOLN
100.0000 mL | Freq: Once | INTRAVENOUS | Status: AC | PRN
  Administered 2021-09-06: 17:00:00 100 mL via INTRAVENOUS

## 2021-09-06 MED ORDER — ACETAMINOPHEN 325 MG PO TABS
650.0000 mg | ORAL_TABLET | Freq: Four times a day (QID) | ORAL | Status: DC | PRN
  Administered 2021-09-08 – 2021-09-09 (×4): 650 mg via ORAL
  Filled 2021-09-06 (×4): qty 2

## 2021-09-06 MED ORDER — OMEGA-3-ACID ETHYL ESTERS 1 G PO CAPS
1.0000 g | ORAL_CAPSULE | Freq: Two times a day (BID) | ORAL | Status: DC
Start: 2021-09-06 — End: 2021-09-09
  Administered 2021-09-07 – 2021-09-09 (×4): 1 g via ORAL
  Filled 2021-09-06 (×9): qty 1

## 2021-09-06 MED ORDER — METRONIDAZOLE 500 MG/100ML IV SOLN
500.0000 mg | Freq: Once | INTRAVENOUS | Status: AC
  Administered 2021-09-06: 17:00:00 500 mg via INTRAVENOUS
  Filled 2021-09-06: qty 100

## 2021-09-06 MED ORDER — OXYCODONE HCL 5 MG PO TABS
5.0000 mg | ORAL_TABLET | Freq: Four times a day (QID) | ORAL | Status: DC | PRN
  Administered 2021-09-07 – 2021-09-09 (×7): 5 mg via ORAL
  Filled 2021-09-06 (×8): qty 1

## 2021-09-06 NOTE — ED Notes (Signed)
This Probation officer confirmed w/ lab that beta and sed rate can be added on.

## 2021-09-06 NOTE — ED Notes (Signed)
Respiratory notified of VBG sent to lab.

## 2021-09-06 NOTE — ED Provider Notes (Signed)
4:13 PM Assumed care for the patient at 52 PM.  60 year old male presenting with multiple symptoms including nausea vomiting diarrhea and is feeling generally unwell.  He is tachycardic here and has laboratory findings suggestive of infection including elevated procalcitonin, elevated ESR, normal white blood cell count.  Hemoglobin also dropped 2 points and has Hemoccult positive stool but it is brown.  The lactate is normal.  Patient complains of knee pain over the past 2 weeks and was diagnosed with gout at urgent care.  No history of gout.  On exam the knee is not significantly swollen or tender and he can range it without significant pain.  Overall suspicion for septic joint is low.  He was started on broad-spectrum antibiotics for sepsis.  At the time of signout he is pending additional imaging including CTA of his chest and CT abdomen pelvis and CT head to identify any other source.  Given the leg pain his shortness of breath and significantly elevated troponin there is concern for possible PE.  Managing is notable for multifocal pneumonia but negative for pulmonary embolism.  He also has what looks like left pyelonephritis on imaging.  Will admit to the hospital service.   Rada Hay, MD 09/06/21 [phone]

## 2021-09-06 NOTE — ED Notes (Signed)
Entered Pt's room to start first 1G of Vanc and found blood all over the floor and Pt's gown.  Pt had ripped out IV when he got out of bed w/o telling staff.  Floor cleaned and Pt's gown changed.   IV restarted above previous IV site.  Pt reeducated to not get out of bed w/o telling staff.  Urinal at bedside.    During interaction, Hospitalist at bedside explaining plan of care.

## 2021-09-06 NOTE — H&P (Signed)
H&P:    Brian Williams   GMW:[id number] DOB: November 30, 1960 DOA: 09/06/2021  PCP: Pleas Koch, NP  Chief Complaint: Shortness of breath, nausea, vomiting, diarrhea.   History of Present Illness:    HPI: Brian Williams is a 60 y.o. male with a past medical history of insulin-dependent diabetes mellitus type 2, obesity, ischemic cardiomyopathy status post AICD, coronary artery disease, diabetic neuropathy, sleep apnea on CPAP, hypertension, hyperlipidemia, GERD, CHF, anxiety.  This patient presents to the emergency department due to not feeling well for the past week.  Endorses significant nausea and vomiting some diarrhea.  He is a poor historian.  When he asked him details about what is making him feel unwell he does not divulge much information, states he hurts everywhere.  Did have some right knee pain that was evaluated by orthopedics outpatient and was told that he had gout. Low concern for septic joint. He is restless at bedside.  Upon my evaluation he is not altered.  Alert and oriented x4.  Very restless.  Keeps repeating that he is feeling unwell.  Nursing notes a lot of dry heaving while taking care of him.  He denies any flank pain.  He denies any urinary complaints.  He denies any chest pain.  No family present at bedside upon my evaluation.  ED Course: Extensive work-up was initiated.  Code sepsis was called.  CT abdomen pelvis showed left pyelonephritis and acute cystitis.  CT head showed no acute findings.  Duplex ultrasound was negative for DVT.  Right knee imaging showed no effusion.  Imaging also showed multifocal pneumonia.  Urinalysis was positive.  The patient was given several antibiotics including cefepime, vancomycin and Flagyl.  Antiemetics were given including Zofran and fentanyl for pain control.  The patient was given a normal saline 500 cc bolus as well.  Lab work was obtained as well as noted below.   ROS:   14 point review of systems is negative except  for what is mentioned above in the HPI.   Past Medical History:   Past Medical History:  Diagnosis Date   AICD (automatic cardioverter/defibrillator) present    Allergy    Anxiety    takes Xanax as needed   Appendicitis    Arthritis    back,knees,right shoulder   Back pain    Cardiomyopathy ([place])    CHF (congestive heart failure) (HCC)    takes Lasix and Aldactone daily   Coronary artery disease    takes Plavix daily   Depression    takes Zoloft daily   Diabetes mellitus without complication (HCC)    Humulin R and Farxiga daily.Fasting blood sugar runs 140   GERD (gastroesophageal reflux disease)    Headache    History of bronchitis    History of colon polyps    benign   History of hiatal hernia    Hyperlipidemia    takes Fenofibrate,Crestor, and Zetia daily   Hypertension    takes Entresto and Coreg daily   MI (myocardial infarction) ([place]) 2001   Obesity    Peripheral neuropathy    Pneumonia    history of-last time about 14 yrs ago   PONV (postoperative nausea and vomiting)    after knee surgery 25 yrs ago b/p stayed elevated for a while   Presence of permanent cardiac pacemaker    Shortness of breath dyspnea    with exertion   Sleep apnea    uses CPAP nightly   Ventricular tachycardia  s/p RFCA PVCs 2013    Past Surgical History:   Past Surgical History:  Procedure Laterality Date   APPENDECTOMY     BACK SURGERY     CARDIAC CATHETERIZATION     CARDIAC CATHETERIZATION Left 05/10/2016   Procedure: Left Heart Cath and Coronary Angiography;  Surgeon: Minna Merritts, MD;  Location: [facility];  Service: Cardiovascular;  Laterality: Left;   CARDIAC DEFIBRILLATOR PLACEMENT  10/16/2007   ICD Model number 2207-36 serial number [id number]   CARDIAC ELECTROPHYSIOLOGY STUDY AND ABLATION     CHOLECYSTECTOMY  2001   COLONOSCOPY     COLONOSCOPY WITH PROPOFOL N/A 08/28/2018   Procedure: COLONOSCOPY WITH PROPOFOL;  Surgeon: Jonathon Bellows, MD;  Location[facility]  ENDOSCOPY;  Service: Gastroenterology;  Laterality: N/A;   CORONARY ANGIOPLASTY WITH STENT PLACEMENT     7 stents   CORONARY ARTERY BYPASS GRAFT N/A 05/24/2016   Procedure: CORONARY ARTERY BYPASS GRAFTING (CABG) x four , using left internal mammary artery and left leg greater saphenous vein harvested endoscopically;  Surgeon: Gaye Pollack, MD;  Location: [facility];  Service: Open Heart Surgery;  Laterality: N/A;   EP IMPLANTABLE DEVICE N/A 06/16/2015   Procedure: ICD Generator Changeout;  Surgeon: Deboraha Sprang, MD;  Location: [facility];  Service: Cardiovascular;  Laterality: N/A;   ESOPHAGOGASTRODUODENOSCOPY     INSERT / REPLACE / REMOVE PACEMAKER     KNEE SURGERY     bilateral knee    [name] WITHOUT CARDIOVERSION N/A 05/24/2016   Procedure: TRANSESOPHAGEAL ECHOCARDIOGRAM ([name]);  Surgeon: Gaye Pollack, MD;  Location: [facility];  Service: Open Heart Surgery;  Laterality: N/A;   VASECTOMY      Social History:   Social History   Socioeconomic History   Marital status: Married    Spouse name: Menashe Kafer   Number of children: 2   Years of education: Not on file   Highest education level: Not on file  Occupational History   Not on file  Tobacco Use   Smoking status: Former    Packs/day: 1.00    Years: 25.00    Pack years: 25.00    Types: Cigarettes    Quit date: 05/19/2018    Years since quitting: 3.3   Smokeless tobacco: Never  Vaping Use   Vaping Use: Never used  Substance and Sexual Activity   Alcohol use: No    Comment: rare   Drug use: No   Sexual activity: Not Currently  Other Topics Concern   Not on file  Social History Narrative   Married.   Moved from [place].   Disabled.   Social Determinants of Health   Financial Resource Strain: Low Risk    Difficulty of Paying Living Expenses: Not hard at all  Food Insecurity: No Food Insecurity   Worried About Charity fundraiser in the Last Year: Never true   [place] in the Last Year: Never true   Transportation Needs: No Transportation Needs   Lack of Transportation (Medical): No   Lack of Transportation (Non-Medical): No  Physical Activity: Inactive   Days of Exercise per Week: 0 days   Minutes of Exercise per Session: 0 min  Stress: No Stress Concern Present   Feeling of Stress : Not at all  Social Connections: Socially Isolated   Frequency of Communication with Friends and Family: Never   Frequency of Social Gatherings with Friends and Family: Never   Attends Religious Services: Never  Active Member of Clubs or Organizations: No   Attends Archivist Meetings: Never   Marital Status: Married  Human resources officer Violence: Not At Risk   Fear of Current or Ex-Partner: No   Emotionally Abused: No   Physically Abused: No   Sexually Abused: No    Allergies:   Allergies  Allergen Reactions   Naproxen Other (See Comments), Anxiety and Rash    Causes hyperactivity causes hyperactivity   Morphine And Related Nausea And Vomiting    Morphine only    Family History:   Family History  Problem Relation Age of Onset   Heart attack Mother 54   Hypertension Mother    Melanoma Mother    Heart attack Father 4   Hypertension Father    Hypertension Maternal Uncle    Heart disease Maternal Uncle    Heart disease Maternal Grandmother    Stroke Maternal Grandmother    Diabetes Neg Hx      Current Medications:   Prior to Admission medications   Medication Sig Start Date End Date Taking? Authorizing Provider  aspirin EC 81 MG tablet Take 81 mg by mouth daily.  06/21/16   Minna Merritts, MD  carvedilol (COREG) 6.25 MG tablet Take 6.25 mg by mouth 2 (two) times daily. 08/09/21   [provider]  clopidogrel (PLAVIX) 75 MG tablet Take 1 tablet (75 mg total) by mouth daily. 08/25/21   Pleas Koch, NP  colchicine 0.6 MG tablet Take 0.6 mg by mouth daily. 09/04/21   [provider]  escitalopram (LEXAPRO) 20 MG tablet TAKE 1 TABLET BY MOUTH   DAILY FOR ANXIETY AND  DEPRESSION 05/09/21   Tonia Ghent, MD  ezetimibe (ZETIA) 10 MG tablet Take 1 tablet (10 mg total) by mouth daily. For cholesterol. 08/25/21   Pleas Koch, NP  furosemide (LASIX) 40 MG tablet Take 80 mg in the am & 40 mg in the pm.    [provider]  gabapentin (NEURONTIN) 600 MG tablet TAKE TWO TABLETS BY MOUTH TWICE A DAY FOR PAIN 05/28/21   Pleas Koch, NP  gemfibrozil (LOPID) 600 MG tablet TAKE 1 TABLET( 600MG  TOTAL) BY MOUTH DAILY FOR CHOLESTEROL 08/25/21   Pleas Koch, NP  insulin regular human CONCENTRATED (HUMULIN R) 500 UNIT/ML injection Inject 0-25 Units into the skin daily at 12 noon.    [provider]  methocarbamol (ROBAXIN) 750 MG tablet Take 1 tablet (750 mg total) by mouth every 8 (eight) hours as needed for muscle spasms. 06/22/21   Gillis Santa, MD  metoprolol succinate (TOPROL-XL) 50 MG 24 hr tablet Take 50 mg by mouth daily. 03/14/21   [provider]  nitroGLYCERIN (NITROSTAT) 0.4 MG SL tablet Place 1 tablet (0.4 mg total) under the tongue every 5 (five) minutes as needed for chest pain. 04/28/21   Pleas Koch, NP  omega-3 acid ethyl esters (LOVAZA) 1 g capsule Take 2 capsules by mouth 2 (two) times daily. 03/22/21   [provider]  oxyCODONE (OXY IR/ROXICODONE) 5 MG immediate release tablet Take 1 tablet (5 mg total) by mouth every 6 (six) hours as needed for severe pain. 08/28/21   Johnn Hai, PA-C  rosuvastatin (CRESTOR) 20 MG tablet TAKE 1 TABLET BY MOUTH AT  BEDTIME FOR CHOLESTEROL 08/15/21   Pleas Koch, NP  sacubitril-valsartan (ENTRESTO) 97-103 MG Take 0.5 tablets by mouth 2 (two) times daily. 01/18/21 01/18/22  [provider]  Semaglutide, 2 MG/DOSE, (OZEMPIC, 2  MG/DOSE,) 8 MG/3ML SOPN Inject 2 mg into the skin once a week.    [provider]  sildenafil (REVATIO) 20 MG tablet Take 2 to 5 tablets by mouth 1 hour prior to intercourse as needed. 11/15/20   Pleas Koch, NP  spironolactone (ALDACTONE) 50 MG tablet Take 25 mg by mouth daily. 1/2 tab bid    [provider]  traZODone (DESYREL) 100 MG tablet TAKE 2 TABLETS BY MOUTH  EVERY NIGHT AT BEDTIME for sleep. 08/09/21   Pleas Koch, NP     Physical Exam:   Vitals:   09/06/21 1430 09/06/21 1550 09/06/21 1630 09/06/21 1800  BP: 113/63 129/68 116/75 129/69  Pulse: 100 97 94 95  Resp: 15 (!) 28 (!) 36 20  Temp:      TempSrc:      SpO2: 97% 94% 94% 97%  Weight:      Height:         General:  Appears calm and comfortable and is in NAD Cardiovascular:  RRR, no m/r/g.  Respiratory:   CTA bilaterally with no wheezes/rales/rhonchi.  Normal respiratory effort. Abdomen:  soft, NT, ND, NABS Skin:  no rash or induration seen on limited exam Musculoskeletal:  grossly normal tone BUE/BLE, good ROM, no bony abnormality Lower extremity:  No LE edema.  Limited foot exam with no ulcerations.  2+ distal pulses. Psychiatric:  grossly normal mood and affect, speech fluent and appropriate, AOx3 Neurologic:  CN 2-12 grossly intact, moves all extremities in coordinated fashion, sensation intact    Data Review:    Radiological Exams on Admission: Independently reviewed - see discussion in A/P where applicable  DG Chest 2 View  Result Date: 09/06/2021 CLINICAL DATA:  Shortness of breath EXAM: CHEST - 2 VIEW COMPARISON:  Chest x-ray dated December 06, 2020 FINDINGS: Cardiac and mediastinal contours are unchanged post median sternotomy. Left chest wall cardiac conduction device with unchanged lead position. Mild bilateral heterogeneous opacities. No large pleural effusion or pneumothorax. IMPRESSION: Mild bilateral heterogeneous opacities, possibly due to pulmonary edema or atelectasis. Electronically Signed   By: Yetta Glassman M.D.   On: 09/06/2021 15:40   CT HEAD WO CONTRAST (5MM)  Result Date: 09/06/2021 CLINICAL DATA:  Altered mental status, nausea/vomiting EXAM: CT HEAD WITHOUT  CONTRAST TECHNIQUE: Contiguous axial images were obtained from the base of the skull through the vertex without intravenous contrast. COMPARISON:  11/09/2015 FINDINGS: Brain: No evidence of acute infarction, hemorrhage, hydrocephalus, extra-axial collection or mass lesion/mass effect. Mild subcortical white matter and periventricular small vessel ischemic changes. Vascular: Intracranial atherosclerosis. Skull: Normal. Negative for fracture or focal lesion. Sinuses/Orbits: The visualized paranasal sinuses are essentially clear. The mastoid air cells are unopacified. Other: None. IMPRESSION: No evidence of acute intracranial abnormality. Mild small vessel ischemic changes. Electronically Signed   By: Julian Hy M.D.   On: 09/06/2021 17:20   CT Angio Chest PE W and/or Wo Contrast  Result Date: 09/06/2021 CLINICAL DATA:  Shortness of breath, evaluate for PE. Right lower quadrant pain. Nausea/vomiting x1 week. EXAM: CT ANGIOGRAPHY CHEST CT ABDOMEN AND PELVIS WITH CONTRAST TECHNIQUE: Multidetector CT imaging of the chest was performed using the standard protocol during bolus administration of intravenous contrast. Multiplanar CT image reconstructions and MIPs were obtained to evaluate the vascular anatomy. Multidetector CT imaging of the abdomen and pelvis was performed using the standard protocol during bolus administration of intravenous contrast. CONTRAST:  1106mL OMNIPAQUE IOHEXOL 350 MG/ML SOLN COMPARISON:  Chest radiographs dated 09/06/2021. CT abdomen/pelvis  dated 04/03/2021. FINDINGS: CTA CHEST FINDINGS Cardiovascular: Satisfactory opacification of the bilateral pulmonary arteries to the segmental level. No evidence of pulmonary embolism. Although not tailored for evaluation of the thoracic aorta, there is no evidence of thoracic aneurysm or dissection. Atherosclerotic calcification of the aortic arch Cardiomegaly.  No pericardial effusion. Three vessel coronary atherosclerosis. Postsurgical changes  related to prior CABG. Left subclavian ICD. Mediastinum/Nodes: No suspicious mediastinal lymphadenopathy. Visualized thyroid is unremarkable. Lungs/Pleura: Mild patchy opacities in the lungs bilaterally, upper lobe predominant, favoring multifocal pneumonia. No suspicious pulmonary nodules. No pleural effusions or pneumothorax. Musculoskeletal: Mild degenerative changes of the mid/lower thoracic spine. Median sternotomy. Review of the MIP images confirms the above findings. CT ABDOMEN and PELVIS FINDINGS Hepatobiliary: Liver is notable for scattered subcentimeter cysts. Status post cholecystectomy. No intrahepatic or extrahepatic dilatation. Pancreas: Within normal limits. Spleen: Within normal limits. Adrenals/Urinary Tract: Adrenal glands are within normal limits. Heterogeneous enhancement of the posterior interpolar left kidney (series 5/image 45), masslike but new, favoring focal pyelonephritis. Right kidney is within normal limits. No hydronephrosis. Mildly thick-walled bladder, although underdistended. Stomach/Bowel: Stomach is within normal limits. No evidence of bowel obstruction.  Duodenal diverticulum. Status post appendectomy. No colonic wall thickening or inflammatory changes. Vascular/Lymphatic: No evidence of abdominal aortic aneurysm. Atherosclerotic calcifications of the abdominal aorta and branch vessels. No suspicious abdominopelvic lymphadenopathy. Reproductive: Prostate is unremarkable, noting dystrophic calcifications. Other: No abdominopelvic ascites. Musculoskeletal: Mild degenerative changes of the lumbar spine. Review of the MIP images confirms the above findings. IMPRESSION: No evidence of pulmonary embolism. Multifocal pneumonia, upper lobe predominant. Left renal pyelonephritis. Mildly thick-walled bladder, correlate for cystitis. Prior appendectomy. Electronically Signed   By: Julian Hy M.D.   On: 09/06/2021 17:27   CT ABDOMEN PELVIS W CONTRAST  Result Date:  09/06/2021 CLINICAL DATA:  Shortness of breath, evaluate for PE. Right lower quadrant pain. Nausea/vomiting x1 week. EXAM: CT ANGIOGRAPHY CHEST CT ABDOMEN AND PELVIS WITH CONTRAST TECHNIQUE: Multidetector CT imaging of the chest was performed using the standard protocol during bolus administration of intravenous contrast. Multiplanar CT image reconstructions and MIPs were obtained to evaluate the vascular anatomy. Multidetector CT imaging of the abdomen and pelvis was performed using the standard protocol during bolus administration of intravenous contrast. CONTRAST:  128mL OMNIPAQUE IOHEXOL 350 MG/ML SOLN COMPARISON:  Chest radiographs dated 09/06/2021. CT abdomen/pelvis dated 04/03/2021. FINDINGS: CTA CHEST FINDINGS Cardiovascular: Satisfactory opacification of the bilateral pulmonary arteries to the segmental level. No evidence of pulmonary embolism. Although not tailored for evaluation of the thoracic aorta, there is no evidence of thoracic aneurysm or dissection. Atherosclerotic calcification of the aortic arch Cardiomegaly.  No pericardial effusion. Three vessel coronary atherosclerosis. Postsurgical changes related to prior CABG. Left subclavian ICD. Mediastinum/Nodes: No suspicious mediastinal lymphadenopathy. Visualized thyroid is unremarkable. Lungs/Pleura: Mild patchy opacities in the lungs bilaterally, upper lobe predominant, favoring multifocal pneumonia. No suspicious pulmonary nodules. No pleural effusions or pneumothorax. Musculoskeletal: Mild degenerative changes of the mid/lower thoracic spine. Median sternotomy. Review of the MIP images confirms the above findings. CT ABDOMEN and PELVIS FINDINGS Hepatobiliary: Liver is notable for scattered subcentimeter cysts. Status post cholecystectomy. No intrahepatic or extrahepatic dilatation. Pancreas: Within normal limits. Spleen: Within normal limits. Adrenals/Urinary Tract: Adrenal glands are within normal limits. Heterogeneous enhancement of the  posterior interpolar left kidney (series 5/image 45), masslike but new, favoring focal pyelonephritis. Right kidney is within normal limits. No hydronephrosis. Mildly thick-walled bladder, although underdistended. Stomach/Bowel: Stomach is within normal limits. No evidence of bowel obstruction.  Duodenal diverticulum. Status post  appendectomy. No colonic wall thickening or inflammatory changes. Vascular/Lymphatic: No evidence of abdominal aortic aneurysm. Atherosclerotic calcifications of the abdominal aorta and branch vessels. No suspicious abdominopelvic lymphadenopathy. Reproductive: Prostate is unremarkable, noting dystrophic calcifications. Other: No abdominopelvic ascites. Musculoskeletal: Mild degenerative changes of the lumbar spine. Review of the MIP images confirms the above findings. IMPRESSION: No evidence of pulmonary embolism. Multifocal pneumonia, upper lobe predominant. Left renal pyelonephritis. Mildly thick-walled bladder, correlate for cystitis. Prior appendectomy. Electronically Signed   By: Julian Hy M.D.   On: 09/06/2021 17:27   US Venous Img Lower Unilateral Right (DVT)  Result Date: 09/06/2021 CLINICAL DATA:  Right knee pain. EXAM: RIGHT LOWER EXTREMITY VENOUS DOPPLER ULTRASOUND TECHNIQUE: Gray-scale sonography with graded compression, as well as color Doppler and duplex ultrasound were performed to evaluate the lower extremity deep venous systems from the level of the common femoral vein and including the common femoral, femoral, profunda femoral, popliteal and calf veins including the posterior tibial, peroneal and gastrocnemius veins when visible. The superficial great saphenous vein was also interrogated. Spectral Doppler was utilized to evaluate flow at rest and with distal augmentation maneuvers in the common femoral, femoral and popliteal veins. COMPARISON:  None. FINDINGS: Contralateral Common Femoral Vein: Respiratory phasicity is normal and symmetric with the  symptomatic side. No evidence of thrombus. Normal compressibility. Common Femoral Vein: No evidence of thrombus. Normal compressibility, respiratory phasicity and response to augmentation. Saphenofemoral Junction: No evidence of thrombus. Normal compressibility and flow on color Doppler imaging. Profunda Femoral Vein: No evidence of thrombus. Normal compressibility and flow on color Doppler imaging. Femoral Vein: No evidence of thrombus. Normal compressibility, respiratory phasicity and response to augmentation. Popliteal Vein: No evidence of thrombus. Normal compressibility, respiratory phasicity and response to augmentation. Calf Veins: No evidence of thrombus. Normal compressibility and flow on color Doppler imaging. Superficial Great Saphenous Vein: No evidence of thrombus. Normal compressibility. Venous Reflux:  None. Other Findings: No evidence of superficial thrombophlebitis or abnormal fluid collection. IMPRESSION: No evidence of right lower extremity deep venous thrombosis. Electronically Signed   By: Aletta Edouard M.D.   On: 09/06/2021 16:44   DG Knee Complete 4 Views Right  Result Date: 09/06/2021 CLINICAL DATA:  A 60 year old male presents for evaluation of knee pain. EXAM: RIGHT KNEE - COMPLETE 4+ VIEW COMPARISON:  August 28, 2021. FINDINGS: Tricompartmental osteoarthritic changes greatest in the lateral and patellofemoral compartments are similar to recent imaging. Osteopenia. No signs of fracture or dislocation. Soft tissues are unremarkable. No joint effusion. IMPRESSION: Similar appearance of tricompartmental osteoarthritic changes, no acute findings. Electronically Signed   By: Zetta Bills M.D.   On: 09/06/2021 15:41    EKG: Independently reviewed.  Sinus tach, 110 BPM   Labs on Admission: I have personally reviewed the available labs and imaging studies at the time of the admission.  Pertinent labs on Admission: Sodium 124, corrected sodium 130, chloride 94, bicarb 18, BUN 27,  blood glucose 365, anion gap 12, beta hydroxybutyrate 0.1, initial troponin 377, repeat troponin 294, BNP 370, Pro-Cal 5, sed rate 106; VBG pH 7.45, PCO2 28, PO2 131, hemoglobin 9.5, hematocrit 26, platelets 134     Assessment/Plan:    Severe sepsis secondary to acute cystitis/pyelonephritis and multifocal pneumonia: With concern for bacteremia.  Code sepsis called in the emergency department.  The patient has been given cefepime 2 g IV, Vancomycin 2 g, and Flagyl 500 mg in the emergency department.  We will start Rocephin 2 g IV daily.  Procalcitonin is elevated and  there is concern for bacteremia.  Blood cultures and urine cultures are pending results.  Urinalysis is floridly positive.  CT imaging demonstrates left pyelonephritis and acute cystitis.  Start Zithromax for multifocal pneumonia. On room air.  Intractable nausea: likely secondary above.  States Zofran has not helped much.  Start Phenergan IV as needed.  Clear liquid diet.  Advance as tolerated.  Elevated troponins: Likely secondary to demand ischemia.  Denies any chest pain.  Trend troponins.  Obtain echocardiogram to look for wall motion abnormalities.  Consider cardiology consult.  Hemoccult positive: Brown stool.  Monitor H&H.  Transfuse if hemoglobin drops less than 8.  Consider inpatient gastroenterology consult.  Holding any anticoagulation.  Ischemic cardiomyopathy/CHF: Euvolemic. Continue home Entresto, Aldactone, Toprol-XL.  The patient also according to his med rec takes carvedilol.  I have held this until it is confirmed which beta-blocker he is taking.  Diabetic neuropathy: Continue home gabapentin  Dyslipidemia: Continue home Zetia, Lopid, Lovaza, Crestor  Insomnia: Continue home trazodone  Insulin-dependent diabetes mellitus type 2: Repeat A1c ordered.  Continue home Ozempic.  Holding home Humulin R.  Start Lantus 10 units nightly and sliding scale insulin with Accu-Cheks before meals and at bedtime. No evidence of  DKA  Depression: Continue home Lexapro  Gout: Continue home colchicine. No evidence of septic joint in right knee.  Coronary artery disease: Continue home aspirin and Plavix  OSA: CPAP ordered    Other information:    Level of Care: Med telemetry DVT prophylaxis: SCDs Code Status: Full code Consults: None Admission status: Inpatient   Leslee Home DO [facility]   How to contact the [facility] Attending or Consulting provider [place] or covering provider during after hours [place], for this patient?  Check the care team in [facility] and look for a) attending/consulting [facility] provider listed and b) the [facility] team listed Log into [email or web address] and use [place]'s universal password to access. If you do not have the password, please contact the hospital operator. Locate the [facility] provider you are looking for under [facility] and page to a number that you can be directly reached. If you still have difficulty reaching the provider, please page the [facility] [facility] (Director on Call) for the Hospitalists listed on amion for assistance.   09/06/2021, 6:26 PM

## 2021-09-06 NOTE — Consult Note (Signed)
PHARMACY -  BRIEF ANTIBIOTIC NOTE   Pharmacy has received consult(s) for Vancomycin and Cefepime from an ED provider.  The patient's profile has been reviewed for ht/wt/allergies/indication/available labs.    One time order(s) placed for Vancomycin 2000mg  + Cefepime 2gm  Further antibiotics/pharmacy consults should be ordered by admitting physician if indicated.                       Thank you, Carlisle Torgeson Rodriguez-Guzman PharmD, BCPS 09/06/2021 2:58 PM

## 2021-09-06 NOTE — ED Provider Notes (Signed)
[facility] Emergency Department Provider Note  ____________________________________________   Event Date/Time   First MD Initiated Contact with Patient 09/06/21 1255     (approximate)  I have reviewed the triage vital signs and the nursing notes.   HISTORY  Chief Complaint Knee Pain and Emesis    HPI EXCELL Brian Williams is a 60 y.o. male with CHF, ICD, diabetes, CHF who comes in with concerns for "feeling sick".  Patient states that he has had nausea, vomiting, diarrhea, coughing, shortness of breath for the past week.  He states that he just feels very unwell.  He denies any sick contacts.  Does report having his COVID vaccines.  Denies any abdominal tenderness.  He denies any syncopal episodes or his ICD firing.  Denies any falls, hitting his head.  No new leg swelling.  He does report some chronic issues with his right knee and was just diagnosed with gout a week ago.  They did not draw any fluid off.  He reports continued pain on this.  He has been taking hydrocodone and naproxen for this.  Patient reports has not been taking his diabetes medicine secondary to just not feeling well.          Past Medical History:  Diagnosis Date   AICD (automatic cardioverter/defibrillator) present    Allergy    Anxiety    takes Xanax as needed   Appendicitis    Arthritis    back,knees,right shoulder   Back pain    Cardiomyopathy ([place])    CHF (congestive heart failure) (HCC)    takes Lasix and Aldactone daily   Coronary artery disease    takes Plavix daily   Depression    takes Zoloft daily   Diabetes mellitus without complication (HCC)    Humulin R and Farxiga daily.Fasting blood sugar runs 140   GERD (gastroesophageal reflux disease)    Headache    History of bronchitis    History of colon polyps    benign   History of hiatal hernia    Hyperlipidemia    takes Fenofibrate,Crestor, and Zetia daily   Hypertension    takes Entresto and Coreg daily   MI  (myocardial infarction) ([place]) 2001   Obesity    Peripheral neuropathy    Pneumonia    history of-last time about 14 yrs ago   PONV (postoperative nausea and vomiting)    after knee surgery 25 yrs ago b/p stayed elevated for a while   Presence of permanent cardiac pacemaker    Shortness of breath dyspnea    with exertion   Sleep apnea    uses CPAP nightly   Ventricular tachycardia    s/p RFCA PVCs 2013    Patient Active Problem List   Diagnosis Date Noted   Viral illness 07/13/2021   Lumbar degenerative disc disease 05/11/2021   Lumbar facet arthropathy 05/11/2021   Lumbar post-laminectomy syndrome 05/11/2021   Chronic pain syndrome 05/11/2021   Chronic SI joint pain 05/11/2021   Low back pain 05/10/2021   Acute appendicitis 04/03/2021   Depression 04/03/2021   Diarrhea 12/06/2020   Hypotension 12/06/2020   Tingling 12/01/2020   Vision changes 12/01/2020   Dizziness 11/23/2020   Sinusitis, acute 08/08/2020   Diabetic neuropathy associated with type 2 diabetes mellitus (Brian Williams) 08/05/2020   Falls, initial encounter 08/05/2020   Leg pain, bilateral 08/05/2020   Sensory ataxia 08/05/2020   Chronic knee pain 07/25/2020   Myalgia 01/28/2019   Major depressive disorder 07/08/2017  Erectile dysfunction 07/08/2017   Hx of CABG 07/26/2016   Coronary artery disease 05/24/2016   Atherosclerosis of native coronary artery of native heart with stable angina pectoris (Brian Williams)    History of coronary artery stent placement    Unstable angina ([place]) 05/04/2016   Obesity (BMI 30-39.9) 04/06/2016   OSA on CPAP 01/06/2016   Diabetes, polyneuropathy (Brian Williams) 01/06/2016   Hidradenitis suppurativa 10/06/2015   Chronic back pain 05/02/2015   Essential hypertension 05/02/2015   Hyperlipidemia 05/02/2015   Generalized anxiety disorder 05/02/2015   ICD (implantable cardioverter-defibrillator) in place 04/20/2015   Congestive dilated cardiomyopathy ([place]) 04/20/2015   Type 2 diabetes mellitus with  complications (Brian Williams) 87/86/7672   Chronic systolic heart failure (Brian Williams) 09/30/2014    Past Surgical History:  Procedure Laterality Date   APPENDECTOMY     BACK SURGERY     CARDIAC CATHETERIZATION     CARDIAC CATHETERIZATION Left 05/10/2016   Procedure: Left Heart Cath and Coronary Angiography;  Surgeon: Minna Merritts, MD;  Location: [facility];  Service: Cardiovascular;  Laterality: Left;   CARDIAC DEFIBRILLATOR PLACEMENT  10/16/2007   ICD Model number 2207-36 serial number [id number]   CARDIAC ELECTROPHYSIOLOGY STUDY AND ABLATION     CHOLECYSTECTOMY  2001   COLONOSCOPY     COLONOSCOPY WITH PROPOFOL N/A 08/28/2018   Procedure: COLONOSCOPY WITH PROPOFOL;  Surgeon: Jonathon Bellows, MD;  Location: [facility] ENDOSCOPY;  Service: Gastroenterology;  Laterality: N/A;   CORONARY ANGIOPLASTY WITH STENT PLACEMENT     7 stents   CORONARY ARTERY BYPASS GRAFT N/A 05/24/2016   Procedure: CORONARY ARTERY BYPASS GRAFTING (CABG) x four , using left internal mammary artery and left leg greater saphenous vein harvested endoscopically;  Surgeon: Gaye Pollack, MD;  Location: [place];  Service: Open Heart Surgery;  Laterality: N/A;   EP IMPLANTABLE DEVICE N/A 06/16/2015   Procedure: ICD Generator Changeout;  Surgeon: Deboraha Sprang, MD;  Location: [place] [facility];  Service: Cardiovascular;  Laterality: N/A;   ESOPHAGOGASTRODUODENOSCOPY     INSERT / REPLACE / REMOVE PACEMAKER     KNEE SURGERY     bilateral knee    [name] WITHOUT CARDIOVERSION N/A 05/24/2016   Procedure: TRANSESOPHAGEAL ECHOCARDIOGRAM ([name]);  Surgeon: Gaye Pollack, MD;  Location: [place];  Service: Open Heart Surgery;  Laterality: N/A;   VASECTOMY      Prior to Admission medications   Medication Sig Start Date End Date Taking? Authorizing Provider  aspirin EC 81 MG tablet Take 81 mg by mouth daily.  06/21/16   Minna Merritts, MD  clopidogrel (PLAVIX) 75 MG tablet Take 1 tablet (75 mg total) by mouth daily. 08/25/21   Pleas Koch,  NP  escitalopram (LEXAPRO) 20 MG tablet TAKE 1 TABLET BY MOUTH  DAILY FOR ANXIETY AND  DEPRESSION 05/09/21   Tonia Ghent, MD  ezetimibe (ZETIA) 10 MG tablet Take 1 tablet (10 mg total) by mouth daily. For cholesterol. 08/25/21   Pleas Koch, NP  furosemide (LASIX) 40 MG tablet Take 80 mg in the am & 40 mg in the pm.    [provider]  gabapentin (NEURONTIN) 600 MG tablet TAKE TWO TABLETS BY MOUTH TWICE A DAY FOR PAIN 05/28/21   Pleas Koch, NP  gemfibrozil (LOPID) 600 MG tablet TAKE 1 TABLET( 600MG  TOTAL) BY MOUTH DAILY FOR CHOLESTEROL 08/25/21   Pleas Koch, NP  insulin regular human CONCENTRATED (HUMULIN R) 500 UNIT/ML injection Inject 0-25 Units into the skin daily  at 12 noon.    [provider]  methocarbamol (ROBAXIN) 750 MG tablet Take 1 tablet (750 mg total) by mouth every 8 (eight) hours as needed for muscle spasms. 06/22/21   Gillis Santa, MD  metoprolol succinate (TOPROL-XL) 50 MG 24 hr tablet Take 50 mg by mouth daily. 03/14/21   [provider]  nitroGLYCERIN (NITROSTAT) 0.4 MG SL tablet Place 1 tablet (0.4 mg total) under the tongue every 5 (five) minutes as needed for chest pain. 04/28/21   Pleas Koch, NP  omega-3 acid ethyl esters (LOVAZA) 1 g capsule Take 2 capsules by mouth 2 (two) times daily. 03/22/21   [provider]  oxyCODONE (OXY IR/ROXICODONE) 5 MG immediate release tablet Take 1 tablet (5 mg total) by mouth every 6 (six) hours as needed for severe pain. 08/28/21   Johnn Hai, PA-C  rosuvastatin (CRESTOR) 20 MG tablet TAKE 1 TABLET BY MOUTH AT  BEDTIME FOR CHOLESTEROL 08/15/21   Pleas Koch, NP  sacubitril-valsartan (ENTRESTO) 97-103 MG Take 0.5 tablets by mouth 2 (two) times daily. 01/18/21 01/18/22  [provider]  Semaglutide, 2 MG/DOSE, (OZEMPIC, 2 MG/DOSE,) 8 MG/3ML SOPN Inject 2 mg into the skin once a week.    [provider]  sildenafil (REVATIO) 20 MG tablet Take 2 to 5 tablets  by mouth 1 hour prior to intercourse as needed. 11/15/20   Pleas Koch, NP  spironolactone (ALDACTONE) 50 MG tablet Take 25 mg by mouth daily. 1/2 tab bid    [provider]  traZODone (DESYREL) 100 MG tablet TAKE 2 TABLETS BY MOUTH  EVERY NIGHT AT BEDTIME for sleep. 08/09/21   Pleas Koch, NP    Allergies Naproxen and Morphine and related  Family History  Problem Relation Age of Onset   Heart attack Mother 28   Hypertension Mother    Melanoma Mother    Heart attack Father 78   Hypertension Father    Hypertension Maternal Uncle    Heart disease Maternal Uncle    Heart disease Maternal Grandmother    Stroke Maternal Grandmother    Diabetes Neg Hx     Social History Social History   Tobacco Use   Smoking status: Former    Packs/day: 1.00    Years: 25.00    Pack years: 25.00    Types: Cigarettes    Quit date: 05/19/2018    Years since quitting: 3.3   Smokeless tobacco: Never  Vaping Use   Vaping Use: Never used  Substance Use Topics   Alcohol use: No    Comment: rare   Drug use: No      Review of Systems Constitutional: No fever/chills Eyes: No visual changes. ENT: No sore throat. Cardiovascular: Denies chest pain. Respiratory: Shortness of breath Gastrointestinal: No abdominal pain.  Positive nausea, vomiting, diarrhea Genitourinary: Negative for dysuria. Musculoskeletal: Negative for back pain.  Knee pain Skin: Negative for rash. Neurological: Negative for headaches, focal weakness or numbness. All other ROS negative ____________________________________________   PHYSICAL EXAM:  VITAL SIGNS: ED Triage Vitals  Enc Vitals Group     BP 09/06/21 0925 128/73     Pulse Rate 09/06/21 0925 (!) 106     Resp 09/06/21 0925 20     Temp 09/06/21 0925 98.3 F (36.8 C)     Temp Source 09/06/21 0925 Oral     SpO2 09/06/21 0925 95 %     Weight 09/06/21 0923 270 lb (122.5 kg)     Height  09/06/21 0923 6' (1.829 m)     Head Circumference --       Peak Flow --      Pain Score 09/06/21 0923 10     Pain Loc --      Pain Edu? --      Excl. in [place]? --     Constitutional: Alert and oriented. Well appearing and in no acute distress. Eyes: Conjunctivae are normal. EOMI. Head: Atraumatic. Nose: No congestion/rhinnorhea. Mouth/Throat: Mucous membranes are moist.   Neck: No stridor. Trachea Midline. FROM Cardiovascular: Normal rate, regular rhythm. Grossly normal heart sounds.  Good peripheral circulation.  ICD palpated on left chest wall Respiratory: Normal respiratory effort.  No retractions. Lungs CTAB. Gastrointestinal: Soft and nontender. No distention. No abdominal bruits.  Musculoskeletal: No lower extremity tenderness nor edema.  No joint effusions.  Patient reports pain in the right knee without any redness or swelling noted. Neurologic:  Normal speech and language. No gross focal neurologic deficits are appreciated.  Skin:  Skin is warm, dry and intact. No rash noted. Psychiatric: Mood and affect are normal. Speech and behavior are normal. GU: brown stool heme occult positive   ____________________________________________   LABS (all labs ordered are listed, but only abnormal results are displayed)  Labs Reviewed  COMPREHENSIVE METABOLIC PANEL - Abnormal; Notable for the following components:      Result Value   Sodium 124 (*)    Chloride 94 (*)    CO2 18 (*)    Glucose, Bld 365 (*)    BUN 27 (*)    All other components within normal limits  CBC WITH DIFFERENTIAL/PLATELET - Abnormal; Notable for the following components:   RBC 2.97 (*)    Hemoglobin 9.5 (*)    HCT 26.1 (*)    MCHC 36.4 (*)    Platelets 134 (*)    All other components within normal limits  URINALYSIS, ROUTINE W REFLEX MICROSCOPIC - Abnormal; Notable for the following components:   APPearance CLEAR (*)    Glucose, UA >1,000 (*)    Hgb urine dipstick MODERATE (*)    Ketones, ur 15 (*)    Protein, ur 30 (*)    Bacteria, UA MANY (*)    All other  components within normal limits  CBG MONITORING, ED - Abnormal; Notable for the following components:   Glucose-Capillary 384 (*)    All other components within normal limits  CULTURE, BLOOD (SINGLE)  LACTIC ACID, PLASMA  LACTIC ACID, PLASMA   ____________________________________________   ED ECG REPORT I, Vanessa [place], the attending physician, personally viewed and interpreted this ECG.  Sinus tachycardia rate of 101, no ST elevation, T wave version in the inferior lateral leads, type I AV block.  Reviewed prior EKGs and has similar T wave inversions ____________________________________________  RADIOLOGY Robert Bellow, personally viewed and evaluated these images (plain radiographs) as part of my medical decision making, as well as reviewing the written report by the radiologist.  ED MD interpretation:  pendiing  Official radiology report(s): No results found.  ____________________________________________   PROCEDURES  Procedure(s) performed (including Critical Care):  .1-3 Lead EKG Interpretation Performed by: Vanessa [place], MD Authorized by: Vanessa [place], MD     Interpretation: abnormal     ECG rate:  110   ECG rate assessment: tachycardic     Rhythm: sinus tachycardia     Ectopy: none     Conduction: normal   .Critical Care Performed by: Vanessa [place], MD  Authorized by: Vanessa [place], MD   Critical care provider statement:    Critical care time (minutes):  30   Critical care was necessary to treat or prevent imminent or life-threatening deterioration of the following conditions:  Sepsis   Critical care was time spent personally by me on the following activities:  Development of treatment plan with patient or surrogate, discussions with consultants, evaluation of patient's response to treatment, examination of patient, ordering and review of laboratory studies, ordering and review of radiographic studies, ordering and performing treatments and interventions,  pulse oximetry, re-evaluation of patient's condition and review of old charts   ____________________________________________   INITIAL IMPRESSION / ASSESSMENT AND PLAN / ED COURSE  Brian Williams was evaluated in Emergency Department on 09/06/2021 for the symptoms described in the history of present illness. He was evaluated in the context of the global COVID-19 pandemic, which necessitated consideration that the patient might be at risk for infection with the SARS-CoV-2 virus that causes COVID-19. Institutional protocols and algorithms that pertain to the evaluation of patients at risk for COVID-19 are in a state of rapid change based on information released by regulatory bodies including the CDC and federal and state organizations. These policies and algorithms were followed during the patient's care in the ED.    Patient comes in with multiple symptoms.  This sounds concerning for flu, COVID, pneumonia.  Abdomen is soft and nontender low suspicion for acute abdominal pathology.  Patient's right knee has no evidence of cellulitis on it.  He is being treated for gout.  He not see any obvious effusion.  We will get x-ray to see if there is any effusion.  Labs did show elevated glucose with a bicarb but slightly low.  We will get a VBG, beta to evaluate for DKA although anion gap is normal.  We will give some gentle fluid hydration and some Zofran to help with symptoms. Doubt meningitis given no neck stiffness.    Labs show elevated glucose but no evidence of DKA.  pH is normal.  Corrected sodium is 130.  Patient getting some mild IV fluids.  His COVID, flu are negative.  His procalcitonin is significantly high.  May be from a possible UTI.  His troponin is also elevated  Patient's hemoglobin did slightly drop.  To do a rectal exam with brown stool but Hemoccult positive.  We will hold off on heparin for now until we continue to trend it out.  His sed rate and his procalcitonin are elevated making me  concerned of a potential bacterial infection.  Patient is a poor story and so we will get a CT scan to further evaluate for any sign of PE, abdominal infection, pneumonia etc.  I will also get a DVT ultrasound of the right leg.  He reports pain in the right knee but he has a very unimpressive exam without any redness and he is able to range it without any significant pain.  There is no other source consider septic knee but at this time I do not feel like there is any effusion to even be able to tap.  Patient was seen with the oncoming doctor who will follow-up on CT imaging, admission, and possible trial to tap knee if no other sources.           ____________________________________________   FINAL CLINICAL IMPRESSION(S) / ED DIAGNOSES   Final diagnoses:  Sepsis, due to unspecified organism, unspecified whether acute organ dysfunction present ([place])  MEDICATIONS GIVEN DURING THIS VISIT:  Medications  fentaNYL (SUBLIMAZE) injection 100 mcg (has no administration in time range)  ceFEPIme (MAXIPIME) 2 g in sodium chloride 0.9 % 100 mL IVPB (has no administration in time range)  metroNIDAZOLE (FLAGYL) IVPB 500 mg (has no administration in time range)  vancomycin (VANCOCIN) IVPB 1000 mg/200 mL premix (has no administration in time range)  vancomycin (VANCOCIN) IVPB 1000 mg/200 mL premix (has no administration in time range)  ondansetron (ZOFRAN) injection 4 mg (4 mg Intravenous Given 09/06/21 0952)  sodium chloride 0.9 % bolus 500 mL (500 mLs Intravenous New Bag/Given 09/06/21 1320)  ondansetron (ZOFRAN) injection 4 mg (4 mg Intravenous Given 09/06/21 1320)     ED Discharge Orders     None        Note:  This document was prepared using Dragon voice recognition software and may include unintentional dictation errors.    Vanessa [place], MD 09/06/21 [phone]

## 2021-09-06 NOTE — Progress Notes (Signed)
Patient attempted, but was unable to tolerate hospital unit. Unit remains at bedside at this time.

## 2021-09-06 NOTE — ED Notes (Signed)
US at bedside

## 2021-09-06 NOTE — ED Triage Notes (Signed)
Pt c/o right knee pain and was dx with Gout a week ago, pt c/o N/V for the past week. No redness or swelling to the knee, pt has been taking IBU, naproxen, hydrocodone, wife reports pt has been taking the medication on an empty stomach,

## 2021-09-06 NOTE — Progress Notes (Signed)
PHARMACY - PHYSICIAN COMMUNICATION CRITICAL VALUE ALERT - BLOOD CULTURE IDENTIFICATION (BCID)  Brian Williams is an 60 y.o. male who presented to [facility] on [facility]/06[facility]2021 with a chief complaint of Shortness of breath, nausea, vomiting, diarrhea  Assessment:  1/4 (anerobic) GNR: BCID =  Klebsiella pneumoniae suspect 2/2 urinary > pulmonary source   Name of physician (or Provider) Contacted: Sharion Settler  Current antibiotics: Ceftriaxone 2 Gram Q24H  Changes to prescribed antibiotics recommended:  Patient is on recommended antibiotics - No changes needed  Results for orders placed or performed during the hospital encounter of 09/06/21  Blood Culture ID Panel (Reflexed) (Collected: 09/06/2021  9:38 AM)  Result Value Ref Range   Enterococcus faecalis NOT DETECTED NOT DETECTED   Enterococcus Faecium NOT DETECTED NOT DETECTED   Listeria monocytogenes NOT DETECTED NOT DETECTED   Staphylococcus species NOT DETECTED NOT DETECTED   Staphylococcus aureus (BCID) NOT DETECTED NOT DETECTED   Staphylococcus epidermidis NOT DETECTED NOT DETECTED   Staphylococcus lugdunensis NOT DETECTED NOT DETECTED   Streptococcus species NOT DETECTED NOT DETECTED   Streptococcus agalactiae NOT DETECTED NOT DETECTED   Streptococcus pneumoniae NOT DETECTED NOT DETECTED   Streptococcus pyogenes NOT DETECTED NOT DETECTED   A.calcoaceticus-baumannii NOT DETECTED NOT DETECTED   Bacteroides fragilis NOT DETECTED NOT DETECTED   Enterobacterales DETECTED (A) NOT DETECTED   Enterobacter cloacae complex NOT DETECTED NOT DETECTED   Escherichia coli NOT DETECTED NOT DETECTED   Klebsiella aerogenes NOT DETECTED NOT DETECTED   Klebsiella oxytoca NOT DETECTED NOT DETECTED   Klebsiella pneumoniae DETECTED (A) NOT DETECTED   Proteus species NOT DETECTED NOT DETECTED   Salmonella species NOT DETECTED NOT DETECTED   Serratia marcescens NOT DETECTED NOT DETECTED   Haemophilus influenzae NOT DETECTED NOT DETECTED    Neisseria meningitidis NOT DETECTED NOT DETECTED   Pseudomonas aeruginosa NOT DETECTED NOT DETECTED   Stenotrophomonas maltophilia NOT DETECTED NOT DETECTED   Candida albicans NOT DETECTED NOT DETECTED   Candida auris NOT DETECTED NOT DETECTED   Candida glabrata NOT DETECTED NOT DETECTED   Candida krusei NOT DETECTED NOT DETECTED   Candida parapsilosis NOT DETECTED NOT DETECTED   Candida tropicalis NOT DETECTED NOT DETECTED   Cryptococcus neoformans/gattii NOT DETECTED NOT DETECTED   CTX-M ESBL NOT DETECTED NOT DETECTED   Carbapenem resistance IMP NOT DETECTED NOT DETECTED   Carbapenem resistance KPC NOT DETECTED NOT DETECTED   Carbapenem resistance NDM NOT DETECTED NOT DETECTED   Carbapenem resist OXA 48 LIKE NOT DETECTED NOT DETECTED   Carbapenem resistance VIM NOT DETECTED NOT [place], PharmD, BCPS Clinical Pharmacist   09/06/2021  10:24 PM

## 2021-09-06 NOTE — ED Notes (Signed)
Pt c/o n/v/d x 1 week.  Reports he was diagnosed w/ gout x 1 week ago as well, but did not take any prescribed medication until this morning.   Pt provided ice water per EDP.

## 2021-09-06 NOTE — ED Notes (Signed)
2nd lactic was discontinued per verbal order from [facility] MD.

## 2021-09-06 NOTE — ED Notes (Signed)
Patient transported to X-ray 

## 2021-09-06 NOTE — Sepsis Progress Note (Signed)
Elink tracking the Code Sepsis. 

## 2021-09-06 NOTE — ED Notes (Signed)
Patient transported to CT 

## 2021-09-06 NOTE — Sepsis Progress Note (Signed)
Notified bedside nurse of need to draw repeat lactic acid and administer the antibiotics.

## 2021-09-06 NOTE — ED Notes (Signed)
CT notified that IV has been placed.

## 2021-09-06 NOTE — ED Notes (Signed)
Pt requesting pain medication.  

## 2021-09-06 NOTE — ED Notes (Signed)
Pt. Walked from subwait to Nursing station independently, yelling at staff, "I am done! I can't take it anymore!" Pt. Reassured by this RN, offered a wheelchair back to his recliner in [place]. Pt. States he has to go to the bathroom. This RN showed pt. To bathroom, and provided paperscrubs because pt. States he urinated on himself. Pt. Ambulated to the bathroom indep, and changed his clothing with minor encouragement from staff. Pt. Transported back to subwait recliner in wheelchair and was able to transfer himself to recliner. VS obtained.

## 2021-09-06 NOTE — Consult Note (Signed)
CODE SEPSIS - PHARMACY COMMUNICATION  **Broad Spectrum Antibiotics should be administered within 1 hour of Sepsis diagnosis**  Time Code Sepsis Called/Page Received: 1455  Antibiotics Ordered: Vancomycin, cefepime and metronidazole  Time of 1st antibiotic administration: 1550  Additional action taken by pharmacy: none  If necessary, Name of Provider/Nurse Contacted: n/a   Natasja Niday Rodriguez-Guzman PharmD, BCPS 09/06/2021 3:51 PM

## 2021-09-07 ENCOUNTER — Encounter: Payer: Self-pay | Admitting: Family Medicine

## 2021-09-07 ENCOUNTER — Inpatient Hospital Stay (HOSPITAL_COMMUNITY)
Admit: 2021-09-07 | Discharge: 2021-09-07 | Disposition: A | Discharge status: Home / Self Care | Payer: Medicare Other | Attending: Family Medicine | Admitting: Family Medicine

## 2021-09-07 DIAGNOSIS — R778 Other specified abnormalities of plasma proteins: Secondary | ICD-10-CM

## 2021-09-07 DIAGNOSIS — J189 Pneumonia, unspecified organism: Secondary | ICD-10-CM | POA: Diagnosis present

## 2021-09-07 DIAGNOSIS — R0609 Other forms of dyspnea: Secondary | ICD-10-CM

## 2021-09-07 DIAGNOSIS — R7881 Bacteremia: Secondary | ICD-10-CM

## 2021-09-07 DIAGNOSIS — N10 Acute tubulo-interstitial nephritis: Secondary | ICD-10-CM

## 2021-09-07 DIAGNOSIS — B961 Klebsiella pneumoniae [K. pneumoniae] as the cause of diseases classified elsewhere: Secondary | ICD-10-CM

## 2021-09-07 DIAGNOSIS — J188 Other pneumonia, unspecified organism: Secondary | ICD-10-CM | POA: Diagnosis present

## 2021-09-07 HISTORY — DX: Acute pyelonephritis: N10

## 2021-09-07 HISTORY — DX: Pneumonia, unspecified organism: J18.9

## 2021-09-07 HISTORY — DX: Bacteremia: R78.81

## 2021-09-07 HISTORY — DX: Klebsiella pneumoniae (k. pneumoniae) as the cause of diseases classified elsewhere: B96.1

## 2021-09-07 LAB — CBC
HCT: 26.4 % — ABNORMAL LOW (ref 39.0–52.0)
Hemoglobin: 9.3 g/dL — ABNORMAL LOW (ref 13.0–17.0)
MCH: 31.1 pg (ref 26.0–34.0)
MCHC: 35.2 g/dL (ref 30.0–36.0)
MCV: 88.3 fL (ref 80.0–100.0)
Platelets: 95 10*3/uL — ABNORMAL LOW (ref 150–400)
RBC: 2.99 MIL/uL — ABNORMAL LOW (ref 4.22–5.81)
RDW: 14.2 % (ref 11.5–15.5)
WBC: 4.1 10*3/uL (ref 4.0–10.5)
nRBC: 0 % (ref 0.0–0.2)

## 2021-09-07 LAB — BASIC METABOLIC PANEL
Anion gap: 8 (ref 5–15)
BUN: 25 mg/dL — ABNORMAL HIGH (ref 6–20)
CO2: 20 mmol/L — ABNORMAL LOW (ref 22–32)
Calcium: 8.7 mg/dL — ABNORMAL LOW (ref 8.9–10.3)
Chloride: 100 mmol/L (ref 98–111)
Creatinine, Ser: 0.8 mg/dL (ref 0.61–1.24)
GFR, Estimated: >60 mL/min (ref >60)
Glucose, Bld: 308 mg/dL — ABNORMAL HIGH (ref 70–99)
Potassium: 3.6 mmol/L (ref 3.5–5.1)
Sodium: 128 mmol/L — ABNORMAL LOW (ref 135–145)

## 2021-09-07 LAB — PROCALCITONIN: Procalcitonin: 4.81 ng/mL

## 2021-09-07 LAB — GLUCOSE, CAPILLARY
Glucose-Capillary: 301 mg/dL — ABNORMAL HIGH (ref 70–99)
Glucose-Capillary: 304 mg/dL — ABNORMAL HIGH (ref 70–99)
Glucose-Capillary: 248 mg/dL — ABNORMAL HIGH (ref 70–99)
Glucose-Capillary: 239 mg/dL — ABNORMAL HIGH (ref 70–99)
Glucose-Capillary: 201 mg/dL — ABNORMAL HIGH (ref 70–99)

## 2021-09-07 LAB — HEMOGLOBIN A1C
Hgb A1c MFr Bld: 7.6 % — ABNORMAL HIGH (ref 4.8–5.6)
Mean Plasma Glucose: 171 mg/dL

## 2021-09-07 LAB — ECHOCARDIOGRAM COMPLETE
Height: 72 in
S' Lateral: 5.4 cm
Weight: 4320 oz

## 2021-09-07 LAB — TROPONIN I (HIGH SENSITIVITY)
Troponin I (High Sensitivity): 164 ng/L (ref <18)
Troponin I (High Sensitivity): 147 ng/L (ref <18)

## 2021-09-07 LAB — TSH: TSH: 1.751 u[IU]/mL (ref 0.350–4.500)

## 2021-09-07 MED ORDER — PANTOPRAZOLE SODIUM 40 MG PO TBEC
40.0000 mg | DELAYED_RELEASE_TABLET | Freq: Every day | ORAL | Status: DC
  Administered 2021-09-07 – 2021-09-09 (×3): 40 mg via ORAL
  Filled 2021-09-07 (×3): qty 1

## 2021-09-07 MED ORDER — HEPARIN SODIUM (PORCINE) 5000 UNIT/ML IJ SOLN
5000.0000 [IU] | Freq: Three times a day (TID) | INTRAMUSCULAR | Status: DC
  Administered 2021-09-07 – 2021-09-09 (×5): 5000 [IU] via SUBCUTANEOUS
  Filled 2021-09-07 (×5): qty 1

## 2021-09-07 MED ORDER — INSULIN DETEMIR 100 UNIT/ML ~~LOC~~ SOLN
15.0000 [IU] | Freq: Two times a day (BID) | SUBCUTANEOUS | Status: DC
  Administered 2021-09-07 – 2021-09-09 (×5): 15 [IU] via SUBCUTANEOUS
  Filled 2021-09-07 (×7): qty 0.15

## 2021-09-07 MED ORDER — AZITHROMYCIN 250 MG PO TABS
500.0000 mg | ORAL_TABLET | Freq: Every day | ORAL | Status: DC
  Administered 2021-09-07: 18:00:00 500 mg via ORAL
  Filled 2021-09-07: qty 2

## 2021-09-07 NOTE — Progress Notes (Addendum)
Progress Note    Brian Williams  YNW:[id number] DOB: [birth year]  DOA: 09/06/2021 PCP: Pleas Koch, NP      Brief Narrative:    Medical records reviewed and are as summarized below:  Brian Williams is a 60 y.o. male with medical history significant for type II insulin-dependent diabetes mellitus, chronic systolic CHF/ischemic cardiomyopathy s/p AICD, CAD with coronary stent, recent gout diagnosis (right knee pain), diabetic neuropathy, OSA on CPAP, hypertension, hyperlipidemia, anxiety, GERD, obesity.  He presented to the hospital because of nausea, shortness of breath, cough, vomiting and diarrhea for about 1 week duration.  He had been taking hydrocodone and naproxen for right knee pain and was diagnosed as gout about a week prior to admission.      Assessment/Plan:   Principal Problem:   Sepsis ([place]) Active Problems:   Acute pyelonephritis   Multifocal pneumonia   Bacteremia due to Klebsiella pneumoniae   Body mass index is 36.62 kg/m.  (Obesity)  Sepsis secondary to acute pyelonephritis and multifocal pneumonia, Klebsiella pneumoniae bacteremia: Continue empiric IV antibiotics.  Discontinue IV fluids to avoid fluid overload.  Blood culture showed Klebsiella pneumoniae  (1 out of 4 bottles).  Follow-up culture and sensitivity report.  Consult ID specialist to assist with management.  IDDM with hyperglycemia: Increase Levemir from 10 units nightly to 15 units twice daily.  He said he no longer uses his insulin pump  Hyponatremia: Improving.  Hyperglycemia is likely contributing to low sodium.  Positive heme stools: No overt GI bleeding.  H&H is stable.  Chronic systolic CHF/ischemic cardiomyopathy: EF was 20 to 25% in January 2022.  Continue Entresto, Aldactone and Toprol-XL.  He has an ICD in place  CAD with coronary stents: Continue aspirin, Plavix and rosuvastatin    Diet Order             Diet clear liquid Room service appropriate? Yes; Fluid  consistency: Thin  Diet effective now                      Consultants: None  Procedures: None    Medications:    aspirin EC  81 mg Oral Daily   azithromycin  500 mg Oral Daily   clopidogrel  75 mg Oral Daily   colchicine  0.6 mg Oral Daily   escitalopram  20 mg Oral Daily   ezetimibe  10 mg Oral Daily   gabapentin  1,200 mg Oral BID   gemfibrozil  600 mg Oral Daily   heparin injection (subcutaneous)  5,000 Units Subcutaneous Q8H   insulin aspart  0-20 Units Subcutaneous TID WC   insulin aspart  0-5 Units Subcutaneous QHS   insulin detemir  15 Units Subcutaneous BID   metoprolol succinate  50 mg Oral Daily   omega-3 acid ethyl esters  1 g Oral BID   pantoprazole  40 mg Oral Daily   rosuvastatin  20 mg Oral Daily   sacubitril-valsartan  1 tablet Oral BID   spironolactone  25 mg Oral Daily   traZODone  200 mg Oral QHS   Continuous Infusions:  cefTRIAXone (ROCEPHIN)  IV 2 g (09/07/21 0542)   promethazine (PHENERGAN) injection (IM or IVPB) 12.5 mg (09/07/21 0432)     Anti-infectives (From admission, onward)    Start     Dose/Rate Route Frequency Ordered Stop   09/07/21 1800  azithromycin (ZITHROMAX) tablet 500 mg        500 mg Oral Daily 09/07/21 0909  09/09/21 1759   09/07/21 0600  cefTRIAXone (ROCEPHIN) 2 g in sodium chloride 0.9 % 100 mL IVPB       Note to Pharmacy: When can I start? How long does cefepime dose last until   2 g 200 mL/hr over 30 Minutes Intravenous Every 24 hours 09/06/21 1902 09/17/21 0559   09/06/21 2000  azithromycin (ZITHROMAX) 500 mg in sodium chloride 0.9 % 250 mL IVPB  Status:  Discontinued        500 mg 250 mL/hr over 60 Minutes Intravenous Every 24 hours 09/06/21 1946 09/07/21 0909   09/06/21 1515  vancomycin (VANCOCIN) IVPB 1000 mg/200 mL premix        1,000 mg 200 mL/hr over 60 Minutes Intravenous  Once 09/06/21 1500 09/06/21 2042   09/06/21 1500  ceFEPIme (MAXIPIME) 2 g in sodium chloride 0.9 % 100 mL IVPB        2 g 200  mL/hr over 30 Minutes Intravenous  Once 09/06/21 1452 09/06/21 1630   09/06/21 1500  metroNIDAZOLE (FLAGYL) IVPB 500 mg        500 mg 100 mL/hr over 60 Minutes Intravenous  Once 09/06/21 1452 09/06/21 1821   09/06/21 1500  vancomycin (VANCOCIN) IVPB 1000 mg/200 mL premix        1,000 mg 200 mL/hr over 60 Minutes Intravenous  Once 09/06/21 1452 09/06/21 2133              Family Communication/Anticipated D/C date and plan/Code Status   DVT prophylaxis: heparin injection 5,000 Units Start: 09/07/21 2200 SCDs Start: 09/06/21 1933     Code Status: Full Code  Family Communication: None Disposition Plan: Possible discharge to home in 2 to 3 days   Status is: Inpatient  Remains inpatient appropriate because: IV antibiotic           Subjective:   Interval events noted.  He does not provide much history.  No abdominal pain, chest pain, back pain, vomiting, diarrhea, shortness of breath or cough.  Objective:    Vitals:   09/06/21 2130 09/06/21 2300 09/07/21 0437 09/07/21 0834  BP: 124/76 113/71 (!) 114/57 101/64  Pulse: (!) 104 100 88 80  Resp: 20 18 18 18   Temp:  99.8 F (37.7 C) 98.7 F (37.1 C) 98.3 F (36.8 C)  TempSrc:    Oral  SpO2: 97% 99% 95% 97%  Weight:      Height:       No data found.   Intake/Output Summary (Last 24 hours) at 09/07/2021 1222 Last data filed at 09/07/2021 1051 Gross per 24 hour  Intake 816.16 ml  Output 7 ml  Net 809.16 ml   Filed Weights   09/06/21 0923  Weight: 122.5 kg    Exam:  GEN: NAD SKIN: No rash EYES: EOMI ENT: MMM CV: RRR PULM: CTA B ABD: soft, obese, NT, +BS CNS: AAO x 3, non focal EXT: No edema or tenderness        Data Reviewed:   I have personally reviewed following labs and imaging studies:  Labs: Labs show the following:   Basic Metabolic Panel: Recent Labs  Lab 09/06/21 0937 09/07/21 0529  NA 124* 128*  K 3.8 3.6  CL 94* 100  CO2 18* 20*  GLUCOSE 365* 308*  BUN 27* 25*   CREATININE 0.83 0.80  CALCIUM 8.9 8.7*   GFR Estimated Creatinine Clearance: 132.8 mL/min (by C-G formula based on SCr of 0.8 mg/dL). Liver Function Tests: Recent Labs  Lab 09/06/21 [phone]  AST 25  ALT 21  ALKPHOS 45  BILITOT 0.8  PROT 7.2  ALBUMIN 3.6   No results for input(s): LIPASE, AMYLASE in the last 168 hours. No results for input(s): AMMONIA in the last 168 hours. Coagulation profile No results for input(s): INR, PROTIME in the last 168 hours.  CBC: Recent Labs  Lab 09/06/21 0937 09/07/21 0529  WBC 5.2 4.1  NEUTROABS 3.7  --   HGB 9.5* 9.3*  HCT 26.1* 26.4*  MCV 87.9 88.3  PLT 134* 95*   Cardiac Enzymes: No results for input(s): CKTOTAL, CKMB, CKMBINDEX, TROPONINI in the last 168 hours. BNP (last 3 results) No results for input(s): PROBNP in the last 8760 hours. CBG: Recent Labs  Lab 09/06/21 0931 09/06/21 2144 09/07/21 0833 09/07/21 1124  GLUCAP 384* 336* 301* 304*   D-Dimer: No results for input(s): DDIMER in the last 72 hours. Hgb A1c: No results for input(s): HGBA1C in the last 72 hours. Lipid Profile: No results for input(s): CHOL, HDL, LDLCALC, TRIG, CHOLHDL, LDLDIRECT in the last 72 hours. Thyroid function studies: Recent Labs    09/07/21 0529  TSH 1.751   Anemia work up: No results for input(s): VITAMINB12, FOLATE, FERRITIN, TIBC, IRON, RETICCTPCT in the last 72 hours. Sepsis Labs: Recent Labs  Lab 09/06/21 0937 09/07/21 0529  PROCALCITON 5.71 4.81  WBC 5.2 4.1  LATICACIDVEN 1.6  --     Microbiology Recent Results (from the past 240 hour(s))  Blood culture (single)     Status: None (Preliminary result)   Collection Time: 09/06/21  9:38 AM   Specimen: BLOOD  Result Value Ref Range Status   Specimen Description BLOOD LEFT HAND  Final   Special Requests   Final    BOTTLES DRAWN AEROBIC AND ANAEROBIC Blood Culture results may not be optimal due to an excessive volume of blood received in culture bottles   Culture  Setup Time    Final    Organism ID to follow GRAM NEGATIVE RODS IN BOTH AEROBIC AND ANAEROBIC BOTTLES CRITICAL RESULT CALLED TO, READ BACK BY AND VERIFIED WITH: CARISSA DOLAN @ 2146 ON 09/06/21.Marland KitchenMarland KitchenTKR Performed at [facility], [address] [place], [place] [address]    Culture GRAM NEGATIVE RODS  Final   Report Status PENDING  Incomplete  Blood Culture ID Panel (Reflexed)     Status: Abnormal   Collection Time: 09/06/21  9:38 AM  Result Value Ref Range Status   Enterococcus faecalis NOT DETECTED NOT DETECTED Final   Enterococcus Faecium NOT DETECTED NOT DETECTED Final   Listeria monocytogenes NOT DETECTED NOT DETECTED Final   Staphylococcus species NOT DETECTED NOT DETECTED Final   Staphylococcus aureus (BCID) NOT DETECTED NOT DETECTED Final   Staphylococcus epidermidis NOT DETECTED NOT DETECTED Final   Staphylococcus lugdunensis NOT DETECTED NOT DETECTED Final   Streptococcus species NOT DETECTED NOT DETECTED Final   Streptococcus agalactiae NOT DETECTED NOT DETECTED Final   Streptococcus pneumoniae NOT DETECTED NOT DETECTED Final   Streptococcus pyogenes NOT DETECTED NOT DETECTED Final   A.calcoaceticus-baumannii NOT DETECTED NOT DETECTED Final   Bacteroides fragilis NOT DETECTED NOT DETECTED Final   Enterobacterales DETECTED (A) NOT DETECTED Final    Comment: Enterobacterales represent a large order of gram negative bacteria, not a single organism. CRITICAL RESULT CALLED TO, READ BACK BY AND VERIFIED WITH: CARISSA DOLAN @ 2146 ON 09/06/21.Marland KitchenMarland KitchenTKR    Enterobacter cloacae complex NOT DETECTED NOT DETECTED Final   Escherichia coli NOT DETECTED NOT DETECTED Final   Klebsiella aerogenes NOT  DETECTED NOT DETECTED Final   Klebsiella oxytoca NOT DETECTED NOT DETECTED Final   Klebsiella pneumoniae DETECTED (A) NOT DETECTED Final    Comment: CRITICAL RESULT CALLED TO, READ BACK BY AND VERIFIED WITH: CARISSA DOLAN @ 2146 ON 09/06/21.Marland KitchenMarland KitchenTKR    Proteus species NOT DETECTED NOT DETECTED Final    Salmonella species NOT DETECTED NOT DETECTED Final   Serratia marcescens NOT DETECTED NOT DETECTED Final   Haemophilus influenzae NOT DETECTED NOT DETECTED Final   Neisseria meningitidis NOT DETECTED NOT DETECTED Final   Pseudomonas aeruginosa NOT DETECTED NOT DETECTED Final   Stenotrophomonas maltophilia NOT DETECTED NOT DETECTED Final   Candida albicans NOT DETECTED NOT DETECTED Final   Candida auris NOT DETECTED NOT DETECTED Final   Candida glabrata NOT DETECTED NOT DETECTED Final   Candida krusei NOT DETECTED NOT DETECTED Final   Candida parapsilosis NOT DETECTED NOT DETECTED Final   Candida tropicalis NOT DETECTED NOT DETECTED Final   Cryptococcus neoformans/gattii NOT DETECTED NOT DETECTED Final   CTX-M ESBL NOT DETECTED NOT DETECTED Final   Carbapenem resistance IMP NOT DETECTED NOT DETECTED Final   Carbapenem resistance KPC NOT DETECTED NOT DETECTED Final   Carbapenem resistance NDM NOT DETECTED NOT DETECTED Final   Carbapenem resist OXA 48 LIKE NOT DETECTED NOT DETECTED Final   Carbapenem resistance VIM NOT DETECTED NOT DETECTED Final    Comment: Performed at [facility], [address] [place], [place] [address]  Resp Panel by RT-PCR (Flu A&B, Covid) Nasopharyngeal Swab     Status: None   Collection Time: 09/06/21  1:25 PM   Specimen: Nasopharyngeal Swab; Nasopharyngeal(NP) swabs in vial transport medium  Result Value Ref Range Status   SARS Coronavirus 2 by RT PCR NEGATIVE NEGATIVE Final    Comment: (NOTE) SARS-CoV-2 target nucleic acids are NOT DETECTED.  The SARS-CoV-2 RNA is generally detectable in upper respiratory specimens during the acute phase of infection. The lowest concentration of SARS-CoV-2 viral copies this assay can detect is 138 copies/mL. A negative result does not preclude SARS-Cov-2 infection and should not be used as the sole basis for treatment or other patient management decisions. A negative result may occur with  improper specimen  collection/handling, submission of specimen other than nasopharyngeal swab, presence of viral mutation(s) within the areas targeted by this assay, and inadequate number of viral copies(<138 copies/mL). A negative result must be combined with clinical observations, patient history, and epidemiological information. The expected result is Negative.  Fact Sheet for Patients:  EntrepreneurPulse.com.au  Fact Sheet for Healthcare Providers:  IncredibleEmployment.be  This test is no t yet approved or cleared by the [place] FDA and  has been authorized for detection and/or diagnosis of SARS-CoV-2 by FDA under an Emergency Use Authorization (EUA). This EUA will remain  in effect (meaning this test can be used) for the duration of the COVID-19 declaration under Section 564(b)(1) of the Act, 21 U.S.C.section 360bbb-3(b)(1), unless the authorization is terminated  or revoked sooner.       Influenza A by PCR NEGATIVE NEGATIVE Final   Influenza B by PCR NEGATIVE NEGATIVE Final    Comment: (NOTE) The Xpert Xpress SARS-CoV-2/FLU/RSV plus assay is intended as an aid in the diagnosis of influenza from Nasopharyngeal swab specimens and should not be used as a sole basis for treatment. Nasal washings and aspirates are unacceptable for Xpert Xpress SARS-CoV-2/FLU/RSV testing.  Fact Sheet for Patients: EntrepreneurPulse.com.au  Fact Sheet for Healthcare Providers: IncredibleEmployment.be  This test is not yet approved or cleared by  the [employer] and has been authorized for detection and/or diagnosis of SARS-CoV-2 by FDA under an Emergency Use Authorization (EUA). This EUA will remain in effect (meaning this test can be used) for the duration of the COVID-19 declaration under Section 564(b)(1) of the Act, 21 U.S.C. section 360bbb-3(b)(1), unless the authorization is terminated or revoked.  Performed at [facility], [address] [place], [place] [address]   Blood culture (single)     Status: None (Preliminary result)   Collection Time: 09/06/21  2:04 PM   Specimen: BLOOD  Result Value Ref Range Status   Specimen Description BLOOD BLOOD RIGHT HAND  Final   Special Requests   Final    BOTTLES DRAWN AEROBIC AND ANAEROBIC Blood Culture adequate volume   Culture  Setup Time   Final    GRAM NEGATIVE RODS IN BOTH AEROBIC AND ANAEROBIC BOTTLES CRITICAL VALUE NOTED.  VALUE IS CONSISTENT WITH PREVIOUSLY REPORTED AND CALLED VALUE. Performed at [facility], [address] [place], [place] [address]    Culture GRAM NEGATIVE RODS  Final   Report Status PENDING  Incomplete    Procedures and diagnostic studies:  DG Chest 2 View  Result Date: 09/06/2021 CLINICAL DATA:  Shortness of breath EXAM: CHEST - 2 VIEW COMPARISON:  Chest x-ray dated December 06, 2020 FINDINGS: Cardiac and mediastinal contours are unchanged post median sternotomy. Left chest wall cardiac conduction device with unchanged lead position. Mild bilateral heterogeneous opacities. No large pleural effusion or pneumothorax. IMPRESSION: Mild bilateral heterogeneous opacities, possibly due to pulmonary edema or atelectasis. Electronically Signed   By: Yetta Glassman M.D.   On: 09/06/2021 15:40   CT HEAD WO CONTRAST (5MM)  Result Date: 09/06/2021 CLINICAL DATA:  Altered mental status, nausea/vomiting EXAM: CT HEAD WITHOUT CONTRAST TECHNIQUE: Contiguous axial images were obtained from the base of the skull through the vertex without intravenous contrast. COMPARISON:  11/09/2015 FINDINGS: Brain: No evidence of acute infarction, hemorrhage, hydrocephalus, extra-axial collection or mass lesion/mass effect. Mild subcortical white matter and periventricular small vessel ischemic changes. Vascular: Intracranial atherosclerosis. Skull: Normal. Negative for fracture or focal lesion. Sinuses/Orbits: The visualized paranasal sinuses  are essentially clear. The mastoid air cells are unopacified. Other: None. IMPRESSION: No evidence of acute intracranial abnormality. Mild small vessel ischemic changes. Electronically Signed   By: Julian Hy M.D.   On: 09/06/2021 17:20   CT Angio Chest PE W and/or Wo Contrast  Result Date: 09/06/2021 CLINICAL DATA:  Shortness of breath, evaluate for PE. Right lower quadrant pain. Nausea/vomiting x1 week. EXAM: CT ANGIOGRAPHY CHEST CT ABDOMEN AND PELVIS WITH CONTRAST TECHNIQUE: Multidetector CT imaging of the chest was performed using the standard protocol during bolus administration of intravenous contrast. Multiplanar CT image reconstructions and MIPs were obtained to evaluate the vascular anatomy. Multidetector CT imaging of the abdomen and pelvis was performed using the standard protocol during bolus administration of intravenous contrast. CONTRAST:  158mL OMNIPAQUE IOHEXOL 350 MG/ML SOLN COMPARISON:  Chest radiographs dated 09/06/2021. CT abdomen/pelvis dated 04/03/2021. FINDINGS: CTA CHEST FINDINGS Cardiovascular: Satisfactory opacification of the bilateral pulmonary arteries to the segmental level. No evidence of pulmonary embolism. Although not tailored for evaluation of the thoracic aorta, there is no evidence of thoracic aneurysm or dissection. Atherosclerotic calcification of the aortic arch Cardiomegaly.  No pericardial effusion. Three vessel coronary atherosclerosis. Postsurgical changes related to prior CABG. Left subclavian ICD. Mediastinum/Nodes: No suspicious mediastinal lymphadenopathy. Visualized thyroid is unremarkable. Lungs/Pleura: Mild patchy opacities in the lungs bilaterally, upper lobe  predominant, favoring multifocal pneumonia. No suspicious pulmonary nodules. No pleural effusions or pneumothorax. Musculoskeletal: Mild degenerative changes of the mid/lower thoracic spine. Median sternotomy. Review of the MIP images confirms the above findings. CT ABDOMEN and PELVIS FINDINGS  Hepatobiliary: Liver is notable for scattered subcentimeter cysts. Status post cholecystectomy. No intrahepatic or extrahepatic dilatation. Pancreas: Within normal limits. Spleen: Within normal limits. Adrenals/Urinary Tract: Adrenal glands are within normal limits. Heterogeneous enhancement of the posterior interpolar left kidney (series 5/image 45), masslike but new, favoring focal pyelonephritis. Right kidney is within normal limits. No hydronephrosis. Mildly thick-walled bladder, although underdistended. Stomach/Bowel: Stomach is within normal limits. No evidence of bowel obstruction.  Duodenal diverticulum. Status post appendectomy. No colonic wall thickening or inflammatory changes. Vascular/Lymphatic: No evidence of abdominal aortic aneurysm. Atherosclerotic calcifications of the abdominal aorta and branch vessels. No suspicious abdominopelvic lymphadenopathy. Reproductive: Prostate is unremarkable, noting dystrophic calcifications. Other: No abdominopelvic ascites. Musculoskeletal: Mild degenerative changes of the lumbar spine. Review of the MIP images confirms the above findings. IMPRESSION: No evidence of pulmonary embolism. Multifocal pneumonia, upper lobe predominant. Left renal pyelonephritis. Mildly thick-walled bladder, correlate for cystitis. Prior appendectomy. Electronically Signed   By: Julian Hy M.D.   On: 09/06/2021 17:27   CT ABDOMEN PELVIS W CONTRAST  Result Date: 09/06/2021 CLINICAL DATA:  Shortness of breath, evaluate for PE. Right lower quadrant pain. Nausea/vomiting x1 week. EXAM: CT ANGIOGRAPHY CHEST CT ABDOMEN AND PELVIS WITH CONTRAST TECHNIQUE: Multidetector CT imaging of the chest was performed using the standard protocol during bolus administration of intravenous contrast. Multiplanar CT image reconstructions and MIPs were obtained to evaluate the vascular anatomy. Multidetector CT imaging of the abdomen and pelvis was performed using the standard protocol during bolus  administration of intravenous contrast. CONTRAST:  127mL OMNIPAQUE IOHEXOL 350 MG/ML SOLN COMPARISON:  Chest radiographs dated 09/06/2021. CT abdomen/pelvis dated 04/03/2021. FINDINGS: CTA CHEST FINDINGS Cardiovascular: Satisfactory opacification of the bilateral pulmonary arteries to the segmental level. No evidence of pulmonary embolism. Although not tailored for evaluation of the thoracic aorta, there is no evidence of thoracic aneurysm or dissection. Atherosclerotic calcification of the aortic arch Cardiomegaly.  No pericardial effusion. Three vessel coronary atherosclerosis. Postsurgical changes related to prior CABG. Left subclavian ICD. Mediastinum/Nodes: No suspicious mediastinal lymphadenopathy. Visualized thyroid is unremarkable. Lungs/Pleura: Mild patchy opacities in the lungs bilaterally, upper lobe predominant, favoring multifocal pneumonia. No suspicious pulmonary nodules. No pleural effusions or pneumothorax. Musculoskeletal: Mild degenerative changes of the mid/lower thoracic spine. Median sternotomy. Review of the MIP images confirms the above findings. CT ABDOMEN and PELVIS FINDINGS Hepatobiliary: Liver is notable for scattered subcentimeter cysts. Status post cholecystectomy. No intrahepatic or extrahepatic dilatation. Pancreas: Within normal limits. Spleen: Within normal limits. Adrenals/Urinary Tract: Adrenal glands are within normal limits. Heterogeneous enhancement of the posterior interpolar left kidney (series 5/image 45), masslike but new, favoring focal pyelonephritis. Right kidney is within normal limits. No hydronephrosis. Mildly thick-walled bladder, although underdistended. Stomach/Bowel: Stomach is within normal limits. No evidence of bowel obstruction.  Duodenal diverticulum. Status post appendectomy. No colonic wall thickening or inflammatory changes. Vascular/Lymphatic: No evidence of abdominal aortic aneurysm. Atherosclerotic calcifications of the abdominal aorta and branch  vessels. No suspicious abdominopelvic lymphadenopathy. Reproductive: Prostate is unremarkable, noting dystrophic calcifications. Other: No abdominopelvic ascites. Musculoskeletal: Mild degenerative changes of the lumbar spine. Review of the MIP images confirms the above findings. IMPRESSION: No evidence of pulmonary embolism. Multifocal pneumonia, upper lobe predominant. Left renal pyelonephritis. Mildly thick-walled bladder, correlate for cystitis. Prior appendectomy. Electronically Signed   By: Bertis Ruddy  [place] Pink M.D.   On: 09/06/2021 17:27   US Venous Img Lower Unilateral Right (DVT)  Result Date: 09/06/2021 CLINICAL DATA:  Right knee pain. EXAM: RIGHT LOWER EXTREMITY VENOUS DOPPLER ULTRASOUND TECHNIQUE: Gray-scale sonography with graded compression, as well as color Doppler and duplex ultrasound were performed to evaluate the lower extremity deep venous systems from the level of the common femoral vein and including the common femoral, femoral, profunda femoral, popliteal and calf veins including the posterior tibial, peroneal and gastrocnemius veins when visible. The superficial great saphenous vein was also interrogated. Spectral Doppler was utilized to evaluate flow at rest and with distal augmentation maneuvers in the common femoral, femoral and popliteal veins. COMPARISON:  None. FINDINGS: Contralateral Common Femoral Vein: Respiratory phasicity is normal and symmetric with the symptomatic side. No evidence of thrombus. Normal compressibility. Common Femoral Vein: No evidence of thrombus. Normal compressibility, respiratory phasicity and response to augmentation. Saphenofemoral Junction: No evidence of thrombus. Normal compressibility and flow on color Doppler imaging. Profunda Femoral Vein: No evidence of thrombus. Normal compressibility and flow on color Doppler imaging. Femoral Vein: No evidence of thrombus. Normal compressibility, respiratory phasicity and response to augmentation. Popliteal Vein:  No evidence of thrombus. Normal compressibility, respiratory phasicity and response to augmentation. Calf Veins: No evidence of thrombus. Normal compressibility and flow on color Doppler imaging. Superficial Great Saphenous Vein: No evidence of thrombus. Normal compressibility. Venous Reflux:  None. Other Findings: No evidence of superficial thrombophlebitis or abnormal fluid collection. IMPRESSION: No evidence of right lower extremity deep venous thrombosis. Electronically Signed   By: Aletta Edouard M.D.   On: 09/06/2021 16:44   DG Knee Complete 4 Views Right  Result Date: 09/06/2021 CLINICAL DATA:  A 60 year old male presents for evaluation of knee pain. EXAM: RIGHT KNEE - COMPLETE 4+ VIEW COMPARISON:  August 28, 2021. FINDINGS: Tricompartmental osteoarthritic changes greatest in the lateral and patellofemoral compartments are similar to recent imaging. Osteopenia. No signs of fracture or dislocation. Soft tissues are unremarkable. No joint effusion. IMPRESSION: Similar appearance of tricompartmental osteoarthritic changes, no acute findings. Electronically Signed   By: Zetta Bills M.D.   On: 09/06/2021 15:41               LOS: 1 day   Stacey Maura  [facility]   Pager on [email or web address]. If 7PM-7AM, please contact night-coverage at [email or web address]     09/07/2021, 12:22 PM

## 2021-09-07 NOTE — Progress Notes (Signed)
Inpatient Diabetes Program Recommendations  AACE/ADA: New Consensus Statement on Inpatient Glycemic Control   Target Ranges:  Prepandial:   less than 140 mg/dL      Peak postprandial:   less than 180 mg/dL (1-2 hours)      Critically ill patients:  140 - 180 mg/dL    Latest Reference Range & Units 09/06/21 09:31 09/06/21 21:44  Glucose-Capillary 70 - 99 mg/dL 384 (H) 336 (H)    Latest Reference Range & Units 09/06/21 09:37  Beta-Hydroxybutyric Acid 0.05 - 0.27 mmol/L 0.81 (H)  Glucose 70 - 99 mg/dL 365 (H)   Review of Glycemic Control  Diabetes history: DM2 Outpatient Diabetes medications:  Insulin pump with Humulin R U500 insulin, Ozempic 1 mg Qweek Current orders for Inpatient glycemic control: Levemir 10 units QHS, Novolog 0-20 units TID with meals, Novolog 0-5 units QHS, Ozempic 2 mg Qweek  Inpatient Diabetes Program Recommendations:    Insulin: Please consider increasing Levemir to 15 units BID (to start this morning).   NOTE: In reviewing chart, noted patient has DM2 and uses an insulin pump with Humulin R U500 (concentrated insulin) insulin for DM control. Per chart, patient sees [facility] End[facility]ology and seen Dr. Loney Laurence on 02/24/21. Per office note on 02/24/21 by Dr. Loney Laurence patient is using a Medtronic 770G insulin pump with Humulin R U500 insulin and the following should be insulin pump settings:    Basal rates 12A 1.5 units/hour  7A 3.6 units/hour 7P 2 units/hour  Total Daily Basal: 63.7 units per day  Bolus settings I:C ratio 1: 7.5 g (1 unit covers 7.5 grams of carbs) ISF 13  mg/dl (1 unit drops glucose 13 mg/dl) Target 110-120 mg/dl Active insulin time 4 hrs.   Patient was inpatient 04/03/21-04/06/21 with appendicitis and diabetes coordinator spoke with patient on 04/04/21. Patient was using insulin pump during hospitalization and experienced hypoglycemia several times and patient was encouraged to reach out to his Endocrinologist to determine if insulin pump setting  adjustments could be made. No office visits or telephone visits with Endocrinology noted in the chart since hospital discharge on 04/06/21. Patient's initial glucose on 09/06/21 was 365 mg/dl. Per note by Dr. Jari Pigg on 09/06/21, " Patient reports has not been taking his diabetes medicine secondary to just not feeling well."  Addendum 08/18/21@10 :35-Spoke with patient at bedside regarding DM control. Patient states that he removed his insulin pump several days ago at home because he felt so poorly. Patient reports that he has on his Dexcom CGM sensor but he has not been monitoring glucose on it since admitted. Patient kept eyes closed during conversation and very brief response to questions asked. Informed patient of current insulin orders and that it would be requested that basal insulin be increased. Patient states that he has not seen his Endocrinologist since June 2022 and he has not been having any issues with hypoglycemia at home lately. Patient verbalized understanding and states he has no questions at this time.  Thanks, Barnie Alderman, RN, MSN, CDE Diabetes Coordinator Inpatient Diabetes Program [phone] (Team Pager from 8am to 5pm)

## 2021-09-07 NOTE — TOC Initial Note (Addendum)
Transition of Care [facility]) - Initial/Assessment Note    Patient Details  Name: Brian Williams MRN: [id number] Date of Birth: November 01, 1960  Transition of Care [facility]) CM/SW Contact:    Beverly Sessions, RN Phone Number: 09/07/2021, 1:11 PM  Clinical Narrative:                  Patient admitted from home with sepsis Patient with high risk for readmission score. Attempted to complete assessment with patient however he is soundly sleeping at this time.  VM left for wife to completed assessment   Per chart review patient lives at home with wife, PCP Carlis Abbott, mobile with staff   Update: received return call from wife.  Patient lives at home with wife and adult children.  Denies issues with transportation or obtaining mediations.  At Baseline patient is independent.      Patient Goals and CMS Choice        Expected Discharge Plan and Services                                                Prior Living Arrangements/Services                       Activities of Daily Living Home Assistive Devices/Equipment: Gilford Rile (specify type) (front wheel walker) ADL Screening (condition at time of admission) Patient's cognitive ability adequate to safely complete daily activities?: Yes Is the patient deaf or have difficulty hearing?: No Does the patient have difficulty seeing, even when wearing glasses/contacts?: No Does the patient have difficulty concentrating, remembering, or making decisions?: No Patient able to express need for assistance with ADLs?: Yes Does the patient have difficulty dressing or bathing?: Yes Independently performs ADLs?: No Communication: Independent Dressing (OT): Needs assistance Is this a change from baseline?: Change from baseline, expected to last <3days Grooming: Independent Feeding: Independent Bathing: Needs assistance Is this a change from baseline?: Change from baseline, expected to last <3 days Toileting: Needs assistance Is this a change  from baseline?: Change from baseline, expected to last <3 days In/Out Bed: Needs assistance Is this a change from baseline?: Change from baseline, expected to last <3 days Walks in Home: Independent with device (comment) (rolling front wheel walker) Does the patient have difficulty walking or climbing stairs?: No Weakness of Legs: None Weakness of Arms/Hands: None  Permission Sought/Granted                  Emotional Assessment              Admission diagnosis:  Sepsis ([place]) [A41.9] Right knee pain, unspecified chronicity [M25.561] Sepsis, due to unspecified organism, unspecified whether acute organ dysfunction present [facility]) [A41.9] Patient Active Problem List   Diagnosis Date Noted   Acute pyelonephritis 09/07/2021   Multifocal pneumonia 09/07/2021   Bacteremia due to Klebsiella pneumoniae 09/07/2021   Sepsis ([place]) 09/06/2021   Viral illness 07/13/2021   Lumbar degenerative disc disease 05/11/2021   Lumbar facet arthropathy 05/11/2021   Lumbar post-laminectomy syndrome 05/11/2021   Chronic pain syndrome 05/11/2021   Chronic SI joint pain 05/11/2021   Low back pain 05/10/2021   Acute appendicitis 04/03/2021   Depression 04/03/2021   Diarrhea 12/06/2020   Hypotension 12/06/2020   Tingling 12/01/2020   Vision changes 12/01/2020   Dizziness 11/23/2020   Sinusitis, acute 08/08/2020  Diabetic neuropathy associated with type 2 diabetes mellitus ([place]) 08/05/2020   Falls, initial encounter 08/05/2020   Leg pain, bilateral 08/05/2020   Sensory ataxia 08/05/2020   Chronic knee pain 07/25/2020   Myalgia 01/28/2019   Major depressive disorder 07/08/2017   Erectile dysfunction 07/08/2017   Hx of CABG 07/26/2016   Coronary artery disease 05/24/2016   Atherosclerosis of native coronary artery of native heart with stable angina pectoris ([place])    History of coronary artery stent placement    Unstable angina ([place]) 05/04/2016   Obesity (BMI 30-39.9) 04/06/2016   OSA on  CPAP 01/06/2016   Diabetes, polyneuropathy ([place]) 01/06/2016   Hidradenitis suppurativa 10/06/2015   Chronic back pain 05/02/2015   Essential hypertension 05/02/2015   Hyperlipidemia 05/02/2015   Generalized anxiety disorder 05/02/2015   ICD (implantable cardioverter-defibrillator) in place 04/20/2015   Congestive dilated cardiomyopathy ([place]) 04/20/2015   Type 2 diabetes mellitus with complications ([place]) 53/29/9242   Chronic systolic heart failure ([place]) 09/30/2014   PCP:  Pleas Koch, NP Pharmacy:   [facility] [place], [place] [address] Phone: [phone] Fax: [phone]  [facility] [id number] [place], [place] [address] AT [address] [place] [address] Phone: [phone] Fax: [phone]     Social Determinants of Health (SDOH) Interventions    Readmission Risk Interventions Readmission Risk Prevention Plan 09/07/2021  Palliative Care Screening Not Applicable  Medication Review (RN Care Manager) Complete  Some recent data might be hidden

## 2021-09-07 NOTE — Consult Note (Signed)
NAME: Brian Williams  DOB: [birth year]  MRN: [id number]  Date/Time: 09/07/2021 5:46 PM  REQUESTING PROVIDER: Clois Comber Subjective:  REASON FOR CONSULT: Klebsiella bacteremia ? Brian Williams is a 60 y.o. with a history of IDDM ischemic cardiomyopathy. CAD, s/p AICD, Sleep apnea on CPAP, HTN , hyperlipidemia , lap appendectomy for acute appendicitis 05/31/21 CHF presents from home because of not feeling well, severe nausea, vomiting, poor appetite for more than a week. He says he has had a cough for a more than 2 weeks. He has had pain in his rt knee for more than a month . HE has been in chair unable to walk for few weeks- He also says he cannot sleep in bed He went to ortho on 12/19 for acute pain. Diagnosed with primary OA and uric acid was ordered which came back as 12.5, CRP was 200 and ESR 70. HE denies any fever, dysuria, pain abdomen No sick contacts Has taken 3 doses of covid vaccine, the primary series In the ED temp 99.8, BP 113/71, HR 100 and sats 100% Labs showed WBC 5.2, HB 9.5, PLT 134, blood glucose 365, cr 0.83 Blood culture sent UA showed 11-20 wbc- CXR mild pulm edema Xray rt knee no effusion - tricompartmental OA As blood culture came back positive for  klebsiella and  I am seeing the patient Pt has not been taking insulin ( by pump) for the past week because he has been sick He also has been taking NSAIDS for the knee pain Pt says he continuous to feel nauseous  Past Medical History:  Diagnosis Date   AICD (automatic cardioverter/defibrillator) present    Allergy    Anxiety    takes Xanax as needed   Appendicitis    Arthritis    back,knees,right shoulder   Back pain    Cardiomyopathy ([place])    CHF (congestive heart failure) (HCC)    takes Lasix and Aldactone daily   Coronary artery disease    takes Plavix daily   Depression    takes Zoloft daily   Diabetes mellitus without complication (HCC)    Humulin R and Farxiga daily.Fasting blood sugar runs 140   GERD  (gastroesophageal reflux disease)    Headache    History of bronchitis    History of colon polyps    benign   History of hiatal hernia    Hyperlipidemia    takes Fenofibrate,Crestor, and Zetia daily   Hypertension    takes Entresto and Coreg daily   MI (myocardial infarction) ([place]) 2001   Obesity    Peripheral neuropathy    Pneumonia    history of-last time about 14 yrs ago   PONV (postoperative nausea and vomiting)    after knee surgery 25 yrs ago b/p stayed elevated for a while   Presence of permanent cardiac pacemaker    Shortness of breath dyspnea    with exertion   Sleep apnea    uses CPAP nightly   Ventricular tachycardia    s/p RFCA PVCs 2013    Past Surgical History:  Procedure Laterality Date   APPENDECTOMY     BACK SURGERY     CARDIAC CATHETERIZATION     CARDIAC CATHETERIZATION Left 05/10/2016   Procedure: Left Heart Cath and Coronary Angiography;  Surgeon: Minna Merritts, MD;  Location: [facility];  Service: Cardiovascular;  Laterality: Left;   CARDIAC DEFIBRILLATOR PLACEMENT  10/16/2007   ICD Model number 2207-36 serial number [id number]   CARDIAC ELECTROPHYSIOLOGY STUDY  AND ABLATION     CHOLECYSTECTOMY  2001   COLONOSCOPY     COLONOSCOPY WITH PROPOFOL N/A 08/28/2018   Procedure: COLONOSCOPY WITH PROPOFOL;  Surgeon: Jonathon Bellows, MD;  Location: [facility];  Service: Gastroenterology;  Laterality: N/A;   CORONARY ANGIOPLASTY WITH STENT PLACEMENT     7 stents   CORONARY ARTERY BYPASS GRAFT N/A 05/24/2016   Procedure: CORONARY ARTERY BYPASS GRAFTING (CABG) x four , using left internal mammary artery and left leg greater saphenous vein harvested endoscopically;  Surgeon: Gaye Pollack, MD;  Location: [facility];  Service: Open Heart Surgery;  Laterality: N/A;   EP IMPLANTABLE DEVICE N/A 06/16/2015   Procedure: ICD Generator Changeout;  Surgeon: Deboraha Sprang, MD;  Location: [facility];  Service: Cardiovascular;  Laterality: N/A;    ESOPHAGOGASTRODUODENOSCOPY     INSERT / REPLACE / REMOVE PACEMAKER     KNEE SURGERY     bilateral knee    [name] WITHOUT CARDIOVERSION N/A 05/24/2016   Procedure: TRANSESOPHAGEAL ECHOCARDIOGRAM ([name]);  Surgeon: Gaye Pollack, MD;  Location: [facility];  Service: Open Heart Surgery;  Laterality: N/A;   VASECTOMY      Social History   Socioeconomic History   Marital status: Married    Spouse name: Corrin Hingle   Number of children: 2   Years of education: Not on file   Highest education level: Not on file  Occupational History   Not on file  Tobacco Use   Smoking status: Former    Packs/day: 1.00    Years: 25.00    Pack years: 25.00    Types: Cigarettes    Quit date: 05/19/2018    Years since quitting: 3.3   Smokeless tobacco: Never  Vaping Use   Vaping Use: Never used  Substance and Sexual Activity   Alcohol use: No    Comment: rare   Drug use: No   Sexual activity: Not Currently  Other Topics Concern   Not on file  Social History Narrative   Married.   Moved from [place].   Disabled.   Social Determinants of Health   Financial Resource Strain: Low Risk    Difficulty of Paying Living Expenses: Not hard at all  Food Insecurity: No Food Insecurity   Worried About Charity fundraiser in the Last Year: Never true   [place] in the Last Year: Never true  Transportation Needs: No Transportation Needs   Lack of Transportation (Medical): No   Lack of Transportation (Non-Medical): No  Physical Activity: Inactive   Days of Exercise per Week: 0 days   Minutes of Exercise per Session: 0 min  Stress: No Stress Concern Present   Feeling of Stress : Not at all  Social Connections: Socially Isolated   Frequency of Communication with Friends and Family: Never   Frequency of Social Gatherings with Friends and Family: Never   Attends Religious Services: Never   Marine scientist or Organizations: No   Attends Music therapist: Never   Marital Status:  Married  Human resources officer Violence: Not At Risk   Fear of Current or Ex-Partner: No   Emotionally Abused: No   Physically Abused: No   Sexually Abused: No    Family History  Problem Relation Age of Onset   Heart attack Mother 52   Hypertension Mother    Melanoma Mother    Heart attack Father 42   Hypertension Father    Hypertension Maternal Uncle  Heart disease Maternal Uncle    Heart disease Maternal Grandmother    Stroke Maternal Grandmother    Diabetes Neg Hx    Allergies  Allergen Reactions   Naproxen Other (See Comments), Anxiety and Rash    Causes hyperactivity causes hyperactivity   Morphine And Related Nausea And Vomiting    Morphine only   I? Current Facility-Administered Medications  Medication Dose Route Frequency Provider Last Rate Last Admin   acetaminophen (TYLENOL) tablet 650 mg  650 mg Oral Q6H PRN Imagene Sheller S, DO       Or   acetaminophen (TYLENOL) suppository 650 mg  650 mg Rectal Q6H PRN Imagene Sheller S, DO       aspirin EC tablet 81 mg  81 mg Oral Daily Imagene Sheller S, DO   81 mg at 09/07/21 0924   azithromycin (ZITHROMAX) tablet 500 mg  500 mg Oral Daily Benita Gutter, RPH   500 mg at 09/07/21 1741   cefTRIAXone (ROCEPHIN) 2 g in sodium chloride 0.9 % 100 mL IVPB  2 g Intravenous Q24H Imagene Sheller S, DO 200 mL/hr at 09/07/21 0542 2 g at 09/07/21 0542   clopidogrel (PLAVIX) tablet 75 mg  75 mg Oral Daily Leslee Home, DO   75 mg at 09/07/21 3291   colchicine tablet 0.6 mg  0.6 mg Oral Daily Imagene Sheller S, DO   0.6 mg at 09/07/21 0924   escitalopram (LEXAPRO) tablet 20 mg  20 mg Oral Daily Imagene Sheller S, DO   20 mg at 09/07/21 9166   ezetimibe (ZETIA) tablet 10 mg  10 mg Oral Daily Imagene Sheller S, DO   10 mg at 09/07/21 0600   gabapentin (NEURONTIN) tablet 1,200 mg  1,200 mg Oral BID Imagene Sheller S, DO   1,200 mg at 09/07/21 4599   gemfibrozil (LOPID) tablet 600 mg  600 mg Oral Daily Imagene Sheller S, DO   600 mg at 09/07/21 7741    heparin injection 5,000 Units  5,000 Units Subcutaneous Q8H Jennye Boroughs, MD       insulin aspart (novoLOG) injection 0-20 Units  0-20 Units Subcutaneous TID WC Imagene Sheller S, DO   7 Units at 09/07/21 1741   insulin aspart (novoLOG) injection 0-5 Units  0-5 Units Subcutaneous QHS Imagene Sheller S, DO   4 Units at 09/06/21 2148   insulin detemir (LEVEMIR) injection 15 Units  15 Units Subcutaneous BID Jennye Boroughs, MD   15 Units at 09/07/21 1406   methocarbamol (ROBAXIN) tablet 750 mg  750 mg Oral Q8H PRN Imagene Sheller S, DO       metoprolol succinate (TOPROL-XL) 24 hr tablet 50 mg  50 mg Oral Daily Anwar, Bonnee Quin S, DO   50 mg at 09/07/21 0925   nitroGLYCERIN (NITROSTAT) SL tablet 0.4 mg  0.4 mg Sublingual Q5 min PRN Imagene Sheller S, DO       omega-3 acid ethyl esters (LOVAZA) capsule 1 g  1 g Oral BID Imagene Sheller S, DO   1 g at 09/07/21 0924   ondansetron (ZOFRAN) injection 4 mg  4 mg Intravenous Q6H PRN Sharion Settler, NP   4 mg at 09/07/21 1741   oxyCODONE (Oxy IR/ROXICODONE) immediate release tablet 5 mg  5 mg Oral Q6H PRN Imagene Sheller S, DO   5 mg at 09/07/21 0925   pantoprazole (PROTONIX) EC tablet 40 mg  40 mg Oral Daily Jennye Boroughs, MD   40 mg at 09/07/21 1406   promethazine (PHENERGAN)  12.5 mg in sodium chloride 0.9 % 50 mL IVPB  12.5 mg Intravenous Q6H PRN Imagene Sheller S, DO 200 mL/hr at 09/07/21 0432 12.5 mg at 09/07/21 0432   rosuvastatin (CRESTOR) tablet 20 mg  20 mg Oral Daily Imagene Sheller S, DO   20 mg at 09/07/21 6712   sacubitril-valsartan (ENTRESTO) 49-51 mg per tablet  1 tablet Oral BID Imagene Sheller S, DO   1 tablet at 09/07/21 4580   spironolactone (ALDACTONE) tablet 25 mg  25 mg Oral Daily Imagene Sheller S, DO   25 mg at 09/07/21 9983   traZODone (DESYREL) tablet 200 mg  200 mg Oral QHS Imagene Sheller S, DO         Abtx:  Anti-infectives (From admission, onward)    Start     Dose/Rate Route Frequency Ordered Stop   09/07/21 1800  azithromycin (ZITHROMAX) tablet  500 mg        500 mg Oral Daily 09/07/21 0909 09/09/21 1759   09/07/21 0600  cefTRIAXone (ROCEPHIN) 2 g in sodium chloride 0.9 % 100 mL IVPB       Note to Pharmacy: When can I start? How long does cefepime dose last until   2 g 200 mL/hr over 30 Minutes Intravenous Every 24 hours 09/06/21 1902 09/17/21 0559   09/06/21 2000  azithromycin (ZITHROMAX) 500 mg in sodium chloride 0.9 % 250 mL IVPB  Status:  Discontinued        500 mg 250 mL/hr over 60 Minutes Intravenous Every 24 hours 09/06/21 1946 09/07/21 0909   09/06/21 1515  vancomycin (VANCOCIN) IVPB 1000 mg/200 mL premix        1,000 mg 200 mL/hr over 60 Minutes Intravenous  Once 09/06/21 1500 09/06/21 2042   09/06/21 1500  ceFEPIme (MAXIPIME) 2 g in sodium chloride 0.9 % 100 mL IVPB        2 g 200 mL/hr over 30 Minutes Intravenous  Once 09/06/21 1452 09/06/21 1630   09/06/21 1500  metroNIDAZOLE (FLAGYL) IVPB 500 mg        500 mg 100 mL/hr over 60 Minutes Intravenous  Once 09/06/21 1452 09/06/21 1821   09/06/21 1500  vancomycin (VANCOCIN) IVPB 1000 mg/200 mL premix        1,000 mg 200 mL/hr over 60 Minutes Intravenous  Once 09/06/21 1452 09/06/21 2133       REVIEW OF SYSTEMS:  Const: negative fever, negative chills, negative weight loss Eyes: negative diplopia or visual changes, negative eye pain ENT: negative coryza, negative sore throat Resp:  cough,no  hemoptysis, has dyspnea Cards: negative for chest pain, palpitations, lower extremity edema GU: negative for frequency, dysuria and hematuria GI: Negative for abdominal pain, has diarrhea, bleeding, constipation Skin: negative for rash and pruritus Heme: negative for easy bruising and gum/nose bleeding MS:  myalgias, arthralgias, back pain and muscle weakness Neurolo:negative for headaches, dizziness, vertigo, memory problems  Psych: negative for feelings of anxiety, depression  Endocrine: , diabetes Allergy/Immunology- as above: Objective:  VITALS:  BP 93/64 (BP Location:  Right Arm)    Pulse 87    Temp 98.9 F (37.2 C) (Oral)    Resp 20    Ht 6' (1.829 m)    Wt 122.5 kg    SpO2 99%    BMI 36.62 kg/m  PHYSICAL EXAM:  General: lying in bed with eyes closed- uncomfortable but responds to questions appropriately  Head: Normocephalic, without obvious abnormality, atraumatic. Eyes: Conjunctivae clear, anicteric sclerae. Pupils are equal ENT Nares normal.  No drainage or sinus tenderness. Lips, mucosa, and tongue dry Neck:  symmetrical, no adenopathy, thyroid: non tender no carotid bruit and no JVD. Back: No CVA tenderness. Lungs: Clear to auscultation bilaterally. No Wheezing or Rhonchi. No rales. Heart: Regular rate and rhythm, no murmur, rub or gallop.sternal scar- AICD in place Abdomen: Soft, non-tender,not distended. Bowel sounds normal. No masses Extremities: rt knee not swollen- movement almost full No erythema atraumatic, no cyanosis. No edema. No clubbing Skin:over the sacrum area there is hyperpigmentation  No rashes or lesions. Or bruising Lymph: Cervical, supraclavicular normal. Neurologic: Grossly non-focal Pertinent Labs Lab Results CBC    Component Value Date/Time   WBC 4.1 09/07/2021 0529   RBC 2.99 (L) 09/07/2021 0529   HGB 9.3 (L) 09/07/2021 0529   HGB 12.1 (L) 05/04/2016 1200   HCT 26.4 (L) 09/07/2021 0529   HCT 36.5 (L) 05/04/2016 1200   PLT 95 (L) 09/07/2021 0529   PLT 202 05/04/2016 1200   MCV 88.3 09/07/2021 0529   MCV 94 05/04/2016 1200   MCH 31.1 09/07/2021 0529   MCHC 35.2 09/07/2021 0529   RDW 14.2 09/07/2021 0529   RDW 14.9 05/04/2016 1200   LYMPHSABS 1.0 09/06/2021 0937   LYMPHSABS 2.2 05/04/2016 1200   MONOABS 0.4 09/06/2021 0937   EOSABS 0.0 09/06/2021 0937   EOSABS 0.3 05/04/2016 1200   BASOSABS 0.0 09/06/2021 0937   BASOSABS 0.0 05/04/2016 1200    CMP Latest Ref Rng & Units 09/07/2021 09/06/2021 07/13/2021  Glucose 70 - 99 mg/dL 308(H) 365(H) 102(H)  BUN 6 - 20 mg/dL 25(H) 27(H) 8  Creatinine 0.61 - 1.24  mg/dL 0.80 0.83 0.77  Sodium 135 - 145 mmol/L 128(L) 124(L) 137  Potassium 3.5 - 5.1 mmol/L 3.6 3.8 4.1  Chloride 98 - 111 mmol/L 100 94(L) 105  CO2 22 - 32 mmol/L 20(L) 18(L) 24  Calcium 8.9 - 10.3 mg/dL 8.7(L) 8.9 9.6  Total Protein 6.5 - 8.1 g/dL - 7.2 7.3  Total Bilirubin 0.3 - 1.2 mg/dL - 0.8 0.3  Alkaline Phos 38 - 126 U/L - 45 44  AST 15 - 41 U/L - 25 26  ALT 0 - 44 U/L - 21 28      Microbiology: Recent Results (from the past 240 hour(s))  Blood culture (single)     Status: None (Preliminary result)   Collection Time: 09/06/21  9:38 AM   Specimen: BLOOD  Result Value Ref Range Status   Specimen Description BLOOD LEFT HAND  Final   Special Requests   Final    BOTTLES DRAWN AEROBIC AND ANAEROBIC Blood Culture results may not be optimal due to an excessive volume of blood received in culture bottles   Culture  Setup Time   Final    Organism ID to follow GRAM NEGATIVE RODS IN BOTH AEROBIC AND ANAEROBIC BOTTLES CRITICAL RESULT CALLED TO, READ BACK BY AND VERIFIED WITH: CARISSA DOLAN @ 2146 ON 09/06/21.Marland KitchenMarland KitchenTKR Performed at [facility], [address] [place], [place] [address]    Culture GRAM NEGATIVE RODS  Final   Report Status PENDING  Incomplete  Blood Culture ID Panel (Reflexed)     Status: Abnormal   Collection Time: 09/06/21  9:38 AM  Result Value Ref Range Status   Enterococcus faecalis NOT DETECTED NOT DETECTED Final   Enterococcus Faecium NOT DETECTED NOT DETECTED Final   Listeria monocytogenes NOT DETECTED NOT DETECTED Final   Staphylococcus species NOT DETECTED NOT DETECTED Final   Staphylococcus aureus (BCID) NOT DETECTED NOT  DETECTED Final   Staphylococcus epidermidis NOT DETECTED NOT DETECTED Final   Staphylococcus lugdunensis NOT DETECTED NOT DETECTED Final   Streptococcus species NOT DETECTED NOT DETECTED Final   Streptococcus agalactiae NOT DETECTED NOT DETECTED Final   Streptococcus pneumoniae NOT DETECTED NOT DETECTED Final   Streptococcus  pyogenes NOT DETECTED NOT DETECTED Final   A.calcoaceticus-baumannii NOT DETECTED NOT DETECTED Final   Bacteroides fragilis NOT DETECTED NOT DETECTED Final   Enterobacterales DETECTED (A) NOT DETECTED Final    Comment: Enterobacterales represent a large order of gram negative bacteria, not a single organism. CRITICAL RESULT CALLED TO, READ BACK BY AND VERIFIED WITH: CARISSA DOLAN @ 2146 ON 09/06/21.Marland KitchenMarland KitchenTKR    Enterobacter cloacae complex NOT DETECTED NOT DETECTED Final   Escherichia coli NOT DETECTED NOT DETECTED Final   Klebsiella aerogenes NOT DETECTED NOT DETECTED Final   Klebsiella oxytoca NOT DETECTED NOT DETECTED Final   Klebsiella pneumoniae DETECTED (A) NOT DETECTED Final    Comment: CRITICAL RESULT CALLED TO, READ BACK BY AND VERIFIED WITH: CARISSA DOLAN @ 2146 ON 09/06/21.Marland KitchenMarland KitchenTKR    Proteus species NOT DETECTED NOT DETECTED Final   Salmonella species NOT DETECTED NOT DETECTED Final   Serratia marcescens NOT DETECTED NOT DETECTED Final   Haemophilus influenzae NOT DETECTED NOT DETECTED Final   Neisseria meningitidis NOT DETECTED NOT DETECTED Final   Pseudomonas aeruginosa NOT DETECTED NOT DETECTED Final   Stenotrophomonas maltophilia NOT DETECTED NOT DETECTED Final   Candida albicans NOT DETECTED NOT DETECTED Final   Candida auris NOT DETECTED NOT DETECTED Final   Candida glabrata NOT DETECTED NOT DETECTED Final   Candida krusei NOT DETECTED NOT DETECTED Final   Candida parapsilosis NOT DETECTED NOT DETECTED Final   Candida tropicalis NOT DETECTED NOT DETECTED Final   Cryptococcus neoformans/gattii NOT DETECTED NOT DETECTED Final   CTX-M ESBL NOT DETECTED NOT DETECTED Final   Carbapenem resistance IMP NOT DETECTED NOT DETECTED Final   Carbapenem resistance KPC NOT DETECTED NOT DETECTED Final   Carbapenem resistance NDM NOT DETECTED NOT DETECTED Final   Carbapenem resist OXA 48 LIKE NOT DETECTED NOT DETECTED Final   Carbapenem resistance VIM NOT DETECTED NOT DETECTED Final     Comment: Performed at [facility], [address] [place], [place] [address]  Resp Panel by RT-PCR (Flu A&B, Covid) Nasopharyngeal Swab     Status: None   Collection Time: 09/06/21  1:25 PM   Specimen: Nasopharyngeal Swab; Nasopharyngeal(NP) swabs in vial transport medium  Result Value Ref Range Status   SARS Coronavirus 2 by RT PCR NEGATIVE NEGATIVE Final    Comment: (NOTE) SARS-CoV-2 target nucleic acids are NOT DETECTED.  The SARS-CoV-2 RNA is generally detectable in upper respiratory specimens during the acute phase of infection. The lowest concentration of SARS-CoV-2 viral copies this assay can detect is 138 copies/mL. A negative result does not preclude SARS-Cov-2 infection and should not be used as the sole basis for treatment or other patient management decisions. A negative result may occur with  improper specimen collection/handling, submission of specimen other than nasopharyngeal swab, presence of viral mutation(s) within the areas targeted by this assay, and inadequate number of viral copies(<138 copies/mL). A negative result must be combined with clinical observations, patient history, and epidemiological information. The expected result is Negative.  Fact Sheet for Patients:  EntrepreneurPulse.com.au  Fact Sheet for Healthcare Providers:  IncredibleEmployment.be  This test is no t yet approved or cleared by the [place] FDA and  has been authorized for detection and/or diagnosis of  SARS-CoV-2 by FDA under an Emergency Use Authorization (EUA). This EUA will remain  in effect (meaning this test can be used) for the duration of the COVID-19 declaration under Section 564(b)(1) of the Act, 21 U.S.C.section 360bbb-3(b)(1), unless the authorization is terminated  or revoked sooner.       Influenza A by PCR NEGATIVE NEGATIVE Final   Influenza B by PCR NEGATIVE NEGATIVE Final    Comment: (NOTE) The Xpert Xpress  SARS-CoV-2/FLU/RSV plus assay is intended as an aid in the diagnosis of influenza from Nasopharyngeal swab specimens and should not be used as a sole basis for treatment. Nasal washings and aspirates are unacceptable for Xpert Xpress SARS-CoV-2/FLU/RSV testing.  Fact Sheet for Patients: EntrepreneurPulse.com.au  Fact Sheet for Healthcare Providers: IncredibleEmployment.be  This test is not yet approved or cleared by the [place] FDA and has been authorized for detection and/or diagnosis of SARS-CoV-2 by FDA under an Emergency Use Authorization (EUA). This EUA will remain in effect (meaning this test can be used) for the duration of the COVID-19 declaration under Section 564(b)(1) of the Act, 21 U.S.C. section 360bbb-3(b)(1), unless the authorization is terminated or revoked.  Performed at [facility], [address] [place], [place] [address]   Blood culture (single)     Status: None (Preliminary result)   Collection Time: 09/06/21  2:04 PM   Specimen: BLOOD  Result Value Ref Range Status   Specimen Description BLOOD BLOOD RIGHT HAND  Final   Special Requests   Final    BOTTLES DRAWN AEROBIC AND ANAEROBIC Blood Culture adequate volume   Culture  Setup Time   Final    GRAM NEGATIVE RODS IN BOTH AEROBIC AND ANAEROBIC BOTTLES CRITICAL VALUE NOTED.  VALUE IS CONSISTENT WITH PREVIOUSLY REPORTED AND CALLED VALUE. Performed at [facility], [address], [place], [place] [address]    Culture GRAM NEGATIVE RODS  Final   Report Status PENDING  Incomplete    IMAGING RESULTS: CXR Mild bilateral heterogeneous opacities, possibly due to pulmonary edema or atelectasis. I have personally reviewed the films ? CT  CT chest   Multifocal pneumonia, upper lobe predominant.   Left renal pyelonephritis. Mildly thick-walled bladder, correlate for cystitis.   Impression/Recommendation ?presenting with nausea, vomiting,  fatigue, body ache, rt knee pain  Klebsiella bacteremia- ? Source Ct abdomen shows left pyelonephritis eventhough patient has no urinary symptom But because of hyperglcemia it is highly likely Rt knee pain would raise concern for septic arthritis but no swelling currently , no erythema, movt fair- keep a close eye for effusion- then would need aspiration Continue ceftriaxone  Repeat blood culture   Cough and some sob- CT b/l infiltrate- multifocal- r/o CHF, Viral illness, ( kleb pneumonia is usully not multifocal) Influenza, COVID neg R/o other viruses- will get resp pcr  CAD/Ischemic cardiomyopathy and CHF EF 25% BNP only mildly elevated- troponin high Has AICD  Rt knee arthritis- gouty arthritis- started on colchicine Watch closely for septic arthritis ? ?DM- was on insulin but not taking it for a week  Gi symptoms- could be from pain meds he was taking at home- watch for gastritis  Anemia ___________________________________________________ Discussed with patient, requesting provider Note:  This document was prepared using Dragon voice recognition software and may include unintentional dictation errors.

## 2021-09-07 NOTE — Progress Notes (Signed)
PHARMACIST - PHYSICIAN COMMUNICATION  CONCERNING: Antibiotic IV to Oral Route Change Policy  RECOMMENDATION: This patient is receiving azithromycin by the intravenous route.  Based on criteria approved by the Pharmacy and Therapeutics Committee, the antibiotic(s) is/are being converted to the equivalent oral dose form(s).   DESCRIPTION: These criteria include: Patient being treated for a respiratory tract infection, urinary tract infection, cellulitis or clostridium difficile associated diarrhea if on metronidazole The patient is not neutropenic and does not exhibit a GI malabsorption state The patient is eating (either orally or via tube) and/or has been taking other orally administered medications for a least 24 hours The patient is improving clinically and has a Tmax < 100.5  If you have questions about this conversion, please contact the [facility]  09/07/21

## 2021-09-07 NOTE — Progress Notes (Signed)
*  PRELIMINARY RESULTS* Echocardiogram 2D Echocardiogram has been performed.  Sherrie Sport 09/07/2021, 12:54 PM

## 2021-09-08 ENCOUNTER — Encounter: Payer: Self-pay | Admitting: Family Medicine

## 2021-09-08 DIAGNOSIS — N390 Urinary tract infection, site not specified: Secondary | ICD-10-CM

## 2021-09-08 LAB — GASTROINTESTINAL PANEL BY PCR, STOOL (REPLACES STOOL CULTURE)

## 2021-09-08 LAB — CBC WITH DIFFERENTIAL/PLATELET
Abs Immature Granulocytes: 0.02 10*3/uL (ref 0.00–0.07)
Basophils Absolute: 0 10*3/uL (ref 0.0–0.1)
Basophils Relative: 0 %
Eosinophils Absolute: 0.1 10*3/uL (ref 0.0–0.5)
Eosinophils Relative: 1 %
HCT: 24.8 % — ABNORMAL LOW (ref 39.0–52.0)
Hemoglobin: 8.9 g/dL — ABNORMAL LOW (ref 13.0–17.0)
Immature Granulocytes: 0 %
Lymphocytes Relative: 26 %
Lymphs Abs: 1.2 10*3/uL (ref 0.7–4.0)
MCH: 31.6 pg (ref 26.0–34.0)
MCHC: 35.9 g/dL (ref 30.0–36.0)
MCV: 87.9 fL (ref 80.0–100.0)
Monocytes Absolute: 0.5 10*3/uL (ref 0.1–1.0)
Monocytes Relative: 10 %
Neutro Abs: 3 10*3/uL (ref 1.7–7.7)
Neutrophils Relative %: 63 %
Platelets: 125 10*3/uL — ABNORMAL LOW (ref 150–400)
RBC: 2.82 MIL/uL — ABNORMAL LOW (ref 4.22–5.81)
RDW: 14.2 % (ref 11.5–15.5)
WBC: 4.8 10*3/uL (ref 4.0–10.5)
nRBC: 0 % (ref 0.0–0.2)

## 2021-09-08 LAB — BASIC METABOLIC PANEL
Anion gap: 9 (ref 5–15)
BUN: 26 mg/dL — ABNORMAL HIGH (ref 6–20)
CO2: 21 mmol/L — ABNORMAL LOW (ref 22–32)
Calcium: 8.7 mg/dL — ABNORMAL LOW (ref 8.9–10.3)
Chloride: 102 mmol/L (ref 98–111)
Creatinine, Ser: 0.69 mg/dL (ref 0.61–1.24)
GFR, Estimated: >60 mL/min (ref >60)
Glucose, Bld: 171 mg/dL — ABNORMAL HIGH (ref 70–99)
Potassium: 3.2 mmol/L — ABNORMAL LOW (ref 3.5–5.1)
Sodium: 132 mmol/L — ABNORMAL LOW (ref 135–145)

## 2021-09-08 LAB — C DIFFICILE QUICK SCREEN W PCR REFLEX
C Diff antigen: NEGATIVE
C Diff interpretation: NOT DETECTED
C Diff toxin: NEGATIVE

## 2021-09-08 LAB — RESPIRATORY PANEL BY PCR

## 2021-09-08 LAB — PROCALCITONIN: Procalcitonin: 3 ng/mL

## 2021-09-08 LAB — GLUCOSE, CAPILLARY
Glucose-Capillary: 178 mg/dL — ABNORMAL HIGH (ref 70–99)
Glucose-Capillary: 186 mg/dL — ABNORMAL HIGH (ref 70–99)
Glucose-Capillary: 273 mg/dL — ABNORMAL HIGH (ref 70–99)
Glucose-Capillary: 289 mg/dL — ABNORMAL HIGH (ref 70–99)

## 2021-09-08 LAB — MAGNESIUM: Magnesium: 2.4 mg/dL (ref 1.7–2.4)

## 2021-09-08 LAB — PHOSPHORUS: Phosphorus: 2.5 mg/dL (ref 2.5–4.6)

## 2021-09-08 MED ORDER — SODIUM CHLORIDE 0.9 % IV SOLN: INTRAVENOUS | Status: DC | PRN

## 2021-09-08 MED ORDER — POTASSIUM CHLORIDE CRYS ER 20 MEQ PO TBCR
40.0000 meq | EXTENDED_RELEASE_TABLET | Freq: Once | ORAL | Status: AC
  Administered 2021-09-08: 08:00:00 40 meq via ORAL
  Filled 2021-09-08: qty 2

## 2021-09-08 MED ORDER — ENSURE MAX PROTEIN PO LIQD
11.0000 [oz_av] | Freq: Two times a day (BID) | ORAL | Status: DC
  Administered 2021-09-08 – 2021-09-09 (×2): 11 [oz_av] via ORAL
  Filled 2021-09-08: qty 330

## 2021-09-08 MED ORDER — PREDNISONE 20 MG PO TABS
40.0000 mg | ORAL_TABLET | Freq: Once | ORAL | Status: AC
  Administered 2021-09-08: 12:00:00 40 mg via ORAL
  Filled 2021-09-08: qty 2

## 2021-09-08 MED ORDER — ADULT MULTIVITAMIN W/MINERALS CH
1.0000 | ORAL_TABLET | Freq: Every day | ORAL | Status: DC
  Administered 2021-09-09: 09:00:00 1 via ORAL
  Filled 2021-09-08: qty 1

## 2021-09-08 NOTE — Progress Notes (Signed)
Inpatient Diabetes Program Recommendations  AACE/ADA: New Consensus Statement on Inpatient Glycemic Control   Target Ranges:  Prepandial:   less than 140 mg/dL      Peak postprandial:   less than 180 mg/dL (1-2 hours)      Critically ill patients:  140 - 180 mg/dL    Review of Glycemic Control  Latest Reference Range & Units 09/07/21 08:33 09/07/21 11:24 09/07/21 15:37 09/07/21 17:17 09/07/21 21:02 09/08/21 07:23  Glucose-Capillary 70 - 99 mg/dL 301 (H) 304 (H) 248 (H) 239 (H) 201 (H) 178 (H)   Diabetes history: DM2 Outpatient Diabetes medications:  Insulin pump with Humulin R U500 insulin, Ozempic 1 mg Qweek Current orders for Inpatient glycemic control: Levemir 15 units bid, Novolog 0-20 units TID with meals, Novolog 0-5 units QHS, Ozempic 2 mg Qweek  Inpatient Diabetes Program Recommendations:    -  Add Novolog 5 units tid meal coverage if eating >50% of meals  Thanks, Tama Headings RN, MSN, BC-ADM Inpatient Diabetes Coordinator Team Pager [phone] (8a-5p)

## 2021-09-08 NOTE — Progress Notes (Signed)
Date of Admission:  09/06/2021     ID: BLADYN TIPPS is a 60 y.o. male  Principal Problem:   Sepsis ([place]) Active Problems:   Acute pyelonephritis   Multifocal pneumonia   Bacteremia due to Klebsiella pneumoniae    Subjective: Doing better Does not feel as bad as beofre Cough better No fever Has some loose stools Rt knee pain same Medications:   aspirin EC  81 mg Oral Daily   azithromycin  500 mg Oral Daily   clopidogrel  75 mg Oral Daily   colchicine  0.6 mg Oral Daily   escitalopram  20 mg Oral Daily   ezetimibe  10 mg Oral Daily   gabapentin  1,200 mg Oral BID   gemfibrozil  600 mg Oral Daily   heparin injection (subcutaneous)  5,000 Units Subcutaneous Q8H   insulin aspart  0-20 Units Subcutaneous TID WC   insulin aspart  0-5 Units Subcutaneous QHS   insulin detemir  15 Units Subcutaneous BID   metoprolol succinate  50 mg Oral Daily   [START ON 09/09/2021] multivitamin with minerals  1 tablet Oral Daily   omega-3 acid ethyl esters  1 g Oral BID   pantoprazole  40 mg Oral Daily   Ensure Max Protein  11 oz Oral BID   rosuvastatin  20 mg Oral Daily   sacubitril-valsartan  1 tablet Oral BID   spironolactone  25 mg Oral Daily   traZODone  200 mg Oral QHS    Objective: Vital signs in last 24 hours: Temp:  [98.6 F (37 C)-98.8 F (37.1 C)] 98.6 F (37 C) (12/23 1518) Pulse Rate:  [69-90] 74 (12/23 1518) Resp:  [16-18] 18 (12/23 1518) BP: (94-109)/(48-72) 94/65 (12/23 1518) SpO2:  [95 %-99 %] 99 % (12/23 1518)  PHYSICAL EXAM:  General: Alert, cooperative, no distress, appears stated age. Pale. Increased BMI Head: Normocephalic, without obvious abnormality, atraumatic. Eyes: Conjunctivae clear, anicteric sclerae. Pupils are equal ENT Nares normal. No drainage or sinus tenderness. Lips, mucosa, and tongue normal. No Thrush Neck: Supple, symmetrical, no adenopathy, thyroid: non tender no carotid bruit and no JVD. Back: No CVA tenderness. Lungs: Clear to  auscultation bilaterally. No Wheezing or Rhonchi. No rales. Heart: Regular rate and rhythm, no murmur, rub or gallop. Abdomen: Soft, non-tender,not distended. Bowel sounds normal. No masses Extremities: rt knee no swelling/ erythema atraumatic, no cyanosis. No edema. No clubbing Skin: No rashes or lesions. Or bruising Lymph: Cervical, supraclavicular normal. Neurologic: Grossly non-focal  Lab Results Recent Labs    09/07/21 0529 09/08/21 0436  WBC 4.1 4.8  HGB 9.3* 8.9*  HCT 26.4* 24.8*  NA 128* 132*  K 3.6 3.2*  CL 100 102  CO2 20* 21*  BUN 25* 26*  CREATININE 0.80 0.69   Liver Panel Recent Labs    09/06/21 0937  PROT 7.2  ALBUMIN 3.6  AST 25  ALT 21  ALKPHOS 45  BILITOT 0.8   Sedimentation Rate Recent Labs    09/06/21 0937  ESRSEDRATE 106*     Microbiology: 12/21 [facility]- kelb pneumoniae 12/23 [facility]- NG 12/21 [facility]- kleb pneumo  Studies/Results: CT HEAD WO CONTRAST (5MM)  Result Date: 09/06/2021 CLINICAL DATA:  Altered mental status, nausea/vomiting EXAM: CT HEAD WITHOUT CONTRAST TECHNIQUE: Contiguous axial images were obtained from the base of the skull through the vertex without intravenous contrast. COMPARISON:  11/09/2015 FINDINGS: Brain: No evidence of acute infarction, hemorrhage, hydrocephalus, extra-axial collection or mass lesion/mass effect. Mild subcortical white matter and periventricular small  vessel ischemic changes. Vascular: Intracranial atherosclerosis. Skull: Normal. Negative for fracture or focal lesion. Sinuses/Orbits: The visualized paranasal sinuses are essentially clear. The mastoid air cells are unopacified. Other: None. IMPRESSION: No evidence of acute intracranial abnormality. Mild small vessel ischemic changes. Electronically Signed   By: Julian Hy M.D.   On: 09/06/2021 17:20   CT Angio Chest PE W and/or Wo Contrast  Result Date: 09/06/2021 CLINICAL DATA:  Shortness of breath, evaluate for PE. Right lower quadrant pain.  Nausea/vomiting x1 week. EXAM: CT ANGIOGRAPHY CHEST CT ABDOMEN AND PELVIS WITH CONTRAST TECHNIQUE: Multidetector CT imaging of the chest was performed using the standard protocol during bolus administration of intravenous contrast. Multiplanar CT image reconstructions and MIPs were obtained to evaluate the vascular anatomy. Multidetector CT imaging of the abdomen and pelvis was performed using the standard protocol during bolus administration of intravenous contrast. CONTRAST:  183mL OMNIPAQUE IOHEXOL 350 MG/ML SOLN COMPARISON:  Chest radiographs dated 09/06/2021. CT abdomen/pelvis dated 04/03/2021. FINDINGS: CTA CHEST FINDINGS Cardiovascular: Satisfactory opacification of the bilateral pulmonary arteries to the segmental level. No evidence of pulmonary embolism. Although not tailored for evaluation of the thoracic aorta, there is no evidence of thoracic aneurysm or dissection. Atherosclerotic calcification of the aortic arch Cardiomegaly.  No pericardial effusion. Three vessel coronary atherosclerosis. Postsurgical changes related to prior CABG. Left subclavian ICD. Mediastinum/Nodes: No suspicious mediastinal lymphadenopathy. Visualized thyroid is unremarkable. Lungs/Pleura: Mild patchy opacities in the lungs bilaterally, upper lobe predominant, favoring multifocal pneumonia. No suspicious pulmonary nodules. No pleural effusions or pneumothorax. Musculoskeletal: Mild degenerative changes of the mid/lower thoracic spine. Median sternotomy. Review of the MIP images confirms the above findings. CT ABDOMEN and PELVIS FINDINGS Hepatobiliary: Liver is notable for scattered subcentimeter cysts. Status post cholecystectomy. No intrahepatic or extrahepatic dilatation. Pancreas: Within normal limits. Spleen: Within normal limits. Adrenals/Urinary Tract: Adrenal glands are within normal limits. Heterogeneous enhancement of the posterior interpolar left kidney (series 5/image 45), masslike but new, favoring focal  pyelonephritis. Right kidney is within normal limits. No hydronephrosis. Mildly thick-walled bladder, although underdistended. Stomach/Bowel: Stomach is within normal limits. No evidence of bowel obstruction.  Duodenal diverticulum. Status post appendectomy. No colonic wall thickening or inflammatory changes. Vascular/Lymphatic: No evidence of abdominal aortic aneurysm. Atherosclerotic calcifications of the abdominal aorta and branch vessels. No suspicious abdominopelvic lymphadenopathy. Reproductive: Prostate is unremarkable, noting dystrophic calcifications. Other: No abdominopelvic ascites. Musculoskeletal: Mild degenerative changes of the lumbar spine. Review of the MIP images confirms the above findings. IMPRESSION: No evidence of pulmonary embolism. Multifocal pneumonia, upper lobe predominant. Left renal pyelonephritis. Mildly thick-walled bladder, correlate for cystitis. Prior appendectomy. Electronically Signed   By: Julian Hy M.D.   On: 09/06/2021 17:27   CT ABDOMEN PELVIS W CONTRAST  Result Date: 09/06/2021 CLINICAL DATA:  Shortness of breath, evaluate for PE. Right lower quadrant pain. Nausea/vomiting x1 week. EXAM: CT ANGIOGRAPHY CHEST CT ABDOMEN AND PELVIS WITH CONTRAST TECHNIQUE: Multidetector CT imaging of the chest was performed using the standard protocol during bolus administration of intravenous contrast. Multiplanar CT image reconstructions and MIPs were obtained to evaluate the vascular anatomy. Multidetector CT imaging of the abdomen and pelvis was performed using the standard protocol during bolus administration of intravenous contrast. CONTRAST:  188mL OMNIPAQUE IOHEXOL 350 MG/ML SOLN COMPARISON:  Chest radiographs dated 09/06/2021. CT abdomen/pelvis dated 04/03/2021. FINDINGS: CTA CHEST FINDINGS Cardiovascular: Satisfactory opacification of the bilateral pulmonary arteries to the segmental level. No evidence of pulmonary embolism. Although not tailored for evaluation of the  thoracic aorta, there is no evidence  of thoracic aneurysm or dissection. Atherosclerotic calcification of the aortic arch Cardiomegaly.  No pericardial effusion. Three vessel coronary atherosclerosis. Postsurgical changes related to prior CABG. Left subclavian ICD. Mediastinum/Nodes: No suspicious mediastinal lymphadenopathy. Visualized thyroid is unremarkable. Lungs/Pleura: Mild patchy opacities in the lungs bilaterally, upper lobe predominant, favoring multifocal pneumonia. No suspicious pulmonary nodules. No pleural effusions or pneumothorax. Musculoskeletal: Mild degenerative changes of the mid/lower thoracic spine. Median sternotomy. Review of the MIP images confirms the above findings. CT ABDOMEN and PELVIS FINDINGS Hepatobiliary: Liver is notable for scattered subcentimeter cysts. Status post cholecystectomy. No intrahepatic or extrahepatic dilatation. Pancreas: Within normal limits. Spleen: Within normal limits. Adrenals/Urinary Tract: Adrenal glands are within normal limits. Heterogeneous enhancement of the posterior interpolar left kidney (series 5/image 45), masslike but new, favoring focal pyelonephritis. Right kidney is within normal limits. No hydronephrosis. Mildly thick-walled bladder, although underdistended. Stomach/Bowel: Stomach is within normal limits. No evidence of bowel obstruction.  Duodenal diverticulum. Status post appendectomy. No colonic wall thickening or inflammatory changes. Vascular/Lymphatic: No evidence of abdominal aortic aneurysm. Atherosclerotic calcifications of the abdominal aorta and branch vessels. No suspicious abdominopelvic lymphadenopathy. Reproductive: Prostate is unremarkable, noting dystrophic calcifications. Other: No abdominopelvic ascites. Musculoskeletal: Mild degenerative changes of the lumbar spine. Review of the MIP images confirms the above findings. IMPRESSION: No evidence of pulmonary embolism. Multifocal pneumonia, upper lobe predominant. Left renal  pyelonephritis. Mildly thick-walled bladder, correlate for cystitis. Prior appendectomy. Electronically Signed   By: Julian Hy M.D.   On: 09/06/2021 17:27   US Venous Img Lower Unilateral Right (DVT)  Result Date: 09/06/2021 CLINICAL DATA:  Right knee pain. EXAM: RIGHT LOWER EXTREMITY VENOUS DOPPLER ULTRASOUND TECHNIQUE: Gray-scale sonography with graded compression, as well as color Doppler and duplex ultrasound were performed to evaluate the lower extremity deep venous systems from the level of the common femoral vein and including the common femoral, femoral, profunda femoral, popliteal and calf veins including the posterior tibial, peroneal and gastrocnemius veins when visible. The superficial great saphenous vein was also interrogated. Spectral Doppler was utilized to evaluate flow at rest and with distal augmentation maneuvers in the common femoral, femoral and popliteal veins. COMPARISON:  None. FINDINGS: Contralateral Common Femoral Vein: Respiratory phasicity is normal and symmetric with the symptomatic side. No evidence of thrombus. Normal compressibility. Common Femoral Vein: No evidence of thrombus. Normal compressibility, respiratory phasicity and response to augmentation. Saphenofemoral Junction: No evidence of thrombus. Normal compressibility and flow on color Doppler imaging. Profunda Femoral Vein: No evidence of thrombus. Normal compressibility and flow on color Doppler imaging. Femoral Vein: No evidence of thrombus. Normal compressibility, respiratory phasicity and response to augmentation. Popliteal Vein: No evidence of thrombus. Normal compressibility, respiratory phasicity and response to augmentation. Calf Veins: No evidence of thrombus. Normal compressibility and flow on color Doppler imaging. Superficial Great Saphenous Vein: No evidence of thrombus. Normal compressibility. Venous Reflux:  None. Other Findings: No evidence of superficial thrombophlebitis or abnormal fluid  collection. IMPRESSION: No evidence of right lower extremity deep venous thrombosis. Electronically Signed   By: Aletta Edouard M.D.   On: 09/06/2021 16:44   ECHOCARDIOGRAM COMPLETE  Result Date: 09/07/2021    ECHOCARDIOGRAM REPORT   Patient Name:   Brian Williams Date of Exam: 09/07/2021 Medical Rec #:  [id number]      Height:       72.0 in Accession #:    [id number]     Weight:       270.0 lb Date of Birth:  1960/11/24  BSA:          2.420 m Patient Age:    60 years       BP:           101/69 mmHg Patient Gender: M              HR:           80 bpm. Exam Location:  [facility] Procedure: 2D Echo, Color Doppler and Cardiac Doppler Indications:     Elevated troponin  History:         Patient has prior history of Echocardiogram examinations, most                  recent 05/24/2016. Cardiomyopathy, Previous Myocardial                  Infarction, AICD; Risk Factors:Hypertension.  Sonographer:     Sherrie Sport Referring Phys:  [place] Diagnosing Phys: Ida Rogue MD  Sonographer Comments: Technically challenging study due to limited acoustic windows, no apical window and no subcostal window. Pt leaning on right side ---could not lie flat of his back nor on left side for improved imaging windows due to pain. IMPRESSIONS  1. Left ventricular ejection fraction, by estimation, is 25 to 30%. The left ventricle has severely decreased function. The left ventricle demonstrates global hypokinesis. Unable to exclude regional wall motion abnormality. The left ventricular internal  cavity size was moderately dilated. Left ventricular diastolic parameters are indeterminate.  2. Right ventricular systolic function was not well visualized. Function appears to be depressed. The right ventricular size is normal.  3. The mitral valve was not well visualized. Mild mitral valve regurgitation.  4. The aortic valve is normal in structure. Aortic valve regurgitation is not visualized. No aortic stenosis is present.  5.  Challenging images FINDINGS  Left Ventricle: Left ventricular ejection fraction, by estimation, is 25 to 30%. The left ventricle has severely decreased function. The left ventricle demonstrates global hypokinesis. The left ventricular internal cavity size was moderately dilated. There is no left ventricular hypertrophy. Left ventricular diastolic parameters are indeterminate. Right Ventricle: The right ventricular size is normal. No increase in right ventricular wall thickness. Right ventricular systolic function was not well visualized. Left Atrium: Left atrial size was normal in size. Right Atrium: Right atrial size was normal in size. Pericardium: There is no evidence of pericardial effusion. Mitral Valve: The mitral valve was not well visualized. Mild mitral valve regurgitation. No evidence of mitral valve stenosis. Tricuspid Valve: The tricuspid valve is not well visualized. Tricuspid valve regurgitation is mild . No evidence of tricuspid stenosis. Aortic Valve: The aortic valve is normal in structure. Aortic valve regurgitation is not visualized. No aortic stenosis is present. Pulmonic Valve: The pulmonic valve was not well visualized. Pulmonic valve regurgitation is not visualized. No evidence of pulmonic stenosis. Aorta: The aortic root is normal in size and structure. Venous: The pulmonary veins were not well visualized. The inferior vena cava was not well visualized. IAS/Shunts: No atrial level shunt detected by color flow Doppler. Additional Comments: A device lead is visualized.  LEFT VENTRICLE PLAX 2D LVIDd:         6.00 cm LVIDs:         5.40 cm LV PW:         1.20 cm LV IVS:        0.90 cm LVOT diam:     2.00 cm LVOT Area:     3.14  cm  LEFT ATRIUM         Index LA diam:    4.50 cm 1.86 cm/m                        PULMONIC VALVE AORTA                 PV Vmax:        0.50 m/s Ao Root diam: 3.20 cm PV Vmean:       36.000 cm/s                       PV VTI:         0.090 m                       PV Peak  grad:   1.0 mmHg                       PV Mean grad:   1.0 mmHg                       RVOT Peak grad: 3 mmHg   SHUNTS Systemic Diam: 2.00 cm Pulmonic VTI:  0.136 m Ida Rogue MD Electronically signed by Ida Rogue MD Signature Date/Time: 09/07/2021/5:36:21 PM    Final      Assessment/Plan:  Klebsiella bacteremia with klebsiella complicated UTI with left pyelonephritis Will get post void bladder scan to look for residue On ceftriaxone Susceptibility still pending- if changing to oral antibiotic need to choose one with good bioavialbility and renal concentration  B/l multifocal pneumonia- resp virus panel neg So likely kleb-  Can DC azithromycin  Acute GOUT : on colchicine  Diarrhea could be from colchicine  cdiff ordered If no diarrhea  by 11pm then DC enteric precaution and  DC cdiff orders  CAD/ischemic cardiomyopathy and EF 25%  Anemia ? Cause  DM on insulin  Hyponatremia and hypokalemia  Discussed the management with patient and his nurses ID will follow him peripherally this weekend- call if needed

## 2021-09-08 NOTE — Care Management Important Message (Signed)
Important Message  Patient Details  Name: Brian Williams MRN: [id number] Date of Birth: 08-25-1961   Medicare Important Message Given:  N/A - LOS <3 / Initial given by admissions     [name] A [name] 09/08/2021, 9:29 AM

## 2021-09-08 NOTE — Progress Notes (Addendum)
Progress Note    Brian Williams  GLO:[id number] DOB: 11/07/1960  DOA: 09/06/2021 PCP: Pleas Koch, NP      Brief Narrative:    Medical records reviewed and are as summarized below:  Brian Williams is a 60 y.o. male with medical history significant for type II insulin-dependent diabetes mellitus, chronic systolic CHF/ischemic cardiomyopathy s/p AICD, CAD with coronary stent, right knee osteoarthritis with steroid injections in the past, recent gout diagnosis (right knee pain), diabetic neuropathy, OSA on CPAP, hypertension, hyperlipidemia, anxiety, GERD, obesity.  He presented to the hospital because of nausea, shortness of breath, cough, vomiting and diarrhea for about 1 week duration.  He had been taking hydrocodone and naproxen for right knee pain and was diagnosed as gout about a week prior to admission.      Assessment/Plan:   Principal Problem:   Sepsis ([place]) Active Problems:   Acute pyelonephritis   Multifocal pneumonia   Bacteremia due to Klebsiella pneumoniae   Body mass index is 36.62 kg/m.  (Obesity)  Sepsis secondary to acute pyelonephritis and multifocal pneumonia, Klebsiella pneumoniae bacteremia: Urine culture is growing gram-negative rods.  Continue empiric antibiotics.  Follow-up with ID for further recommendations.    IDDM with hyperglycemia: Continue Levemir.  Use NovoLog as needed for hyperglycemia.  He said he had not used his insulin pump since he got sick but prior to that he had been using it.  Hyponatremia: Improving.  Monitor BMP.  Hypokalemia: Replete potassium  Diarrhea, positive heme stools, recent nausea and vomiting: Check stool for C. difficile toxin and GI panel.  No overt GI bleeding.  H&H is stable.  Right knee pain, right knee osteoarthritis, recent diagnosis of gout in the right knee: Uric acid was 12.5.  Add prednisone.  Colchicine will be held if GI panel is negative.  Of note, diarrhea started before colchicine was  initiated.  Chronic systolic CHF/ischemic cardiomyopathy: EF was 20 to 25% in January 2022.  Continue Entresto, Aldactone and Toprol-XL.  He has an ICD in place  CAD with coronary stents: Continue aspirin, Plavix and rosuvastatin    Diet Order             Diet clear liquid Room service appropriate? Yes; Fluid consistency: Thin  Diet effective now                      Consultants: None  Procedures: None    Medications:    aspirin EC  81 mg Oral Daily   azithromycin  500 mg Oral Daily   clopidogrel  75 mg Oral Daily   colchicine  0.6 mg Oral Daily   escitalopram  20 mg Oral Daily   ezetimibe  10 mg Oral Daily   gabapentin  1,200 mg Oral BID   gemfibrozil  600 mg Oral Daily   heparin injection (subcutaneous)  5,000 Units Subcutaneous Q8H   insulin aspart  0-20 Units Subcutaneous TID WC   insulin aspart  0-5 Units Subcutaneous QHS   insulin detemir  15 Units Subcutaneous BID   metoprolol succinate  50 mg Oral Daily   omega-3 acid ethyl esters  1 g Oral BID   pantoprazole  40 mg Oral Daily   rosuvastatin  20 mg Oral Daily   sacubitril-valsartan  1 tablet Oral BID   spironolactone  25 mg Oral Daily   traZODone  200 mg Oral QHS   Continuous Infusions:  sodium chloride 10 mL/hr at 09/08/21 [phone]  cefTRIAXone (ROCEPHIN)  IV Stopped (09/08/21 0543)   promethazine (PHENERGAN) injection (IM or IVPB) 12.5 mg (09/07/21 0432)     Anti-infectives (From admission, onward)    Start     Dose/Rate Route Frequency Ordered Stop   09/07/21 1800  azithromycin (ZITHROMAX) tablet 500 mg        500 mg Oral Daily 09/07/21 0909 09/09/21 1759   09/07/21 0600  cefTRIAXone (ROCEPHIN) 2 g in sodium chloride 0.9 % 100 mL IVPB       Note to Pharmacy: When can I start? How long does cefepime dose last until   2 g 200 mL/hr over 30 Minutes Intravenous Every 24 hours 09/06/21 1902 09/17/21 0559   09/06/21 2000  azithromycin (ZITHROMAX) 500 mg in sodium chloride 0.9 % 250 mL IVPB   Status:  Discontinued        500 mg 250 mL/hr over 60 Minutes Intravenous Every 24 hours 09/06/21 1946 09/07/21 0909   09/06/21 1515  vancomycin (VANCOCIN) IVPB 1000 mg/200 mL premix        1,000 mg 200 mL/hr over 60 Minutes Intravenous  Once 09/06/21 1500 09/06/21 2042   09/06/21 1500  ceFEPIme (MAXIPIME) 2 g in sodium chloride 0.9 % 100 mL IVPB        2 g 200 mL/hr over 30 Minutes Intravenous  Once 09/06/21 1452 09/06/21 1630   09/06/21 1500  metroNIDAZOLE (FLAGYL) IVPB 500 mg        500 mg 100 mL/hr over 60 Minutes Intravenous  Once 09/06/21 1452 09/06/21 1821   09/06/21 1500  vancomycin (VANCOCIN) IVPB 1000 mg/200 mL premix        1,000 mg 200 mL/hr over 60 Minutes Intravenous  Once 09/06/21 1452 09/06/21 2133              Family Communication/Anticipated D/C date and plan/Code Status   DVT prophylaxis: heparin injection 5,000 Units Start: 09/07/21 2200 SCDs Start: 09/06/21 1933     Code Status: Full Code  Family Communication: None Disposition Plan: Possible discharge to home in 2 to 3 days   Status is: Inpatient  Remains inpatient appropriate because: IV antibiotic           Subjective:   He was more alert and communicative today.  He complains of diarrhea.  He said he has had diarrhea for about 7 days now.  He had 4 watery stools today.  His diarrhea started before he was started on colchicine by the orthopedic surgeon.  He also complains of nausea and severe pain in the right knee.  No vomiting, abdominal pain, bloody stool, chest pain or shortness of breath.  Objective:    Vitals:   09/07/21 1510 09/07/21 2107 09/08/21 0449 09/08/21 0724  BP: 93/64 109/72 104/70 (!) 99/48  Pulse: 87 69 90 80  Resp: 20 18 16 18   Temp: 98.9 F (37.2 C) 98.7 F (37.1 C) 98.8 F (37.1 C) 98.6 F (37 C)  TempSrc: Oral  Oral   SpO2: 99% 95% 95% 96%  Weight:      Height:       No data found.   Intake/Output Summary (Last 24 hours) at 09/08/2021 1335 Last  data filed at 09/08/2021 1300 Gross per 24 hour  Intake 635.11 ml  Output 750 ml  Net -114.89 ml   Filed Weights   09/06/21 0923  Weight: 122.5 kg    Exam:  GEN: NAD SKIN: Warm and dry EYES: EOMI ENT: MMM CV: RRR PULM: CTA B ABD:  soft, obese, NT, +BS CNS: AAO x 3, non focal EXT: No edema or tenderness in the legs.  Mild tenderness on the right knee but no erythema noted.  GU: No CVA tenderness         Data Reviewed:   I have personally reviewed following labs and imaging studies:  Labs: Labs show the following:   Basic Metabolic Panel: Recent Labs  Lab 09/06/21 0937 09/07/21 0529 09/08/21 0436  NA 124* 128* 132*  K 3.8 3.6 3.2*  CL 94* 100 102  CO2 18* 20* 21*  GLUCOSE 365* 308* 171*  BUN 27* 25* 26*  CREATININE 0.83 0.80 0.69  CALCIUM 8.9 8.7* 8.7*   GFR Estimated Creatinine Clearance: 132.8 mL/min (by C-G formula based on SCr of 0.69 mg/dL). Liver Function Tests: Recent Labs  Lab 09/06/21 0937  AST 25  ALT 21  ALKPHOS 45  BILITOT 0.8  PROT 7.2  ALBUMIN 3.6   No results for input(s): LIPASE, AMYLASE in the last 168 hours. No results for input(s): AMMONIA in the last 168 hours. Coagulation profile No results for input(s): INR, PROTIME in the last 168 hours.  CBC: Recent Labs  Lab 09/06/21 0937 09/07/21 0529 09/08/21 0436  WBC 5.2 4.1 4.8  NEUTROABS 3.7  --  3.0  HGB 9.5* 9.3* 8.9*  HCT 26.1* 26.4* 24.8*  MCV 87.9 88.3 87.9  PLT 134* 95* 125*   Cardiac Enzymes: No results for input(s): CKTOTAL, CKMB, CKMBINDEX, TROPONINI in the last 168 hours. BNP (last 3 results) No results for input(s): PROBNP in the last 8760 hours. CBG: Recent Labs  Lab 09/07/21 1537 09/07/21 1717 09/07/21 2102 09/08/21 0723 09/08/21 1147  GLUCAP 248* 239* 201* 178* 186*   D-Dimer: No results for input(s): DDIMER in the last 72 hours. Hgb A1c: Recent Labs    09/07/21 0529  HGBA1C 7.6*   Lipid Profile: No results for input(s): CHOL, HDL,  LDLCALC, TRIG, CHOLHDL, LDLDIRECT in the last 72 hours. Thyroid function studies: Recent Labs    09/07/21 0529  TSH 1.751   Anemia work up: No results for input(s): VITAMINB12, FOLATE, FERRITIN, TIBC, IRON, RETICCTPCT in the last 72 hours. Sepsis Labs: Recent Labs  Lab 09/06/21 0737 09/07/21 0529 09/08/21 0436  PROCALCITON 5.71 4.81 3.00  WBC 5.2 4.1 4.8  LATICACIDVEN 1.6  --   --     Microbiology Recent Results (from the past 240 hour(s))  Blood culture (single)     Status: Abnormal (Preliminary result)   Collection Time: 09/06/21  9:38 AM   Specimen: BLOOD  Result Value Ref Range Status   Specimen Description   Final    BLOOD LEFT HAND Performed at [facility], [address], [place], [place] [address]    Special Requests   Final    BOTTLES DRAWN AEROBIC AND ANAEROBIC Blood Culture results may not be optimal due to an excessive volume of blood received in culture bottles Performed at [facility], [address] [place], [place] [address]    Culture  Setup Time   Final    GRAM NEGATIVE RODS IN BOTH AEROBIC AND ANAEROBIC BOTTLES CRITICAL RESULT CALLED TO, READ BACK BY AND VERIFIED WITH: CARISSA DOLAN @ 2146 ON 09/06/21.Marland KitchenMarland KitchenTKR    Culture (A)  Final    KLEBSIELLA PNEUMONIAE SUSCEPTIBILITIES TO FOLLOW Performed at [facility], [place] [address], [place], [place] [address]    Report Status PENDING  Incomplete  Blood Culture ID Panel (Reflexed)     Status: Abnormal  Collection Time: 09/06/21  9:38 AM  Result Value Ref Range Status   Enterococcus faecalis NOT DETECTED NOT DETECTED Final   Enterococcus Faecium NOT DETECTED NOT DETECTED Final   Listeria monocytogenes NOT DETECTED NOT DETECTED Final   Staphylococcus species NOT DETECTED NOT DETECTED Final   Staphylococcus aureus (BCID) NOT DETECTED NOT DETECTED Final   Staphylococcus epidermidis NOT DETECTED NOT DETECTED Final   Staphylococcus lugdunensis NOT DETECTED NOT DETECTED Final    Streptococcus species NOT DETECTED NOT DETECTED Final   Streptococcus agalactiae NOT DETECTED NOT DETECTED Final   Streptococcus pneumoniae NOT DETECTED NOT DETECTED Final   Streptococcus pyogenes NOT DETECTED NOT DETECTED Final   A.calcoaceticus-baumannii NOT DETECTED NOT DETECTED Final   Bacteroides fragilis NOT DETECTED NOT DETECTED Final   Enterobacterales DETECTED (A) NOT DETECTED Final    Comment: Enterobacterales represent a large order of gram negative bacteria, not a single organism. CRITICAL RESULT CALLED TO, READ BACK BY AND VERIFIED WITH: CARISSA DOLAN @ 2146 ON 09/06/21.Marland KitchenMarland KitchenTKR    Enterobacter cloacae complex NOT DETECTED NOT DETECTED Final   Escherichia coli NOT DETECTED NOT DETECTED Final   Klebsiella aerogenes NOT DETECTED NOT DETECTED Final   Klebsiella oxytoca NOT DETECTED NOT DETECTED Final   Klebsiella pneumoniae DETECTED (A) NOT DETECTED Final    Comment: CRITICAL RESULT CALLED TO, READ BACK BY AND VERIFIED WITH: CARISSA DOLAN @ 2146 ON 09/06/21.Marland KitchenMarland KitchenTKR    Proteus species NOT DETECTED NOT DETECTED Final   Salmonella species NOT DETECTED NOT DETECTED Final   Serratia marcescens NOT DETECTED NOT DETECTED Final   Haemophilus influenzae NOT DETECTED NOT DETECTED Final   Neisseria meningitidis NOT DETECTED NOT DETECTED Final   Pseudomonas aeruginosa NOT DETECTED NOT DETECTED Final   Stenotrophomonas maltophilia NOT DETECTED NOT DETECTED Final   Candida albicans NOT DETECTED NOT DETECTED Final   Candida auris NOT DETECTED NOT DETECTED Final   Candida glabrata NOT DETECTED NOT DETECTED Final   Candida krusei NOT DETECTED NOT DETECTED Final   Candida parapsilosis NOT DETECTED NOT DETECTED Final   Candida tropicalis NOT DETECTED NOT DETECTED Final   Cryptococcus neoformans/gattii NOT DETECTED NOT DETECTED Final   CTX-M ESBL NOT DETECTED NOT DETECTED Final   Carbapenem resistance IMP NOT DETECTED NOT DETECTED Final   Carbapenem resistance KPC NOT DETECTED NOT DETECTED Final    Carbapenem resistance NDM NOT DETECTED NOT DETECTED Final   Carbapenem resist OXA 48 LIKE NOT DETECTED NOT DETECTED Final   Carbapenem resistance VIM NOT DETECTED NOT DETECTED Final    Comment: Performed at [facility], [address] [place], [place] [address]  Resp Panel by RT-PCR (Flu A&B, Covid) Nasopharyngeal Swab     Status: None   Collection Time: 09/06/21  1:25 PM   Specimen: Nasopharyngeal Swab; Nasopharyngeal(NP) swabs in vial transport medium  Result Value Ref Range Status   SARS Coronavirus 2 by RT PCR NEGATIVE NEGATIVE Final    Comment: (NOTE) SARS-CoV-2 target nucleic acids are NOT DETECTED.  The SARS-CoV-2 RNA is generally detectable in upper respiratory specimens during the acute phase of infection. The lowest concentration of SARS-CoV-2 viral copies this assay can detect is 138 copies/mL. A negative result does not preclude SARS-Cov-2 infection and should not be used as the sole basis for treatment or other patient management decisions. A negative result may occur with  improper specimen collection/handling, submission of specimen other than nasopharyngeal swab, presence of viral mutation(s) within the areas targeted by this assay, and inadequate number of viral copies(<138 copies/mL). A negative result  must be combined with clinical observations, patient history, and epidemiological information. The expected result is Negative.  Fact Sheet for Patients:  EntrepreneurPulse.com.au  Fact Sheet for Healthcare Providers:  IncredibleEmployment.be  This test is no t yet approved or cleared by the [place] FDA and  has been authorized for detection and/or diagnosis of SARS-CoV-2 by FDA under an Emergency Use Authorization (EUA). This EUA will remain  in effect (meaning this test can be used) for the duration of the COVID-19 declaration under Section 564(b)(1) of the Act, 21 U.S.C.section 360bbb-3(b)(1), unless the  authorization is terminated  or revoked sooner.       Influenza A by PCR NEGATIVE NEGATIVE Final   Influenza B by PCR NEGATIVE NEGATIVE Final    Comment: (NOTE) The Xpert Xpress SARS-CoV-2/FLU/RSV plus assay is intended as an aid in the diagnosis of influenza from Nasopharyngeal swab specimens and should not be used as a sole basis for treatment. Nasal washings and aspirates are unacceptable for Xpert Xpress SARS-CoV-2/FLU/RSV testing.  Fact Sheet for Patients: EntrepreneurPulse.com.au  Fact Sheet for Healthcare Providers: IncredibleEmployment.be  This test is not yet approved or cleared by the [place] FDA and has been authorized for detection and/or diagnosis of SARS-CoV-2 by FDA under an Emergency Use Authorization (EUA). This EUA will remain in effect (meaning this test can be used) for the duration of the COVID-19 declaration under Section 564(b)(1) of the Act, 21 U.S.C. section 360bbb-3(b)(1), unless the authorization is terminated or revoked.  Performed at [facility], [address] [place], [place] [address]   Blood culture (single)     Status: Abnormal (Preliminary result)   Collection Time: 09/06/21  2:04 PM   Specimen: BLOOD  Result Value Ref Range Status   Specimen Description   Final    BLOOD BLOOD RIGHT HAND Performed at [facility], [address], [place], [place] [address]    Special Requests   Final    BOTTLES DRAWN AEROBIC AND ANAEROBIC Blood Culture adequate volume Performed at [facility], [address] [place], [place] [address]    Culture  Setup Time   Final    GRAM NEGATIVE RODS IN BOTH AEROBIC AND ANAEROBIC BOTTLES CRITICAL VALUE NOTED.  VALUE IS CONSISTENT WITH PREVIOUSLY REPORTED AND CALLED VALUE. Performed at [facility], [address], [place], [place] [address]    Culture KLEBSIELLA PNEUMONIAE (A)  Final   Report Status PENDING  Incomplete  Urine  Culture     Status: Abnormal (Preliminary result)   Collection Time: 09/06/21  3:04 PM   Specimen: Urine, Random  Result Value Ref Range Status   Specimen Description   Final    URINE, RANDOM Performed at [facility], [address], [place], [place] [address]    Special Requests   Final    NONE Performed at [facility], [address], [place],  [address]    Culture (A)  Final    >=100,000 COLONIES/mL GRAM NEGATIVE RODS SUSCEPTIBILITIES TO FOLLOW CULTURE REINCUBATED FOR BETTER GROWTH Performed at [facility], [place] [address] [place],  [address]    Report Status PENDING  Incomplete  Respiratory (~20 pathogens) panel by PCR     Status: None   Collection Time: 09/08/21  1:56 AM   Specimen: Nasopharyngeal Swab; Respiratory  Result Value Ref Range Status   Adenovirus NOT DETECTED NOT DETECTED Final   Coronavirus 229E NOT DETECTED NOT DETECTED Final    Comment: (NOTE) The Coronavirus on  the Respiratory Panel, DOES NOT test for the novel  Coronavirus (2019 nCoV)    Coronavirus HKU1 NOT DETECTED NOT DETECTED Final   Coronavirus NL63 NOT DETECTED NOT DETECTED Final   Coronavirus OC43 NOT DETECTED NOT DETECTED Final   Metapneumovirus NOT DETECTED NOT DETECTED Final   Rhinovirus / Enterovirus NOT DETECTED NOT DETECTED Final   Influenza A NOT DETECTED NOT DETECTED Final   Influenza B NOT DETECTED NOT DETECTED Final   Parainfluenza Virus 1 NOT DETECTED NOT DETECTED Final   Parainfluenza Virus 2 NOT DETECTED NOT DETECTED Final   Parainfluenza Virus 3 NOT DETECTED NOT DETECTED Final   Parainfluenza Virus 4 NOT DETECTED NOT DETECTED Final   Respiratory Syncytial Virus NOT DETECTED NOT DETECTED Final   Bordetella pertussis NOT DETECTED NOT DETECTED Final   Bordetella Parapertussis NOT DETECTED NOT DETECTED Final   Chlamydophila pneumoniae NOT DETECTED NOT DETECTED Final   Mycoplasma pneumoniae NOT DETECTED NOT DETECTED Final    Comment:  Performed at [facility], [place] [address], [place], [place] [address]    Procedures and diagnostic studies:  DG Chest 2 View  Result Date: 09/06/2021 CLINICAL DATA:  Shortness of breath EXAM: CHEST - 2 VIEW COMPARISON:  Chest x-ray dated December 06, 2020 FINDINGS: Cardiac and mediastinal contours are unchanged post median sternotomy. Left chest wall cardiac conduction device with unchanged lead position. Mild bilateral heterogeneous opacities. No large pleural effusion or pneumothorax. IMPRESSION: Mild bilateral heterogeneous opacities, possibly due to pulmonary edema or atelectasis. Electronically Signed   By: Yetta Glassman M.D.   On: 09/06/2021 15:40   CT HEAD WO CONTRAST (5MM)  Result Date: 09/06/2021 CLINICAL DATA:  Altered mental status, nausea/vomiting EXAM: CT HEAD WITHOUT CONTRAST TECHNIQUE: Contiguous axial images were obtained from the base of the skull through the vertex without intravenous contrast. COMPARISON:  11/09/2015 FINDINGS: Brain: No evidence of acute infarction, hemorrhage, hydrocephalus, extra-axial collection or mass lesion/mass effect. Mild subcortical white matter and periventricular small vessel ischemic changes. Vascular: Intracranial atherosclerosis. Skull: Normal. Negative for fracture or focal lesion. Sinuses/Orbits: The visualized paranasal sinuses are essentially clear. The mastoid air cells are unopacified. Other: None. IMPRESSION: No evidence of acute intracranial abnormality. Mild small vessel ischemic changes. Electronically Signed   By: Julian Hy M.D.   On: 09/06/2021 17:20   CT Angio Chest PE W and/or Wo Contrast  Result Date: 09/06/2021 CLINICAL DATA:  Shortness of breath, evaluate for PE. Right lower quadrant pain. Nausea/vomiting x1 week. EXAM: CT ANGIOGRAPHY CHEST CT ABDOMEN AND PELVIS WITH CONTRAST TECHNIQUE: Multidetector CT imaging of the chest was performed using the standard protocol during bolus administration of intravenous contrast.  Multiplanar CT image reconstructions and MIPs were obtained to evaluate the vascular anatomy. Multidetector CT imaging of the abdomen and pelvis was performed using the standard protocol during bolus administration of intravenous contrast. CONTRAST:  142mL OMNIPAQUE IOHEXOL 350 MG/ML SOLN COMPARISON:  Chest radiographs dated 09/06/2021. CT abdomen/pelvis dated 04/03/2021. FINDINGS: CTA CHEST FINDINGS Cardiovascular: Satisfactory opacification of the bilateral pulmonary arteries to the segmental level. No evidence of pulmonary embolism. Although not tailored for evaluation of the thoracic aorta, there is no evidence of thoracic aneurysm or dissection. Atherosclerotic calcification of the aortic arch Cardiomegaly.  No pericardial effusion. Three vessel coronary atherosclerosis. Postsurgical changes related to prior CABG. Left subclavian ICD. Mediastinum/Nodes: No suspicious mediastinal lymphadenopathy. Visualized thyroid is unremarkable. Lungs/Pleura: Mild patchy opacities in the lungs bilaterally, upper lobe predominant, favoring multifocal pneumonia. No suspicious pulmonary nodules. No pleural effusions or pneumothorax.  Musculoskeletal: Mild degenerative changes of the mid/lower thoracic spine. Median sternotomy. Review of the MIP images confirms the above findings. CT ABDOMEN and PELVIS FINDINGS Hepatobiliary: Liver is notable for scattered subcentimeter cysts. Status post cholecystectomy. No intrahepatic or extrahepatic dilatation. Pancreas: Within normal limits. Spleen: Within normal limits. Adrenals/Urinary Tract: Adrenal glands are within normal limits. Heterogeneous enhancement of the posterior interpolar left kidney (series 5/image 45), masslike but new, favoring focal pyelonephritis. Right kidney is within normal limits. No hydronephrosis. Mildly thick-walled bladder, although underdistended. Stomach/Bowel: Stomach is within normal limits. No evidence of bowel obstruction.  Duodenal diverticulum. Status post  appendectomy. No colonic wall thickening or inflammatory changes. Vascular/Lymphatic: No evidence of abdominal aortic aneurysm. Atherosclerotic calcifications of the abdominal aorta and branch vessels. No suspicious abdominopelvic lymphadenopathy. Reproductive: Prostate is unremarkable, noting dystrophic calcifications. Other: No abdominopelvic ascites. Musculoskeletal: Mild degenerative changes of the lumbar spine. Review of the MIP images confirms the above findings. IMPRESSION: No evidence of pulmonary embolism. Multifocal pneumonia, upper lobe predominant. Left renal pyelonephritis. Mildly thick-walled bladder, correlate for cystitis. Prior appendectomy. Electronically Signed   By: Julian Hy M.D.   On: 09/06/2021 17:27   CT ABDOMEN PELVIS W CONTRAST  Result Date: 09/06/2021 CLINICAL DATA:  Shortness of breath, evaluate for PE. Right lower quadrant pain. Nausea/vomiting x1 week. EXAM: CT ANGIOGRAPHY CHEST CT ABDOMEN AND PELVIS WITH CONTRAST TECHNIQUE: Multidetector CT imaging of the chest was performed using the standard protocol during bolus administration of intravenous contrast. Multiplanar CT image reconstructions and MIPs were obtained to evaluate the vascular anatomy. Multidetector CT imaging of the abdomen and pelvis was performed using the standard protocol during bolus administration of intravenous contrast. CONTRAST:  165mL OMNIPAQUE IOHEXOL 350 MG/ML SOLN COMPARISON:  Chest radiographs dated 09/06/2021. CT abdomen/pelvis dated 04/03/2021. FINDINGS: CTA CHEST FINDINGS Cardiovascular: Satisfactory opacification of the bilateral pulmonary arteries to the segmental level. No evidence of pulmonary embolism. Although not tailored for evaluation of the thoracic aorta, there is no evidence of thoracic aneurysm or dissection. Atherosclerotic calcification of the aortic arch Cardiomegaly.  No pericardial effusion. Three vessel coronary atherosclerosis. Postsurgical changes related to prior CABG.  Left subclavian ICD. Mediastinum/Nodes: No suspicious mediastinal lymphadenopathy. Visualized thyroid is unremarkable. Lungs/Pleura: Mild patchy opacities in the lungs bilaterally, upper lobe predominant, favoring multifocal pneumonia. No suspicious pulmonary nodules. No pleural effusions or pneumothorax. Musculoskeletal: Mild degenerative changes of the mid/lower thoracic spine. Median sternotomy. Review of the MIP images confirms the above findings. CT ABDOMEN and PELVIS FINDINGS Hepatobiliary: Liver is notable for scattered subcentimeter cysts. Status post cholecystectomy. No intrahepatic or extrahepatic dilatation. Pancreas: Within normal limits. Spleen: Within normal limits. Adrenals/Urinary Tract: Adrenal glands are within normal limits. Heterogeneous enhancement of the posterior interpolar left kidney (series 5/image 45), masslike but new, favoring focal pyelonephritis. Right kidney is within normal limits. No hydronephrosis. Mildly thick-walled bladder, although underdistended. Stomach/Bowel: Stomach is within normal limits. No evidence of bowel obstruction.  Duodenal diverticulum. Status post appendectomy. No colonic wall thickening or inflammatory changes. Vascular/Lymphatic: No evidence of abdominal aortic aneurysm. Atherosclerotic calcifications of the abdominal aorta and branch vessels. No suspicious abdominopelvic lymphadenopathy. Reproductive: Prostate is unremarkable, noting dystrophic calcifications. Other: No abdominopelvic ascites. Musculoskeletal: Mild degenerative changes of the lumbar spine. Review of the MIP images confirms the above findings. IMPRESSION: No evidence of pulmonary embolism. Multifocal pneumonia, upper lobe predominant. Left renal pyelonephritis. Mildly thick-walled bladder, correlate for cystitis. Prior appendectomy. Electronically Signed   By: Julian Hy M.D.   On: 09/06/2021 17:27   US Venous Img Lower  Unilateral Right (DVT)  Result Date: 09/06/2021 CLINICAL DATA:   Right knee pain. EXAM: RIGHT LOWER EXTREMITY VENOUS DOPPLER ULTRASOUND TECHNIQUE: Gray-scale sonography with graded compression, as well as color Doppler and duplex ultrasound were performed to evaluate the lower extremity deep venous systems from the level of the common femoral vein and including the common femoral, femoral, profunda femoral, popliteal and calf veins including the posterior tibial, peroneal and gastrocnemius veins when visible. The superficial great saphenous vein was also interrogated. Spectral Doppler was utilized to evaluate flow at rest and with distal augmentation maneuvers in the common femoral, femoral and popliteal veins. COMPARISON:  None. FINDINGS: Contralateral Common Femoral Vein: Respiratory phasicity is normal and symmetric with the symptomatic side. No evidence of thrombus. Normal compressibility. Common Femoral Vein: No evidence of thrombus. Normal compressibility, respiratory phasicity and response to augmentation. Saphenofemoral Junction: No evidence of thrombus. Normal compressibility and flow on color Doppler imaging. Profunda Femoral Vein: No evidence of thrombus. Normal compressibility and flow on color Doppler imaging. Femoral Vein: No evidence of thrombus. Normal compressibility, respiratory phasicity and response to augmentation. Popliteal Vein: No evidence of thrombus. Normal compressibility, respiratory phasicity and response to augmentation. Calf Veins: No evidence of thrombus. Normal compressibility and flow on color Doppler imaging. Superficial Great Saphenous Vein: No evidence of thrombus. Normal compressibility. Venous Reflux:  None. Other Findings: No evidence of superficial thrombophlebitis or abnormal fluid collection. IMPRESSION: No evidence of right lower extremity deep venous thrombosis. Electronically Signed   By: Aletta Edouard M.D.   On: 09/06/2021 16:44   DG Knee Complete 4 Views Right  Result Date: 09/06/2021 CLINICAL DATA:  A 60 year old male  presents for evaluation of knee pain. EXAM: RIGHT KNEE - COMPLETE 4+ VIEW COMPARISON:  August 28, 2021. FINDINGS: Tricompartmental osteoarthritic changes greatest in the lateral and patellofemoral compartments are similar to recent imaging. Osteopenia. No signs of fracture or dislocation. Soft tissues are unremarkable. No joint effusion. IMPRESSION: Similar appearance of tricompartmental osteoarthritic changes, no acute findings. Electronically Signed   By: Zetta Bills M.D.   On: 09/06/2021 15:41   ECHOCARDIOGRAM COMPLETE  Result Date: 09/07/2021    ECHOCARDIOGRAM REPORT   Patient Name:   Brian Williams Date of Exam: 09/07/2021 Medical Rec #:  [id number]      Height:       72.0 in Accession #:    [id number]     Weight:       270.0 lb Date of Birth:  [birth year]      BSA:          2.420 m Patient Age:    33 years       BP:           101/69 mmHg Patient Gender: M              HR:           80 bpm. Exam Location:  [facility] Procedure: 2D Echo, Color Doppler and Cardiac Doppler Indications:     Elevated troponin  History:         Patient has prior history of Echocardiogram examinations, most                  recent 05/24/2016. Cardiomyopathy, Previous Myocardial                  Infarction, AICD; Risk Factors:Hypertension.  Sonographer:     Sherrie Sport Referring Phys:  Leith Diagnosing Phys: Ida Rogue MD  Sonographer Comments: Technically  challenging study due to limited acoustic windows, no apical window and no subcostal window. Pt leaning on right side ---could not lie flat of his back nor on left side for improved imaging windows due to pain. IMPRESSIONS  1. Left ventricular ejection fraction, by estimation, is 25 to 30%. The left ventricle has severely decreased function. The left ventricle demonstrates global hypokinesis. Unable to exclude regional wall motion abnormality. The left ventricular internal  cavity size was moderately dilated. Left ventricular diastolic parameters are  indeterminate.  2. Right ventricular systolic function was not well visualized. Function appears to be depressed. The right ventricular size is normal.  3. The mitral valve was not well visualized. Mild mitral valve regurgitation.  4. The aortic valve is normal in structure. Aortic valve regurgitation is not visualized. No aortic stenosis is present.  5. Challenging images FINDINGS  Left Ventricle: Left ventricular ejection fraction, by estimation, is 25 to 30%. The left ventricle has severely decreased function. The left ventricle demonstrates global hypokinesis. The left ventricular internal cavity size was moderately dilated. There is no left ventricular hypertrophy. Left ventricular diastolic parameters are indeterminate. Right Ventricle: The right ventricular size is normal. No increase in right ventricular wall thickness. Right ventricular systolic function was not well visualized. Left Atrium: Left atrial size was normal in size. Right Atrium: Right atrial size was normal in size. Pericardium: There is no evidence of pericardial effusion. Mitral Valve: The mitral valve was not well visualized. Mild mitral valve regurgitation. No evidence of mitral valve stenosis. Tricuspid Valve: The tricuspid valve is not well visualized. Tricuspid valve regurgitation is mild . No evidence of tricuspid stenosis. Aortic Valve: The aortic valve is normal in structure. Aortic valve regurgitation is not visualized. No aortic stenosis is present. Pulmonic Valve: The pulmonic valve was not well visualized. Pulmonic valve regurgitation is not visualized. No evidence of pulmonic stenosis. Aorta: The aortic root is normal in size and structure. Venous: The pulmonary veins were not well visualized. The inferior vena cava was not well visualized. IAS/Shunts: No atrial level shunt detected by color flow Doppler. Additional Comments: A device lead is visualized.  LEFT VENTRICLE PLAX 2D LVIDd:         6.00 cm LVIDs:         5.40 cm LV PW:          1.20 cm LV IVS:        0.90 cm LVOT diam:     2.00 cm LVOT Area:     3.14 cm  LEFT ATRIUM         Index LA diam:    4.50 cm 1.86 cm/m                        PULMONIC VALVE AORTA                 PV Vmax:        0.50 m/s Ao Root diam: 3.20 cm PV Vmean:       36.000 cm/s                       PV VTI:         0.090 m                       PV Peak grad:   1.0 mmHg  PV Mean grad:   1.0 mmHg                       RVOT Peak grad: 3 mmHg   SHUNTS Systemic Diam: 2.00 cm Pulmonic VTI:  0.136 m Ida Rogue MD Electronically signed by Ida Rogue MD Signature Date/Time: 09/07/2021/5:36:21 PM    Final                LOS: 2 days   Dollie Mayse  [facility]   Pager on [email or web address]. If 7PM-7AM, please contact night-coverage at [email or web address]     09/08/2021, 1:35 PM

## 2021-09-09 ENCOUNTER — Encounter: Payer: Self-pay | Admitting: Family Medicine

## 2021-09-09 DIAGNOSIS — N10 Acute tubulo-interstitial nephritis: Secondary | ICD-10-CM

## 2021-09-09 DIAGNOSIS — J189 Pneumonia, unspecified organism: Secondary | ICD-10-CM

## 2021-09-09 DIAGNOSIS — M25561 Pain in right knee: Secondary | ICD-10-CM

## 2021-09-09 LAB — BASIC METABOLIC PANEL
Anion gap: 5 (ref 5–15)
BUN: 23 mg/dL — ABNORMAL HIGH (ref 6–20)
CO2: 23 mmol/L (ref 22–32)
Calcium: 8.4 mg/dL — ABNORMAL LOW (ref 8.9–10.3)
Chloride: 102 mmol/L (ref 98–111)
Creatinine, Ser: 0.7 mg/dL (ref 0.61–1.24)
GFR, Estimated: >60 mL/min (ref >60)
Glucose, Bld: 206 mg/dL — ABNORMAL HIGH (ref 70–99)
Potassium: 3.6 mmol/L (ref 3.5–5.1)
Sodium: 130 mmol/L — ABNORMAL LOW (ref 135–145)

## 2021-09-09 LAB — CULTURE, BLOOD (SINGLE): Special Requests: ADEQUATE

## 2021-09-09 LAB — CBC WITH DIFFERENTIAL/PLATELET
Abs Immature Granulocytes: 0.02 10*3/uL (ref 0.00–0.07)
Basophils Absolute: 0 10*3/uL (ref 0.0–0.1)
Basophils Relative: 0 %
Eosinophils Absolute: 0 10*3/uL (ref 0.0–0.5)
Eosinophils Relative: 0 %
HCT: 23.8 % — ABNORMAL LOW (ref 39.0–52.0)
Hemoglobin: 8.5 g/dL — ABNORMAL LOW (ref 13.0–17.0)
Immature Granulocytes: 1 %
Lymphocytes Relative: 37 %
Lymphs Abs: 1.2 10*3/uL (ref 0.7–4.0)
MCH: 31.6 pg (ref 26.0–34.0)
MCHC: 35.7 g/dL (ref 30.0–36.0)
MCV: 88.5 fL (ref 80.0–100.0)
Monocytes Absolute: 0.3 10*3/uL (ref 0.1–1.0)
Monocytes Relative: 10 %
Neutro Abs: 1.6 10*3/uL — ABNORMAL LOW (ref 1.7–7.7)
Neutrophils Relative %: 52 %
Platelets: 113 10*3/uL — ABNORMAL LOW (ref 150–400)
RBC: 2.69 MIL/uL — ABNORMAL LOW (ref 4.22–5.81)
RDW: 14.1 % (ref 11.5–15.5)
WBC: 3.1 10*3/uL — ABNORMAL LOW (ref 4.0–10.5)
nRBC: 0 % (ref 0.0–0.2)

## 2021-09-09 LAB — GLUCOSE, CAPILLARY
Glucose-Capillary: 174 mg/dL — ABNORMAL HIGH (ref 70–99)
Glucose-Capillary: 218 mg/dL — ABNORMAL HIGH (ref 70–99)

## 2021-09-09 LAB — URINE CULTURE: Culture: >=100000 — AB

## 2021-09-09 LAB — MAGNESIUM: Magnesium: 2.3 mg/dL (ref 1.7–2.4)

## 2021-09-09 MED ORDER — OXYCODONE HCL 5 MG PO TABS: 7.5000 mg | ORAL_TABLET | Freq: Four times a day (QID) | ORAL | Status: DC | PRN

## 2021-09-09 MED ORDER — CIPROFLOXACIN HCL 500 MG PO TABS: 500.0000 mg | ORAL_TABLET | Freq: Two times a day (BID) | ORAL | 0 refills | Status: AC

## 2021-09-09 MED ORDER — METHYLPREDNISOLONE 4 MG PO TBPK: 8.0000 mg | ORAL_TABLET | Freq: Every evening | ORAL | Status: DC

## 2021-09-09 MED ORDER — METHYLPREDNISOLONE 4 MG PO TBPK
8.0000 mg | ORAL_TABLET | Freq: Every morning | ORAL | Status: AC
  Administered 2021-09-09: 13:00:00 8 mg via ORAL
  Filled 2021-09-09: qty 21

## 2021-09-09 MED ORDER — METHYLPREDNISOLONE 4 MG PO TBPK: 4.0000 mg | ORAL_TABLET | ORAL | Status: DC

## 2021-09-09 MED ORDER — LIDOCAINE 5 % EX PTCH: 1.0000 | MEDICATED_PATCH | CUTANEOUS | 0 refills | Status: DC

## 2021-09-09 MED ORDER — LIDOCAINE 5 % EX PTCH
1.0000 | MEDICATED_PATCH | CUTANEOUS | Status: DC
  Administered 2021-09-09: 13:00:00 1 via TRANSDERMAL
  Filled 2021-09-09: qty 1

## 2021-09-09 MED ORDER — OXYCODONE HCL 5 MG PO TABS: 5.0000 mg | ORAL_TABLET | Freq: Four times a day (QID) | ORAL | 0 refills | Status: DC | PRN

## 2021-09-09 MED ORDER — CIPROFLOXACIN HCL 500 MG PO TABS
500.0000 mg | ORAL_TABLET | Freq: Two times a day (BID) | ORAL | Status: DC
  Administered 2021-09-09: 13:00:00 500 mg via ORAL
  Filled 2021-09-09: qty 1

## 2021-09-09 MED ORDER — INDOMETHACIN 25 MG PO CAPS: 25.0000 mg | ORAL_CAPSULE | Freq: Three times a day (TID) | ORAL | Status: DC

## 2021-09-09 MED ORDER — METOPROLOL SUCCINATE ER 25 MG PO TB24: 25.0000 mg | ORAL_TABLET | Freq: Every day | ORAL | Status: DC

## 2021-09-09 MED ORDER — METHYLPREDNISOLONE 4 MG PO TBPK: 4.0000 mg | ORAL_TABLET | Freq: Four times a day (QID) | ORAL | Status: DC

## 2021-09-09 MED ORDER — METHYLPREDNISOLONE 4 MG PO TBPK: 4.0000 mg | ORAL_TABLET | Freq: Three times a day (TID) | ORAL | Status: DC

## 2021-09-09 MED ORDER — METHYLPREDNISOLONE 4 MG PO TBPK
4.0000 mg | ORAL_TABLET | ORAL | Status: AC
  Administered 2021-09-09: 13:00:00 4 mg via ORAL

## 2021-09-09 NOTE — Discharge Summary (Signed)
[facility] NAME: Brian Williams    MR#:  [id number]  DATE OF BIRTH:  Aug 13, 1961  DATE OF ADMISSION:  09/06/2021 ADMITTING PHYSICIAN: Leslee Home, DO  DATE OF DISCHARGE: 09/09/2021  PRIMARY CARE PHYSICIAN: Pleas Koch, NP    ADMISSION DIAGNOSIS:  Sepsis ([place]) [A41.9] Right knee pain, unspecified chronicity [M25.561] Sepsis, due to unspecified organism, unspecified whether acute organ dysfunction present ([place]) [A41.9]  DISCHARGE DIAGNOSIS:  Klebsiella Sepsis -- UTI/Pneumonia Right knee pain --Gout  SECONDARY DIAGNOSIS:   Past Medical History:  Diagnosis Date   AICD (automatic cardioverter/defibrillator) present    Allergy    Anxiety    takes Xanax as needed   Appendicitis    Arthritis    back,knees,right shoulder   Back pain    Cardiomyopathy ([place])    CHF (congestive heart failure) ([place])    takes Lasix and Aldactone daily   Coronary artery disease    takes Plavix daily   Depression    takes Zoloft daily   Diabetes mellitus without complication (HCC)    Humulin R and Farxiga daily.Fasting blood sugar runs 140   GERD (gastroesophageal reflux disease)    Headache    History of bronchitis    History of colon polyps    benign   History of hiatal hernia    Hyperlipidemia    takes Fenofibrate,Crestor, and Zetia daily   Hypertension    takes Entresto and Coreg daily   MI (myocardial infarction) ([place]) 2001   Obesity    Peripheral neuropathy    Pneumonia    history of-last time about 14 yrs ago   PONV (postoperative nausea and vomiting)    after knee surgery 25 yrs ago b/p stayed elevated for a while   Presence of permanent cardiac pacemaker    Shortness of breath dyspnea    with exertion   Sleep apnea    uses CPAP nightly   Ventricular tachycardia    s/p RFCA PVCs 2013    HOSPITAL COURSE:  Brian Williams is a 60 y.o. male with medical history significant for type II insulin-dependent diabetes  mellitus, chronic systolic CHF/ischemic cardiomyopathy s/p AICD, CAD with coronary stent, right knee osteoarthritis with steroid injections in the past, recent gout diagnosis (right knee pain), diabetic neuropathy, OSA on CPAP, hypertension, hyperlipidemia, anxiety, GERD, obesity.  He presented to the hospital because of nausea, shortness of breath, cough, vomiting and diarrhea for about 1 week duration.  Klebsiella sepsis secondary to acute pyelonephritis and pneumonia -- clinically patient improving. Tolerating PO diet. -- Afebrile. -- Low white count suspect due to sepsis -- change antibiotics to PO Cipro-- discussed with pharmacy -- appreciate ID input  type II diabetes with hyperglycemia -- continue sliding scale and resume home meds as before  right knee pain/right knee osteoarthritis with recent diagnosis of gout -- uric acid was 12.5 -- patient follows with Dr Roland Rack-- he was started on PO colchicine and Medrol Dosepak. Patient is recommended to resume Medrol Dosepak once he discharges home. -- He has follow-up appointment next Friday for possible local injection. -- Patient is able to ambulate in the room with walker. He has a walker at home and has fully equipped house including shower.  Chronic systolic congestive heart failure, ischemic cardiomyopathy -- EF 20 to 25% January 22 -- patient tells me his blood pressure systolic stays anywhere from 90s to 120s --Continue Entresto, Aldactone and Toprol-XL.  He has an ICD in place. --  Euvolemic  CAD with coronary stents: Continue aspirin, Plavix and rosuvastatin  discussed with patient his lab results and need for him to follow-up PCP for repeat CBC next week. He overall feels okay other than right knee which has been bothering for more than two weeks. He tells me he does not want to sit over the weekend the hospital if he can take oral medications at home. Discussed discharge plan with him. Explain him if sign symptoms worsen come  back to the emergency room. He did voice understanding.  CONSULTS OBTAINED:  Treatment Team:  Tsosie Billing, MD  DRUG ALLERGIES:   Allergies  Allergen Reactions   Naproxen Other (See Comments), Anxiety and Rash    Causes hyperactivity causes hyperactivity   Morphine And Related Nausea And Vomiting    Morphine only    DISCHARGE MEDICATIONS:   Allergies as of 09/09/2021       Reactions   Naproxen Other (See Comments), Anxiety, Rash   Causes hyperactivity causes hyperactivity   Morphine And Related Nausea And Vomiting   Morphine only        Medication List     STOP taking these medications    carvedilol 6.25 MG tablet Commonly known as: COREG       TAKE these medications    aspirin EC 81 MG tablet Take 81 mg by mouth daily.   ciprofloxacin 500 MG tablet Commonly known as: CIPRO Take 1 tablet (500 mg total) by mouth 2 (two) times daily for 8 days.   clopidogrel 75 MG tablet Commonly known as: PLAVIX Take 1 tablet (75 mg total) by mouth daily.   colchicine 0.6 MG tablet Take 0.6 mg by mouth daily.   Entresto 49-51 MG Generic drug: sacubitril-valsartan Take 1 tablet by mouth 2 (two) times daily.   escitalopram 20 MG tablet Commonly known as: LEXAPRO TAKE 1 TABLET BY MOUTH  DAILY FOR ANXIETY AND  DEPRESSION   ezetimibe 10 MG tablet Commonly known as: ZETIA Take 1 tablet (10 mg total) by mouth daily. For cholesterol.   gabapentin 600 MG tablet Commonly known as: NEURONTIN TAKE TWO TABLETS BY MOUTH TWICE A DAY FOR PAIN   gemfibrozil 600 MG tablet Commonly known as: LOPID TAKE 1 TABLET( 600MG  TOTAL) BY MOUTH DAILY FOR CHOLESTEROL   HUMULIN R 500 UNIT/ML injection Generic drug: insulin regular human CONCENTRATED Inject 0-25 Units into the skin daily at 12 noon.   lidocaine 5 % Commonly known as: LIDODERM Place 1 patch onto the skin daily. Remove & Discard patch within 12 hours or as directed by MD Start taking on: September 10, 2021    methocarbamol 750 MG tablet Commonly known as: ROBAXIN Take 1 tablet (750 mg total) by mouth every 8 (eight) hours as needed for muscle spasms.   metoprolol succinate 50 MG 24 hr tablet Commonly known as: TOPROL-XL Take 50 mg by mouth daily.   nitroGLYCERIN 0.4 MG SL tablet Commonly known as: NITROSTAT Place 1 tablet (0.4 mg total) under the tongue every 5 (five) minutes as needed for chest pain.   omega-3 acid ethyl esters 1 g capsule Commonly known as: LOVAZA Take 1 g by mouth 2 (two) times daily.   oxyCODONE 5 MG immediate release tablet Commonly known as: Oxy IR/ROXICODONE Take 1 tablet (5 mg total) by mouth every 6 (six) hours as needed for severe pain.   Ozempic (2 MG/DOSE) 8 MG/3ML Sopn Generic drug: Semaglutide (2 MG/DOSE) Inject 2 mg into the skin every Friday.   rosuvastatin 20  MG tablet Commonly known as: CRESTOR TAKE 1 TABLET BY MOUTH AT  BEDTIME FOR CHOLESTEROL   spironolactone 50 MG tablet Commonly known as: ALDACTONE Take 25 mg by mouth daily.   traZODone 100 MG tablet Commonly known as: DESYREL TAKE 2 TABLETS BY MOUTH  EVERY NIGHT AT BEDTIME for sleep.        If you experience worsening of your admission symptoms, develop shortness of breath, life threatening emergency, suicidal or homicidal thoughts you must seek medical attention immediately by calling 911 or calling your MD immediately  if symptoms less severe.  You Must read complete instructions/literature along with all the possible adverse reactions/side effects for all the Medicines you take and that have been prescribed to you. Take any new Medicines after you have completely understood and accept all the possible adverse reactions/side effects.   Please note  You were cared for by a hospitalist during your hospital stay. If you have any questions about your discharge medications or the care you received while you were in the hospital after you are discharged, you can call the unit and asked to  speak with the hospitalist on call if the hospitalist that took care of you is not available. Once you are discharged, your primary care physician will handle any further medical issues. Please note that NO REFILLS for any discharge medications will be authorized once you are discharged, as it is imperative that you return to your primary care physician (or establish a relationship with a primary care physician if you do not have one) for your aftercare needs so that they can reassess your need for medications and monitor your lab values. Today   SUBJECTIVE  Right knee pain No fever Tolerating po diet   VITAL SIGNS:  Blood pressure 91/80, pulse 79, temperature 98.1 F (36.7 C), resp. rate 15, height 6' (1.829 m), weight 122.5 kg, SpO2 100 %.  I/O:   Intake/Output Summary (Last 24 hours) at 09/09/2021 1310 Last data filed at 09/09/2021 1036 Gross per 24 hour  Intake 240 ml  Output 850 ml  Net -610 ml    PHYSICAL EXAMINATION:  GENERAL:  60 y.o.-year-old patient lying in the bed with no acute distress.  LUNGS: Normal breath sounds bilaterally, no wheezing, rales,rhonchi or crepitation. No use of accessory muscles of respiration.  CARDIOVASCULAR: S1, S2 normal. No murmurs, rubs, or gallops.  ABDOMEN: Soft, non-tender, non-distended. Bowel sounds present. No organomegaly or mass.  EXTREMITIES: No pedal edema, cyanosis, or clubbing.  NEUROLOGIC: non-focal PSYCHIATRIC:  patient is alert and oriented x 3.  SKIN: No obvious rash, lesion, or ulcer.   DATA REVIEW:   CBC  Recent Labs  Lab 09/09/21 0302  WBC 3.1*  HGB 8.5*  HCT 23.8*  PLT 113*    Chemistries  Recent Labs  Lab 09/06/21 0937 09/07/21 0529 09/09/21 0302  NA 124*   < > 130*  K 3.8   < > 3.6  CL 94*   < > 102  CO2 18*   < > 23  GLUCOSE 365*   < > 206*  BUN 27*   < > 23*  CREATININE 0.83   < > 0.70  CALCIUM 8.9   < > 8.4*  MG  --    < > 2.3  AST 25  --   --   ALT 21  --   --   ALKPHOS 45  --   --   BILITOT  0.8  --   --    < > =  values in this interval not displayed.    Microbiology Results   Recent Results (from the past 240 hour(s))  Blood culture (single)     Status: Abnormal   Collection Time: 09/06/21  9:38 AM   Specimen: BLOOD  Result Value Ref Range Status   Specimen Description   Final    BLOOD LEFT HAND Performed at [facility], [address], [place], [place] [address]    Special Requests   Final    BOTTLES DRAWN AEROBIC AND ANAEROBIC Blood Culture results may not be optimal due to an excessive volume of blood received in culture bottles Performed at [facility], [address] [place], [place] [address]    Culture  Setup Time   Final    GRAM NEGATIVE RODS IN BOTH AEROBIC AND ANAEROBIC BOTTLES CRITICAL RESULT CALLED TO, READ BACK BY AND VERIFIED WITH: CARISSA DOLAN @ 2146 ON 09/06/21.Marland KitchenMarland KitchenTKR Performed at [facility], [place] [address], [place], [place] [address]    Culture KLEBSIELLA PNEUMONIAE (A)  Final   Report Status 09/09/2021 FINAL  Final   Organism ID, Bacteria KLEBSIELLA PNEUMONIAE  Final      Susceptibility   Klebsiella pneumoniae - MIC*    AMPICILLIN RESISTANT Resistant     CEFAZOLIN <=4 SENSITIVE Sensitive     CEFEPIME <=0.12 SENSITIVE Sensitive     CEFTAZIDIME <=1 SENSITIVE Sensitive     CEFTRIAXONE <=0.25 SENSITIVE Sensitive     CIPROFLOXACIN <=0.25 SENSITIVE Sensitive     GENTAMICIN <=1 SENSITIVE Sensitive     IMIPENEM 0.5 SENSITIVE Sensitive     TRIMETH/SULFA <=20 SENSITIVE Sensitive     AMPICILLIN/SULBACTAM <=2 SENSITIVE Sensitive     PIP/TAZO <=4 SENSITIVE Sensitive     * KLEBSIELLA PNEUMONIAE  Blood Culture ID Panel (Reflexed)     Status: Abnormal   Collection Time: 09/06/21  9:38 AM  Result Value Ref Range Status   Enterococcus faecalis NOT DETECTED NOT DETECTED Final   Enterococcus Faecium NOT DETECTED NOT DETECTED Final   Listeria monocytogenes NOT DETECTED NOT DETECTED Final   Staphylococcus species NOT DETECTED  NOT DETECTED Final   Staphylococcus aureus (BCID) NOT DETECTED NOT DETECTED Final   Staphylococcus epidermidis NOT DETECTED NOT DETECTED Final   Staphylococcus lugdunensis NOT DETECTED NOT DETECTED Final   Streptococcus species NOT DETECTED NOT DETECTED Final   Streptococcus agalactiae NOT DETECTED NOT DETECTED Final   Streptococcus pneumoniae NOT DETECTED NOT DETECTED Final   Streptococcus pyogenes NOT DETECTED NOT DETECTED Final   A.calcoaceticus-baumannii NOT DETECTED NOT DETECTED Final   Bacteroides fragilis NOT DETECTED NOT DETECTED Final   Enterobacterales DETECTED (A) NOT DETECTED Final    Comment: Enterobacterales represent a large order of gram negative bacteria, not a single organism. CRITICAL RESULT CALLED TO, READ BACK BY AND VERIFIED WITH: CARISSA DOLAN @ 2146 ON 09/06/21.Marland KitchenMarland KitchenTKR    Enterobacter cloacae complex NOT DETECTED NOT DETECTED Final   Escherichia coli NOT DETECTED NOT DETECTED Final   Klebsiella aerogenes NOT DETECTED NOT DETECTED Final   Klebsiella oxytoca NOT DETECTED NOT DETECTED Final   Klebsiella pneumoniae DETECTED (A) NOT DETECTED Final    Comment: CRITICAL RESULT CALLED TO, READ BACK BY AND VERIFIED WITH: CARISSA DOLAN @ 2146 ON 09/06/21.Marland KitchenMarland KitchenTKR    Proteus species NOT DETECTED NOT DETECTED Final   Salmonella species NOT DETECTED NOT DETECTED Final   Serratia marcescens NOT DETECTED NOT DETECTED Final   Haemophilus influenzae NOT DETECTED NOT DETECTED Final   Neisseria meningitidis NOT DETECTED NOT DETECTED Final  Pseudomonas aeruginosa NOT DETECTED NOT DETECTED Final   Stenotrophomonas maltophilia NOT DETECTED NOT DETECTED Final   Candida albicans NOT DETECTED NOT DETECTED Final   Candida auris NOT DETECTED NOT DETECTED Final   Candida glabrata NOT DETECTED NOT DETECTED Final   Candida krusei NOT DETECTED NOT DETECTED Final   Candida parapsilosis NOT DETECTED NOT DETECTED Final   Candida tropicalis NOT DETECTED NOT DETECTED Final   Cryptococcus  neoformans/gattii NOT DETECTED NOT DETECTED Final   CTX-M ESBL NOT DETECTED NOT DETECTED Final   Carbapenem resistance IMP NOT DETECTED NOT DETECTED Final   Carbapenem resistance KPC NOT DETECTED NOT DETECTED Final   Carbapenem resistance NDM NOT DETECTED NOT DETECTED Final   Carbapenem resist OXA 48 LIKE NOT DETECTED NOT DETECTED Final   Carbapenem resistance VIM NOT DETECTED NOT DETECTED Final    Comment: Performed at [facility], [address] [place], [place] [address]  Resp Panel by RT-PCR (Flu A&B, Covid) Nasopharyngeal Swab     Status: None   Collection Time: 09/06/21  1:25 PM   Specimen: Nasopharyngeal Swab; Nasopharyngeal(NP) swabs in vial transport medium  Result Value Ref Range Status   SARS Coronavirus 2 by RT PCR NEGATIVE NEGATIVE Final    Comment: (NOTE) SARS-CoV-2 target nucleic acids are NOT DETECTED.  The SARS-CoV-2 RNA is generally detectable in upper respiratory specimens during the acute phase of infection. The lowest concentration of SARS-CoV-2 viral copies this assay can detect is 138 copies/mL. A negative result does not preclude SARS-Cov-2 infection and should not be used as the sole basis for treatment or other patient management decisions. A negative result may occur with  improper specimen collection/handling, submission of specimen other than nasopharyngeal swab, presence of viral mutation(s) within the areas targeted by this assay, and inadequate number of viral copies(<138 copies/mL). A negative result must be combined with clinical observations, patient history, and epidemiological information. The expected result is Negative.  Fact Sheet for Patients:  EntrepreneurPulse.com.au  Fact Sheet for Healthcare Providers:  IncredibleEmployment.be  This test is no t yet approved or cleared by the [place] FDA and  has been authorized for detection and/or diagnosis of SARS-CoV-2 by FDA under an Emergency  Use Authorization (EUA). This EUA will remain  in effect (meaning this test can be used) for the duration of the COVID-19 declaration under Section 564(b)(1) of the Act, 21 U.S.C.section 360bbb-3(b)(1), unless the authorization is terminated  or revoked sooner.       Influenza A by PCR NEGATIVE NEGATIVE Final   Influenza B by PCR NEGATIVE NEGATIVE Final    Comment: (NOTE) The Xpert Xpress SARS-CoV-2/FLU/RSV plus assay is intended as an aid in the diagnosis of influenza from Nasopharyngeal swab specimens and should not be used as a sole basis for treatment. Nasal washings and aspirates are unacceptable for Xpert Xpress SARS-CoV-2/FLU/RSV testing.  Fact Sheet for Patients: EntrepreneurPulse.com.au  Fact Sheet for Healthcare Providers: IncredibleEmployment.be  This test is not yet approved or cleared by the [place] FDA and has been authorized for detection and/or diagnosis of SARS-CoV-2 by FDA under an Emergency Use Authorization (EUA). This EUA will remain in effect (meaning this test can be used) for the duration of the COVID-19 declaration under Section 564(b)(1) of the Act, 21 U.S.C. section 360bbb-3(b)(1), unless the authorization is terminated or revoked.  Performed at [facility], [address], [place], [place] [address]   Blood culture (single)     Status: Abnormal   Collection Time: 09/06/21  2:04 PM  Specimen: BLOOD  Result Value Ref Range Status   Specimen Description   Final    BLOOD BLOOD RIGHT HAND Performed at [facility], [address], [place], [place] [address]    Special Requests   Final    BOTTLES DRAWN AEROBIC AND ANAEROBIC Blood Culture adequate volume Performed at [facility], [address] [place], [place] [address]    Culture  Setup Time   Final    GRAM NEGATIVE RODS IN BOTH AEROBIC AND ANAEROBIC BOTTLES CRITICAL VALUE NOTED.  VALUE IS CONSISTENT WITH PREVIOUSLY  REPORTED AND CALLED VALUE. Performed at [facility], [address] [place], [place] [address]    Culture (A)  Final    KLEBSIELLA PNEUMONIAE SUSCEPTIBILITIES PERFORMED ON PREVIOUS CULTURE WITHIN THE LAST 5 DAYS. Performed at [facility], [place] [address], [place], [place] [address]    Report Status 09/09/2021 FINAL  Final  Urine Culture     Status: Abnormal   Collection Time: 09/06/21  3:04 PM   Specimen: Urine, Random  Result Value Ref Range Status   Specimen Description   Final    URINE, RANDOM Performed at [facility], [address] [place], [place] [address]    Special Requests   Final    NONE Performed at [facility], [address] [place],  [address]    Culture >=100,000 COLONIES/mL KLEBSIELLA PNEUMONIAE (A)  Final   Report Status 09/09/2021 FINAL  Final   Organism ID, Bacteria KLEBSIELLA PNEUMONIAE (A)  Final      Susceptibility   Klebsiella pneumoniae - MIC*    AMPICILLIN RESISTANT Resistant     CEFAZOLIN <=4 SENSITIVE Sensitive     CEFEPIME <=0.12 SENSITIVE Sensitive     CEFTRIAXONE <=0.25 SENSITIVE Sensitive     CIPROFLOXACIN <=0.25 SENSITIVE Sensitive     GENTAMICIN <=1 SENSITIVE Sensitive     IMIPENEM 1 SENSITIVE Sensitive     NITROFURANTOIN 64 INTERMEDIATE Intermediate     TRIMETH/SULFA <=20 SENSITIVE Sensitive     AMPICILLIN/SULBACTAM <=2 SENSITIVE Sensitive     PIP/TAZO <=4 SENSITIVE Sensitive     * >=100,000 COLONIES/mL KLEBSIELLA PNEUMONIAE  Respiratory (~20 pathogens) panel by PCR     Status: None   Collection Time: 09/08/21  1:56 AM   Specimen: Nasopharyngeal Swab; Respiratory  Result Value Ref Range Status   Adenovirus NOT DETECTED NOT DETECTED Final   Coronavirus 229E NOT DETECTED NOT DETECTED Final    Comment: (NOTE) The Coronavirus on the Respiratory Panel, DOES NOT test for the novel  Coronavirus (2019 nCoV)    Coronavirus HKU1 NOT DETECTED NOT DETECTED Final   Coronavirus NL63 NOT DETECTED NOT  DETECTED Final   Coronavirus OC43 NOT DETECTED NOT DETECTED Final   Metapneumovirus NOT DETECTED NOT DETECTED Final   Rhinovirus / Enterovirus NOT DETECTED NOT DETECTED Final   Influenza A NOT DETECTED NOT DETECTED Final   Influenza B NOT DETECTED NOT DETECTED Final   Parainfluenza Virus 1 NOT DETECTED NOT DETECTED Final   Parainfluenza Virus 2 NOT DETECTED NOT DETECTED Final   Parainfluenza Virus 3 NOT DETECTED NOT DETECTED Final   Parainfluenza Virus 4 NOT DETECTED NOT DETECTED Final   Respiratory Syncytial Virus NOT DETECTED NOT DETECTED Final   Bordetella pertussis NOT DETECTED NOT DETECTED Final   Bordetella Parapertussis NOT DETECTED NOT DETECTED Final   Chlamydophila pneumoniae NOT DETECTED NOT DETECTED Final   Mycoplasma pneumoniae NOT DETECTED NOT DETECTED Final    Comment: Performed at [facility], [place] [address], [place], [place] [address]  CULTURE, BLOOD (ROUTINE X 2) w Reflex to ID Panel     Status: None (Preliminary result)   Collection Time: 09/08/21  4:36 AM   Specimen: BLOOD LEFT HAND  Result Value Ref Range Status   Specimen Description BLOOD LEFT HAND  Final   Special Requests   Final    BOTTLES DRAWN AEROBIC AND ANAEROBIC Blood Culture adequate volume   Culture   Final    NO GROWTH 1 DAY Performed at [facility], [address] [place], [place] [address]    Report Status PENDING  Incomplete  CULTURE, BLOOD (ROUTINE X 2) w Reflex to ID Panel     Status: None (Preliminary result)   Collection Time: 09/08/21  4:37 AM   Specimen: BLOOD RIGHT HAND  Result Value Ref Range Status   Specimen Description BLOOD RIGHT HAND  Final   Special Requests   Final    IN PEDIATRIC BOTTLE Blood Culture results may not be optimal due to an inadequate volume of blood received in culture bottles   Culture   Final    NO GROWTH 1 DAY Performed at [facility], [address] [place], [place] [address]    Report Status PENDING  Incomplete  C Difficile  Quick Screen w PCR reflex     Status: None   Collection Time: 09/08/21  3:55 PM   Specimen: STOOL  Result Value Ref Range Status   C Diff antigen NEGATIVE NEGATIVE Final   C Diff toxin NEGATIVE NEGATIVE Final   C Diff interpretation No C. difficile detected.  Final    Comment: Performed at [facility], [address] [place], [place] [address]  Gastrointestinal Panel by PCR , Stool     Status: None   Collection Time: 09/08/21  4:29 PM   Specimen: Stool  Result Value Ref Range Status   Campylobacter species NOT DETECTED NOT DETECTED Final   Plesimonas shigelloides NOT DETECTED NOT DETECTED Final   Salmonella species NOT DETECTED NOT DETECTED Final   Yersinia enterocolitica NOT DETECTED NOT DETECTED Final   Vibrio species NOT DETECTED NOT DETECTED Final   Vibrio cholerae NOT DETECTED NOT DETECTED Final   Enteroaggregative E coli (EAEC) NOT DETECTED NOT DETECTED Final   Enteropathogenic E coli (EPEC) NOT DETECTED NOT DETECTED Final   Enterotoxigenic E coli (ETEC) NOT DETECTED NOT DETECTED Final   Shiga like toxin producing E coli (STEC) NOT DETECTED NOT DETECTED Final   Shigella/Enteroinvasive E coli (EIEC) NOT DETECTED NOT DETECTED Final   Cryptosporidium NOT DETECTED NOT DETECTED Final   Cyclospora cayetanensis NOT DETECTED NOT DETECTED Final   Entamoeba histolytica NOT DETECTED NOT DETECTED Final   Giardia lamblia NOT DETECTED NOT DETECTED Final   Adenovirus F40/41 NOT DETECTED NOT DETECTED Final   Astrovirus NOT DETECTED NOT DETECTED Final   Norovirus GI/GII NOT DETECTED NOT DETECTED Final   Rotavirus A NOT DETECTED NOT DETECTED Final   Sapovirus (I, II, IV, and V) NOT DETECTED NOT DETECTED Final    Comment: Performed at [facility], [address] , [place] [address]    RADIOLOGY:  No results found.   CODE STATUS:     Code Status Orders  (From admission, onward)           Start     Ordered   09/06/21 1933  Full code  Continuous         09/06/21 1934  Code Status History     Date Active Date Inactive Code Status Order ID Comments User Context   04/03/2021 1626 04/06/2021 1922 Full Code [id number]  Carlus Pavlov ED   12/06/2020 2059 12/08/2020 2131 Full Code [id number]  Toy Baker, MD ED   05/24/2016 1419 05/30/2016 1751 Full Code [id number]  Nani Skillern, PA-C Inpatient   06/16/2015 1800 06/16/2015 2311 Full Code [id number]  Deboraha Sprang, MD Inpatient      Advance Directive Documentation    Flowsheet Row Most Recent Value  Type of Advance Directive Living will, Healthcare Power of Attorney  Pre-existing out of facility DNR order (yellow form or pink MOST form) --  "MOST" Form in Place? --        TOTAL TIME TAKING CARE OF THIS PATIENT: 35 minutes.    Fritzi Mandes M.D  [facility]    CC: Primary care physician; Pleas Koch, NP

## 2021-09-09 NOTE — Plan of Care (Signed)
Patient discharged to home with family.  Discharge instructions reviewed with patient including appointments, instructions, and medication changes who verbalized understanding.  PIV removed with tip intact.

## 2021-09-09 NOTE — Discharge Instructions (Signed)
Patient advised to follow-up with primary care physician next week and get labs checked. Patient advised to keep appointment with orthopedic Dr Roland Rack for bilateral knee pain.

## 2021-09-12 ENCOUNTER — Telehealth: Payer: Self-pay

## 2021-09-12 NOTE — Telephone Encounter (Signed)
TCM call was done.Patient has [employer]. Patient plans on making a hosp. F/U with NP K. Clark.

## 2021-09-13 LAB — CULTURE, BLOOD (ROUTINE X 2)
Culture: NO GROWTH
Culture: NO GROWTH
Special Requests: ADEQUATE

## 2021-09-15 ENCOUNTER — Other Ambulatory Visit
Admission: RE | Admit: 2021-09-15 | Discharge: 2021-09-15 | Disposition: A | Discharge status: Home / Self Care | Payer: Medicare Other | Source: Ambulatory Visit | Attending: Student | Admitting: Student

## 2021-09-15 DIAGNOSIS — M109 Gout, unspecified: Secondary | ICD-10-CM | POA: Diagnosis not present

## 2021-09-15 DIAGNOSIS — M1711 Unilateral primary osteoarthritis, right knee: Secondary | ICD-10-CM | POA: Diagnosis not present

## 2021-09-15 DIAGNOSIS — G4733 Obstructive sleep apnea (adult) (pediatric): Secondary | ICD-10-CM | POA: Diagnosis not present

## 2021-09-15 DIAGNOSIS — Z8619 Personal history of other infectious and parasitic diseases: Secondary | ICD-10-CM | POA: Diagnosis not present

## 2021-09-15 DIAGNOSIS — R0681 Apnea, not elsewhere classified: Secondary | ICD-10-CM | POA: Diagnosis not present

## 2021-09-15 DIAGNOSIS — M25461 Effusion, right knee: Secondary | ICD-10-CM | POA: Insufficient documentation

## 2021-09-15 DIAGNOSIS — I10 Essential (primary) hypertension: Secondary | ICD-10-CM | POA: Diagnosis not present

## 2021-09-15 LAB — SYNOVIAL CELL COUNT + DIFF, W/ CRYSTALS
Eosinophils-Synovial: 0 %
Lymphocytes-Synovial Fld: 54 %
Monocyte-Macrophage-Synovial Fluid: 32 %
Neutrophil, Synovial: 14 %
Other Cells-SYN: 0
WBC, Synovial: 448 /mm3 — ABNORMAL HIGH (ref 0–200)

## 2021-09-20 ENCOUNTER — Encounter: Payer: Medicare Other | Admitting: Primary Care

## 2021-09-21 DIAGNOSIS — M170 Bilateral primary osteoarthritis of knee: Secondary | ICD-10-CM | POA: Diagnosis not present

## 2021-09-22 ENCOUNTER — Telehealth: Payer: Self-pay | Admitting: Primary Care

## 2021-09-22 DIAGNOSIS — G4700 Insomnia, unspecified: Secondary | ICD-10-CM

## 2021-09-22 MED ORDER — TRAZODONE HCL 100 MG PO TABS: 200.0000 mg | ORAL_TABLET | Freq: Every day | ORAL | 0 refills | Status: DC

## 2021-09-22 NOTE — Addendum Note (Signed)
Addended by: Pleas Koch on: 09/22/2021 01:18 PM   Modules accepted: Orders

## 2021-09-22 NOTE — Telephone Encounter (Signed)
Mr. Elsass called in and stated that he has been having a problem with getting his medication throught the mail and he has to fight and they told him that he has to wait a month to go through the insurance. Ad wanted to know if Anda Kraft can write a script for traZODone (DESYREL) 100 MG tablet to [facility] on [address]

## 2021-09-22 NOTE — Telephone Encounter (Signed)
Noted.  Rx for Trazodone 100 mg sent to [facility] pharmacy for one month supply.

## 2021-09-22 NOTE — Telephone Encounter (Signed)
Sorry he needs it to go to [address] just for the month until it get fixed with mail order

## 2021-09-28 NOTE — Telephone Encounter (Signed)
Patient was contact and informed him that he is to hold his Plavix 5 days before his procedure and restart 1 day after. Patient understood and had no further questions.

## 2021-09-29 ENCOUNTER — Ambulatory Visit: Payer: Medicare Other | Admitting: Family

## 2021-10-04 ENCOUNTER — Other Ambulatory Visit: Payer: Self-pay | Admitting: Primary Care

## 2021-10-04 DIAGNOSIS — E785 Hyperlipidemia, unspecified: Secondary | ICD-10-CM

## 2021-10-06 ENCOUNTER — Ambulatory Visit: Payer: Medicare Other | Admitting: Anesthesiology

## 2021-10-06 ENCOUNTER — Encounter
Admission: RE | Disposition: A | Discharge status: Home / Self Care | Payer: Self-pay | Source: Home / Self Care | Attending: Gastroenterology

## 2021-10-06 ENCOUNTER — Ambulatory Visit
Admission: RE | Admit: 2021-10-06 | Discharge: 2021-10-06 | Disposition: A | Discharge status: Home / Self Care | Payer: Medicare Other | Attending: Gastroenterology | Admitting: Gastroenterology

## 2021-10-06 DIAGNOSIS — E119 Type 2 diabetes mellitus without complications: Secondary | ICD-10-CM | POA: Diagnosis not present

## 2021-10-06 DIAGNOSIS — Z6834 Body mass index (BMI) 34.0-34.9, adult: Secondary | ICD-10-CM | POA: Diagnosis not present

## 2021-10-06 DIAGNOSIS — I252 Old myocardial infarction: Secondary | ICD-10-CM | POA: Insufficient documentation

## 2021-10-06 DIAGNOSIS — Z86010 Personal history of colonic polyps: Secondary | ICD-10-CM | POA: Diagnosis not present

## 2021-10-06 DIAGNOSIS — Z955 Presence of coronary angioplasty implant and graft: Secondary | ICD-10-CM | POA: Diagnosis not present

## 2021-10-06 DIAGNOSIS — I509 Heart failure, unspecified: Secondary | ICD-10-CM | POA: Insufficient documentation

## 2021-10-06 DIAGNOSIS — G4730 Sleep apnea, unspecified: Secondary | ICD-10-CM | POA: Diagnosis not present

## 2021-10-06 DIAGNOSIS — K635 Polyp of colon: Secondary | ICD-10-CM | POA: Diagnosis not present

## 2021-10-06 DIAGNOSIS — Z87891 Personal history of nicotine dependence: Secondary | ICD-10-CM | POA: Diagnosis not present

## 2021-10-06 DIAGNOSIS — Z951 Presence of aortocoronary bypass graft: Secondary | ICD-10-CM | POA: Diagnosis not present

## 2021-10-06 DIAGNOSIS — Z95810 Presence of automatic (implantable) cardiac defibrillator: Secondary | ICD-10-CM | POA: Diagnosis not present

## 2021-10-06 DIAGNOSIS — I110 Hypertensive heart disease with heart failure: Secondary | ICD-10-CM | POA: Insufficient documentation

## 2021-10-06 DIAGNOSIS — Z794 Long term (current) use of insulin: Secondary | ICD-10-CM | POA: Insufficient documentation

## 2021-10-06 DIAGNOSIS — E669 Obesity, unspecified: Secondary | ICD-10-CM | POA: Insufficient documentation

## 2021-10-06 DIAGNOSIS — H524 Presbyopia: Secondary | ICD-10-CM | POA: Diagnosis not present

## 2021-10-06 DIAGNOSIS — I2510 Atherosclerotic heart disease of native coronary artery without angina pectoris: Secondary | ICD-10-CM | POA: Insufficient documentation

## 2021-10-06 DIAGNOSIS — K219 Gastro-esophageal reflux disease without esophagitis: Secondary | ICD-10-CM | POA: Insufficient documentation

## 2021-10-06 DIAGNOSIS — Z1211 Encounter for screening for malignant neoplasm of colon: Secondary | ICD-10-CM | POA: Diagnosis not present

## 2021-10-06 DIAGNOSIS — D122 Benign neoplasm of ascending colon: Secondary | ICD-10-CM | POA: Diagnosis not present

## 2021-10-06 HISTORY — PX: COLONOSCOPY WITH PROPOFOL: SHX5780

## 2021-10-06 LAB — GLUCOSE, CAPILLARY: Glucose-Capillary: 97 mg/dL (ref 70–99)

## 2021-10-06 SURGERY — COLONOSCOPY WITH PROPOFOL
Anesthesia: General

## 2021-10-06 MED ORDER — PHENYLEPHRINE 40 MCG/ML (10ML) SYRINGE FOR IV PUSH (FOR BLOOD PRESSURE SUPPORT)
PREFILLED_SYRINGE | INTRAVENOUS | Status: DC | PRN
Start: 2021-10-06 — End: 2021-10-06
  Administered 2021-10-06: 160 ug via INTRAVENOUS

## 2021-10-06 MED ORDER — PROPOFOL 500 MG/50ML IV EMUL
INTRAVENOUS | Status: DC | PRN
  Administered 2021-10-06: 150 ug/kg/min via INTRAVENOUS

## 2021-10-06 MED ORDER — PROPOFOL 10 MG/ML IV BOLUS
INTRAVENOUS | Status: DC | PRN
  Administered 2021-10-06: 100 mg via INTRAVENOUS

## 2021-10-06 MED ORDER — LIDOCAINE HCL (PF) 2 % IJ SOLN
INTRAMUSCULAR | Status: AC
  Filled 2021-10-06: qty 5

## 2021-10-06 MED ORDER — SODIUM CHLORIDE 0.9 % IV SOLN
INTRAVENOUS | Status: DC
  Administered 2021-10-06: 20 mL/h via INTRAVENOUS

## 2021-10-06 MED ORDER — STERILE WATER FOR IRRIGATION IR SOLN
Status: DC | PRN
  Administered 2021-10-06: 60 mL

## 2021-10-06 MED ORDER — DEXMEDETOMIDINE HCL IN NACL 200 MCG/50ML IV SOLN
INTRAVENOUS | Status: DC | PRN
Start: 2021-10-06 — End: 2021-10-06
  Administered 2021-10-06: 4 ug via INTRAVENOUS
  Administered 2021-10-06: 8 ug via INTRAVENOUS

## 2021-10-06 NOTE — Transfer of Care (Signed)
Immediate Anesthesia Transfer of Care Note  Patient: Brian Williams  Procedure(s) Performed: COLONOSCOPY WITH PROPOFOL  Patient Location: PACU and Endoscopy Unit  Anesthesia Type:General  Level of Consciousness: drowsy  Airway & Oxygen Therapy: Patient Spontanous Breathing  Post-op Assessment: Report given to RN and Post -op Vital signs reviewed and stable  Post vital signs: Reviewed and stable  Last Vitals:  Vitals Value Taken Time  BP 81/63 10/06/21 1045  Temp 35.6 C 10/06/21 1040  Pulse 72 10/06/21 1046  Resp 18 10/06/21 1046  SpO2 99 % 10/06/21 1046  Vitals shown include unvalidated device data.  Last Pain:  Vitals:   10/06/21 1040  TempSrc: Temporal  PainSc: Asleep         Complications: No notable events documented.

## 2021-10-06 NOTE — Op Note (Signed)
[facility] Patient Name: Brian Williams Procedure Date: 10/06/2021 10:04 AM MRN: [id number] Account #: [id number] Date of Birth: 1961/06/27 Admit Type: Outpatient Age: 61 Room: [facility] ENDO ROOM 4 Gender: Male Note Status: Finalized Instrument Name: Park Meo [id number] Procedure:             Colonoscopy Indications:           Surveillance: Personal history of adenomatous polyps                         on last colonoscopy 3 years ago, Last colonoscopy:                         December 2019 Providers:             Jonathon Bellows MD, MD Referring MD:          Pleas Koch (Referring MD) Medicines:             Monitored Anesthesia Care Complications:         No immediate complications. Procedure:             Pre-Anesthesia Assessment:                        - Prior to the procedure, a History and Physical was                         performed, and patient medications, allergies and                         sensitivities were reviewed. The patient's tolerance                         of previous anesthesia was reviewed.                        - The risks and benefits of the procedure and the                         sedation options and risks were discussed with the                         patient. All questions were answered and informed                         consent was obtained.                        - ASA Grade Assessment: III - A patient with severe                         systemic disease.                        After obtaining informed consent, the colonoscope was                         passed under direct vision. Throughout the procedure,                         the patient's blood pressure,  pulse, and oxygen                         saturations were monitored continuously. The                         Colonoscope was introduced through the anus and                         advanced to the the cecum, identified by the                          appendiceal orifice. The colonoscopy was performed                         with ease. The patient tolerated the procedure well.                         The quality of the bowel preparation was good. Findings:      The perianal and digital rectal examinations were normal.      A 5 mm polyp was found in the ascending colon. The polyp was sessile.       The polyp was removed with a cold snare. Resection and retrieval were       complete.      The exam was otherwise without abnormality on direct and retroflexion       views. Impression:            - One 5 mm polyp in the ascending colon, removed with                         a cold snare. Resected and retrieved.                        - The examination was otherwise normal on direct and                         retroflexion views. Recommendation:        - Discharge patient to home (with escort).                        - Resume previous diet.                        - Continue present medications.                        - Await pathology results.                        - Repeat colonoscopy for surveillance based on                         pathology results. Procedure Code(s):     --- Professional ---                        [phone], Colonoscopy, flexible; with removal of                         tumor(s), polyp(s), or other lesion(s)  by snare                         technique Diagnosis Code(s):     --- Professional ---                        Z86.010, Personal history of colonic polyps                        K63.5, Polyp of colon CPT copyright 2019 American Medical Association. All rights reserved. The codes documented in this report are preliminary and upon coder review may  be revised to meet current compliance requirements. Jonathon Bellows, MD Jonathon Bellows MD, MD 10/06/2021 10:42:50 AM This report has been signed electronically. Number of Addenda: 0 Note Initiated On: 10/06/2021 10:04 AM Scope Withdrawal Time: 0 hours 15 minutes 45 seconds  Total  Procedure Duration: 0 hours 24 minutes 7 seconds  Estimated Blood Loss:  Estimated blood loss: none.      [facility]

## 2021-10-06 NOTE — Anesthesia Postprocedure Evaluation (Signed)
Anesthesia Post Note  Patient: Brian Williams  Procedure(s) Performed: COLONOSCOPY WITH PROPOFOL  Patient location during evaluation: Endoscopy Anesthesia Type: General Level of consciousness: awake and alert Pain management: pain level controlled Vital Signs Assessment: post-procedure vital signs reviewed and stable Respiratory status: spontaneous breathing, nonlabored ventilation, respiratory function stable and patient connected to nasal cannula oxygen Cardiovascular status: blood pressure returned to baseline and stable Postop Assessment: no apparent nausea or vomiting Anesthetic complications: no   No notable events documented.   Last Vitals:  Vitals:   10/06/21 1100 10/06/21 1110  BP: (!) 108/59 115/64  Pulse: 65 65  Resp: 15 15  Temp:    SpO2: 100% 100%    Last Pain:  Vitals:   10/06/21 1110  TempSrc:   PainSc: 0-No pain                 Precious Haws Marnell Mcdaniel

## 2021-10-06 NOTE — Anesthesia Preprocedure Evaluation (Signed)
Anesthesia Evaluation  Patient identified by MRN, date of birth, ID band Patient awake    Reviewed: Allergy & Precautions, NPO status , Patient's Chart, lab work & pertinent test results  History of Anesthesia Complications (+) PONV and history of anesthetic complications  Airway Mallampati: III  TM Distance: >3 FB Neck ROM: Full    Dental  (+) Poor Dentition, Missing, Implants   Pulmonary shortness of breath and with exertion, sleep apnea and Continuous Positive Airway Pressure Ventilation , neg COPD, Patient abstained from smoking., former smoker,    Pulmonary exam normal  (-) wheezing      Cardiovascular hypertension, (-) angina+ CAD, + Past MI, + Cardiac Stents (all prior to CABG), + CABG and +CHF (EF 25%)  Normal cardiovascular exam+ pacemaker + Cardiac Defibrillator  - Systolic murmurs Echo 10/10/56:  Dilated left ventricle - mild  Severely decreased left ventricular systolic function, ejection fraction  25 to 30%  Segmental wall motion abnormality - (anteroseptal)  Diastolic dysfunction - grade II (elevated filling pressures)  Dilated left atrium - mild  Low normal right ventricular systolic function  Mildly elevated right atrial pressure   Neuro/Psych  Headaches, neg Seizures PSYCHIATRIC DISORDERS Anxiety Depression  Neuromuscular disease    GI/Hepatic Neg liver ROS, hiatal hernia, GERD  ,  Endo/Other  diabetes, Insulin Dependent  Renal/GU Renal disease     Musculoskeletal  (+) Arthritis ,   Abdominal (+) + obese,   Peds  Hematology negative hematology ROS (+)   Anesthesia Other Findings Past Medical History: No date: AICD (automatic cardioverter/defibrillator) present No date: Anxiety     Comment:  takes Xanax as needed No date: Arthritis     Comment:  back,knees,right shoulder No date: Back pain No date: Cardiomyopathy ([place]) No date: CHF (congestive heart failure) (HCC)     Comment:  takes  Lasix and Aldactone daily No date: Coronary artery disease     Comment:  takes Plavix daily No date: Depression     Comment:  takes Zoloft daily No date: Diabetes mellitus without complication (HCC)     Comment:  Humulin R and Farxiga daily.Fasting blood sugar runs 140 No date: GERD (gastroesophageal reflux disease) No date: Headache No date: History of bronchitis No date: History of colon polyps     Comment:  benign No date: History of hiatal hernia No date: Hyperlipidemia     Comment:  takes Ecologist, and Zetia daily No date: Hypertension     Comment:  takes Ship broker and Coreg daily 2001: MI (myocardial infarction) ([place]) No date: Obesity No date: Peripheral neuropathy No date: Pneumonia     Comment:  history of-last time about 14 yrs ago No date: PONV (postoperative nausea and vomiting)     Comment:  after knee surgery 25 yrs ago b/p stayed elevated for a               while No date: Presence of permanent cardiac pacemaker No date: Shortness of breath dyspnea     Comment:  with exertion No date: Sleep apnea     Comment:  uses CPAP nightly No date: Ventricular tachycardia ([place])     Comment:  s/p RFCA PVCs 2013   Reproductive/Obstetrics                             Anesthesia Physical  Anesthesia Plan  ASA: 4  Anesthesia Plan: General   Post-op Pain Management:    Induction: Intravenous  PONV  Risk Score and Plan: 2 and Propofol infusion  Airway Management Planned: Natural Airway  Additional Equipment:   Intra-op Plan:   Post-operative Plan:   Informed Consent: I have reviewed the patients History and Physical, chart, labs and discussed the procedure including the risks, benefits and alternatives for the proposed anesthesia with the patient or authorized representative who has indicated his/her understanding and acceptance.     Dental advisory given  Plan Discussed with: CRNA and Anesthesiologist  Anesthesia Plan  Comments: (Patient consented for risks of anesthesia including but not limited to:  - adverse reactions to medications - risk of airway placement if required - damage to eyes, teeth, lips or other oral mucosa - nerve damage due to positioning  - sore throat or hoarseness - Damage to heart, brain, nerves, lungs, other parts of body or loss of life  Patient voiced understanding.)        Anesthesia Quick Evaluation

## 2021-10-06 NOTE — H&P (Signed)
Brian Bellows, MD [address], [place], [place], [place], [address] [address], [place], [place], [place], [address] Phone: [phone]  Fax: [phone]  Primary Care Physician:  Pleas Koch, NP   Pre-Procedure History & Physical: HPI:  Brian Williams is a 61 y.o. male is here for an colonoscopy.   Past Medical History:  Diagnosis Date   AICD (automatic cardioverter/defibrillator) present    Allergy    Anxiety    takes Xanax as needed   Appendicitis    Arthritis    back,knees,right shoulder   Back pain    Cardiomyopathy ([place])    CHF (congestive heart failure) (HCC)    takes Lasix and Aldactone daily   Coronary artery disease    takes Plavix daily   Depression    takes Zoloft daily   Diabetes mellitus without complication (HCC)    Humulin R and Farxiga daily.Fasting blood sugar runs 140   GERD (gastroesophageal reflux disease)    Headache    History of bronchitis    History of colon polyps    benign   History of hiatal hernia    Hyperlipidemia    takes Fenofibrate,Crestor, and Zetia daily   Hypertension    takes Entresto and Coreg daily   MI (myocardial infarction) ([place]) 2001   Obesity    Peripheral neuropathy    Pneumonia    history of-last time about 14 yrs ago   PONV (postoperative nausea and vomiting)    after knee surgery 25 yrs ago b/p stayed elevated for a while   Presence of permanent cardiac pacemaker    Shortness of breath dyspnea    with exertion   Sleep apnea    uses CPAP nightly   Ventricular tachycardia    s/p RFCA PVCs 2013    Past Surgical History:  Procedure Laterality Date   APPENDECTOMY     BACK SURGERY     CARDIAC CATHETERIZATION     CARDIAC CATHETERIZATION Left 05/10/2016   Procedure: Left Heart Cath and Coronary Angiography;  Surgeon: Minna Merritts, MD;  Location: [facility];  Service: Cardiovascular;  Laterality: Left;   CARDIAC DEFIBRILLATOR PLACEMENT  10/16/2007   ICD Model number 2207-36 serial  number [id number]   CARDIAC ELECTROPHYSIOLOGY STUDY AND ABLATION     CHOLECYSTECTOMY  2001   COLONOSCOPY     COLONOSCOPY WITH PROPOFOL N/A 08/28/2018   Procedure: COLONOSCOPY WITH PROPOFOL;  Surgeon: Brian Bellows, MD;  Location: [facility] ENDOS[facility];  Service: Gastroenterology;  Laterality: N/A;   CORONARY ANGIOPLASTY WITH STENT PLACEMENT     7 stents   CORONARY ARTERY BYPASS GRAFT N/A 05/24/2016   Procedure: CORONARY ARTERY BYPASS GRAFTING (CABG) x four , using left internal mammary artery and left leg greater saphenous vein harvested endoscopically;  Surgeon: Gaye Pollack, MD;  Location: [facility];  Service: Open Heart Surgery;  Laterality: N/A;   EP IMPLANTABLE DEVICE N/A 06/16/2015   Procedure: ICD Generator Changeout;  Surgeon: Deboraha Sprang, MD;  Location: [facility];  Service: Cardiovascular;  Laterality: N/A;   ESOPHAGOGASTRODUODENOSCOPY     INSERT / REPLACE / REMOVE PACEMAKER     KNEE SURGERY     bilateral knee    [name] WITHOUT CARDIOVERSION N/A 05/24/2016   Procedure: TRANSESOPHAGEAL ECHOCARDIOGRAM ([name]);  Surgeon: Gaye Pollack, MD;  Location: [facility];  Service: Open Heart Surgery;  Laterality: N/A;   VASECTOMY      Prior to Admission medications   Medication Sig Start Date  End Date Taking? Authorizing Provider  aspirin EC 81 MG tablet Take 81 mg by mouth daily.  06/21/16  Yes Minna Merritts, MD  clopidogrel (PLAVIX) 75 MG tablet Take 1 tablet (75 mg total) by mouth daily. 08/25/21  Yes Pleas Koch, NP  colchicine 0.6 MG tablet Take 0.6 mg by mouth daily. 09/04/21  Yes [provider]  escitalopram (LEXAPRO) 20 MG tablet TAKE 1 TABLET BY MOUTH  DAILY FOR ANXIETY AND  DEPRESSION 05/09/21  Yes Tonia Ghent, MD  ezetimibe (ZETIA) 10 MG tablet Take 1 tablet (10 mg total) by mouth daily. For cholesterol. 08/25/21  Yes Pleas Koch, NP  gabapentin (NEURONTIN) 600 MG tablet TAKE TWO TABLETS BY MOUTH TWICE A DAY FOR PAIN 05/28/21  Yes Pleas Koch, NP   gemfibrozil (LOPID) 600 MG tablet TAKE 1 TABLET( 600MG  TOTAL) BY MOUTH DAILY FOR CHOLESTEROL 08/25/21  Yes Pleas Koch, NP  insulin regular human CONCENTRATED (HUMULIN R) 500 UNIT/ML injection Inject 0-25 Units into the skin daily at 12 noon.   Yes [provider]  lidocaine (LIDODERM) 5 % Place 1 patch onto the skin daily. Remove & Discard patch within 12 hours or as directed by MD 09/10/21  Yes Fritzi Mandes, MD  methocarbamol (ROBAXIN) 750 MG tablet Take 1 tablet (750 mg total) by mouth every 8 (eight) hours as needed for muscle spasms. 06/22/21  Yes Gillis Santa, MD  metoprolol succinate (TOPROL-XL) 50 MG 24 hr tablet Take 50 mg by mouth daily. 03/14/21  Yes [provider]  nitroGLYCERIN (NITROSTAT) 0.4 MG SL tablet Place 1 tablet (0.4 mg total) under the tongue every 5 (five) minutes as needed for chest pain. 04/28/21  Yes Pleas Koch, NP  omega-3 acid ethyl esters (LOVAZA) 1 g capsule Take 1 g by mouth 2 (two) times daily. 03/22/21  Yes [provider]  oxyCODONE (OXY IR/ROXICODONE) 5 MG immediate release tablet Take 1 tablet (5 mg total) by mouth every 6 (six) hours as needed for severe pain. 09/09/21  Yes Fritzi Mandes, MD  rosuvastatin (CRESTOR) 20 MG tablet TAKE 1 TABLET BY MOUTH AT  BEDTIME FOR CHOLESTEROL 08/15/21  Yes Pleas Koch, NP  sacubitril-valsartan (ENTRESTO) 49-51 MG Take 1 tablet by mouth 2 (two) times daily.   Yes [provider]  Semaglutide, 2 MG/DOSE, (OZEMPIC, 2 MG/DOSE,) 8 MG/3ML SOPN Inject 2 mg into the skin every Friday.   Yes [provider]  spironolactone (ALDACTONE) 50 MG tablet Take 25 mg by mouth daily.   Yes [provider]  traZODone (DESYREL) 100 MG tablet TAKE 2 TABLETS BY MOUTH  EVERY NIGHT AT BEDTIME for sleep. 08/09/21  Yes Pleas Koch, NP  traZODone (DESYREL) 100 MG tablet Take 2 tablets (200 mg total) by mouth at bedtime. For sleep. 09/22/21  Yes Pleas Koch, NP    Allergies  as of 08/23/2021 - Review Complete 08/15/2021  Allergen Reaction Noted   Naproxen Other (See Comments), Anxiety, and Rash 09/18/1993   Morphine and related Nausea And Vomiting 05/16/2016    Family History  Problem Relation Age of Onset   Heart attack Mother 4   Hypertension Mother    Melanoma Mother    Heart attack Father 29   Hypertension Father    Hypertension Maternal Uncle    Heart disease Maternal Uncle    Heart disease Maternal Grandmother    Stroke Maternal Grandmother    Diabetes Neg Hx     Social History  Socioeconomic History   Marital status: Married    Spouse name: Robley Matassa   Number of children: 2   Years of education: Not on file   Highest education level: Not on file  Occupational History   Not on file  Tobacco Use   Smoking status: Former    Packs/day: 1.00    Years: 25.00    Pack years: 25.00    Types: Cigarettes    Quit date: 05/19/2018    Years since quitting: 3.3   Smokeless tobacco: Never  Vaping Use   Vaping Use: Never used  Substance and Sexual Activity   Alcohol use: No    Comment: rare   Drug use: No   Sexual activity: Not Currently  Other Topics Concern   Not on file  Social History Narrative   Married.   Moved from [place].   Disabled.   Social Determinants of Health   Financial Resource Strain: Low Risk    Difficulty of Paying Living Expenses: Not hard at all  Food Insecurity: No Food Insecurity   Worried About Charity fundraiser in the Last Year: Never true   [place] in the Last Year: Never true  Transportation Needs: No Transportation Needs   Lack of Transportation (Medical): No   Lack of Transportation (Non-Medical): No  Physical Activity: Inactive   Days of Exercise per Week: 0 days   Minutes of Exercise per Session: 0 min  Stress: No Stress Concern Present   Feeling of Stress : Not at all  Social Connections: Socially Isolated   Frequency of Communication with Friends and Family: Never    Frequency of Social Gatherings with Friends and Family: Never   Attends Religious Services: Never   Marine scientist or Organizations: No   Attends Music therapist: Never   Marital Status: Married  Human resources officer Violence: Not At Risk   Fear of Current or Ex-Partner: No   Emotionally Abused: No   Physically Abused: No   Sexually Abused: No    Review of Systems: See HPI, otherwise negative ROS  Physical Exam: BP 109/64    Pulse 83    Temp (!) 96.2 F (35.7 C) (Temporal)    Resp 20    Ht 6' (1.829 m)    Wt 115.7 kg    SpO2 99%    BMI 34.58 kg/m  General:   Alert,  pleasant and cooperative in NAD Head:  Normocephalic and atraumatic. Neck:  Supple; no masses or thyromegaly. Lungs:  Clear throughout to auscultation, normal respiratory effort.    Heart:  +S1, +S2, Regular rate and rhythm, No edema. Abdomen:  Soft, nontender and nondistended. Normal bowel sounds, without guarding, and without rebound.   Neurologic:  Alert and  oriented x4;  grossly normal neurologically.  Impression/Plan: Brian Williams is here for an colonoscopy to be performed for surveillance due to prior history of colon polyps   Risks, benefits, limitations, and alternatives regarding  colonoscopy have been reviewed with the patient.  Questions have been answered.  All parties agreeable.   Brian Bellows, MD  10/06/2021, 9:53 AM

## 2021-10-09 ENCOUNTER — Encounter: Payer: Self-pay | Admitting: Gastroenterology

## 2021-10-09 LAB — SURGICAL PATHOLOGY

## 2021-10-10 DIAGNOSIS — B079 Viral wart, unspecified: Secondary | ICD-10-CM | POA: Diagnosis not present

## 2021-10-10 DIAGNOSIS — L821 Other seborrheic keratosis: Secondary | ICD-10-CM | POA: Diagnosis not present

## 2021-10-10 DIAGNOSIS — L732 Hidradenitis suppurativa: Secondary | ICD-10-CM | POA: Diagnosis not present

## 2021-10-10 DIAGNOSIS — Z1283 Encounter for screening for malignant neoplasm of skin: Secondary | ICD-10-CM | POA: Diagnosis not present

## 2021-10-12 ENCOUNTER — Telehealth (INDEPENDENT_AMBULATORY_CARE_PROVIDER_SITE_OTHER): Payer: Medicare Other | Admitting: Primary Care

## 2021-10-12 ENCOUNTER — Encounter: Payer: Self-pay | Admitting: Primary Care

## 2021-10-12 ENCOUNTER — Other Ambulatory Visit: Payer: Self-pay

## 2021-10-12 VITALS — BP 99/54 | HR 79 | Ht 72.0 in | Wt 250.0 lb

## 2021-10-12 DIAGNOSIS — R5381 Other malaise: Secondary | ICD-10-CM | POA: Insufficient documentation

## 2021-10-12 DIAGNOSIS — F330 Major depressive disorder, recurrent, mild: Secondary | ICD-10-CM

## 2021-10-12 DIAGNOSIS — N10 Acute tubulo-interstitial nephritis: Secondary | ICD-10-CM | POA: Diagnosis not present

## 2021-10-12 DIAGNOSIS — R5382 Chronic fatigue, unspecified: Secondary | ICD-10-CM

## 2021-10-12 DIAGNOSIS — E118 Type 2 diabetes mellitus with unspecified complications: Secondary | ICD-10-CM | POA: Diagnosis not present

## 2021-10-12 DIAGNOSIS — D649 Anemia, unspecified: Secondary | ICD-10-CM

## 2021-10-12 DIAGNOSIS — F411 Generalized anxiety disorder: Secondary | ICD-10-CM

## 2021-10-12 NOTE — Assessment & Plan Note (Signed)
Likely multifactorial including recurrent illness, chronic anemia, and recent hospital visit. There may be some degree of depression.  Recent TSH within normal range.  We discussed several potential reasons for his fatigue.  We will start by repeating his CBC as his last hemoglobin level was 8.5 during hospital discharge.  Reviewed recent colonoscopy.  We will reduce his Lexapro to 10 mg daily to see if this also helps to reduce fatigue.  We did discuss the potential need to increase back up with his irritability/anger returns.  Consider cardiology input if anemia has improved and reduction of Lexapro does not help.  Await results.

## 2021-10-12 NOTE — Assessment & Plan Note (Signed)
Seems to be doing well on Lexapro 20 mg, however we will be reducing to 10 mg per his request.  We will follow-up later next month.  Start Lexapro 10 mg daily.

## 2021-10-12 NOTE — Assessment & Plan Note (Signed)
Unclear etiology at this point.  Reviewed colonoscopy from last week which did not show evidence of diverticula or bleeding.  Kidney function is within normal range.  Consider hematology referral. Repeat CBC and B12 level pending.

## 2021-10-12 NOTE — Progress Notes (Signed)
Patient ID: Brian Williams, male    DOB: 13-Aug-1961, 61 y.o.   MRN: [id number]  Virtual visit completed through [employer], a video enabled telemedicine application. Due to national recommendations of social distancing due to COVID-19, a virtual visit is felt to be most appropriate for this patient at this time. Reviewed limitations, risks, security and privacy concerns of performing a virtual visit and the availability of in person appointments. I also reviewed that there may be a patient responsible charge related to this service. The patient agreed to proceed.   Patient location: home Provider location: [place] at [facility], office Persons participating in this virtual visit: patient, provider   If any vitals were documented, they were collected by patient at home unless specified below.    BP (!) 99/54    Pulse 79    Ht 6' (1.829 m)    Wt 250 lb (113.4 kg)    BMI 33.91 kg/m    CC: Fatigue  Subjective:   HPI: Brian Williams is a 61 y.o. male with a significant medical history including uncontrolled type 2 diabetes, chronic back pain, hypertension, OSA on CPAP, CAD with CABG, GAD, MDD, CHF, ICD in place presenting on 10/12/2021 to discuss fatigue.  Chronic fatigue since October 2022.  He endorses recurrent illness since then, gets well for a few weeks and then sick again.   Admitted to [facility] for left pyelonephritis, acute cystitis, sepsis, acute gout, and pneumonia 09/06/21 - 09/09/21. Symptoms included fatigue, nausea, vomiting, diarrhea, cough, SOB.  He was found to be anemic with lowest hemoglobin of 8.5 just prior to discharge.  This was not addressed during his stay.    Since his hospital stay he's completed Cipro, colchicine, and Medrol Dose Pak which were prescribed at discharge. He's feeling much better since discharge. He received a knee injection one week after admission and is doing well.   Over the last several months he's noticed feeling "lethargic" and thinks it may be  secondary to the Lexapro. He doesn't really have emotions, feels flat and "blah", doesn't feel excited, doesn't feel happy. He has been taking Lexapro 20 mg daily for years.  The Lexapro has helped with his irritability and anger, "I do not fly off the handle like I used to".  He's taking vitamin B12 daily.  He has a history of chronic anemia, last CBC showed hemoglobin of 8.5 on 09/09/21 per hospital stay.  No repeat CBC since hospital stay.  He denies rectal bleeding.  He underwent colonoscopy on 10/06/2021, no mention of diverticula, ulcers, bleeding.  1 polyp was removed.  Follows with endocrinology for diabetes, A1C four weeks ago was 7.6. He is monitoring glucose readings via Dexcom, average glucose readings are in the 130's. He is compliant to his pump and Ozempic 2 mg weekly.       Relevant past medical, surgical, family and social history reviewed and updated as indicated. Interim medical history since our last visit reviewed. Allergies and medications reviewed and updated. Outpatient Medications Prior to Visit  Medication Sig Dispense Refill   aspirin EC 81 MG tablet Take 81 mg by mouth daily.  90 tablet    clopidogrel (PLAVIX) 75 MG tablet Take 1 tablet (75 mg total) by mouth daily. 90 tablet 0   escitalopram (LEXAPRO) 20 MG tablet TAKE 1 TABLET BY MOUTH  DAILY FOR ANXIETY AND  DEPRESSION     ezetimibe (ZETIA) 10 MG tablet Take 1 tablet (10 mg total) by mouth daily. For  cholesterol. 90 tablet 0   gabapentin (NEURONTIN) 600 MG tablet TAKE TWO TABLETS BY MOUTH TWICE A DAY FOR PAIN 360 tablet 1   gemfibrozil (LOPID) 600 MG tablet TAKE 1 TABLET( 600MG  TOTAL) BY MOUTH DAILY FOR CHOLESTEROL 90 tablet 0   insulin regular human CONCENTRATED (HUMULIN R) 500 UNIT/ML injection Inject 0-25 Units into the skin daily at 12 noon.     lidocaine (LIDODERM) 5 % Place 1 patch onto the skin daily. Remove & Discard patch within 12 hours or as directed by MD 30 patch 0   methocarbamol (ROBAXIN) 750 MG  tablet Take 1 tablet (750 mg total) by mouth every 8 (eight) hours as needed for muscle spasms. 90 tablet 5   metoprolol succinate (TOPROL-XL) 50 MG 24 hr tablet Take 50 mg by mouth daily.     omega-3 acid ethyl esters (LOVAZA) 1 g capsule Take 1 g by mouth 2 (two) times daily.     rosuvastatin (CRESTOR) 20 MG tablet TAKE 1 TABLET BY MOUTH AT  BEDTIME FOR CHOLESTEROL 90 tablet 0   sacubitril-valsartan (ENTRESTO) 49-51 MG Take 1 tablet by mouth 2 (two) times daily.     Semaglutide, 2 MG/DOSE, (OZEMPIC, 2 MG/DOSE,) 8 MG/3ML SOPN Inject 2 mg into the skin every Friday.     spironolactone (ALDACTONE) 50 MG tablet Take 25 mg by mouth daily.     traZODone (DESYREL) 100 MG tablet TAKE 2 TABLETS BY MOUTH  EVERY NIGHT AT BEDTIME for sleep. 180 tablet 3   colchicine 0.6 MG tablet Take 0.6 mg by mouth daily. (Patient not taking: Reported on 10/12/2021)     nitroGLYCERIN (NITROSTAT) 0.4 MG SL tablet Place 1 tablet (0.4 mg total) under the tongue every 5 (five) minutes as needed for chest pain. 25 tablet 1   oxyCODONE (OXY IR/ROXICODONE) 5 MG immediate release tablet Take 1 tablet (5 mg total) by mouth every 6 (six) hours as needed for severe pain. (Patient not taking: Reported on 10/12/2021) 20 tablet 0   traZODone (DESYREL) 100 MG tablet Take 2 tablets (200 mg total) by mouth at bedtime. For sleep. 60 tablet 0   No facility-administered medications prior to visit.     Per HPI unless specifically indicated in ROS section below Review of Systems  Constitutional:  Negative for appetite change.  Gastrointestinal:  Negative for blood in stool.  Psychiatric/Behavioral:  The patient is not nervous/anxious.        See HPI  Objective:  BP (!) 99/54    Pulse 79    Ht 6' (1.829 m)    Wt 250 lb (113.4 kg)    BMI 33.91 kg/m   Wt Readings from Last 3 Encounters:  10/12/21 250 lb (113.4 kg)  10/06/21 255 lb (115.7 kg)  09/06/21 270 lb (122.5 kg)       Physical exam: General: Alert and oriented x 3, no distress,  does not appear sickly  Pulmonary: Speaks in complete sentences without increased work of breathing, no cough during visit.  Psychiatric: Normal mood, thought content, and behavior.     Results for orders placed or performed during the hospital encounter of 10/06/21  Glucose, capillary  Result Value Ref Range   Glucose-Capillary 97 70 - 99 mg/dL   Comment 1 IN [facility]   Surgical pathology  Result Value Ref Range   SURGICAL PATHOLOGY      SURGICAL PATHOLOGY CASE: [id number] PATIENT: Worthy Keeler Surgical Pathology Report     Specimen Submitted: A. Colon polyp, ascending; cold snare  Clinical History: Personal history of colon polyps Z86.010.  Colon polyp    DIAGNOSIS: A. COLON POLYP, ASCENDING; COLD SNARE: - MULTIPLE FRAGMENTS OF TUBULAR ADENOMA. - ABUNDANT FECAL MATERIAL. - NEGATIVE FOR HIGH-GRADE DYSPLASIA AND MALIGNANCY.  GROSS DESCRIPTION: A. Labeled: Ascending colon polyp cold snare Received: Formalin Collection time: 10:17 AM on 10/06/2021 Placed into formalin time: 10:17 AM on 10/06/2021 Tissue fragment(s): Multiple Size: Aggregate, 2.0 x 1.0 x 0.1 cm Description: Received are tan soft tissue fragments, admixed with intestinal debris.  The ratio of soft tissue to intestinal debris is 10: 90. Entirely submitted in 1 cassette.  CM 10/06/2021  Final Diagnosis performed by Allena Napoleon, MD.   Electronically signed 10/09/2021 9:06:56AM The electronic signature indicates that the name d Attending Pathologist has evaluated the specimen Technical component performed at [facility], [address], [place], [place] [address] Lab: [phone] Dir: Rush Farmer, MD, MMM  Professional component performed at [facility], [facility], [place], [place], [place] [address] Lab: [phone] Dir: Kathi Simpers, MD    Assessment & Plan:   Problem List Items Addressed This Visit       Endocrine   Type 2 diabetes mellitus with complications ([place])     Reviewed A1c from recent hospital visit.  Overall glucose readings appear stable. Continue Ozempic 2 mg weekly and insulin pump per endocrinology.          Genitourinary   Acute pyelonephritis    Reviewed hospital notes, labs, imaging from visit during December 21 through September 09 2021.  Recovered. We will be rechecking CBC given anemia.        Other   Generalized anxiety disorder    Seems to be doing well on Lexapro 20 mg, however we will be reducing to 10 mg per his request.  We will follow-up later next month.  Start Lexapro 10 mg daily.      Major depressive disorder    Depression could be contributing to fatigue, however he would like to reduce his Lexapro dose.  Will trial a reduction of Lexapro to 10 mg daily.  He will follow-up next month.      Chronic fatigue    Likely multifactorial including recurrent illness, chronic anemia, and recent hospital visit. There may be some degree of depression.  Recent TSH within normal range.  We discussed several potential reasons for his fatigue.  We will start by repeating his CBC as his last hemoglobin level was 8.5 during hospital discharge.  Reviewed recent colonoscopy.  We will reduce his Lexapro to 10 mg daily to see if this also helps to reduce fatigue.  We did discuss the potential need to increase back up with his irritability/anger returns.  Consider cardiology input if anemia has improved and reduction of Lexapro does not help.  Await results.      Chronic anemia - Primary    Unclear etiology at this point.  Reviewed colonoscopy from last week which did not show evidence of diverticula or bleeding.  Kidney function is within normal range.  Consider hematology referral. Repeat CBC and B12 level pending.      Relevant Orders   CBC with Differential/Platelet   Vitamin B12     No orders of the defined types were placed in this encounter.  Orders Placed This Encounter  Procedures   CBC with  Differential/Platelet    Standing Status:   Future    Standing Expiration Date:   10/12/2022   Vitamin B12    Standing Status:  Future    Standing Expiration Date:   10/12/2022    I discussed the assessment and treatment plan with the patient. The patient was provided an opportunity to ask questions and all were answered. The patient agreed with the plan and demonstrated an understanding of the instructions. The patient was advised to call back or seek an in-person evaluation if the symptoms worsen or if the condition fails to improve as anticipated.  Follow up plan:  We reduced your Lexapro to 10 mg, take 1/2 tablet of your 20 mg dose.   We will call you to get a lab appointment set up to retest your blood levels.  It was a pleasure to see you today! '  Pleas Koch, NP

## 2021-10-12 NOTE — Assessment & Plan Note (Signed)
Reviewed A1c from recent hospital visit.  Overall glucose readings appear stable. Continue Ozempic 2 mg weekly and insulin pump per endocrinology.

## 2021-10-12 NOTE — Assessment & Plan Note (Signed)
Reviewed hospital notes, labs, imaging from visit during December 21 through September 09 2021.  Recovered. We will be rechecking CBC given anemia.

## 2021-10-12 NOTE — Assessment & Plan Note (Signed)
Depression could be contributing to fatigue, however he would like to reduce his Lexapro dose.  Will trial a reduction of Lexapro to 10 mg daily.  He will follow-up next month.

## 2021-10-12 NOTE — Patient Instructions (Signed)
We reduced your Lexapro to 10 mg, take 1/2 tablet of your 20 mg dose.   We will call you to get a lab appointment set up to retest your blood levels.  It was a pleasure to see you today!

## 2021-10-19 DIAGNOSIS — I5022 Chronic systolic (congestive) heart failure: Secondary | ICD-10-CM | POA: Diagnosis not present

## 2021-10-19 DIAGNOSIS — E1142 Type 2 diabetes mellitus with diabetic polyneuropathy: Secondary | ICD-10-CM | POA: Diagnosis not present

## 2021-10-19 DIAGNOSIS — I10 Essential (primary) hypertension: Secondary | ICD-10-CM | POA: Diagnosis not present

## 2021-10-19 DIAGNOSIS — G4733 Obstructive sleep apnea (adult) (pediatric): Secondary | ICD-10-CM | POA: Diagnosis not present

## 2021-10-19 DIAGNOSIS — E669 Obesity, unspecified: Secondary | ICD-10-CM | POA: Diagnosis not present

## 2021-10-19 DIAGNOSIS — Z95810 Presence of automatic (implantable) cardiac defibrillator: Secondary | ICD-10-CM | POA: Diagnosis not present

## 2021-10-19 DIAGNOSIS — I25118 Atherosclerotic heart disease of native coronary artery with other forms of angina pectoris: Secondary | ICD-10-CM | POA: Diagnosis not present

## 2021-10-19 DIAGNOSIS — Z6835 Body mass index (BMI) 35.0-35.9, adult: Secondary | ICD-10-CM | POA: Diagnosis not present

## 2021-10-19 DIAGNOSIS — I2510 Atherosclerotic heart disease of native coronary artery without angina pectoris: Secondary | ICD-10-CM | POA: Diagnosis not present

## 2021-10-19 DIAGNOSIS — Z794 Long term (current) use of insulin: Secondary | ICD-10-CM | POA: Diagnosis not present

## 2021-10-23 DIAGNOSIS — I5022 Chronic systolic (congestive) heart failure: Secondary | ICD-10-CM | POA: Diagnosis not present

## 2021-10-23 DIAGNOSIS — I2510 Atherosclerotic heart disease of native coronary artery without angina pectoris: Secondary | ICD-10-CM | POA: Diagnosis not present

## 2021-10-30 ENCOUNTER — Telehealth: Payer: Self-pay

## 2021-10-30 NOTE — Chronic Care Management (AMB) (Signed)
° ° °  Chronic Care Management Pharmacy Assistant   Name: Brian Williams  MRN: [id number] DOB: [birth year]   Reason for Encounter: Reminder Call   Conditions to be addressed/monitored: HTN, HLD, and DMII    Medications: Outpatient Encounter Medications as of 10/30/2021  Medication Sig Note   aspirin EC 81 MG tablet Take 81 mg by mouth daily.     clopidogrel (PLAVIX) 75 MG tablet Take 1 tablet (75 mg total) by mouth daily.    colchicine 0.6 MG tablet Take 0.6 mg by mouth daily. (Patient not taking: Reported on 10/12/2021)    escitalopram (LEXAPRO) 20 MG tablet TAKE 1 TABLET BY MOUTH  DAILY FOR ANXIETY AND  DEPRESSION    ezetimibe (ZETIA) 10 MG tablet Take 1 tablet (10 mg total) by mouth daily. For cholesterol.    gabapentin (NEURONTIN) 600 MG tablet TAKE TWO TABLETS BY MOUTH TWICE A DAY FOR PAIN    gemfibrozil (LOPID) 600 MG tablet TAKE 1 TABLET( 600MG  TOTAL) BY MOUTH DAILY FOR CHOLESTEROL    insulin regular human CONCENTRATED (HUMULIN R) 500 UNIT/ML injection Inject 0-25 Units into the skin daily at 12 noon. 09/06/2021: Pt can't remember anything about the humulin R since he has been sick.    lidocaine (LIDODERM) 5 % Place 1 patch onto the skin daily. Remove & Discard patch within 12 hours or as directed by MD    methocarbamol (ROBAXIN) 750 MG tablet Take 1 tablet (750 mg total) by mouth every 8 (eight) hours as needed for muscle spasms. 09/06/2021: LF 09/01/2021 750 MG TABS (disp 90, 30d supply)    metoprolol succinate (TOPROL-XL) 50 MG 24 hr tablet Take 50 mg by mouth daily.    nitroGLYCERIN (NITROSTAT) 0.4 MG SL tablet Place 1 tablet (0.4 mg total) under the tongue every 5 (five) minutes as needed for chest pain.    omega-3 acid ethyl esters (LOVAZA) 1 g capsule Take 1 g by mouth 2 (two) times daily.    oxyCODONE (OXY IR/ROXICODONE) 5 MG immediate release tablet Take 1 tablet (5 mg total) by mouth every 6 (six) hours as needed for severe pain. (Patient not taking: Reported on 10/12/2021)     rosuvastatin (CRESTOR) 20 MG tablet TAKE 1 TABLET BY MOUTH AT  BEDTIME FOR CHOLESTEROL    sacubitril-valsartan (ENTRESTO) 49-51 MG Take 1 tablet by mouth 2 (two) times daily.    Semaglutide, 2 MG/DOSE, (OZEMPIC, 2 MG/DOSE,) 8 MG/3ML SOPN Inject 2 mg into the skin every Friday.    spironolactone (ALDACTONE) 50 MG tablet Take 25 mg by mouth daily.    traZODone (DESYREL) 100 MG tablet TAKE 2 TABLETS BY MOUTH  EVERY NIGHT AT BEDTIME for sleep.    No facility-administered encounter medications on file as of 10/30/2021.   Brian Williams was contacted to remind of upcoming telephone visit with Brian Williams on 11/02/21 at 1:00pm. Patient was reminded to have all medications, supplements and any blood glucose and blood pressure readings available for review at appointment. If unable to reach, a voicemail was left for patient.    Are you having any problems with your medications? No   Do you have any concerns you like to discuss with the pharmacist? No    Star Rating Drugs: Medication:  Last Fill: Day Supply Entresto 49-51mg  08/31/21 90 Rosuvastatin 20mg  08/18/21 90 Ozempic 2mg    Brian Williams,RPH  CPP notified  Brian Williams, [name] [phone]  Total time spent for month CPA: 10 min

## 2021-11-02 ENCOUNTER — Telehealth: Payer: Self-pay | Admitting: Pharmacist

## 2021-11-02 ENCOUNTER — Telehealth: Payer: Medicare Other

## 2021-11-02 NOTE — Telephone Encounter (Signed)
°  Chronic Care Management   Outreach Note  11/02/2021 Name: Brian Williams MRN: [id number] DOB: 05-02-1961  Referred by: Pleas Koch, NP  Patient had a phone appointment scheduled with clinical pharmacist today.  An unsuccessful telephone outreach was attempted today. The patient was referred to the pharmacist for assistance with medications, care management and care coordination.   Patient will NOT be penalized in any way for missing a CCM appointment. The no-show fee does not apply.  If possible, a message was left to return call to: [phone] or to [facility].  [name], PharmD, BCACP Clinical Pharmacist [facility] at [facility] [phone]

## 2021-11-02 NOTE — Progress Notes (Unsigned)
Chronic Care Management Pharmacy Note  11/02/2021 Name:  Brian Williams MRN:  [id number] DOB:  12-24-1960  Summary: -Home BG, BP improved since last visit -Pt is due for repeat lipid panel (last 07/2020). He is overdue for rosuvastatin refill but claims he has another 2-3 week supply remaining. He has CPE scheduled 09/20/21.  Recommendations/Changes made from today's visit: -Coordinate refill for rosuvastatin 20 mg (will run out before appt with PCP)  Plan: -[facility] will call patient in 1 month for statin adherence -Pharmacist follow up televisit scheduled for 3 months   Subjective: Brian Williams is an 61 y.o. year old male who is a primary patient of Brian Koch, NP.  The CCM team was consulted for assistance with disease management and care coordination needs.    Engaged with patient by telephone for follow up visit in response to provider referral for pharmacy case management and/or care coordination services.   Consent to Services:  The patient was given information about Chronic Care Management services, agreed to services, and gave verbal consent prior to initiation of services.  Please see initial visit note for detailed documentation.   Patient Care Team: Brian Koch, NP as PCP - General (Internal Medicine) Brian Williams Brian November, MD as PCP - Cardiology (Cardiology) Brian Merritts, MD as Consulting Physician (Cardiology) Brian Knee Ladell Pier, MD as Consulting Physician (Cardiology) Brian Rushing, MD as Referring Physician (Dermatology) Brian Williams, [facility] as Pharmacist (Pharmacist) Brian Williams, OD (Optometry)  Patient lives at home with his wife and 2 adult children. He is from [place] area, moved to [place] after he was put on disability and cost of living in [place] became too high. 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   Recent office visits: 10/12/21 NP Alma Friendly VV: c/ lethargy for several months; pt  requested to reduce lexapro to 10 mg. Repeat CBC (last HGB 8.5) F/u 1 month.   Recent consult visits: 10/19/21 Dr Luana Shu (cardiology): increase metoprolol to 75 mg (1.5 tab) 10/06/21 Admission for colonoscopy 08/24/21 NP Billey Chang (cardiology): f/u CAD, CHF. No med changes 08/07/21 Dr Candelaria Stagers [facility]): f/u SI arthritis 04/27/21 Dr Morton Stall [facility] cardiology): increase Delene Loll to 1/2 tab BID; restart spironolactone 12.5 mg daily if BP ok; pt is off SGLT2 d/t recurrent yeast infection  04/20/21 Dr Celine Ahr (Gen surgery): f/u appendicitis. Referred to [facility] for surgery. 03/21/21 Dr Brian Williams [facility] cardiology): f/u; d/c carvedilol, Welchol, Jardiance, Novolog d/t pt not taking. 02/24/21 Dr Selinda Flavin [facility] endocrine): decrease insulin by 10% due to hypoglycemia; current TDD ~90 units, 50-60% decrease from 1 year prior 02/07/21 Dr Roland Rack (ortho): f/u bilateral knee pain - received bilateral steroid injections 12/28/20 PA Woodard ([facility]): eval dysentery; diarrhea resolved.  Hospital visits: Medication Reconciliation was completed by comparing discharge summary, patients EMR and Pharmacy list, and upon discussion with patient.  Admitted to the hospital on 09/06/21 due to Sepsis. Discharge date was 09/09/21. Discharged from [facility].   -Klebsiella sepsis 2/2 pyelonephritis and pneumonia  New?Medications Started at [facility] Discharge:?? -started Cipro due to sepsis -resume medrol dosepak for gout (Uric acid 12.5)  Medications that remain the same after Hospital Discharge:??  -All other medications will remain the same.    Admitted to the hospital on 06/05/2021 due to acute appendicitis. Patient had Laparoscopic appendectomy.  Discharge date was 06/05/2021. Discharged from [facility].    Admitted to the hospital on 04/03/21 due to Acute appendicitis. Discharge date was 04/06/21. Discharged  from [facility].   -underwent conservative mgmt due to cardiac issues  Admitted to the hospital on  12/06/20 due to Dehydration/AKI s/o diarhea. Discharge date was 12/08/20. Discharged from [facility].    Objective:  Lab Results  Component Value Date   CREATININE 0.70 09/09/2021   BUN 23 (H) 09/09/2021   GFR 97.36 07/13/2021   GFRNONAA >60 09/09/2021   GFRAA 111 04/05/2020   NA 130 (L) 09/09/2021   K 3.6 09/09/2021   CALCIUM 8.4 (L) 09/09/2021   CO2 23 09/09/2021   GLUCOSE 206 (H) 09/09/2021    Lab Results  Component Value Date/Time   HGBA1C 7.6 (H) 09/07/2021 05:29 AM   HGBA1C 6.5 02/24/2021 12:00 AM   HGBA1C 6.8 (H) 12/06/2020 09:20 PM   GFR 97.36 07/13/2021 01:10 PM   GFR 81.43 07/22/2020 08:59 AM    Last diabetic Eye exam:  Lab Results  Component Value Date/Time   HMDIABEYEEXA Retinopathy (A) 05/12/2021 12:00 AM    Last diabetic Foot exam: No results found for: HMDIABFOOTEX   Lab Results  Component Value Date   CHOL 125 07/22/2020   HDL 32.10 (L) 07/22/2020   LDLCALC 65 07/22/2020   LDLDIRECT 42.0 07/21/2018   TRIG 139.0 07/22/2020   CHOLHDL 4 07/22/2020    Hepatic Function Latest Ref Rng & Units 09/06/2021 07/13/2021 04/04/2021  Total Protein 6.5 - 8.1 g/dL 7.2 7.3 6.3(L)  Albumin 3.5 - 5.0 g/dL 3.6 4.3 3.7  AST 15 - 41 U/L '25 26 16  ' ALT 0 - 44 U/L '21 28 18  ' Alk Phosphatase 38 - 126 U/L 45 44 30(L)  Total Bilirubin 0.3 - 1.2 mg/dL 0.8 0.3 0.9  Bilirubin, Direct 0.00 - 0.40 mg/dL - - -    Lab Results  Component Value Date/Time   TSH 1.751 09/07/2021 05:29 AM   TSH 3.804 12/07/2020 05:58 AM   TSH 3.210 06/03/2015 11:54 AM    CBC Latest Ref Rng & Units 09/09/2021 09/08/2021 09/07/2021  WBC 4.0 - 10.5 K/uL 3.1(L) 4.8 4.1  Hemoglobin 13.0 - 17.0 g/dL 8.5(L) 8.9(L) 9.3(L)  Hematocrit 39.0 - 52.0 % 23.8(L) 24.8(L) 26.4(L)  Platelets 150 - 400 K/uL 113(L) 125(L) 95(L)   Iron/TIBC/Ferritin/ %Sat    Component Value Date/Time   IRON 123 07/25/2021 1138   TIBC 484.4 (H) 07/25/2021 1138   FERRITIN 138.7 07/25/2021 1138   IRONPCTSAT 25.4 07/25/2021  1138    No results found for: VD25OH  Clinical ASCVD: Yes  The ASCVD Risk score (Arnett DK, et al., 2019) failed to calculate for the following reasons:   The patient has a prior MI or stroke diagnosis    Depression screen [facility] 2/9 07/21/2021 05/25/2021 02/17/2020  Decreased Interest 0 0 1  Down, Depressed, Hopeless 0 0 1  PHQ - 2 Score 0 0 2  Altered sleeping - - 0  Tired, decreased energy - - 3  Change in appetite - - 3  Feeling bad or failure about yourself  - - 3  Trouble concentrating - - 1  Moving slowly or fidgety/restless - - 3  Suicidal thoughts - - 1  PHQ-9 Score - - 16  Difficult doing work/chores - - Somewhat difficult  Some recent data might be hidden    GAD 7 : Generalized Anxiety Score 07/23/2019 07/08/2017  Nervous, Anxious, on Edge 2 3  Control/stop worrying 3 3  Worry too much - different things 3 3  Trouble relaxing 1 3  Restless 1 3  Easily annoyed or irritable  1 1  Afraid - awful might happen 1 3  Total GAD 7 Score 12 19  Anxiety Difficulty Somewhat difficult Somewhat difficult    Social History   Tobacco Use  Smoking Status Former   Packs/day: 1.00   Years: 25.00   Pack years: 25.00   Types: Cigarettes   Quit date: 05/19/2018   Years since quitting: 3.4  Smokeless Tobacco Never   BP Readings from Last 3 Encounters:  10/12/21 (!) 99/54  10/06/21 115/64  09/09/21 91/80   Pulse Readings from Last 3 Encounters:  10/12/21 79  10/06/21 65  09/09/21 79   Wt Readings from Last 3 Encounters:  10/12/21 250 lb (113.4 kg)  10/06/21 255 lb (115.7 kg)  09/06/21 270 lb (122.5 kg)   BMI Readings from Last 3 Encounters:  10/12/21 33.91 kg/m  10/06/21 34.58 kg/m  09/06/21 36.62 kg/m    Assessment/Interventions: Review of patient past medical history, allergies, medications, health status, including review of consultants reports, laboratory and other test data, was performed as part of comprehensive evaluation and provision of chronic care management  services.   SDOH:  (Social Determinants of Health) assessments and interventions performed: Yes  SDOH Screenings   Alcohol Screen: Low Risk    Last Alcohol Screening Score (AUDIT): 0  Depression (PHQ2-9): Low Risk    PHQ-2 Score: 0  Financial Resource Strain: Low Risk    Difficulty of Paying Living Expenses: Not hard at all  Food Insecurity: No Food Insecurity   Worried About Charity fundraiser in the Last Year: Never true   Ran Out of Food in the Last Year: Never true  Housing: Low Risk    Last Housing Risk Score: 0  Physical Activity: Inactive   Days of Exercise per Week: 0 days   Minutes of Exercise per Session: 0 min  Social Connections: Socially Isolated   Frequency of Communication with Friends and Family: Never   Frequency of Social Gatherings with Friends and Family: Never   Attends Religious Services: Never   Marine scientist or Organizations: No   Attends Music therapist: Never   Marital Status: Married  Stress: No Stress Concern Present   Feeling of Stress : Not at all  Tobacco Use: Medium Risk   Smoking Tobacco Use: Former   Smokeless Tobacco Use: Never   Passive Exposure: Not on Pensions consultant Needs: No Transportation Needs   Lack of Transportation (Medical): No   Lack of Transportation (Non-Medical): No    CCM Care Plan  Allergies  Allergen Reactions   Naproxen Other (See Comments), Anxiety and Rash    Causes hyperactivity causes hyperactivity   Morphine And Related Nausea And Vomiting    Morphine only    Medications Reviewed Today     Reviewed by Brian Koch, NP (Nurse Practitioner) on 10/12/21 at [place] List Status: <None>   Medication Order Taking? Sig Documenting Provider Last Dose Status Informant  aspirin EC 81 MG tablet [id number] Yes Take 81 mg by mouth daily.  Brian Merritts, MD Taking Active Self, Pharmacy Records  clopidogrel (PLAVIX) 75 MG tablet [id number] Yes Take 1 tablet (75 mg total) by mouth  daily. Brian Koch, NP Taking Active Self, Pharmacy Records  colchicine 0.6 MG tablet [id number] No Take 0.6 mg by mouth daily.  Patient not taking: Reported on 10/12/2021   [provider] Not Taking Active Self, Pharmacy Records  escitalopram (LEXAPRO) 20 MG tablet [id number] Yes TAKE 1 TABLET  BY MOUTH  DAILY FOR ANXIETY AND  DEPRESSION Tonia [place], MD Taking Active Self, Pharmacy Records  ezetimibe (ZETIA) 10 MG tablet [id number] Yes Take 1 tablet (10 mg total) by mouth daily. For cholesterol. Brian Koch, NP Taking Active Self, Pharmacy Records  gabapentin (NEURONTIN) 600 MG tablet [id number] Yes TAKE TWO TABLETS BY MOUTH TWICE A DAY FOR PAIN Brian Koch, NP Taking Active Self, Pharmacy Records  gemfibrozil (LOPID) 600 MG tablet [id number] Yes TAKE 1 TABLET( 600MG TOTAL) BY MOUTH DAILY FOR CHOLESTEROL Brian Koch, NP Taking Active Self, Pharmacy Records  insulin regular human CONCENTRATED (HUMULIN R) 500 UNIT/ML injection [id number] Yes Inject 0-25 Units into the skin daily at 12 noon. [provider] Taking Active Self, Pharmacy Records           Med Note Fanny Dance, [place] I   Wed Sep 06, 2021  7:20 PM) Pt can't remember anything about the humulin R since he has been sick.   lidocaine (LIDODERM) 5 % [id number] Yes Place 1 patch onto the skin daily. Remove & Discard patch within 12 hours or as directed by MD Fritzi Mandes, MD Taking Active   methocarbamol (ROBAXIN) 750 MG tablet [id number] Yes Take 1 tablet (750 mg total) by mouth every 8 (eight) hours as needed for muscle spasms. Gillis Santa, MD Taking Active Self, Pharmacy Records           Med Note Fanny Dance, [place] I   Wed Sep 06, 2021  7:21 PM) LF 09/01/2021 750 MG TABS (disp 90, 30d supply)   metoprolol succinate (TOPROL-XL) 50 MG 24 hr tablet [id number] Yes Take 50 mg by mouth daily. [provider] Taking Active Self, Pharmacy Records  nitroGLYCERIN (NITROSTAT) 0.4 MG SL tablet [id number]   Place 1 tablet (0.4 mg total) under the tongue every 5 (five) minutes as needed for chest pain. Brian Koch, NP  Active Self, Pharmacy Records  omega-3 acid ethyl esters (LOVAZA) 1 g capsule [id number] Yes Take 1 g by mouth 2 (two) times daily. [provider] Taking Active Self, Pharmacy Records  oxyCODONE (OXY IR/ROXICODONE) 5 MG immediate release tablet [id number] No Take 1 tablet (5 mg total) by mouth every 6 (six) hours as needed for severe pain.  Patient not taking: Reported on 10/12/2021   Fritzi Mandes, MD Not Taking Active   rosuvastatin (CRESTOR) 20 MG tablet [id number] Yes TAKE 1 TABLET BY MOUTH AT  BEDTIME FOR CHOLESTEROL Brian Koch, NP Taking Active Self, Pharmacy Records  sacubitril-valsartan [facility] 49-51 MG [id number] Yes Take 1 tablet by mouth 2 (two) times daily. [provider] Taking Active Self, Pharmacy Records  Semaglutide, 2 MG/DOSE, (OZEMPIC, 2 MG/DOSE,) 8 MG/3ML SOPN [id number] Yes Inject 2 mg into the skin every Friday. [provider] Taking Active Self, Pharmacy Records  spironolactone (ALDACTONE) 50 MG tablet [id number] Yes Take 25 mg by mouth daily. [provider] Taking Active Self, Pharmacy Records  traZODone (DESYREL) 100 MG tablet [id number] Yes TAKE 2 TABLETS BY MOUTH  EVERY NIGHT AT BEDTIME for sleep. Brian Koch, NP Taking Active Self, Pharmacy Records            Patient Active Problem List   Diagnosis Date Noted   Chronic fatigue 10/12/2021   Chronic anemia 10/12/2021   Acute pyelonephritis 09/07/2021   Multifocal pneumonia 09/07/2021   Bacteremia due to Klebsiella pneumoniae 09/07/2021   Sepsis ([place]) 09/06/2021   Viral illness 07/13/2021   Lumbar degenerative disc disease 05/11/2021   Lumbar facet  arthropathy 05/11/2021   Lumbar post-laminectomy syndrome 05/11/2021   Chronic pain syndrome 05/11/2021   Chronic SI joint pain 05/11/2021   Low back pain 05/10/2021   Acute appendicitis 04/03/2021    Depression 04/03/2021   Diarrhea 12/06/2020   Hypotension 12/06/2020   Tingling 12/01/2020   Vision changes 12/01/2020   Dizziness 11/23/2020   Sinusitis, acute 08/08/2020   Diabetic neuropathy associated with type 2 diabetes mellitus ([place]) 08/05/2020   Falls, initial encounter 08/05/2020   Leg pain, bilateral 08/05/2020   Sensory ataxia 08/05/2020   Right knee pain 07/25/2020   Myalgia 01/28/2019   Major depressive disorder 07/08/2017   Erectile dysfunction 07/08/2017   Hx of CABG 07/26/2016   Coronary artery disease 05/24/2016   Atherosclerosis of native coronary artery of native heart with stable angina pectoris ([place])    History of coronary artery stent placement    Unstable angina ([place]) 05/04/2016   Obesity (BMI 30-39.9) 04/06/2016   OSA on CPAP 01/06/2016   Diabetes, polyneuropathy ([place]) 01/06/2016   Hidradenitis suppurativa 10/06/2015   Chronic back pain 05/02/2015   Essential hypertension 05/02/2015   Hyperlipidemia 05/02/2015   Generalized anxiety disorder 05/02/2015   ICD (implantable cardioverter-defibrillator) in place 04/20/2015   Congestive dilated cardiomyopathy ([place]) 04/20/2015   Type 2 diabetes mellitus with complications ([place]) 30/03/6225   Chronic systolic heart failure ([place]) 09/30/2014    Immunization History  Administered Date(s) Administered   Influenza Inj Mdck Quad Pf 06/25/2018, 06/05/2019   Influenza Split 07/22/2015   Influenza,inj,Quad PF,6+ Mos 06/13/2016, 06/24/2020   Influenza-Unspecified 06/25/2018, 06/06/2019   Moderna Sars-Covid-2 Vaccination 10/31/2019, 11/21/2019   Pneumococcal Polysaccharide-23 07/21/2018   Td 04/17/2018   Tdap 02/22/2018    Conditions to be addressed/monitored:  Hypertension, Hyperlipidemia, Diabetes, Heart Failure, Coronary Artery Disease, Depression, and Anxiety  There are no care plans that you recently modified to display for this patient.     Medication Assistance:  [place] - via [employer] endocrine  ([facility] mails to pt) Humulin R U-500 - [employer] - via [employer] endocrine [employer] - via cardiology  Compliance/Adherence/Medication fill history: Care Gaps: Foot exam (due 02/03/21)  Star-Rating Drugs: Rosuvastatin - LF 04/20/21 x 90 ds. [place] 53%. Pt in need of refill, requested from PCP.  Patient's preferred pharmacy is:  [facility] ([place]) [place], [place] [address] Phone: [phone] Fax: [phone]  [facility] [id number] [place], [place] [facility] AT [facility] [address] [place] [address] Phone: [phone] Fax: [phone]  Uses pill box? Yes Pt endorses 100% compliance  We discussed: Current pharmacy is preferred with insurance plan and patient is satisfied with pharmacy services Patient decided to: Continue current medication management strategy  Care Plan and Follow Up Patient Decision:  Patient agrees to Care Plan and Follow-up.  Plan: Telephone follow up appointment with care management team member scheduled for:  3 months  [name], PharmD, [facility] Clinical Pharmacist [facility] [phone]   Current Barriers:  Unable to independently monitor therapeutic efficacy Unable to maintain control of blood sugars  Pharmacist Clinical Goal(s):  Patient will achieve adherence to monitoring guidelines and medication adherence to achieve therapeutic efficacy maintain control of blood sugars as evidenced by Dexcom G6  through collaboration with PharmD and provider.   Interventions: 1:1 collaboration with Brian Koch, NP regarding development and update of comprehensive plan of care  as evidenced by provider attestation and co-signature Inter-disciplinary care team collaboration (see longitudinal plan of care) Comprehensive medication review performed; medication list updated in electronic medical  record  Hyperlipidemia / CAD (LDL goal < 70) -Controlled - LDL is at goal; annual lipid panel is due; Trig are elevated but < 500 most recently, previously in [place]. Pt endorses adherence with medications as prescribed He reports Lovaza (generic) is expensive >$100 for 3 month supply -Pt is aware of drug interaction with rosuvastatin and gemfibrozil - pt does endorse hx of leg pain -Pt cut himself shaving, had to ER when it wouldn't stop bleeding 11/12  -Current treatment: Ezetimibe 10 mg daily Gemfibrozil 600 mg daily Rosuvastatin 20 mg daily Clopidogrel 75 mg daily Nitroglycerin 0.4 mg SL prn Lovaza 1 g - 2 cap BID OTC Fish oil 1000 mg daily Aspirin 81 mg daily -Current exercise habits: water aerobics few times a week -Educated on Cholesterol goals; Benefits of statin for ASCVD risk reduction; -Discussed gemfibrozil may increase risk for myopathy with rosuvastatin; pt currently tolerating reasonably well, he has significant hypertriglyceridemia and requires multiple therapies for this -due for annual lipid panel, next CPE w/ PCP is 09/20/21 -Recommended to continue current medication;  Diabetes (A1c goal <7%) -Improving - recent A1c at goal; he has follows with endocrinologist in [place];  -Pt is also interested in having Ozempic pt assistance come through PCP office instead of [facility] endocrine -Current home glucose readings (via [place]) Random: 11/18 @ 1:07pm - 129 Avg: 167; GMI 7.3% -Current medications: Humulin R U-500 via insulin pump Ozempic 2 mg weekly Dexcom G6 -Medications previously tried: Lovie Macadamia (yeast infections)  -Educated on A1c and blood sugar goals; -Coordinate with PCP for Ozempic pt assistance renewal for 2023 -Recommend to continue current medication  Heart Failure / HTN (Goal: BP < 130/80; prevent exacerbations) -Controlled - pt reports checking BP 2-3 times a week; he does have history of low BP in s/o dehydration/diarrhea; pt denies recent  swelling/SOB -Current home BP readings: 124/68, HR 90  -Last ejection fraction: 25%  -HF type: Systolic; NYHA Class: III (marked limitation of activity) -Current treatment: Furosemide 40 mg - 2 tab AM, 1 tab PM Metoprolol succinate 50 mg daily Entresto 97-103 mg - 1/2 tab BID Spironolocatone 50 mg mg daily - 1/2 tab daily -Educated on Benefits of medications for managing symptoms and prolonging life -Recommended to continue current medication  Depression/Anxiety (Goal: manage symptoms) -Controlled - pt reports mood is well controlled, he is sleeping well -PHQ9: 16 (02/2020) - moderate/severe depression -GAD7: 12 (07/2019) - moderate anxiety -Connected with PCP for mental health support -Current treatment: Escitalopram 20 mg daily Trazodone 100 mg - 2 tab HS -Medications previously tried/failed: duloxetine, sertraline, -Educated on Benefits of medication for symptom control -Recommended to continue current medication  Neuropathy (Goal: manage symptoms) -Controlled - pt reports gabapentin was started for leg pain; he does not endorse excess drowsiness/sedation -Current treatment  Gabapentin 600 mg - 2 tab BID -Recommended to continue current medication  Health Maintenance -Vaccine gaps: Covid booster, Shingrix -Pt reports he has side effects with first covid booster (chills); he reports he had both doses of Shingrix at [facility]  Patient Goals/Self-Care Activities Patient will:  - take medications as prescribed -focus on medication adherence by pill box -check glucose via Dexcom G6, document, and provide at future appointments -check blood pressure daily, document, and provide at future appointments -collaborate with provider on medication access solutions -target a minimum of 150 minutes of moderate intensity  exercise weekly -Call Endocrine office for insulin pump adjustments

## 2021-11-03 ENCOUNTER — Other Ambulatory Visit: Payer: Self-pay | Admitting: Primary Care

## 2021-11-03 DIAGNOSIS — E7800 Pure hypercholesterolemia, unspecified: Secondary | ICD-10-CM

## 2021-11-06 ENCOUNTER — Telehealth: Payer: Self-pay

## 2021-11-06 NOTE — Chronic Care Management (AMB) (Signed)
Chronic Care Management Pharmacy Assistant   Name: Brian Williams  MRN: [id number] DOB: 09-03-1961   Reason for Encounter: General Adherence   Recent office visits:  10/12/21-PCP-Katherine Clark,NP-Telemedicine-Patient presented for fatigue.Decrease dose of Lexapro to 10mg  daily-repeat labs next month.  Recent consult visits:  10/19/21-Cardiology- Zachary George, NP- Patient presented for follow up hypertension. Increase metoprolol to 75mg .(1 1/2 tablets) daily-weigh daily -encouraged exercise,labs reviewed. 10/10/21-Dermatology-Jayson Miedema,MD-cryotherapy- use broad spectrum sunscreen,no medication changes 10/06/21-[facility]- Kiran Anna,MD-patient presented for colonoscopy. 09/21/21-Orthopedic Surgery-James McGhee,PA- Patient presented for bilateral knee  Zilretta injections 09/15/21-Orthopedic Surgery-J.Lance McGhee,PA- right knee aspiration. 09/04/21-Orthopedic surgery-Lance McGhee,PA-Follow up osteoarthritis bilateral knees, Oxycodone refilled. 08/28/21-[facility] ED- Patient presented for right knee pain-Xrays- knee immobilizer given, short course oxycodone 5mg  take 1 tablet every 6 hours as needed for pain . Discharged to home  08/24/21-Cardiology-Patricia Chang,MD-Patient presented for hospital follow up.No medication changes 08/07/21-Orthopedic Surgery-Andrew Kubinski,DO- Patient presented for follow up left hip pain.Xrays reviewed,encouraged H2O aerobics,Use chronic medications, Tylenol, topical diclofenac/pain cream, relative rest, massage, and ice/heat as needed for pain   Hospital visits:  Medication Reconciliation was completed by comparing discharge summary, patients EMR and Pharmacy list, and upon discussion with patient.  Admitted to the hospital on 09/06/21 due to Sepsis. Discharge date was 09/09/21. Discharged from [facility].    New?Medications Started at [facility] Discharge:?? -started ciprofloxacin  500mg   take 1 tablet 2 times daily  -started lidocaine patches  5%  apply patch  every 12 hours  Medications Discontinued at Hospital Discharge: -Stopped Carvedilol   Medications that remain the same after Hospital Discharge:??  -All other medications will remain the same.    Medications: Outpatient Encounter Medications as of 11/06/2021  Medication Sig Note   aspirin EC 81 MG tablet Take 81 mg by mouth daily.     clopidogrel (PLAVIX) 75 MG tablet Take 1 tablet (75 mg total) by mouth daily.    colchicine 0.6 MG tablet Take 0.6 mg by mouth daily. (Patient not taking: Reported on 10/12/2021)    escitalopram (LEXAPRO) 20 MG tablet TAKE 1 TABLET BY MOUTH  DAILY FOR ANXIETY AND  DEPRESSION    ezetimibe (ZETIA) 10 MG tablet Take 1 tablet (10 mg total) by mouth daily. For cholesterol.    gabapentin (NEURONTIN) 600 MG tablet TAKE TWO TABLETS BY MOUTH TWICE A DAY FOR PAIN    gemfibrozil (LOPID) 600 MG tablet TAKE 1 TABLET( 600MG  TOTAL) BY MOUTH DAILY FOR CHOLESTEROL    insulin regular human CONCENTRATED (HUMULIN R) 500 UNIT/ML injection Inject 0-25 Units into the skin daily at 12 noon. 09/06/2021: Pt can't remember anything about the humulin R since he has been sick.    lidocaine (LIDODERM) 5 % Place 1 patch onto the skin daily. Remove & Discard patch within 12 hours or as directed by MD    methocarbamol (ROBAXIN) 750 MG tablet Take 1 tablet (750 mg total) by mouth every 8 (eight) hours as needed for muscle spasms. 09/06/2021: LF 09/01/2021 750 MG TABS (disp 90, 30d supply)    metoprolol succinate (TOPROL-XL) 50 MG 24 hr tablet Take 50 mg by mouth daily.    nitroGLYCERIN (NITROSTAT) 0.4 MG SL tablet Place 1 tablet (0.4 mg total) under the tongue every 5 (five) minutes as needed for chest pain.    omega-3 acid ethyl esters (LOVAZA) 1 g capsule Take 1 g by mouth 2 (two) times daily.    oxyCODONE (OXY IR/ROXICODONE) 5 MG immediate release tablet Take 1 tablet (5 mg total) by mouth every 6 (six)  hours as needed for severe pain. (Patient not taking: Reported on 10/12/2021)     rosuvastatin (CRESTOR) 20 MG tablet TAKE 1 TABLET BY MOUTH AT BEDTIME FOR CHOLESTEROL    sacubitril-valsartan (ENTRESTO) 49-51 MG Take 1 tablet by mouth 2 (two) times daily.    Semaglutide, 2 MG/DOSE, (OZEMPIC, 2 MG/DOSE,) 8 MG/3ML SOPN Inject 2 mg into the skin every Friday.    spironolactone (ALDACTONE) 50 MG tablet Take 25 mg by mouth daily.    traZODone (DESYREL) 100 MG tablet TAKE 2 TABLETS BY MOUTH  EVERY NIGHT AT BEDTIME for sleep.    No facility-administered encounter medications on file as of 11/06/2021.      Contacted Brian Williams on 11/06/21 for general disease state and medication adherence call.   Patient is not more than 5 days past due for refill on the following medications per chart history:  [facility] Medications: Medication Name/mg Last Fill Days Supply Rosuvastatin 20mg   08/18/21 90 Entresto 49-51mg   08/31/21 90 Gets from manufacturer Ozempic 2mg    Gets from manufacturer Humulin R 500mg   Gets from manufacturer   What concerns do you have about your medications?  All new PAP renewals complete, waiting on delivery   The patient denies side effects with their medications.   How often do you forget or accidentally miss a dose? Rarely    Do you use a pillbox? Yes  Are you having any problems getting your medications from your pharmacy? No The patient will use [facility] mail order and [facility] for short course medications  Has the cost of your medications been a concern? No  Since last visit with CPP, the following interventions have been made. Increase of Ozempic to 2mg . Inject weekly, stopped carvedilol in hospital,  The patient has had an ED visit since last contact. 10/06/21, 09/06/21,08/28/21,  The patient denies problems with their health.   Patient denies concerns or questions   Counseled patient on:  Benefits of adherence packaging or a pillbox and Access to CCM team for any cost, medication or pharmacy concerns.   Care Gaps: Annual wellness visit in  last year? Yes Most Recent BP reading:96/52  78-P  10/19/21    Patient reports BP  averages  120's/60's   If Diabetic: Most recent A1C reading:7.6%  09/07/21 Last eye exam / retinopathy screening:05/12/21 Last diabetic foot exam:2021  Upcoming appointments: PCP appointment on 11/14/21   Marjo Bicker CPP notified  [name], [place] [phone]  Total time spent for month CPA: 30 min.

## 2021-11-07 ENCOUNTER — Ambulatory Visit (INDEPENDENT_AMBULATORY_CARE_PROVIDER_SITE_OTHER): Payer: Medicare Other

## 2021-11-07 DIAGNOSIS — I255 Ischemic cardiomyopathy: Secondary | ICD-10-CM | POA: Diagnosis not present

## 2021-11-07 LAB — CUP PACEART REMOTE DEVICE CHECK
Battery Remaining Longevity: 38 mo
Battery Remaining Percentage: 42 %
Battery Voltage: 2.89 V
Brady Statistic AP VP Percent: 1 %
Brady Statistic AP VS Percent: 1.9 %
Brady Statistic AS VP Percent: 28 %
Brady Statistic AS VS Percent: 68 %
Brady Statistic RA Percent Paced: 1 %
Brady Statistic RV Percent Paced: 29 %
Date Time Interrogation Session: 20230221033708
HighPow Impedance: 42 Ohm
HighPow Impedance: 43 Ohm
Implantable Lead Implant Date: 20160929
Implantable Lead Implant Date: 20160929
Implantable Lead Location: 753859
Implantable Lead Location: 753860
Implantable Lead Model: 7071
Implantable Pulse Generator Implant Date: 20160929
Lead Channel Impedance Value: 350 Ohm
Lead Channel Impedance Value: 440 Ohm
Lead Channel Pacing Threshold Amplitude: 0.75 V
Lead Channel Pacing Threshold Amplitude: 0.75 V
Lead Channel Pacing Threshold Pulse Width: 0.4 ms
Lead Channel Pacing Threshold Pulse Width: 0.5 ms
Lead Channel Sensing Intrinsic Amplitude: 11.7 mV
Lead Channel Sensing Intrinsic Amplitude: 4.4 mV
Lead Channel Setting Pacing Amplitude: 2 V
Lead Channel Setting Pacing Amplitude: 2.5 V
Lead Channel Setting Pacing Pulse Width: 0.5 ms
Lead Channel Setting Sensing Sensitivity: 0.5 mV
Pulse Gen Serial Number: 7306375

## 2021-11-08 ENCOUNTER — Other Ambulatory Visit: Payer: Medicare Other

## 2021-11-14 ENCOUNTER — Encounter: Payer: Medicare Other | Admitting: Primary Care

## 2021-11-14 NOTE — Progress Notes (Signed)
Remote ICD transmission.   

## 2021-11-16 DIAGNOSIS — E1142 Type 2 diabetes mellitus with diabetic polyneuropathy: Secondary | ICD-10-CM | POA: Diagnosis not present

## 2021-11-16 DIAGNOSIS — Z95810 Presence of automatic (implantable) cardiac defibrillator: Secondary | ICD-10-CM | POA: Diagnosis not present

## 2021-11-16 DIAGNOSIS — Z794 Long term (current) use of insulin: Secondary | ICD-10-CM | POA: Diagnosis not present

## 2021-11-16 DIAGNOSIS — I25118 Atherosclerotic heart disease of native coronary artery with other forms of angina pectoris: Secondary | ICD-10-CM | POA: Diagnosis not present

## 2021-11-16 DIAGNOSIS — G4733 Obstructive sleep apnea (adult) (pediatric): Secondary | ICD-10-CM | POA: Diagnosis not present

## 2021-11-16 DIAGNOSIS — E118 Type 2 diabetes mellitus with unspecified complications: Secondary | ICD-10-CM | POA: Diagnosis not present

## 2021-11-16 DIAGNOSIS — Z720 Tobacco use: Secondary | ICD-10-CM | POA: Diagnosis not present

## 2021-11-16 DIAGNOSIS — I10 Essential (primary) hypertension: Secondary | ICD-10-CM | POA: Diagnosis not present

## 2021-11-16 DIAGNOSIS — G4730 Sleep apnea, unspecified: Secondary | ICD-10-CM | POA: Diagnosis not present

## 2021-11-16 DIAGNOSIS — E785 Hyperlipidemia, unspecified: Secondary | ICD-10-CM | POA: Diagnosis not present

## 2021-11-16 DIAGNOSIS — I5022 Chronic systolic (congestive) heart failure: Secondary | ICD-10-CM | POA: Diagnosis not present

## 2021-11-16 DIAGNOSIS — E781 Pure hyperglyceridemia: Secondary | ICD-10-CM | POA: Diagnosis not present

## 2021-11-17 ENCOUNTER — Other Ambulatory Visit: Payer: Self-pay

## 2021-11-17 DIAGNOSIS — E785 Hyperlipidemia, unspecified: Secondary | ICD-10-CM

## 2021-11-17 MED ORDER — GEMFIBROZIL 600 MG PO TABS: ORAL_TABLET | ORAL | 0 refills | Status: DC

## 2021-11-17 NOTE — Telephone Encounter (Signed)
Pharmacy called requesting refill on Lopid  ?

## 2021-11-24 ENCOUNTER — Other Ambulatory Visit: Payer: Self-pay | Admitting: Primary Care

## 2021-11-24 DIAGNOSIS — M545 Low back pain, unspecified: Secondary | ICD-10-CM

## 2021-11-24 DIAGNOSIS — G8929 Other chronic pain: Secondary | ICD-10-CM

## 2021-11-28 ENCOUNTER — Encounter: Payer: Self-pay | Admitting: Student in an Organized Health Care Education/Training Program

## 2021-12-13 ENCOUNTER — Encounter: Payer: Self-pay | Admitting: Primary Care

## 2021-12-13 ENCOUNTER — Other Ambulatory Visit: Payer: Self-pay

## 2021-12-13 ENCOUNTER — Ambulatory Visit
Payer: Medicare Other | Attending: Student in an Organized Health Care Education/Training Program | Admitting: Student in an Organized Health Care Education/Training Program

## 2021-12-13 ENCOUNTER — Ambulatory Visit (INDEPENDENT_AMBULATORY_CARE_PROVIDER_SITE_OTHER): Payer: Medicare Other | Admitting: Primary Care

## 2021-12-13 VITALS — BP 90/50 | HR 75 | Temp 98.1°F | Ht 72.0 in | Wt 267.4 lb

## 2021-12-13 DIAGNOSIS — I959 Hypotension, unspecified: Secondary | ICD-10-CM

## 2021-12-13 DIAGNOSIS — G8929 Other chronic pain: Secondary | ICD-10-CM

## 2021-12-13 DIAGNOSIS — E118 Type 2 diabetes mellitus with unspecified complications: Secondary | ICD-10-CM

## 2021-12-13 DIAGNOSIS — M25551 Pain in right hip: Secondary | ICD-10-CM

## 2021-12-13 DIAGNOSIS — F411 Generalized anxiety disorder: Secondary | ICD-10-CM

## 2021-12-13 DIAGNOSIS — M51369 Other intervertebral disc degeneration, lumbar region without mention of lumbar back pain or lower extremity pain: Secondary | ICD-10-CM

## 2021-12-13 DIAGNOSIS — E1142 Type 2 diabetes mellitus with diabetic polyneuropathy: Secondary | ICD-10-CM

## 2021-12-13 DIAGNOSIS — Z0000 Encounter for general adult medical examination without abnormal findings: Secondary | ICD-10-CM | POA: Diagnosis not present

## 2021-12-13 DIAGNOSIS — M533 Sacrococcygeal disorders, not elsewhere classified: Secondary | ICD-10-CM

## 2021-12-13 DIAGNOSIS — M47816 Spondylosis without myelopathy or radiculopathy, lumbar region: Secondary | ICD-10-CM | POA: Diagnosis not present

## 2021-12-13 DIAGNOSIS — Z9989 Dependence on other enabling machines and devices: Secondary | ICD-10-CM

## 2021-12-13 DIAGNOSIS — D649 Anemia, unspecified: Secondary | ICD-10-CM

## 2021-12-13 DIAGNOSIS — I5022 Chronic systolic (congestive) heart failure: Secondary | ICD-10-CM

## 2021-12-13 DIAGNOSIS — M961 Postlaminectomy syndrome, not elsewhere classified: Secondary | ICD-10-CM

## 2021-12-13 DIAGNOSIS — I420 Dilated cardiomyopathy: Secondary | ICD-10-CM

## 2021-12-13 DIAGNOSIS — Z125 Encounter for screening for malignant neoplasm of prostate: Secondary | ICD-10-CM

## 2021-12-13 DIAGNOSIS — E785 Hyperlipidemia, unspecified: Secondary | ICD-10-CM

## 2021-12-13 DIAGNOSIS — M25552 Pain in left hip: Secondary | ICD-10-CM

## 2021-12-13 DIAGNOSIS — I10 Essential (primary) hypertension: Secondary | ICD-10-CM

## 2021-12-13 DIAGNOSIS — M545 Low back pain, unspecified: Secondary | ICD-10-CM

## 2021-12-13 DIAGNOSIS — G4733 Obstructive sleep apnea (adult) (pediatric): Secondary | ICD-10-CM | POA: Diagnosis not present

## 2021-12-13 DIAGNOSIS — M5136 Other intervertebral disc degeneration, lumbar region: Secondary | ICD-10-CM

## 2021-12-13 DIAGNOSIS — G894 Chronic pain syndrome: Secondary | ICD-10-CM

## 2021-12-13 DIAGNOSIS — F330 Major depressive disorder, recurrent, mild: Secondary | ICD-10-CM

## 2021-12-13 LAB — CBC
HCT: 35.5 % — ABNORMAL LOW (ref 39.0–52.0)
Hemoglobin: 12.2 g/dL — ABNORMAL LOW (ref 13.0–17.0)
MCHC: 34.3 g/dL (ref 30.0–36.0)
MCV: 94.9 fl (ref 78.0–100.0)
Platelets: 191 10*3/uL (ref 150.0–400.0)
RBC: 3.74 Mil/uL — ABNORMAL LOW (ref 4.22–5.81)
RDW: 14.3 % (ref 11.5–15.5)
WBC: 8.8 10*3/uL (ref 4.0–10.5)

## 2021-12-13 LAB — PSA, MEDICARE: PSA: 0.1 ng/ml (ref 0.10–4.00)

## 2021-12-13 LAB — VITAMIN B12: Vitamin B-12: 561 pg/mL (ref 211–911)

## 2021-12-13 NOTE — Progress Notes (Signed)
? ?Subjective:  ? ? Patient ID: Brian Williams, male    DOB: 1960/11/17, 61 y.o.   MRN: [id number] ? ?HPI ? ?Brian Williams is a very pleasant 61 y.o. male with a significant medical history including CAD, CHF, hypertension, type 2 diabetes, OSA, congestive dilated cardiomyopathy with ICD in place, chronic back pain, diabetic neuropathy, anxiety depression who presents today for complete physical and follow up of chronic conditions. ? ?He follows with cardiology, pain management, and endocrinology. ? ?He continues to struggle with chronic bilateral foot pain with paresthesias.  He recently increased his gabapentin dose and is taking 1800 mg twice daily.  He does follow with pain management and has an appointment scheduled for later this afternoon. ? ?Immunizations: ?-Tetanus: 2022 ?-Influenza: Completed this season  ?-Covid-19: 4 vaccines  ?-Shingles: Completed Shingrix  ?-Pneumonia: Pneumovax in 2019 ? ? ?Diet: Fair diet.  ?Exercise: No regular exercise. ? ?Eye exam: Completes annually  ?Dental exam: Completes semi-annually  ? ?Colonoscopy: Completed in 2023, due 2030 ?PSA: Due ? ?Lung Cancer Screening: Not enrolled, but did complete CTA chest in December 2022.  Recently prescribed Chantix per cardiology, started one week ago, is cutting back on cigarette use.  ? ?He's smoked cigarettes since the ago of 21, 1 PPD.  ? ? ?BP Readings from Last 3 Encounters:  ?12/13/21 (!) 90/50  ?10/12/21 (!) 99/54  ?10/06/21 115/64  ? ? ? ? ?Review of Systems  ?Constitutional:  Negative for unexpected weight change.  ?HENT:  Negative for rhinorrhea.   ?Eyes:  Negative for visual disturbance.  ?Respiratory:  Negative for cough and shortness of breath.   ?Cardiovascular:  Negative for chest pain.  ?Gastrointestinal:  Negative for constipation and diarrhea.  ?Genitourinary:  Negative for difficulty urinating.  ?Musculoskeletal:  Positive for arthralgias and back pain.  ?Skin:  Negative for rash.  ?Allergic/Immunologic: Negative for  environmental allergies.  ?Neurological:  Positive for numbness. Negative for dizziness and headaches.  ?Psychiatric/Behavioral:  The patient is not nervous/anxious.   ? ?   ? ? ?Past Medical History:  ?Diagnosis Date  ? AICD (automatic cardioverter/defibrillator) present   ? Allergy   ? Anxiety   ? takes Xanax as needed  ? Appendicitis   ? Arthritis   ? back,knees,right shoulder  ? Back pain   ? Cardiomyopathy ([place])   ? CHF (congestive heart failure) ([place])   ? takes Lasix and Aldactone daily  ? Coronary artery disease   ? takes Plavix daily  ? Depression   ? takes Zoloft daily  ? Diabetes mellitus without complication ([place])   ? Humulin R and Farxiga daily.Fasting blood sugar runs 140  ? GERD (gastroesophageal reflux disease)   ? Headache   ? History of bronchitis   ? History of colon polyps   ? benign  ? History of hiatal hernia   ? Hyperlipidemia   ? takes Ecologist, and Zetia daily  ? Hypertension   ? takes [place] and Coreg daily  ? MI (myocardial infarction) ([place]) 2001  ? Obesity   ? Peripheral neuropathy   ? Pneumonia   ? history of-last time about 14 yrs ago  ? PONV (postoperative nausea and vomiting)   ? after knee surgery 25 yrs ago b/p stayed elevated for a while  ? Presence of permanent cardiac pacemaker   ? Shortness of breath dyspnea   ? with exertion  ? Sleep apnea   ? uses CPAP nightly  ? Ventricular tachycardia   ? s/p RFCA PVCs 2013  ? ? ?  Social History  ? ?Socioeconomic History  ? Marital status: Married  ?  Spouse name: Taris Galindo  ? Number of children: 2  ? Years of education: Not on file  ? Highest education level: Not on file  ?Occupational History  ? Not on file  ?Tobacco Use  ? Smoking status: Former  ?  Packs/day: 1.00  ?  Years: 25.00  ?  Pack years: 25.00  ?  Types: Cigarettes  ?  Quit date: 05/19/2018  ?  Years since quitting: 3.5  ? Smokeless tobacco: Never  ?Vaping Use  ? Vaping Use: Never used  ?Substance and Sexual Activity  ? Alcohol use: No  ?  Comment: rare  ? Drug  use: No  ? Sexual activity: Not Currently  ?Other Topics Concern  ? Not on file  ?Social History Narrative  ? Married.  ? Moved from [place].  ? Disabled.  ? ?Social Determinants of Health  ? ?Financial Resource Strain: Low Risk   ? Difficulty of Paying Living Expenses: Not hard at all  ?Food Insecurity: No Food Insecurity  ? Worried About Charity fundraiser in the Last Year: Never true  ? Ran Out of Food in the Last Year: Never true  ?Transportation Needs: No Transportation Needs  ? Lack of Transportation (Medical): No  ? Lack of Transportation (Non-Medical): No  ?Physical Activity: Inactive  ? Days of Exercise per Week: 0 days  ? Minutes of Exercise per Session: 0 min  ?Stress: No Stress Concern Present  ? Feeling of Stress : Not at all  ?Social Connections: Socially Isolated  ? Frequency of Communication with Friends and Family: Never  ? Frequency of Social Gatherings with Friends and Family: Never  ? Attends Religious Services: Never  ? Active Member of Clubs or Organizations: No  ? Attends Archivist Meetings: Never  ? Marital Status: Married  ?Intimate Partner Violence: Not At Risk  ? Fear of Current or Ex-Partner: No  ? Emotionally Abused: No  ? Physically Abused: No  ? Sexually Abused: No  ? ? ?Past Surgical History:  ?Procedure Laterality Date  ? APPENDECTOMY    ? BACK SURGERY    ? CARDIAC CATHETERIZATION    ? CARDIAC CATHETERIZATION Left 05/10/2016  ? Procedure: Left Heart Cath and Coronary Angiography;  Surgeon: Minna Merritts, MD;  Location: [facility];  Service: Cardiovascular;  Laterality: Left;  ? CARDIAC DEFIBRILLATOR PLACEMENT  10/16/2007  ? ICD Model number [id number] serial number [id number]  ? CARDIAC ELECTROPHYSIOLOGY STUDY AND ABLATION    ? CHOLECYSTECTOMY  2001  ? COLONOSCOPY    ? COLONOSCOPY WITH PROPOFOL N/A 08/28/2018  ? Procedure: COLONOSCOPY WITH PROPOFOL;  Surgeon: Jonathon Bellows, MD;  Location: [facility] ENDOSCOPY;  Service: Gastroenterology;  Laterality: N/A;  ? COLONOSCOPY  WITH PROPOFOL N/A 10/06/2021  ? Procedure: COLONOSCOPY WITH PROPOFOL;  Surgeon: Jonathon Bellows, MD;  Location[facility] ENDOSCOPY;  Service: Gastroenterology;  Laterality: N/A;  ? CORONARY ANGIOPLASTY WITH STENT PLACEMENT    ? 7 stents  ? CORONARY ARTERY BYPASS GRAFT N/A 05/24/2016  ? Procedure: CORONARY ARTERY BYPASS GRAFTING (CABG) x four , using left internal mammary artery and left leg greater saphenous vein harvested endoscopically;  Surgeon: Gaye Pollack, MD;  Location: [facility] OR;  Service: Open Heart Surgery;  Laterality: N/A;  ? EP IMPLANTABLE DEVICE N/A 06/16/2015  ? Procedure: ICD Generator Changeout;  Surgeon: Deboraha Sprang, MD;  Location: [facility];  Service: Cardiovascular;  Laterality: N/A;  ?  ESOPHAGOGASTRODUODENOSCOPY    ? INSERT / REPLACE / REMOVE PACEMAKER    ? KNEE SURGERY    ? bilateral knee   ? [name] WITHOUT CARDIOVERSION N/A 05/24/2016  ? Procedure: TRANSESOPHAGEAL ECHOCARDIOGRAM ([name]);  Surgeon: Gaye Pollack, MD;  Location: [facility];  Service: Open Heart Surgery;  Laterality: N/A;  ? VASECTOMY    ? ? ?Family History  ?Problem Relation Age of Onset  ? Heart attack Mother 63  ? Hypertension Mother   ? Melanoma Mother   ? Heart attack Father 86  ? Hypertension Father   ? Hypertension Maternal Uncle   ? Heart disease Maternal Uncle   ? Heart disease Maternal Grandmother   ? Stroke Maternal Grandmother   ? Diabetes Neg Hx   ? ? ?Allergies  ?Allergen Reactions  ? Naproxen Other (See Comments), Anxiety and Rash  ?  Causes hyperactivity ?causes hyperactivity  ? Morphine And Related Nausea And Vomiting  ?  Morphine only  ? ? ?Current Outpatient Medications on File Prior to Visit  ?Medication Sig Dispense Refill  ? aspirin EC 81 MG tablet Take 81 mg by mouth daily.  90 tablet   ? clopidogrel (PLAVIX) 75 MG tablet Take 1 tablet (75 mg total) by mouth daily. 90 tablet 0  ? escitalopram (LEXAPRO) 20 MG tablet TAKE 1 TABLET BY MOUTH  DAILY FOR ANXIETY AND  DEPRESSION    ? ezetimibe (ZETIA) 10 MG tablet Take 1  tablet (10 mg total) by mouth daily. For cholesterol. 90 tablet 0  ? gabapentin (NEURONTIN) 600 MG tablet TAKE TWO TABLETS BY MOUTH TWICE A DAY FOR PAIN 360 tablet 0  ? gemfibrozil (LOPID) 600 MG tablet TA

## 2021-12-13 NOTE — Assessment & Plan Note (Addendum)
Controlled per patient. ? ?Continue Lexapro 20 mg daily. ?Continue trazodone 200 mg at bedtime ?

## 2021-12-13 NOTE — Progress Notes (Signed)
Patient: Brian Williams  Service Category: E/M  Provider: Gillis Santa, MD  ?DOB: [birth year]  DOS: 12/13/2021  Location: Office  ?MRN: [id number]  Setting: Ambulatory outpatient  Referring Provider: Pleas Koch, NP  ?Type: Established Patient  Specialty: Interventional Pain Management  PCP: Pleas Koch, NP  ?Location: Remote location  Delivery: TeleHealth    ? ?Virtual Encounter - Pain Management ?PROVIDER NOTE: Information contained herein reflects review and annotations entered in association with encounter. Interpretation of such information and data should be left to medically-trained personnel. Information provided to patient can be located elsewhere in the medical record under "Patient Instructions". Document created using STT-dictation technology, any transcriptional errors that may result from process are unintentional.  ?  ?Contact & Pharmacy ?Preferred: [phone] ?Home: [phone] (home) ?Mobile: [phone] (mobile) ?E-mail: [email or web address]  ?[facility] [address] [place] - [place], [place] [address] AT [facility] [address] ?[address] [place] [address] ?Phone: [phone] Fax: [phone] ?  ?Pre-screening  ?Mr. Brands offered "in-person" vs "virtual" encounter. He indicated preferring virtual for this encounter.  ? ?Reason ?COVID-19*  Social distancing based on CDC and AMA recommendations.  ? ?I contacted Brian Williams on 12/13/2021 via telephone.      I clearly identified myself as Gillis Santa, MD. I verified that I was speaking with the correct person using two identifiers (Name: Brian Williams, and date of birth: Brian Williams). ? ?Consent ?I sought verbal advanced consent from Brian Williams for virtual visit interactions. I informed Brian Williams of possible security and privacy concerns, risks, and limitations associated with providing "not-in-person" medical evaluation and management services. I also informed Brian Williams of the availability of "in-person"  appointments. Finally, I informed him that there would be a charge for the virtual visit and that he could be  personally, fully or partially, financially responsible for it. Mr. Ager expressed understanding and agreed to proceed.  ? ?Historic Elements   ?Brian Williams is a 61 y.o. year old, male patient evaluated today after our last contact on 08/25/2021. Brian Williams  has a past medical history of Acute appendicitis (04/03/2021), Acute pyelonephritis (09/07/2021), AICD (automatic cardioverter/defibrillator) present, Allergy, Anxiety, Appendicitis, Arthritis, Back pain, Bacteremia due to Klebsiella pneumoniae (09/07/2021), Cardiomyopathy ([place]), CHF (congestive heart failure) ([place]), Coronary artery disease, Depression, Diabetes mellitus without complication ([place]), GERD (gastroesophageal reflux disease), Headache, History of bronchitis, History of colon polyps, History of hiatal hernia, Hyperlipidemia, Hypertension, MI (myocardial infarction) ([place]) (2001), Multifocal pneumonia (09/07/2021), Obesity, Peripheral neuropathy, Pneumonia, PONV (postoperative nausea and vomiting), Presence of permanent cardiac pacemaker, Sepsis ([place]) (09/06/2021), Shortness of breath dyspnea, Sleep apnea, and Ventricular tachycardia. He also  has a past surgical history that includes Cardiac electrophysiology study and ablation; Cardiac catheterization; Knee surgery; Back surgery; Cardiac defibrillator placement (10/16/2007); Insert / replace / remove pacemaker; Cardiac catheterization (N/A, 06/16/2015); Cholecystectomy (2001); Cardiac catheterization (Left, 05/10/2016); Coronary angioplasty with stent; Colonoscopy; Esophagogastroduodenoscopy; Vasectomy; Coronary artery bypass graft (N/A, 05/24/2016); [name] without cardioversion (N/A, 05/24/2016); Colonoscopy with propofol (N/A, 08/28/2018); Appendectomy; and Colonoscopy with propofol (N/A, 10/06/2021). Brian Williams has a current medication list which includes the following prescription(s):  aspirin ec, clopidogrel, escitalopram, ezetimibe, furosemide, gabapentin, gemfibrozil, humulin r, methocarbamol, metoprolol succinate, nitroglycerin, omega-3 acid ethyl esters, rosuvastatin, entresto, ozempic (2 mg/dose), spironolactone, trazodone, varenicline tartrate (starter), and oxycodone. He  reports that he quit smoking about 3 years ago. His smoking use included cigarettes. He has a 25.00 pack-year smoking history. He has never used smokeless tobacco. He reports that  he does not drink alcohol and does not use drugs. Brian Williams is allergic to naproxen and morphine and related.  ? ?HPI  ?Today, he is being contacted for worsening of previously known (established) problem ? ?Brian Williams is complaining of burning and tingling in bilateral feet which is worse at night.  He is on maximum dose gabapentin at 1800 mg twice daily which he states is not helping him out.  He has tried Lyrica in the past at 75 mg 3 times a day with limited response. ?He has also tried alpha lipoic acid with no benefit.  Today I offer the patient Qutenza treatment for painful diabetic neuropathy of bilateral feet.  Risk and benefits reviewed and patient would like to proceed with that. ? ?Future considerations include retrial of Lyrica at a higher dose or trial of Cymbalta.  Patient is on trazodone and SSRI so will need discussion with Brian Williams, PCP, given safety risk of adding Cymbalta in the context of trazodone and SSRI.  Patient is okay with holding off on any medications at this time and trying Qutenza first. ? ? ? ?Laboratory Chemistry Profile  ? ?Renal ?Lab Results  ?Component Value Date  ? BUN 23 (H) 09/09/2021  ? CREATININE 0.70 09/09/2021  ? BCR 23 (H) 04/05/2020  ? GFR 97.36 07/13/2021  ? GFRAA 111 04/05/2020  ? GFRNONAA >60 09/09/2021  ?  Hepatic ?Lab Results  ?Component Value Date  ? AST 25 09/06/2021  ? ALT 21 09/06/2021  ? ALBUMIN 3.6 09/06/2021  ? ALKPHOS 45 09/06/2021  ? HCVAB NEGATIVE 10/06/2015  ? LIPASE 25 04/03/2021   ?  ?Electrolytes ?Lab Results  ?Component Value Date  ? NA 130 (L) 09/09/2021  ? K 3.6 09/09/2021  ? CL 102 09/09/2021  ? CALCIUM 8.4 (L) 09/09/2021  ? MG 2.3 09/09/2021  ? PHOS 2.5 09/08/2021  ?  Bone ?No results found for: [place], [id number], [id number], ZO1096EA5, 25OHVITD1, 25OHVITD2, 25OHVITD3, TESTOFREE, TESTOSTERONE  ?Inflammation (CRP: Acute Phase) (ESR: Chronic Phase) ?Lab Results  ?Component Value Date  ? CRP 1.1 (H) 12/07/2020  ? ESRSEDRATE 106 (H) 09/06/2021  ? LATICACIDVEN 1.6 09/06/2021  ?    ?  ? ?Note: Above Lab results reviewed. ? ?Imaging  ?CUP PACEART REMOTE DEVICE CHECK ?Scheduled remote reviewed. Normal device function.   ? ?Next remote 91 days- JJB ? ?Assessment  ?The primary encounter diagnosis was Lumbar post-laminectomy syndrome. Diagnoses of Bilateral hip pain, Lumbar facet arthropathy, Chronic SI joint pain, Lumbar spondylosis, Lumbar degenerative disc disease, Diabetic polyneuropathy associated with type 2 diabetes mellitus ([place]), and Chronic pain syndrome were also pertinent to this visit. ? ?Plan of Care  ?Recommend Qutenza neurolysis for painful diabetic neuropathy that has been refractory to physical therapy, various membrane stabilizers including maximum dose gabapentin, prior trial of Lyrica as well as alpha lipoic acid.  Patient's A1c is 7.6, up from 6.59 months ago. ? ?Orders:  ?Orders Placed This Encounter  ?Procedures  ? NEUROLYSIS  ?  Please order Qutenza patches from pharmacy  ?  Standing Status:   Future  ?  Standing Expiration Date:   03/15/2022  ?  Order Specific Question:   Where will this procedure be performed?  ?  Answer:   [facility]  ? ?Follow-up plan:   ?Return in about 1 week (around 12/20/2021) for Qutenza , in clinic NS.   ? ?Recent Visits ?No visits were found meeting these conditions. ?Showing recent visits within past 90 days and meeting all other requirements ?Today's  Visits ?Date Type Provider Dept  ?12/13/21 Office Visit Gillis Santa, MD [facility]  ?Showing today's visits and meeting all other requirements ?Future Appointments ?No visits were found meeting these conditions. ?Showing future appointments within next 90 days and meeting all other re

## 2021-12-13 NOTE — Assessment & Plan Note (Signed)
Compliant to CPAP machine. ? ?Previously following with pulmonology. No recent visit.  ?

## 2021-12-13 NOTE — Assessment & Plan Note (Signed)
With ICD in place. ? ?Following with cardiology, office notes from March 2023 reviewed from care everywhere. ?

## 2021-12-13 NOTE — Assessment & Plan Note (Signed)
Continue Zetia 10 mg daily, gemfibrozil 600 mg daily, rosuvastatin 20 mg daily ? ?Lipid panel from February 2023 reviewed in care everywhere. ?

## 2021-12-13 NOTE — Assessment & Plan Note (Signed)
Appears euvolemic. ?Following with cardiology, office visit notes reviewed from March 2023 through care everywhere.  ? ?Continue furosemide 80 mg daily, Entresto 49-51 mg twice daily, spironolactone 12.5 mg twice daily. ? ? ? ? ?

## 2021-12-13 NOTE — Patient Instructions (Signed)
Stop by the lab prior to leaving today. I will notify you of your results once received.  ? ?Please send me a list of your medications.  ? ?Check out the cost for lung cancer screening.  ? ?It was a pleasure to see you today! ? ?Preventive Care 8-61 Years Old, Male ?Preventive care refers to lifestyle choices and visits with your health care provider that can promote health and wellness. Preventive care visits are also called wellness exams. ?What can I expect for my preventive care visit? ?Counseling ?During your preventive care visit, your health care provider may ask about your: ?Medical history, including: ?Past medical problems. ?Family medical history. ?Current health, including: ?Emotional well-being. ?Home life and relationship well-being. ?Sexual activity. ?Lifestyle, including: ?Alcohol, nicotine or tobacco, and drug use. ?Access to firearms. ?Diet, exercise, and sleep habits. ?Safety issues such as seatbelt and bike helmet use. ?Sunscreen use. ?Work and work Statistician. ?Physical exam ?Your health care provider will check your: ?Height and weight. These may be used to calculate your BMI (body mass index). BMI is a measurement that tells if you are at a healthy weight. ?Waist circumference. This measures the distance around your waistline. This measurement also tells if you are at a healthy weight and may help predict your risk of certain diseases, such as type 2 diabetes and high blood pressure. ?Heart rate and blood pressure. ?Body temperature. ?Skin for abnormal spots. ?What immunizations do I need? ?Vaccines are usually given at various ages, according to a schedule. Your health care provider will recommend vaccines for you based on your age, medical history, and lifestyle or other factors, such as travel or where you work. ?What tests do I need? ?Screening ?Your health care provider may recommend screening tests for certain conditions. This may include: ?Lipid and cholesterol levels. ?Diabetes  screening. This is done by checking your blood sugar (glucose) after you have not eaten for a while (fasting). ?Hepatitis B test. ?Hepatitis C test. ?HIV (human immunodeficiency virus) test. ?STI (sexually transmitted infection) testing, if you are at risk. ?Lung cancer screening. ?Prostate cancer screening. ?Colorectal cancer screening. ?Talk with your health care provider about your test results, treatment options, and if necessary, the need for more tests. ?Follow these instructions at home: ?Eating and drinking ? ?Eat a diet that includes fresh fruits and vegetables, whole grains, lean protein, and low-fat dairy products. ?Take vitamin and mineral supplements as recommended by your health care provider. ?Do not drink alcohol if your health care provider tells you not to drink. ?If you drink alcohol: ?Limit how much you have to 0-2 drinks a day. ?Know how much alcohol is in your drink. In the [place] one drink equals one 12 oz bottle of beer (355 mL), one 5 oz glass of wine (148 mL), or one 1? oz glass of hard liquor (44 mL). ?Lifestyle ?Brush your teeth every morning and night with fluoride toothpaste. Floss one time each day. ?Exercise for at least 30 minutes 5 or more days each week. ?Do not use any products that contain nicotine or tobacco. These products include cigarettes, chewing tobacco, and vaping devices, such as e-cigarettes. If you need help quitting, ask your health care provider. ?Do not use drugs. ?If you are sexually active, practice safe sex. Use a condom or other form of protection to prevent STIs. ?Take aspirin only as told by your health care provider. Make sure that you understand how much to take and what form to take. Work with your health care provider to  find out whether it is safe and beneficial for you to take aspirin daily. ?Find healthy ways to manage stress, such as: ?Meditation, yoga, or listening to music. ?Journaling. ?Talking to a trusted person. ?Spending time with friends and  family. ?Minimize exposure to UV radiation to reduce your risk of skin cancer. ?Safety ?Always wear your seat belt while driving or riding in a vehicle. ?Do not drive: ?If you have been drinking alcohol. Do not ride with someone who has been drinking. ?When you are tired or distracted. ?While texting. ?If you have been using any mind-altering substances or drugs. ?Wear a helmet and other protective equipment during sports activities. ?If you have firearms in your house, make sure you follow all gun safety procedures. ?What's next? ?Go to your health care provider once a year for an annual wellness visit. ?Ask your health care provider how often you should have your eyes and teeth checked. ?Stay up to date on all vaccines. ?This information is not intended to replace advice given to you by your health care provider. Make sure you discuss any questions you have with your health care provider. ?Document Revised: 03/01/2021 Document Reviewed: 03/01/2021 ?Elsevier Patient Education ? [place]. ? ?

## 2021-12-13 NOTE — Assessment & Plan Note (Signed)
Repeat CBC and B12 level pending. ?

## 2021-12-13 NOTE — Assessment & Plan Note (Signed)
Controlled per patient. ? ?He never reduced Lexapro to 10 mg daily, has been taking 20 mg daily. ? ?Continue Lexapro 10 mg daily. ?

## 2021-12-13 NOTE — Assessment & Plan Note (Signed)
Following with pain management. ? ?We will defer further gabapentin refills/neuropathy treatment to pain management. ? ?Continue methocarbamol 750 mg as needed. ?

## 2021-12-13 NOTE — Assessment & Plan Note (Signed)
Immunizations up-to-date. ?PSA due and pending. ?Colonoscopy up-to-date, due 2030. ?Recommended lung cancer screening, he will check on cost and update if he is willing to proceed. ? ?Encouraged healthy diet regular exercise. ?Commended him on cutting back with cigarettes. ? ?Exam today stable, labs pending and also reviewed from care everywhere and through epic. ?

## 2021-12-13 NOTE — Assessment & Plan Note (Addendum)
Noted today, patient asymptomatic. ? ?There seems to be a discrepancy somewhere in his medications, he does not know the directions or doses, does not have medications with him today. ? ?He will send a message via [place] later regarding his medication regimen. ? ?Reviewed office notes from care everywhere per cardiology from recent office visit ? ? ? ? ?

## 2021-12-13 NOTE — Assessment & Plan Note (Signed)
Over treated.  ? ?Suspect that he is taking too much of one of his medications as he does not recall doses and instructions of his medications today. ? ?Reviewed cardiology notes from March 2023 which indicate a dose increase of metoprolol succinate to 100 mg and to continue spironolactone 12.5 mg twice daily. ? ?He will send notification of the medication and doses he is taking once he gets home. ?

## 2021-12-13 NOTE — Assessment & Plan Note (Signed)
Strongly advised he stop increasing the doses of his medications without consulting with me first ? ?I will defer further treatment of his neuropathy to his pain management doctor as gabapentin has become ineffective despite high doses. ? ? ?

## 2021-12-13 NOTE — Assessment & Plan Note (Signed)
Reviewed A1c from January 2023. ?Overall diabetes appears to be controlled. ? ?Following with endocrinology. ? ?Continue Ozempic 2 mg weekly, Humulin insulin injections per endocrinology.  ?

## 2021-12-27 ENCOUNTER — Ambulatory Visit
Payer: Medicare Other | Attending: Student in an Organized Health Care Education/Training Program | Admitting: Student in an Organized Health Care Education/Training Program

## 2021-12-27 ENCOUNTER — Encounter: Payer: Self-pay | Admitting: Student in an Organized Health Care Education/Training Program

## 2021-12-27 VITALS — BP 127/77 | HR 108 | Temp 97.0°F | Resp 18 | Ht 72.0 in | Wt 257.0 lb

## 2021-12-27 DIAGNOSIS — E1142 Type 2 diabetes mellitus with diabetic polyneuropathy: Secondary | ICD-10-CM

## 2021-12-27 DIAGNOSIS — G894 Chronic pain syndrome: Secondary | ICD-10-CM | POA: Diagnosis not present

## 2021-12-27 MED ORDER — CAPSAICIN-CLEANSING GEL 8 % EX KIT
4.0000 | PACK | Freq: Once | CUTANEOUS | Status: DC
  Filled 2021-12-27: qty 4

## 2021-12-27 NOTE — Progress Notes (Signed)
Safety precautions to be maintained throughout the outpatient stay will include: orient to surroundings, keep bed in low position, maintain call bell within reach at all times, provide assistance with transfer out of bed and ambulation.  

## 2021-12-27 NOTE — Progress Notes (Signed)
PROVIDER NOTE: Interpretation of information contained herein should be left to medically-trained personnel. Specific patient instructions are provided elsewhere under "Patient Instructions" section of medical record. This document was created in part using STT-dictation technology, any transcriptional errors that may result from this process are unintentional.  ?Patient: Brian Williams ?Type: Established ?DOB: 02/14/1961 ?MRN: [id number] ?PCP: Pleas Koch, NP  Service: Procedure ?DOS: 12/27/2021 ?Setting: Ambulatory ?Location: Ambulatory outpatient facility ?Delivery: Face-to-face Provider: Gillis Santa, MD ?Specialty: Interventional Pain Management ?Specialty designation: 09 ?Location: Outpatient facility ?Ref. Prov.: Pleas Koch, NP   ? ?Primary Reason for Visit: Interventional Pain Management Treatment. ?CC: Foot Pain (bilateral) ? ?  ?Procedure:           ?Qutenza for painful diabetic polyneuropathy   ? ?1. Diabetic polyneuropathy associated with type 2 diabetes mellitus ([place])   ?2. Chronic pain syndrome   ? ?NAS-11 Pain score:  ? Pre-procedure: 6 /10  ? Post-procedure: 6 /10  ? ?  ?Pre-op H&P Assessment:  ?Brian Williams is a 61 y.o. (year old), male patient, seen today for interventional treatment. He  has a past surgical history that includes Cardiac electrophysiology study and ablation; Cardiac catheterization; Knee surgery; Back surgery; Cardiac defibrillator placement (10/16/2007); Insert / replace / remove pacemaker; Cardiac catheterization (N/A, 06/16/2015); Cholecystectomy (2001); Cardiac catheterization (Left, 05/10/2016); Coronary angioplasty with stent; Colonoscopy; Esophagogastroduodenoscopy; Vasectomy; Coronary artery bypass graft (N/A, 05/24/2016); [name] without cardioversion (N/A, 05/24/2016); Colonoscopy with propofol (N/A, 08/28/2018); Appendectomy; and Colonoscopy with propofol (N/A, 10/06/2021). Brian Williams has a current medication list which includes the following prescription(s):  aspirin ec, clopidogrel, escitalopram, ezetimibe, furosemide, gabapentin, gemfibrozil, humulin r, methocarbamol, metoprolol succinate, nitroglycerin, omega-3 acid ethyl esters, oxycodone, rosuvastatin, entresto, ozempic (2 mg/dose), spironolactone, trazodone, and varenicline tartrate (starter), and the following Facility-Administered Medications: capsaicin topical system. His primarily concern today is the Foot Pain (bilateral) ? ?Initial Vital Signs:  ?Pulse/HCG Rate:  ?(!) 108 ?  ?Temp: (!) 97 ?F (36.1 ?C) ?Resp: 18 ?BP: 127/77 ?SpO2: 99 % ? ?BMI: Estimated body mass index is 34.86 kg/m? as calculated from the following: ?  Height as of this encounter: 6' (1.829 m). ?  Weight as of this encounter: 257 lb (116.6 kg). ? ?Risk Assessment: ?Allergies: Reviewed. He is allergic to naproxen and morphine and related.  ?Allergy Precautions: None required ?Coagulopathies: Reviewed. None identified.  ?Blood-thinner therapy: None at this time ?Active Infection(s): Reviewed. None identified. Brian Williams is afebrile ? ?Site Confirmation: Brian Williams was asked to confirm the procedure and laterality before marking the site ?Procedure checklist: Completed ?Consent: Before the procedure and under the influence of no sedative(s), amnesic(s), or anxiolytics, the patient was informed of the treatment options, risks and possible complications. To fulfill our ethical and legal obligations, as recommended by the American Medical Association's Code of Ethics, I have informed the patient of my clinical impression; the nature and purpose of the treatment or procedure; the risks, benefits, and possible complications of the intervention; the alternatives, including doing nothing; the risk(s) and benefit(s) of the alternative treatment(s) or procedure(s); and the risk(s) and benefit(s) of doing nothing. ?The patient was provided information about the general risks and possible complications associated with the procedure. These may include, but  are not limited to: failure to achieve desired goals, infection, bleeding, organ or nerve damage, allergic reactions, paralysis, and death. ?In addition, the patient was informed of those risks and complications associated to the procedure, such as failure to decrease pain; infection; bleeding; organ or nerve damage with subsequent damage  to sensory, motor, and/or autonomic systems, resulting in permanent pain, numbness, and/or weakness of one or several areas of the body; allergic reactions; (i.e.: anaphylactic reaction); and/or death. ?Furthermore, the patient was informed of those risks and complications associated with the medications. These include, but are not limited to: allergic reactions (i.e.: anaphylactic or anaphylactoid reaction(s)); adrenal axis suppression; blood sugar elevation that in diabetics may result in ketoacidosis or comma; water retention that in patients with history of congestive heart failure may result in shortness of breath, pulmonary edema, and decompensation with resultant heart failure; weight gain; swelling or edema; medication-induced neural toxicity; particulate matter embolism and blood vessel occlusion with resultant organ, and/or nervous system infarction; and/or aseptic necrosis of one or more joints. ?Finally, the patient was informed that Medicine is not an exact science; therefore, there is also the possibility of unforeseen or unpredictable risks and/or possible complications that may result in a catastrophic outcome. The patient indicated having understood very clearly. We have given the patient no guarantees and we have made no promises. Enough time was given to the patient to ask questions, all of which were answered to the patient's satisfaction. Brian Williams has indicated that he wanted to continue with the procedure. ?Attestation: I, the ordering provider, attest that I have discussed with the patient the benefits, risks, side-effects, alternatives, likelihood of  achieving goals, and potential problems during recovery for the procedure that I have provided informed consent. ?Date  Time: 12/27/2021 10:43 AM ? ?Pre-Procedure Preparation:  ?Monitoring: As per clinic protocol. Respiration, ETCO2, SpO2, BP, heart rate and rhythm monitor placed and checked for adequate function ?Safety Precautions: Patient was assessed for positional comfort and pressure points before starting the procedure. ?Time-out: I initiated and conducted the "Time-out" before starting the procedure, as per protocol. The patient was asked to participate by confirming the accuracy of the "Time Out" information. Verification of the correct person, site, and procedure were performed and confirmed by me, the nursing staff, and the patient. "Time-out" conducted as per Joint Commission's Universal Protocol (UP.01.01.01). ?Time:   ? ?Description of Procedure:          ? ?Area Prepped: Entire foot Region ?DuraPrep (Iodine Povacrylex [0.7% available iodine] and Isopropyl Alcohol, 74% w/w) ? ?1 Qutenza patch applied to the plantar surface of the left foot, 1 Qutenza patch applied to the dorsal surface of the left foot. ?1 Qutenza patch applied to the plantar surface of the right foot, 1 Qutenza patch applied to the dorsal surface of the right foot.  Coban applied. ?Treatment duration 45 minutes. ? ? ? ? ?    ?    ?      ? ?     ? ? ? ? ? ?Vitals:  ? 12/27/21 1051 12/27/21 1052  ?BP:  127/77  ?Pulse:  (!) 108  ?Resp:  18  ?Temp:  (!) 97 ?F (36.1 ?C)  ?SpO2:  99%  ?Weight: 257 lb (116.6 kg)   ?Height: 6' (1.829 m)   ?  ? ? ?Post-operative Assessment:  ?Post-procedure Vital Signs:  ?Pulse/HCG Rate:  ?(!) 108  ?Temp:  ?(!) 97 ?F (36.1 ?C) ?Resp: 18 ?BP: 127/77 ?SpO2: 99 % ? ?EBL: None ? ?Complications: No immediate post-treatment complications observed by team, or reported by patient. ? ?Note: The patient tolerated the entire procedure well. A repeat set of vitals were taken after the procedure and the patient was kept  under observation following institutional policy, for this type of procedure. Post-procedural neurological assessment was  performed, showing return to baseline, prior to discharge. The patient was provided with pos

## 2021-12-28 ENCOUNTER — Telehealth: Payer: Self-pay

## 2021-12-28 NOTE — Telephone Encounter (Signed)
Spoke to patient regarding procedure yesterday. States was fine after procedure yesterday but is not feeling any relief today. Instructed to use heat today and informed that it would take 5-7 days to get relief from the steroid. He verbalized understanding.  ?

## 2021-12-28 NOTE — Telephone Encounter (Signed)
Post procedure follow up.  LM 

## 2022-01-08 ENCOUNTER — Other Ambulatory Visit: Payer: Self-pay | Admitting: Primary Care

## 2022-01-08 ENCOUNTER — Telehealth: Payer: Self-pay | Admitting: Student in an Organized Health Care Education/Training Program

## 2022-01-08 DIAGNOSIS — F331 Major depressive disorder, recurrent, moderate: Secondary | ICD-10-CM

## 2022-01-08 DIAGNOSIS — F411 Generalized anxiety disorder: Secondary | ICD-10-CM

## 2022-01-08 NOTE — Telephone Encounter (Signed)
Any suggestions? Had Qutenza on 12-27-21. ?

## 2022-01-08 NOTE — Telephone Encounter (Signed)
Patient states pain is increased and can't sleep. Please give patient a call. Thanks ?

## 2022-01-17 DIAGNOSIS — M170 Bilateral primary osteoarthritis of knee: Secondary | ICD-10-CM | POA: Diagnosis not present

## 2022-01-17 DIAGNOSIS — I5022 Chronic systolic (congestive) heart failure: Secondary | ICD-10-CM | POA: Diagnosis not present

## 2022-01-17 DIAGNOSIS — I2510 Atherosclerotic heart disease of native coronary artery without angina pectoris: Secondary | ICD-10-CM | POA: Diagnosis not present

## 2022-01-17 DIAGNOSIS — Z794 Long term (current) use of insulin: Secondary | ICD-10-CM | POA: Diagnosis not present

## 2022-01-17 DIAGNOSIS — E1142 Type 2 diabetes mellitus with diabetic polyneuropathy: Secondary | ICD-10-CM | POA: Diagnosis not present

## 2022-01-17 DIAGNOSIS — I25118 Atherosclerotic heart disease of native coronary artery with other forms of angina pectoris: Secondary | ICD-10-CM | POA: Diagnosis not present

## 2022-01-18 ENCOUNTER — Encounter: Payer: Self-pay | Admitting: Cardiovascular Disease

## 2022-01-18 ENCOUNTER — Ambulatory Visit: Payer: Medicare Other | Admitting: Cardiovascular Disease

## 2022-01-18 VITALS — BP 100/60 | HR 86 | Ht 72.0 in | Wt 269.0 lb

## 2022-01-18 DIAGNOSIS — I25118 Atherosclerotic heart disease of native coronary artery with other forms of angina pectoris: Secondary | ICD-10-CM

## 2022-01-18 DIAGNOSIS — I420 Dilated cardiomyopathy: Secondary | ICD-10-CM | POA: Diagnosis not present

## 2022-01-18 DIAGNOSIS — I5022 Chronic systolic (congestive) heart failure: Secondary | ICD-10-CM

## 2022-01-18 NOTE — Patient Instructions (Signed)
Medication Instructions:  No changes  If you need a refill on your cardiac medications before your next appointment, please call your pharmacy.    Lab work: No new labs needed   Testing/Procedures: No new testing needed   Follow-Up: At [facility], you and your health needs are our priority.  As part of our continuing mission to provide you with exceptional heart care, we have created designated Provider Care Teams.  These Care Teams include your primary Cardiologist (physician) and Advanced Practice Providers (APPs -  Physician Assistants and Nurse Practitioners) who all work together to provide you with the care you need, when you need it.  You will need a follow up appointment in 6 months  Providers on your designated Care Team:   Christopher Berge, NP Ryan Dunn, PA-C Cadence Furth, PA-C  COVID-19 Vaccine Information can be found at: [email or web address] For questions related to vaccine distribution or appointments, please email [email or web address] or call [phone]   

## 2022-01-18 NOTE — Progress Notes (Signed)
Cardiology Office Note ? ?Date:  01/18/2022  ? ?ID:  Brian Williams, DOB [birth year], MRN [id number] ? ?PCP:  Brian Koch, NP  ? ?Chief Complaint  ?Patient presents with  ? OTher  ?  6 month follow up -- Meds reviewed verbally with patient.   ? ? ?HPI:  ?Brian Williams is Williams pleasant 61 year old gentleman with history of  ?coronary artery disease,  ?long history of smoking,  ?diabetes type 2 poorly controlled with  hemoglobin A1c of 9, followed by endocrine/Dr. Gabriel Williams, ?cardiac catheterization ?12 dating back to 2002,  ?ischemic cardiomyopathy,  ?echocardiogram March 2016 showing ejection fraction 20-25%,  ? sustained VT, VT ablation in [place] at [facility] ?sleep apnea, on CPAP, ?generator change in 2016, ICD in place,  ? previously seen by seen by [facility] cardiology heart failure/transplant team, [facility].  ?EF 35% in 2017 ?cath 06/2019 ?EF 20 to 25%  ?who presents for routine follow-up of his coronary artery disease, CABG, September 2017 ? ?seen by myself in clinic July 2022 ?Followed by CHF clinic at [facility] ?Seen by Dr. Caryl Williams September 2022 ? ?Seen by [facility] yesterday: ?Told: to  increase Metoprolol to '150mg'$  for 2-3 week  ?then increase to '200mg'$  daily. ?Echo done yesterday, "no change" ? ?BP low,  no sxof orthostasis ?Denies significant leg swelling, feels that symptoms are stable ? ?Steroid injections yesterday,knees, ?glucose running high, glu 450, increased insulin on his own ?Followed by endocrinology ?A1c previously 9-11 more recently running 6-7 ? ?He denies any anginal symptoms, no significant shortness of breath or chest pain on exertion ?No regular exercise program ? ?Echocardiogram December 2022 in the hospital ? 1. Left ventricular ejection fraction, by estimation, is 25 to 30%. The  ?left ventricle has severely decreased function. The left ventricle  ?demonstrates global hypokinesis. Unable to exclude regional wall motion  ?abnormality. The left ventricular internal  ? cavity size was moderately dilated. Left  ventricular diastolic parameters  ?are indeterminate.  ? 2. Right ventricular systolic function was not well visualized. Function  ?appears to be depressed. The right ventricular size is normal.  ? 3. The mitral valve was not well visualized. Mild mitral valve  ?regurgitation.  ? 4. The aortic valve is normal in structure. Aortic valve regurgitation is  ?not visualized. No aortic stenosis is present.  ? ?Going on cruise fall 2023 ? ?EKG personally reviewed by myself on todays visit ?Sinus rhythm with second-degree AV block type I, PVCs, ST-T wave abnormality concerning for anterolateral ischemia ? ?Other past medical history reviewed ?Farxiga/jardiance: yeast infection in groin ? ?Prior hospitalizations reviewed ?In the ER, admitted 12/08/2020 ?Acute on chronic diarrhea: ? dehydration: Metabolic acidosis ?GI pathogen positive for norovirus. ?Rifaximin TID for 14 day, too expensive ?Changed to bactrim ?(Lasix had been cut back to 40 daily post d/c) ? ?gabapentin 600 TID by neurology  For neck pain 12/01/2020 ? ?Cath at 06/2019 at [facility] ?Marland Kitchen There is Significant 3-vessel coronary artery disease including 60%  ?stenosis of distal LMCA, complete occlusion of the mLAD & mRCA and 80-90%  ?stenosis of OM2 & OM3.  ?2. Patent LIMA-mLAD, VG-rPDA and VG-OM2. VG-D known to be occluded.  ?3. Normal right and left ventricular filling pressures.  ?4. Normal cardiac output.  ? ?CABG By Dr. Cyndia Williams September 2017 ?Left internal mammary graft to the LAD ?SVG to diagonal, SVG to OM, SVG to PDA ? ?cath on 05/10/2016 showing Williams 70% eccentric distal LM stenosis, 80% proximal LAD in-stent restenosis, 80% diagonal stenosis, and tandem 90% proximal  and mid RCA stenoses with mild in-stent narrowing. The LVEF is visually 35-45% ?  ?Previously seen in [place] for his cardiac issues. ?History dates back to 2002 when he had distal RCA disease. Several catheterizations over the next several years for in-stent restenosis of the distal RCA.  Later cath, stent placed to the LAD. ?Most recent catheterization appears to be April 2011 showing patent LAD and RCA stent. ? ?PMH:   has Williams past medical history of Acute appendicitis (04/03/2021), Acute pyelonephritis (09/07/2021), AICD (automatic cardioverter/defibrillator) present, Allergy, Anxiety, Appendicitis, Arthritis, Back pain, Bacteremia due to Klebsiella pneumoniae (09/07/2021), Cardiomyopathy (Brian [facility]), CHF (congestive heart failure) (Brian [facility]), Coronary artery disease, Depression, Diabetes mellitus without complication ([facility]), GERD (gastroesophageal reflux disease), Headache, History of bronchitis, History of colon polyps, History of hiatal hernia, Hyperlipidemia, Hypertension, MI (myocardial infarction) (Brian [facility]) (2001), Multifocal pneumonia (09/07/2021), Obesity, Peripheral neuropathy, Pneumonia, PONV (postoperative nausea and vomiting), Presence of permanent cardiac pacemaker, Sepsis (Brian [facility]) (09/06/2021), Shortness of breath dyspnea, Sleep apnea, and Ventricular tachycardia (Brian [facility]). ? ?PSH:    ?Past Surgical History:  ?Procedure Laterality Date  ? APPENDECTOMY    ? BACK SURGERY    ? CARDIAC CATHETERIZATION    ? CARDIAC CATHETERIZATION Left 05/10/2016  ? Procedure: Left Heart Cath and Coronary Angiography;  Surgeon: Brian Merritts, MD;  Location: [facility];  Service: Cardiovascular;  Laterality: Left;  ? CARDIAC DEFIBRILLATOR PLACEMENT  10/16/2007  ? ICD Model number [id number] serial number [id number]  ? CARDIAC ELECTROPHYSIOLOGY STUDY AND ABLATION    ? CHOLECYSTECTOMY  2001  ? COLONOSCOPY    ? COLONOSCOPY WITH PROPOFOL N/Williams 08/28/2018  ? Procedure: COLONOSCOPY WITH PROPOFOL;  Surgeon: Brian Bellows, MD;  [facility];  Service: Gastroenterology;  Laterality: N/Williams;  ? COLONOSCOPY WITH PROPOFOL N/Williams 10/06/2021  ? Procedure: COLONOSCOPY WITH PROPOFOL;  Surgeon: Brian Bellows, MD;  Location[facility];  Service: Gastroenterology;  Laterality: N/Williams;  ? CORONARY ANGIOPLASTY WITH STENT PLACEMENT    ? 7 stents   ? CORONARY ARTERY BYPASS GRAFT N/Williams 05/24/2016  ? Procedure: CORONARY ARTERY BYPASS GRAFTING (CABG) x four , using left internal mammary artery and left leg greater saphenous vein harvested endoscopically;  Surgeon: Gaye Pollack, MD;  Location: [facility];  Service: Open Heart Surgery;  Laterality: N/Williams;  ? EP IMPLANTABLE DEVICE N/Williams 06/16/2015  ? Procedure: ICD Generator Changeout;  Surgeon: Deboraha Sprang, MD;  Location: [facility];  Service: Cardiovascular;  Laterality: N/Williams;  ? ESOPHAGOGASTRODUODENOSCOPY    ? INSERT / REPLACE / REMOVE PACEMAKER    ? KNEE SURGERY    ? bilateral knee   ? [name] WITHOUT CARDIOVERSION N/Williams 05/24/2016  ? Procedure: TRANSESOPHAGEAL ECHOCARDIOGRAM ([name]);  Surgeon: Gaye Pollack, MD;  Location: [facility];  Service: Open Heart Surgery;  Laterality: N/Williams;  ? VASECTOMY    ? ? ?Current Outpatient Medications  ?Medication Sig Dispense Refill  ? aspirin EC 81 MG tablet Take 81 mg by mouth daily.  90 tablet   ? clopidogrel (PLAVIX) 75 MG tablet Take 1 tablet (75 mg total) by mouth daily. 90 tablet 0  ? escitalopram (LEXAPRO) 20 MG tablet TAKE 1 TABLET BY MOUTH DAILY FOR ANXIETY AND DEPRESSION. GENERIC EQUIVALENT FOR LEXAPRO 90 tablet 0  ? ezetimibe (ZETIA) 10 MG tablet Take 1 tablet (10 mg total) by mouth daily. For cholesterol. 90 tablet 0  ? furosemide (LASIX) 40 MG tablet Take 80 mg by mouth daily.    ? gabapentin (NEURONTIN) 600 MG tablet TAKE TWO TABLETS BY MOUTH  TWICE Williams DAY FOR PAIN 360 tablet 0  ? gemfibrozil (LOPID) 600 MG tablet TAKE 1 TABLET( '600MG'$  TOTAL) BY MOUTH DAILY FOR CHOLESTEROL Strength: 600 mg 90 tablet 0  ? insulin regular human CONCENTRATED (HUMULIN R) 500 UNIT/ML injection Inject 0-25 Units into the skin daily at 12 noon.    ? methocarbamol (ROBAXIN) 750 MG tablet Take 1 tablet (750 mg total) by mouth every 8 (eight) hours as needed for muscle spasms. 90 tablet 5  ? metoprolol succinate (TOPROL-XL) 100 MG 24 hr tablet Take 150 mg by mouth daily.    ? nitroGLYCERIN (NITROSTAT)  0.4 MG SL tablet Place 1 tablet (0.4 mg total) under the tongue every 5 (five) minutes as needed for chest pain. 25 tablet 1  ? omega-3 acid ethyl esters (LOVAZA) 1 g capsule Take 1 g by mouth 2 (two) ti

## 2022-01-22 ENCOUNTER — Encounter: Payer: Self-pay | Admitting: Student in an Organized Health Care Education/Training Program

## 2022-01-23 ENCOUNTER — Ambulatory Visit
Payer: Medicare Other | Attending: Student in an Organized Health Care Education/Training Program | Admitting: Student in an Organized Health Care Education/Training Program

## 2022-01-23 ENCOUNTER — Encounter: Payer: Self-pay | Admitting: Student in an Organized Health Care Education/Training Program

## 2022-01-23 DIAGNOSIS — M961 Postlaminectomy syndrome, not elsewhere classified: Secondary | ICD-10-CM | POA: Diagnosis not present

## 2022-01-23 DIAGNOSIS — G894 Chronic pain syndrome: Secondary | ICD-10-CM | POA: Diagnosis not present

## 2022-01-23 DIAGNOSIS — E1142 Type 2 diabetes mellitus with diabetic polyneuropathy: Secondary | ICD-10-CM

## 2022-01-23 DIAGNOSIS — M47816 Spondylosis without myelopathy or radiculopathy, lumbar region: Secondary | ICD-10-CM | POA: Diagnosis not present

## 2022-01-23 NOTE — Progress Notes (Signed)
Patient: NERO SAWATZKY  Service Category: E/M  Provider: Gillis Santa, MD  ?DOB: 24-Sep-1960  DOS: 01/23/2022  Location: Office  ?MRN: [id number]  Setting: Ambulatory outpatient  Referring Provider: Pleas Koch, NP  ?Type: Established Patient  Specialty: Interventional Pain Management  PCP: Pleas Koch, NP  ?Location: Remote location  Delivery: TeleHealth    ? ?Virtual Encounter - Pain Management ?PROVIDER NOTE: Information contained herein reflects review and annotations entered in association with encounter. Interpretation of such information and data should be left to medically-trained personnel. Information provided to patient can be located elsewhere in the medical record under "Patient Instructions". Document created using STT-dictation technology, any transcriptional errors that may result from process are unintentional.  ?  ?Contact & Pharmacy ?Preferred: [phone] ?Home: [phone] (home) ?Mobile: [phone] (mobile) ?E-mail: [email or web address]  ?[facility] [address] [place] [facility] [place], [place] [address] AT [facility] [address] [facility] [address] ?[address] [place] [address] ?Phone: [phone] Fax: [phone] ? ?[facility] ([facility]) [place] ?[place] [address] ?Phone: [phone] Fax: [phone] ?  ?Pre-screening  ?Mr. Jury offered "in-person" vs "virtual" encounter. He indicated preferring virtual for this encounter.  ? ?Reason ?COVID-19*  Social distancing based on CDC and AMA recommendations.  ? ?I contacted [name] on 01/23/2022 via telephone.      I clearly identified myself as Gillis Santa, MD. I verified that I was speaking with the correct person using two identifiers (Name: ADVAITH LAMARQUE, and date of birth: 18-Oct-1960). ? ?Consent ?I sought verbal advanced consent from [name] for virtual visit interactions. I informed Mr. Anctil of possible security and privacy concerns,  risks, and limitations associated with providing "not-in-person" medical evaluation and management services. I also informed Mr. Mah of the availability of "in-person" appointments. Finally, I informed him that there would be a charge for the virtual visit and that he could be  personally, fully or partially, financially responsible for it. Mr. Easterly expressed understanding and agreed to proceed.  ? ?Historic Elements   ?Mr. CHUKWUEBUKA CHURCHILL is a 61 y.o. year old, male patient evaluated today after our last contact on 01/08/2022. Mr. Wieman  has a past medical history of Acute appendicitis (04/03/2021), Acute pyelonephritis (09/07/2021), AICD (automatic cardioverter/defibrillator) present, Allergy, Anxiety, Appendicitis, Arthritis, Back pain, Bacteremia due to Klebsiella pneumoniae (09/07/2021), Cardiomyopathy ([place]), CHF (congestive heart failure) ([place]), Coronary artery disease, Depression, Diabetes mellitus without complication ([place]), GERD (gastroesophageal reflux disease), Headache, History of bronchitis, History of colon polyps, History of hiatal hernia, Hyperlipidemia, Hypertension, MI (myocardial infarction) ([place]) (2001), Multifocal pneumonia (09/07/2021), Obesity, Peripheral neuropathy, Pneumonia, PONV (postoperative nausea and vomiting), Presence of permanent cardiac pacemaker, Sepsis ([place]) (09/06/2021), Shortness of breath dyspnea, Sleep apnea, and Ventricular tachycardia ([place]). He also  has a past surgical history that includes Cardiac electrophysiology study and ablation; Cardiac catheterization; Knee surgery; Back surgery; Cardiac defibrillator placement (10/16/2007); Insert / replace / remove pacemaker; Cardiac catheterization (N/A, 06/16/2015); Cholecystectomy (2001); Cardiac catheterization (Left, 05/10/2016); Coronary angioplasty with stent; Colonoscopy; Esophagogastroduodenoscopy; Vasectomy; Coronary artery bypass graft (N/A, 05/24/2016); [name] without cardioversion (N/A, 05/24/2016); Colonoscopy with  propofol (N/A, 08/28/2018); Appendectomy; and Colonoscopy with propofol (N/A, 10/06/2021). Mr. Westrup has a current medication list which includes the following prescription(s): aspirin ec, clopidogrel, escitalopram, ezetimibe, furosemide, gabapentin, gemfibrozil, humulin r, methocarbamol, metoprolol succinate, nitroglycerin, omega-3 acid ethyl esters, rosuvastatin, entresto, ozempic (2 mg/dose), spironolactone, trazodone, and varenicline tartrate (starter). He  reports  that he quit smoking about 3 years ago. His smoking use included cigarettes. He has a 25.00 pack-year smoking history. He has never used smokeless tobacco. He reports that he does not drink alcohol and does not use drugs. Mr. Longsworth is allergic to naproxen and morphine and related.  ? ?HPI  ?Today, he is being contacted for a post-procedure assessment. ? ? ?Post-procedure evaluation  ?  ?Procedure:           ?Qutenza for painful diabetic polyneuropathy   ? ?1. Diabetic polyneuropathy associated with type 2 diabetes mellitus ([place])   ?2. Chronic pain syndrome   ? ?NAS-11 Pain score:  ? Pre-procedure: 6 /10  ? Post-procedure: 6 /10  ? ?   ?Effectiveness:  ?Initial hour after procedure: 0 %  ?Subsequent 4-6 hours post-procedure: 0 %  ?Analgesia past initial 6 hours: 50 % (current)  ?Ongoing improvement:  ?Analgesic:  50% ? ? ? ?Laboratory Chemistry Profile  ? ?Renal ?Lab Results  ?Component Value Date  ? BUN 23 (H) 09/09/2021  ? CREATININE 0.70 09/09/2021  ? BCR 23 (H) 04/05/2020  ? GFR 97.36 07/13/2021  ? GFRAA 111 04/05/2020  ? GFRNONAA >60 09/09/2021  ?  Hepatic ?Lab Results  ?Component Value Date  ? AST 25 09/06/2021  ? ALT 21 09/06/2021  ? ALBUMIN 3.6 09/06/2021  ? ALKPHOS 45 09/06/2021  ? HCVAB NEGATIVE 10/06/2015  ? LIPASE 25 04/03/2021  ?  ?Electrolytes ?Lab Results  ?Component Value Date  ? NA 130 (L) 09/09/2021  ? K 3.6 09/09/2021  ? CL 102 09/09/2021  ? CALCIUM 8.4 (L) 09/09/2021  ? MG 2.3 09/09/2021  ? PHOS 2.5 09/08/2021  ?  Bone ?No results  found for: [place], [id number], [id number], HD6222LN9, 25OHVITD1, 25OHVITD2, 25OHVITD3, TESTOFREE, TESTOSTERONE  ?Inflammation (CRP: Acute Phase) (ESR: Chronic Phase) ?Lab Results  ?Component Value Date  ? CRP 1.1 (H) 12/07/2020  ? ESRSEDRATE 106 (H) 09/06/2021  ? LATICACIDVEN 1.6 09/06/2021  ?    ?  ? ?Note: Above Lab results reviewed. ? ?Imaging  ?CUP PACEART REMOTE DEVICE CHECK ?Scheduled remote reviewed. Normal device function.   ? ?Next remote 91 days- JJB ? ?Assessment  ?The primary encounter diagnosis was Diabetic polyneuropathy associated with type 2 diabetes mellitus ([place]). Diagnoses of Lumbar post-laminectomy syndrome, Lumbar spondylosis, Lumbar facet arthropathy, and Chronic pain syndrome were also pertinent to this visit. ? ?Plan of Care  ? ?Patient is finding benefit with Qutenza neurolysis for painful diabetic neuropathy of his feet.  We will plan on repeating 3 months from his procedure which will be after July12, 2023. ? ?We further discussed spinal cord stimulation for persistent lower extremity neuropathic pain.  This could also be helpful for his low back pain and leg pain as he does have a history of lumbar spine surgery over 30 years ago.  His last A1c was 7.6.  I would like for his A1c to be less than 7.5 for [place] to consider spinal cord stimulation.  This will also require a work-up which includes left fluoroscopy of his interlaminar windows, thoracic and lumbar MRI and psychological evaluation.  Patient states that he has a follow-up A1c check in June.  When he follows up in July for his repeat Qutenza neurolysis, I will review his A1c and if it is less than 7.5 we can talk further about spinal cord stimulation.  I also instructed the patient to do his own research about spinal cord stimulation so that we can have a discussion when he follows  up. ? ? ?Orders:  ?Orders Placed This Encounter  ?Procedures  ? NEUROLYSIS  ?  Please order Qutenza patches from pharmacy  ?  Standing Status:   Future  ?   Standing Expiration Date:   04/25/2022  ?  Order Specific Question:   Where will this procedure be performed?  ?  Answer:   [facility]  ? ?Follow-up plan:   ?Return in about 3 months (around 7/26/

## 2022-02-06 ENCOUNTER — Ambulatory Visit (INDEPENDENT_AMBULATORY_CARE_PROVIDER_SITE_OTHER): Payer: Medicare Other

## 2022-02-06 DIAGNOSIS — I255 Ischemic cardiomyopathy: Secondary | ICD-10-CM | POA: Diagnosis not present

## 2022-02-06 LAB — CUP PACEART REMOTE DEVICE CHECK
Battery Remaining Longevity: 35 mo
Battery Remaining Percentage: 39 %
Battery Voltage: 2.89 V
Brady Statistic AP VP Percent: 1 %
Brady Statistic AP VS Percent: 2.1 %
Brady Statistic AS VP Percent: 29 %
Brady Statistic AS VS Percent: 66 %
Brady Statistic RA Percent Paced: 1 %
Brady Statistic RV Percent Paced: 30 %
Date Time Interrogation Session: 20230523091135
HighPow Impedance: 43 Ohm
HighPow Impedance: 43 Ohm
Implantable Lead Implant Date: 20160929
Implantable Lead Implant Date: 20160929
Implantable Lead Location: 753859
Implantable Lead Location: 753860
Implantable Lead Model: 7071
Implantable Pulse Generator Implant Date: 20160929
Lead Channel Impedance Value: 350 Ohm
Lead Channel Impedance Value: 400 Ohm
Lead Channel Pacing Threshold Amplitude: 0.75 V
Lead Channel Pacing Threshold Amplitude: 0.75 V
Lead Channel Pacing Threshold Pulse Width: 0.4 ms
Lead Channel Pacing Threshold Pulse Width: 0.5 ms
Lead Channel Sensing Intrinsic Amplitude: 11.7 mV
Lead Channel Sensing Intrinsic Amplitude: 3.5 mV
Lead Channel Setting Pacing Amplitude: 2 V
Lead Channel Setting Pacing Amplitude: 2.5 V
Lead Channel Setting Pacing Pulse Width: 0.5 ms
Lead Channel Setting Sensing Sensitivity: 0.5 mV
Pulse Gen Serial Number: 7306375

## 2022-02-09 DIAGNOSIS — Z794 Long term (current) use of insulin: Secondary | ICD-10-CM | POA: Diagnosis not present

## 2022-02-09 DIAGNOSIS — E118 Type 2 diabetes mellitus with unspecified complications: Secondary | ICD-10-CM | POA: Diagnosis not present

## 2022-02-09 DIAGNOSIS — G4730 Sleep apnea, unspecified: Secondary | ICD-10-CM | POA: Diagnosis not present

## 2022-02-19 DIAGNOSIS — R200 Anesthesia of skin: Secondary | ICD-10-CM | POA: Diagnosis not present

## 2022-02-19 DIAGNOSIS — R202 Paresthesia of skin: Secondary | ICD-10-CM | POA: Diagnosis not present

## 2022-02-19 DIAGNOSIS — G5622 Lesion of ulnar nerve, left upper limb: Secondary | ICD-10-CM | POA: Diagnosis not present

## 2022-02-19 DIAGNOSIS — G5623 Lesion of ulnar nerve, bilateral upper limbs: Secondary | ICD-10-CM | POA: Diagnosis not present

## 2022-02-21 ENCOUNTER — Encounter: Payer: Self-pay | Admitting: Internal Medicine

## 2022-02-21 NOTE — Progress Notes (Signed)
Remote ICD transmission.   

## 2022-02-22 ENCOUNTER — Other Ambulatory Visit: Payer: Self-pay | Admitting: Primary Care

## 2022-02-22 DIAGNOSIS — M545 Other chronic pain: Secondary | ICD-10-CM

## 2022-02-25 IMAGING — CT CT ABD-PELV W/O CM
2 of 4 series · 17 of 46 positions shown, 19 images · non-contrast
Comparison: None.

CLINICAL DATA: Diarrhea

EXAM:
CT ABDOMEN AND PELVIS WITHOUT CONTRAST
TECHNIQUE: Multidetector CT imaging of the abdomen and pelvis was performed
following the standard protocol without IV contrast.

[Series 2: routine abd/pel wo · axial · 0.93mm/px · z∈[-1189,-694]mm · 14 of 109 slices shown, 16 images]
[im 5/109  soft-tissue]
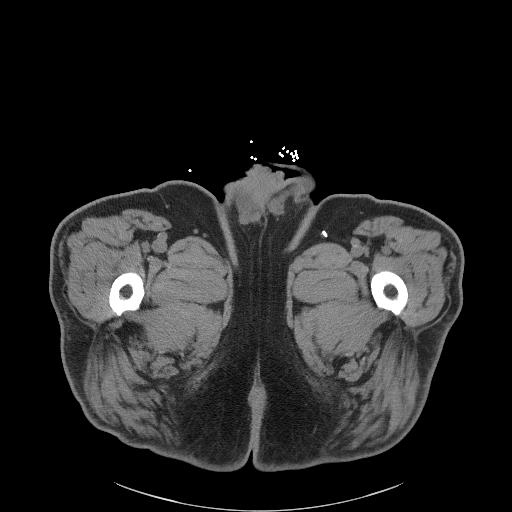
[im 5/109  bone]
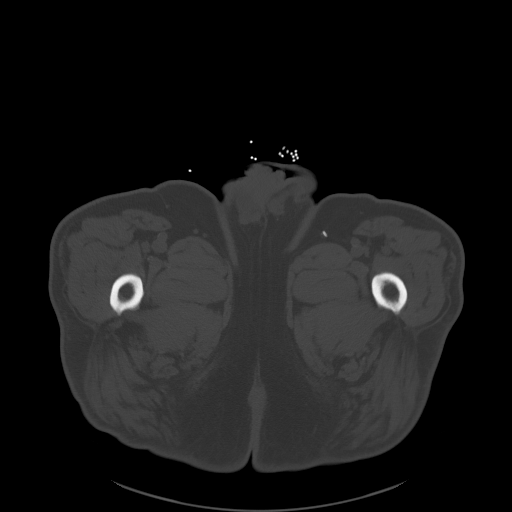
[im 14/109  soft-tissue]
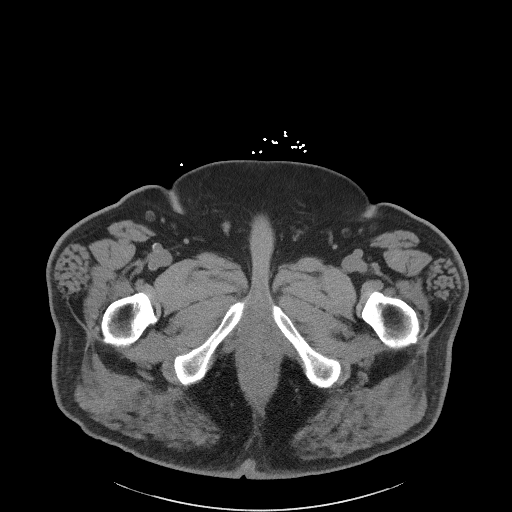
[im 23/109  soft-tissue]
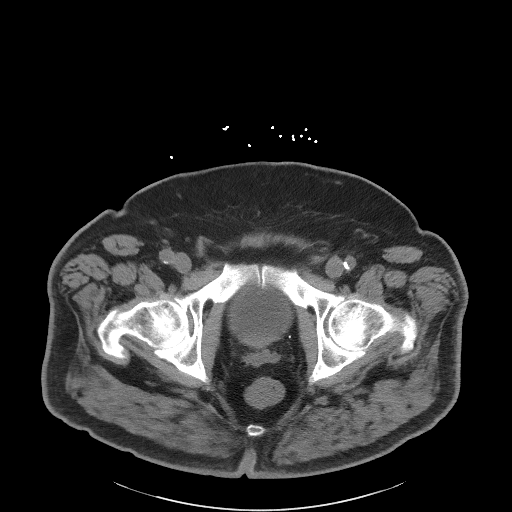
[im 28/109  soft-tissue]
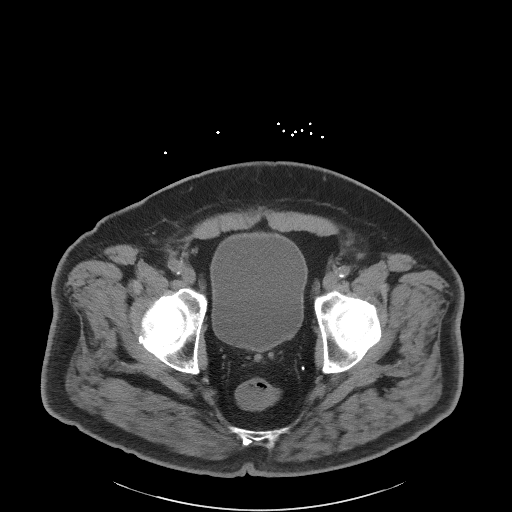
[im 37/109  soft-tissue]
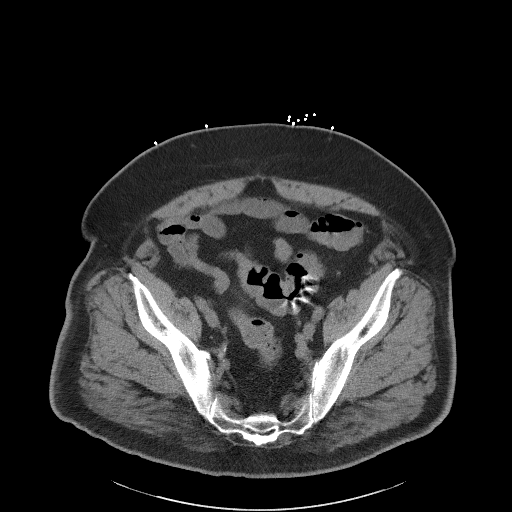
[im 46/109  soft-tissue]
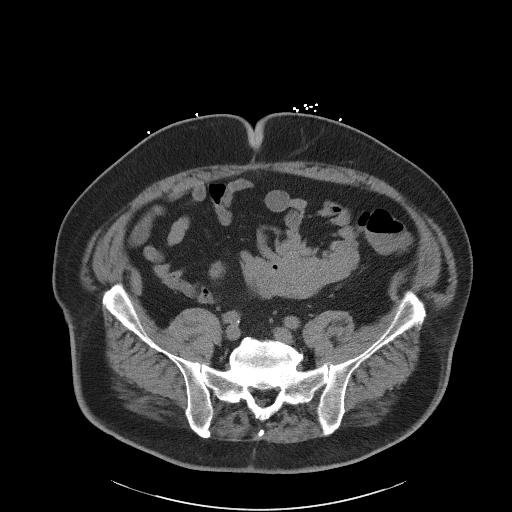
[im 50/109  soft-tissue]
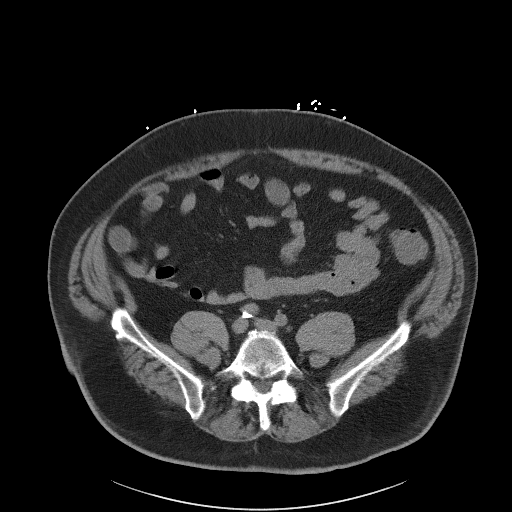
[im 59/109  soft-tissue]
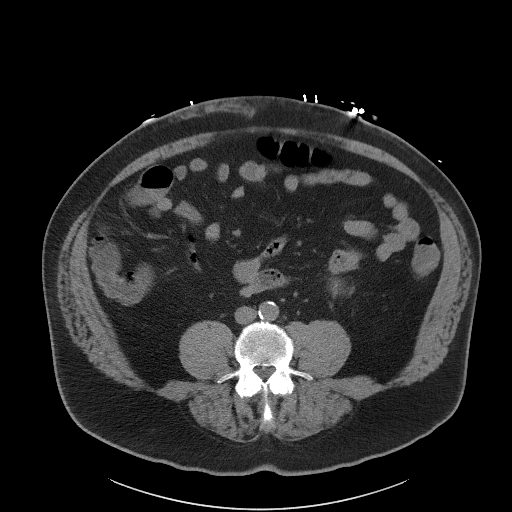
[im 64/109  soft-tissue]
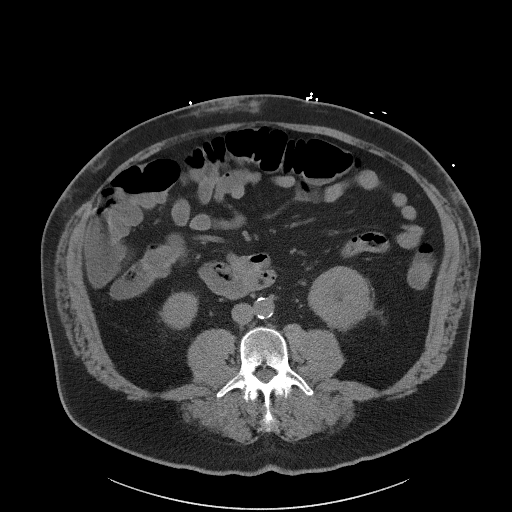
[im 64/109  bone]
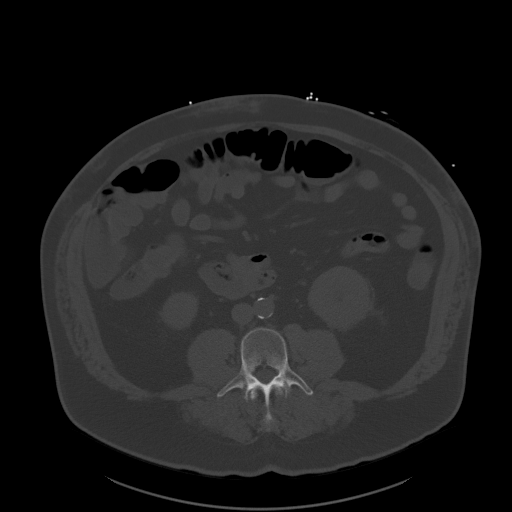
[im 73/109  soft-tissue]
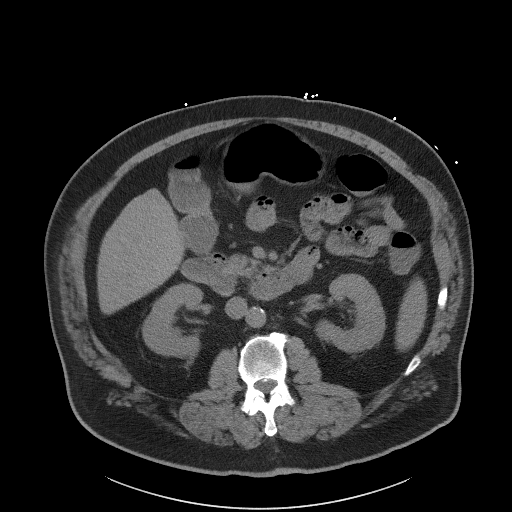
[im 82/109  soft-tissue]
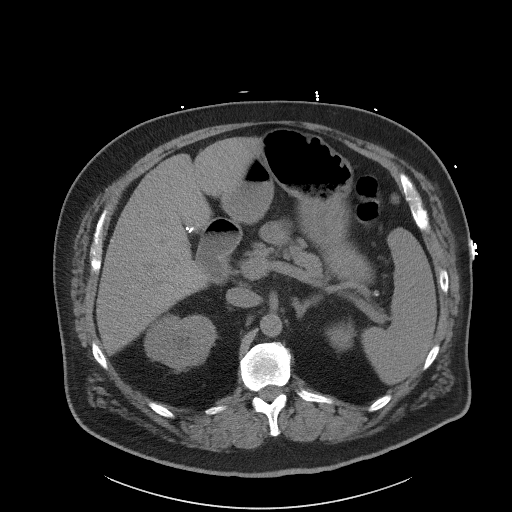
[im 86/109  soft-tissue]
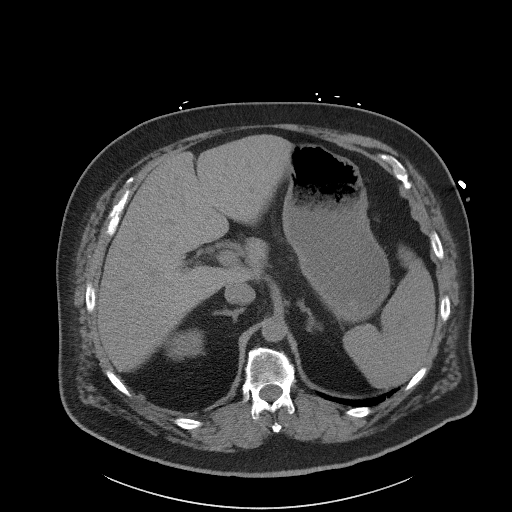
[im 95/109  soft-tissue]
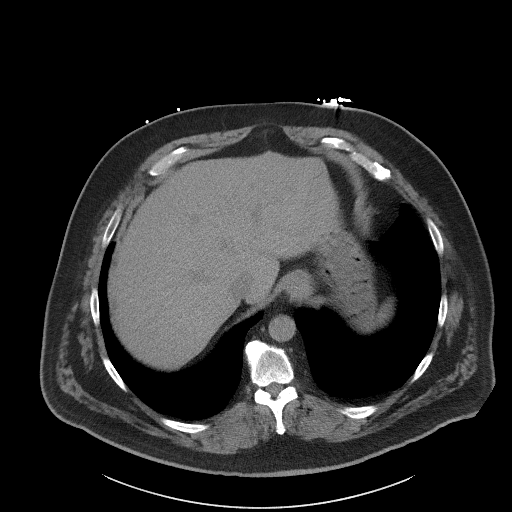
[im 104/109  soft-tissue]
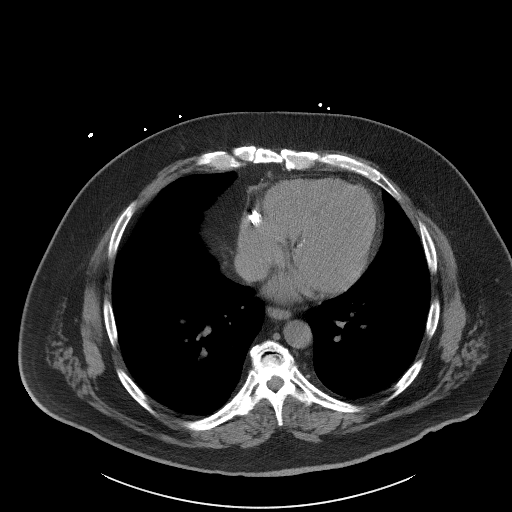

[Series 5: coronal st · coronal · 0.90mm/px · 3 of 114 slices shown]
[im 38/114  soft-tissue]
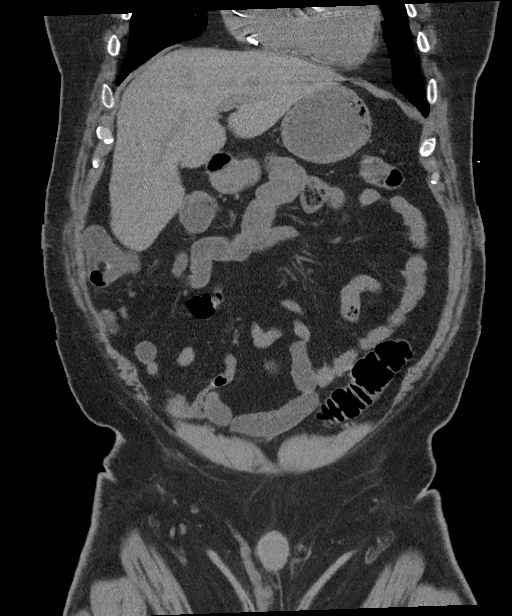
[im 51/114  soft-tissue]
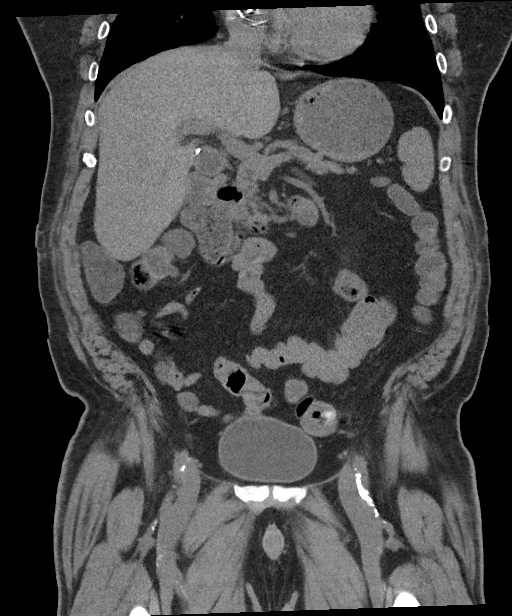
[im 63/114  soft-tissue]
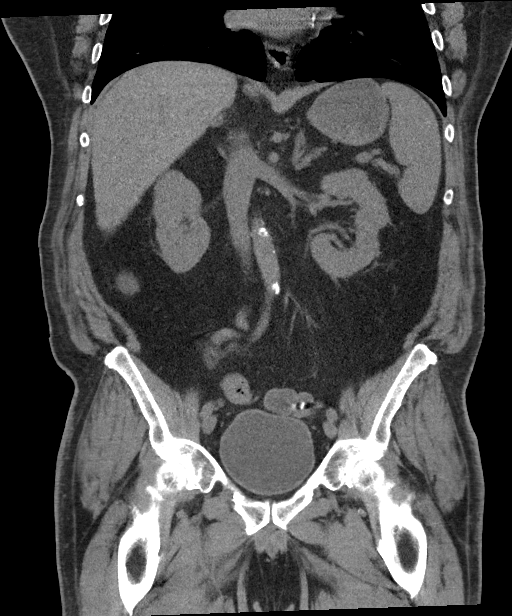

[17 of 46 positions shown; findings below may reference images not displayed]

FINDINGS: Lower chest: No acute abnormality.

Hepatobiliary: No focal liver abnormality is seen. Status post
cholecystectomy. No biliary dilatation.

Pancreas: Unremarkable.

Spleen: Unremarkable.

Adrenals/Urinary Tract: Adrenals are unremarkable. Kidneys and
bladder are unremarkable.

Stomach/Bowel: Stomach is within normal limits. Bowel is normal in
caliber. Fluid and air-filled colon keeping with history. Normal
appendix.

Vascular/Lymphatic: Aortic atherosclerosis. No enlarged lymph nodes.

Reproductive: Unremarkable.

Other: No ascites.  No acute abnormality of the abdominal wall.

Musculoskeletal: Degenerative changes of the lumbar spine. Marked
canal stenosis at L4-L5.
IMPRESSION: Fluid and air-filled colon keeping with history of diarrhea. No
significant dilatation or inflammatory changes.

## 2022-03-06 DIAGNOSIS — Z794 Long term (current) use of insulin: Secondary | ICD-10-CM | POA: Diagnosis not present

## 2022-03-06 DIAGNOSIS — E118 Type 2 diabetes mellitus with unspecified complications: Secondary | ICD-10-CM | POA: Diagnosis not present

## 2022-03-06 DIAGNOSIS — G4730 Sleep apnea, unspecified: Secondary | ICD-10-CM | POA: Diagnosis not present

## 2022-03-21 DIAGNOSIS — Z720 Tobacco use: Secondary | ICD-10-CM | POA: Diagnosis not present

## 2022-03-21 DIAGNOSIS — I259 Chronic ischemic heart disease, unspecified: Secondary | ICD-10-CM | POA: Diagnosis not present

## 2022-03-21 DIAGNOSIS — E118 Type 2 diabetes mellitus with unspecified complications: Secondary | ICD-10-CM | POA: Diagnosis not present

## 2022-03-21 DIAGNOSIS — Z9641 Presence of insulin pump (external) (internal): Secondary | ICD-10-CM | POA: Diagnosis not present

## 2022-03-22 ENCOUNTER — Telehealth: Payer: Self-pay

## 2022-03-22 NOTE — Chronic Care Management (AMB) (Signed)
Chronic Care Management Pharmacy Assistant   Name: KASSON LAMERE  MRN: [id number] DOB: [birth year]   Reason for Encounter: General Adherence  Recent office visits:  12/13/21-Katherine Clark,NP(PCP)-AWV,labs(blood counts look better)discussed screenings,vaccines, decrease lexapro to '10mg'$  daily.Strongly advised he stop increasing the doses of his medications without consulting with me first.  Recent consult visits:  02/19/22-[facility]- orthopedics-ultrasound guided cortisone injection left hand.betamethazone /bupivacaine/lidocaine 01/23/22-Bilal Lateef,MD(pain med)-telemedicine,benefit with qutenza for neuropathy- no medication changes, f/u 3 months  01/18/22-Timothy Gollan,MD(cardio)f/u CAD,Metoprolol increased at [facility] yesterday with their request to increase metoprolol succinate 100 up to 200 over the next month,EKG,f/u 6 months 01/17/22-[facility]-Cardiology-increase Metoprolol to '150mg'$  for 2-3 week and then increase to '200mg'$  daily.f/u 6 months 01/17/22-Orthopedic surgery-f/u bilateral  knee osterarthritis- steroid (kenalog) injections  12/13/21-Bilal Lateef,MD(pain medicine)- Telemedicine,bilat. Feet numbness,trial of Qutenza.f/u 1 week  Hospital visits:  None in previous 6 months  Medications: Outpatient Encounter Medications as of 03/22/2022  Medication Sig   aspirin EC 81 MG tablet Take 81 mg by mouth daily.    clopidogrel (PLAVIX) 75 MG tablet Take 1 tablet (75 mg total) by mouth daily.   escitalopram (LEXAPRO) 20 MG tablet TAKE 1 TABLET BY MOUTH DAILY FOR ANXIETY AND DEPRESSION. GENERIC EQUIVALENT FOR LEXAPRO   ezetimibe (ZETIA) 10 MG tablet Take 1 tablet (10 mg total) by mouth daily. For cholesterol.   furosemide (LASIX) 40 MG tablet Take 80 mg by mouth daily.   gabapentin (NEURONTIN) 600 MG tablet TAKE TWO TABLETS BY MOUTH TWICE A DAY FOR PAIN   gemfibrozil (LOPID) 600 MG tablet TAKE 1 TABLET( '600MG'$  TOTAL) BY MOUTH DAILY FOR CHOLESTEROL Strength: 600 mg   insulin regular human  CONCENTRATED (HUMULIN R) 500 UNIT/ML injection Inject 0-25 Units into the skin daily at 12 noon.   methocarbamol (ROBAXIN) 750 MG tablet Take 1 tablet (750 mg total) by mouth every 8 (eight) hours as needed for muscle spasms.   metoprolol succinate (TOPROL-XL) 100 MG 24 hr tablet Take 150 mg by mouth daily.   nitroGLYCERIN (NITROSTAT) 0.4 MG SL tablet Place 1 tablet (0.4 mg total) under the tongue every 5 (five) minutes as needed for chest pain.   omega-3 acid ethyl esters (LOVAZA) 1 g capsule Take 1 g by mouth 2 (two) times daily.   rosuvastatin (CRESTOR) 20 MG tablet TAKE 1 TABLET BY MOUTH AT BEDTIME FOR CHOLESTEROL   sacubitril-valsartan (ENTRESTO) 49-51 MG Take 1 tablet by mouth 2 (two) times daily.   Semaglutide, 2 MG/DOSE, (OZEMPIC, 2 MG/DOSE,) 8 MG/3ML SOPN Inject 2 mg into the skin every Friday.   spironolactone (ALDACTONE) 25 MG tablet Take 25 mg by mouth daily.   traZODone (DESYREL) 100 MG tablet TAKE 2 TABLETS BY MOUTH  EVERY NIGHT AT BEDTIME for sleep.   No facility-administered encounter medications on file as of 03/22/2022.      Contacted [name] on 04/02/22 for general disease state and medication adherence call.   Patient is not more than 5 days past due for refill on the following medications per chart history:  patient states refills are on time and he does not miss any doses.  [facility] Medications: Medication Name/mg Last Fill Days Supply Humulin R             manufacturer Rosuvastatin '20mg'$   11/06/21 90 Uses [facility] mail order Delene Loll 49-'51mg'$   2/84/13 90     manufacturer Ozempic '2mg'$    11/20/21  28     manufacturer  What concerns do you have about your medications? Gets  several medications from manufacturer and uses mail order for other medications  The patient denies side effects with their medications.   How often do you forget or accidentally miss a dose? Never  Do you use a pillbox? Yes- kitchen table in am .  Are you having any problems getting your  medications from your pharmacy? No  Has the cost of your medications been a concern? No Patient appreciates several medications through patient assistance.   Humulin,ozempic,entresto    Since last visit with CPP, no interventions have been made.   The patient has not had an ED visit since last contact.   The patient reports the following problems with their health. Left upper extremity nerve pain.Going to specialist.  Patient denies concerns or questions for [facility], Pharm[facility] at this time.   Counseled patient on:  [place] job taking medications, Importance of taking medication daily without missed doses, Benefits of adherence packaging or a pillbox, and Access to CCM team for any cost, medication or pharmacy concerns.   Care Gaps: Annual wellness visit in last year? Yes Most Recent BP reading:100/60 86-P  01/18/22  If Diabetic: Most recent A1C reading:7.6  09/07/21 Last eye exam / retinopathy screening:UTD Last diabetic foot exam: UTD  Upcoming appointments: CCM appointment on 05/07/22   [name], CPP notified  [name], [place]  [phone]

## 2022-03-23 DIAGNOSIS — E119 Type 2 diabetes mellitus without complications: Secondary | ICD-10-CM | POA: Diagnosis not present

## 2022-03-27 ENCOUNTER — Other Ambulatory Visit: Payer: Self-pay | Admitting: Family Medicine

## 2022-03-27 DIAGNOSIS — F331 Major depressive disorder, recurrent, moderate: Secondary | ICD-10-CM

## 2022-03-27 DIAGNOSIS — Z961 Presence of intraocular lens: Secondary | ICD-10-CM | POA: Diagnosis not present

## 2022-03-27 DIAGNOSIS — F411 Generalized anxiety disorder: Secondary | ICD-10-CM

## 2022-03-27 DIAGNOSIS — E113293 Type 2 diabetes mellitus with mild nonproliferative diabetic retinopathy without macular edema, bilateral: Secondary | ICD-10-CM | POA: Diagnosis not present

## 2022-03-28 LAB — HM DIABETES EYE EXAM

## 2022-04-03 ENCOUNTER — Encounter: Payer: Self-pay | Admitting: Primary Care

## 2022-04-04 ENCOUNTER — Other Ambulatory Visit: Payer: Self-pay | Admitting: Primary Care

## 2022-04-04 DIAGNOSIS — E7800 Pure hypercholesterolemia, unspecified: Secondary | ICD-10-CM

## 2022-04-04 DIAGNOSIS — E113293 Type 2 diabetes mellitus with mild nonproliferative diabetic retinopathy without macular edema, bilateral: Secondary | ICD-10-CM | POA: Diagnosis not present

## 2022-04-11 ENCOUNTER — Encounter: Payer: Self-pay | Admitting: Student in an Organized Health Care Education/Training Program

## 2022-04-11 ENCOUNTER — Ambulatory Visit
Payer: Medicare Other | Attending: Student in an Organized Health Care Education/Training Program | Admitting: Student in an Organized Health Care Education/Training Program

## 2022-04-11 VITALS — BP 127/74 | HR 84 | Temp 98.1°F | Resp 16 | Ht 72.0 in | Wt 260.0 lb

## 2022-04-11 DIAGNOSIS — G5623 Lesion of ulnar nerve, bilateral upper limbs: Secondary | ICD-10-CM | POA: Insufficient documentation

## 2022-04-11 DIAGNOSIS — E1142 Type 2 diabetes mellitus with diabetic polyneuropathy: Secondary | ICD-10-CM | POA: Diagnosis not present

## 2022-04-11 MED ORDER — DULOXETINE HCL 20 MG PO CPEP: 20.0000 mg | ORAL_CAPSULE | Freq: Every day | ORAL | 1 refills | Status: DC

## 2022-04-11 MED ORDER — CAPSAICIN-CLEANSING GEL 8 % EX KIT
4.0000 | PACK | Freq: Once | CUTANEOUS | Status: AC
  Administered 2022-04-11: 4 via TOPICAL

## 2022-04-11 NOTE — Progress Notes (Signed)
PROVIDER NOTE: Interpretation of information contained herein should be left to medically-trained personnel. Specific patient instructions are provided elsewhere under "Patient Instructions" section of medical record. This document was created in part using STT-dictation technology, any transcriptional errors that may result from this process are unintentional.  Patient: Brian Williams Type: Established DOB: 1961-07-31 MRN: [id number] PCP: Pleas Koch, NP  Service: Procedure DOS: 04/11/2022 Setting: Ambulatory Location: Ambulatory outpatient facility Delivery: Face-to-face Provider: Gillis Santa, MD Specialty: Interventional Pain Management Specialty designation: 09 Location: Outpatient facility Ref. Prov.: Pleas Koch, NP    Primary Reason for Visit: Interventional Pain Management Treatment. CC: Foot Pain (bilat)    Procedure:           Qutenza for painful diabetic polyneuropathy    1. Diabetic polyneuropathy associated with type 2 diabetes mellitus ([place])   2. Ulnar neuropathy of both upper extremities     NAS-11 Pain score:   Pre-procedure: 8 /10   Post-procedure: 8 /10     Pre-op H&P Assessment:  Brian Williams is a 61 y.o. (year old), male patient, seen today for interventional treatment. He  has a past surgical history that includes Cardiac electrophysiology study and ablation; Cardiac catheterization; Knee surgery; Back surgery; Cardiac defibrillator placement (10/16/2007); Insert / replace / remove pacemaker; Cardiac catheterization (N/A, 06/16/2015); Cholecystectomy (2001); Cardiac catheterization (Left, 05/10/2016); Coronary angioplasty with stent; Colonoscopy; Esophagogastroduodenoscopy; Vasectomy; Coronary artery bypass graft (N/A, 05/24/2016); [name] without cardioversion (N/A, 05/24/2016); Colonoscopy with propofol (N/A, 08/28/2018); Appendectomy; and Colonoscopy with propofol (N/A, 10/06/2021). Brian Williams has a current medication list which includes the following  prescription(s): aspirin ec, clopidogrel, duloxetine, escitalopram, ezetimibe, furosemide, gabapentin, gemfibrozil, humulin r, methocarbamol, metoprolol succinate, omega-3 acid ethyl esters, rosuvastatin, entresto, ozempic (2 mg/dose), spironolactone, trazodone, and nitroglycerin. His primarily concern today is the Foot Pain (bilat)  Initial Vital Signs:  Pulse/HCG Rate:  89   Temp: 98.1 F (36.7 C) Resp: 16 BP: (!) 142/78 SpO2: 96 %  BMI: Estimated body mass index is 35.26 kg/m as calculated from the following:   Height as of this encounter: 6' (1.829 m).   Weight as of this encounter: 260 lb (117.9 kg).  Risk Assessment: Allergies: Reviewed. He is allergic to naproxen and morphine and related.  Allergy Precautions: None required Coagulopathies: Reviewed. None identified.  Blood-thinner therapy: None at this time Active Infection(s): Reviewed. None identified. Brian Williams is afebrile  Site Confirmation: [name] was asked to confirm the procedure and laterality before marking the site Procedure checklist: Completed Consent: Before the procedure and under the influence of no sedative(s), amnesic(s), or anxiolytics, the patient was informed of the treatment options, risks and possible complications. To fulfill our ethical and legal obligations, as recommended by the American Medical Association's Code of Ethics, I have informed the patient of my clinical impression; the nature and purpose of the treatment or procedure; the risks, benefits, and possible complications of the intervention; the alternatives, including doing nothing; the risk(s) and benefit(s) of the alternative treatment(s) or procedure(s); and the risk(s) and benefit(s) of doing nothing. The patient was provided information about the general risks and possible complications associated with the procedure. These may include, but are not limited to: failure to achieve desired goals, infection, bleeding, organ or nerve damage,  allergic reactions, paralysis, and death. In addition, the patient was informed of those risks and complications associated to the procedure, such as failure to decrease pain; infection; bleeding; organ or nerve damage with subsequent damage to sensory, motor, and/or autonomic systems, resulting in  permanent pain, numbness, and/or weakness of one or several areas of the body; allergic reactions; (i.e.: anaphylactic reaction); and/or death. Furthermore, the patient was informed of those risks and complications associated with the medications. These include, but are not limited to: allergic reactions (i.e.: anaphylactic or anaphylactoid reaction(s)); adrenal axis suppression; blood sugar elevation that in diabetics may result in ketoacidosis or comma; water retention that in patients with history of congestive heart failure may result in shortness of breath, pulmonary edema, and decompensation with resultant heart failure; weight gain; swelling or edema; medication-induced neural toxicity; particulate matter embolism and blood vessel occlusion with resultant organ, and/or nervous system infarction; and/or aseptic necrosis of one or more joints. Finally, the patient was informed that Medicine is not an exact science; therefore, there is also the possibility of unforeseen or unpredictable risks and/or possible complications that may result in a catastrophic outcome. The patient indicated having understood very clearly. We have given the patient no guarantees and we have made no promises. Enough time was given to the patient to ask questions, all of which were answered to the patient's satisfaction. Brian Williams has indicated that he wanted to continue with the procedure. Attestation: I, the ordering provider, attest that I have discussed with the patient the benefits, risks, side-effects, alternatives, likelihood of achieving goals, and potential problems during recovery for the procedure that I have provided informed  consent. Date  Time: 04/11/2022 10:08 AM  Pre-Procedure Preparation:  Monitoring: As per clinic protocol. Respiration, ETCO2, SpO2, BP, heart rate and rhythm monitor placed and checked for adequate function Safety Precautions: Patient was assessed for positional comfort and pressure points before starting the procedure. Time-out: I initiated and conducted the "Time-out" before starting the procedure, as per protocol. The patient was asked to participate by confirming the accuracy of the "Time Out" information. Verification of the correct person, site, and procedure were performed and confirmed by me, the nursing staff, and the patient. "Time-out" conducted as per Joint Commission's Universal Protocol (UP.01.01.01). Time: 1022  Description of Procedure:           Area Prepped: Entire foot Region DuraPrep (Iodine Povacrylex [0.7% available iodine] and Isopropyl Alcohol, 74% w/w)  1 Qutenza patch applied to the plantar surface of the left foot, 1 Qutenza patch applied to the dorsal surface of the left foot. 1 Qutenza patch applied to the plantar surface of the right foot, 1 Qutenza patch applied to the dorsal surface of the right foot.  Coban applied. Treatment duration 45 minutes.                              Vitals:   04/11/22 1009 04/11/22 1011  BP: (!) 142/78 127/74  Pulse: 89 84  Resp: 16   Temp: 98.1 F (36.7 C) 98.1 F (36.7 C)  TempSrc: Temporal Temporal  SpO2: 96% 98%  Weight: 260 lb (117.9 kg)   Height: 6' (1.829 m)       Post-operative Assessment:  Post-procedure Vital Signs:  Pulse/HCG Rate:  84  Temp:  98.1 F (36.7 C) Resp: 16 BP: 127/74 SpO2: 98 %  EBL: None  Complications: No immediate post-treatment complications observed by team, or reported by patient.  Note: The patient tolerated the entire procedure well. A repeat set of vitals were taken after the procedure and the patient was kept under observation following institutional policy, for  this type of procedure. Post-procedural neurological assessment was performed, showing return to baseline,  prior to discharge. The patient was provided with post-procedure discharge instructions, including a section on how to identify potential problems. Should any problems arise concerning this procedure, the patient was given instructions to immediately contact us, at any time, without hesitation. In any case, we plan to contact the patient by telephone for a follow-up status report regarding this interventional procedure.  Comments:  No additional relevant information.  Plan of Care   Patient is also complaining of increased pain along his forearm extending from his elbow to his ring finger and pinky finger secondary to ulnar neuropathy related to diabetes.  He is on high-dose gabapentin.  He has tried alpha lipoic acid in the past without any benefit.  He is also tried lidocaine patches which are not helpful.  He is try topical lidocaine which was not effective.  I discussed low-dose Cymbalta as an adjunct to his gabapentin to assist with his increased neuropathic pain.  He is on Lexapro.  I did caution him on the risk of serotonin syndrome.  If he does have any side effects with starting low-dose Cymbalta, he is instructed to discontinue and let me know.  He has an appointment with orthopedics in the upcoming weeks.  I have discussed a bilateral ulnar nerve block under ultrasound guidance.  He states that he will discuss with Dr. Roland Rack and let me know.  Medications ordered for procedure: Meds ordered this encounter  Medications   capsaicin topical system 8 % patch 4 patch   DULoxetine (CYMBALTA) 20 MG capsule    Sig: Take 1 capsule (20 mg total) by mouth daily.    Dispense:  30 capsule    Refill:  1   Medications administered: We administered capsaicin topical system.  See the medical record for exact dosing, route, and time of administration.  Follow-up plan:   Return in about 8 weeks  (around 06/06/2022) for Post Procedure Evaluation, Medication Management, virtual.      Recent Visits Date Type Provider Dept  01/23/22 Office Visit Gillis Santa, MD [facility]  Showing recent visits within past 90 days and meeting all other requirements Today's Visits Date Type Provider Dept  04/11/22 Procedure visit Gillis Santa, MD [facility]  Showing today's visits and meeting all other requirements Future Appointments Date Type Provider Dept  06/06/22 Appointment Gillis Santa, MD [facility]  Showing future appointments within next 90 days and meeting all other requirements  Disposition: Discharge home  Discharge (Date  Time): 04/11/2022; 1126 hrs.   Primary Care Physician: Pleas Koch, NP Location: [facility] Note by: Gillis Santa, MD Date: 04/11/2022; Time: 11:36 AM  Disclaimer:  Medicine is not an exact science. The only guarantee in medicine is that nothing is guaranteed. It is important to note that the decision to proceed with this intervention was based on the information collected from the patient. The Data and conclusions were drawn from the patient's questionnaire, the interview, and the physical examination. Because the information was provided in large part by the patient, it cannot be guaranteed that it has not been purposely or unconsciously manipulated. Every effort has been made to obtain as much relevant data as possible for this evaluation. It is important to note that the conclusions that lead to this procedure are derived in large part from the available data. Always take into account that the treatment will also be dependent on availability of resources and existing treatment guidelines, considered by other Pain Management Practitioners as being  common knowledge and practice, at the time of the intervention. For Medico-Legal purposes, it is also important to point out that variation in procedural  techniques and pharmacological choices are the acceptable norm. The indications, contraindications, technique, and results of the above procedure should only be interpreted and judged by a Board-Certified Interventional Pain Specialist with extensive familiarity and expertise in the same exact procedure and technique.

## 2022-04-11 NOTE — Progress Notes (Signed)
Safety precautions to be maintained throughout the outpatient stay will include: orient to surroundings, keep bed in low position, maintain call bell within reach at all times, provide assistance with transfer out of bed and ambulation.  

## 2022-04-12 ENCOUNTER — Telehealth: Payer: Self-pay

## 2022-04-12 NOTE — Telephone Encounter (Signed)
Post procedure phone call.  Patient states he is doing ok.  

## 2022-04-20 ENCOUNTER — Other Ambulatory Visit: Payer: Self-pay | Admitting: Primary Care

## 2022-04-20 DIAGNOSIS — E785 Hyperlipidemia, unspecified: Secondary | ICD-10-CM

## 2022-04-20 MED ORDER — GEMFIBROZIL 600 MG PO TABS: ORAL_TABLET | ORAL | 1 refills | Status: DC

## 2022-04-23 ENCOUNTER — Other Ambulatory Visit: Payer: Self-pay | Admitting: Student in an Organized Health Care Education/Training Program

## 2022-04-23 DIAGNOSIS — Z6841 Body Mass Index (BMI) 40.0 and over, adult: Secondary | ICD-10-CM | POA: Diagnosis not present

## 2022-04-23 DIAGNOSIS — G5622 Lesion of ulnar nerve, left upper limb: Secondary | ICD-10-CM | POA: Diagnosis not present

## 2022-04-23 DIAGNOSIS — M778 Other enthesopathies, not elsewhere classified: Secondary | ICD-10-CM | POA: Diagnosis not present

## 2022-04-27 ENCOUNTER — Encounter: Payer: Self-pay | Admitting: Internal Medicine

## 2022-05-02 ENCOUNTER — Telehealth: Payer: Self-pay

## 2022-05-02 NOTE — Chronic Care Management (AMB) (Signed)
Chronic Care Management Pharmacy Assistant   Name: Brian Williams  MRN: [id number] DOB: 1960-10-13   Reason for Encounter: Reminder Call   Conditions to be addressed/monitored: CAD, HTN, HLD, and DMII  Recent office visits:  12/13/21-Katherine Clark,NP(PCP)-[facility],labs(blood counts look better)discussed screenings,vaccines, decrease lexapro to '10mg'$  daily.Strongly advised he stop increasing the doses of his medications without consulting with me first.  Recent consult visits:  04/23/22-John Poggi,MD(ortho)-evaluate left ulnar neuropathy-referral to specialist at [facility],steroid injection given.f/u PRN 02/19/22-[facility]- orthopedics-ultrasound guided cortisone injection left hand.betamethazone /bupivacaine/lidocaine 01/23/22-Bilal Lateef,MD(pain med)-telemedicine,benefit with qutenza for neuropathy- no medication changes, f/u 3 months  01/18/22-Timothy Gollan,MD(cardio)f/u CAD,Metoprolol increased at [facility] yesterday with their request to increase metoprolol succinate 100 up to 200 over the next month,EKG,f/u 6 months 01/17/22-[facility]-Cardiology-increase Metoprolol to '150mg'$  for 2-3 week and then increase to '200mg'$  daily.f/u 6 months 01/17/22-Orthopedic surgery-f/u bilateral  knee osterarthritis- steroid (kenalog) injections  12/13/21-Bilal Lateef,MD(pain medicine)- Telemedicine,bilat. Feet numbness,trial of Qutenza.f/u 1 week  Hospital visits:  None in previous 6 months  Medications: Outpatient Encounter Medications as of 05/02/2022  Medication Sig   aspirin EC 81 MG tablet Take 81 mg by mouth daily.    clopidogrel (PLAVIX) 75 MG tablet Take 1 tablet (75 mg total) by mouth daily.   DULoxetine (CYMBALTA) 20 MG capsule Take 1 capsule (20 mg total) by mouth daily.   escitalopram (LEXAPRO) 20 MG tablet TAKE 1 TABLET BY MOUTH DAILY FOR ANXIETY AND DEPRESSION. GENERIC EQUIVALENT FOR LEXAPRO   ezetimibe (ZETIA) 10 MG tablet Take 1 tablet (10 mg total) by mouth daily. For cholesterol.   furosemide (LASIX) 40 MG  tablet Take 80 mg by mouth daily.   gabapentin (NEURONTIN) 600 MG tablet TAKE TWO TABLETS BY MOUTH TWICE A DAY FOR PAIN   gemfibrozil (LOPID) 600 MG tablet TAKE 1 TABLET( '600MG'$  TOTAL) BY MOUTH DAILY FOR CHOLESTEROL Strength: 600 mg   insulin regular human CONCENTRATED (HUMULIN R) 500 UNIT/ML injection Inject 0-25 Units into the skin daily at 12 noon.   methocarbamol (ROBAXIN) 750 MG tablet Take 1 tablet (750 mg total) by mouth every 8 (eight) hours as needed for muscle spasms.   metoprolol succinate (TOPROL-XL) 100 MG 24 hr tablet Take 150 mg by mouth daily.   nitroGLYCERIN (NITROSTAT) 0.4 MG SL tablet Place 1 tablet (0.4 mg total) under the tongue every 5 (five) minutes as needed for chest pain. (Patient not taking: Reported on 04/11/2022)   omega-3 acid ethyl esters (LOVAZA) 1 g capsule Take 1 g by mouth 2 (two) times daily.   rosuvastatin (CRESTOR) 20 MG tablet TAKE 1 TABLET BY MOUTH AT BEDTIME FOR CHOLESTEROL   sacubitril-valsartan (ENTRESTO) 49-51 MG Take 1 tablet by mouth 2 (two) times daily.   Semaglutide, 2 MG/DOSE, (OZEMPIC, 2 MG/DOSE,) 8 MG/3ML SOPN Inject 2 mg into the skin every Friday.   spironolactone (ALDACTONE) 25 MG tablet Take 25 mg by mouth daily.   traZODone (DESYREL) 100 MG tablet TAKE 2 TABLETS BY MOUTH  EVERY NIGHT AT BEDTIME for sleep.   No facility-administered encounter medications on file as of 05/02/2022.    Brian Williams was contacted to remind of upcoming telephone visit with [name] on 05/07/22 at 2:15pm. Patient was reminded to have any blood glucose and blood pressure readings available for review at appointment.   Message was left reminding patient of appointment.  CCM referral has been placed prior to visit?  No   Star Rating Drugs: Medication:  Last Fill: Day Supply Humulin R  manufacturer Rosuvastatin '20mg'$                  04/05/22            90        Uses [facility] mail  order Entresto 49-'51mg'$                    12/11/21            90     manufacturer Ozempic '2mg'$                           11/20/21              28     manufacturer  [name], CPP notified  [name], [place]  [phone]

## 2022-05-07 ENCOUNTER — Ambulatory Visit: Payer: Medicare Other | Admitting: Pharmacist

## 2022-05-07 DIAGNOSIS — E7800 Pure hypercholesterolemia, unspecified: Secondary | ICD-10-CM

## 2022-05-07 DIAGNOSIS — I5022 Chronic systolic (congestive) heart failure: Secondary | ICD-10-CM

## 2022-05-07 DIAGNOSIS — I10 Essential (primary) hypertension: Secondary | ICD-10-CM

## 2022-05-07 DIAGNOSIS — E118 Type 2 diabetes mellitus with unspecified complications: Secondary | ICD-10-CM

## 2022-05-07 DIAGNOSIS — I25118 Atherosclerotic heart disease of native coronary artery with other forms of angina pectoris: Secondary | ICD-10-CM

## 2022-05-07 NOTE — Progress Notes (Signed)
Chronic Care Management Pharmacy Note  05/07/22 Name:  Brian Williams MRN:  [id number] DOB:  [birth year]  Summary: CCM F/U visit -Reviewed medications; pt affirms compliance and denies issues  Recommendations/Changes made from today's visit: -No med changes  Plan: -Transition CCM to Self Care: Patient achieved CCM goals and no longer needs to be contacted as frequently. The patient has been provided with contact information for the care management team and has been advised to call with any health related questions or concerns.     Subjective: Brian Williams is an 61 y.o. year old male who is a primary patient of Pleas Koch, NP.  The CCM team was consulted for assistance with disease management and care coordination needs.    Engaged with patient by telephone for follow up visit in response to provider referral for pharmacy case management and/or care coordination services.   Consent to Services:  The patient was given information about Chronic Care Management services, agreed to services, and gave verbal consent prior to initiation of services.  Please see initial visit note for detailed documentation.   Patient Care Team: Pleas Koch, NP as PCP - General (Internal Medicine) Rockey Situ Kathlene November, MD as PCP - Cardiology (Cardiology) Minna Merritts, MD as Consulting Physician (Cardiology) Radene Knee Ladell Pier, MD as Consulting Physician (Cardiology) Jamelle Rushing, MD as Referring Physician (Dermatology) Charlton Haws, [facility] as Pharmacist (Pharmacist) Bryson Ha, OD (Optometry)  Patient lives at home with his wife and 2 adult children. He is from [place] area, moved to [place] after he was put on disability and cost of living in [place] became too high. 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   Recent office visits: 12/13/21 NP Allie Bossier OV: annual exam - no med changes 10/12/21 NP Alma Friendly VV: c/ lethargy for several months; pt  requested to reduce lexapro to 10 mg. Repeat CBC (last HGB 8.5) F/u 1 month.  Recent consult visits: 04/23/22-John Poggi,MD(ortho)-evaluate left ulnar neuropathy-referral to specialist at [facility],steroid injection given.f/u PRN 02/19/22-[facility]- orthopedics-ultrasound guided cortisone injection left hand.betamethazone /bupivacaine/lidocaine  01/17/22 NP Zachary George [facility] Cardiology): increase metoprolol to 150 mg x 2-3 weeks, then 200 mg 11/16/21 NP Zachary George [facility] Cardiology): increase metoprolol to 100 mg (2 tab); start Chantix 10/19/21 Dr Luana Shu (cardiology): increase metoprolol to 75 mg (1.5 tab) 10/06/21 Admission for colonoscopy 08/24/21 NP Billey Chang (cardiology): f/u CAD, CHF. No med changes 08/07/21 Dr Candelaria Stagers [facility]): f/u SI arthritis 04/27/21 Dr Morton Stall [facility] cardiology): increase Delene Loll to 1/2 tab BID; restart spironolactone 12.5 mg daily if BP ok; pt is off SGLT2 d/t recurrent yeast infection   04/20/21 Dr Celine Ahr (Gen surgery): f/u appendicitis. Referred to [facility] for surgery. 03/21/21 Dr Rockey Situ [facility] cardiology): f/u; d/c carvedilol, Welchol, Jardiance, Novolog d/t pt not taking. 02/24/21 Dr Selinda Flavin [facility] endocrine): decrease insulin by 10% due to hypoglycemia; current TDD ~90 units, 50-60% decrease from 1 year prior 02/07/21 Dr Roland Rack (ortho): f/u bilateral knee pain - received bilateral steroid injections 12/28/20 PA Woodard ([facility]): eval dysentery; diarrhea resolved.  Hospital visits: Medication Reconciliation was completed by comparing discharge summary, patient's EMR and Pharmacy list, and upon discussion with patient.   Admitted to the hospital on 09/06/21 due to Sepsis. Discharge date was 09/09/21. Discharged from [facility].   -Klebsiella sepsis 2/2 pyelonephritis and pneumonia   New?Medications Started at [facility] Discharge:?? -started Cipro due to sepsis -resume medrol dosepak for gout (Uric acid 12.5)   Medications  that remain the same after Hospital  Discharge:??  -All other medications will remain the same.     Admitted to the hospital on 06/05/2021 due to acute appendicitis. Patient had Laparoscopic appendectomy.  Discharge date was 06/05/2021. Discharged from [facility].     Admitted to the hospital on 04/03/21 due to Acute appendicitis. Discharge date was 04/06/21. Discharged from [facility].   -underwent conservative mgmt due to cardiac issues   Admitted to the hospital on 12/06/20 due to Dehydration/AKI s/o diarhea. Discharge date was 12/08/20. Discharged from [facility].    Objective:  Lab Results  Component Value Date   CREATININE 0.70 09/09/2021   BUN 23 (H) 09/09/2021   GFR 97.36 07/13/2021   GFRNONAA >60 09/09/2021   GFRAA 111 04/05/2020   NA 130 (L) 09/09/2021   K 3.6 09/09/2021   CALCIUM 8.4 (L) 09/09/2021   CO2 23 09/09/2021   GLUCOSE 206 (H) 09/09/2021    Lab Results  Component Value Date/Time   HGBA1C 7.6 (H) 09/07/2021 05:29 AM   HGBA1C 6.5 02/24/2021 12:00 AM   HGBA1C 6.8 (H) 12/06/2020 09:20 PM   GFR 97.36 07/13/2021 01:10 PM   GFR 81.43 07/22/2020 08:59 AM    Last diabetic Eye exam:  Lab Results  Component Value Date/Time   HMDIABEYEEXA Retinopathy (A) 03/28/2022 12:00 AM    Last diabetic Foot exam: No results found for: "HMDIABFOOTEX"   Lab Results  Component Value Date   CHOL 125 07/22/2020   HDL 32.10 (L) 07/22/2020   LDLCALC 65 07/22/2020   LDLDIRECT 42.0 07/21/2018   TRIG 139.0 07/22/2020   CHOLHDL 4 07/22/2020       Latest Ref Rng & Units 09/06/2021    9:37 AM 07/13/2021    1:10 PM 04/04/2021    4:07 AM  Hepatic Function  Total Protein 6.5 - 8.1 g/dL 7.2  7.3  6.3   Albumin 3.5 - 5.0 g/dL 3.6  4.3  3.7   AST 15 - 41 U/L _0 ALT 0 - 44 U/L _1 Alk Phosphatase 38 - 126 U/L 45  44  30   Total Bilirubin 0.3 - 1.2 mg/dL 0.8  0.3  0.9     Lab Results  Component Value Date/Time   TSH 1.751 09/07/2021 05:29 AM   TSH 3.804 12/07/2020 05:58 AM   TSH 3.210  06/03/2015 11:54 AM       Latest Ref Rng & Units 12/13/2021   12:49 PM 09/09/2021    3:02 AM 09/08/2021    4:36 AM  CBC  WBC 4.0 - 10.5 K/uL 8.8  3.1  4.8   Hemoglobin 13.0 - 17.0 g/dL 12.2  8.5  8.9   Hematocrit 39.0 - 52.0 % 35.5  23.8  24.8   Platelets 150.0 - 400.0 K/uL 191.0  113  125    Iron/TIBC/Ferritin/ %Sat    Component Value Date/Time   IRON 123 07/25/2021 1138   TIBC 484.4 (H) 07/25/2021 1138   FERRITIN 138.7 07/25/2021 1138   IRONPCTSAT 25.4 07/25/2021 1138    No results found for: "VD25OH"  Clinical ASCVD: Yes  The ASCVD Risk score (Arnett DK, et al., 2019) failed to calculate for the following reasons:   The patient has a prior MI or stroke diagnosis       12/27/2021   10:56 AM 07/21/2021    1:23 PM 05/25/2021   10:42 AM  Depression screen PHQ 2/9  Decreased Interest  0 0 0  Down, Depressed, Hopeless 0 0 0  PHQ - 2 Score 0 0 0       07/23/2019    9:45 AM 07/08/2017    4:20 PM  GAD 7 : Generalized Anxiety Score  Nervous, Anxious, on Edge 2 3  Control/stop worrying 3 3  Worry too much - different things 3 3  Trouble relaxing 1 3  Restless 1 3  Easily annoyed or irritable 1 1  Afraid - awful might happen 1 3  Total GAD 7 Score 12 19  Anxiety Difficulty Somewhat difficult Somewhat difficult    Social History   Tobacco Use  Smoking Status Former   Packs/day: 1.00   Years: 25.00   Total pack years: 25.00   Types: Cigarettes   Quit date: 05/19/2018   Years since quitting: 3.9  Smokeless Tobacco Never   BP Readings from Last 3 Encounters:  04/11/22 127/74  01/18/22 100/60  12/27/21 127/77   Pulse Readings from Last 3 Encounters:  04/11/22 84  01/18/22 86  12/27/21 (!) 108   Wt Readings from Last 3 Encounters:  04/11/22 260 lb (117.9 kg)  01/18/22 269 lb (122 kg)  12/27/21 257 lb (116.6 kg)   BMI Readings from Last 3 Encounters:  04/11/22 35.26 kg/m  01/18/22 36.48 kg/m  12/27/21 34.86 kg/m    Assessment/Interventions: Review of  patient past medical history, allergies, medications, health status, including review of consultants reports, laboratory and other test data, was performed as part of comprehensive evaluation and provision of chronic care management services.   SDOH:  (Social Determinants of Health) assessments and interventions performed: Yes  SDOH Screenings   Alcohol Screen: Low Risk  (07/21/2021)   Alcohol Screen    Last Alcohol Screening Score (AUDIT): 0  Depression (PHQ2-9): Low Risk  (12/27/2021)   Depression (PHQ2-9)    PHQ-2 Score: 0  Financial Resource Strain: Low Risk  (07/21/2021)   Overall Financial Resource Strain (CARDIA)    Difficulty of Paying Living Expenses: Not hard at all  Food Insecurity: No Food Insecurity (07/21/2021)   Hunger Vital Sign    Worried About Running Out of Food in the Last Year: Never true    [place] in the Last Year: Never true  Housing: Low Risk  (07/21/2021)   Housing    Last Housing Risk Score: 0  Physical Activity: Inactive (07/21/2021)   Exercise Vital Sign    Days of Exercise per Week: 0 days    Minutes of Exercise per Session: 0 min  Social Connections: Socially Isolated (07/21/2021)   Social Connection and Isolation Panel [NHANES]    Frequency of Communication with Friends and Family: Never    Frequency of Social Gatherings with Friends and Family: Never    Attends Religious Services: Never    Marine scientist or Organizations: No    Attends Archivist Meetings: Never    Marital Status: Married  Stress: No Stress Concern Present (07/21/2021)   [employer] [place] of Stress : Not at all  Tobacco Use: Medium Risk (04/11/2022)   Patient History    Smoking Tobacco Use: Former    Smokeless Tobacco Use: Never    Passive Exposure: Not on file  Transportation Needs: No Transportation Needs (07/21/2021)   PRAPARE - Hydrologist (Medical): No     Lack of Transportation (Non-Medical): No    CCM Care Plan  Allergies  Allergen Reactions   Naproxen Other (See Comments), Anxiety and Rash    Causes hyperactivity causes hyperactivity   Morphine And Related Nausea And Vomiting    Morphine only    Medications Reviewed Today     Reviewed by Rise Patience, RN (Registered Nurse) on 04/11/22 at [place] List Status: <None>   Medication Order Taking? Sig Documenting Provider Last Dose Status Informant  aspirin EC 81 MG tablet [id number] Yes Take 81 mg by mouth daily.  Minna Merritts, MD Taking Active Self, Pharmacy Records  clopidogrel (PLAVIX) 75 MG tablet [id number] Yes Take 1 tablet (75 mg total) by mouth daily. Pleas Koch, NP Taking Active Self, Pharmacy Records  escitalopram (LEXAPRO) 20 MG tablet [id number] Yes TAKE 1 TABLET BY MOUTH DAILY FOR ANXIETY AND DEPRESSION. GENERIC EQUIVALENT FOR Alisia Ferrari, NP Taking Active   ezetimibe (ZETIA) 10 MG tablet [id number] Yes Take 1 tablet (10 mg total) by mouth daily. For cholesterol. Pleas Koch, NP Taking Active Self, Pharmacy Records  furosemide (LASIX) 40 MG tablet [id number] Yes Take 80 mg by mouth daily. [provider] Taking Active   gabapentin (NEURONTIN) 600 MG tablet [id number] Yes TAKE TWO TABLETS BY MOUTH TWICE A DAY FOR PAIN Pleas Koch, NP Taking Active   gemfibrozil (LOPID) 600 MG tablet [id number] Yes TAKE 1 TABLET( 600MG TOTAL) BY MOUTH DAILY FOR CHOLESTEROL Strength: 600 mg Pleas Koch, NP Taking Active   insulin regular human CONCENTRATED (HUMULIN R) 500 UNIT/ML injection [id number] Yes Inject 0-25 Units into the skin daily at 12 noon. [provider] Taking Active Self, Pharmacy Records           Med Note Tyrone Sage Jan 18, 2022  2:11 PM)    methocarbamol (ROBAXIN) 750 MG tablet [id number] Yes Take 1 tablet (750 mg total) by mouth every 8 (eight) hours as needed for muscle spasms. Gillis Santa, MD  Taking Active Self, Pharmacy Records           Med Note Tyrone Sage Jan 18, 2022  2:11 PM)    metoprolol succinate (TOPROL-XL) 100 MG 24 hr tablet [id number] Yes Take 150 mg by mouth daily. [provider] Taking Active Self, Pharmacy Records  nitroGLYCERIN (NITROSTAT) 0.4 MG SL tablet [id number] No Place 1 tablet (0.4 mg total) under the tongue every 5 (five) minutes as needed for chest pain.  Patient not taking: Reported on 04/11/2022   Pleas Koch, NP Not Taking Active Self, Pharmacy Records  omega-3 acid ethyl esters (LOVAZA) 1 g capsule [id number] Yes Take 1 g by mouth 2 (two) times daily. [provider] Taking Active Self, Pharmacy Records  rosuvastatin (CRESTOR) 20 MG tablet [id number] Yes TAKE 1 TABLET BY MOUTH AT BEDTIME FOR CHOLESTEROL Pleas Koch, NP Taking Active   sacubitril-valsartan (ENTRESTO) 49-51 MG [id number] Yes Take 1 tablet by mouth 2 (two) times daily. [provider] Taking Active Self, Pharmacy Records  Semaglutide, 2 MG/DOSE, (OZEMPIC, 2 MG/DOSE,) 8 MG/3ML SOPN [id number] Yes Inject 2 mg into the skin every Friday. [provider] Taking Active Self, Pharmacy Records  spironolactone (ALDACTONE) 25 MG tablet [id number] Yes Take 25 mg by mouth daily. [provider] Taking Active Self, Pharmacy Records  traZODone (DESYREL) 100 MG tablet [id number] Yes TAKE 2 TABLETS BY MOUTH  EVERY NIGHT AT BEDTIME for sleep. Pleas Koch, NP Taking Active Self, Pharmacy Records  Patient Active Problem List   Diagnosis Date Noted   Ulnar neuropathy of both upper extremities 04/11/2022   Chronic fatigue 10/12/2021   Chronic anemia 10/12/2021   Osteoarthritis of knee 08/29/2021   Lumbar degenerative disc disease 05/11/2021   Lumbar facet arthropathy 05/11/2021   Lumbar post-laminectomy syndrome 05/11/2021   Chronic pain syndrome 05/11/2021   Chronic SI joint pain 05/11/2021   Low back pain 05/10/2021    Depression 04/03/2021   Diarrhea 12/06/2020   Hypotension 12/06/2020   Vision changes 12/01/2020   Dizziness 11/23/2020   Diabetic neuropathy associated with type 2 diabetes mellitus ([place]) 08/05/2020   Falls, initial encounter 08/05/2020   Leg pain, bilateral 08/05/2020   Sensory ataxia 08/05/2020   Right knee pain 07/25/2020   Myalgia 01/28/2019   Major depressive disorder 07/08/2017   Erectile dysfunction 07/08/2017   Hx of CABG 07/26/2016   Coronary artery disease 05/24/2016   Atherosclerosis of native coronary artery of native heart with stable angina pectoris ([place])    History of coronary artery stent placement    Unstable angina ([place]) 05/04/2016   Obesity (BMI 30-39.9) 04/06/2016   OSA on CPAP 01/06/2016   Diabetes, polyneuropathy ([place]) 01/06/2016   Hidradenitis suppurativa 10/06/2015   Preventative health care 10/06/2015   Pure hypertriglyceridemia 09/26/2015   Chronic back pain 05/02/2015   Essential hypertension 05/02/2015   Hyperlipidemia 05/02/2015   Generalized anxiety disorder 05/02/2015   ICD (implantable cardioverter-defibrillator) in place 04/20/2015   Congestive dilated cardiomyopathy ([place]) 04/20/2015   Type 2 diabetes mellitus with complications ([place]) 45/62/5638   Chronic systolic heart failure ([place]) 09/30/2014    Immunization History  Administered Date(s) Administered   Influenza Inj Mdck Quad Pf 06/25/2018, 06/05/2019, 06/23/2021   Influenza Split 07/22/2015   Influenza,inj,Quad PF,6+ Mos 06/13/2016, 06/24/2020   Influenza-Unspecified 06/25/2018, 06/06/2019   Moderna Covid-19 Vaccine Bivalent Booster 42yr & up 06/23/2021   Moderna Sars-Covid-2 Vaccination 10/31/2019, 11/21/2019, 07/12/2020   Pfizer Covid-19 Vaccine Bivalent Booster 120yr& up 07/06/2021   Pneumococcal Polysaccharide-23 07/21/2018   Td 04/17/2018   Tdap 02/22/2018, 08/01/2021   Zoster Recombinat (Shingrix) 08/13/2020, 10/16/2020    Conditions to be addressed/monitored:   Hypertension, Hyperlipidemia, Diabetes, Heart Failure, Coronary Artery Disease, Depression, and Anxiety  Care Plan : CCM Pharmacy Care plan  Updates made by FoCharlton HawsRPAttalaince 05/07/2022 12:00 AM     Problem: Hypertension, Hyperlipidemia, Diabetes, Heart Failure, Coronary Artery Disease, Depression, and Anxiety   Priority: High     Long-Range Goal: Disease management   Start Date: 04/28/2021  Expected End Date: 04/28/2022  This Visit's Progress: On track  Recent Progress: On track  Priority: High  Note:   Current Barriers:  None identified  Pharmacist Clinical Goal(s):  Patient will contact provider office for questions/concerns as evidenced notation of same in electronic health record through collaboration with PharmD and provider.   Pharmacist Clinical Goal(s):  Patient will achieve adherence to monitoring guidelines and medication adherence to achieve therapeutic efficacy maintain control of blood sugars as evidenced by Dexcom G6  through collaboration with PharmD and provider.   Interventions: 1:1 collaboration with ClPleas KochNP regarding development and update of comprehensive plan of care as evidenced by provider attestation and co-signature Inter-disciplinary care team collaboration (see longitudinal plan of care) Comprehensive medication review performed; medication list updated in electronic medical record  Hyperlipidemia / CAD (LDL goal < 70) -Controlled - LDL 79 (10/2021); Trig 265 (much improved), previously in 900s. Pt endorses adherence with medications as prescribed -  Pt is aware of drug interaction with rosuvastatin and gemfibrozil - pt does endorse hx of leg pain -Current treatment: Ezetimibe 10 mg daily - Appropriate, Effective, Safe, Accessible Gemfibrozil 600 mg daily - Appropriate, Effective, Safe, Accessible Rosuvastatin 20 mg daily - Appropriate, Effective, Safe, Accessible Clopidogrel 75 mg daily - Appropriate, Effective, Safe,  Accessible Nitroglycerin 0.4 mg SL prn - Appropriate, Effective, Safe, Accessible Lovaza 1 g - 2 cap BID - Appropriate, Effective, Safe, Accessible OTC Fish oil 1000 mg daily - Appropriate, Effective, Safe, Accessible Aspirin 81 mg daily - Appropriate, Effective, Safe, Accessible -Current exercise habits: water aerobics few times a week -Educated on Cholesterol goals; Benefits of statin for ASCVD risk reduction; -Reviewed history of significant hypertriglyceridemia; pt requires multiple therapies for this -Recommended to continue current medication;  Diabetes (A1c goal <7%) -Not ideally controlled - A1c 7.6% (08/2021) -follows with Dr Loney Laurence (Endocrine) and pt is very happy with him -Current home glucose readings (via [place]) -Current medications: Humulin R U-500 via insulin pump (PAP) - Appropriate, Effective, Safe, Accessible Ozempic 2 mg weekly (PAP) - Appropriate, Effective, Safe, Accessible Dexcom G6 -Medications previously tried: Jardiance, Farxiga (yeast infections)  -Educated on A1c and blood sugar goals; -Recommend to continue current medication  Heart Failure / HTN (Goal: BP < 130/80; prevent exacerbations) -Controlled - pt reports checking BP 2-3 times a week; he does have history of low BP in s/o dehydration/diarrhea; pt denies recent swelling/SOB -Current home BP readings: none available -Last ejection fraction: 16%; HF type: Systolic -NYHA Class: III (marked limitation of activity) -Current treatment: Furosemide 40 mg - 2 tab daily -  Appropriate, Effective, Safe, Accessible Metoprolol succinate 200 mg daily -  Appropriate, Effective, Safe, Accessible Entresto 49-51 mg BID (PAP) - Appropriate, Effective, Safe, Accessible Spironolocatone 25 mg mg daily daily - Appropriate, Effective, Safe, Accessible -Educated on Benefits of medications for managing symptoms and prolonging life -Recommended to continue current medication  Depression/Anxiety (Goal: manage  symptoms) -Controlled - pt reports mood is well controlled; recently low-dose duloxetine was added for pain (per Dr Holley Raring) -PHQ9: 0 (12/2021) - minimal depression -GAD7: 12 (07/2019) - moderate anxiety -Connected with PCP for mental health support -Current treatment: Escitalopram 20 mg daily - Appropriate, Effective, Safe, Accessible Duloxetine 20 mg daily - Appropriate, Effective, Query Safe Trazodone 100 mg - 2 tab HS - Appropriate, Effective, Safe, Accessible -Medications previously tried/failed: duloxetine, sertraline, -Educated on Benefits of medication for symptom control -Reviewed combination of SSRI + SNRI with recent addition of duloxetine (03/2022 per pain mgmt) - pt denies any s/sx of serotonin syndrome -Recommended to continue current medication  Neuropathy (Goal: manage symptoms) -Controlled - pt reports gabapentin was started for leg pain; he does not endorse excess drowsiness/sedation -Current treatment  Gabapentin 600 mg - 2 tab BID - Appropriate, Effective, Safe, Accessible Duloxetine 20 mg daily - Appropriate, Effective, Safe, Accessible -Recommended to continue current medication  Health Maintenance -Vaccine gaps: none  Patient Goals/Self-Care Activities Patient will:  - take medications as prescribed -focus on medication adherence by pill box -check glucose via Dexcom G6, document, and provide at future appointments -check blood pressure daily, document, and provide at future appointments -collaborate with provider on medication access solutions -target a minimum of 150 minutes of moderate intensity exercise weekly      Medication Assistance:  [place] - via [employer] endocrine ([facility] mails to pt) Humulin R U-500 - Community education officer - via [employer] endocrine [employer] - via cardiology  Compliance/Adherence/Medication fill history: Care  Gaps: Foot exam (due 02/03/21)  Star-Rating Drugs: Rosuvastatin - PDC 75%; LF 04/05/22 x 90 ds  Medication  Access: Within the past 30 days, how often has patient missed a dose of medication? 0 Is a pillbox or other method used to improve adherence? Yes  Factors that may affect medication adherence? no barriers identified Are meds synced by current pharmacy? No  Are meds delivered by current pharmacy? Yes  Does patient experience delays in picking up medications due to transportation concerns? No   [facility] Reviewed: Is patient disadvantaged to use [facility]?: Yes  Current Rx insurance plan: [place] Name and location of Current pharmacy:  [place], [address] AT [address] [place] [address] Phone: [phone] Fax: [phone]  [facility] ([place]) [place], [place] [address] Phone: [phone] Fax: [phone]  [facility] services reviewed with patient today?: No  Patient requests to transfer care to [facility]?: No  Reason patient declined to change pharmacies: Disadvantaged due to insurance/mail order   Care Plan and Follow Up Patient Decision:  Patient agrees to Care Plan and Follow-up.  Plan: The patient has been provided with contact information for the care management team and has been advised to call with any health related questions or concerns.   [name], PharmD, [facility] Clinical Pharmacist [facility] [phone]

## 2022-05-08 ENCOUNTER — Ambulatory Visit (INDEPENDENT_AMBULATORY_CARE_PROVIDER_SITE_OTHER): Payer: Medicare Other

## 2022-05-08 DIAGNOSIS — I255 Ischemic cardiomyopathy: Secondary | ICD-10-CM | POA: Diagnosis not present

## 2022-05-08 DIAGNOSIS — I5022 Chronic systolic (congestive) heart failure: Secondary | ICD-10-CM

## 2022-05-08 LAB — CUP PACEART REMOTE DEVICE CHECK
Battery Remaining Longevity: 34 mo
Battery Remaining Percentage: 37 %
Battery Voltage: 2.87 V
Brady Statistic AP VP Percent: 1 %
Brady Statistic AP VS Percent: 1.9 %
Brady Statistic AS VP Percent: 30 %
Brady Statistic AS VS Percent: 66 %
Brady Statistic RA Percent Paced: 1 %
Brady Statistic RV Percent Paced: 31 %
Date Time Interrogation Session: 20230822020029
HighPow Impedance: 44 Ohm
HighPow Impedance: 44 Ohm
Implantable Lead Implant Date: 20160929
Implantable Lead Implant Date: 20160929
Implantable Lead Location: 753859
Implantable Lead Location: 753860
Implantable Lead Model: 7071
Implantable Pulse Generator Implant Date: 20160929
Lead Channel Impedance Value: 350 Ohm
Lead Channel Impedance Value: 400 Ohm
Lead Channel Pacing Threshold Amplitude: 0.75 V
Lead Channel Pacing Threshold Amplitude: 0.75 V
Lead Channel Pacing Threshold Pulse Width: 0.4 ms
Lead Channel Pacing Threshold Pulse Width: 0.5 ms
Lead Channel Sensing Intrinsic Amplitude: 10.5 mV
Lead Channel Sensing Intrinsic Amplitude: 3.9 mV
Lead Channel Setting Pacing Amplitude: 2 V
Lead Channel Setting Pacing Amplitude: 2.5 V
Lead Channel Setting Pacing Pulse Width: 0.5 ms
Lead Channel Setting Sensing Sensitivity: 0.5 mV
Pulse Gen Serial Number: 7306375

## 2022-05-09 DIAGNOSIS — E118 Type 2 diabetes mellitus with unspecified complications: Secondary | ICD-10-CM | POA: Diagnosis not present

## 2022-05-09 DIAGNOSIS — Z9641 Presence of insulin pump (external) (internal): Secondary | ICD-10-CM | POA: Diagnosis not present

## 2022-05-11 NOTE — Patient Instructions (Addendum)
Visit Information  Phone number for Pharmacist: [phone]   Goals Addressed   None     Patient Care Plan: CCM Pharmacy Care plan     Problem Identified: Hypertension, Hyperlipidemia, Diabetes, Heart Failure, Coronary Artery Disease, Depression, and Anxiety   Priority: High     Long-Range Goal: Disease management   Start Date: 04/28/2021  Expected End Date: 04/28/2022  This Visit's Progress: On track  Recent Progress: On track  Priority: High  Note:   Current Barriers:  None identified  Pharmacist Clinical Goal(s):  Patient will contact provider office for questions/concerns as evidenced notation of same in electronic health record through collaboration with PharmD and provider.   Pharmacist Clinical Goal(s):  Patient will achieve adherence to monitoring guidelines and medication adherence to achieve therapeutic efficacy maintain control of blood sugars as evidenced by Dexcom G6  through collaboration with PharmD and provider.   Interventions: 1:1 collaboration with Pleas Koch, NP regarding development and update of comprehensive plan of care as evidenced by provider attestation and co-signature Inter-disciplinary care team collaboration (see longitudinal plan of care) Comprehensive medication review performed; medication list updated in electronic medical record  Hyperlipidemia / CAD (LDL goal < 70) -Controlled - LDL 79 (10/2021); Trig 265 (much improved), previously in 900s. Pt endorses adherence with medications as prescribed -Pt is aware of drug interaction with rosuvastatin and gemfibrozil - pt does endorse hx of leg pain -Current treatment: Ezetimibe 10 mg daily - Appropriate, Effective, Safe, Accessible Gemfibrozil 600 mg daily - Appropriate, Effective, Safe, Accessible Rosuvastatin 20 mg daily - Appropriate, Effective, Safe, Accessible Clopidogrel 75 mg daily - Appropriate, Effective, Safe, Accessible Nitroglycerin 0.4 mg SL prn - Appropriate, Effective,  Safe, Accessible Lovaza 1 g - 2 cap BID - Appropriate, Effective, Safe, Accessible OTC Fish oil 1000 mg daily - Appropriate, Effective, Safe, Accessible Aspirin 81 mg daily - Appropriate, Effective, Safe, Accessible -Current exercise habits: water aerobics few times a week -Educated on Cholesterol goals; Benefits of statin for ASCVD risk reduction; -Reviewed history of significant hypertriglyceridemia; pt requires multiple therapies for this -Recommended to continue current medication;  Diabetes (A1c goal <7%) -Not ideally controlled - A1c 7.6% (08/2021) -follows with Dr Loney Laurence (Endocrine) and pt is very happy with him -Current home glucose readings (via [place]) -Current medications: Humulin R U-500 via insulin pump (PAP) - Appropriate, Effective, Safe, Accessible Ozempic 2 mg weekly (PAP) - Appropriate, Effective, Safe, Accessible Dexcom G6 -Medications previously tried: Jardiance, Farxiga (yeast infections)  -Educated on A1c and blood sugar goals; -Recommend to continue current medication  Heart Failure / HTN (Goal: BP < 130/80; prevent exacerbations) -Controlled - pt reports checking BP 2-3 times a week; he does have history of low BP in s/o dehydration/diarrhea; pt denies recent swelling/SOB -Current home BP readings: none available -Last ejection fraction: 78%; HF type: Systolic -NYHA Class: III (marked limitation of activity) -Current treatment: Furosemide 40 mg - 2 tab daily -  Appropriate, Effective, Safe, Accessible Metoprolol succinate 200 mg daily -  Appropriate, Effective, Safe, Accessible Entresto 49-51 mg BID (PAP) - Appropriate, Effective, Safe, Accessible Spironolocatone 25 mg mg daily daily - Appropriate, Effective, Safe, Accessible -Educated on Benefits of medications for managing symptoms and prolonging life -Recommended to continue current medication  Depression/Anxiety (Goal: manage symptoms) -Controlled - pt reports mood is well controlled; recently low-dose  duloxetine was added for pain (per Dr Holley Raring) -PHQ9: 0 (12/2021) - minimal depression -GAD7: 12 (07/2019) - moderate anxiety -Connected with PCP for mental health support -Current treatment: Escitalopram  20 mg daily - Appropriate, Effective, Safe, Accessible Duloxetine 20 mg daily - Appropriate, Effective, Query Safe Trazodone 100 mg - 2 tab HS - Appropriate, Effective, Safe, Accessible -Medications previously tried/failed: duloxetine, sertraline, -Educated on Benefits of medication for symptom control -Reviewed combination of SSRI + SNRI with recent addition of duloxetine (03/2022 per pain mgmt) - pt denies any s/sx of serotonin syndrome -Recommended to continue current medication  Neuropathy (Goal: manage symptoms) -Controlled - pt reports gabapentin was started for leg pain; he does not endorse excess drowsiness/sedation -Current treatment  Gabapentin 600 mg - 2 tab BID - Appropriate, Effective, Safe, Accessible Duloxetine 20 mg daily - Appropriate, Effective, Safe, Accessible -Recommended to continue current medication  Health Maintenance -Vaccine gaps: none  Patient Goals/Self-Care Activities Patient will:  - take medications as prescribed -focus on medication adherence by pill box -check glucose via Dexcom G6, document, and provide at future appointments -check blood pressure daily, document, and provide at future appointments -collaborate with provider on medication access solutions -target a minimum of 150 minutes of moderate intensity exercise weekly       Patient verbalizes understanding of instructions and care plan provided today and agrees to view in [place]. Active MyChart status and patient understanding of how to access instructions and care plan via MyChart confirmed with patient.    The patient has been provided with contact information for the care management team and has been advised to call with any health related questions or concerns.    [name],  PharmD, BCACP Clinical Pharmacist [facility] [phone]

## 2022-05-14 ENCOUNTER — Encounter: Payer: Self-pay | Admitting: Internal Medicine

## 2022-05-14 DIAGNOSIS — R202 Paresthesia of skin: Secondary | ICD-10-CM | POA: Diagnosis not present

## 2022-05-14 DIAGNOSIS — M25522 Pain in left elbow: Secondary | ICD-10-CM | POA: Diagnosis not present

## 2022-05-14 DIAGNOSIS — G5622 Lesion of ulnar nerve, left upper limb: Secondary | ICD-10-CM | POA: Diagnosis not present

## 2022-05-14 DIAGNOSIS — R200 Anesthesia of skin: Secondary | ICD-10-CM | POA: Diagnosis not present

## 2022-05-15 DIAGNOSIS — E118 Type 2 diabetes mellitus with unspecified complications: Secondary | ICD-10-CM | POA: Diagnosis not present

## 2022-05-15 DIAGNOSIS — Z794 Long term (current) use of insulin: Secondary | ICD-10-CM | POA: Diagnosis not present

## 2022-05-15 DIAGNOSIS — G4730 Sleep apnea, unspecified: Secondary | ICD-10-CM | POA: Diagnosis not present

## 2022-05-22 ENCOUNTER — Ambulatory Visit: Payer: Medicare Other | Attending: Internal Medicine | Admitting: Internal Medicine

## 2022-05-22 ENCOUNTER — Encounter: Payer: Self-pay | Admitting: Internal Medicine

## 2022-05-22 VITALS — BP 100/54 | HR 72 | Ht 72.0 in | Wt 268.0 lb

## 2022-05-22 DIAGNOSIS — I5022 Chronic systolic (congestive) heart failure: Secondary | ICD-10-CM

## 2022-05-22 DIAGNOSIS — Z95810 Presence of automatic (implantable) cardiac defibrillator: Secondary | ICD-10-CM

## 2022-05-22 DIAGNOSIS — I255 Ischemic cardiomyopathy: Secondary | ICD-10-CM | POA: Diagnosis not present

## 2022-05-22 DIAGNOSIS — D649 Anemia, unspecified: Secondary | ICD-10-CM

## 2022-05-22 DIAGNOSIS — I493 Ventricular premature depolarization: Secondary | ICD-10-CM

## 2022-05-22 NOTE — Patient Instructions (Signed)
Medication Instructions:  - Your physician recommends that you continue on your current medications as directed. Please refer to the Current Medication list given to you today.  *If you need a refill on your cardiac medications before your next appointment, please call your pharmacy*   Lab Work: - Your physician recommends that you have lab work today:   Midwife at [facility] 1st desk on the right to check in (REGISTRATION)  Lab hours: Monday- Friday (7:30 am- 5:30 pm)   If you have labs (blood work) drawn today and your tests are completely normal, you will receive your results only by: MyChart Message (if you have MyChart) OR A paper copy in the mail If you have any lab test that is abnormal or we need to change your treatment, we will call you to review the results.   Testing/Procedures: - none ordered   Follow-Up: At [facility], you and your health needs are our priority.  As part of our continuing mission to provide you with exceptional heart care, we have created designated Provider Care Teams.  These Care Teams include your primary Cardiologist (physician) and Advanced Practice Providers (APPs -  Physician Assistants and Nurse Practitioners) who all work together to provide you with the care you need, when you need it.  We recommend signing up for the patient portal called "MyChart".  Sign up information is provided on this After Visit Summary.  MyChart is used to connect with patients for Virtual Visits (Telemedicine).  Patients are able to view lab/test results, encounter notes, upcoming appointments, etc.  Non-urgent messages can be sent to your provider as well.   To learn more about what you can do with MyChart, go to [email or web address].    Your next appointment:   12 month(s)  The format for your next appointment:   In Person  Provider:   Virl Axe, MD    Other Instructions N/a  Important Information  About Sugar

## 2022-05-22 NOTE — Progress Notes (Signed)
Patient Care Team: Pleas Koch, NP as PCP - General (Internal Medicine) Rockey Situ Kathlene November, MD as PCP - Cardiology (Cardiology) Minna Merritts, MD as Consulting Physician (Cardiology) Radene Knee Ladell Pier, MD as Consulting Physician (Cardiology) Jamelle Rushing, MD as Referring Physician (Dermatology) Charlton Haws, [facility] as Pharmacist (Pharmacist) Bryson Ha, OD (Optometry)   HPI  Brian Williams is a 61 y.o. male Seen in follow-up for ICD implanted for secondary prevention after holter demonstrated VT.  Also with PVCs, both for which he has undergone ablation 2013 @ [place]  .Generator replacement 9/16.  Ischemic heart disease w multiple caths and PCI>> CABG 2017 ([place])     Followed previously at [facility] and then at [facility] and now at [facility].      [facility] notes raised the possibility of ranolazine.  Per their follow-up, he is on DAPT  No chest pain.  No edema.  Treated sleep apnea.  Chronic dyspnea.  Significant issues with his knees that precludes exercise even water aerobics; however, there is another challenge and that he and his 53 year old son share a car which limits his freedoms to do things like this.   Has STOPPED SMOKING and with this he has been eating more       DATE TEST EF   2015 Myoview 35 %   9/17 Echo([facility])  35%   9/17 Cath   Severe proximal LAD disease (ISR) Severe proximal and mid RAC disease Severe OM2 disease  Severe proximal diagonal #1 disease Moderate to severe left main disease  11/17 Myoview   28 % No Ischemia  6/19 Echo ([facility]) 25-30%   9/20 PET-stress 20-25% Fixed defects   10/20 LHC ([facility])  LIMA-LAD:p; SVG-DPA; SVG-OM  patent SVG-D known occluded   Date Cr K Mg Hgb  11/17  0./86 4.1 2.5 8.7>>12.2  9/18  0.8 3.7     11/19 0.99 4.1    5/20 0.83 4.0 2.1 11.6  6/21 0.80 Hemolyzed     12/22    10.5  5/23 0.87 4.4 1.9       Device History: ICD implanted  2009 generator replacement 2016 History of appropriate therapy:  No History of AAD therapy: Yes    Past Medical History:  Diagnosis Date   Acute appendicitis 04/03/2021   Acute pyelonephritis 09/07/2021   AICD (automatic cardioverter/defibrillator) present    Allergy    Anxiety    takes Xanax as needed   Appendicitis    Arthritis    back,knees,right shoulder   Back pain    Bacteremia due to Klebsiella pneumoniae 09/07/2021   Cardiomyopathy ([place])    CHF (congestive heart failure) ([place])    takes Lasix and Aldactone daily   Coronary artery disease    takes Plavix daily   Depression    takes Zoloft daily   Diabetes mellitus without complication (HCC)    Humulin R and Farxiga daily.Fasting blood sugar runs 140   GERD (gastroesophageal reflux disease)    Headache    History of bronchitis    History of colon polyps    benign   History of hiatal hernia    Hyperlipidemia    takes Fenofibrate,Crestor, and Zetia daily   Hypertension    takes Entresto and Coreg daily   MI (myocardial infarction) ([place]) 2001   Multifocal pneumonia 09/07/2021   Obesity    Peripheral neuropathy    Pneumonia    history of-last time about 14 yrs ago   PONV (postoperative  nausea and vomiting)    after knee surgery 25 yrs ago b/p stayed elevated for a while   Presence of permanent cardiac pacemaker    Sepsis ([place]) 09/06/2021   Shortness of breath dyspnea    with exertion   Sleep apnea    uses CPAP nightly   Ventricular tachycardia ([place])    s/p RFCA PVCs 2013    Past Surgical History:  Procedure Laterality Date   APPENDECTOMY     BACK SURGERY     CARDIAC CATHETERIZATION     CARDIAC CATHETERIZATION Left 05/10/2016   Procedure: Left Heart Cath and Coronary Angiography;  Surgeon: Minna Merritts, MD;  Location: [place] [facility];  Service: Cardiovascular;  Laterality: Left;   CARDIAC DEFIBRILLATOR PLACEMENT  10/16/2007   ICD Model number 2207-36 serial number [id number]   CARDIAC ELECTROPHYSIOLOGY STUDY AND ABLATION     CHOLECYSTECTOMY  2001   COLONOSCOPY      COLONOSCOPY WITH PROPOFOL N/A 08/28/2018   Procedure: COLONOSCOPY WITH PROPOFOL;  Surgeon: Jonathon Bellows, MD;  Location: [facility] ENDOSCOPY;  Service: Gastroenterology;  Laterality: N/A;   COLONOSCOPY WITH PROPOFOL N/A 10/06/2021   Procedure: COLONOSCOPY WITH PROPOFOL;  Surgeon: Jonathon Bellows, MD;  Location: [facility] ENDOSCOPY;  Service: Gastroenterology;  Laterality: N/A;   CORONARY ANGIOPLASTY WITH STENT PLACEMENT     7 stents   CORONARY ARTERY BYPASS GRAFT N/A 05/24/2016   Procedure: CORONARY ARTERY BYPASS GRAFTING (CABG) x four , using left internal mammary artery and left leg greater saphenous vein harvested endoscopically;  Surgeon: Gaye Pollack, MD;  Location: [place];  Service: Open Heart Surgery;  Laterality: N/A;   EP IMPLANTABLE DEVICE N/A 06/16/2015   Procedure: ICD Generator Changeout;  Surgeon: Deboraha Sprang, MD;  Location: [place] [facility];  Service: Cardiovascular;  Laterality: N/A;   ESOPHAGOGASTRODUODENOSCOPY     INSERT / REPLACE / REMOVE PACEMAKER     KNEE SURGERY     bilateral knee    [name] WITHOUT CARDIOVERSION N/A 05/24/2016   Procedure: TRANSESOPHAGEAL ECHOCARDIOGRAM ([name]);  Surgeon: Gaye Pollack, MD;  Location: [place];  Service: Open Heart Surgery;  Laterality: N/A;   VASECTOMY      Current Outpatient Medications  Medication Sig Dispense Refill   aspirin EC 81 MG tablet Take 81 mg by mouth daily.  90 tablet    clopidogrel (PLAVIX) 75 MG tablet Take 1 tablet (75 mg total) by mouth daily. 90 tablet 0   DULoxetine (CYMBALTA) 20 MG capsule Take 1 capsule (20 mg total) by mouth daily. 30 capsule 1   escitalopram (LEXAPRO) 20 MG tablet TAKE 1 TABLET BY MOUTH DAILY FOR ANXIETY AND DEPRESSION. GENERIC EQUIVALENT FOR LEXAPRO 90 tablet 2   ezetimibe (ZETIA) 10 MG tablet Take 1 tablet (10 mg total) by mouth daily. For cholesterol. 90 tablet 0   furosemide (LASIX) 40 MG tablet Take 80 mg by mouth daily.     gabapentin (NEURONTIN) 600 MG tablet TAKE TWO TABLETS BY MOUTH TWICE A DAY FOR  PAIN 360 tablet 2   gemfibrozil (LOPID) 600 MG tablet TAKE 1 TABLET( '600MG'$  TOTAL) BY MOUTH DAILY FOR CHOLESTEROL Strength: 600 mg 90 tablet 1   insulin regular human CONCENTRATED (HUMULIN R) 500 UNIT/ML injection Inject 0-25 Units into the skin daily at 12 noon.     methocarbamol (ROBAXIN) 750 MG tablet Take 1 tablet (750 mg total) by mouth every 8 (eight) hours as needed for muscle spasms. 90 tablet 5   metoprolol succinate (TOPROL-XL) 100 MG 24  hr tablet Take 150 mg by mouth daily.     nitroGLYCERIN (NITROSTAT) 0.4 MG SL tablet Place 1 tablet (0.4 mg total) under the tongue every 5 (five) minutes as needed for chest pain. 25 tablet 1   omega-3 acid ethyl esters (LOVAZA) 1 g capsule Take 1 g by mouth 2 (two) times daily.     rosuvastatin (CRESTOR) 20 MG tablet TAKE 1 TABLET BY MOUTH AT BEDTIME FOR CHOLESTEROL 90 tablet 2   sacubitril-valsartan (ENTRESTO) 49-51 MG Take 1 tablet by mouth 2 (two) times daily.     Semaglutide, 2 MG/DOSE, (OZEMPIC, 2 MG/DOSE,) 8 MG/3ML SOPN Inject 2 mg into the skin every Friday.     spironolactone (ALDACTONE) 25 MG tablet Take 25 mg by mouth daily.     traZODone (DESYREL) 100 MG tablet TAKE 2 TABLETS BY MOUTH  EVERY NIGHT AT BEDTIME for sleep. 180 tablet 3   No current facility-administered medications for this visit.    Allergies  Allergen Reactions   Naproxen Other (See Comments), Anxiety and Rash    Causes hyperactivity causes hyperactivity   Morphine And Related Nausea And Vomiting    Morphine only      Review of Systems negative except from HPI and PMH  Physical Exam     BP (!) 100/54 (BP Location: Left Arm, Patient Position: Sitting, Cuff Size: Large)   Pulse 72   Ht 6' (1.829 m)   Wt 268 lb (121.6 kg)   SpO2 98%   BMI 36.35 kg/m  Well developed and Morbidly obese in no acute distress HENT normal Neck supple with JVP-flat Clear Device pocket well healed; without hematoma or erythema.  There is no tethering  Regular rate and rhythm, no  gallop No  murmur Abd-soft with active BS No Clubbing cyanosis  edema Skin-warm and dry A & Oriented  Grossly normal sensory and motor function  ECG sinus at 72 Intervals 26/12/38 Axis right 96 Isolated PVC Nonspecific ST-T changes Unchanged from 9/22    Assessment and  Plan  Ischemic cardiomyopathy  Implantable defibrillator-[employer]    History of inappropriate therapy  Tobacco use>>stopped!!!  Congestive heart failure-chronic-systolic  PVCs     Anemia   Without angina.  Continue DAPT and metoprolol.  Blood pressure well controlled.  Continue Entresto and spironolactone.  Not on an SGLT2 but will defer to primary/diabetic care team  Anemic.  Longstanding.  We will check reticulocyte count and iron labs.  Continue gemfibrozil and rosuvastatin.

## 2022-05-23 ENCOUNTER — Other Ambulatory Visit: Payer: Self-pay | Admitting: Internal Medicine

## 2022-05-23 DIAGNOSIS — D649 Anemia, unspecified: Secondary | ICD-10-CM | POA: Diagnosis not present

## 2022-05-24 LAB — FERRITIN: Ferritin: 171 ng/mL (ref 30–400)

## 2022-05-24 LAB — IRON AND TIBC
Iron Saturation: 20 % (ref 15–55)
Iron: 76 ug/dL (ref 38–169)
Total Iron Binding Capacity: 382 ug/dL (ref 250–450)
UIBC: 306 ug/dL (ref 111–343)

## 2022-05-24 LAB — RETICULOCYTES: Retic Ct Pct: 1.7 % (ref 0.6–2.6)

## 2022-05-28 DIAGNOSIS — R200 Anesthesia of skin: Secondary | ICD-10-CM | POA: Diagnosis not present

## 2022-05-28 DIAGNOSIS — G5622 Lesion of ulnar nerve, left upper limb: Secondary | ICD-10-CM | POA: Diagnosis not present

## 2022-06-04 ENCOUNTER — Other Ambulatory Visit: Payer: Self-pay | Admitting: Student in an Organized Health Care Education/Training Program

## 2022-06-04 DIAGNOSIS — E1142 Type 2 diabetes mellitus with diabetic polyneuropathy: Secondary | ICD-10-CM

## 2022-06-04 DIAGNOSIS — M1712 Unilateral primary osteoarthritis, left knee: Secondary | ICD-10-CM | POA: Diagnosis not present

## 2022-06-04 DIAGNOSIS — G5623 Lesion of ulnar nerve, bilateral upper limbs: Secondary | ICD-10-CM

## 2022-06-04 DIAGNOSIS — M1711 Unilateral primary osteoarthritis, right knee: Secondary | ICD-10-CM | POA: Diagnosis not present

## 2022-06-05 ENCOUNTER — Telehealth: Payer: Self-pay

## 2022-06-05 NOTE — Telephone Encounter (Signed)
LM for patient to call office for pre virtual appointment questions.  

## 2022-06-05 NOTE — Progress Notes (Signed)
Remote ICD transmission.   

## 2022-06-06 ENCOUNTER — Ambulatory Visit
Payer: Medicare Other | Attending: Student in an Organized Health Care Education/Training Program | Admitting: Student in an Organized Health Care Education/Training Program

## 2022-06-06 ENCOUNTER — Encounter: Payer: Self-pay | Admitting: Student in an Organized Health Care Education/Training Program

## 2022-06-06 DIAGNOSIS — G5623 Lesion of ulnar nerve, bilateral upper limbs: Secondary | ICD-10-CM

## 2022-06-06 DIAGNOSIS — G894 Chronic pain syndrome: Secondary | ICD-10-CM | POA: Diagnosis not present

## 2022-06-06 DIAGNOSIS — E1142 Type 2 diabetes mellitus with diabetic polyneuropathy: Secondary | ICD-10-CM | POA: Diagnosis not present

## 2022-06-06 MED ORDER — DULOXETINE HCL 20 MG PO CPEP: 40.0000 mg | ORAL_CAPSULE | Freq: Every day | ORAL | 5 refills | Status: DC

## 2022-06-06 NOTE — Progress Notes (Signed)
Patient: Brian Williams  Service Category: E/M  Provider: Gillis Santa, MD  DOB: 10/07/1960  DOS: 06/06/2022  Location: Office  MRN: [id number]  Setting: Ambulatory outpatient  Referring Provider: Pleas Koch, NP  Type: Established Patient  Specialty: Interventional Pain Management  PCP: Pleas Koch, NP  Location: Remote location  Delivery: TeleHealth     Virtual Encounter - Pain Management PROVIDER NOTE: Information contained herein reflects review and annotations entered in association with encounter. Interpretation of such information and data should be left to medically-trained personnel. Information provided to patient can be located elsewhere in the medical record under "Patient Instructions". Document created using STT-dictation technology, any transcriptional errors that may result from process are unintentional.    Contact & Pharmacy Preferred: [phone] Home: [phone] (home) Mobile: [phone] (mobile) E-mail: [email or web address]  [facility] [place], [address] AT [facility] [address] [place] [address] Phone: [phone] Fax: [phone]  [facility] [place], [place] [address] Phone: [phone] Fax: [phone]   Pre-screening  Brian Williams offered "in-person" vs "virtual" encounter. He indicated preferring virtual for this encounter.   Reason COVID-19*  Social distancing based on CDC and AMA recommendations.   I contacted Brian Williams on 06/06/2022 via telephone.      I clearly identified myself as Gillis Santa, MD. I verified that I was speaking with the correct person using two identifiers (Name: Brian Williams, and date of birth: 1961/03/21).  Consent I sought verbal advanced consent from Brian Williams for virtual visit interactions. I informed Brian Williams of possible security and privacy concerns,  risks, and limitations associated with providing "not-in-person" medical evaluation and management services. I also informed Brian Williams of the availability of "in-person" appointments. Finally, I informed him that there would be a charge for the virtual visit and that he could be  personally, fully or partially, financially responsible for it. Brian Williams expressed understanding and agreed to proceed.   Historic Elements   Brian Williams is a 61 y.o. year old, male patient evaluated today after our last contact on 06/04/2022. Brian Williams  has a past medical history of Acute appendicitis (04/03/2021), Acute pyelonephritis (09/07/2021), AICD (automatic cardioverter/defibrillator) present, Allergy, Anxiety, Appendicitis, Arthritis, Back pain, Bacteremia due to Klebsiella pneumoniae (09/07/2021), Cardiomyopathy ([place]), CHF (congestive heart failure) ([place]), Coronary artery disease, Depression, Diabetes mellitus without complication ([place]), GERD (gastroesophageal reflux disease), Headache, History of bronchitis, History of colon polyps, History of hiatal hernia, Hyperlipidemia, Hypertension, MI (myocardial infarction) ([place]) (2001), Multifocal pneumonia (09/07/2021), Obesity, Peripheral neuropathy, Pneumonia, PONV (postoperative nausea and vomiting), Presence of permanent cardiac pacemaker, Sepsis ([place]) (09/06/2021), Shortness of breath dyspnea, Sleep apnea, and Ventricular tachycardia ([place]). He also  has a past surgical history that includes Cardiac electrophysiology study and ablation; Cardiac catheterization; Knee surgery; Back surgery; Cardiac defibrillator placement (10/16/2007); Insert / replace / remove pacemaker; Cardiac catheterization (N/A, 06/16/2015); Cholecystectomy (2001); Cardiac catheterization (Left, 05/10/2016); Coronary angioplasty with stent; Colonoscopy; Esophagogastroduodenoscopy; Vasectomy; Coronary artery bypass graft (N/A, 05/24/2016); [name] without cardioversion (N/A, 05/24/2016); Colonoscopy with  propofol (N/A, 08/28/2018); Appendectomy; and Colonoscopy with propofol (N/A, 10/06/2021). Brian Williams has a current medication list which includes the following prescription(s): aspirin ec, clopidogrel, escitalopram, ezetimibe, furosemide, gabapentin, gemfibrozil, humulin r, methocarbamol, metoprolol succinate, nitroglycerin, omega-3 acid ethyl esters, rosuvastatin, entresto, ozempic (2 mg/dose), spironolactone, trazodone, and duloxetine. He  reports that he  quit smoking about 4 years ago. His smoking use included cigarettes. He has a 25.00 pack-year smoking history. He has never used smokeless tobacco. He reports that he does not drink alcohol and does not use drugs. Brian Williams is allergic to naproxen and morphine and related.   HPI  Today, he is being contacted for a post-procedure assessment.   Post-procedure evaluation    Procedure:           Qutenza for painful diabetic polyneuropathy    1. Diabetic polyneuropathy associated with type 2 diabetes mellitus ([place])   2. Ulnar neuropathy of both upper extremities     NAS-11 Pain score:   Pre-procedure: 8 /10   Post-procedure: 8 /10      Effectiveness:  Initial hour after procedure: 100 %  Subsequent 4-6 hours post-procedure: 100 %  Analgesia past initial 6 hours: 50 % (current)  Ongoing improvement:  Analgesic:  50%    Laboratory Chemistry Profile   Renal Lab Results  Component Value Date   BUN 23 (H) 09/09/2021   CREATININE 0.70 09/09/2021   BCR 23 (H) 04/05/2020   GFR 97.36 07/13/2021   GFRAA 111 04/05/2020   GFRNONAA >60 09/09/2021    Hepatic Lab Results  Component Value Date   AST 25 09/06/2021   ALT 21 09/06/2021   ALBUMIN 3.6 09/06/2021   ALKPHOS 45 09/06/2021   HCVAB NEGATIVE 10/06/2015   LIPASE 25 04/03/2021    Electrolytes Lab Results  Component Value Date   NA 130 (L) 09/09/2021   K 3.6 09/09/2021   CL 102 09/09/2021   CALCIUM 8.4 (L) 09/09/2021   MG 2.3 09/09/2021   PHOS 2.5 09/08/2021    Bone No  results found for: "VD25OH", "VD125OH2TOT", "YI5027XA1", "OI7867EH2", "25OHVITD1", "25OHVITD2", "09OBSJGG8", "TESTOFREE", "TESTOSTERONE"  Inflammation (CRP: Acute Phase) (ESR: Chronic Phase) Lab Results  Component Value Date   CRP 1.1 (H) 12/07/2020   ESRSEDRATE 106 (H) 09/06/2021   LATICACIDVEN 1.6 09/06/2021         Note: Above Lab results reviewed.  Imaging  CUP PACEART REMOTE DEVICE CHECK Scheduled remote reviewed. Normal device function.   1 RA noise reversion (brief RA oversensing as seen in prior reports). Lead trends stable  Next remote 91 days- JJB  Assessment  The primary encounter diagnosis was Diabetic polyneuropathy associated with type 2 diabetes mellitus ([place]). Diagnoses of Chronic pain syndrome and Ulnar neuropathy of both upper extremities were also pertinent to this visit.  Plan of Care   1. Diabetic polyneuropathy associated with type 2 diabetes mellitus (HCC) - DULoxetine (CYMBALTA) 20 MG capsule; Take 2 capsules (40 mg total) by mouth daily.  Dispense: 60 capsule; Refill: 5 - NEUROLYSIS; Future  2. Chronic pain syndrome - NEUROLYSIS; Future  3. Ulnar neuropathy of both upper extremities - DULoxetine (CYMBALTA) 20 MG capsule; Take 2 capsules (40 mg total) by mouth daily.  Dispense: 60 capsule; Refill: 5 - NEUROLYSIS; Future  Brian Williams is finding benefit with Cymbalta for his ulnar neuropathy.  Recommend dose escalation to 40 mg daily.  He is not noticing any side effects with his current dose.  He also finds benefit with Qutenza for painful diabetic neuropathy of lower extremity.  He would like to continue this every 3 months.  Order placed below.  Pharmacotherapy (Medications Ordered): Meds ordered this encounter  Medications   DULoxetine (CYMBALTA) 20 MG capsule    Sig: Take 2 capsules (40 mg total) by mouth daily.    Dispense:  60 capsule  Refill:  5   Orders:  Orders Placed This Encounter  Procedures   NEUROLYSIS    Please order Qutenza patches  from pharmacy    Standing Status:   Future    Standing Expiration Date:   09/05/2022    Order Specific Question:   Where will this procedure be performed?    Answer:   [facility]   Follow-up plan:   Return in about 1 month (around 07/09/2022) for [place] .    Recent Visits Date Type Provider Dept  04/11/22 Procedure visit Gillis Santa, MD [facility]  Showing recent visits within past 90 days and meeting all other requirements Today's Visits Date Type Provider Dept  06/06/22 Office Visit Gillis Santa, MD [facility]  Showing today's visits and meeting all other requirements Future Appointments No visits were found meeting these conditions. Showing future appointments within next 90 days and meeting all other requirements  I discussed the assessment and treatment plan with the patient. The patient was provided an opportunity to ask questions and all were answered. The patient agreed with the plan and demonstrated an understanding of the instructions.  Patient advised to call back or seek an in-person evaluation if the symptoms or condition worsens.  Duration of encounter: 72mnutes.  Note by: BGillis Santa MD Date: 06/06/2022; Time: 3:40 PM

## 2022-06-18 ENCOUNTER — Encounter: Payer: Self-pay | Admitting: Family

## 2022-06-18 ENCOUNTER — Ambulatory Visit (INDEPENDENT_AMBULATORY_CARE_PROVIDER_SITE_OTHER)
Admission: RE | Admit: 2022-06-18 | Discharge: 2022-06-18 | Disposition: A | Discharge status: Home / Self Care | Payer: Medicare Other | Source: Ambulatory Visit | Attending: Family | Admitting: Family

## 2022-06-18 ENCOUNTER — Ambulatory Visit (INDEPENDENT_AMBULATORY_CARE_PROVIDER_SITE_OTHER): Payer: Medicare Other | Admitting: Family

## 2022-06-18 VITALS — BP 140/78 | HR 83 | Temp 98.6°F | Resp 16 | Ht 72.0 in | Wt 265.1 lb

## 2022-06-18 DIAGNOSIS — Z20822 Contact with and (suspected) exposure to covid-19: Secondary | ICD-10-CM | POA: Insufficient documentation

## 2022-06-18 DIAGNOSIS — R062 Wheezing: Secondary | ICD-10-CM

## 2022-06-18 DIAGNOSIS — J40 Bronchitis, not specified as acute or chronic: Secondary | ICD-10-CM

## 2022-06-18 LAB — POC COVID19 BINAXNOW: SARS Coronavirus 2 Ag: NEGATIVE

## 2022-06-18 MED ORDER — AMOXICILLIN-POT CLAVULANATE 875-125 MG PO TABS: 1.0000 | ORAL_TABLET | Freq: Two times a day (BID) | ORAL | 0 refills | Status: DC

## 2022-06-18 MED ORDER — BENZONATATE 200 MG PO CAPS: 200.0000 mg | ORAL_CAPSULE | Freq: Two times a day (BID) | ORAL | 0 refills | Status: DC | PRN

## 2022-06-18 NOTE — Progress Notes (Signed)
Established Patient Office Visit  Subjective:  Patient ID: Brian Williams, male    DOB: [birth year]  Age: 61 y.o. MRN: [id number]  CC:  Chief Complaint  Patient presents with  . Cough    X 5 days coughing so bad that abdominal hurts  X COVID neg Thursday    HPI Brian Williams is here today with concerns.   Five days ago with unproductive cough, very little sputum production and chest congestion.  He doesn't think he is wheezing.  Also with sinus headache , no sore throat, left ear with some pain and fullness.  No fever or chills.   Taking otc robitussin.  Recently came back from a cruise ship.   Past Medical History:  Diagnosis Date  . Acute appendicitis 04/03/2021  . Acute pyelonephritis 09/07/2021  . AICD (automatic cardioverter/defibrillator) present   . Allergy   . Anxiety    takes Xanax as needed  . Appendicitis   . Arthritis    back,knees,right shoulder  . Back pain   . Bacteremia due to Klebsiella pneumoniae 09/07/2021  . Cardiomyopathy ([place])   . CHF (congestive heart failure) (HCC)    takes Lasix and Aldactone daily  . Coronary artery disease    takes Plavix daily  . Depression    takes Zoloft daily  . Diabetes mellitus without complication (HCC)    Humulin R and Farxiga daily.Fasting blood sugar runs 140  . GERD (gastroesophageal reflux disease)   . Headache   . History of bronchitis   . History of colon polyps    benign  . History of hiatal hernia   . Hyperlipidemia    takes Fenofibrate,Crestor, and Zetia daily  . Hypertension    takes Entresto and Coreg daily  . MI (myocardial infarction) ([place]) 2001  . Multifocal pneumonia 09/07/2021  . Obesity   . Peripheral neuropathy   . Pneumonia    history of-last time about 14 yrs ago  . PONV (postoperative nausea and vomiting)    after knee surgery 25 yrs ago b/p stayed elevated for a while  . Presence of permanent cardiac pacemaker   . Sepsis ([place]) 09/06/2021  . Shortness of breath dyspnea    with  exertion  . Sleep apnea    uses CPAP nightly  . Ventricular tachycardia ([place])    s/p RFCA PVCs 2013    Past Surgical History:  Procedure Laterality Date  . APPENDECTOMY    . BACK SURGERY    . CARDIAC CATHETERIZATION    . CARDIAC CATHETERIZATION Left 05/10/2016   Procedure: Left Heart Cath and Coronary Angiography;  Surgeon: Minna Merritts, MD;  Location: [facility];  Service: Cardiovascular;  Laterality: Left;  . CARDIAC DEFIBRILLATOR PLACEMENT  10/16/2007   ICD Model number 2207-36 serial number [id number]  . CARDIAC ELECTROPHYSIOLOGY STUDY AND ABLATION    . CHOLECYSTECTOMY  2001  . COLONOSCOPY    . COLONOSCOPY WITH PROPOFOL N/A 08/28/2018   Procedure: COLONOSCOPY WITH PROPOFOL;  Surgeon: Jonathon Bellows, MD;  [facility];  Service: Gastroenterology;  Laterality: N/A;  . COLONOSCOPY WITH PROPOFOL N/A 10/06/2021   Procedure: COLONOSCOPY WITH PROPOFOL;  Surgeon: Jonathon Bellows, MD;  Location[facility];  Service: Gastroenterology;  Laterality: N/A;  . CORONARY ANGIOPLASTY WITH STENT PLACEMENT     7 stents  . CORONARY ARTERY BYPASS GRAFT N/A 05/24/2016   Procedure: CORONARY ARTERY BYPASS GRAFTING (CABG) x four , using left internal mammary artery and left leg greater saphenous vein harvested endoscopically;  Surgeon: Gaye Pollack, MD;  Location: [facility];  Service: Open Heart Surgery;  Laterality: N/A;  . EP IMPLANTABLE DEVICE N/A 06/16/2015   Procedure: ICD Generator Changeout;  Surgeon: Deboraha Sprang, MD;  Location: [facility];  Service: Cardiovascular;  Laterality: N/A;  . ESOPHAGOGASTRODUODENOSCOPY    . INSERT / REPLACE / REMOVE PACEMAKER    . KNEE SURGERY     bilateral knee   . [name] WITHOUT CARDIOVERSION N/A 05/24/2016   Procedure: TRANSESOPHAGEAL ECHOCARDIOGRAM ([name]);  Surgeon: Gaye Pollack, MD;  Location: [facility];  Service: Open Heart Surgery;  Laterality: N/A;  . VASECTOMY      Family History  Problem Relation Age of Onset  . Heart attack Mother  41  . Hypertension Mother   . Melanoma Mother   . Heart attack Father 87  . Hypertension Father   . Hypertension Maternal Uncle   . Heart disease Maternal Uncle   . Heart disease Maternal Grandmother   . Stroke Maternal Grandmother   . Diabetes Neg Hx     Social History   Socioeconomic History  . Marital status: Married    Spouse name: Aidon Klemens  . Number of children: 2  . Years of education: Not on file  . Highest education level: Not on file  Occupational History  . Not on file  Tobacco Use  . Smoking status: Former    Packs/day: 1.00    Years: 25.00    Total pack years: 25.00    Types: Cigarettes    Quit date: 05/19/2018    Years since quitting: 4.0  . Smokeless tobacco: Never  Vaping Use  . Vaping Use: Never used  Substance and Sexual Activity  . Alcohol use: No    Comment: rare  . Drug use: No  . Sexual activity: Not Currently  Other Topics Concern  . Not on file  Social History Narrative   Married.   Moved from [place].   Disabled.   Social Determinants of Health   Financial Resource Strain: Low Risk  (07/21/2021)   Overall Financial Resource Strain (CARDIA)   . Difficulty of Paying Living Expenses: Not hard at all  Food Insecurity: No Food Insecurity (07/21/2021)   Hunger Vital Sign   . Worried About Charity fundraiser in the Last Year: Never true   . Ran Out of Food in the Last Year: Never true  Transportation Needs: No Transportation Needs (07/21/2021)   PRAPARE - Transportation   . Lack of Transportation (Medical): No   . Lack of Transportation (Non-Medical): No  Physical Activity: Inactive (07/21/2021)   Exercise Vital Sign   . Days of Exercise per Week: 0 days   . Minutes of Exercise per Session: 0 min  Stress: No Stress Concern Present (07/21/2021)   [place]   . Feeling of Stress : Not at all  Social Connections: Socially Isolated (07/21/2021)   Social Connection and  Isolation Panel [NHANES]   . Frequency of Communication with Friends and Family: Never   . Frequency of Social Gatherings with Friends and Family: Never   . Attends Religious Services: Never   . Active Member of Clubs or Organizations: No   . Attends Archivist Meetings: Never   . Marital Status: Married  Human resources officer Violence: Not At Risk (07/21/2021)   Humiliation, Afraid, Rape, and Kick questionnaire   . Fear of Current or Ex-Partner: No   . Emotionally Abused:  No   . Physically Abused: No   . Sexually Abused: No    Outpatient Medications Prior to Visit  Medication Sig Dispense Refill  . aspirin EC 81 MG tablet Take 81 mg by mouth daily.  90 tablet   . clopidogrel (PLAVIX) 75 MG tablet Take 1 tablet (75 mg total) by mouth daily. 90 tablet 0  . DULoxetine (CYMBALTA) 20 MG capsule Take 2 capsules (40 mg total) by mouth daily. 60 capsule 5  . escitalopram (LEXAPRO) 20 MG tablet TAKE 1 TABLET BY MOUTH DAILY FOR ANXIETY AND DEPRESSION. GENERIC EQUIVALENT FOR LEXAPRO 90 tablet 2  . ezetimibe (ZETIA) 10 MG tablet Take 1 tablet (10 mg total) by mouth daily. For cholesterol. 90 tablet 0  . furosemide (LASIX) 40 MG tablet Take 80 mg by mouth daily.    Marland Kitchen gabapentin (NEURONTIN) 600 MG tablet TAKE TWO TABLETS BY MOUTH TWICE A DAY FOR PAIN 360 tablet 2  . gemfibrozil (LOPID) 600 MG tablet TAKE 1 TABLET( '600MG'$  TOTAL) BY MOUTH DAILY FOR CHOLESTEROL Strength: 600 mg 90 tablet 1  . insulin regular human CONCENTRATED (HUMULIN R) 500 UNIT/ML injection Inject 0-25 Units into the skin daily at 12 noon.    . methocarbamol (ROBAXIN) 750 MG tablet Take 1 tablet (750 mg total) by mouth every 8 (eight) hours as needed for muscle spasms. 90 tablet 5  . metoprolol succinate (TOPROL-XL) 100 MG 24 hr tablet Take 150 mg by mouth daily.    . nitroGLYCERIN (NITROSTAT) 0.4 MG SL tablet Place 1 tablet (0.4 mg total) under the tongue every 5 (five) minutes as needed for chest pain. 25 tablet 1  . omega-3  acid ethyl esters (LOVAZA) 1 g capsule Take 1 g by mouth 2 (two) times daily.    . rosuvastatin (CRESTOR) 20 MG tablet TAKE 1 TABLET BY MOUTH AT BEDTIME FOR CHOLESTEROL 90 tablet 2  . sacubitril-valsartan (ENTRESTO) 49-51 MG Take 1 tablet by mouth 2 (two) times daily.    . Semaglutide, 2 MG/DOSE, (OZEMPIC, 2 MG/DOSE,) 8 MG/3ML SOPN Inject 2 mg into the skin every Friday.    Marland Kitchen spironolactone (ALDACTONE) 25 MG tablet Take 25 mg by mouth daily.    . traZODone (DESYREL) 100 MG tablet TAKE 2 TABLETS BY MOUTH  EVERY NIGHT AT BEDTIME for sleep. 180 tablet 3   No facility-administered medications prior to visit.    Allergies  Allergen Reactions  . Naproxen Other (See Comments), Anxiety and Rash    Causes hyperactivity causes hyperactivity  . Morphine And Related Nausea And Vomiting    Morphine only        Objective:    Physical Exam Constitutional:      General: He is awake. He is not in acute distress.    Appearance: Normal appearance. He is obese. He is not ill-appearing.  HENT:     Right Ear: Tympanic membrane normal.     Left Ear: Tympanic membrane normal.     Nose: Nose normal.     Right Turbinates: Not enlarged or swollen.     Left Turbinates: Not enlarged or swollen.     Right Sinus: No maxillary sinus tenderness or frontal sinus tenderness.     Left Sinus: No maxillary sinus tenderness or frontal sinus tenderness.     Mouth/Throat:     Mouth: Mucous membranes are moist.     Pharynx: No pharyngeal swelling, oropharyngeal exudate or posterior oropharyngeal erythema.  Eyes:     Extraocular Movements: Extraocular movements intact.  Pupils: Pupils are equal, round, and reactive to light.  Cardiovascular:     Rate and Rhythm: Normal rate and regular rhythm.  Pulmonary:     Effort: Pulmonary effort is normal.     Breath sounds: Examination of the right-upper field reveals wheezing. Examination of the right-lower field reveals wheezing. Examination of the left-lower field  reveals wheezing. Wheezing present. No rhonchi or rales.  Neurological:     Mental Status: He is alert.    BP (!) 140/78   Pulse 83   Temp 98.6 F (37 C)   Resp 16   Ht 6' (1.829 m)   Wt 265 lb 2 oz (120.3 kg)   SpO2 98%   BMI 35.96 kg/m  Wt Readings from Last 3 Encounters:  06/18/22 265 lb 2 oz (120.3 kg)  05/22/22 268 lb (121.6 kg)  04/11/22 260 lb (117.9 kg)     Health Maintenance Due  Topic Date Due  . FOOT EXAM  02/03/2021  . Diabetic kidney evaluation - Urine ACR  06/24/2021  . COVID-19 Vaccine (5 - Moderna risk series) 08/31/2021  . HEMOGLOBIN A1C  03/08/2022  . INFLUENZA VACCINE  04/17/2022    There are no preventive care reminders to display for this patient.  Lab Results  Component Value Date   TSH 1.751 09/07/2021   Lab Results  Component Value Date   WBC 8.8 12/13/2021   HGB 12.2 (L) 12/13/2021   HCT 35.5 (L) 12/13/2021   MCV 94.9 12/13/2021   PLT 191.0 12/13/2021   Lab Results  Component Value Date   NA 130 (L) 09/09/2021   K 3.6 09/09/2021   CO2 23 09/09/2021   GLUCOSE 206 (H) 09/09/2021   BUN 23 (H) 09/09/2021   CREATININE 0.70 09/09/2021   BILITOT 0.8 09/06/2021   ALKPHOS 45 09/06/2021   AST 25 09/06/2021   ALT 21 09/06/2021   PROT 7.2 09/06/2021   ALBUMIN 3.6 09/06/2021   CALCIUM 8.4 (L) 09/09/2021   ANIONGAP 5 09/09/2021   GFR 97.36 07/13/2021   Lab Results  Component Value Date   HGBA1C 7.6 (H) 09/07/2021      Assessment & Plan:   Problem List Items Addressed This Visit       Respiratory   Bronchitis    rx augmentin 875/125 mg po bid x 10 days Chest xray today to r/o pneumonia  covid test today in office for suspected exposure  If positive pneumonia will add doxycycline x 10 days bid  Tessalon perrles rx sent in for 200 mg for cough prn  Continue to use mucinex as needed Increase oral fluids. Pt to f/u if sx worsen and or fail to improve in 2-3 days.       Relevant Medications   amoxicillin-clavulanate  (AUGMENTIN) 875-125 MG tablet   benzonatate (TESSALON) 200 MG capsule   Other Relevant Orders   DG Chest 2 View     Other   RESOLVED: Suspected COVID-19 virus infection - Primary   Relevant Medications   benzonatate (TESSALON) 200 MG capsule   Other Relevant Orders   POC COVID-19 BinaxNow   Other Visit Diagnoses     Wheezing       Relevant Medications   benzonatate (TESSALON) 200 MG capsule   Other Relevant Orders   DG Chest 2 View       Meds ordered this encounter  Medications  . amoxicillin-clavulanate (AUGMENTIN) 875-125 MG tablet    Sig: Take 1 tablet by mouth 2 (two) times daily.  Dispense:  20 tablet    Refill:  0    Order Specific Question:   Supervising Provider    Answer:   BEDSOLE, AMY E [[id number]]  . benzonatate (TESSALON) 200 MG capsule    Sig: Take 1 capsule (200 mg total) by mouth 2 (two) times daily as needed for cough.    Dispense:  20 capsule    Refill:  0    Order Specific Question:   Supervising Provider    Answer:   BEDSOLE, AMY E [[id number]]    Follow-up: No follow-ups on file.    Eugenia Pancoast, FNP

## 2022-06-18 NOTE — Assessment & Plan Note (Signed)
rx augmentin 875/125 mg po bid x 10 days Chest xray today to r/o pneumonia  covid test today in office for suspected exposure  If positive pneumonia will add doxycycline x 10 days bid  Tessalon perrles rx sent in for 200 mg for cough prn  Continue to use mucinex as needed Increase oral fluids. Pt to f/u if sx worsen and or fail to improve in 2-3 days.

## 2022-06-18 NOTE — Telephone Encounter (Signed)
Spoke with patient and scheduled office visit 10/2 @ 12:20.

## 2022-06-19 ENCOUNTER — Encounter: Payer: Self-pay | Admitting: Family

## 2022-06-19 ENCOUNTER — Telehealth: Payer: Self-pay

## 2022-06-19 DIAGNOSIS — M545 Low back pain, unspecified: Secondary | ICD-10-CM

## 2022-06-19 NOTE — Telephone Encounter (Signed)
Pt called asking for the results for his xrays done on yesterday, 06/18/22. Pt also stated the meds he was prescribed for his cough, benzonatate (TESSALON) 200 MG capsule, he states its not working. Pt wanted to know could he take any over the counter cough syrups to help? Call back # [phone]

## 2022-06-19 NOTE — Telephone Encounter (Signed)
According to his chart, I sent a 9 month supply of his gabapentin to [facility] pharmacy in June. He should have refills on file through his pharmacy.

## 2022-06-20 NOTE — Telephone Encounter (Signed)
He is calling about the Tessalon not working. I will inform him about the gabapentin.

## 2022-06-20 NOTE — Telephone Encounter (Signed)
Brian Williams if you look below [facility] also inquired for a gabapentin refill.  As far as the cough medication I did address this in mychart, however, please advise pt we are limited due to him taking plavix as they would interact (the cough medications that are prescribed) I would recommend robitussin otc

## 2022-06-22 MED ORDER — GABAPENTIN 600 MG PO TABS: ORAL_TABLET | ORAL | 1 refills | Status: DC

## 2022-06-22 NOTE — Telephone Encounter (Signed)
Please have patient to tell [facility] to discontinue the gabapentin prescription from their end.  The only reason we sent it to [facility] is because we received a refill request.  I will send to his mail order pharmacy now.

## 2022-06-22 NOTE — Telephone Encounter (Signed)
Called pt and informed about the Robitussin and also informed him about his Gabepentin he said that it should have been sent to [facility] not [facility].

## 2022-06-22 NOTE — Addendum Note (Signed)
Addended by: Pleas Koch on: 06/22/2022 05:27 PM   Modules accepted: Orders

## 2022-06-25 ENCOUNTER — Other Ambulatory Visit: Payer: Self-pay | Admitting: Family

## 2022-06-25 ENCOUNTER — Telehealth: Payer: Self-pay | Admitting: Primary Care

## 2022-06-25 DIAGNOSIS — R062 Wheezing: Secondary | ICD-10-CM

## 2022-06-25 DIAGNOSIS — J40 Bronchitis, not specified as acute or chronic: Secondary | ICD-10-CM

## 2022-06-25 MED ORDER — DOXYCYCLINE HYCLATE 100 MG PO TABS: 100.0000 mg | ORAL_TABLET | Freq: Two times a day (BID) | ORAL | 0 refills | Status: AC

## 2022-06-25 MED ORDER — PREDNISONE 20 MG PO TABS: ORAL_TABLET | ORAL | 0 refills | Status: DC

## 2022-06-25 NOTE — Progress Notes (Unsigned)
Sent in new antbx Start doxycycline 100 mg twice daily for ten days Also sending in prednisone is pt does ok with this.  have pt get on schedule for K. Clark in one week to f/u in office.

## 2022-06-25 NOTE — Telephone Encounter (Signed)
Patient called in and stated that he was seen last week and isn't feeling much better. He was wondering if a stronger antibiotic could be called in form him. Thank you!

## 2022-06-25 NOTE — Telephone Encounter (Signed)
Patient states he is not feeling any better from last week. He has been taking robitussin constantly for the past week as well as the antibiotic. He is still really congested and mucus is still the same green/yellow color. He is requesting a stronger antibiotic to be sent in for him. Please advise.  Brian Williams- His wife is now sick with the exact same symptoms as him and has an appointment today.

## 2022-06-25 NOTE — Progress Notes (Signed)
Called and informed pt about medication. Also scheduled him a fu with Gentry Fitz on 07/04/2022

## 2022-06-25 NOTE — Telephone Encounter (Signed)
Patient notified to discontinue gabapentin at [facility].

## 2022-07-02 DIAGNOSIS — G5622 Lesion of ulnar nerve, left upper limb: Secondary | ICD-10-CM | POA: Diagnosis not present

## 2022-07-04 ENCOUNTER — Ambulatory Visit: Payer: Medicare Other | Admitting: Primary Care

## 2022-07-09 ENCOUNTER — Encounter: Payer: Self-pay | Admitting: Student in an Organized Health Care Education/Training Program

## 2022-07-09 ENCOUNTER — Ambulatory Visit
Payer: Medicare Other | Attending: Student in an Organized Health Care Education/Training Program | Admitting: Student in an Organized Health Care Education/Training Program

## 2022-07-09 VITALS — BP 120/69 | HR 73 | Temp 97.0°F | Resp 17 | Ht 72.0 in | Wt 260.0 lb

## 2022-07-09 DIAGNOSIS — E1142 Type 2 diabetes mellitus with diabetic polyneuropathy: Secondary | ICD-10-CM | POA: Diagnosis not present

## 2022-07-09 DIAGNOSIS — G894 Chronic pain syndrome: Secondary | ICD-10-CM | POA: Diagnosis not present

## 2022-07-09 MED ORDER — CAPSAICIN-CLEANSING GEL 8 % EX KIT
4.0000 | PACK | Freq: Once | CUTANEOUS | Status: AC
  Administered 2022-07-09: 4 via TOPICAL

## 2022-07-09 NOTE — Progress Notes (Signed)
PROVIDER NOTE: Interpretation of information contained herein should be left to medically-trained personnel. Specific patient instructions are provided elsewhere under "Patient Instructions" section of medical record. This document was created in part using STT-dictation technology, any transcriptional errors that may result from this process are unintentional.  Patient: Brian Williams Type: Established DOB: [birth year] MRN: [id number] PCP: Pleas Koch, NP  Service: Procedure DOS: 07/09/2022 Setting: Ambulatory Location: Ambulatory outpatient facility Delivery: Face-to-face Provider: Gillis Santa, MD Specialty: Interventional Pain Management Specialty designation: 09 Location: Outpatient facility Ref. Prov.: Pleas Koch, NP    Primary Reason for Visit: Interventional Pain Management Treatment. CC: Foot Pain (bilateral)    Procedure:           Qutenza for painful diabetic polyneuropathy #2    1. Diabetic polyneuropathy associated with type 2 diabetes mellitus ([place])   2. Chronic pain syndrome      NAS-11 Pain score:   Pre-procedure: 5 /10   Post-procedure: 5 /10     Pre-op H&P Assessment:  Mr. Kittleson is a 61 y.o. (year old), male patient, seen today for interventional treatment. He  has a past surgical history that includes Cardiac electrophysiology study and ablation; Cardiac catheterization; Knee surgery; Back surgery; Cardiac defibrillator placement (10/16/2007); Insert / replace / remove pacemaker; Cardiac catheterization (N/A, 06/16/2015); Cholecystectomy (2001); Cardiac catheterization (Left, 05/10/2016); Coronary angioplasty with stent; Colonoscopy; Esophagogastroduodenoscopy; Vasectomy; Coronary artery bypass graft (N/A, 05/24/2016); [name] without cardioversion (N/A, 05/24/2016); Colonoscopy with propofol (N/A, 08/28/2018); Appendectomy; and Colonoscopy with propofol (N/A, 10/06/2021). Mr. Celani has a current medication list which includes the following  prescription(s): aspirin ec, clopidogrel, duloxetine, escitalopram, ezetimibe, furosemide, gabapentin, gemfibrozil, humulin r, methocarbamol, metoprolol succinate, nitroglycerin, omega-3 acid ethyl esters, rosuvastatin, entresto, ozempic (2 mg/dose), spironolactone, trazodone, benzonatate, and prednisone. His primarily concern today is the Foot Pain (bilateral)  Initial Vital Signs:  Pulse/HCG Rate:  79   Temp: (!) 97 F (36.1 C) Resp: 14 BP: 113/67 SpO2: 99 %  BMI: Estimated body mass index is 35.26 kg/m as calculated from the following:   Height as of this encounter: 6' (1.829 m).   Weight as of this encounter: 260 lb (117.9 kg).  Risk Assessment: Allergies: Reviewed. He is allergic to naproxen and morphine and related.  Allergy Precautions: None required Coagulopathies: Reviewed. None identified.  Blood-thinner therapy: None at this time Active Infection(s): Reviewed. None identified. Mr. Laneve is afebrile  Site Confirmation: Mr. Seth was asked to confirm the procedure and laterality before marking the site Procedure checklist: Completed Consent: Before the procedure and under the influence of no sedative(s), amnesic(s), or anxiolytics, the patient was informed of the treatment options, risks and possible complications. To fulfill our ethical and legal obligations, as recommended by the American Medical Association's Code of Ethics, I have informed the patient of my clinical impression; the nature and purpose of the treatment or procedure; the risks, benefits, and possible complications of the intervention; the alternatives, including doing nothing; the risk(s) and benefit(s) of the alternative treatment(s) or procedure(s); and the risk(s) and benefit(s) of doing nothing. The patient was provided information about the general risks and possible complications associated with the procedure. These may include, but are not limited to: failure to achieve desired goals, infection,  bleeding, organ or nerve damage, allergic reactions, paralysis, and death. In addition, the patient was informed of those risks and complications associated to the procedure, such as failure to decrease pain; infection; bleeding; organ or nerve damage with subsequent damage to sensory, motor, and/or autonomic systems, resulting  in permanent pain, numbness, and/or weakness of one or several areas of the body; allergic reactions; (i.e.: anaphylactic reaction); and/or death. Furthermore, the patient was informed of those risks and complications associated with the medications. These include, but are not limited to: allergic reactions (i.e.: anaphylactic or anaphylactoid reaction(s)); adrenal axis suppression; blood sugar elevation that in diabetics may result in ketoacidosis or comma; water retention that in patients with history of congestive heart failure may result in shortness of breath, pulmonary edema, and decompensation with resultant heart failure; weight gain; swelling or edema; medication-induced neural toxicity; particulate matter embolism and blood vessel occlusion with resultant organ, and/or nervous system infarction; and/or aseptic necrosis of one or more joints. Finally, the patient was informed that Medicine is not an exact science; therefore, there is also the possibility of unforeseen or unpredictable risks and/or possible complications that may result in a catastrophic outcome. The patient indicated having understood very clearly. We have given the patient no guarantees and we have made no promises. Enough time was given to the patient to ask questions, all of which were answered to the patient's satisfaction. Mr. Grizzell has indicated that he wanted to continue with the procedure. Attestation: I, the ordering provider, attest that I have discussed with the patient the benefits, risks, side-effects, alternatives, likelihood of achieving goals, and potential problems during recovery for the  procedure that I have provided informed consent. Date  Time: 07/09/2022 10:41 AM  Pre-Procedure Preparation:  Monitoring: As per clinic protocol. Respiration, ETCO2, SpO2, BP, heart rate and rhythm monitor placed and checked for adequate function Safety Precautions: Patient was assessed for positional comfort and pressure points before starting the procedure. Time-out: I initiated and conducted the "Time-out" before starting the procedure, as per protocol. The patient was asked to participate by confirming the accuracy of the "Time Out" information. Verification of the correct person, site, and procedure were performed and confirmed by me, the nursing staff, and the patient. "Time-out" conducted as per Joint Commission's Universal Protocol (UP.01.01.01). Time: 1103  Description of Procedure:           Area Prepped: Entire foot Region DuraPrep (Iodine Povacrylex [0.7% available iodine] and Isopropyl Alcohol, 74% w/w)  1 Qutenza patch applied to the plantar surface of the left foot, 1 Qutenza patch applied to the dorsal surface of the left foot. 1 Qutenza patch applied to the plantar surface of the right foot, 1 Qutenza patch applied to the dorsal surface of the right foot.  Coban applied. Treatment duration 45 minutes.                              Vitals:   07/09/22 1054 07/09/22 1214  BP: 113/67 120/69  Pulse: 79 73  Resp: 14 17  Temp: (!) 97 F (36.1 C)   TempSrc: Temporal   SpO2: 99% 99%  Weight: 260 lb (117.9 kg)   Height: 6' (1.829 m)        Post-operative Assessment:  Post-procedure Vital Signs:  Pulse/HCG Rate:  73  Temp:  (!) 97 F (36.1 C) Resp: 17 BP: 120/69 SpO2: 99 %  EBL: None  Complications: No immediate post-treatment complications observed by team, or reported by patient.  Note: The patient tolerated the entire procedure well. A repeat set of vitals were taken after the procedure and the patient was kept under observation following  institutional policy, for this type of procedure. Post-procedural neurological assessment was performed, showing return to baseline,  prior to discharge. The patient was provided with post-procedure discharge instructions, including a section on how to identify potential problems. Should any problems arise concerning this procedure, the patient was given instructions to immediately contact us, at any time, without hesitation. In any case, we plan to contact the patient by telephone for a follow-up status report regarding this interventional procedure.  Comments:  No additional relevant information.  Plan of Care   Patient is also complaining of increased pain along his forearm extending from his elbow to his ring finger and pinky finger secondary to ulnar neuropathy related to diabetes.  He is on high-dose gabapentin.  He has tried alpha lipoic acid in the past without any benefit.  He is also tried lidocaine patches which are not helpful.  He is try topical lidocaine which was not effective.  I discussed low-dose Cymbalta as an adjunct to his gabapentin to assist with his increased neuropathic pain.  He is on Lexapro.  I did caution him on the risk of serotonin syndrome.  If he does have any side effects with starting low-dose Cymbalta, he is instructed to discontinue and let me know.  He has an appointment with orthopedics in the upcoming weeks.  I have discussed a bilateral ulnar nerve block under ultrasound guidance.  He states that he will discuss with Dr. Roland Rack and let me know.  Medications ordered for procedure: Meds ordered this encounter  Medications   capsaicin topical system 8 % patch 4 patch   Medications administered: We administered capsaicin topical system.  See the medical record for exact dosing, route, and time of administration.  Follow-up plan:   Return in about 8 weeks (around 09/03/2022) for Post Procedure Evaluation, virtual.      Recent Visits Date Type Provider Dept   06/06/22 Office Visit Gillis Santa, MD [facility]  04/11/22 Procedure visit Gillis Santa, MD [facility]  Showing recent visits within past 90 days and meeting all other requirements Today's Visits Date Type Provider Dept  07/09/22 Procedure visit Gillis Santa, MD [facility]  Showing today's visits and meeting all other requirements Future Appointments No visits were found meeting these conditions. Showing future appointments within next 90 days and meeting all other requirements  Disposition: Discharge home  Discharge (Date  Time): 07/09/2022; 1214 hrs.   Primary Care Physician: Pleas Koch, NP Location: [facility] Note by: Gillis Santa, MD Date: 07/09/2022; Time: 1:12 PM  Disclaimer:  Medicine is not an exact science. The only guarantee in medicine is that nothing is guaranteed. It is important to note that the decision to proceed with this intervention was based on the information collected from the patient. The Data and conclusions were drawn from the patient's questionnaire, the interview, and the physical examination. Because the information was provided in large part by the patient, it cannot be guaranteed that it has not been purposely or unconsciously manipulated. Every effort has been made to obtain as much relevant data as possible for this evaluation. It is important to note that the conclusions that lead to this procedure are derived in large part from the available data. Always take into account that the treatment will also be dependent on availability of resources and existing treatment guidelines, considered by other Pain Management Practitioners as being common knowledge and practice, at the time of the intervention. For Medico-Legal purposes, it is also important to point out that variation in procedural techniques and pharmacological choices are the acceptable norm.  The indications, contraindications,  technique, and results of the above procedure should only be interpreted and judged by a Board-Certified Interventional Pain Specialist with extensive familiarity and expertise in the same exact procedure and technique.

## 2022-07-10 ENCOUNTER — Telehealth: Payer: Self-pay

## 2022-07-10 NOTE — Telephone Encounter (Signed)
Post procedure phone call.  Patient states he is doing good.  

## 2022-07-16 NOTE — Progress Notes (Unsigned)
Cardiology Office Note  Date:  07/17/2022   ID:  JENNY OMDAHL, DOB 03-28-1961, MRN [id number]  PCP:  Pleas Koch, NP   Chief Complaint  Patient presents with   6 month follow up     Patient c/o having a runny nose and cough for 6 weeks & has shortness of breath with over exertion. Medications reviewed by the patient verbally.     HPI:  Mr. Dibiasio is a pleasant 61 year old gentleman with history of  coronary artery disease, hx of CABG long history of smoking,  diabetes type 2 poorly controlled with  hemoglobin A1c of 9, followed by endocrine/Dr. Gabriel Carina, cardiac catheterization 12 dating back to 2002,  ischemic cardiomyopathy,  echocardiogram March 2016 showing ejection fraction 20-25%,   sustained VT, VT ablation in [place] at [facility] sleep apnea, on CPAP, generator change in 2016, ICD in place,   previously seen by seen by [facility] cardiology heart failure/transplant team, [name].  EF 35% in 2017 cath 06/2019 EF 20 to 25%  who presents for routine follow-up of his coronary artery disease, CABG, September 2017  seen by myself in clinic 5/23 Sinus, cough today for 6 weeks Wife was sick, had to go into the hospital  Denies SOB,  Lab work reviewed A1C higher No recent lipid panel  EKG reviewed personally by myself showing predominantly paced rhythm rate 74 bpm Discussed with Dr. Caryl Comes, EP Appears he is pacing ventricle 26%  Followed by CHF clinic at [facility]  Denies any orthostasis symptoms Interested in having surgery on his left ulnar, will need the cardiac clearance Next year would like to have bilateral knee replacements Has had previous Steroid injections to his knees This caused sugars to run high, A1c to run high in the past  No regular exercise program  Other past medical history reviewed Echocardiogram December 2022 in the hospital  1. Left ventricular ejection fraction, by estimation, is 25 to 30%. The  left ventricle has severely decreased  function. The left ventricle  demonstrates global hypokinesis. Unable to exclude regional wall motion  abnormality. The left ventricular internal   cavity size was moderately dilated. Left ventricular diastolic parameters  are indeterminate.   2. Right ventricular systolic function was not well visualized. Function  appears to be depressed. The right ventricular size is normal.   3. The mitral valve was not well visualized. Mild mitral valve  regurgitation.   4. The aortic valve is normal in structure. Aortic valve regurgitation is  not visualized. No aortic stenosis is present.   Other past medical history reviewed Farxiga/jardiance: yeast infection in groin  Prior hospitalizations reviewed In the ER, admitted 12/08/2020 Acute on chronic diarrhea:  dehydration: Metabolic acidosis GI pathogen positive for norovirus. Rifaximin TID for 14 day, too expensive Changed to bactrim (Lasix had been cut back to 40 daily post d/c)  gabapentin 600 TID by neurology  For neck pain 12/01/2020  Cath at 06/2019 at [facility] . There is Significant 3-vessel coronary artery disease including 60%  stenosis of distal LMCA, complete occlusion of the mLAD & mRCA and 80-90%  stenosis of OM2 & OM3.  2. Patent LIMA-mLAD, VG-rPDA and VG-OM2. VG-D known to be occluded.  3. Normal right and left ventricular filling pressures.  4. Normal cardiac output.   CABG By Dr. Cyndia Bent September 2017 Left internal mammary graft to the LAD SVG to diagonal, SVG to OM, SVG to PDA  cath on 05/10/2016 showing a 70% eccentric distal LM stenosis, 80% proximal LAD in-stent  restenosis, 80% diagonal stenosis, and tandem 90% proximal and mid RCA stenoses with mild in-stent narrowing. The LVEF is visually 35-45%   Previously seen in [place] for his cardiac issues. History dates back to 2002 when he had distal RCA disease. Several catheterizations over the next several years for in-stent restenosis of the distal RCA. Later  cath, stent placed to the LAD. Most recent catheterization appears to be April 2011 showing patent LAD and RCA stent.  PMH:   has a past medical history of Acute appendicitis (04/03/2021), Acute pyelonephritis (09/07/2021), AICD (automatic cardioverter/defibrillator) present, Allergy, Anxiety, Appendicitis, Arthritis, Back pain, Bacteremia due to Klebsiella pneumoniae (09/07/2021), Cardiomyopathy ([place]), CHF (congestive heart failure) ([place]), Coronary artery disease, Depression, Diabetes mellitus without complication ([place]), GERD (gastroesophageal reflux disease), Headache, History of bronchitis, History of colon polyps, History of hiatal hernia, Hyperlipidemia, Hypertension, MI (myocardial infarction) ([place]) (2001), Multifocal pneumonia (09/07/2021), Obesity, Peripheral neuropathy, Pneumonia, PONV (postoperative nausea and vomiting), Presence of permanent cardiac pacemaker, Sepsis ([place]) (09/06/2021), Shortness of breath dyspnea, Sleep apnea, and Ventricular tachycardia ([place]).  PSH:    Past Surgical History:  Procedure Laterality Date   APPENDECTOMY     BACK SURGERY     CARDIAC CATHETERIZATION     CARDIAC CATHETERIZATION Left 05/10/2016   Procedure: Left Heart Cath and Coronary Angiography;  Surgeon: Minna Merritts, MD;  Location: [place] [facility];  Service: Cardiovascular;  Laterality: Left;   CARDIAC DEFIBRILLATOR PLACEMENT  10/16/2007   ICD Model number 2207-36 serial number [id number]   CARDIAC ELECTROPHYSIOLOGY STUDY AND ABLATION     CHOLECYSTECTOMY  2001   COLONOSCOPY     COLONOSCOPY WITH PROPOFOL N/A 08/28/2018   Procedure: COLONOSCOPY WITH PROPOFOL;  Surgeon: Jonathon Bellows, MD;  Location[facility] ENDOSCOPY;  Service: Gastroenterology;  Laterality: N/A;   COLONOSCOPY WITH PROPOFOL N/A 10/06/2021   Procedure: COLONOSCOPY WITH PROPOFOL;  Surgeon: Jonathon Bellows, MD;  Location[facility] ENDOSCOPY;  Service: Gastroenterology;  Laterality: N/A;   CORONARY ANGIOPLASTY WITH STENT PLACEMENT     7 stents    CORONARY ARTERY BYPASS GRAFT N/A 05/24/2016   Procedure: CORONARY ARTERY BYPASS GRAFTING (CABG) x four , using left internal mammary artery and left leg greater saphenous vein harvested endoscopically;  Surgeon: Gaye Pollack, MD;  Location: [place];  Service: Open Heart Surgery;  Laterality: N/A;   EP IMPLANTABLE DEVICE N/A 06/16/2015   Procedure: ICD Generator Changeout;  Surgeon: Deboraha Sprang, MD;  Location: [place] [facility];  Service: Cardiovascular;  Laterality: N/A;   ESOPHAGOGASTRODUODENOSCOPY     INSERT / REPLACE / REMOVE PACEMAKER     KNEE SURGERY     bilateral knee    [name] WITHOUT CARDIOVERSION N/A 05/24/2016   Procedure: TRANSESOPHAGEAL ECHOCARDIOGRAM ([name]);  Surgeon: Gaye Pollack, MD;  Location: [place];  Service: Open Heart Surgery;  Laterality: N/A;   VASECTOMY      Current Outpatient Medications  Medication Sig Dispense Refill   aspirin EC 81 MG tablet Take 81 mg by mouth daily.  90 tablet    clopidogrel (PLAVIX) 75 MG tablet Take 1 tablet (75 mg total) by mouth daily. 90 tablet 0   DULoxetine (CYMBALTA) 20 MG capsule Take 2 capsules (40 mg total) by mouth daily. 60 capsule 5   escitalopram (LEXAPRO) 20 MG tablet TAKE 1 TABLET BY MOUTH DAILY FOR ANXIETY AND DEPRESSION. GENERIC EQUIVALENT FOR LEXAPRO 90 tablet 2   ezetimibe (ZETIA) 10 MG tablet Take 1 tablet (10 mg total) by mouth daily. For cholesterol. 90 tablet 0  furosemide (LASIX) 40 MG tablet Take 80 mg by mouth daily.     gabapentin (NEURONTIN) 600 MG tablet TAKE TWO TABLETS BY MOUTH TWICE A DAY FOR PAIN 360 tablet 1   gemfibrozil (LOPID) 600 MG tablet TAKE 1 TABLET( '600MG'$  TOTAL) BY MOUTH DAILY FOR CHOLESTEROL Strength: 600 mg 90 tablet 1   insulin regular human CONCENTRATED (HUMULIN R) 500 UNIT/ML injection Inject 0-25 Units into the skin daily at 12 noon.     methocarbamol (ROBAXIN) 750 MG tablet Take 1 tablet (750 mg total) by mouth every 8 (eight) hours as needed for muscle spasms. 90 tablet 5   metoprolol  succinate (TOPROL-XL) 100 MG 24 hr tablet Take 150 mg by mouth daily.     nitroGLYCERIN (NITROSTAT) 0.4 MG SL tablet Place 1 tablet (0.4 mg total) under the tongue every 5 (five) minutes as needed for chest pain. 25 tablet 1   omega-3 acid ethyl esters (LOVAZA) 1 g capsule Take 1 g by mouth 2 (two) times daily.     rosuvastatin (CRESTOR) 20 MG tablet TAKE 1 TABLET BY MOUTH AT BEDTIME FOR CHOLESTEROL 90 tablet 2   sacubitril-valsartan (ENTRESTO) 49-51 MG Take 1 tablet by mouth 2 (two) times daily.     Semaglutide, 2 MG/DOSE, (OZEMPIC, 2 MG/DOSE,) 8 MG/3ML SOPN Inject 2 mg into the skin every Friday.     spironolactone (ALDACTONE) 25 MG tablet Take 25 mg by mouth daily.     traZODone (DESYREL) 100 MG tablet TAKE 2 TABLETS BY MOUTH  EVERY NIGHT AT BEDTIME for sleep. 180 tablet 3   benzonatate (TESSALON) 200 MG capsule Take 1 capsule (200 mg total) by mouth 2 (two) times daily as needed for cough. (Patient not taking: Reported on 07/17/2022) 20 capsule 0   predniSONE (DELTASONE) 20 MG tablet Take two tablets po qd for five days (Patient not taking: Reported on 07/17/2022) 10 tablet 0   No current facility-administered medications for this visit.     Allergies:   Naproxen and Morphine and related   Social History:  The patient  reports that he quit smoking about 4 years ago. His smoking use included cigarettes. He has a 25.00 pack-year smoking history. He has never used smokeless tobacco. He reports that he does not drink alcohol and does not use drugs.   Family History:   family history includes Heart attack (age of onset: 36) in his mother; Heart attack (age of onset: 48) in his father; Heart disease in his maternal grandmother and maternal uncle; Hypertension in his father, maternal uncle, and mother; Melanoma in his mother; Stroke in his maternal grandmother.    Review of Systems: Review of Systems  Constitutional: Negative.   HENT: Negative.    Respiratory: Negative.    Cardiovascular:  Negative.   Gastrointestinal: Negative.   Musculoskeletal: Negative.        Leg pain  Neurological: Negative.   Psychiatric/Behavioral: Negative.    All other systems reviewed and are negative.  PHYSICAL EXAM: VS:  BP 100/64 (BP Location: Left Arm, Patient Position: Sitting, Cuff Size: Large)   Pulse 74   Ht 6' (1.829 m)   Wt 273 lb 4 oz (123.9 kg)   SpO2 97%   BMI 37.06 kg/m  , BMI Body mass index is 37.06 kg/m. Constitutional:  oriented to person, place, and time. No distress.  HENT:  Head: Grossly normal Eyes:  no discharge. No scleral icterus.  Neck: No JVD, no carotid bruits  Cardiovascular: Regular rate and rhythm, no murmurs  appreciated Pulmonary/Chest: Clear to auscultation bilaterally, no wheezes or rails Abdominal: Soft.  no distension.  no tenderness.  Musculoskeletal: Normal range of motion Neurological:  normal muscle tone. Coordination normal. No atrophy Skin: Skin warm and dry Psychiatric: normal affect, pleasant  Recent Labs: 09/06/2021: ALT 21; B Natriuretic Peptide 370.2 09/07/2021: TSH 1.751 09/09/2021: BUN 23; Creatinine, Ser 0.70; Magnesium 2.3; Potassium 3.6; Sodium 130 12/13/2021: Hemoglobin 12.2; Platelets 191.0    Lipid Panel Lab Results  Component Value Date   CHOL 125 07/22/2020   HDL 32.10 (L) 07/22/2020   LDLCALC 65 07/22/2020   TRIG 139.0 07/22/2020      Wt Readings from Last 3 Encounters:  07/17/22 273 lb 4 oz (123.9 kg)  07/09/22 260 lb (117.9 kg)  06/18/22 265 lb 2 oz (120.3 kg)     ASSESSMENT AND PLAN:  Congestive dilated cardiomyopathy (HCC) -  Chronically depressed ejection fraction estimated 25% Low blood pressure but asymptomatic Unable to tolerate jardiance/farxiga (tried 2-3 times), groin infections Recommend he continue metoprolol 150 daily, Lasix 80 daily, Entresto 49/51 mg twice daily with spironolactone 25 daily  Chronic systolic heart failure (HCC) -  Ejection fraction 25% per outside records Continue  medications as above relatively euvolemic  Preop cardiovascular evaluation Reports that he needs surgery on his left elbow, ulnar nerve distribution numbness down into his hand Acceptable risk for surgery, would need to hold Plavix 5-7  days prior to surgery  Stable angina ([place]) Currently with no symptoms of angina. No further workup at this time. Continue current medication regimen. May decrease Crestor up to 40 daily with Zetia  Morbid obesity with BMI of 40.0-44.9, adult [facility]) We have encouraged continued exercise, careful diet management in an effort to lose weight. On Ozempic  Hx of CABG Chronic stable anginal symptoms No further work-up needed at this time  OSA on CPAP Recommended compliance with his CPAP  Pure hypercholesterolemia Continue Crestor up to 40 and Zetia  Active smoker Current non-smoker  Diabetes type 2 with complications We have encouraged continued exercise, careful diet management in an effort to lose weight.   ICD Appears to be predominantly ventricularly paced today on EKG, this was discussed with the EP, device was reprogrammed with longer AV delay   Total encounter time more than 40 minutes  Greater than 50% was spent in counseling and coordination of care with the patient    Orders Placed This Encounter  Procedures   EKG 12-Lead     Signed, Esmond Plants, M.D., Ph.D. 07/17/2022  [place], [place]

## 2022-07-17 ENCOUNTER — Ambulatory Visit: Payer: Medicare Other | Attending: Cardiovascular Disease | Admitting: Cardiovascular Disease

## 2022-07-17 ENCOUNTER — Encounter: Payer: Self-pay | Admitting: Cardiovascular Disease

## 2022-07-17 VITALS — BP 100/64 | HR 74 | Ht 72.0 in | Wt 273.2 lb

## 2022-07-17 DIAGNOSIS — Z95810 Presence of automatic (implantable) cardiac defibrillator: Secondary | ICD-10-CM

## 2022-07-17 DIAGNOSIS — I5022 Chronic systolic (congestive) heart failure: Secondary | ICD-10-CM | POA: Diagnosis not present

## 2022-07-17 DIAGNOSIS — I10 Essential (primary) hypertension: Secondary | ICD-10-CM

## 2022-07-17 DIAGNOSIS — I493 Ventricular premature depolarization: Secondary | ICD-10-CM

## 2022-07-17 DIAGNOSIS — Z951 Presence of aortocoronary bypass graft: Secondary | ICD-10-CM | POA: Diagnosis not present

## 2022-07-17 DIAGNOSIS — E1165 Type 2 diabetes mellitus with hyperglycemia: Secondary | ICD-10-CM

## 2022-07-17 DIAGNOSIS — I255 Ischemic cardiomyopathy: Secondary | ICD-10-CM

## 2022-07-17 DIAGNOSIS — I442 Atrioventricular block, complete: Secondary | ICD-10-CM

## 2022-07-17 DIAGNOSIS — G4733 Obstructive sleep apnea (adult) (pediatric): Secondary | ICD-10-CM

## 2022-07-17 DIAGNOSIS — I25118 Atherosclerotic heart disease of native coronary artery with other forms of angina pectoris: Secondary | ICD-10-CM

## 2022-07-17 DIAGNOSIS — E7800 Pure hypercholesterolemia, unspecified: Secondary | ICD-10-CM

## 2022-07-17 MED ORDER — ROSUVASTATIN CALCIUM 40 MG PO TABS: 40.0000 mg | ORAL_TABLET | Freq: Every day | ORAL | 2 refills | Status: DC

## 2022-07-17 NOTE — Patient Instructions (Addendum)
Medication Instructions:  Please increase the crestor up to 40 mg daily  If you need a refill on your cardiac medications before your next appointment, please call your pharmacy.   Lab work: No new labs needed  Testing/Procedures: No new testing needed  Follow-Up: At [facility], you and your health needs are our priority.  As part of our continuing mission to provide you with exceptional heart care, we have created designated Provider Care Teams.  These Care Teams include your primary Cardiologist (physician) and Advanced Practice Providers (APPs -  Physician Assistants and Nurse Practitioners) who all work together to provide you with the care you need, when you need it.  You will need a follow up appointment in 6 months  Providers on your designated Care Team:   Murray Hodgkins, NP Christell Faith, PA-C Cadence Kathlen Mody, [place]  COVID-19 Vaccine Information can be found at: [email or web address] For questions related to vaccine distribution or appointments, please email [email or web address] or call [phone].

## 2022-07-19 ENCOUNTER — Ambulatory Visit (INDEPENDENT_AMBULATORY_CARE_PROVIDER_SITE_OTHER): Payer: Medicare Other | Admitting: Primary Care

## 2022-07-19 ENCOUNTER — Encounter: Payer: Self-pay | Admitting: Primary Care

## 2022-07-19 VITALS — BP 130/76 | HR 75 | Temp 97.9°F | Ht 72.0 in | Wt 269.0 lb

## 2022-07-19 DIAGNOSIS — R053 Chronic cough: Secondary | ICD-10-CM | POA: Insufficient documentation

## 2022-07-19 DIAGNOSIS — R051 Acute cough: Secondary | ICD-10-CM | POA: Diagnosis not present

## 2022-07-19 HISTORY — DX: Chronic cough: R05.3

## 2022-07-19 MED ORDER — PREDNISONE 20 MG PO TABS: ORAL_TABLET | ORAL | 0 refills | Status: DC

## 2022-07-19 NOTE — Progress Notes (Signed)
Subjective:    Patient ID: Brian Williams, male    DOB: 1961-07-18, 61 y.o.   MRN: [id number]  HPI  Brian Williams is a very pleasant 61 y.o. male with a significant medical history included CHF, hypertension, unstable angina, OSA on CPAP, type 2 diabetes, tobacco use, COPD, bronchitis who presents today to discuss cough.  Symptom onset in late September while on a cruise with slight cough. He then developed rhinorrhea, nasal congestion, shortness of breath.  He was evaluated by the cruise medical team, tested negative for influenza and COVID-19, was provided with a bottle of Robitussin to use as needed.  Evaluated by Cassandria Santee, NP on 06/18/2022 for a "5-day" of history of nonproductive cough without sputum production or chest congestion. He underwent chest x-ray which was negative for pneumonia or acute pulmonary issues.  He was treated with Ladona Ridgel and other conservative treatments. A few days later he called as he was feeling worse. He was treated with prednisone and a ten day course of doxycycline.   He completed his prednisone and doxycycline courses as prescribed and felt improved until about 2-3 days ago.    Current symptoms include cough, chest tightness, fatigue, rhinorrhea, and shortness of breath. He denies fevers, increased sputum production, chest heaviness/pain. He's just started Flonase this morning.  Robitussin does help with the cough.  Wt Readings from Last 3 Encounters:  07/19/22 269 lb (122 kg)  07/17/22 273 lb 4 oz (123.9 kg)  07/09/22 260 lb (117.9 kg)     Review of Systems  Constitutional:  Positive for fatigue. Negative for fever.  HENT:  Positive for congestion, postnasal drip and rhinorrhea. Negative for sore throat.   Respiratory:  Positive for cough and shortness of breath. Negative for chest tightness.   Cardiovascular:  Negative for chest pain.         Past Medical History:  Diagnosis Date   Acute appendicitis 04/03/2021   Acute pyelonephritis  09/07/2021   AICD (automatic cardioverter/defibrillator) present    Allergy    Anxiety    takes Xanax as needed   Appendicitis    Arthritis    back,knees,right shoulder   Back pain    Bacteremia due to Klebsiella pneumoniae 09/07/2021   Cardiomyopathy ([place])    CHF (congestive heart failure) ([place])    takes Lasix and Aldactone daily   Coronary artery disease    takes Plavix daily   Depression    takes Zoloft daily   Diabetes mellitus without complication (HCC)    Humulin R and Farxiga daily.Fasting blood sugar runs 140   GERD (gastroesophageal reflux disease)    Headache    History of bronchitis    History of colon polyps    benign   History of hiatal hernia    Hyperlipidemia    takes Fenofibrate,Crestor, and Zetia daily   Hypertension    takes Entresto and Coreg daily   MI (myocardial infarction) ([place]) 2001   Multifocal pneumonia 09/07/2021   Obesity    Peripheral neuropathy    Pneumonia    history of-last time about 14 yrs ago   PONV (postoperative nausea and vomiting)    after knee surgery 25 yrs ago b/p stayed elevated for a while   Presence of permanent cardiac pacemaker    Sepsis ([place]) 09/06/2021   Shortness of breath dyspnea    with exertion   Sleep apnea    uses CPAP nightly   Ventricular tachycardia ([place])    s/p RFCA PVCs 2013  Social History   Socioeconomic History   Marital status: Married    Spouse name: Rhydian Baldi   Number of children: 2   Years of education: Not on file   Highest education level: Not on file  Occupational History   Not on file  Tobacco Use   Smoking status: Former    Packs/day: 1.00    Years: 25.00    Total pack years: 25.00    Types: Cigarettes    Quit date: 05/19/2018    Years since quitting: 4.1   Smokeless tobacco: Never  Vaping Use   Vaping Use: Never used  Substance and Sexual Activity   Alcohol use: No    Comment: rare   Drug use: No   Sexual activity: Not Currently  Other Topics Concern   Not on  file  Social History Narrative   Married.   Moved from [place].   Disabled.   Social Determinants of Health   Financial Resource Strain: Low Risk  (07/21/2021)   Overall Financial Resource Strain (CARDIA)    Difficulty of Paying Living Expenses: Not hard at all  Food Insecurity: No Food Insecurity (07/21/2021)   Hunger Vital Sign    Worried About Running Out of Food in the Last Year: Never true    Ran Out of Food in the Last Year: Never true  Transportation Needs: No Transportation Needs (07/21/2021)   PRAPARE - Hydrologist (Medical): No    Lack of Transportation (Non-Medical): No  Physical Activity: Inactive (07/21/2021)   Exercise Vital Sign    Days of Exercise per Week: 0 days    Minutes of Exercise per Session: 0 min  Stress: No Stress Concern Present (07/21/2021)   [place]    Feeling of Stress : Not at all  Social Connections: Socially Isolated (07/21/2021)   Social Connection and Isolation Panel [NHANES]    Frequency of Communication with Friends and Family: Never    Frequency of Social Gatherings with Friends and Family: Never    Attends Religious Services: Never    Marine scientist or Organizations: No    Attends Archivist Meetings: Never    Marital Status: Married  Human resources officer Violence: Not At Risk (07/21/2021)   Humiliation, Afraid, Rape, and Kick questionnaire    Fear of Current or Ex-Partner: No    Emotionally Abused: No    Physically Abused: No    Sexually Abused: No    Past Surgical History:  Procedure Laterality Date   APPENDECTOMY     [place] Left 05/10/2016   Procedure: Left Heart Cath and Coronary Angiography;  Surgeon: Minna Merritts, MD;  Location: [facility];  Service: Cardiovascular;  Laterality: Left;   CARDIAC DEFIBRILLATOR PLACEMENT  10/16/2007   ICD Model  number 2207-36 serial number [id number]   CARDIAC ELECTROPHYSIOLOGY STUDY AND ABLATION     CHOLECYSTECTOMY  2001   COLONOSCOPY     COLONOSCOPY WITH PROPOFOL N/A 08/28/2018   Procedure: COLONOSCOPY WITH PROPOFOL;  Surgeon: Jonathon Bellows, MD;  Location: [facility] ENDOSCOPY;  Service: Gastroenterology;  Laterality: N/A;   COLONOSCOPY WITH PROPOFOL N/A 10/06/2021   Procedure: COLONOSCOPY WITH PROPOFOL;  Surgeon: Jonathon Bellows, MD;  Location: [facility] ENDOSCOPY;  Service: Gastroenterology;  Laterality: N/A;   CORONARY ANGIOPLASTY WITH STENT PLACEMENT     7 stents   CORONARY ARTERY BYPASS GRAFT  N/A 05/24/2016   Procedure: CORONARY ARTERY BYPASS GRAFTING (CABG) x four , using left internal mammary artery and left leg greater saphenous vein harvested endoscopically;  Surgeon: Gaye Pollack, MD;  Location: [facility];  Service: Open Heart Surgery;  Laterality: N/A;   EP IMPLANTABLE DEVICE N/A 06/16/2015   Procedure: ICD Generator Changeout;  Surgeon: Deboraha Sprang, MD;  Location: [facility];  Service: Cardiovascular;  Laterality: N/A;   ESOPHAGOGASTRODUODENOSCOPY     INSERT / REPLACE / REMOVE PACEMAKER     KNEE SURGERY     bilateral knee    [name] WITHOUT CARDIOVERSION N/A 05/24/2016   Procedure: TRANSESOPHAGEAL ECHOCARDIOGRAM ([name]);  Surgeon: Gaye Pollack, MD;  Location: [facility];  Service: Open Heart Surgery;  Laterality: N/A;   VASECTOMY      Family History  Problem Relation Age of Onset   Heart attack Mother 103   Hypertension Mother    Melanoma Mother    Heart attack Father 38   Hypertension Father    Hypertension Maternal Uncle    Heart disease Maternal Uncle    Heart disease Maternal Grandmother    Stroke Maternal Grandmother    Diabetes Neg Hx     Allergies  Allergen Reactions   Naproxen Other (See Comments), Anxiety and Rash    Causes hyperactivity causes hyperactivity   Morphine And Related Nausea And Vomiting    Morphine only    Current Outpatient Medications on File Prior to Visit   Medication Sig Dispense Refill   aspirin EC 81 MG tablet Take 81 mg by mouth daily.  90 tablet    clopidogrel (PLAVIX) 75 MG tablet Take 1 tablet (75 mg total) by mouth daily. 90 tablet 0   DULoxetine (CYMBALTA) 20 MG capsule Take 2 capsules (40 mg total) by mouth daily. 60 capsule 5   escitalopram (LEXAPRO) 20 MG tablet TAKE 1 TABLET BY MOUTH DAILY FOR ANXIETY AND DEPRESSION. GENERIC EQUIVALENT FOR LEXAPRO 90 tablet 2   ezetimibe (ZETIA) 10 MG tablet Take 1 tablet (10 mg total) by mouth daily. For cholesterol. 90 tablet 0   furosemide (LASIX) 40 MG tablet Take 80 mg by mouth daily.     gabapentin (NEURONTIN) 600 MG tablet TAKE TWO TABLETS BY MOUTH TWICE A DAY FOR PAIN 360 tablet 1   gemfibrozil (LOPID) 600 MG tablet TAKE 1 TABLET( '600MG'$  TOTAL) BY MOUTH DAILY FOR CHOLESTEROL Strength: 600 mg 90 tablet 1   insulin regular human CONCENTRATED (HUMULIN R) 500 UNIT/ML injection Inject 0-25 Units into the skin daily at 12 noon.     methocarbamol (ROBAXIN) 750 MG tablet Take 1 tablet (750 mg total) by mouth every 8 (eight) hours as needed for muscle spasms. 90 tablet 5   metoprolol succinate (TOPROL-XL) 100 MG 24 hr tablet Take 150 mg by mouth daily.     nitroGLYCERIN (NITROSTAT) 0.4 MG SL tablet Place 1 tablet (0.4 mg total) under the tongue every 5 (five) minutes as needed for chest pain. 25 tablet 1   omega-3 acid ethyl esters (LOVAZA) 1 g capsule Take 1 g by mouth 2 (two) times daily.     rosuvastatin (CRESTOR) 40 MG tablet Take 1 tablet (40 mg total) by mouth daily. 90 tablet 2   sacubitril-valsartan (ENTRESTO) 49-51 MG Take 1 tablet by mouth 2 (two) times daily.     Semaglutide, 2 MG/DOSE, (OZEMPIC, 2 MG/DOSE,) 8 MG/3ML SOPN Inject 2 mg into the skin every Friday.     spironolactone (ALDACTONE) 25 MG tablet  Take 25 mg by mouth daily.     traZODone (DESYREL) 100 MG tablet TAKE 2 TABLETS BY MOUTH  EVERY NIGHT AT BEDTIME for sleep. 180 tablet 3   benzonatate (TESSALON) 200 MG capsule Take 1 capsule  (200 mg total) by mouth 2 (two) times daily as needed for cough. (Patient not taking: Reported on 07/17/2022) 20 capsule 0   No current facility-administered medications on file prior to visit.    BP 130/76   Pulse 75   Temp 97.9 F (36.6 C) (Temporal)   Ht 6' (1.829 m)   Wt 269 lb (122 kg)   SpO2 98%   BMI 36.48 kg/m  Objective:   Physical Exam Constitutional:      Appearance: He is not ill-appearing.  HENT:     Right Ear: Tympanic membrane and ear canal normal.     Left Ear: Tympanic membrane and ear canal normal.  Cardiovascular:     Rate and Rhythm: Normal rate.  Pulmonary:     Effort: Pulmonary effort is normal.     Breath sounds: No wheezing or rhonchi.     Comments: Dry cough noted during visit. Lymphadenopathy:     Cervical: No cervical adenopathy.  Skin:    General: Skin is warm and dry.  Neurological:     Mental Status: He is alert.           Assessment & Plan:   Problem List Items Addressed This Visit       Other   Acute cough - Primary    Suspect viral URI at this point. He overall appears well and stable, vitals within normal range.  Recommended repeat chest x-ray, he currently declines. Treat for potential COPD exacerbation with prednisone 40 mg daily x5 days. Continue Flonase.  Close follow-up early next week, he will update on Monday.      Relevant Medications   predniSONE (DELTASONE) 20 MG tablet       Pleas Koch, NP

## 2022-07-19 NOTE — Assessment & Plan Note (Signed)
Suspect viral URI at this point. He overall appears well and stable, vitals within normal range.  Recommended repeat chest x-ray, he currently declines. Treat for potential COPD exacerbation with prednisone 40 mg daily x5 days. Continue Flonase.  Close follow-up early next week, he will update on Monday.

## 2022-07-24 ENCOUNTER — Telehealth: Payer: Self-pay

## 2022-07-24 NOTE — Telephone Encounter (Signed)
Unsuccessful attempt to reach patient on preferred number listed in notes for scheduled AWV. Left message on voicemail okay to reschedule. 

## 2022-08-07 ENCOUNTER — Ambulatory Visit (INDEPENDENT_AMBULATORY_CARE_PROVIDER_SITE_OTHER): Payer: Medicare Other

## 2022-08-07 DIAGNOSIS — I442 Atrioventricular block, complete: Secondary | ICD-10-CM

## 2022-08-07 DIAGNOSIS — E118 Type 2 diabetes mellitus with unspecified complications: Secondary | ICD-10-CM | POA: Diagnosis not present

## 2022-08-07 DIAGNOSIS — Z794 Long term (current) use of insulin: Secondary | ICD-10-CM | POA: Diagnosis not present

## 2022-08-07 DIAGNOSIS — G4730 Sleep apnea, unspecified: Secondary | ICD-10-CM | POA: Diagnosis not present

## 2022-08-07 LAB — CUP PACEART REMOTE DEVICE CHECK
Battery Remaining Longevity: 31 mo
Battery Remaining Percentage: 34 %
Battery Voltage: 2.86 V
Brady Statistic AP VP Percent: 1 %
Brady Statistic AP VS Percent: 4.8 %
Brady Statistic AS VP Percent: 17 %
Brady Statistic AS VS Percent: 77 %
Brady Statistic RA Percent Paced: 4.5 %
Brady Statistic RV Percent Paced: 18 %
Date Time Interrogation Session: 20231121020015
HighPow Impedance: 42 Ohm
HighPow Impedance: 42 Ohm
Implantable Lead Connection Status: 753985
Implantable Lead Connection Status: 753985
Implantable Lead Implant Date: 20160929
Implantable Lead Implant Date: 20160929
Implantable Lead Location: 753859
Implantable Lead Location: 753860
Implantable Lead Model: 7071
Implantable Pulse Generator Implant Date: 20160929
Lead Channel Impedance Value: 340 Ohm
Lead Channel Impedance Value: 400 Ohm
Lead Channel Pacing Threshold Amplitude: 0.75 V
Lead Channel Pacing Threshold Amplitude: 0.75 V
Lead Channel Pacing Threshold Pulse Width: 0.4 ms
Lead Channel Pacing Threshold Pulse Width: 0.5 ms
Lead Channel Sensing Intrinsic Amplitude: 3.8 mV
Lead Channel Sensing Intrinsic Amplitude: 9.5 mV
Lead Channel Setting Pacing Amplitude: 2 V
Lead Channel Setting Pacing Amplitude: 2.5 V
Lead Channel Setting Pacing Pulse Width: 0.5 ms
Lead Channel Setting Sensing Sensitivity: 0.5 mV
Pulse Gen Serial Number: 7306375

## 2022-08-13 ENCOUNTER — Encounter: Payer: Self-pay | Admitting: Internal Medicine

## 2022-08-14 DIAGNOSIS — I2510 Atherosclerotic heart disease of native coronary artery without angina pectoris: Secondary | ICD-10-CM | POA: Diagnosis not present

## 2022-08-14 DIAGNOSIS — I255 Ischemic cardiomyopathy: Secondary | ICD-10-CM | POA: Diagnosis not present

## 2022-08-14 DIAGNOSIS — G4733 Obstructive sleep apnea (adult) (pediatric): Secondary | ICD-10-CM | POA: Diagnosis not present

## 2022-08-14 DIAGNOSIS — I10 Essential (primary) hypertension: Secondary | ICD-10-CM | POA: Diagnosis not present

## 2022-08-15 ENCOUNTER — Other Ambulatory Visit: Payer: Self-pay | Admitting: Primary Care

## 2022-08-15 ENCOUNTER — Ambulatory Visit (INDEPENDENT_AMBULATORY_CARE_PROVIDER_SITE_OTHER)
Admission: RE | Admit: 2022-08-15 | Discharge: 2022-08-15 | Disposition: A | Discharge status: Home / Self Care | Payer: Medicare Other | Source: Ambulatory Visit | Attending: Primary Care | Admitting: Primary Care

## 2022-08-15 ENCOUNTER — Encounter: Payer: Self-pay | Admitting: Primary Care

## 2022-08-15 ENCOUNTER — Ambulatory Visit (INDEPENDENT_AMBULATORY_CARE_PROVIDER_SITE_OTHER): Payer: Medicare Other | Admitting: Primary Care

## 2022-08-15 ENCOUNTER — Telehealth: Payer: Self-pay | Admitting: Primary Care

## 2022-08-15 VITALS — BP 138/76 | HR 75 | Temp 97.2°F | Ht 72.0 in | Wt 277.0 lb

## 2022-08-15 DIAGNOSIS — R053 Chronic cough: Secondary | ICD-10-CM

## 2022-08-15 DIAGNOSIS — E7800 Pure hypercholesterolemia, unspecified: Secondary | ICD-10-CM | POA: Diagnosis not present

## 2022-08-15 DIAGNOSIS — R059 Cough, unspecified: Secondary | ICD-10-CM | POA: Diagnosis not present

## 2022-08-15 LAB — LIPID PANEL
Cholesterol: 109 mg/dL (ref 0–200)
HDL: 29.8 mg/dL — ABNORMAL LOW (ref >39.00)
NonHDL: 79.5
Total CHOL/HDL Ratio: 4
Triglycerides: 364 mg/dL — ABNORMAL HIGH (ref 0.0–149.0)
VLDL: 72.8 mg/dL — ABNORMAL HIGH (ref 0.0–40.0)

## 2022-08-15 LAB — CBC WITH DIFFERENTIAL/PLATELET
Basophils Absolute: 0 10*3/uL (ref 0.0–0.1)
Basophils Relative: 0.8 % (ref 0.0–3.0)
Eosinophils Absolute: 0.2 10*3/uL (ref 0.0–0.7)
Eosinophils Relative: 3 % (ref 0.0–5.0)
HCT: 31.9 % — ABNORMAL LOW (ref 39.0–52.0)
Hemoglobin: 11.2 g/dL — ABNORMAL LOW (ref 13.0–17.0)
Lymphocytes Relative: 42 % (ref 12.0–46.0)
Lymphs Abs: 2.5 10*3/uL (ref 0.7–4.0)
MCHC: 35.1 g/dL (ref 30.0–36.0)
MCV: 93.8 fl (ref 78.0–100.0)
Monocytes Absolute: 0.4 10*3/uL (ref 0.1–1.0)
Monocytes Relative: 7.4 % (ref 3.0–12.0)
Neutro Abs: 2.8 10*3/uL (ref 1.4–7.7)
Neutrophils Relative %: 46.8 % (ref 43.0–77.0)
Platelets: 173 10*3/uL (ref 150.0–400.0)
RBC: 3.4 Mil/uL — ABNORMAL LOW (ref 4.22–5.81)
RDW: 14.9 % (ref 11.5–15.5)
WBC: 6.1 10*3/uL (ref 4.0–10.5)

## 2022-08-15 LAB — LDL CHOLESTEROL, DIRECT: Direct LDL: 48 mg/dL

## 2022-08-15 MED ORDER — CETIRIZINE HCL 10 MG PO TABS: 10.0000 mg | ORAL_TABLET | Freq: Every day | ORAL | 0 refills | Status: DC

## 2022-08-15 MED ORDER — BUDESONIDE-FORMOTEROL FUMARATE 160-4.5 MCG/ACT IN AERO: 2.0000 | INHALATION_SPRAY | Freq: Two times a day (BID) | RESPIRATORY_TRACT | 0 refills | Status: DC

## 2022-08-15 NOTE — Assessment & Plan Note (Addendum)
Differentials include COPD, GERD, allergic rhinitis with post nasal drip.  Congested lung sounds that are concerning. Checking chest xray again today. Checking CBC with diff.  Start Zyrtec 10 mg daily. Consider adding PPI HS. Consider LABA/ICS for COPD.   Await chest xray results.

## 2022-08-15 NOTE — Telephone Encounter (Signed)
Pt called stating he seen [facility]ark today, 08/15/22 & they discussed his issues with his breathing. Pt is requesting for a inhaler to be prescribed? Preferred [facility] is [facility], [place]. Call back # [phone]

## 2022-08-15 NOTE — Progress Notes (Signed)
Subjective:    Patient ID: Brian Williams, male    DOB: Jul 26, 1961, 61 y.o.   MRN: [id number]  Cough Associated symptoms include postnasal drip, rhinorrhea and shortness of breath. Pertinent negatives include no fever.    Brian Williams is a very pleasant 61 y.o. male with a significant medical history including COPD, tobacco abuse, CAD, CHF, type 2 diabetes, bronchitis OSA on CPAP who presents today to discuss cough.  Symptom onset three months ago while on a cruise. Evaluated by Brian Williams on 06/18/22, chest xray negative, conservative care was recommended for viral URI. Treated several days later with Doxycycline and prednisone. Evaluated by me on 07/19/22 for ongoing symptoms of fatigue, SOB, cough. Recommendation was to repeat chest xray but he declined. He was treated with prednisone burst and Flonase, and was advised to update one week later.  Today he continues to experience a consistent cough that occurs during the day and night. Also with shortness of breath, rhinorrhea, chest congestion. He denies chills, fevers, body aches.   He is managed is not managed on inhalers for his COPD. He is not managed on PPI and denies esophageal burning and throat fullness. He is not taking an antihistamine.   He's been taking Flonase and Robitussin without much improvement. When treated with prednisone earlier this month he felt no improvement in cough or SOB.   He is also needing a lipid panel drawn per his cardiologist.    Review of Systems  Constitutional:  Negative for fever.  HENT:  Positive for congestion, postnasal drip and rhinorrhea.   Respiratory:  Positive for cough and shortness of breath.          Past Medical History:  Diagnosis Date   Acute appendicitis 04/03/2021   Acute pyelonephritis 09/07/2021   AICD (automatic cardioverter/defibrillator) present    Allergy    Anxiety    takes Xanax as needed   Appendicitis    Arthritis    back,knees,right shoulder   Back pain     Bacteremia due to Klebsiella pneumoniae 09/07/2021   Cardiomyopathy ([place])    CHF (congestive heart failure) ([place])    takes Lasix and Aldactone daily   Coronary artery disease    takes Plavix daily   Depression    takes Zoloft daily   Diabetes mellitus without complication (HCC)    Humulin R and Farxiga daily.Fasting blood sugar runs 140   GERD (gastroesophageal reflux disease)    Headache    History of bronchitis    History of colon polyps    benign   History of hiatal hernia    Hyperlipidemia    takes Fenofibrate,Crestor, and Zetia daily   Hypertension    takes Entresto and Coreg daily   MI (myocardial infarction) ([place]) 2001   Multifocal pneumonia 09/07/2021   Obesity    Peripheral neuropathy    Pneumonia    history of-last time about 14 yrs ago   PONV (postoperative nausea and vomiting)    after knee surgery 25 yrs ago b/p stayed elevated for a while   Presence of permanent cardiac pacemaker    Sepsis ([place]) 09/06/2021   Shortness of breath dyspnea    with exertion   Sleep apnea    uses CPAP nightly   Ventricular tachycardia ([place])    s/p RFCA PVCs 2013    Social History   Socioeconomic History   Marital status: Married    Spouse name: Brian Williams   Number of children: 2  Years of education: Not on file   Highest education level: Not on file  Occupational History   Not on file  Tobacco Use   Smoking status: Former    Packs/day: 1.00    Years: 25.00    Total pack years: 25.00    Types: Cigarettes    Quit date: 05/19/2018    Years since quitting: 4.2   Smokeless tobacco: Never  Vaping Use   Vaping Use: Never used  Substance and Sexual Activity   Alcohol use: No    Comment: rare   Drug use: No   Sexual activity: Not Currently  Other Topics Concern   Not on file  Social History Narrative   Married.   Moved from [place].   Disabled.   Social Determinants of Health   Financial Resource Strain: Low Risk  (07/21/2021)   Overall Financial  Resource Strain (CARDIA)    Difficulty of Paying Living Expenses: Not hard at all  Food Insecurity: No Food Insecurity (07/21/2021)   Hunger Vital Sign    Worried About Running Out of Food in the Last Year: Never true    Ran Out of Food in the Last Year: Never true  Transportation Needs: No Transportation Needs (07/21/2021)   PRAPARE - Hydrologist (Medical): No    Lack of Transportation (Non-Medical): No  Physical Activity: Inactive (07/21/2021)   Exercise Vital Sign    Days of Exercise per Week: 0 days    Minutes of Exercise per Session: 0 min  Stress: No Stress Concern Present (07/21/2021)   [place]    Feeling of Stress : Not at all  Social Connections: Socially Isolated (07/21/2021)   Social Connection and Isolation Panel [NHANES]    Frequency of Communication with Friends and Family: Never    Frequency of Social Gatherings with Friends and Family: Never    Attends Religious Services: Never    Marine scientist or Organizations: No    Attends Archivist Meetings: Never    Marital Status: Married  Human resources officer Violence: Not At Risk (07/21/2021)   Humiliation, Afraid, Rape, and Kick questionnaire    Fear of Current or Ex-Partner: No    Emotionally Abused: No    Physically Abused: No    Sexually Abused: No    Past Surgical History:  Procedure Laterality Date   APPENDECTOMY     [place] Left 05/10/2016   Procedure: Left Heart Cath and Coronary Angiography;  Surgeon: Minna Merritts, MD;  Location: [facility];  Service: Cardiovascular;  Laterality: Left;   CARDIAC DEFIBRILLATOR PLACEMENT  10/16/2007   ICD Model number 2207-36 serial number [id number]   CARDIAC ELECTROPHYSIOLOGY STUDY AND ABLATION     CHOLECYSTECTOMY  2001   COLONOSCOPY     COLONOSCOPY WITH PROPOFOL N/A 08/28/2018   Procedure:  COLONOSCOPY WITH PROPOFOL;  Surgeon: Jonathon Bellows, MD;  [facility] ENDOSCOPY;  Service: Gastroenterology;  Laterality: N/A;   COLONOSCOPY WITH PROPOFOL N/A 10/06/2021   Procedure: COLONOSCOPY WITH PROPOFOL;  Surgeon: Jonathon Bellows, MD;  Location[facility] ENDOSCOPY;  Service: Gastroenterology;  Laterality: N/A;   CORONARY ANGIOPLASTY WITH STENT PLACEMENT     7 stents   CORONARY ARTERY BYPASS GRAFT N/A 05/24/2016   Procedure: CORONARY ARTERY BYPASS GRAFTING (CABG) x four , using left internal mammary artery and left leg greater saphenous vein harvested endoscopically;  Surgeon: Gaye Pollack, MD;  Location: [place];  Service: Open Heart Surgery;  Laterality: N/A;   EP IMPLANTABLE DEVICE N/A 06/16/2015   Procedure: ICD Generator Changeout;  Surgeon: Deboraha Sprang, MD;  Location: [place] [facility];  Service: Cardiovascular;  Laterality: N/A;   ESOPHAGOGASTRODUODENOSCOPY     INSERT / REPLACE / REMOVE PACEMAKER     KNEE SURGERY     bilateral knee    [name] WITHOUT CARDIOVERSION N/A 05/24/2016   Procedure: TRANSESOPHAGEAL ECHOCARDIOGRAM ([name]);  Surgeon: Gaye Pollack, MD;  Location: [facility];  Service: Open Heart Surgery;  Laterality: N/A;   VASECTOMY      Family History  Problem Relation Age of Onset   Heart attack Mother 102   Hypertension Mother    Melanoma Mother    Heart attack Father 22   Hypertension Father    Hypertension Maternal Uncle    Heart disease Maternal Uncle    Heart disease Maternal Grandmother    Stroke Maternal Grandmother    Diabetes Neg Hx     Allergies  Allergen Reactions   Naproxen Other (See Comments), Anxiety and Rash    Causes hyperactivity causes hyperactivity   Morphine And Related Nausea And Vomiting    Morphine only    Current Outpatient Medications on File Prior to Visit  Medication Sig Dispense Refill   aspirin EC 81 MG tablet Take 81 mg by mouth daily.  90 tablet    clopidogrel (PLAVIX) 75 MG tablet Take 1 tablet (75 mg total) by mouth daily. 90 tablet  0   DULoxetine (CYMBALTA) 20 MG capsule Take 2 capsules (40 mg total) by mouth daily. 60 capsule 5   escitalopram (LEXAPRO) 20 MG tablet TAKE 1 TABLET BY MOUTH DAILY FOR ANXIETY AND DEPRESSION. GENERIC EQUIVALENT FOR LEXAPRO 90 tablet 2   ezetimibe (ZETIA) 10 MG tablet Take 1 tablet (10 mg total) by mouth daily. For cholesterol. 90 tablet 0   furosemide (LASIX) 40 MG tablet Take 80 mg by mouth daily.     gabapentin (NEURONTIN) 600 MG tablet TAKE TWO TABLETS BY MOUTH TWICE A DAY FOR PAIN 360 tablet 1   gemfibrozil (LOPID) 600 MG tablet TAKE 1 TABLET( '600MG'$  TOTAL) BY MOUTH DAILY FOR CHOLESTEROL Strength: 600 mg 90 tablet 1   insulin regular human CONCENTRATED (HUMULIN R) 500 UNIT/ML injection Inject 0-25 Units into the skin daily at 12 noon.     methocarbamol (ROBAXIN) 750 MG tablet Take 1 tablet (750 mg total) by mouth every 8 (eight) hours as needed for muscle spasms. 90 tablet 5   metoprolol succinate (TOPROL-XL) 100 MG 24 hr tablet Take 150 mg by mouth daily.     nitroGLYCERIN (NITROSTAT) 0.4 MG SL tablet Place 1 tablet (0.4 mg total) under the tongue every 5 (five) minutes as needed for chest pain. 25 tablet 1   omega-3 acid ethyl esters (LOVAZA) 1 g capsule Take 1 g by mouth 2 (two) times daily.     rosuvastatin (CRESTOR) 40 MG tablet Take 1 tablet (40 mg total) by mouth daily. 90 tablet 2   sacubitril-valsartan (ENTRESTO) 49-51 MG Take 1 tablet by mouth 2 (two) times daily.     Semaglutide, 2 MG/DOSE, (OZEMPIC, 2 MG/DOSE,) 8 MG/3ML SOPN Inject 2 mg into the skin every Friday.     spironolactone (ALDACTONE) 25 MG tablet Take 25 mg by mouth daily.     traZODone (DESYREL) 100 MG tablet TAKE 2 TABLETS BY MOUTH  EVERY NIGHT AT BEDTIME for sleep.  180 tablet 3   No current facility-administered medications on file prior to visit.    BP 138/76   Pulse 75   Temp (!) 97.2 F (36.2 C) (Temporal)   Ht 6' (1.829 m)   Wt 277 lb (125.6 kg)   SpO2 95%   BMI 37.57 kg/m  Objective:   Physical  Exam Constitutional:      Appearance: He is not ill-appearing.  HENT:     Right Ear: Tympanic membrane and ear canal normal.     Left Ear: Tympanic membrane and ear canal normal.     Mouth/Throat:     Mouth: Mucous membranes are moist.  Cardiovascular:     Rate and Rhythm: Normal rate.  Pulmonary:     Effort: Pulmonary effort is normal.     Breath sounds: Examination of the right-upper field reveals rhonchi. Examination of the left-upper field reveals rhonchi. Examination of the right-lower field reveals rhonchi. Examination of the left-lower field reveals rhonchi. Rhonchi present. No decreased breath sounds or wheezing.     Comments: Dry cough noted several times throughout his visit.  Skin:    General: Skin is warm and dry.  Neurological:     Mental Status: He is alert.           Assessment & Plan:   Problem List Items Addressed This Visit       Other   Hyperlipidemia   Relevant Orders   Lipid panel   Persistent cough for 3 weeks or longer - Primary    Differentials include COPD, GERD, allergic rhinitis with post nasal drip.  Congested lung sounds that are concerning. Checking chest xray again today. Checking CBC with diff.  Start Zyrtec 10 mg daily. Consider adding PPI HS. Consider LABA/ICS for COPD.   Await chest xray results.       Relevant Medications   cetirizine (ZYRTEC) 10 MG tablet   Other Relevant Orders   DG Chest 2 View   CBC with Differential/Platelet       Pleas Koch, Williams

## 2022-08-15 NOTE — Telephone Encounter (Signed)
See result note.  

## 2022-08-21 ENCOUNTER — Telehealth: Payer: Self-pay

## 2022-08-21 NOTE — Telephone Encounter (Signed)
Patient called back in returning a call he received. He stated that he would like for it to go to [facility] for now since he is out. Thank you!

## 2022-08-21 NOTE — Telephone Encounter (Signed)
Received refill request for Gabapentin 600 mg tab from [facility].     Unable to reach patient left voicemail for patient to return call to office.   Previously this has been sent to [facility] [place].  Does he want this sent to [facility] now?

## 2022-08-21 NOTE — Telephone Encounter (Signed)
error 

## 2022-08-22 ENCOUNTER — Other Ambulatory Visit: Payer: Self-pay

## 2022-08-22 DIAGNOSIS — E119 Type 2 diabetes mellitus without complications: Secondary | ICD-10-CM | POA: Diagnosis not present

## 2022-08-22 DIAGNOSIS — R059 Cough, unspecified: Secondary | ICD-10-CM | POA: Diagnosis not present

## 2022-08-22 DIAGNOSIS — Z794 Long term (current) use of insulin: Secondary | ICD-10-CM | POA: Diagnosis not present

## 2022-08-22 DIAGNOSIS — I5020 Unspecified systolic (congestive) heart failure: Secondary | ICD-10-CM | POA: Diagnosis not present

## 2022-08-22 NOTE — Telephone Encounter (Signed)
Noted. Please let him know that I got his CT chest approved by his insurance company just now.  He will be contacted soon regarding getting this scheduled.

## 2022-08-22 NOTE — Telephone Encounter (Signed)
Please call patient:  He should not be out of gabapentin as I sent a 29-monthsupply to his mail order pharmacy in October 2023.  We need to find out some additional information regarding why he is out. The prescription is written to take 2 tablets twice daily.  Also, he has not responded to my MyChart message regarding the CT scan and his lab that I recommended.  His insurance is being very difficult and approving the CT scan of his chest. It may take a few more days.    How is his breathing and cough?  When is he scheduled to see his cardiologist?

## 2022-08-22 NOTE — Telephone Encounter (Signed)
Patient notified he will be receiving a call soon to schedule.  Patient states he has tried to send the refill request to Dr. Holley Raring for methocarbamol and "they are ignoring the requests".  He says he will continue to reach out to them about getting this refilled.

## 2022-08-22 NOTE — Telephone Encounter (Signed)
Called and spoke with patient he states he was confused regarding the medication that was in question. He has plenty of gabapentin. He is needing a refill on methocarbamol, he uses this pretty much daily. He is aware you are not the one who initially prescribed this, but is requesting you take over the prescription.   Patient says he will get Prilosec to start taking at bedtime, he confirms he is using his Symbicort inhaler that was prescribed. He will come in tomorrow morning 9 am to get the labs drawn.   He is aware that his insurance is taking some time to get the Chest CT approved, he understands.  He saw Endocrinology this afternoon and wanted to let us know that his provider is ordering a CT of his lungs since he used to be a smoker.  He says he is up and down with his cough and breathing. He feels about the same, some days he doesn't have much issue, other days he can't seem to stop coughing at all.   He is scheduled to see cardiology next week.

## 2022-08-23 ENCOUNTER — Other Ambulatory Visit (INDEPENDENT_AMBULATORY_CARE_PROVIDER_SITE_OTHER): Payer: Medicare Other

## 2022-08-23 ENCOUNTER — Ambulatory Visit
Admission: RE | Admit: 2022-08-23 | Discharge: 2022-08-23 | Disposition: A | Discharge status: Home / Self Care | Payer: Medicare Other | Source: Ambulatory Visit | Attending: Primary Care | Admitting: Primary Care

## 2022-08-23 ENCOUNTER — Other Ambulatory Visit: Payer: Self-pay | Admitting: Primary Care

## 2022-08-23 ENCOUNTER — Encounter: Payer: Self-pay | Admitting: Pediatrics

## 2022-08-23 DIAGNOSIS — R053 Chronic cough: Secondary | ICD-10-CM | POA: Diagnosis not present

## 2022-08-23 DIAGNOSIS — R918 Other nonspecific abnormal finding of lung field: Secondary | ICD-10-CM | POA: Diagnosis not present

## 2022-08-23 DIAGNOSIS — Z794 Long term (current) use of insulin: Secondary | ICD-10-CM

## 2022-08-23 DIAGNOSIS — Z950 Presence of cardiac pacemaker: Secondary | ICD-10-CM | POA: Diagnosis not present

## 2022-08-23 DIAGNOSIS — R059 Cough, unspecified: Secondary | ICD-10-CM | POA: Diagnosis not present

## 2022-08-23 DIAGNOSIS — I2510 Atherosclerotic heart disease of native coronary artery without angina pectoris: Secondary | ICD-10-CM | POA: Diagnosis not present

## 2022-08-23 DIAGNOSIS — E1165 Type 2 diabetes mellitus with hyperglycemia: Secondary | ICD-10-CM

## 2022-08-23 LAB — COMPREHENSIVE METABOLIC PANEL
ALT: 14 U/L (ref 0–53)
AST: 14 U/L (ref 0–37)
Albumin: 4.2 g/dL (ref 3.5–5.2)
Alkaline Phosphatase: 40 U/L (ref 39–117)
BUN: 9 mg/dL (ref 6–23)
CO2: 25 mEq/L (ref 19–32)
Calcium: 9.5 mg/dL (ref 8.4–10.5)
Chloride: 104 mEq/L (ref 96–112)
Creatinine, Ser: 0.75 mg/dL (ref 0.40–1.50)
GFR: 97.37 mL/min (ref >60.00)
Glucose, Bld: 159 mg/dL — ABNORMAL HIGH (ref 70–99)
Potassium: 3.5 mEq/L (ref 3.5–5.1)
Sodium: 139 mEq/L (ref 135–145)
Total Bilirubin: 0.4 mg/dL (ref 0.2–1.2)
Total Protein: 6.2 g/dL (ref 6.0–8.3)

## 2022-08-23 LAB — BRAIN NATRIURETIC PEPTIDE: Pro B Natriuretic peptide (BNP): 53 pg/mL (ref 0.0–100.0)

## 2022-08-23 LAB — HEMOGLOBIN A1C: Hgb A1c MFr Bld: 7.8 % — ABNORMAL HIGH (ref 4.6–6.5)

## 2022-08-23 LAB — TSH: TSH: 1.89 u[IU]/mL (ref 0.35–5.50)

## 2022-08-23 MED ORDER — IOHEXOL 300 MG/ML  SOLN
100.0000 mL | Freq: Once | INTRAMUSCULAR | Status: AC | PRN
  Administered 2022-08-23: 75 mL via INTRAVENOUS

## 2022-08-24 ENCOUNTER — Other Ambulatory Visit: Payer: Self-pay | Admitting: Primary Care

## 2022-08-24 ENCOUNTER — Encounter: Payer: Self-pay | Admitting: Internal Medicine

## 2022-08-24 DIAGNOSIS — J189 Pneumonia, unspecified organism: Secondary | ICD-10-CM

## 2022-08-24 MED ORDER — AZITHROMYCIN 250 MG PO TABS: ORAL_TABLET | ORAL | 0 refills | Status: DC

## 2022-08-24 MED ORDER — AMOXICILLIN-POT CLAVULANATE 875-125 MG PO TABS: 1.0000 | ORAL_TABLET | Freq: Two times a day (BID) | ORAL | 0 refills | Status: DC

## 2022-08-30 DIAGNOSIS — Z95810 Presence of automatic (implantable) cardiac defibrillator: Secondary | ICD-10-CM | POA: Diagnosis not present

## 2022-08-30 DIAGNOSIS — Z720 Tobacco use: Secondary | ICD-10-CM | POA: Diagnosis not present

## 2022-08-30 DIAGNOSIS — E781 Pure hyperglyceridemia: Secondary | ICD-10-CM | POA: Diagnosis not present

## 2022-08-30 DIAGNOSIS — I255 Ischemic cardiomyopathy: Secondary | ICD-10-CM | POA: Diagnosis not present

## 2022-08-30 DIAGNOSIS — E1142 Type 2 diabetes mellitus with diabetic polyneuropathy: Secondary | ICD-10-CM | POA: Diagnosis not present

## 2022-08-30 DIAGNOSIS — I10 Essential (primary) hypertension: Secondary | ICD-10-CM | POA: Diagnosis not present

## 2022-08-30 DIAGNOSIS — Z794 Long term (current) use of insulin: Secondary | ICD-10-CM | POA: Diagnosis not present

## 2022-08-30 DIAGNOSIS — G4733 Obstructive sleep apnea (adult) (pediatric): Secondary | ICD-10-CM | POA: Diagnosis not present

## 2022-08-30 DIAGNOSIS — I5022 Chronic systolic (congestive) heart failure: Secondary | ICD-10-CM | POA: Diagnosis not present

## 2022-08-30 DIAGNOSIS — I2510 Atherosclerotic heart disease of native coronary artery without angina pectoris: Secondary | ICD-10-CM | POA: Diagnosis not present

## 2022-08-31 NOTE — Progress Notes (Signed)
Remote ICD transmission.   

## 2022-09-05 DIAGNOSIS — J189 Pneumonia, unspecified organism: Secondary | ICD-10-CM

## 2022-09-06 NOTE — Telephone Encounter (Signed)
Called and spoke to patient, he states he sent that message on a separate thread on accident, he was messaging with Anda Kraft last night and she answered his questions. Advised if his symptoms are worsening that he needs to go to the hospital. He stated he is no worse than he has been. He plans to be here tomorrow to get labs done as requested by Anda Kraft

## 2022-09-06 NOTE — Telephone Encounter (Signed)
Unable to reach patient. Left voicemail to return call to our office.   

## 2022-09-07 ENCOUNTER — Other Ambulatory Visit (INDEPENDENT_AMBULATORY_CARE_PROVIDER_SITE_OTHER): Payer: Medicare Other

## 2022-09-07 DIAGNOSIS — Z794 Long term (current) use of insulin: Secondary | ICD-10-CM | POA: Diagnosis not present

## 2022-09-07 DIAGNOSIS — E1165 Type 2 diabetes mellitus with hyperglycemia: Secondary | ICD-10-CM | POA: Diagnosis not present

## 2022-09-07 DIAGNOSIS — J189 Pneumonia, unspecified organism: Secondary | ICD-10-CM | POA: Diagnosis not present

## 2022-09-07 LAB — CBC WITH DIFFERENTIAL/PLATELET
Basophils Absolute: 0 10*3/uL (ref 0.0–0.1)
Basophils Relative: 0.5 % (ref 0.0–3.0)
Eosinophils Absolute: 0.3 10*3/uL (ref 0.0–0.7)
Eosinophils Relative: 4.9 % (ref 0.0–5.0)
HCT: 35.3 % — ABNORMAL LOW (ref 39.0–52.0)
Hemoglobin: 12.2 g/dL — ABNORMAL LOW (ref 13.0–17.0)
Lymphocytes Relative: 47.4 % — ABNORMAL HIGH (ref 12.0–46.0)
Lymphs Abs: 2.6 10*3/uL (ref 0.7–4.0)
MCHC: 34.7 g/dL (ref 30.0–36.0)
MCV: 94.2 fl (ref 78.0–100.0)
Monocytes Absolute: 0.3 10*3/uL (ref 0.1–1.0)
Monocytes Relative: 5.3 % (ref 3.0–12.0)
Neutro Abs: 2.3 10*3/uL (ref 1.4–7.7)
Neutrophils Relative %: 41.9 % — ABNORMAL LOW (ref 43.0–77.0)
Platelets: 155 10*3/uL (ref 150.0–400.0)
RBC: 3.75 Mil/uL — ABNORMAL LOW (ref 4.22–5.81)
RDW: 14.9 % (ref 11.5–15.5)
WBC: 5.5 10*3/uL (ref 4.0–10.5)

## 2022-09-07 LAB — BASIC METABOLIC PANEL
BUN: 12 mg/dL (ref 6–23)
CO2: 26 mEq/L (ref 19–32)
Calcium: 9.9 mg/dL (ref 8.4–10.5)
Chloride: 103 mEq/L (ref 96–112)
Creatinine, Ser: 0.83 mg/dL (ref 0.40–1.50)
GFR: 94.41 mL/min (ref >60.00)
Glucose, Bld: 174 mg/dL — ABNORMAL HIGH (ref 70–99)
Potassium: 3.6 mEq/L (ref 3.5–5.1)
Sodium: 141 mEq/L (ref 135–145)

## 2022-09-07 LAB — MICROALBUMIN / CREATININE URINE RATIO
Creatinine,U: 155.7 mg/dL
Microalb Creat Ratio: 1.4 mg/g (ref 0.0–30.0)
Microalb, Ur: 2.2 mg/dL — ABNORMAL HIGH (ref 0.0–1.9)

## 2022-09-12 ENCOUNTER — Other Ambulatory Visit: Payer: Self-pay | Admitting: Primary Care

## 2022-09-12 DIAGNOSIS — G4700 Insomnia, unspecified: Secondary | ICD-10-CM

## 2022-09-19 DIAGNOSIS — Z7902 Long term (current) use of antithrombotics/antiplatelets: Secondary | ICD-10-CM | POA: Diagnosis not present

## 2022-09-19 DIAGNOSIS — E669 Obesity, unspecified: Secondary | ICD-10-CM | POA: Diagnosis not present

## 2022-09-19 DIAGNOSIS — E785 Hyperlipidemia, unspecified: Secondary | ICD-10-CM | POA: Diagnosis not present

## 2022-09-19 DIAGNOSIS — I255 Ischemic cardiomyopathy: Secondary | ICD-10-CM | POA: Diagnosis not present

## 2022-09-19 DIAGNOSIS — Z7982 Long term (current) use of aspirin: Secondary | ICD-10-CM | POA: Diagnosis not present

## 2022-09-19 DIAGNOSIS — G5622 Lesion of ulnar nerve, left upper limb: Secondary | ICD-10-CM | POA: Diagnosis not present

## 2022-09-19 DIAGNOSIS — I5022 Chronic systolic (congestive) heart failure: Secondary | ICD-10-CM | POA: Diagnosis not present

## 2022-09-19 DIAGNOSIS — E119 Type 2 diabetes mellitus without complications: Secondary | ICD-10-CM | POA: Diagnosis not present

## 2022-09-19 DIAGNOSIS — I110 Hypertensive heart disease with heart failure: Secondary | ICD-10-CM | POA: Diagnosis not present

## 2022-09-19 DIAGNOSIS — Z885 Allergy status to narcotic agent status: Secondary | ICD-10-CM | POA: Diagnosis not present

## 2022-09-19 DIAGNOSIS — Z6836 Body mass index (BMI) 36.0-36.9, adult: Secondary | ICD-10-CM | POA: Diagnosis not present

## 2022-09-19 DIAGNOSIS — G4730 Sleep apnea, unspecified: Secondary | ICD-10-CM | POA: Diagnosis not present

## 2022-09-19 DIAGNOSIS — Z794 Long term (current) use of insulin: Secondary | ICD-10-CM | POA: Diagnosis not present

## 2022-09-19 DIAGNOSIS — I2510 Atherosclerotic heart disease of native coronary artery without angina pectoris: Secondary | ICD-10-CM | POA: Diagnosis not present

## 2022-09-19 HISTORY — PX: OTHER SURGICAL HISTORY: SHX169

## 2022-09-27 DIAGNOSIS — Z8669 Personal history of other diseases of the nervous system and sense organs: Secondary | ICD-10-CM | POA: Diagnosis not present

## 2022-09-27 DIAGNOSIS — M25622 Stiffness of left elbow, not elsewhere classified: Secondary | ICD-10-CM | POA: Diagnosis not present

## 2022-09-27 DIAGNOSIS — Z48811 Encounter for surgical aftercare following surgery on the nervous system: Secondary | ICD-10-CM | POA: Diagnosis not present

## 2022-09-27 DIAGNOSIS — G5622 Lesion of ulnar nerve, left upper limb: Secondary | ICD-10-CM | POA: Diagnosis not present

## 2022-10-02 DIAGNOSIS — G5622 Lesion of ulnar nerve, left upper limb: Secondary | ICD-10-CM | POA: Diagnosis not present

## 2022-10-02 DIAGNOSIS — Z48811 Encounter for surgical aftercare following surgery on the nervous system: Secondary | ICD-10-CM | POA: Diagnosis not present

## 2022-10-02 DIAGNOSIS — M25622 Stiffness of left elbow, not elsewhere classified: Secondary | ICD-10-CM | POA: Diagnosis not present

## 2022-10-02 DIAGNOSIS — Z8669 Personal history of other diseases of the nervous system and sense organs: Secondary | ICD-10-CM | POA: Diagnosis not present

## 2022-10-11 DIAGNOSIS — Z48811 Encounter for surgical aftercare following surgery on the nervous system: Secondary | ICD-10-CM | POA: Diagnosis not present

## 2022-10-11 DIAGNOSIS — G5622 Lesion of ulnar nerve, left upper limb: Secondary | ICD-10-CM | POA: Diagnosis not present

## 2022-10-11 DIAGNOSIS — M25622 Stiffness of left elbow, not elsewhere classified: Secondary | ICD-10-CM | POA: Diagnosis not present

## 2022-10-11 DIAGNOSIS — Z8669 Personal history of other diseases of the nervous system and sense organs: Secondary | ICD-10-CM | POA: Diagnosis not present

## 2022-10-23 ENCOUNTER — Encounter: Payer: Self-pay | Admitting: Primary Care

## 2022-10-23 ENCOUNTER — Telehealth (INDEPENDENT_AMBULATORY_CARE_PROVIDER_SITE_OTHER): Payer: Medicare Other | Admitting: Primary Care

## 2022-10-23 VITALS — Ht 72.0 in | Wt 260.0 lb

## 2022-10-23 DIAGNOSIS — K529 Noninfective gastroenteritis and colitis, unspecified: Secondary | ICD-10-CM | POA: Diagnosis not present

## 2022-10-23 MED ORDER — COLESEVELAM HCL 625 MG PO TABS: 625.0000 mg | ORAL_TABLET | Freq: Every day | ORAL | 0 refills | Status: DC | PRN

## 2022-10-23 NOTE — Patient Instructions (Signed)
Start the [facility] medication for diarrhea. Take 1-2 tablets daily as needed for diarrhea.  Follow up with Gi as scheduled.  It was a pleasure to see you today!

## 2022-10-23 NOTE — Progress Notes (Signed)
Patient ID: Brian Williams, male    DOB: 1960-10-21, 62 y.o.   MRN: [id number]  Virtual visit completed through [employer], a video enabled telemedicine application. Due to national recommendations of social distancing due to COVID-19, a virtual visit is felt to be most appropriate for this patient at this time. Reviewed limitations, risks, security and privacy concerns of performing a virtual visit and the availability of in person appointments. I also reviewed that there may be a patient responsible charge related to this service. The patient agreed to proceed.   Patient location: home Provider location: [place] [facility], office Persons participating in this virtual visit: patient, provider   If any vitals were documented, they were collected by patient at home unless specified below.    Ht 6' (1.829 m) Comment: patient reported  Wt 260 lb (117.9 kg) Comment: patient reported  BMI 35.26 kg/m    CC: Chronic Diarrhea Subjective:   HPI: Brian Williams is a 62 y.o. male with a significant medical history including CAD, CHF, Unstable Angina, type 2 diabetes, chronic back pain, OSA, chronic diarrhea presenting on 10/23/2022 for Referral (Would like to discuss referral to GI)  Chronic history of diarrhea that dates back to at least early 2022. Hospital admission in March 2022 for acute on chronic diarrhea with Norovirus which was not suspected to be the cause for chronic diarrhea. He was treated with a 14 day course of Rifaximin for which he completed. He was also provided with a prescription for Welchol 1875 TID to use for ongoing diarrhea.   His diarrhea occurs within 20 minutes of eating any type of food and will have several occurences until the next meal. His diarrhea is watery. History of cholecystectomy since 2001.  His diarrhea predates a lot of his medications. He has an appointment with GI scheduled for May 2024, questions if he can get an evaluation sooner. His last colonoscopy  was in 2023, grossly negative except for a polyp.   He denies fevers, chills, unexplained weight loss, bloody stools. The Welchol was very effective, he only took this 1-2 times weekly as needed. He is requesting a refill until he can get in with GI in May.      Relevant past medical, surgical, family and social history reviewed and updated as indicated. Interim medical history since our last visit reviewed. Allergies and medications reviewed and updated. Outpatient Medications Prior to Visit  Medication Sig Dispense Refill   aspirin EC 81 MG tablet Take 81 mg by mouth daily.  90 tablet    cetirizine (ZYRTEC) 10 MG tablet Take 1 tablet (10 mg total) by mouth daily. For cough 90 tablet 0   clopidogrel (PLAVIX) 75 MG tablet Take 1 tablet (75 mg total) by mouth daily. 90 tablet 0   DULoxetine (CYMBALTA) 20 MG capsule Take 2 capsules (40 mg total) by mouth daily. 60 capsule 5   escitalopram (LEXAPRO) 20 MG tablet TAKE 1 TABLET BY MOUTH DAILY FOR ANXIETY AND DEPRESSION. GENERIC EQUIVALENT FOR LEXAPRO 90 tablet 2   ezetimibe (ZETIA) 10 MG tablet Take 1 tablet (10 mg total) by mouth daily. For cholesterol. 90 tablet 0   furosemide (LASIX) 40 MG tablet Take 80 mg by mouth daily.     gabapentin (NEURONTIN) 600 MG tablet TAKE TWO TABLETS BY MOUTH TWICE A DAY FOR PAIN 360 tablet 1   gemfibrozil (LOPID) 600 MG tablet TAKE 1 TABLET( '600MG'$  TOTAL) BY MOUTH DAILY FOR CHOLESTEROL Strength: 600 mg 90 tablet  1   insulin regular human CONCENTRATED (HUMULIN R) 500 UNIT/ML injection Inject 0-25 Units into the skin daily at 12 noon.     methocarbamol (ROBAXIN) 750 MG tablet Take 1 tablet (750 mg total) by mouth every 8 (eight) hours as needed for muscle spasms. 90 tablet 5   metoprolol succinate (TOPROL-XL) 100 MG 24 hr tablet Take 150 mg by mouth daily.     nitroGLYCERIN (NITROSTAT) 0.4 MG SL tablet Place 1 tablet (0.4 mg total) under the tongue every 5 (five) minutes as needed for chest pain. 25 tablet 1   omega-3  acid ethyl esters (LOVAZA) 1 g capsule Take 1 g by mouth 2 (two) times daily.     rosuvastatin (CRESTOR) 40 MG tablet Take 1 tablet (40 mg total) by mouth daily. 90 tablet 2   sacubitril-valsartan (ENTRESTO) 49-51 MG Take 1 tablet by mouth 2 (two) times daily.     Semaglutide, 2 MG/DOSE, (OZEMPIC, 2 MG/DOSE,) 8 MG/3ML SOPN Inject 2 mg into the skin every Friday.     spironolactone (ALDACTONE) 25 MG tablet Take 25 mg by mouth daily.     traZODone (DESYREL) 100 MG tablet TAKE 2 TABLETS BY MOUTH EVERY NIGHT AT BEDTIME FOR SLEEP 180 tablet 0   amoxicillin-clavulanate (AUGMENTIN) 875-125 MG tablet Take 1 tablet by mouth 2 (two) times daily. (Patient not taking: Reported on 10/23/2022) 14 tablet 0   azithromycin (ZITHROMAX) 250 MG tablet Take 2 tablets by mouth today, then 1 tablet daily for 4 additional days. (Patient not taking: Reported on 10/23/2022) 6 tablet 0   budesonide-formoterol (SYMBICORT) 160-4.5 MCG/ACT inhaler Inhale 2 puffs into the lungs 2 (two) times daily. Rinse mouth after each use (Patient not taking: Reported on 10/23/2022) 1 each 0   No facility-administered medications prior to visit.     Per HPI unless specifically indicated in ROS section below Review of Systems  Constitutional:  Negative for fever and unexpected weight change.  Gastrointestinal:  Positive for diarrhea. Negative for blood in stool and vomiting.   Objective:  Ht 6' (1.829 m) Comment: patient reported  Wt 260 lb (117.9 kg) Comment: patient reported  BMI 35.26 kg/m   Wt Readings from Last 3 Encounters:  10/23/22 260 lb (117.9 kg)  08/15/22 277 lb (125.6 kg)  07/19/22 269 lb (122 kg)       Physical exam: General: Alert and oriented x 3, no distress, does not appear sickly  Pulmonary: Speaks in complete sentences without increased work of breathing, no cough during visit.  Psychiatric: Normal mood, thought content, and behavior.     Results for orders placed or performed in visit on 84/13/24  Basic  metabolic panel  Result Value Ref Range   Sodium 141 135 - 145 mEq/L   Potassium 3.6 3.5 - 5.1 mEq/L   Chloride 103 96 - 112 mEq/L   CO2 26 19 - 32 mEq/L   Glucose, Bld 174 (H) 70 - 99 mg/dL   BUN 12 6 - 23 mg/dL   Creatinine, Ser 0.83 0.40 - 1.50 mg/dL   GFR 94.41 >60.00 mL/min   Calcium 9.9 8.4 - 10.5 mg/dL  CBC with Differential/Platelet  Result Value Ref Range   WBC 5.5 4.0 - 10.5 K/uL   RBC 3.75 (L) 4.22 - 5.81 Mil/uL   Hemoglobin 12.2 (L) 13.0 - 17.0 g/dL   HCT 35.3 (L) 39.0 - 52.0 %   MCV 94.2 78.0 - 100.0 fl   MCHC 34.7 30.0 - 36.0 g/dL   RDW 14.9  11.5 - 15.5 %   Platelets 155.0 150.0 - 400.0 K/uL   Neutrophils Relative % 41.9 (L) 43.0 - 77.0 %   Lymphocytes Relative 47.4 (H) 12.0 - 46.0 %   Monocytes Relative 5.3 3.0 - 12.0 %   Eosinophils Relative 4.9 0.0 - 5.0 %   Basophils Relative 0.5 0.0 - 3.0 %   Neutro Abs 2.3 1.4 - 7.7 K/uL   Lymphs Abs 2.6 0.7 - 4.0 K/uL   Monocytes Absolute 0.3 0.1 - 1.0 K/uL   Eosinophils Absolute 0.3 0.0 - 0.7 K/uL   Basophils Absolute 0.0 0.0 - 0.1 K/uL  Microalbumin / creatinine urine ratio  Result Value Ref Range   Microalb, Ur 2.2 (H) 0.0 - 1.9 mg/dL   Creatinine,U 155.7 mg/dL   Microalb Creat Ratio 1.4 0.0 - 30.0 mg/g   Assessment & Plan:   Problem List Items Addressed This Visit       Digestive   Chronic diarrhea - Primary    Reviewed hospital notes from March 2022. Reviewed colonoscopy from January 2023.  Agree to provide refill of Welchol, he will take 1-2 tablets daily PRN. No alarm signs during visit or on HPI.  Follow up with GI as scheduled.        Relevant Medications   colesevelam (WELCHOL) 625 MG tablet     Meds ordered this encounter  Medications   colesevelam (WELCHOL) 625 MG tablet    Sig: Take 1-2 tablets (625-1,250 mg total) by mouth daily as needed. For diarrhea.    Dispense:  180 tablet    Refill:  0    Order Specific Question:   Supervising Provider    Answer:   BEDSOLE, AMY E [[id number]]   No  orders of the defined types were placed in this encounter.   I discussed the assessment and treatment plan with the patient. The patient was provided an opportunity to ask questions and all were answered. The patient agreed with the plan and demonstrated an understanding of the instructions. The patient was advised to call back or seek an in-person evaluation if the symptoms worsen or if the condition fails to improve as anticipated.  Follow up plan:  Start the [facility] medication for diarrhea. Take 1-2 tablets daily as needed for diarrhea.  Follow up with Gi as scheduled.  It was a pleasure to see you today!   Pleas Koch, NP

## 2022-10-23 NOTE — Assessment & Plan Note (Signed)
Reviewed hospital notes from March 2022. Reviewed colonoscopy from January 2023.  Agree to provide refill of Welchol, he will take 1-2 tablets daily PRN. No alarm signs during visit or on HPI.  Follow up with GI as scheduled.

## 2022-10-29 ENCOUNTER — Encounter: Payer: Self-pay | Admitting: Student in an Organized Health Care Education/Training Program

## 2022-10-30 ENCOUNTER — Encounter: Payer: Self-pay | Admitting: *Deleted

## 2022-10-30 ENCOUNTER — Ambulatory Visit (INDEPENDENT_AMBULATORY_CARE_PROVIDER_SITE_OTHER): Payer: Medicare Other

## 2022-10-30 VITALS — Ht 72.0 in | Wt 260.0 lb

## 2022-10-30 DIAGNOSIS — Z0000 Encounter for general adult medical examination without abnormal findings: Secondary | ICD-10-CM | POA: Diagnosis not present

## 2022-10-30 DIAGNOSIS — E118 Type 2 diabetes mellitus with unspecified complications: Secondary | ICD-10-CM | POA: Diagnosis not present

## 2022-10-30 DIAGNOSIS — Z794 Long term (current) use of insulin: Secondary | ICD-10-CM | POA: Diagnosis not present

## 2022-10-30 NOTE — Progress Notes (Signed)
I connected with  Brian Williams on 10/30/22 by a audio enabled telemedicine application and verified that I am speaking with the correct person using two identifiers.  Patient Location: Home  Provider Location: Home Office  I discussed the limitations of evaluation and management by telemedicine. The patient expressed understanding and agreed to proceed.  Subjective:   Brian Williams is a 62 y.o. male who presents for Medicare Annual/Subsequent preventive examination.  Review of Systems    Cardiac Risk Factors include: advanced age (>73mn, >>33women);diabetes mellitus;dyslipidemia;hypertension;male gender;obesity (BMI >30kg/m2);sedentary lifestyle    Objective:    Today's Vitals   10/30/22 0953 10/30/22 0954  Weight: 260 lb (117.9 kg)   Height: 6' (1.829 m)   PainSc:  4    Body mass index is 35.26 kg/m.     10/30/2022   10:11 AM 07/09/2022   10:57 AM 12/27/2021   10:56 AM 10/06/2021    9:42 AM 09/06/2021    9:26 AM 08/28/2021    9:32 AM 07/29/2021   10:05 PM  Advanced Directives  Does Patient Have a Medical Advance Directive? Yes Yes Yes Yes Yes Yes Yes  Type of [place]amed[place] will  [place] will Living will Living will;Healthcare Power of [place]raz[place] will  Does patient want to make changes to medical advance directive?     No - Patient declined No - Patient declined No - Guardian declined  Copy of [place] Chart? Yes - validated most recent copy scanned in chart (See row information)    No - copy requested      Current Medications (verified) Outpatient Encounter Medications as of 10/30/2022  Medication Sig   aspirin EC 81 MG tablet Take 81 mg by mouth daily.    cetirizine (ZYRTEC) 10 MG tablet Take 1 tablet (10 mg total) by mouth daily. For cough   clopidogrel (PLAVIX) 75 MG tablet Take 1 tablet (75 mg total) by mouth daily.   colesevelam (WELCHOL)  625 MG tablet Take 1-2 tablets (625-1,250 mg total) by mouth daily as needed. For diarrhea.   DULoxetine (CYMBALTA) 20 MG capsule Take 2 capsules (40 mg total) by mouth daily.   escitalopram (LEXAPRO) 20 MG tablet TAKE 1 TABLET BY MOUTH DAILY FOR ANXIETY AND DEPRESSION. GENERIC EQUIVALENT FOR LEXAPRO   ezetimibe (ZETIA) 10 MG tablet Take 1 tablet (10 mg total) by mouth daily. For cholesterol.   furosemide (LASIX) 40 MG tablet Take 80 mg by mouth daily.   gabapentin (NEURONTIN) 600 MG tablet TAKE TWO TABLETS BY MOUTH TWICE A DAY FOR PAIN   gemfibrozil (LOPID) 600 MG tablet TAKE 1 TABLET( 600MG TOTAL) BY MOUTH DAILY FOR CHOLESTEROL Strength: 600 mg   insulin regular human CONCENTRATED (HUMULIN R) 500 UNIT/ML injection Inject 0-25 Units into the skin daily at 12 noon.   methocarbamol (ROBAXIN) 750 MG tablet Take 1 tablet (750 mg total) by mouth every 8 (eight) hours as needed for muscle spasms.   metoprolol succinate (TOPROL-XL) 100 MG 24 hr tablet Take 150 mg by mouth daily.   nitroGLYCERIN (NITROSTAT) 0.4 MG SL tablet Place 1 tablet (0.4 mg total) under the tongue every 5 (five) minutes as needed for chest pain.   omega-3 acid ethyl esters (LOVAZA) 1 g capsule Take 1 g by mouth 2 (two) times daily.   sacubitril-valsartan (ENTRESTO) 49-51 MG Take 1 tablet by mouth 2 (two) times daily.   Semaglutide, 2 MG/DOSE, (OZEMPIC, 2  MG/DOSE,) 8 MG/3ML SOPN Inject 2 mg into the skin every Friday.   spironolactone (ALDACTONE) 25 MG tablet Take 25 mg by mouth daily.   traZODone (DESYREL) 100 MG tablet TAKE 2 TABLETS BY MOUTH EVERY NIGHT AT BEDTIME FOR SLEEP   amoxicillin-clavulanate (AUGMENTIN) 875-125 MG tablet Take 1 tablet by mouth 2 (two) times daily. (Patient not taking: Reported on 10/23/2022)   azithromycin (ZITHROMAX) 250 MG tablet Take 2 tablets by mouth today, then 1 tablet daily for 4 additional days. (Patient not taking: Reported on 10/23/2022)   budesonide-formoterol (SYMBICORT) 160-4.5 MCG/ACT inhaler  Inhale 2 puffs into the lungs 2 (two) times daily. Rinse mouth after each use (Patient not taking: Reported on 10/23/2022)   rosuvastatin (CRESTOR) 40 MG tablet Take 1 tablet (40 mg total) by mouth daily.   No facility-administered encounter medications on file as of 10/30/2022.    Allergies (verified) Naproxen and Morphine and related   History: Past Medical History:  Diagnosis Date   Acute appendicitis 04/03/2021   Acute pyelonephritis 09/07/2021   AICD (automatic cardioverter/defibrillator) present    Allergy    Anxiety    takes Xanax as needed   Appendicitis    Arthritis    back,knees,right shoulder   Back pain    Bacteremia due to Klebsiella pneumoniae 09/07/2021   Cardiomyopathy ([place])    CHF (congestive heart failure) (HCC)    takes Lasix and Aldactone daily   Coronary artery disease    takes Plavix daily   Depression    takes Zoloft daily   Diabetes mellitus without complication (HCC)    Humulin R and Farxiga daily.Fasting blood sugar runs 140   GERD (gastroesophageal reflux disease)    Headache    History of bronchitis    History of colon polyps    benign   History of hiatal hernia    Hyperlipidemia    takes Fenofibrate,Crestor, and Zetia daily   Hypertension    takes Entresto and Coreg daily   MI (myocardial infarction) ([place]) 2001   Multifocal pneumonia 09/07/2021   Obesity    Peripheral neuropathy    Pneumonia    history of-last time about 14 yrs ago   PONV (postoperative nausea and vomiting)    after knee surgery 25 yrs ago b/p stayed elevated for a while   Presence of permanent cardiac pacemaker    Sepsis ([place]) 09/06/2021   Shortness of breath dyspnea    with exertion   Sleep apnea    uses CPAP nightly   Ventricular tachycardia ([place])    s/p RFCA PVCs 2013   Past Surgical History:  Procedure Laterality Date   APPENDECTOMY     BACK SURGERY     CARDIAC CATHETERIZATION     CARDIAC CATHETERIZATION Left 05/10/2016   Procedure: Left Heart Cath and  Coronary Angiography;  Surgeon: Minna Merritts, MD;  Location: [facility];  Service: Cardiovascular;  Laterality: Left;   CARDIAC DEFIBRILLATOR PLACEMENT  10/16/2007   ICD Model number 2207-36 serial number [id number]   CARDIAC ELECTROPHYSIOLOGY STUDY AND ABLATION     CHOLECYSTECTOMY  2001   COLONOSCOPY     COLONOSCOPY WITH PROPOFOL N/A 08/28/2018   Procedure: COLONOSCOPY WITH PROPOFOL;  Surgeon: Jonathon Bellows, MD;  Location[facility] END[facility]COPY;  Service: Gastroenterology;  Laterality: N/A;   COLONOSCOPY WITH PROPOFOL N/A 10/06/2021   Procedure: COLONOSCOPY WITH PROPOFOL;  Surgeon: Jonathon Bellows, MD;  Location: [facility] ENDOSCOPY;  Service: Gastroenterology;  Laterality: N/A;   CORONARY ANGIOPLASTY WITH STENT PLACEMENT  7 stents   CORONARY ARTERY BYPASS GRAFT N/A 05/24/2016   Procedure: CORONARY ARTERY BYPASS GRAFTING (CABG) x four , using left internal mammary artery and left leg greater saphenous vein harvested endoscopically;  Surgeon: Gaye Pollack, MD;  Location: [facility];  Service: Open Heart Surgery;  Laterality: N/A;   EP IMPLANTABLE DEVICE N/A 06/16/2015   Procedure: ICD Generator Changeout;  Surgeon: Deboraha Sprang, MD;  Location: [facility];  Service: Cardiovascular;  Laterality: N/A;   ESOPHAGOGASTRODUODENOSCOPY     INSERT / REPLACE / REMOVE PACEMAKER     KNEE SURGERY     bilateral knee    [name] WITHOUT CARDIOVERSION N/A 05/24/2016   Procedure: TRANSESOPHAGEAL ECHOCARDIOGRAM ([name]);  Surgeon: Gaye Pollack, MD;  Location: [facility];  Service: Open Heart Surgery;  Laterality: N/A;   Ulnar Nerve surgery Left 09/19/2022   VASECTOMY     Family History  Problem Relation Age of Onset   Heart attack Mother 5   Hypertension Mother    Melanoma Mother    Heart attack Father 89   Hypertension Father    Hypertension Maternal Uncle    Heart disease Maternal Uncle    Heart disease Maternal Grandmother    Stroke Maternal Grandmother    Diabetes Neg Hx    Social History    Socioeconomic History   Marital status: Married    Spouse name: Brian Williams   Number of children: 2   Years of education: Not on file   Highest education level: Not on file  Occupational History   Not on file  Tobacco Use   Smoking status: Former    Packs/day: 1.00    Years: 25.00    Total pack years: 25.00    Types: Cigarettes    Quit date: 05/19/2018    Years since quitting: 4.4   Smokeless tobacco: Never  Vaping Use   Vaping Use: Never used  Substance and Sexual Activity   Alcohol use: No    Comment: rare   Drug use: No   Sexual activity: Not Currently  Other Topics Concern   Not on file  Social History Narrative   Married.   Moved from [place].   Disabled.   Social Determinants of Health   Financial Resource Strain: Low Risk  (10/30/2022)   Overall Financial Resource Strain (CARDIA)    Difficulty of Paying Living Expenses: Not hard at all  Food Insecurity: No Food Insecurity (10/30/2022)   Hunger Vital Sign    Worried About Running Out of Food in the Last Year: Never true    Ran Out of Food in the Last Year: Never true  Transportation Needs: No Transportation Needs (10/30/2022)   PRAPARE - Hydrologist (Medical): No    Lack of Transportation (Non-Medical): No  Physical Activity: Inactive (10/30/2022)   Exercise Vital Sign    Days of Exercise per Week: 0 days    Minutes of Exercise per Session: 0 min  Stress: No Stress Concern Present (10/30/2022)   Brian Williams    Feeling of Stress : Not at all  Social Connections: Socially Isolated (10/30/2022)   Social Connection and Isolation Panel [NHANES]    Frequency of Communication with Friends and Family: Never    Frequency of Social Gatherings with Friends and Family: Twice a week    Attends Religious Services: Never    Marine scientist or Organizations: No    Attends  Club or Organization Meetings: Never     Marital Status: Married    Tobacco Counseling Counseling given: Not Answered   Clinical Intake:  Pre-visit preparation completed: Yes  Pain : 0-10 Pain Score: 4  Pain Type: Chronic pain Pain Location: Leg (both) Pain Radiating Towards: radiate to feet Pain Descriptors / Indicators: Aching, Burning, Shooting Pain Onset: More than a month ago Pain Frequency: Constant Pain Relieving Factors: medication  Pain Relieving Factors: medication  BMI - recorded: 35.26 Nutritional Status: BMI > 30  Obese Nutritional Risks: None Diabetes: Yes CBG done?: Yes (pt done at home:currently 335) CBG resulted in Enter/ Edit results?: No Did pt. bring in CBG monitor from home?: No (televisit)  How often do you need to have someone help you when you read instructions, pamphlets, or other written materials from your doctor or pharmacy?: 1 - Never  Diabetic?yes  Interpreter Needed?: No  Information entered by :: B.Francys Bolin,LPN   Activities of Daily Living    10/30/2022   10:11 AM  In your present state of health, do you have any difficulty performing the following activities:  Hearing? 0  Vision? 0  Difficulty concentrating or making decisions? 0  Walking or climbing stairs? 1  Comment legs neuropathy  Dressing or bathing? 0  Doing errands, shopping? 0  Preparing Food and eating ? N  Using the Toilet? N  In the past six months, have you accidently leaked urine? N  Do you have problems with loss of bowel control? N  Managing your Medications? N  Managing your Finances? N  Housekeeping or managing your Housekeeping? N    Patient Care Team: Pleas Koch, NP as PCP - General (Internal Medicine) Minna Merritts, MD as PCP - Cardiology (Cardiology) Minna Merritts, MD as Consulting Physician (Cardiology) Radene Knee Ladell Pier, MD as Consulting Physician (Cardiology) Jamelle Rushing, MD as Referring Physician (Dermatology) Charlton Haws, [facility] as Pharmacist  (Pharmacist) Bryson Ha, OD (Optometry)  Indicate any recent Medical Services you may have received from other than [facility] providers in the past year (date may be approximate).     Assessment:   This is a routine wellness examination for Brian Williams.  Hearing/Vision screen Hearing Screening - Comments:: Adequate hearing Vision Screening - Comments:: Adequate vision: [facility]  Dietary issues and exercise activities discussed: Current Exercise Habits: The patient does not participate in regular exercise at present, Exercise limited by: neurologic condition(s);psychological condition(s);cardiac condition(s)   Goals Addressed             This Visit's Progress    Patient Stated   Not on track    Would like to decrease weight to 235.      Depression Screen    10/30/2022   10:02 AM 10/23/2022   10:43 AM 07/09/2022   10:57 AM 12/27/2021   10:56 AM 07/21/2021    1:23 PM 05/25/2021   10:42 AM 02/17/2020    8:48 AM  PHQ 2/9 Scores  PHQ - 2 Score 5 5 0 0 0 0 2  PHQ- 9 Score 12 14     16    $ Fall Risk    10/30/2022    9:58 AM 10/23/2022   10:43 AM 07/09/2022   10:57 AM 04/11/2022   10:15 AM 12/27/2021   10:56 AM  Fall Risk   Falls in the past year? 1 1 0 0 0  Number falls in past yr: 0 0     Injury with Fall? 0 0  Risk for fall due to : No Fall Risks No Fall Risks     Follow up Education provided;Falls prevention discussed Falls evaluation completed       FALL RISK PREVENTION PERTAINING TO THE HOME:  Any stairs in or around the home? No  If so, are there any without handrails? No  Home free of loose throw rugs in walkways, pet beds, electrical cords, etc? Yes  Adequate lighting in your home to reduce risk of falls? Yes   ASSISTIVE DEVICES UTILIZED TO PREVENT FALLS:  Life alert? Yes  Use of a cane, walker or w/c? Yes  cane Grab bars in the bathroom? Yes  Shower chair or bench in shower? Yes  Elevated toilet seat or a handicapped toilet? Yes    Cognitive Function:      01/06/2016   10:59 AM  Montreal Cognitive Assessment   Visuospatial/ Executive (0/5) 4  Naming (0/3) 3  Attention: Read list of digits (0/2) 2  Attention: Read list of letters (0/1) 1  Attention: Serial 7 subtraction starting at 100 (0/3) 3  Language: Repeat phrase (0/2) 2  Language : Fluency (0/1) 1  Abstraction (0/2) 2  Delayed Recall (0/5) 4  Orientation (0/6) 6  Total 28  Adjusted Score (based on education) 28      10/30/2022   10:12 AM  6CIT Screen  What Year? 0 points  What month? 0 points  What time? 0 points  Count back from 20 0 points  Months in reverse 0 points  Repeat phrase 2 points  Total Score 2 points    Immunizations Immunization History  Administered Date(s) Administered   Influenza Inj Mdck Quad Pf 06/25/2018, 06/05/2019, 06/23/2021   Influenza Split 07/22/2015   Influenza,inj,Quad PF,6+ Mos 06/13/2016, 06/24/2020   Influenza-Unspecified 06/25/2018, 06/06/2019   Moderna Covid-19 Vaccine Bivalent Booster 38yr & up 06/23/2021   Moderna Sars-Covid-2 Vaccination 10/31/2019, 11/21/2019, 07/12/2020   Pfizer Covid-19 Vaccine Bivalent Booster 140yr& up 07/06/2021   Pneumococcal Polysaccharide-23 07/21/2018   Td 04/17/2018   Tdap 02/22/2018, 08/01/2021   Zoster Recombinat (Shingrix) 08/13/2020, 10/16/2020    TDAP status: Up to date  Flu Vaccine status: Up to date  Pneumococcal vaccine status: Up to date  Covid-19 vaccine status: Completed vaccines  Qualifies for Shingles Vaccine? Yes   Zostavax completed Yes   Shingrix Completed?: Yes  Screening Tests Health Maintenance  Topic Date Due   FOOT EXAM  02/03/2021   COVID-19 Vaccine (5 - 2023-24 season) 11/08/2022 (Originally 05/18/2022)   HEMOGLOBIN A1C  02/22/2023   OPHTHALMOLOGY EXAM  03/29/2023   Lung Cancer Screening  08/24/2023   Diabetic kidney evaluation - eGFR measurement  09/08/2023   Diabetic kidney evaluation - Urine ACR  09/08/2023   Medicare Annual  Wellness (AWV)  10/31/2023   COLONOSCOPY (Pts [id number] coverage will need to be confirmed)  10/06/2028   DTaP/Tdap/Td (4 - Td or Tdap) 08/02/2031   INFLUENZA VACCINE  Completed   HIV Screening  Completed   Zoster Vaccines- Shingrix  Completed   Hepatitis C Screening  Addressed   HPV VACCINES  Aged Out    Health Maintenance  Health Maintenance Due  Topic Date Due   FOOT EXAM  02/03/2021    Colorectal cancer screening: Type of screening: Colonoscopy. Completed yes. Repeat every 10 years  Lung Cancer Screening: (Low Dose CT Chest recommended if Age 69-76-80ars, 30 pack-year currently smoking OR have quit w/in 15years.) does qualify.   Lung Cancer Screening Referral: no already done  Additional Screening:  Hepatitis C Screening: does not qualify; Completed no  Vision Screening: Recommended annual ophthalmology exams for early detection of glaucoma and other disorders of the eye. Is the patient up to date with their annual eye exam?  Yes  Who is the provider or what is the name of the office in which the patient attends annual eye exams? [facility] If pt is not established with a provider, would they like to be referred to a provider to establish care? No .   Dental Screening: Recommended annual dental exams for proper oral hygiene  Community Resource Referral / Chronic Care Management: CRR required this visit?  No   CCM required this visit?  No      Plan:     I have personally reviewed and noted the following in the patient's chart:   Medical and social history Use of alcohol, tobacco or illicit drugs  Current medications and supplements including opioid prescriptions. Patient is not currently taking opioid prescriptions. Functional ability and status Nutritional status Physical activity Advanced directives List of other physicians Hospitalizations, surgeries, and ER visits in previous 12 months Vitals Screenings to include cognitive,  depression, and falls Referrals and appointments  In addition, I have reviewed and discussed with patient certain preventive protocols, quality metrics, and best practice recommendations. A written personalized care plan for preventive services as well as general preventive health recommendations were provided to patient.     Roger Shelter, LPN   [id number]   Nurse Notes: pt states he is doing alright:has no concerns or questions. I note that pt BS is 335 per his Dexcom. Pt indicates he fell asleep and forgot to load his insulin pump. Pt indicates it is usually around 150 (at tis time). Pt sees endocrinologist:declines DM nutritionist.

## 2022-10-30 NOTE — Patient Instructions (Signed)
[name] , Thank you for taking time to come for your Medicare Wellness Visit. I appreciate your ongoing commitment to your health goals. Please review the following plan we discussed and let me know if I can assist you in the future.   These are the goals we discussed:  Goals      Manage My Medicine     Timeframe:  Long-Range Goal Priority:  Medium Start Date:            04/28/21                 Expected End Date:    04/28/22                   Follow Up Date Feb 2023   - call for medicine refill 2 or 3 days before it runs out - call if I am sick and can't take my medicine - keep a list of all the medicines I take; vitamins and herbals too - use a pillbox to sort medicine  -Call Endocrine office for insulin pump adjustments   Why is this important?   These steps will help you keep on track with your medicines.   Notes:      Patient Stated     Would like to decrease weight to 235.        This is a list of the screening recommended for you and due dates:  Health Maintenance  Topic Date Due   Complete foot exam   02/03/2021   COVID-19 Vaccine (5 - 2023-24 season) 11/08/2022*   Hemoglobin A1C  02/22/2023   Eye exam for diabetics  03/29/2023   Screening for Lung Cancer  08/24/2023   Yearly kidney function blood test for diabetes  09/08/2023   Yearly kidney health urinalysis for diabetes  09/08/2023   Medicare Annual Wellness Visit  10/31/2023   Colon Cancer Screening  10/06/2028   DTaP/Tdap/Td vaccine (4 - Td or Tdap) 08/02/2031   Flu Shot  Completed   HIV Screening  Completed   Zoster (Shingles) Vaccine  Completed   Hepatitis C Screening: USPSTF Recommendation to screen - Ages 34-79 yo.  Addressed   HPV Vaccine  Aged Out  *Topic was postponed. The date shown is not the original due date.    Advanced directives: yes  Conditions/risks identified: low falls risk  Next appointment: Follow up in one year for your annual wellness visit  11/04/2023@9$ :30am/telephone  Preventive Care 40-64 Years, Male Preventive care refers to lifestyle choices and visits with your health care provider that can promote health and wellness. What does preventive care include? A yearly physical exam. This is also called an annual well check. Dental exams once or twice a year. Routine eye exams. Ask your health care provider how often you should have your eyes checked. Personal lifestyle choices, including: Daily care of your teeth and gums. Regular physical activity. Eating a healthy diet. Avoiding tobacco and drug use. Limiting alcohol use. Practicing safe sex. Taking low-dose aspirin every day starting at age 55. What happens during an annual well check? The services and screenings done by your health care provider during your annual well check will depend on your age, overall health, lifestyle risk factors, and family history of disease. Counseling  Your health care provider may ask you questions about your: Alcohol use. Tobacco use. Drug use. Emotional well-being. Home and relationship well-being. Sexual activity. Eating habits. Work and work Statistician. Screening  You may have the following tests  or measurements: Height, weight, and BMI. Blood pressure. Lipid and cholesterol levels. These may be checked every 5 years, or more frequently if you are over 44 years old. Skin check. Lung cancer screening. You may have this screening every year starting at age 41 if you have a 30-pack-year history of smoking and currently smoke or have quit within the past 15 years. Fecal occult blood test (FOBT) of the stool. You may have this test every year starting at age 27. Flexible sigmoidoscopy or colonoscopy. You may have a sigmoidoscopy every 5 years or a colonoscopy every 10 years starting at age 4. Prostate cancer screening. Recommendations will vary depending on your family history and other risks. Hepatitis C blood test. Hepatitis B  blood test. Sexually transmitted disease (STD) testing. Diabetes screening. This is done by checking your blood sugar (glucose) after you have not eaten for a while (fasting). You may have this done every 1-3 years. Discuss your test results, treatment options, and if necessary, the need for more tests with your health care provider. Vaccines  Your health care provider may recommend certain vaccines, such as: Influenza vaccine. This is recommended every year. Tetanus, diphtheria, and acellular pertussis (Tdap, Td) vaccine. You may need a Td booster every 10 years. Zoster vaccine. You may need this after age 2. Pneumococcal 13-valent conjugate (PCV13) vaccine. You may need this if you have certain conditions and have not been vaccinated. Pneumococcal polysaccharide (PPSV23) vaccine. You may need one or two doses if you smoke cigarettes or if you have certain conditions. Talk to your health care provider about which screenings and vaccines you need and how often you need them. This information is not intended to replace advice given to you by your health care provider. Make sure you discuss any questions you have with your health care provider. Document Released: 09/30/2015 Document Revised: 05/23/2016 Document Reviewed: 07/05/2015 Elsevier Interactive Patient Education  2017 [place] Prevention in the Home Falls can cause injuries. They can happen to people of all ages. There are many things you can do to make your home safe and to help prevent falls. What can I do on the outside of my home? Regularly fix the edges of walkways and driveways and fix any cracks. Remove anything that might make you trip as you walk through a door, such as a raised step or threshold. Trim any bushes or trees on the path to your home. Use bright outdoor lighting. Clear any walking paths of anything that might make someone trip, such as rocks or tools. Regularly check to see if handrails are loose or  broken. Make sure that both sides of any steps have handrails. Any raised decks and porches should have guardrails on the edges. Have any leaves, snow, or ice cleared regularly. Use sand or salt on walking paths during winter. Clean up any spills in your garage right away. This includes oil or grease spills. What can I do in the bathroom? Use night lights. Install grab bars by the toilet and in the tub and shower. Do not use towel bars as grab bars. Use non-skid mats or decals in the tub or shower. If you need to sit down in the shower, use a plastic, non-slip stool. Keep the floor dry. Clean up any water that spills on the floor as soon as it happens. Remove soap buildup in the tub or shower regularly. Attach bath mats securely with double-sided non-slip rug tape. Do not have throw rugs and other things  on the floor that can make you trip. What can I do in the bedroom? Use night lights. Make sure that you have a light by your bed that is easy to reach. Do not use any sheets or blankets that are too big for your bed. They should not hang down onto the floor. Have a firm chair that has side arms. You can use this for support while you get dressed. Do not have throw rugs and other things on the floor that can make you trip. What can I do in the kitchen? Clean up any spills right away. Avoid walking on wet floors. Keep items that you use a lot in easy-to-reach places. If you need to reach something above you, use a strong step stool that has a grab bar. Keep electrical cords out of the way. Do not use floor polish or wax that makes floors slippery. If you must use wax, use non-skid floor wax. Do not have throw rugs and other things on the floor that can make you trip. What can I do with my stairs? Do not leave any items on the stairs. Make sure that there are handrails on both sides of the stairs and use them. Fix handrails that are broken or loose. Make sure that handrails are as long as  the stairways. Check any carpeting to make sure that it is firmly attached to the stairs. Fix any carpet that is loose or worn. Avoid having throw rugs at the top or bottom of the stairs. If you do have throw rugs, attach them to the floor with carpet tape. Make sure that you have a light switch at the top of the stairs and the bottom of the stairs. If you do not have them, ask someone to add them for you. What else can I do to help prevent falls? Wear shoes that: Do not have high heels. Have rubber bottoms. Are comfortable and fit you well. Are closed at the toe. Do not wear sandals. If you use a stepladder: Make sure that it is fully opened. Do not climb a closed stepladder. Make sure that both sides of the stepladder are locked into place. Ask someone to hold it for you, if possible. Clearly mark and make sure that you can see: Any grab bars or handrails. First and last steps. Where the edge of each step is. Use tools that help you move around (mobility aids) if they are needed. These include: Canes. Walkers. Scooters. Crutches. Turn on the lights when you go into a dark area. Replace any light bulbs as soon as they burn out. Set up your furniture so you have a clear path. Avoid moving your furniture around. If any of your floors are uneven, fix them. If there are any pets around you, be aware of where they are. Review your medicines with your doctor. Some medicines can make you feel dizzy. This can increase your chance of falling. Ask your doctor what other things that you can do to help prevent falls. This information is not intended to replace advice given to you by your health care provider. Make sure you discuss any questions you have with your health care provider. Document Released: 06/30/2009 Document Revised: 02/09/2016 Document Reviewed: 10/08/2014 Elsevier Interactive Patient Education  2017 [employer].

## 2022-11-05 ENCOUNTER — Encounter: Payer: Self-pay | Admitting: *Deleted

## 2022-11-06 ENCOUNTER — Ambulatory Visit (INDEPENDENT_AMBULATORY_CARE_PROVIDER_SITE_OTHER): Payer: Medicare Other

## 2022-11-06 DIAGNOSIS — I442 Atrioventricular block, complete: Secondary | ICD-10-CM | POA: Diagnosis not present

## 2022-11-06 LAB — CUP PACEART REMOTE DEVICE CHECK
Battery Remaining Longevity: 29 mo
Battery Remaining Percentage: 31 %
Battery Voltage: 2.84 V
Brady Statistic AP VP Percent: 1 %
Brady Statistic AP VS Percent: 4.1 %
Brady Statistic AS VP Percent: 24 %
Brady Statistic AS VS Percent: 70 %
Brady Statistic RA Percent Paced: 3.9 %
Brady Statistic RV Percent Paced: 25 %
Date Time Interrogation Session: 20240220074857
HighPow Impedance: 47 Ohm
HighPow Impedance: 47 Ohm
Implantable Lead Connection Status: 753985
Implantable Lead Connection Status: 753985
Implantable Lead Implant Date: 20160929
Implantable Lead Implant Date: 20160929
Implantable Lead Location: 753859
Implantable Lead Location: 753860
Implantable Lead Model: 7071
Implantable Pulse Generator Implant Date: 20160929
Lead Channel Impedance Value: 350 Ohm
Lead Channel Impedance Value: 450 Ohm
Lead Channel Pacing Threshold Amplitude: 0.75 V
Lead Channel Pacing Threshold Amplitude: 0.75 V
Lead Channel Pacing Threshold Pulse Width: 0.4 ms
Lead Channel Pacing Threshold Pulse Width: 0.5 ms
Lead Channel Sensing Intrinsic Amplitude: 11.4 mV
Lead Channel Sensing Intrinsic Amplitude: 4 mV
Lead Channel Setting Pacing Amplitude: 2 V
Lead Channel Setting Pacing Amplitude: 2.5 V
Lead Channel Setting Pacing Pulse Width: 0.5 ms
Lead Channel Setting Sensing Sensitivity: 0.5 mV
Pulse Gen Serial Number: 7306375

## 2022-11-07 ENCOUNTER — Other Ambulatory Visit: Payer: Self-pay | Admitting: Primary Care

## 2022-11-07 DIAGNOSIS — E785 Hyperlipidemia, unspecified: Secondary | ICD-10-CM

## 2022-11-08 ENCOUNTER — Ambulatory Visit: Payer: Medicare Other | Admitting: Primary Care

## 2022-11-12 ENCOUNTER — Telehealth: Payer: Self-pay | Admitting: Student in an Organized Health Care Education/Training Program

## 2022-11-12 NOTE — Telephone Encounter (Signed)
Patient is calling to get a referral to [facility]. He can get the [place] Treatment done at a fraction fo the cost it is here $1500. But they need a referral.

## 2022-11-12 NOTE — Telephone Encounter (Signed)
Spoke with Brian Williams prior to calling patient. Patient needs to ask for referral  from PCP if he wants to go to [facility] for treatment. Called patient. No answer. LVM.

## 2022-11-13 ENCOUNTER — Telehealth: Payer: Self-pay | Admitting: Primary Care

## 2022-11-13 DIAGNOSIS — E1142 Type 2 diabetes mellitus with diabetic polyneuropathy: Secondary | ICD-10-CM

## 2022-11-13 NOTE — Telephone Encounter (Signed)
Sure, happy to help. Can we get some additional information?  What is he wanting addressed by pain management? Which location? [place] or [place]?

## 2022-11-13 NOTE — Telephone Encounter (Signed)
Pt called stating he needed a referral to a pain management specialist? Call back # [phone]

## 2022-11-13 NOTE — Telephone Encounter (Signed)
Unable to reach patient. Left voicemail to return call to our office.   

## 2022-11-13 NOTE — Telephone Encounter (Signed)
Called and spoke with patient, he has been getting Qutenza treatments for the neuropathy in his feet from Dr. Elmon Else office. These visits have been costing him a lot out of pocket with Dr. Elmon Else office and confirmed that he can get them done there at a fraction of the cost with [facility]. He would like the referral sent to [facility] pain management in [place].

## 2022-11-14 ENCOUNTER — Ambulatory Visit (INDEPENDENT_AMBULATORY_CARE_PROVIDER_SITE_OTHER): Payer: Medicare Other | Admitting: Family Medicine

## 2022-11-14 ENCOUNTER — Encounter: Payer: Self-pay | Admitting: Family Medicine

## 2022-11-14 VITALS — BP 136/70 | HR 105 | Temp 97.1°F | Ht 72.0 in | Wt 265.0 lb

## 2022-11-14 DIAGNOSIS — N5089 Other specified disorders of the male genital organs: Secondary | ICD-10-CM

## 2022-11-14 DIAGNOSIS — M79604 Pain in right leg: Secondary | ICD-10-CM

## 2022-11-14 DIAGNOSIS — I5022 Chronic systolic (congestive) heart failure: Secondary | ICD-10-CM

## 2022-11-14 DIAGNOSIS — R5381 Other malaise: Secondary | ICD-10-CM

## 2022-11-14 DIAGNOSIS — E1142 Type 2 diabetes mellitus with diabetic polyneuropathy: Secondary | ICD-10-CM

## 2022-11-14 DIAGNOSIS — G6289 Other specified polyneuropathies: Secondary | ICD-10-CM | POA: Diagnosis not present

## 2022-11-14 DIAGNOSIS — R278 Other lack of coordination: Secondary | ICD-10-CM

## 2022-11-14 DIAGNOSIS — F331 Major depressive disorder, recurrent, moderate: Secondary | ICD-10-CM

## 2022-11-14 DIAGNOSIS — K529 Noninfective gastroenteritis and colitis, unspecified: Secondary | ICD-10-CM

## 2022-11-14 DIAGNOSIS — R5382 Chronic fatigue, unspecified: Secondary | ICD-10-CM

## 2022-11-14 DIAGNOSIS — M791 Myalgia, unspecified site: Secondary | ICD-10-CM

## 2022-11-14 DIAGNOSIS — M79605 Pain in left leg: Secondary | ICD-10-CM | POA: Diagnosis not present

## 2022-11-14 DIAGNOSIS — G894 Chronic pain syndrome: Secondary | ICD-10-CM

## 2022-11-14 LAB — TSH: TSH: 1.92 u[IU]/mL (ref 0.35–5.50)

## 2022-11-14 LAB — CBC WITH DIFFERENTIAL/PLATELET
Basophils Absolute: 0 10*3/uL (ref 0.0–0.1)
Basophils Relative: 0.3 % (ref 0.0–3.0)
Eosinophils Absolute: 0.4 10*3/uL (ref 0.0–0.7)
Eosinophils Relative: 4 % (ref 0.0–5.0)
HCT: 38.7 % — ABNORMAL LOW (ref 39.0–52.0)
Hemoglobin: 13.3 g/dL (ref 13.0–17.0)
Lymphocytes Relative: 46.7 % — ABNORMAL HIGH (ref 12.0–46.0)
Lymphs Abs: 4.1 10*3/uL — ABNORMAL HIGH (ref 0.7–4.0)
MCHC: 34.3 g/dL (ref 30.0–36.0)
MCV: 93.1 fl (ref 78.0–100.0)
Monocytes Absolute: 0.4 10*3/uL (ref 0.1–1.0)
Monocytes Relative: 4.5 % (ref 3.0–12.0)
Neutro Abs: 3.9 10*3/uL (ref 1.4–7.7)
Neutrophils Relative %: 44.5 % (ref 43.0–77.0)
Platelets: 168 10*3/uL (ref 150.0–400.0)
RBC: 4.15 Mil/uL — ABNORMAL LOW (ref 4.22–5.81)
RDW: 13.7 % (ref 11.5–15.5)
WBC: 8.9 10*3/uL (ref 4.0–10.5)

## 2022-11-14 LAB — COMPREHENSIVE METABOLIC PANEL
ALT: 30 U/L (ref 0–53)
AST: 28 U/L (ref 0–37)
Albumin: 4.3 g/dL (ref 3.5–5.2)
Alkaline Phosphatase: 47 U/L (ref 39–117)
BUN: 19 mg/dL (ref 6–23)
CO2: 21 mEq/L (ref 19–32)
Calcium: 10.3 mg/dL (ref 8.4–10.5)
Chloride: 105 mEq/L (ref 96–112)
Creatinine, Ser: 1.01 mg/dL (ref 0.40–1.50)
GFR: 80.12 mL/min (ref >60.00)
Glucose, Bld: 141 mg/dL — ABNORMAL HIGH (ref 70–99)
Potassium: 3.8 mEq/L (ref 3.5–5.1)
Sodium: 137 mEq/L (ref 135–145)
Total Bilirubin: 0.4 mg/dL (ref 0.2–1.2)
Total Protein: 6.8 g/dL (ref 6.0–8.3)

## 2022-11-14 LAB — SEDIMENTATION RATE: Sed Rate: 18 mm/hr (ref 0–20)

## 2022-11-14 LAB — MAGNESIUM: Magnesium: 1.6 mg/dL (ref 1.5–2.5)

## 2022-11-14 LAB — CK: Total CK: 34 U/L (ref 7–232)

## 2022-11-14 NOTE — Patient Instructions (Signed)
Labs today Pick up stool test for C diff infection  Schedule scrotal ultrasound - we will call you for this at [facility] in [place] We will be in touch with results

## 2022-11-14 NOTE — Progress Notes (Unsigned)
Patient ID: Brian Williams, male    DOB: June 01, 1961, 62 y.o.   MRN: [id number]  This visit was conducted in person.  BP 136/70   Pulse (!) 105   Temp (!) 97.1 F (36.2 C) (Temporal)   Ht 6' (1.829 m)   Wt 265 lb (120.2 kg)   SpO2 97%   BMI 35.94 kg/m    CC: fatigue, nausea "I feel miserable" Subjective:   HPI: Brian Williams is a 62 y.o. male presenting on 11/14/2022 for Fatigue (C/o feeling tired and nausea. Sxs started about 2 wks ago. Also, c/o R swollen testicle for >1 mo. Pt accompanied by wife, Lanelle Bal. )   Brian Williams is a patient of Dr Tawni Millers NP new to me who has a past medical history of Acute appendicitis (04/03/2021), Acute pyelonephritis (09/07/2021), AICD (automatic cardioverter/defibrillator) present, Allergy, Anxiety, Appendicitis, Arthritis, Back pain, Bacteremia due to Klebsiella pneumoniae (09/07/2021), Cardiomyopathy ([place]), CHF (congestive heart failure) ([place]), Coronary artery disease, Depression, Diabetes mellitus without complication ([place]), GERD (gastroesophageal reflux disease), Headache, History of bronchitis, History of colon polyps, History of hiatal hernia, Hyperlipidemia, Hypertension, MI (myocardial infarction) ([place]) (2001), Multifocal pneumonia (09/07/2021), Obesity, Peripheral neuropathy, Pneumonia, PONV (postoperative nausea and vomiting), Presence of permanent cardiac pacemaker, Sepsis ([place]) (09/06/2021), Shortness of breath dyspnea, Sleep apnea, and Ventricular tachycardia ([place]).   "I feel miserable" for several weeks. Fatigue, malaise, no appetite. Dizzy this morning "unstable" when he woke up this morning - no room spinning vertigo.  Severe neuropathy managed by [facility] pain clinic on Qutenza patches (capsaisin) Q3 mo treatment - he's planning to go to [facility] pain for eval.  He feels severe neuropathy is contributing to malaise - as it's affecting sleep. Neuropathy managed with cymbalta '40mg'$  daily, gabapentin '1200mg'$  bid. Describes sharp stabbing pains from  calves to toes, muscle spasms to calves, worse pain when supine, painful paresthesias - sharp stabbing burning pain. No numbness to feet.   Notes intermittent chills, cough since September, chronic dyspnea "since heart attack 2001", ongoing nausea with infrequent vomiting. Chronic diarrhea - see above. Chronic knee pain.   No fevers, chest pain, night sweats, unintentional weight loss, abd pain, constipation, blood in stool, black tarry stools, blood in urine, body aches, myalgias.   Notes oxycodone has been very helpful for pain.  H/o chronic diarrhea for 2.5 yrs, describes watery diarrhea x4-5, most recently started on welchol '625mg'$  BID PRN diarrhea with benefit. Has not been tested for C diff infection. Has GI eval planned 01/2023 (through [facility]).   CAD s/p CABG 05/2016, CHF with AICD in place - sees Dr Rockey Situ as well as [facility] cardiology last seen 08/2022.  Known diabetic on insulin U-500 pump and ozempic '2mg'$  weekly sees endo Dr Osker Mason at [facility].  Lab Results  Component Value Date   HGBA1C 7.8 (H) 08/23/2022    By the way 1 month h/o R testicular swelling present without pain but can be uncomfortable due to size.     Relevant past medical, surgical, family and social history reviewed and updated as indicated. Interim medical history since our last visit reviewed. Allergies and medications reviewed and updated. Outpatient Medications Prior to Visit  Medication Sig Dispense Refill   aspirin EC 81 MG tablet Take 81 mg by mouth daily.  90 tablet    cetirizine (ZYRTEC) 10 MG tablet Take 1 tablet (10 mg total) by mouth daily. For cough 90 tablet 0   clopidogrel (PLAVIX) 75 MG tablet Take 1 tablet (75 mg total) by  mouth daily. 90 tablet 0   DULoxetine (CYMBALTA) 20 MG capsule Take 2 capsules (40 mg total) by mouth daily. 60 capsule 5   escitalopram (LEXAPRO) 20 MG tablet TAKE 1 TABLET BY MOUTH DAILY FOR ANXIETY AND DEPRESSION. GENERIC EQUIVALENT FOR LEXAPRO 90 tablet 2   ezetimibe (ZETIA)  10 MG tablet Take 1 tablet (10 mg total) by mouth daily. For cholesterol. 90 tablet 0   furosemide (LASIX) 40 MG tablet Take 80 mg by mouth daily.     gabapentin (NEURONTIN) 600 MG tablet TAKE TWO TABLETS BY MOUTH TWICE A DAY FOR PAIN 360 tablet 1   gemfibrozil (LOPID) 600 MG tablet TAKE 1 TABLET BY MOUTH DAILY FOR CHOLESTEROL 90 tablet 0   insulin regular human CONCENTRATED (HUMULIN R) 500 UNIT/ML injection Inject 0-25 Units into the skin daily at 12 noon.     metoprolol succinate (TOPROL-XL) 100 MG 24 hr tablet Take 150 mg by mouth daily.     nitroGLYCERIN (NITROSTAT) 0.4 MG SL tablet Place 1 tablet (0.4 mg total) under the tongue every 5 (five) minutes as needed for chest pain. 25 tablet 1   omega-3 acid ethyl esters (LOVAZA) 1 g capsule Take 1 g by mouth 2 (two) times daily.     rosuvastatin (CRESTOR) 40 MG tablet Take 1 tablet (40 mg total) by mouth daily. 90 tablet 2   sacubitril-valsartan (ENTRESTO) 49-51 MG Take 1 tablet by mouth 2 (two) times daily.     Semaglutide, 2 MG/DOSE, (OZEMPIC, 2 MG/DOSE,) 8 MG/3ML SOPN Inject 2 mg into the skin every Friday.     spironolactone (ALDACTONE) 25 MG tablet Take 25 mg by mouth daily.     traZODone (DESYREL) 100 MG tablet TAKE 2 TABLETS BY MOUTH EVERY NIGHT AT BEDTIME FOR SLEEP 180 tablet 0   methocarbamol (ROBAXIN) 750 MG tablet Take 1 tablet (750 mg total) by mouth every 8 (eight) hours as needed for muscle spasms. 90 tablet 5   amoxicillin-clavulanate (AUGMENTIN) 875-125 MG tablet Take 1 tablet by mouth 2 (two) times daily. (Patient not taking: Reported on 10/23/2022) 14 tablet 0   azithromycin (ZITHROMAX) 250 MG tablet Take 2 tablets by mouth today, then 1 tablet daily for 4 additional days. (Patient not taking: Reported on 10/23/2022) 6 tablet 0   budesonide-formoterol (SYMBICORT) 160-4.5 MCG/ACT inhaler Inhale 2 puffs into the lungs 2 (two) times daily. Rinse mouth after each use (Patient not taking: Reported on 10/23/2022) 1 each 0   colesevelam  (WELCHOL) 625 MG tablet Take 1-2 tablets (625-1,250 mg total) by mouth daily as needed. For diarrhea. 180 tablet 0   No facility-administered medications prior to visit.     Per HPI unless specifically indicated in ROS section below Review of Systems  Objective:  BP 136/70   Pulse (!) 105   Temp (!) 97.1 F (36.2 C) (Temporal)   Ht 6' (1.829 m)   Wt 265 lb (120.2 kg)   SpO2 97%   BMI 35.94 kg/m   Wt Readings from Last 3 Encounters:  11/14/22 265 lb (120.2 kg)  10/30/22 260 lb (117.9 kg)  10/23/22 260 lb (117.9 kg)      Physical Exam      Assessment & Plan:   Problem List Items Addressed This Visit     Myalgia   Relevant Orders   CK   Chronic diarrhea   Relevant Orders   C. difficile GDH and Toxin A/B   Other Visit Diagnoses     Testicular swelling, right    -  Primary   Relevant Orders   US Scrotum   Malaise       Relevant Orders   CK   Magnesium   Comprehensive metabolic panel   CBC with Differential/Platelet   TSH   Vitamin B1   Sedimentation rate   ANA   Serum protein electrophoresis with reflex   Other polyneuropathy       Relevant Orders   Magnesium   Comprehensive metabolic panel   CBC with Differential/Platelet   TSH   Vitamin B1   Sedimentation rate   ANA   Serum protein electrophoresis with reflex   Pain in both lower extremities       Relevant Orders   CK        No orders of the defined types were placed in this encounter.   Orders Placed This Encounter  Procedures   US Scrotum    Standing Status:   Future    Standing Expiration Date:   11/15/2023    Order Specific Question:   Reason for Exam (SYMPTOM  OR DIAGNOSIS REQUIRED)    Answer:   painless R testicular swelling x 1 month    Order Specific Question:   Preferred imaging location?    Answer:   [facility]   CK   C. difficile GDH and Toxin A/B   Magnesium   Comprehensive metabolic panel   CBC with Differential/Platelet   TSH   Vitamin B1   Sedimentation rate    ANA   Serum protein electrophoresis with reflex    There are no Patient Instructions on file for this visit.  Follow up plan: No follow-ups on file.  Ria Bush, MD

## 2022-11-14 NOTE — Addendum Note (Signed)
Addended by: Pleas Koch on: 11/14/2022 03:09 PM   Modules accepted: Orders

## 2022-11-14 NOTE — Telephone Encounter (Signed)
Noted, referral placed to [facility] pain management in [place].

## 2022-11-15 ENCOUNTER — Other Ambulatory Visit: Payer: Self-pay | Admitting: Family Medicine

## 2022-11-15 ENCOUNTER — Encounter: Payer: Self-pay | Admitting: Family Medicine

## 2022-11-15 DIAGNOSIS — N5089 Other specified disorders of the male genital organs: Secondary | ICD-10-CM | POA: Insufficient documentation

## 2022-11-15 MED ORDER — MAGNESIUM 250 MG PO TABS: 1.0000 | ORAL_TABLET | Freq: Every day | ORAL | 0 refills | Status: DC

## 2022-11-15 NOTE — Assessment & Plan Note (Addendum)
On cymbalta '40mg'$  (through PM&R), lexapro '20mg'$ , trazodone '200mg'$  nightly.  Caution with serotonin excess. If mood controlled, consider tapering off lexapro, titrating cymbalta. Consider transitioning from trazodone to TCA like nortriptyline which may provide benefit to neuropathic pain (he has AICD in place).

## 2022-11-15 NOTE — Assessment & Plan Note (Addendum)
He's not been taking welchol.  Describes chronic diarrhea for 2.5 yrs, pending [facility] GI eval May 2024.  With recent abx use (augmentin/azithromycin, doxycycline), will r/o C diff colitis.

## 2022-11-15 NOTE — Assessment & Plan Note (Signed)
Describes neuropathy (burning paresthesias without numbness) as well as myopathy (sharp stabbing pain from calves to toes, muscle spasms and cramping worse when supine).  Strong pedal pulses points against arterial insufficiency.  He does have remote h/o back surgery, no details available on chart review. See above - check CPK, electrolytes.  He notes opiate has previously been helpful for leg pains.

## 2022-11-15 NOTE — Assessment & Plan Note (Addendum)
Start with labwork as per below to r/o reversible cause.  Do anticipate poor sleep due to chronic pain and neuropathy contributing.

## 2022-11-15 NOTE — Assessment & Plan Note (Signed)
Followed by cardiology. Weight up 5 lbs since last visit.  Continues entresto, lasix '80mg'$  daily, spironolactone '25mg'$  daily.  Not on potassium supplementation - update K, Mg.

## 2022-11-15 NOTE — Assessment & Plan Note (Addendum)
Carries this diagnosis, on cymbalta '40mg'$ , gabapentin '1200mg'$  bid and now seeing pain clinic for capsaicin patch applications. Has tried and failed ALA, lidocaine patches.  I don't see where he's had NCS/EMG done - consider to further elucidate symptoms.  Check labs today r/o contributing cause of neuropathy.  Consider TCA.  Pending labs, consider topical compounded neuropathy cream.

## 2022-11-15 NOTE — Assessment & Plan Note (Signed)
Neuropathy and knee osteoarthritis contributes to gait unsteadiness.

## 2022-11-15 NOTE — Assessment & Plan Note (Addendum)
He has been seeing pain clinic, has recently received Qutenza patches at [facility] pain but unaffordable - pending establishing with [facility] pain clinic.  Notes opiate has been most effective for pain control.  Noted allergy to naprosyn (causes hyperactivity).

## 2022-11-15 NOTE — Assessment & Plan Note (Addendum)
On statin, fibrate, lovaza and zetia, with persistently elevated trig - ?compliance. Check CPK r/o anti-lipidemic myopathy.  On daily lasix diuretic - check lytes r/o low potassium, magnesium contributing.

## 2022-11-15 NOTE — Assessment & Plan Note (Addendum)
Noted over the past month, largely asymptomatic. Exam suspicious for hydrocele - check scrotal US.  No associated hernia/adenopathy.

## 2022-11-16 ENCOUNTER — Encounter: Payer: Self-pay | Admitting: *Deleted

## 2022-11-17 LAB — PROTEIN ELECTROPHORESIS, SERUM, WITH REFLEX
Albumin ELP: 4.3 g/dL (ref 3.8–4.8)
Alpha 1: 0.3 g/dL (ref 0.2–0.3)
Alpha 2: 1 g/dL — ABNORMAL HIGH (ref 0.5–0.9)
Beta 2: 0.3 g/dL (ref 0.2–0.5)
Beta Globulin: 0.5 g/dL (ref 0.4–0.6)
Gamma Globulin: 0.4 g/dL — ABNORMAL LOW (ref 0.8–1.7)
Total Protein: 6.8 g/dL (ref 6.1–8.1)

## 2022-11-17 LAB — ANA: Anti Nuclear Antibody (ANA): NEGATIVE

## 2022-11-18 LAB — VITAMIN B1: Vitamin B1 (Thiamine): 9 nmol/L (ref 8–30)

## 2022-11-24 ENCOUNTER — Other Ambulatory Visit: Payer: Self-pay | Admitting: Family Medicine

## 2022-11-24 DIAGNOSIS — D801 Nonfamilial hypogammaglobulinemia: Secondary | ICD-10-CM | POA: Insufficient documentation

## 2022-11-27 ENCOUNTER — Other Ambulatory Visit: Payer: Medicare Other

## 2022-12-06 NOTE — Progress Notes (Signed)
Remote ICD transmission.   

## 2022-12-10 DIAGNOSIS — M170 Bilateral primary osteoarthritis of knee: Secondary | ICD-10-CM | POA: Diagnosis not present

## 2022-12-11 ENCOUNTER — Encounter (INDEPENDENT_AMBULATORY_CARE_PROVIDER_SITE_OTHER): Payer: Medicare Other

## 2022-12-11 DIAGNOSIS — G894 Chronic pain syndrome: Secondary | ICD-10-CM | POA: Diagnosis not present

## 2022-12-11 DIAGNOSIS — M1710 Unilateral primary osteoarthritis, unspecified knee: Secondary | ICD-10-CM | POA: Diagnosis not present

## 2022-12-11 DIAGNOSIS — E119 Type 2 diabetes mellitus without complications: Secondary | ICD-10-CM | POA: Diagnosis not present

## 2022-12-11 DIAGNOSIS — G4700 Insomnia, unspecified: Secondary | ICD-10-CM

## 2022-12-11 DIAGNOSIS — M7660 Achilles tendinitis, unspecified leg: Secondary | ICD-10-CM | POA: Diagnosis not present

## 2022-12-11 DIAGNOSIS — E669 Obesity, unspecified: Secondary | ICD-10-CM | POA: Diagnosis not present

## 2022-12-11 DIAGNOSIS — Z87891 Personal history of nicotine dependence: Secondary | ICD-10-CM | POA: Diagnosis not present

## 2022-12-11 DIAGNOSIS — I255 Ischemic cardiomyopathy: Secondary | ICD-10-CM | POA: Diagnosis not present

## 2022-12-11 DIAGNOSIS — Z9989 Dependence on other enabling machines and devices: Secondary | ICD-10-CM | POA: Diagnosis not present

## 2022-12-11 DIAGNOSIS — I2510 Atherosclerotic heart disease of native coronary artery without angina pectoris: Secondary | ICD-10-CM | POA: Diagnosis not present

## 2022-12-11 DIAGNOSIS — I440 Atrioventricular block, first degree: Secondary | ICD-10-CM | POA: Diagnosis not present

## 2022-12-11 DIAGNOSIS — M109 Gout, unspecified: Secondary | ICD-10-CM | POA: Diagnosis not present

## 2022-12-11 DIAGNOSIS — E785 Hyperlipidemia, unspecified: Secondary | ICD-10-CM | POA: Diagnosis not present

## 2022-12-11 DIAGNOSIS — Z6835 Body mass index (BMI) 35.0-35.9, adult: Secondary | ICD-10-CM | POA: Diagnosis not present

## 2022-12-11 DIAGNOSIS — I5022 Chronic systolic (congestive) heart failure: Secondary | ICD-10-CM | POA: Diagnosis not present

## 2022-12-11 DIAGNOSIS — Z79899 Other long term (current) drug therapy: Secondary | ICD-10-CM | POA: Diagnosis not present

## 2022-12-11 DIAGNOSIS — Z7985 Long-term (current) use of injectable non-insulin antidiabetic drugs: Secondary | ICD-10-CM | POA: Diagnosis not present

## 2022-12-11 DIAGNOSIS — G4733 Obstructive sleep apnea (adult) (pediatric): Secondary | ICD-10-CM | POA: Diagnosis not present

## 2022-12-11 DIAGNOSIS — Z951 Presence of aortocoronary bypass graft: Secondary | ICD-10-CM | POA: Diagnosis not present

## 2022-12-11 DIAGNOSIS — I10 Essential (primary) hypertension: Secondary | ICD-10-CM | POA: Diagnosis not present

## 2022-12-12 DIAGNOSIS — I5022 Chronic systolic (congestive) heart failure: Secondary | ICD-10-CM | POA: Diagnosis not present

## 2022-12-13 MED ORDER — AMITRIPTYLINE HCL 25 MG PO TABS: 25.0000 mg | ORAL_TABLET | Freq: Every day | ORAL | 0 refills | Status: DC

## 2022-12-13 NOTE — Telephone Encounter (Signed)
Please see the MyChart message reply(ies) for my assessment and plan.  The patient gave consent for this Medical Advice Message and is aware that it may result in a bill to their insurance company as well as the possibility that this may result in a co-payment or deductible. They are an established patient, but are not seeking medical advice exclusively about a problem treated during an in person or video visit in the last 7 days. I did not recommend an in person or video visit within 7 days of my reply.  I spent a total of 15 minutes cumulative time within 7 days through MyChart messaging Thomasenia Dowse K Sheryn Aldaz, NP  

## 2022-12-14 ENCOUNTER — Telehealth: Payer: Self-pay

## 2022-12-14 NOTE — Telephone Encounter (Signed)
Patient doc from [facility] wants to know if we can send the patients latest transmission to them. Fax # [phone] and the direct number is [phone] in case there are any questions the nurse name is Morey Hummingbird

## 2022-12-17 NOTE — Telephone Encounter (Signed)
Left message requesting call back in regards to request for records.

## 2022-12-18 ENCOUNTER — Encounter: Payer: Self-pay | Admitting: Primary Care

## 2022-12-18 ENCOUNTER — Ambulatory Visit (INDEPENDENT_AMBULATORY_CARE_PROVIDER_SITE_OTHER): Payer: Medicare Other | Admitting: Primary Care

## 2022-12-18 VITALS — BP 124/68 | HR 74 | Temp 97.4°F | Ht 72.0 in | Wt 263.0 lb

## 2022-12-18 DIAGNOSIS — Z125 Encounter for screening for malignant neoplasm of prostate: Secondary | ICD-10-CM | POA: Diagnosis not present

## 2022-12-18 DIAGNOSIS — F411 Generalized anxiety disorder: Secondary | ICD-10-CM

## 2022-12-18 DIAGNOSIS — I5022 Chronic systolic (congestive) heart failure: Secondary | ICD-10-CM | POA: Diagnosis not present

## 2022-12-18 DIAGNOSIS — I10 Essential (primary) hypertension: Secondary | ICD-10-CM

## 2022-12-18 DIAGNOSIS — E1142 Type 2 diabetes mellitus with diabetic polyneuropathy: Secondary | ICD-10-CM

## 2022-12-18 DIAGNOSIS — Z0000 Encounter for general adult medical examination without abnormal findings: Secondary | ICD-10-CM

## 2022-12-18 DIAGNOSIS — M545 Low back pain, unspecified: Secondary | ICD-10-CM

## 2022-12-18 DIAGNOSIS — I420 Dilated cardiomyopathy: Secondary | ICD-10-CM | POA: Diagnosis not present

## 2022-12-18 DIAGNOSIS — G8929 Other chronic pain: Secondary | ICD-10-CM

## 2022-12-18 DIAGNOSIS — E785 Hyperlipidemia, unspecified: Secondary | ICD-10-CM

## 2022-12-18 DIAGNOSIS — G4733 Obstructive sleep apnea (adult) (pediatric): Secondary | ICD-10-CM

## 2022-12-18 DIAGNOSIS — E118 Type 2 diabetes mellitus with unspecified complications: Secondary | ICD-10-CM

## 2022-12-18 DIAGNOSIS — Z95810 Presence of automatic (implantable) cardiac defibrillator: Secondary | ICD-10-CM

## 2022-12-18 LAB — PSA: PSA: 0.29 ng/mL (ref 0.10–4.00)

## 2022-12-18 NOTE — Assessment & Plan Note (Signed)
Following with endocrinology.  Continue Ozempic 2 mg weekly, insulin pump.

## 2022-12-18 NOTE — Progress Notes (Signed)
Subjective:    Patient ID: Brian Williams, male    DOB: 17-Sep-1961, 62 y.o.   MRN: [id number]  HPI  Brian Williams is a very pleasant 62 y.o. male who presents today for complete physical and follow up of chronic conditions.    Immunizations: -Tetanus: Completed in 2022 -Influenza: Completed this season -Shingles: Completed Shingrix series -Pneumonia: Completed in 2019  Diet: [place].  Exercise: No regular exercise.  Eye exam: Completes annually  Dental exam: Completes semi-annually   Colonoscopy: Completed in 2023, due 2030  PSA: Due   BP Readings from Last 3 Encounters:  12/18/22 124/68  11/14/22 136/70  08/15/22 138/76         Review of Systems  Constitutional:  Negative for unexpected weight change.  HENT:  Negative for rhinorrhea.   Respiratory:  Negative for cough and shortness of breath.   Cardiovascular:  Negative for chest pain.  Gastrointestinal:  Negative for constipation and diarrhea.  Genitourinary:  Negative for difficulty urinating.  Musculoskeletal:  Positive for arthralgias and back pain.  Skin:  Negative for rash.  Allergic/Immunologic: Negative for environmental allergies.  Neurological:  Positive for numbness. Negative for dizziness and headaches.  Psychiatric/Behavioral:  Positive for sleep disturbance.          Past Medical History:  Diagnosis Date   Acute appendicitis 04/03/2021   Acute pyelonephritis 09/07/2021   AICD (automatic cardioverter/defibrillator) present    Allergy    Anxiety    takes Xanax as needed   Appendicitis    Arthritis    back,knees,right shoulder   Back pain    Bacteremia due to Klebsiella pneumoniae 09/07/2021   Cardiomyopathy    CHF (congestive heart failure)    takes Lasix and Aldactone daily   Coronary artery disease    takes Plavix daily   Depression    takes Zoloft daily   Diabetes mellitus without complication    Humulin R and Farxiga daily.Fasting blood sugar runs 140   GERD  (gastroesophageal reflux disease)    Headache    History of bronchitis    History of colon polyps    benign   History of hiatal hernia    Hyperlipidemia    takes Fenofibrate,Crestor, and Zetia daily   Hypertension    takes Entresto and Coreg daily   MI (myocardial infarction) 2001   Multifocal pneumonia 09/07/2021   Obesity    Peripheral neuropathy    Persistent cough for 3 weeks or longer 07/19/2022   Pneumonia    history of-last time about 14 yrs ago   PONV (postoperative nausea and vomiting)    after knee surgery 25 yrs ago b/p stayed elevated for a while   Presence of permanent cardiac pacemaker    Sepsis 09/06/2021   Shortness of breath dyspnea    with exertion   Sleep apnea    uses CPAP nightly   Ventricular tachycardia    s/p RFCA PVCs 2013    Social History   Socioeconomic History   Marital status: Married    Spouse name: Brian Williams   Number of children: 2   Years of education: Not on file   Highest education level: Not on file  Occupational History   Not on file  Tobacco Use   Smoking status: Former    Packs/day: 1.00    Years: 25.00    Additional pack years: 0.00    Total pack years: 25.00    Types: Cigarettes    Quit date: 05/19/2018  Years since quitting: 4.5   Smokeless tobacco: Never  Vaping Use   Vaping Use: Never used  Substance and Sexual Activity   Alcohol use: No    Comment: rare   Drug use: No   Sexual activity: Not Currently  Other Topics Concern   Not on file  Social History Narrative   Married.   Moved from [place].   Disabled.   Social Determinants of Health   Financial Resource Strain: Low Risk  (10/30/2022)   Overall Financial Resource Strain (CARDIA)    Difficulty of Paying Living Expenses: Not hard at all  Food Insecurity: No Food Insecurity (10/30/2022)   Hunger Vital Sign    Worried About Running Out of Food in the Last Year: Never true    Ran Out of Food in the Last Year: Never true  Transportation Needs: No  Transportation Needs (10/30/2022)   PRAPARE - Hydrologist (Medical): No    Lack of Transportation (Non-Medical): No  Physical Activity: Inactive (10/30/2022)   Exercise Vital Sign    Days of Exercise per Week: 0 days    Minutes of Exercise per Session: 0 min  Stress: No Stress Concern Present (10/30/2022)   [place]    Feeling of Stress : Not at all  Social Connections: Socially Isolated (10/30/2022)   Social Connection and Isolation Panel [NHANES]    Frequency of Communication with Friends and Family: Never    Frequency of Social Gatherings with Friends and Family: Twice a week    Attends Religious Services: Never    Marine scientist or Organizations: No    Attends Archivist Meetings: Never    Marital Status: Married  Human resources officer Violence: Not At Risk (10/30/2022)   Humiliation, Afraid, Rape, and Kick questionnaire    Fear of Current or Ex-Partner: No    Emotionally Abused: No    Physically Abused: No    Sexually Abused: No    Past Surgical History:  Procedure Laterality Date   APPENDECTOMY     BACK SURGERY     CARDIAC CATHETERIZATION     CARDIAC CATHETERIZATION Left 05/10/2016   Procedure: Left Heart Cath and Coronary Angiography;  Surgeon: Minna Merritts, MD;  Location: [facility];  Service: Cardiovascular;  Laterality: Left;   CARDIAC DEFIBRILLATOR PLACEMENT  10/16/2007   ICD Model number 2207-36 serial number [id number]   CARDIAC ELECTROPHYSIOLOGY STUDY AND ABLATION     CHOLECYSTECTOMY  2001   COLONOSCOPY     COLONOSCOPY WITH PROPOFOL N/A 08/28/2018   Procedure: COLONOSCOPY WITH PROPOFOL;  Surgeon: Jonathon Bellows, MD;  Location: [facility];  Service: Gastroenterology;  Laterality: N/A;   COLONOSCOPY WITH PROPOFOL N/A 10/06/2021   Procedure: COLONOSCOPY WITH PROPOFOL;  Surgeon: Jonathon Bellows, MD;  Location: [facility] ENDOSCOPY;  Service: Gastroenterology;   Laterality: N/A;   CORONARY ANGIOPLASTY WITH STENT PLACEMENT     7 stents   CORONARY ARTERY BYPASS GRAFT N/A 05/24/2016   Procedure: CORONARY ARTERY BYPASS GRAFTING (CABG) x four , using left internal mammary artery and left leg greater saphenous vein harvested endoscopically;  Surgeon: Gaye Pollack, MD;  Location: [facility];  Service: Open Heart Surgery;  Laterality: N/A;   EP IMPLANTABLE DEVICE N/A 06/16/2015   Procedure: ICD Generator Changeout;  Surgeon: Deboraha Sprang, MD;  Location: [facility];  Service: Cardiovascular;  Laterality: N/A;   ESOPHAGOGASTRODUODENOSCOPY     INSERT / REPLACE /  REMOVE PACEMAKER     KNEE SURGERY     bilateral knee    [name] WITHOUT CARDIOVERSION N/A 05/24/2016   Procedure: TRANSESOPHAGEAL ECHOCARDIOGRAM ([name]);  Surgeon: Gaye Pollack, MD;  Location: [facility];  Service: Open Heart Surgery;  Laterality: N/A;   Ulnar Nerve surgery Left 09/19/2022   VASECTOMY      Family History  Problem Relation Age of Onset   Heart attack Mother 33   Hypertension Mother    Melanoma Mother    Heart attack Father 78   Hypertension Father    Hypertension Maternal Uncle    Heart disease Maternal Uncle    Heart disease Maternal Grandmother    Stroke Maternal Grandmother    Diabetes Neg Hx     Allergies  Allergen Reactions   Naproxen Other (See Comments), Anxiety and Rash    Causes hyperactivity causes hyperactivity   Morphine And Related Nausea And Vomiting    Morphine only    Current Outpatient Medications on File Prior to Visit  Medication Sig Dispense Refill   amitriptyline (ELAVIL) 25 MG tablet Take 1 tablet (25 mg total) by mouth at bedtime. For sleep and pain 30 tablet 0   aspirin EC 81 MG tablet Take 81 mg by mouth daily.  90 tablet    clopidogrel (PLAVIX) 75 MG tablet Take 1 tablet (75 mg total) by mouth daily. 90 tablet 0   ezetimibe (ZETIA) 10 MG tablet Take 1 tablet (10 mg total) by mouth daily. For cholesterol. 90 tablet 0   furosemide (LASIX) 40 MG  tablet Take 80 mg by mouth daily.     gemfibrozil (LOPID) 600 MG tablet TAKE 1 TABLET BY MOUTH DAILY FOR CHOLESTEROL 90 tablet 0   insulin regular human CONCENTRATED (HUMULIN R) 500 UNIT/ML injection Inject 0-25 Units into the skin daily at 12 noon.     Magnesium 250 MG TABS Take 1 tablet (250 mg total) by mouth daily. (Patient not taking: Reported on 12/18/2022)  0   metoprolol succinate (TOPROL-XL) 100 MG 24 hr tablet Take 150 mg by mouth daily.     nitroGLYCERIN (NITROSTAT) 0.4 MG SL tablet Place 1 tablet (0.4 mg total) under the tongue every 5 (five) minutes as needed for chest pain. 25 tablet 1   omega-3 acid ethyl esters (LOVAZA) 1 g capsule Take 1 g by mouth 2 (two) times daily.     rosuvastatin (CRESTOR) 40 MG tablet Take 1 tablet (40 mg total) by mouth daily. 90 tablet 2   sacubitril-valsartan (ENTRESTO) 49-51 MG Take 1 tablet by mouth 2 (two) times daily.     Semaglutide, 2 MG/DOSE, (OZEMPIC, 2 MG/DOSE,) 8 MG/3ML SOPN Inject 2 mg into the skin every Friday.     spironolactone (ALDACTONE) 25 MG tablet Take 25 mg by mouth daily.     DULoxetine (CYMBALTA) 20 MG capsule Take 2 capsules (40 mg total) by mouth daily. 60 capsule 5   No current facility-administered medications on file prior to visit.    BP 124/68   Pulse 74   Temp (!) 97.4 F (36.3 C) (Temporal)   Ht 6' (1.829 m)   Wt 263 lb (119.3 kg)   SpO2 97%   BMI 35.67 kg/m  Objective:   Physical Exam HENT:     Right Ear: Tympanic membrane and ear canal normal.     Left Ear: Tympanic membrane and ear canal normal.     Nose: Nose normal.     Right Sinus: No maxillary sinus tenderness or  frontal sinus tenderness.     Left Sinus: No maxillary sinus tenderness or frontal sinus tenderness.  Eyes:     Conjunctiva/sclera: Conjunctivae normal.  Neck:     Thyroid: No thyromegaly.     Vascular: No carotid bruit.  Cardiovascular:     Rate and Rhythm: Normal rate and regular rhythm.     Heart sounds: Normal heart sounds.   Pulmonary:     Effort: Pulmonary effort is normal.     Breath sounds: Normal breath sounds. No wheezing or rales.  Abdominal:     General: Bowel sounds are normal.     Palpations: Abdomen is soft.     Tenderness: There is no abdominal tenderness.  Musculoskeletal:        General: Normal range of motion.     Cervical back: Neck supple.  Skin:    General: Skin is warm and dry.  Neurological:     Mental Status: He is alert and oriented to person, place, and time.     Cranial Nerves: No cranial nerve deficit.     Deep Tendon Reflexes: Reflexes are normal and symmetric.  Psychiatric:        Mood and Affect: Mood normal.           Assessment & Plan:  Preventative health care Assessment & Plan: Immunizations UTD. Colonoscopy UTD, due 2030 PSA due and pending.  Discussed the importance of a healthy diet and regular exercise in order for weight loss, and to reduce the risk of further co-morbidity.  Exam stable. Labs pending.  Follow up in 1 year for repeat physical.    Congestive dilated cardiomyopathy Assessment & Plan: Following with cardiology, office notes reviewed from October 2023. Continue furosemide 80 mg daily, metoprolol succinate 150 mg daily, spironolactone 25 mg daily, Entresto BID.  Reviwed ICD report from February 2024.   Chronic systolic heart failure Assessment & Plan: Appears euvolemic.  Continue furosemide 80 mg daily, spironolactone 25 mg daily, Entreso BID, metoprolol succinate 150 mg daily. Following with cardiology, office notes reviewed from October 2023.   Essential hypertension Assessment & Plan: Controlled.  Continue metoprolol succinate 150 mg daily, spironolactone 25 mg daily.   OSA on CPAP Assessment & Plan: Irregular use of CPAP machine. Discussed to resume nightly use.   Diabetic polyneuropathy associated with type 2 diabetes mellitus Assessment & Plan: Chronic and continued.  Remain off gabapentin. Continue Cymbalta  40 mg daily.   Continue amitriptyline 25 mg HS, increase to 50 mg HS in a few days. He will update.  No longer following with pain management.    Type 2 diabetes mellitus with complications Assessment & Plan: Following with endocrinology.  Continue Ozempic 2 mg weekly, insulin pump.    Chronic bilateral low back pain without sciatica Assessment & Plan: Chronic and ongoing.  Continue Cymbalta 40 mg daily, amitriptyline 25 mg HS.  Increase amitriptyline to 50 mg HS in a few days.    Generalized anxiety disorder Assessment & Plan: Controlled.  No concerns today. Remain off Lexapro.  Continue Cymbalta 40 mg daily.   Hyperlipidemia, unspecified hyperlipidemia type Assessment & Plan: Continue Lovaza 1 g BID, rosuvastatin 40 mg daily, gemfibrozil 600 mg daily, Zetia 10 mg daily. Reviewed lipid panel from November 2023.   Screening for prostate cancer -     PSA  ICD (implantable cardioverter-defibrillator) in place Assessment & Plan: Reviewed remote pacer check from February 2024.         Pleas Koch, NP

## 2022-12-18 NOTE — Assessment & Plan Note (Signed)
Following with cardiology, office notes reviewed from October 2023. Continue furosemide 80 mg daily, metoprolol succinate 150 mg daily, spironolactone 25 mg daily, Entresto BID.  Reviwed ICD report from February 2024.

## 2022-12-18 NOTE — Assessment & Plan Note (Signed)
Controlled.  No concerns today. Remain off Lexapro.  Continue Cymbalta 40 mg daily.

## 2022-12-18 NOTE — Assessment & Plan Note (Signed)
Reviewed remote pacer check from February 2024.

## 2022-12-18 NOTE — Assessment & Plan Note (Signed)
Appears euvolemic.  Continue furosemide 80 mg daily, spironolactone 25 mg daily, Entreso BID, metoprolol succinate 150 mg daily. Following with cardiology, office notes reviewed from October 2023.

## 2022-12-18 NOTE — Assessment & Plan Note (Addendum)
Continue Lovaza 1 g BID, rosuvastatin 40 mg daily, gemfibrozil 600 mg daily, Zetia 10 mg daily. Reviewed lipid panel from November 2023.

## 2022-12-18 NOTE — Assessment & Plan Note (Signed)
Irregular use of CPAP machine. Discussed to resume nightly use.

## 2022-12-18 NOTE — Assessment & Plan Note (Signed)
Chronic and ongoing.  Continue Cymbalta 40 mg daily, amitriptyline 25 mg HS.  Increase amitriptyline to 50 mg HS in a few days.

## 2022-12-18 NOTE — Assessment & Plan Note (Signed)
Immunizations UTD. Colonoscopy UTD, due 2030 PSA due and pending.  Discussed the importance of a healthy diet and regular exercise in order for weight loss, and to reduce the risk of further co-morbidity.  Exam stable. Labs pending.  Follow up in 1 year for repeat physical.

## 2022-12-18 NOTE — Assessment & Plan Note (Signed)
Controlled.  Continue metoprolol succinate 150 mg daily, spironolactone 25 mg daily.

## 2022-12-18 NOTE — Assessment & Plan Note (Addendum)
Chronic and continued.  Remain off gabapentin. Continue Cymbalta 40 mg daily.   Continue amitriptyline 25 mg HS, increase to 50 mg HS in a few days. He will update.  No longer following with pain management.

## 2022-12-18 NOTE — Telephone Encounter (Signed)
  Spoke to patient and he gave verbal permission to this RN to fax over report to [facility]. Most recent transmission faxed.

## 2022-12-20 ENCOUNTER — Encounter: Payer: Self-pay | Admitting: Occupational Therapy

## 2022-12-20 ENCOUNTER — Ambulatory Visit: Payer: Medicare Other | Attending: Orthopedic Surgery | Admitting: Occupational Therapy

## 2022-12-20 DIAGNOSIS — M6281 Muscle weakness (generalized): Secondary | ICD-10-CM | POA: Insufficient documentation

## 2022-12-20 DIAGNOSIS — Z9889 Other specified postprocedural states: Secondary | ICD-10-CM | POA: Insufficient documentation

## 2022-12-20 DIAGNOSIS — M25642 Stiffness of left hand, not elsewhere classified: Secondary | ICD-10-CM | POA: Diagnosis not present

## 2022-12-20 DIAGNOSIS — N5089 Other specified disorders of the male genital organs: Secondary | ICD-10-CM | POA: Insufficient documentation

## 2022-12-20 DIAGNOSIS — M25622 Stiffness of left elbow, not elsewhere classified: Secondary | ICD-10-CM | POA: Diagnosis not present

## 2022-12-20 DIAGNOSIS — R208 Other disturbances of skin sensation: Secondary | ICD-10-CM | POA: Diagnosis not present

## 2022-12-20 NOTE — Therapy (Signed)
[facility] [address] [place], [address] Phone: [phone]   Fax:  [phone]  Occupational Therapy Evaluation  Patient Details  Name: Brian Williams MRN: [id number] Date of Birth: [birth year] Referring Provider (OT): DR Draeger   Encounter Date: 12/20/2022   OT End of Session - 12/20/22 2026     Visit Number 1    Number of Visits 6    Date for OT Re-Evaluation 02/14/23    OT Start Time 1538    OT Stop Time 1630    OT Time Calculation (min) 52 min    Activity Tolerance Patient tolerated treatment well    Behavior During Therapy [facility] for tasks assessed/performed             Past Medical History:  Diagnosis Date   Acute appendicitis 04/03/2021   Acute pyelonephritis 09/07/2021   AICD (automatic cardioverter/defibrillator) present    Allergy    Anxiety    takes Xanax as needed   Appendicitis    Arthritis    back,knees,right shoulder   Back pain    Bacteremia due to Klebsiella pneumoniae 09/07/2021   Cardiomyopathy    CHF (congestive heart failure)    takes Lasix and Aldactone daily   Coronary artery disease    takes Plavix daily   Depression    takes Zoloft daily   Diabetes mellitus without complication    Humulin R and Farxiga daily.Fasting blood sugar runs 140   GERD (gastroesophageal reflux disease)    Headache    History of bronchitis    History of colon polyps    benign   History of hiatal hernia    Hyperlipidemia    takes Fenofibrate,Crestor, and Zetia daily   Hypertension    takes Entresto and Coreg daily   MI (myocardial infarction) 2001   Multifocal pneumonia 09/07/2021   Obesity    Peripheral neuropathy    Persistent cough for 3 weeks or longer 07/19/2022   Pneumonia    history of-last time about 14 yrs ago   PONV (postoperative nausea and vomiting)    after knee surgery 25 yrs ago b/p stayed elevated for a while   Presence of permanent cardiac pacemaker    Sepsis  09/06/2021   Shortness of breath dyspnea    with exertion   Sleep apnea    uses CPAP nightly   Ventricular tachycardia    s/p RFCA PVCs 2013    Past Surgical History:  Procedure Laterality Date   APPENDECTOMY     BACK SURGERY     CARDIAC CATHETERIZATION     CARDIAC CATHETERIZATION Left 05/10/2016   Procedure: Left Heart Cath and Coronary Angiography;  Surgeon: Minna Merritts, MD;  Location: [place] [facility];  Service: Cardiovascular;  Laterality: Left;   CARDIAC DEFIBRILLATOR PLACEMENT  10/16/2007   ICD Model number 2207-36 serial number [id number]   CARDIAC ELECTROPHYSIOLOGY STUDY AND ABLATION     CHOLECYSTECTOMY  2001   COLONOSCOPY     COLONOSCOPY WITH PROPOFOL N/A 08/28/2018   Procedure: COLONOSCOPY WITH PROPOFOL;  Surgeon: Jonathon Bellows, MD;  Location: [facility] END[facility]COPY;  Service: Gastroenterology;  Laterality: N/A;   COLONOSCOPY WITH PROPOFOL N/A 10/06/2021   Procedure: COLONOSCOPY WITH PROPOFOL;  Surgeon: Jonathon Bellows, MD;  Location: [facility] ENDOSCOPY;  Service: Gastroenterology;  Laterality: N/A;   CORONARY ANGIOPLASTY WITH STENT PLACEMENT     7 stents   CORONARY ARTERY BYPASS GRAFT N/A 05/24/2016   Procedure: CORONARY ARTERY BYPASS GRAFTING (CABG)  x four , using left internal mammary artery and left leg greater saphenous vein harvested endoscopically;  Surgeon: Gaye Pollack, MD;  Location: [place] [facility];  Service: Open Heart Surgery;  Laterality: N/A;   EP IMPLANTABLE DEVICE N/A 06/16/2015   Procedure: ICD Generator Changeout;  Surgeon: Deboraha Sprang, MD;  Location: [place] [facility];  Service: Cardiovascular;  Laterality: N/A;   ESOPHAGOGASTRODUODENOSCOPY     INSERT / REPLACE / REMOVE PACEMAKER     KNEE SURGERY     bilateral knee    [name] WITHOUT CARDIOVERSION N/A 05/24/2016   Procedure: TRANSESOPHAGEAL ECHOCARDIOGRAM ([name]);  Surgeon: Gaye Pollack, MD;  Location: [place];  Service: Open Heart Surgery;  Laterality: N/A;   Ulnar Nerve surgery Left 09/19/2022   VASECTOMY      There  were no vitals filed for this visit.   Subjective Assessment - 12/20/22 1958     Subjective  I had surgery in Jan for my L elbow -I could only do about 3 sessions of therapy and then have trouble finding somebody in town.  My pinky and ring fingers still numb as well as some pain in my hand and apply forearm to elbow.  Still cannot straighten my elbow.    Pertinent History 10/25/22 Ortho visit -  post left cubital tunnel release. Date of surgery: 09/19/22 by Dr  Elodia Florence  PLAN: We had a good discussion with the patient regarding his continued postoperative care. He is making good progress in his healing. We reviewed the timeline and expectation for the resolution of residual numbness, as well as hand weakness, given the severity of his preoperative cubital tunnel syndrome. We recommend that he continue to follow with hand therapy to work on range of motion. His right wrist pain is likely due to tendonitis and we discussed that it would be reasonable for him to wear a brace at night to help manage his discomfort. All questions were answered and the patient is amenable to the plan. We will have the patient follow up in clinic in 6 weeks. Next appt end of April per pt - had 3 session of OT - last 10/11/22 -seen Ortho in [place] for bil knee pain and encourage pt to follow up with OT in town - OT eval order    Patient Stated Goals I want to be able to make a fist and get my grip strength back so that I can do things around the house cooking and cleaning.    Currently in Pain? Yes    Pain Location Arm    Pain Orientation Left    Pain Descriptors / Indicators Aching;Sharp;Tightness;Tingling;Numbness    Pain Type Surgical pain    Pain Onset More than a month ago               [facility] OT Assessment - 12/20/22 0001       Assessment   Medical Diagnosis L cubital tunnel release    Referring Provider (OT) DR Draeger    Onset Date/Surgical Date 09/19/22    Hand Dominance Right    Next MD Visit End of April     Prior Therapy 3 session  after surgery at [place] [facility]      Prior Function   Vocation Retired    Leisure Watch tv, clean house and cooking      AROM   Left Elbow Flexion 130    Left Elbow Extension -20  Left Wrist Extension 60 Degrees    Left Wrist Flexion 90 Degrees      Strength   Right Hand Grip (lbs) 62    Right Hand Lateral Pinch 15 lbs    Right Hand 3 Point Pinch 13 lbs    Left Hand Grip (lbs) 29    Left Hand Lateral Pinch 12 lbs    Left Hand 3 Point Pinch 10 lbs      Left Hand AROM   L Ring  MCP 0-90 80 Degrees    L Ring PIP 0-100 95 Degrees    L Little  MCP 0-90 90 Degrees    L Little PIP 0-100 90 Degrees               Moist heat done to left elbow for gentle extension stretch  add to HEP  2-3 x day  followed by interlocking of digits using right hand for active assisted range of motion elbow flexion to left and right side of neck15 reps Tendon glides 15 reps Patient to not prop up on left elbow.  During assessment noticed patient sitting with weight shifted to left hip leaning on left elbow.  Education done about positioning. Patient to report not cradling arm or sleeping with elbow in flexion.               OT Education - 12/20/22 2026     Education Details Findings of evaluation and home program reviewed    Person(s) Educated Patient    Methods Explanation;Demonstration;Tactile cues;Verbal cues;Handout    Comprehension Verbal cues required;Returned demonstration;Verbalized understanding                 OT Long Term Goals - 12/20/22 2033       OT LONG TERM GOAL #1   Title Patient to be independent in home program to increase elbow flexion extension by 5 to 10 degrees well as fifth digit flexion with pain less then 2/10 with ADLs.    Baseline Elbow extension -20, flexion 130 degrees; limited flexion of fifth and fourth digits and pain shooting sharp as well as numbness in the hand up ulnar  forearm to elbow.  Limiting functional use    Time 4    Period Weeks    Status New    Target Date 01/17/23      OT LONG TERM GOAL #2   Title Left digits flexion improved for patient to touch palm with no increase symptoms and to initiate grip strengthening.    Baseline Fourth MC flexion 80 and PIP 95, fifth digit MC flexion and PIP flexion 90 -with vision and patient pain attention to.  Grip 29 pounds in right 62 pounds    Time 6    Period Weeks    Status New    Target Date 01/31/23      OT LONG TERM GOAL #3   Title Left grip strength increase to 40 pounds  to be able to carry groceries,  cleaning as well as cutting food  with no increase symptoms    Baseline Grip 29 pounds right 62 pounds.  Patient limited in decreased motion in fifth and fourth as well as increased pain and numbness    Time 8    Status New    Target Date 02/14/23                   Plan - 12/20/22 2028     Clinical Impression Statement Patient presented at OT evaluation  with a diagnosis of left cubital tunnel release done in 09/19/2022 by Dr. Elodia Florence.  Patient is 3 months postop.  Patient reportedly had 3 visits after surgery at [facility] for therapy.  But stopped going because of being out of town.  Patient presented OT with decreased elbow flexion extension with pain at end range.  Patient with decreased wrist extension as well as composite flexion of fourth and fifth digits.  Patient continues to have numbness in the fifth digit more than fourth as well as ulnar side of hand.  Semmes Weinstein testing 3.61 on ulnar side of hand and full fifth digit.  Patient with positive Tinel at mid forearm.  Patient also with decreased grip strength in left hand 29 pounds and right hand 62 pounds.  Patient limited in functional use of left hand and upper extremity with reports of numbness as well as stiffness and shooting pain in hand up to wrist and elbow limiting functional use.  Patient can benefit from skilled OT services to  decrease pain and stiffness and increase strength to return to prior level of function    OT Occupational Profile and History Problem Focused Assessment - Including review of records relating to presenting problem    Occupational performance deficits (Please refer to evaluation for details): ADL's;IADL's;Play;Social Participation;Leisure    Body Structure / Function / Physical Skills ADL;Flexibility;ROM;UE functional use;Scar mobility;Sensation;Strength;Pain;IADL    Rehab Potential Good    Clinical Decision Making Limited treatment options, no task modification necessary    Comorbidities Affecting Occupational Performance: May have comorbidities impacting occupational performance   DM   OT Frequency 1x / week   decrease to biweekly and month dep on progress   OT Duration 8 weeks    OT Treatment/Interventions Self-care/ADL training;Therapeutic exercise;Moist Heat;Manual Therapy;Passive range of motion;Scar mobilization;Patient/family education;Paraffin    Consulted and Agree with Plan of Care Patient             Patient will benefit from skilled therapeutic intervention in order to improve the following deficits and impairments:   Body Structure / Function / Physical Skills: ADL, Flexibility, ROM, UE functional use, Scar mobility, Sensation, Strength, Pain, IADL       Visit Diagnosis: S/P cubital tunnel release  Testicular swelling, right - Plan: US Scrotum  Stiffness of left elbow, not elsewhere classified  Stiffness of left hand, not elsewhere classified  Muscle weakness (generalized)  Other disturbances of skin sensation    Problem List Patient Active Problem List   Diagnosis Date Noted   Hypogammaglobulinemia 11/24/2022   Testicular swelling, right 11/15/2022   Chronic diarrhea 10/23/2022   Ulnar neuropathy of both upper extremities 04/11/2022   Chronic fatigue and malaise 10/12/2021   Chronic anemia 10/12/2021   Osteoarthritis of knee 08/29/2021   Lumbar  degenerative disc disease 05/11/2021   Lumbar facet arthropathy 05/11/2021   Lumbar post-laminectomy syndrome 05/11/2021   Chronic pain syndrome 05/11/2021   Chronic SI joint pain 05/11/2021   Hypotension 12/06/2020   Vision changes 12/01/2020   Pain in both lower extremities 08/05/2020   Sensory ataxia 08/05/2020   Myalgia 01/28/2019   MDD (major depressive disorder), recurrent episode, moderate 07/08/2017   Erectile dysfunction 07/08/2017   Hx of CABG 07/26/2016   Coronary artery disease 05/24/2016   Atherosclerosis of native coronary artery of native heart with stable angina pectoris    History of coronary artery stent placement    Unstable angina 05/04/2016   Obesity (BMI 30-39.9) 04/06/2016   OSA on CPAP 01/06/2016  Diabetic polyneuropathy 01/06/2016   Hidradenitis suppurativa 10/06/2015   Preventative health care 10/06/2015   Chronic back pain 05/02/2015   Essential hypertension 05/02/2015   Hyperlipidemia 05/02/2015   Generalized anxiety disorder 05/02/2015   ICD (implantable cardioverter-defibrillator) in place 04/20/2015   Congestive dilated cardiomyopathy 04/20/2015   Type 2 diabetes mellitus with complications [id number]   Chronic systolic heart failure [id number]    Rosalyn Gess, OTR/L,CLT 12/20/2022, 8:37 PM  [facility] [address] [place], [address] Phone: [phone]   Fax:  [phone]  Name: Brian Williams MRN: [id number] Date of Birth: [birth year]

## 2022-12-25 ENCOUNTER — Ambulatory Visit: Payer: Medicare Other | Admitting: Occupational Therapy

## 2022-12-25 DIAGNOSIS — M6281 Muscle weakness (generalized): Secondary | ICD-10-CM

## 2022-12-25 DIAGNOSIS — R208 Other disturbances of skin sensation: Secondary | ICD-10-CM

## 2022-12-25 DIAGNOSIS — M25642 Stiffness of left hand, not elsewhere classified: Secondary | ICD-10-CM | POA: Diagnosis not present

## 2022-12-25 DIAGNOSIS — Z9889 Other specified postprocedural states: Secondary | ICD-10-CM

## 2022-12-25 DIAGNOSIS — M25622 Stiffness of left elbow, not elsewhere classified: Secondary | ICD-10-CM

## 2022-12-25 DIAGNOSIS — N5089 Other specified disorders of the male genital organs: Secondary | ICD-10-CM | POA: Diagnosis not present

## 2022-12-25 NOTE — Therapy (Signed)
[facility] [address] [place], [address] Phone: [phone]   Fax:  [phone]  Occupational Therapy Treatment  Patient Details  Name: Brian Williams MRN: [id number] Date of Birth: 01-Mar-1961 Referring Provider (OT): DR Draeger   Encounter Date: 12/25/2022   OT End of Session - 12/25/22 1213     Visit Number 2    Number of Visits 6    Date for OT Re-Evaluation 02/14/23    OT Start Time 1200    OT Stop Time 1238    OT Time Calculation (min) 38 min    Activity Tolerance Patient tolerated treatment well    Behavior During Therapy [facility] for tasks assessed/performed             Past Medical History:  Diagnosis Date   Acute appendicitis 04/03/2021   Acute pyelonephritis 09/07/2021   AICD (automatic cardioverter/defibrillator) present    Allergy    Anxiety    takes Xanax as needed   Appendicitis    Arthritis    back,knees,right shoulder   Back pain    Bacteremia due to Klebsiella pneumoniae 09/07/2021   Cardiomyopathy    CHF (congestive heart failure)    takes Lasix and Aldactone daily   Coronary artery disease    takes Plavix daily   Depression    takes Zoloft daily   Diabetes mellitus without complication    Humulin R and Farxiga daily.Fasting blood sugar runs 140   GERD (gastroesophageal reflux disease)    Headache    History of bronchitis    History of colon polyps    benign   History of hiatal hernia    Hyperlipidemia    takes Fenofibrate,Crestor, and Zetia daily   Hypertension    takes Entresto and Coreg daily   MI (myocardial infarction) 2001   Multifocal pneumonia 09/07/2021   Obesity    Peripheral neuropathy    Persistent cough for 3 weeks or longer 07/19/2022   Pneumonia    history of-last time about 14 yrs ago   PONV (postoperative nausea and vomiting)    after knee surgery 25 yrs ago b/p stayed elevated for a while   Presence of permanent cardiac pacemaker    Sepsis 09/06/2021    Shortness of breath dyspnea    with exertion   Sleep apnea    uses CPAP nightly   Ventricular tachycardia    s/p RFCA PVCs 2013    Past Surgical History:  Procedure Laterality Date   APPENDECTOMY     BACK SURGERY     CARDIAC CATHETERIZATION     CARDIAC CATHETERIZATION Left 05/10/2016   Procedure: Left Heart Cath and Coronary Angiography;  Surgeon: Antonieta Iba, MD;  Location: [facility] INVASIVE CV LAB;  Service: Cardiovascular;  Laterality: Left;   CARDIAC DEFIBRILLATOR PLACEMENT  10/16/2007   ICD Model number 2207-36 serial number [id number]   CARDIAC ELECTROPHYSIOLOGY STUDY AND ABLATION     CHOLECYSTECTOMY  2001   COLONOSCOPY     COLONOSCOPY WITH PROPOFOL N/A 08/28/2018   Procedure: COLONOSCOPY WITH PROPOFOL;  Surgeon: Wyline Mood, MD;  Location: [facility] ENDOSCOPY;  Service: Gastroenterology;  Laterality: N/A;   COLONOSCOPY WITH PROPOFOL N/A 10/06/2021   Procedure: COLONOSCOPY WITH PROPOFOL;  Surgeon: Wyline Mood, MD;  Location: [facility] ENDOSCOPY;  Service: Gastroenterology;  Laterality: N/A;   CORONARY ANGIOPLASTY WITH STENT PLACEMENT     7 stents   CORONARY ARTERY BYPASS GRAFT N/A 05/24/2016   Procedure: CORONARY ARTERY BYPASS GRAFTING (CABG)  x four , using left internal mammary artery and left leg greater saphenous vein harvested endoscopically;  Surgeon: Alleen BorneBryan K Bartle, MD;  Location: [facility] OR;  Service: Open Heart Surgery;  Laterality: N/A;   EP IMPLANTABLE DEVICE N/A 06/16/2015   Procedure: ICD Generator Changeout;  Surgeon: Duke SalviaSteven C Klein, MD;  [facility];  Service: Cardiovascular;  Laterality: N/A;   ESOPHAGOGASTRODUODENOSCOPY     INSERT / REPLACE / REMOVE PACEMAKER     KNEE SURGERY     bilateral knee    [name] WITHOUT CARDIOVERSION N/A 05/24/2016   Procedure: TRANSESOPHAGEAL ECHOCARDIOGRAM ([name]);  Surgeon: Alleen BorneBryan K Bartle, MD;  [facility];  Service: Open Heart Surgery;  Laterality: N/A;   Ulnar Nerve surgery Left 09/19/2022   VASECTOMY      There were no  vitals filed for this visit.   Subjective Assessment - 12/25/22 1210     Subjective  Not really  pain - just when using it - like the nerve pain -otherwise about the same that last time -lifting harder than picking it up    Pertinent History 10/25/22 Ortho visit -  post left cubital tunnel release. Date of surgery: 09/19/22 by Dr  Waylan Bogaraeger  PLAN: We had a good discussion with the patient regarding his continued postoperative care. He is making good progress in his healing. We reviewed the timeline and expectation for the resolution of residual numbness, as well as hand weakness, given the severity of his preoperative cubital tunnel syndrome. We recommend that he continue to follow with hand therapy to work on range of motion. His right wrist pain is likely due to tendonitis and we discussed that it would be reasonable for him to wear a brace at night to help manage his discomfort. All questions were answered and the patient is amenable to the plan. We will have the patient follow up in clinic in 6 weeks. Next appt end of April per pt - had 3 session of OT - last 10/11/22 -[place] in [place] knee pain and encourage pt to follow up with OT in town - OT eval order    Patient Stated Goals I want to be able to make a fist and get my grip strength back so that I can do things around the house cooking and cleaning.    Currently in Pain? Yes    Pain Location Arm    Pain Orientation Left    Pain Descriptors / Indicators Aching;Shooting;Pins and needles    Pain Type Surgical pain                [facility] OT Assessment - 12/25/22 0001       Strength   Left Hand Grip (lbs) 36              Patient arrived with increased fourth digit composite flexion.  Still limited in fifth because of decreased sensation. Grip strength improved from 29 to 36 pounds. Elbow flexion extension continue to be the same.        OT Treatments/Exercises (OP) - 12/25/22 0001       LUE Paraffin   Number  Minutes Paraffin 8 Minutes    LUE Paraffin Location --   elbow ext stretch   Comments heatingpad for ext stretch - hand in fist and ext            Use Graston and massage tool for soft tissue massage prior to elbow extension endrange passive range of motion. Graston  tool  #2 for sweeping over bicep as well as forearm with passive range of motion elbow extension. Interlock hands and address passive range of motion elbow flexion to behind head as well as below her left ear.  10 reps. Done also passive range of motion to fifth digit lumbrical and intrinsic a fist prior to composite placing hold 10 reps Green Thera-Band for elbow extension endrange standing in doorway palm down /pinky down 10 reps At home program 15 reps pain-free Scapular retraction as well as shoulder extension 12 reps pain-free green Thera-Band 2 times a day. Incorporate composite grip holding green Thera-Band Patient can increase in 3 to 4 days to a second set if pain-free.        OT Education - 12/25/22 1213     Education Details progress and changes to HEP    Person(s) Educated Patient    Methods Explanation;Demonstration;Tactile cues;Verbal cues;Handout    Comprehension Verbal cues required;Returned demonstration;Verbalized understanding                 OT Long Term Goals - 12/20/22 2033       OT LONG TERM GOAL #1   Title Patient to be independent in home program to increase elbow flexion extension by 5 to 10 degrees well as fifth digit flexion with pain less then 2/10 with ADLs.    Baseline Elbow extension -20, flexion 130 degrees; limited flexion of fifth and fourth digits and pain shooting sharp as well as numbness in the hand up ulnar forearm to elbow.  Limiting functional use    Time 4    Period Weeks    Status New    Target Date 01/17/23      OT LONG TERM GOAL #2   Title Left digits flexion improved for patient to touch palm with no increase symptoms and to initiate grip strengthening.     Baseline Fourth MC flexion 80 and PIP 95, fifth digit MC flexion and PIP flexion 90 -with vision and patient pain attention to.  Grip 29 pounds in right 62 pounds    Time 6    Period Weeks    Status New    Target Date 01/31/23      OT LONG TERM GOAL #3   Title Left grip strength increase to 40 pounds  to be able to carry groceries,  cleaning as well as cutting food  with no increase symptoms    Baseline Grip 29 pounds right 62 pounds.  Patient limited in decreased motion in fifth and fourth as well as increased pain and numbness    Time 8    Status New    Target Date 02/14/23                   Plan - 12/25/22 1239     Clinical Impression Statement Patient presented at OT evaluation with a diagnosis of left cubital tunnel release done in 09/19/2022 by Dr. Waylan Boga.  Patient is 3 months postop.  Patient reportedly had 3 visits after surgery at [facility] for therapy.  But stopped going because of being out of town.  Patient presented OT with decreased elbow flexion /extension with pain at end range.  Patient with decreased wrist extension as well as composite flexion of fourth and fifth digits.  Patient continues to have numbness in the fifth digit more than fourth as well as ulnar side of hand.  Semmes Weinstein testing 3.61 on ulnar side of hand and full fifth digit.  Patient with  positive Tinel at mid forearm.  This date pt follow up initiate PROM to elbow flexion , ext and GTB for strengthening extention - grip increase from 29 to 35 lbs. Tolerate tx well, Pain free. Patient limited in functional use of left hand and upper extremity with reports of numbness as well as stiffness and shooting pain in hand up to wrist and elbow limiting functional use.  Patient can benefit from skilled OT services to decrease pain and stiffness and increase strength to return to prior level of function    OT Occupational Profile and History Problem Focused Assessment - Including review of records relating to  presenting problem    Occupational performance deficits (Please refer to evaluation for details): ADL's;IADL's;Play;Social Participation;Leisure    Body Structure / Function / Physical Skills ADL;Flexibility;ROM;UE functional use;Scar mobility;Sensation;Strength;Pain;IADL    Rehab Potential Good    Clinical Decision Making Limited treatment options, no task modification necessary    Comorbidities Affecting Occupational Performance: May have comorbidities impacting occupational performance    Modification or Assistance to Complete Evaluation  No modification of tasks or assist necessary to complete eval    OT Frequency 1x / week    OT Duration 8 weeks    OT Treatment/Interventions Self-care/ADL training;Therapeutic exercise;Moist Heat;Manual Therapy;Passive range of motion;Scar mobilization;Patient/family education;Paraffin    Consulted and Agree with Plan of Care Patient             Patient will benefit from skilled therapeutic intervention in order to improve the following deficits and impairments:   Body Structure / Function / Physical Skills: ADL, Flexibility, ROM, UE functional use, Scar mobility, Sensation, Strength, Pain, IADL       Visit Diagnosis: Stiffness of left elbow, not elsewhere classified  S/P cubital tunnel release  Stiffness of left hand, not elsewhere classified  Muscle weakness (generalized)  Other disturbances of skin sensation    Problem List Patient Active Problem List   Diagnosis Date Noted   Hypogammaglobulinemia 11/24/2022   Testicular swelling, right 11/15/2022   Chronic diarrhea 10/23/2022   Ulnar neuropathy of both upper extremities 04/11/2022   Chronic fatigue and malaise 10/12/2021   Chronic anemia 10/12/2021   Osteoarthritis of knee 08/29/2021   Lumbar degenerative disc disease 05/11/2021   Lumbar facet arthropathy 05/11/2021   Lumbar post-laminectomy syndrome 05/11/2021   Chronic pain syndrome 05/11/2021   Chronic SI joint pain  05/11/2021   Hypotension 12/06/2020   Vision changes 12/01/2020   Pain in both lower extremities 08/05/2020   Sensory ataxia 08/05/2020   Myalgia 01/28/2019   MDD (major depressive disorder), recurrent episode, moderate 07/08/2017   Erectile dysfunction 07/08/2017   Hx of CABG 07/26/2016   Coronary artery disease 05/24/2016   Atherosclerosis of native coronary artery of native heart with stable angina pectoris    History of coronary artery stent placement    Unstable angina 05/04/2016   Obesity (BMI 30-39.9) 04/06/2016   OSA on CPAP 01/06/2016   Diabetic polyneuropathy 01/06/2016   Hidradenitis suppurativa 10/06/2015   Preventative health care 10/06/2015   Chronic back pain 05/02/2015   Essential hypertension 05/02/2015   Hyperlipidemia 05/02/2015   Generalized anxiety disorder 05/02/2015   ICD (implantable cardioverter-defibrillator) in place 04/20/2015   Congestive dilated cardiomyopathy 04/20/2015   Type 2 diabetes mellitus with complications 04/20/2015   Chronic systolic heart failure 09/30/2014    Oletta Cohn, OTR/L,CLT 12/25/2022, 12:41 PM  [facility] [address] [place], [address] Phone: [phone]  Fax:  [phone]  Name: Brian Williams MRN: [id number] Date of Birth: 1960/09/28

## 2022-12-26 DIAGNOSIS — G8929 Other chronic pain: Secondary | ICD-10-CM

## 2022-12-26 DIAGNOSIS — G4700 Insomnia, unspecified: Secondary | ICD-10-CM

## 2022-12-26 MED ORDER — AMITRIPTYLINE HCL 50 MG PO TABS: 50.0000 mg | ORAL_TABLET | Freq: Every day | ORAL | 3 refills | Status: DC

## 2022-12-27 DIAGNOSIS — E119 Type 2 diabetes mellitus without complications: Secondary | ICD-10-CM | POA: Diagnosis not present

## 2022-12-28 ENCOUNTER — Telehealth: Payer: Self-pay | Admitting: Primary Care

## 2022-12-28 NOTE — Telephone Encounter (Signed)
From: [name] To: Office of Doreene Nest, NP Sent: 12/28/2022 1:12 PM EDT Subject: Medication Renewal Request  Refills have been requested for the following medications:   furosemide (LASIX) 40 MG tablet  Preferred pharmacy: [facility] Delivery method: Mail

## 2022-12-28 NOTE — Telephone Encounter (Signed)
Please call patient:  Furosemide is prescribed by cardiology. He needs to request refill from cardiology

## 2022-12-29 DIAGNOSIS — E118 Type 2 diabetes mellitus with unspecified complications: Secondary | ICD-10-CM | POA: Diagnosis not present

## 2022-12-29 DIAGNOSIS — Z794 Long term (current) use of insulin: Secondary | ICD-10-CM | POA: Diagnosis not present

## 2023-01-01 ENCOUNTER — Ambulatory Visit: Payer: Medicare Other | Admitting: Occupational Therapy

## 2023-01-03 DIAGNOSIS — Z794 Long term (current) use of insulin: Secondary | ICD-10-CM | POA: Diagnosis not present

## 2023-01-03 DIAGNOSIS — Z6836 Body mass index (BMI) 36.0-36.9, adult: Secondary | ICD-10-CM | POA: Diagnosis not present

## 2023-01-03 DIAGNOSIS — E119 Type 2 diabetes mellitus without complications: Secondary | ICD-10-CM | POA: Diagnosis not present

## 2023-01-03 DIAGNOSIS — I5020 Unspecified systolic (congestive) heart failure: Secondary | ICD-10-CM | POA: Diagnosis not present

## 2023-01-04 ENCOUNTER — Encounter: Payer: Self-pay | Admitting: Cardiovascular Disease

## 2023-01-07 ENCOUNTER — Ambulatory Visit: Payer: Medicare Other | Admitting: Occupational Therapy

## 2023-01-07 DIAGNOSIS — Z9889 Other specified postprocedural states: Secondary | ICD-10-CM | POA: Diagnosis not present

## 2023-01-07 DIAGNOSIS — R208 Other disturbances of skin sensation: Secondary | ICD-10-CM | POA: Diagnosis not present

## 2023-01-07 DIAGNOSIS — M6281 Muscle weakness (generalized): Secondary | ICD-10-CM

## 2023-01-07 DIAGNOSIS — M25642 Stiffness of left hand, not elsewhere classified: Secondary | ICD-10-CM | POA: Diagnosis not present

## 2023-01-07 DIAGNOSIS — M25622 Stiffness of left elbow, not elsewhere classified: Secondary | ICD-10-CM | POA: Diagnosis not present

## 2023-01-07 DIAGNOSIS — N5089 Other specified disorders of the male genital organs: Secondary | ICD-10-CM | POA: Diagnosis not present

## 2023-01-10 DIAGNOSIS — G5622 Lesion of ulnar nerve, left upper limb: Secondary | ICD-10-CM | POA: Diagnosis not present

## 2023-01-14 NOTE — Progress Notes (Unsigned)
Cardiology Office Note  Date:  01/15/2023   ID:  Xaine, Sansom July 02, 1961, MRN [id number]  PCP:  Doreene Nest, NP   Chief Complaint  Patient presents with   6 month follow up     Patient c/o shortness of breath with over exertion. Medications reviewed by the patient verbally.     HPI:  Mr. Zylstra is a pleasant 62 year old gentleman with history of  coronary artery disease, hx of CABG long history of smoking,  diabetes type 2 poorly controlled with  hemoglobin A1c of 7.8,  cardiac catheterization 12 dating back to 2002,  ischemic cardiomyopathy,  echocardiogram March 2016 showing ejection fraction 20-25%,   sustained VT, VT ablation in [place] at [facility] sleep apnea, on CPAP, generator change in 2016, ICD in place,   previously seen by seen by [facility] cardiology heart failure/transplant team, [name].  EF 35% in 2017 cath 06/2019 EF 20 to 25% in January 2022 Echo May 2023 EF 25 to 30% who presents for routine follow-up of his coronary artery disease, CABG, September 2017  seen by myself in clinic 10/23 Continues to be followed by heart failure clinic at [facility], last seen December 11, 2022 Restarted on gemfibrozil Was scheduled for PET stress test and echo  In follow-up today reports he feels well, denies significant chest fullness chest pain angina, shortness of breath Reports the PET stress test was canceled, scheduled for echo at [facility]  Lab work reviewed A1c 7.8 Total chol 109, LDL 48 Potassium running low on Lasix 80 daily  Weight stable, denies any ankle swelling  Denies orthostasis symptoms EKG appears unchanged from May 2023  EKG personally reviewed by myself on todays visit Normal sinus rhythm rate 86 bpm ST-T wave abnormality consider inferolateral ischemia  Other past medical history reviewed Echocardiogram December 2022 in the hospital  1. Left ventricular ejection fraction, by estimation, is 25 to 30%. The  left ventricle has severely decreased  function. The left ventricle  demonstrates global hypokinesis. Unable to exclude regional wall motion  abnormality. The left ventricular internal   cavity size was moderately dilated. Left ventricular diastolic parameters  are indeterminate.   2. Right ventricular systolic function was not well visualized. Function  appears to be depressed. The right ventricular size is normal.   3. The mitral valve was not well visualized. Mild mitral valve  regurgitation.   4. The aortic valve is normal in structure. Aortic valve regurgitation is  not visualized. No aortic stenosis is present.   Other past medical history reviewed Farxiga/jardiance: yeast infection in groin  Prior hospitalizations reviewed In the ER, admitted 12/08/2020 Acute on chronic diarrhea:  dehydration: Metabolic acidosis GI pathogen positive for norovirus. Rifaximin TID for 14 day, too expensive Changed to bactrim (Lasix had been cut back to 40 daily post d/c)  gabapentin 600 TID by neurology  For neck pain 12/01/2020  Cath at 06/2019 at [facility] . There is Significant 3-vessel coronary artery disease including 60%  stenosis of distal LMCA, complete occlusion of the mLAD & mRCA and 80-90%  stenosis of OM2 & OM3.  2. Patent LIMA-mLAD, VG-rPDA and VG-OM2. VG-D known to be occluded.  3. Normal right and left ventricular filling pressures.  4. Normal cardiac output.   CABG By Dr. Laneta Simmers September 2017 Left internal mammary graft to the LAD SVG to diagonal, SVG to OM, SVG to PDA  cath on 05/10/2016 showing a 70% eccentric distal LM stenosis, 80% proximal LAD in-stent restenosis, 80% diagonal stenosis, and  tandem 90% proximal and mid RCA stenoses with mild in-stent narrowing. The LVEF is visually 35-45%   Previously seen in [place] for [place] cardiac issues. History dates back to 2002 when he had distal RCA disease. Several catheterizations over the next several years for in-stent restenosis of the distal RCA. Later  cath, stent placed to the LAD. Most recent catheterization appears to be April 2011 showing patent LAD and RCA stent.  PMH:   has a past medical history of Acute appendicitis (04/03/2021), Acute pyelonephritis (09/07/2021), AICD (automatic cardioverter/defibrillator) present, Allergy, Anxiety, Appendicitis, Arthritis, Back pain, Bacteremia due to Klebsiella pneumoniae (09/07/2021), Cardiomyopathy (HCC), CHF (congestive heart failure) (HCC), Coronary artery disease, Depression, Diabetes mellitus without complication (HCC), GERD (gastroesophageal reflux disease), Headache, History of bronchitis, History of colon polyps, History of hiatal hernia, Hyperlipidemia, Hypertension, MI (myocardial infarction) (HCC) (2001), Multifocal pneumonia (09/07/2021), Obesity, Peripheral neuropathy, Persistent cough for 3 weeks or longer (07/19/2022), Pneumonia, PONV (postoperative nausea and vomiting), Presence of permanent cardiac pacemaker, Sepsis (HCC) (09/06/2021), Shortness of breath dyspnea, Sleep apnea, and Ventricular tachycardia (HCC).  PSH:    Past Surgical History:  Procedure Laterality Date   APPENDECTOMY     BACK SURGERY     CARDIAC CATHETERIZATION     CARDIAC CATHETERIZATION Left 05/10/2016   Procedure: Left Heart Cath and Coronary Angiography;  Surgeon: Antonieta Iba, MD;  Location: [facility] INVASIVE CV LAB;  Service: Cardiovascular;  Laterality: Left;   CARDIAC DEFIBRILLATOR PLACEMENT  10/16/2007   ICD Model number 2207-36 serial number [id number]   CARDIAC ELECTROPHYSIOLOGY STUDY AND ABLATION     CHOLECYSTECTOMY  2001   COLONOSCOPY     COLONOSCOPY WITH PROPOFOL N/A 08/28/2018   Procedure: COLONOSCOPY WITH PROPOFOL;  Surgeon: Wyline Mood, MD;  Location: [facility] ENDOSCOPY;  Service: Gastroenterology;  Laterality: N/A;   COLONOSCOPY WITH PROPOFOL N/A 10/06/2021   Procedure: COLONOSCOPY WITH PROPOFOL;  Surgeon: Wyline Mood, MD;  Location: [facility] ENDOSCOPY;  Service: Gastroenterology;  Laterality: N/A;    CORONARY ANGIOPLASTY WITH STENT PLACEMENT     7 stents   CORONARY ARTERY BYPASS GRAFT N/A 05/24/2016   Procedure: CORONARY ARTERY BYPASS GRAFTING (CABG) x four , using left internal mammary artery and left leg greater saphenous vein harvested endoscopically;  Surgeon: Alleen Borne, MD;  Location: [facility] OR;  Service: Open Heart Surgery;  Laterality: N/A;   EP IMPLANTABLE DEVICE N/A 06/16/2015   Procedure: ICD Generator Changeout;  Surgeon: Duke Salvia, MD;  Location: [facility] INVASIVE CV LAB;  Service: Cardiovascular;  Laterality: N/A;   ESOPHAGOGASTRODUODENOSCOPY     INSERT / REPLACE / REMOVE PACEMAKER     KNEE SURGERY     bilateral knee    [name] WITHOUT CARDIOVERSION N/A 05/24/2016   Procedure: TRANSESOPHAGEAL ECHOCARDIOGRAM ([name]);  Surgeon: Alleen Borne, MD;  Location: [facility] OR;  Service: Open Heart Surgery;  Laterality: N/A;   Ulnar Nerve surgery Left 09/19/2022   VASECTOMY      Current Outpatient Medications  Medication Sig Dispense Refill   amitriptyline (ELAVIL) 50 MG tablet Take 1 tablet (50 mg total) by mouth at bedtime. For sleep and pain 90 tablet 3   aspirin EC 81 MG tablet Take 81 mg by mouth daily.  90 tablet    clopidogrel (PLAVIX) 75 MG tablet Take 1 tablet (75 mg total) by mouth daily. 90 tablet 0   DULoxetine (CYMBALTA) 20 MG capsule Take 2 capsules (40 mg total) by mouth daily. 60 capsule 5   ezetimibe (ZETIA) 10 MG tablet Take 1 tablet (10  mg total) by mouth daily. For cholesterol. 90 tablet 0   furosemide (LASIX) 40 MG tablet Take 80 mg by mouth daily.     gemfibrozil (LOPID) 600 MG tablet TAKE 1 TABLET BY MOUTH DAILY FOR CHOLESTEROL 90 tablet 0   insulin regular human CONCENTRATED (HUMULIN R) 500 UNIT/ML injection Inject 0-25 Units into the skin daily at 12 noon.     metoprolol succinate (TOPROL-XL) 100 MG 24 hr tablet Take 200 mg by mouth daily.     nitroGLYCERIN (NITROSTAT) 0.4 MG SL tablet Place 1 tablet (0.4 mg total) under the tongue every 5 (five) minutes as needed for  chest pain. 25 tablet 1   omega-3 acid ethyl esters (LOVAZA) 1 g capsule Take 1 g by mouth 2 (two) times daily.     rosuvastatin (CRESTOR) 40 MG tablet Take 1 tablet (40 mg total) by mouth daily. 90 tablet 2   sacubitril-valsartan (ENTRESTO) 49-51 MG Take 1 tablet by mouth 2 (two) times daily.     Semaglutide, 2 MG/DOSE, (OZEMPIC, 2 MG/DOSE,) 8 MG/3ML SOPN Inject 2 mg into the skin every Friday.     spironolactone (ALDACTONE) 25 MG tablet Take 25 mg by mouth daily.     No current facility-administered medications for this visit.     Allergies:   Naproxen and Morphine and related   Social History:  The patient  reports that he quit smoking about 4 years ago. His smoking use included cigarettes. He has a 25.00 pack-year smoking history. He has never used smokeless tobacco. He reports that he does not drink alcohol and does not use drugs.   Family History:   family history includes Heart attack (age of onset: 84) in his mother; Heart attack (age of onset: 37) in his father; Heart disease in his maternal grandmother and maternal uncle; Hypertension in his father, maternal uncle, and mother; Melanoma in his mother; Stroke in his maternal grandmother.    Review of Systems: Review of Systems  Constitutional: Negative.   HENT: Negative.    Respiratory: Negative.    Cardiovascular: Negative.   Gastrointestinal: Negative.   Musculoskeletal: Negative.        Leg pain  Neurological: Negative.   Psychiatric/Behavioral: Negative.    All other systems reviewed and are negative.  PHYSICAL EXAM: VS:  BP 118/60 (BP Location: Left Arm, Patient Position: Sitting, Cuff Size: Normal)   Pulse 86   Ht 6' (1.829 m)   Wt 264 lb (119.7 kg)   SpO2 98%   BMI 35.80 kg/m  , BMI Body mass index is 35.8 kg/m. Constitutional:  oriented to person, place, and time. No distress.  HENT:  Head: Grossly normal Eyes:  no discharge. No scleral icterus.  Neck: No JVD, no carotid bruits  Cardiovascular: Regular  rate and rhythm, no murmurs appreciated Pulmonary/Chest: Clear to auscultation bilaterally, no wheezes or rails Abdominal: Soft.  no distension.  no tenderness.  Musculoskeletal: Normal range of motion Neurological:  normal muscle tone. Coordination normal. No atrophy Skin: Skin warm and dry Psychiatric: normal affect, pleasant  Recent Labs: 08/23/2022: Pro B Natriuretic peptide (BNP) 53.0 11/14/2022: ALT 30; BUN 19; Creatinine, Ser 1.01; Hemoglobin 13.3; Magnesium 1.6; Platelets 168.0; Potassium 3.8; Sodium 137; TSH 1.92    Lipid Panel Lab Results  Component Value Date   CHOL 109 08/15/2022   HDL 29.80 (L) 08/15/2022   LDLCALC 65 07/22/2020   TRIG 364.0 (H) 08/15/2022      Wt Readings from Last 3 Encounters:  01/15/23 264 lb (  119.7 kg)  12/18/22 263 lb (119.3 kg)  11/14/22 265 lb (120.2 kg)     ASSESSMENT AND PLAN:  Congestive dilated cardiomyopathy (HCC) -  Chronically depressed ejection fraction estimated 25% Followed by heart failure clinic at [facility] Discussed importance of medication compliance Not on SGLT2 inhibitor secondary to groin infections Recommend he continue current medications, has repeat echo through [facility] Symptomatically feels well  Chronic systolic heart failure (HCC) -  Ejection fraction 25% per outside records Continue Lasix, spironolactone Appears euvolemic  Stable angina (HCC) Currently with no symptoms of angina. No further workup at this time. Continue current medication regimen.  Morbid obesity with BMI of 40.0-44.9, adult (HCC) Minimal weight loss over the past year On Ozempic  Hx of CABG Chronic stable anginal symptoms No further work-up needed at this time  OSA on CPAP Recommended compliance with his CPAP  Pure hypercholesterolemia Continue Crestor up to 40 and Zetia  Active smoker Continued smoking cessation recommended  Diabetes type 2 with complications Stressed the importance of calorie restriction, aggressive diabetes  control  ICD On prior clinic visit was reprogrammed given ventricularly paced rhythm   Total encounter time more than 40 minutes  Greater than 50% was spent in counseling and coordination of care with the patient    No orders of the defined types were placed in this encounter.    Signed, Dossie Arbour, M.D., Ph.D. 01/15/2023  [facility] [place], [place] [phone]

## 2023-01-15 ENCOUNTER — Ambulatory Visit: Payer: Medicare Other | Attending: Cardiovascular Disease | Admitting: Cardiovascular Disease

## 2023-01-15 ENCOUNTER — Encounter: Payer: Self-pay | Admitting: Cardiovascular Disease

## 2023-01-15 ENCOUNTER — Encounter: Payer: Self-pay | Admitting: Occupational Therapy

## 2023-01-15 VITALS — BP 118/60 | HR 86 | Ht 72.0 in | Wt 264.0 lb

## 2023-01-15 DIAGNOSIS — I25118 Atherosclerotic heart disease of native coronary artery with other forms of angina pectoris: Secondary | ICD-10-CM | POA: Diagnosis not present

## 2023-01-15 DIAGNOSIS — I442 Atrioventricular block, complete: Secondary | ICD-10-CM

## 2023-01-15 DIAGNOSIS — I493 Ventricular premature depolarization: Secondary | ICD-10-CM

## 2023-01-15 DIAGNOSIS — I5022 Chronic systolic (congestive) heart failure: Secondary | ICD-10-CM

## 2023-01-15 DIAGNOSIS — I255 Ischemic cardiomyopathy: Secondary | ICD-10-CM

## 2023-01-15 DIAGNOSIS — E1165 Type 2 diabetes mellitus with hyperglycemia: Secondary | ICD-10-CM

## 2023-01-15 DIAGNOSIS — Z95810 Presence of automatic (implantable) cardiac defibrillator: Secondary | ICD-10-CM

## 2023-01-15 DIAGNOSIS — I10 Essential (primary) hypertension: Secondary | ICD-10-CM

## 2023-01-15 DIAGNOSIS — Z951 Presence of aortocoronary bypass graft: Secondary | ICD-10-CM

## 2023-01-15 DIAGNOSIS — E7800 Pure hypercholesterolemia, unspecified: Secondary | ICD-10-CM

## 2023-01-15 DIAGNOSIS — G4733 Obstructive sleep apnea (adult) (pediatric): Secondary | ICD-10-CM

## 2023-01-15 MED ORDER — ROSUVASTATIN CALCIUM 40 MG PO TABS
40.0000 mg | ORAL_TABLET | Freq: Every day | ORAL | 3 refills | Status: AC
Start: 2023-01-15 — End: ?

## 2023-01-15 NOTE — Therapy (Signed)
[facility] [address] [place], [place], [address] Phone: [phone]   Fax:  [phone]  Occupational Therapy Treatment  Patient Details  Name: Brian Williams MRN: [id number] Date of Birth: [birth year] Referring Provider (OT): DR Draeger   Encounter Date: 01/07/2023   OT End of Session - 01/15/23 2100     Visit Number 3    Number of Visits 6    Date for OT Re-Evaluation 02/14/23    OT Start Time 1115    OT Stop Time 1201    OT Time Calculation (min) 46 min    Activity Tolerance Patient tolerated treatment well    Behavior During Therapy [facility] for tasks assessed/performed             Past Medical History:  Diagnosis Date   Acute appendicitis 04/03/2021   Acute pyelonephritis 09/07/2021   AICD (automatic cardioverter/defibrillator) present    Allergy    Anxiety    takes Xanax as needed   Appendicitis    Arthritis    back,knees,right shoulder   Back pain    Bacteremia due to Klebsiella pneumoniae 09/07/2021   Cardiomyopathy (HCC)    CHF (congestive heart failure) (HCC)    takes Lasix and Aldactone daily   Coronary artery disease    takes Plavix daily   Depression    takes Zoloft daily   Diabetes mellitus without complication (HCC)    Humulin R and Farxiga daily.Fasting blood sugar runs 140   GERD (gastroesophageal reflux disease)    Headache    History of bronchitis    History of colon polyps    benign   History of hiatal hernia    Hyperlipidemia    takes Fenofibrate,Crestor, and Zetia daily   Hypertension    takes Entresto and Coreg daily   MI (myocardial infarction) (HCC) 2001   Multifocal pneumonia 09/07/2021   Obesity    Peripheral neuropathy    Persistent cough for 3 weeks or longer 07/19/2022   Pneumonia    history of-last time about 14 yrs ago   PONV (postoperative nausea and vomiting)    after knee surgery 25 yrs ago b/p stayed elevated for a while   Presence of permanent cardiac  pacemaker    Sepsis (HCC) 09/06/2021   Shortness of breath dyspnea    with exertion   Sleep apnea    uses CPAP nightly   Ventricular tachycardia (HCC)    s/p RFCA PVCs 2013    Past Surgical History:  Procedure Laterality Date   APPENDECTOMY     BACK SURGERY     CARDIAC CATHETERIZATION     CARDIAC CATHETERIZATION Left 05/10/2016   Procedure: Left Heart Cath and Coronary Angiography;  Surgeon: Antonieta Iba, MD;  Location: [facility] INVASIVE CV LAB;  Service: Cardiovascular;  Laterality: Left;   CARDIAC DEFIBRILLATOR PLACEMENT  10/16/2007   ICD Model number 2207-36 serial number [id number]   CARDIAC ELECTROPHYSIOLOGY STUDY AND ABLATION     CHOLECYSTECTOMY  2001   COLONOSCOPY     COLONOSCOPY WITH PROPOFOL N/A 08/28/2018   Procedure: COLONOSCOPY WITH PROPOFOL;  Surgeon: Wyline Mood, MD;  Location: [facility] ENDOSCOPY;  Service: Gastroenterology;  Laterality: N/A;   COLONOSCOPY WITH PROPOFOL N/A 10/06/2021   Procedure: COLONOSCOPY WITH PROPOFOL;  Surgeon: Wyline Mood, MD;  Location: [facility] ENDOSCOPY;  Service: Gastroenterology;  Laterality: N/A;   CORONARY ANGIOPLASTY WITH STENT PLACEMENT     7 stents   CORONARY ARTERY BYPASS GRAFT N/A 05/24/2016  Procedure: CORONARY ARTERY BYPASS GRAFTING (CABG) x four , using left internal mammary artery and left leg greater saphenous vein harvested endoscopically;  Surgeon: Alleen Borne, MD;  Location: [facility] OR;  Service: Open Heart Surgery;  Laterality: N/A;   EP IMPLANTABLE DEVICE N/A 06/16/2015   Procedure: ICD Generator Changeout;  Surgeon: Duke Salvia, MD;  Location: [facility] INVASIVE CV LAB;  Service: Cardiovascular;  Laterality: N/A;   ESOPHAGOGASTRODUODENOSCOPY     INSERT / REPLACE / REMOVE PACEMAKER     KNEE SURGERY     bilateral knee    [name] WITHOUT CARDIOVERSION N/A 05/24/2016   Procedure: TRANSESOPHAGEAL ECHOCARDIOGRAM ([name]);  Surgeon: Alleen Borne, MD;  Location: [facility] OR;  Service: Open Heart Surgery;  Laterality: N/A;   Ulnar Nerve surgery Left  09/19/2022   VASECTOMY      There were no vitals filed for this visit.   Subjective Assessment - 01/15/23 2100     Pertinent History 10/25/22 Ortho visit -  post left cubital tunnel release. Date of surgery: 09/19/22 by Dr  Waylan Boga  PLAN: We had a good discussion with the patient regarding his continued postoperative care. He is making good progress in his healing. We reviewed the timeline and expectation for the resolution of residual numbness, as well as hand weakness, given the severity of his preoperative cubital tunnel syndrome. We recommend that he continue to follow with hand therapy to work on range of motion. His right wrist pain is likely due to tendonitis and we discussed that it would be reasonable for him to wear a brace at night to help manage his discomfort. All questions were answered and the patient is amenable to the plan. We will have the patient follow up in clinic in 6 weeks. Next appt end of April per pt - had 3 session of OT - last 10/11/22 -seen Ortho in [place] for bil knee pain and encourage pt to follow up with OT in town - OT eval order    Patient Stated Goals I want to be able to make a fist and get my grip strength back so that I can do things around the house cooking and cleaning.             Paraffin:   Pt seen this date for paraffin to elbow for 8 mins with added heating pad for extension stretch.    Manual therapy:   Following paraffin, pt seen for manual techniques with use of Graston and massage tool for soft tissue massage prior to elbow extension endrange passive range of motion. Graston tool  #2 for sweeping over bicep as well as forearm with passive range of motion elbow extension.   Therapeutic Exercises:   Interlock hands and passive range of motion elbow flexion to behind head as well as below left ear.  10 reps. Passive range of motion to fifth digit lumbrical and intrinsic a fist prior to composite placing hold 10 reps Green Thera-Band for elbow  extension endrange standing in doorway palm down /pinky down 10 reps At home program 15 reps pain-free Scapular retraction as well as shoulder extension 12 reps pain-free green Thera-Band  Review of HEP to perform, 2 times a day, working up to a second set of 12 reps if remains pain free.      OT Education - 01/15/23 2100     Education Details progress and changes to HEP    Person(s) Educated Patient    Methods Explanation;Demonstration;Tactile cues;Verbal cues;Handout    Comprehension  Verbal cues required;Returned demonstration;Verbalized understanding                 OT Long Term Goals - 12/20/22 2033       OT LONG TERM GOAL #1   Title Patient to be independent in home program to increase elbow flexion extension by 5 to 10 degrees well as fifth digit flexion with pain less then 2/10 with ADLs.    Baseline Elbow extension -20, flexion 130 degrees; limited flexion of fifth and fourth digits and pain shooting sharp as well as numbness in the hand up ulnar forearm to elbow.  Limiting functional use    Time 4    Period Weeks    Status New    Target Date 01/17/23      OT LONG TERM GOAL #2   Title Left digits flexion improved for patient to touch palm with no increase symptoms and to initiate grip strengthening.    Baseline Fourth MC flexion 80 and PIP 95, fifth digit MC flexion and PIP flexion 90 -with vision and patient pain attention to.  Grip 29 pounds in right 62 pounds    Time 6    Period Weeks    Status New    Target Date 01/31/23      OT LONG TERM GOAL #3   Title Left grip strength increase to 40 pounds  to be able to carry groceries,  cleaning as well as cutting food  with no increase symptoms    Baseline Grip 29 pounds right 62 pounds.  Patient limited in decreased motion in fifth and fourth as well as increased pain and numbness    Time 8    Status New    Target Date 02/14/23                   Plan - 01/15/23 2101     Clinical Impression Statement  Patient presented at OT evaluation with a diagnosis of left cubital tunnel release done in 09/19/2022 by Dr. Waylan Boga.  Patient is 3 months postop.  Patient reportedly had 3 visits after surgery at [facility] for therapy.  But stopped going because of being out of town.  Patient presented OT with decreased elbow flexion /extension with pain at end range.  Patient with decreased wrist extension as well as composite flexion of fourth and fifth digits.  Patient continues to have numbness in the fifth digit more than fourth as well as ulnar side of hand.  Semmes Weinstein testing 3.61 on ulnar side of hand and full fifth digit.  Patient with positive Tinel at mid forearm.  PROM to elbow flexion , ext and GTB for strengthening extension - grip increase from 29 to 35 lbs last session. Pt has continued to work on green theraband exercises at home, he did not increase sets or reps since last session.  Will work towards increasing over the next week as long as pain free.  Cues for proper form and technique with exercises.  Responds well to paraffin prior to ROM stretches.  Patient limited in functional use of left hand and upper extremity with reports of numbness as well as stiffness and shooting pain in hand up to wrist and elbow limiting functional use.  Patient can benefit from skilled OT services to decrease pain and stiffness and increase strength to return to prior level of function    OT Occupational Profile and History Problem Focused Assessment - Including review of records relating to presenting problem  Occupational performance deficits (Please refer to evaluation for details): ADL's;IADL's;Play;Social Participation;Leisure    Body Structure / Function / Physical Skills ADL;Flexibility;ROM;UE functional use;Scar mobility;Sensation;Strength;Pain;IADL    Rehab Potential Good    Clinical Decision Making Limited treatment options, no task modification necessary    Comorbidities Affecting Occupational Performance: May  have comorbidities impacting occupational performance    Modification or Assistance to Complete Evaluation  No modification of tasks or assist necessary to complete eval    OT Frequency 1x / week    OT Duration 8 weeks    OT Treatment/Interventions Self-care/ADL training;Therapeutic exercise;Moist Heat;Manual Therapy;Passive range of motion;Scar mobilization;Patient/family education;Paraffin    Consulted and Agree with Plan of Care Patient             Patient will benefit from skilled therapeutic intervention in order to improve the following deficits and impairments:   Body Structure / Function / Physical Skills: ADL, Flexibility, ROM, UE functional use, Scar mobility, Sensation, Strength, Pain, IADL       Visit Diagnosis: Stiffness of left elbow, not elsewhere classified  S/P cubital tunnel release  Stiffness of left hand, not elsewhere classified  Muscle weakness (generalized)  Other disturbances of skin sensation    Problem List Patient Active Problem List   Diagnosis Date Noted   Hypogammaglobulinemia (HCC) 11/24/2022   Testicular swelling, right 11/15/2022   Chronic diarrhea 10/23/2022   Ulnar neuropathy of both upper extremities 04/11/2022   Chronic fatigue and malaise 10/12/2021   Chronic anemia 10/12/2021   Osteoarthritis of knee 08/29/2021   Lumbar degenerative disc disease 05/11/2021   Lumbar facet arthropathy 05/11/2021   Lumbar post-laminectomy syndrome 05/11/2021   Chronic pain syndrome 05/11/2021   Chronic SI joint pain 05/11/2021   Hypotension 12/06/2020   Vision changes 12/01/2020   Pain in both lower extremities 08/05/2020   Sensory ataxia 08/05/2020   Myalgia 01/28/2019   MDD (major depressive disorder), recurrent episode, moderate (HCC) 07/08/2017   Erectile dysfunction 07/08/2017   Hx of CABG 07/26/2016   Coronary artery disease 05/24/2016   Atherosclerosis of native coronary artery of native heart with stable angina pectoris (HCC)     History of coronary artery stent placement    Unstable angina (HCC) 05/04/2016   Obesity (BMI 30-39.9) 04/06/2016   OSA on CPAP 01/06/2016   Diabetic polyneuropathy (HCC) 01/06/2016   Hidradenitis suppurativa 10/06/2015   Preventative health care 10/06/2015   Chronic back pain 05/02/2015   Essential hypertension 05/02/2015   Hyperlipidemia 05/02/2015   Generalized anxiety disorder 05/02/2015   ICD (implantable cardioverter-defibrillator) in place 04/20/2015   Congestive dilated cardiomyopathy (HCC) 04/20/2015   Type 2 diabetes mellitus with complications (HCC) 04/20/2015   Chronic systolic heart failure (HCC) 09/30/2014   Marvine Encalade T Maanya Hippert, OTR/L, CLT  Steen Bisig, OT 01/15/2023, 9:14 PM  [facility] [address] [place], [address] Phone: [phone]   Fax:  [phone]  Name: Brian Williams MRN: [id number] Date of Birth: 1961/06/10

## 2023-01-15 NOTE — Patient Instructions (Signed)
Medication Instructions:  No changes  Consider OTC potassium pill daily  If you need a refill on your cardiac medications before your next appointment, please call your pharmacy.   Lab work: No new labs needed  Testing/Procedures: No new testing needed  Follow-Up: At [facility], you and your health needs are our priority.  As part of our continuing mission to provide you with exceptional heart care, we have created designated Provider Care Teams.  These Care Teams include your primary Cardiologist (physician) and Advanced Practice Providers (APPs -  Physician Assistants and Nurse Practitioners) who all work together to provide you with the care you need, when you need it.  You will need a follow up appointment in 12 months  Providers on your designated Care Team:   Nicolasa Ducking, NP Eula Listen, PA-C Cadence Fransico Michael, [place]  COVID-19 Vaccine Information can be found at: [email or web address] For questions related to vaccine distribution or appointments, please email [email or web address] or call [phone].

## 2023-01-29 DIAGNOSIS — E118 Type 2 diabetes mellitus with unspecified complications: Secondary | ICD-10-CM | POA: Diagnosis not present

## 2023-01-29 DIAGNOSIS — G4730 Sleep apnea, unspecified: Secondary | ICD-10-CM | POA: Diagnosis not present

## 2023-01-29 DIAGNOSIS — Z794 Long term (current) use of insulin: Secondary | ICD-10-CM | POA: Diagnosis not present

## 2023-01-31 DIAGNOSIS — E1142 Type 2 diabetes mellitus with diabetic polyneuropathy: Secondary | ICD-10-CM | POA: Diagnosis not present

## 2023-01-31 DIAGNOSIS — Z951 Presence of aortocoronary bypass graft: Secondary | ICD-10-CM | POA: Diagnosis not present

## 2023-01-31 DIAGNOSIS — Z7982 Long term (current) use of aspirin: Secondary | ICD-10-CM | POA: Diagnosis not present

## 2023-01-31 DIAGNOSIS — E669 Obesity, unspecified: Secondary | ICD-10-CM | POA: Diagnosis not present

## 2023-01-31 DIAGNOSIS — Z8679 Personal history of other diseases of the circulatory system: Secondary | ICD-10-CM | POA: Diagnosis not present

## 2023-01-31 DIAGNOSIS — Z794 Long term (current) use of insulin: Secondary | ICD-10-CM | POA: Diagnosis not present

## 2023-01-31 DIAGNOSIS — M7660 Achilles tendinitis, unspecified leg: Secondary | ICD-10-CM | POA: Diagnosis not present

## 2023-01-31 DIAGNOSIS — G4733 Obstructive sleep apnea (adult) (pediatric): Secondary | ICD-10-CM | POA: Diagnosis not present

## 2023-01-31 DIAGNOSIS — I5022 Chronic systolic (congestive) heart failure: Secondary | ICD-10-CM | POA: Diagnosis not present

## 2023-01-31 DIAGNOSIS — Z9989 Dependence on other enabling machines and devices: Secondary | ICD-10-CM | POA: Diagnosis not present

## 2023-01-31 DIAGNOSIS — M109 Gout, unspecified: Secondary | ICD-10-CM | POA: Diagnosis not present

## 2023-01-31 DIAGNOSIS — Z7902 Long term (current) use of antithrombotics/antiplatelets: Secondary | ICD-10-CM | POA: Diagnosis not present

## 2023-01-31 DIAGNOSIS — I110 Hypertensive heart disease with heart failure: Secondary | ICD-10-CM | POA: Diagnosis not present

## 2023-01-31 DIAGNOSIS — M1710 Unilateral primary osteoarthritis, unspecified knee: Secondary | ICD-10-CM | POA: Diagnosis not present

## 2023-01-31 DIAGNOSIS — E785 Hyperlipidemia, unspecified: Secondary | ICD-10-CM | POA: Diagnosis not present

## 2023-01-31 DIAGNOSIS — Z87891 Personal history of nicotine dependence: Secondary | ICD-10-CM | POA: Diagnosis not present

## 2023-01-31 DIAGNOSIS — F419 Anxiety disorder, unspecified: Secondary | ICD-10-CM | POA: Diagnosis not present

## 2023-01-31 DIAGNOSIS — Z6836 Body mass index (BMI) 36.0-36.9, adult: Secondary | ICD-10-CM | POA: Diagnosis not present

## 2023-01-31 DIAGNOSIS — I2510 Atherosclerotic heart disease of native coronary artery without angina pectoris: Secondary | ICD-10-CM | POA: Diagnosis not present

## 2023-01-31 DIAGNOSIS — I255 Ischemic cardiomyopathy: Secondary | ICD-10-CM | POA: Diagnosis not present

## 2023-02-05 ENCOUNTER — Ambulatory Visit (INDEPENDENT_AMBULATORY_CARE_PROVIDER_SITE_OTHER): Payer: Medicare Other

## 2023-02-05 DIAGNOSIS — I255 Ischemic cardiomyopathy: Secondary | ICD-10-CM

## 2023-02-05 DIAGNOSIS — I442 Atrioventricular block, complete: Secondary | ICD-10-CM

## 2023-02-05 LAB — CUP PACEART REMOTE DEVICE CHECK
Battery Remaining Longevity: 26 mo
Battery Remaining Percentage: 29 %
Battery Voltage: 2.83 V
Brady Statistic AP VP Percent: 1 %
Brady Statistic AP VS Percent: 4.7 %
Brady Statistic AS VP Percent: 26 %
Brady Statistic AS VS Percent: 68 %
Brady Statistic RA Percent Paced: 4.5 %
Brady Statistic RV Percent Paced: 26 %
Date Time Interrogation Session: 20240521020022
HighPow Impedance: 48 Ohm
HighPow Impedance: 48 Ohm
Implantable Lead Connection Status: 753985
Implantable Lead Connection Status: 753985
Implantable Lead Implant Date: 20160929
Implantable Lead Implant Date: 20160929
Implantable Lead Location: 753859
Implantable Lead Location: 753860
Implantable Lead Model: 7071
Implantable Pulse Generator Implant Date: 20160929
Lead Channel Impedance Value: 350 Ohm
Lead Channel Impedance Value: 410 Ohm
Lead Channel Pacing Threshold Amplitude: 0.75 V
Lead Channel Pacing Threshold Amplitude: 0.75 V
Lead Channel Pacing Threshold Pulse Width: 0.4 ms
Lead Channel Pacing Threshold Pulse Width: 0.5 ms
Lead Channel Sensing Intrinsic Amplitude: 3.8 mV
Lead Channel Sensing Intrinsic Amplitude: 7 mV
Lead Channel Setting Pacing Amplitude: 2 V
Lead Channel Setting Pacing Amplitude: 2.5 V
Lead Channel Setting Pacing Pulse Width: 0.5 ms
Lead Channel Setting Sensing Sensitivity: 0.5 mV
Pulse Gen Serial Number: 7306375

## 2023-02-13 DIAGNOSIS — G4700 Insomnia, unspecified: Secondary | ICD-10-CM

## 2023-02-13 DIAGNOSIS — G4733 Obstructive sleep apnea (adult) (pediatric): Secondary | ICD-10-CM

## 2023-02-13 DIAGNOSIS — I420 Dilated cardiomyopathy: Secondary | ICD-10-CM

## 2023-02-13 DIAGNOSIS — E118 Type 2 diabetes mellitus with unspecified complications: Secondary | ICD-10-CM

## 2023-02-13 DIAGNOSIS — Z95810 Presence of automatic (implantable) cardiac defibrillator: Secondary | ICD-10-CM

## 2023-02-13 DIAGNOSIS — I5022 Chronic systolic (congestive) heart failure: Secondary | ICD-10-CM

## 2023-02-13 DIAGNOSIS — I10 Essential (primary) hypertension: Secondary | ICD-10-CM

## 2023-02-14 ENCOUNTER — Telehealth: Payer: Self-pay

## 2023-02-14 NOTE — Progress Notes (Signed)
   Care Guide Note  02/14/2023 Name: ZELIG DOOLAN MRN: [id number] DOB: 07-Nov-1960  Referred by: Doreene Nest, NP Reason for referral : Care Coordination (Outreach to schedule with Pharm d )   DASHONE BIXLER is a 62 y.o. year old male who is a primary care patient of Doreene Nest, NP. [name] was referred to the pharmacist for assistance related to  insomnia  .    An unsuccessful telephone outreach was attempted today to contact the patient who was referred to the pharmacy team for assistance with medication management. Additional attempts will be made to contact the patient.   Penne Lash, RMA Care Guide [facility]  [place], [place] [address] Direct Dial: [phone] [email or web address]

## 2023-02-20 DIAGNOSIS — K080 Exfoliation of teeth due to systemic causes: Secondary | ICD-10-CM | POA: Diagnosis not present

## 2023-02-20 NOTE — Progress Notes (Unsigned)
   Care Guide Note  02/20/2023 Name: Brian Williams MRN: [id number] DOB: Jan 12, 1961  Referred by: Doreene Nest, NP Reason for referral : Care Coordination (Outreach to schedule with Pharm d )   Brian Williams is a 62 y.o. year old male who is a primary care patient of Doreene Nest, NP. [name] was referred to the pharmacist for assistance related to DM.    A second unsuccessful telephone outreach was attempted today to contact the patient who was referred to the pharmacy team for assistance with medication management. Additional attempts will be made to contact the patient.  Penne Lash, RMA Care Guide [facility]  [place], [place] [address] Direct Dial: [phone] [email or web address]

## 2023-02-20 NOTE — Telephone Encounter (Signed)
Patient returned call to the office, requested a call back whenever possible, please advise [phone]

## 2023-02-25 NOTE — Progress Notes (Unsigned)
   Care Guide Note  02/25/2023 Name: LONDYN WOTTON MRN: [id number] DOB: [birth year]  Referred by: Doreene Nest, NP Reason for referral : Care Coordination (Outreach to schedule with Pharm d )   PHENG PROKOP is a 62 y.o. year old male who is a primary care patient of Doreene Nest, NP. [name] was referred to the pharmacist for assistance related to DM.    A third unsuccessful telephone outreach was attempted today to contact the patient who was referred to the pharmacy team for assistance with medication management. The Population Health team is pleased to engage with this patient at any time in the future upon receipt of referral and should he/she be interested in assistance from the [facility] team.   Penne Lash, RMA Care Guide [facility]  [place], [place] [address] Direct Dial: [phone] [email or web address]

## 2023-02-26 NOTE — Progress Notes (Signed)
   Care Guide Note  02/26/2023 Name: Brian Williams MRN: [id number] DOB: 1961/04/22  Referred by: Doreene Nest, NP Reason for referral : Care Coordination (Outreach to schedule with Pharm d )   Brian Williams is a 62 y.o. year old male who is a primary care patient of Doreene Nest, NP. Brian Williams was referred to the pharmacist for assistance related to  insomnia .    Successful contact was made with the patient to discuss pharmacy services including being ready for the pharmacist to call at least 5 minutes before the scheduled appointment time, to have medication bottles and any blood sugar or blood pressure readings ready for review. The patient agreed to meet with the pharmacist via with the pharmacist via telephone visit on (date/time).  03/18/2023  Brian Williams, RMA Care Guide [facility]  [place], [place] [address] Direct Dial: [phone] [email or web address]

## 2023-02-28 ENCOUNTER — Ambulatory Visit
Admission: RE | Admit: 2023-02-28 | Discharge: 2023-02-28 | Disposition: A | Discharge status: Home / Self Care | Payer: Medicare Other | Source: Ambulatory Visit | Attending: Family Medicine | Admitting: Family Medicine

## 2023-02-28 DIAGNOSIS — N433 Hydrocele, unspecified: Secondary | ICD-10-CM | POA: Diagnosis not present

## 2023-02-28 DIAGNOSIS — E118 Type 2 diabetes mellitus with unspecified complications: Secondary | ICD-10-CM | POA: Diagnosis not present

## 2023-02-28 NOTE — Progress Notes (Signed)
Remote ICD transmission.   

## 2023-03-05 ENCOUNTER — Telehealth: Payer: Self-pay

## 2023-03-05 DIAGNOSIS — N433 Hydrocele, unspecified: Secondary | ICD-10-CM

## 2023-03-05 DIAGNOSIS — N5089 Other specified disorders of the male genital organs: Secondary | ICD-10-CM

## 2023-03-05 NOTE — Telephone Encounter (Signed)
Lvm asking pt to call back. Need to relay ultrasound results and Dr. Timoteo Expose message. (See Imaging, Result Notes- 02/28/23.)  US/Dr. Timoteo Expose msg: Your scrotal ultrasound returned showing large right hydrocele and small left hydrocele. This is like a fluid collection of the scrotum that is benign (non-cancerous). If ongoing or you have worsening discomfort, let us know for a urology referral.

## 2023-03-05 NOTE — Telephone Encounter (Signed)
Patient returned call regarding [place] lab results. Would like a call back.

## 2023-03-05 NOTE — Telephone Encounter (Signed)
Spoke with pt relaying results and Dr. Timoteo Expose message. Pt verbalizes understanding and states he still has discomfort and agrees to urology referral.

## 2023-03-06 NOTE — Telephone Encounter (Signed)
Called and spoke with patient, advised of Isabella Stalling message. Patient states his diarrhea is now resolved. Scheduled patient app for labs on 03/11/23. Patient had no concerns at this time

## 2023-03-06 NOTE — Addendum Note (Signed)
Addended by: Eustaquio Boyden on: 03/06/2023 02:07 PM   Modules accepted: Orders

## 2023-03-06 NOTE — Telephone Encounter (Signed)
Please call patient:  Please notify patient that I spoke with Dr. Sharen Hones regarding his recent symptoms.  We also discussed his visit with Dr. Sharen Hones from February 2024 when he saw him for chronic fatigue and diarrhea.  Has the diarrhea continued?  If so, then he needs to pick up the stool kit.  If his diarrhea has resolved then no need to pick up the stool kit.  Also, it looks like he was supposed to return for additional labs giving some abnormality from his labs obtained in February.  Please set up a lab appointment for him to come get this done.

## 2023-03-06 NOTE — Telephone Encounter (Addendum)
Spoke with pcp - will refer to urology given endorsed discomfort.   Also, I saw him back 10/2022 with chronic fatigue malaise and diarrhea. Also had chronic anemia at that time but it seemed better on last check. Workup included SPEP which had low gamma globulins. I had asked him to return for rpt labs (immunoglobulin levels, UPEP, free light chains) but never done. I had also ordered C diff test for diarrhea. Fyi to PCP. If diarrhea resolved, may not need further eval but would be reasonable to check immunoglobulin levels.

## 2023-03-07 NOTE — Telephone Encounter (Signed)
Noted, will await lab results.

## 2023-03-07 NOTE — Addendum Note (Signed)
Addended by: Doreene Nest on: 03/07/2023 06:56 AM   Modules accepted: Orders

## 2023-03-08 ENCOUNTER — Other Ambulatory Visit: Payer: Self-pay | Admitting: Primary Care

## 2023-03-11 ENCOUNTER — Other Ambulatory Visit (INDEPENDENT_AMBULATORY_CARE_PROVIDER_SITE_OTHER): Payer: Medicare Other

## 2023-03-11 DIAGNOSIS — D801 Nonfamilial hypogammaglobulinemia: Secondary | ICD-10-CM

## 2023-03-12 LAB — IMMUNOGLOBULINS A/E/G/M, SERUM: IgM (Immunoglobulin M), Srm: 28 mg/dL (ref 20–172)

## 2023-03-12 LAB — KAPPA/LAMBDA LIGHT CHAINS
Kappa free light chain: 13.4 mg/L (ref 3.3–19.4)
Kappa:Lambda Ratio: 1.58 (ref 0.26–1.65)
Lambda Free Lght Chn: 8.5 mg/L (ref 5.7–26.3)

## 2023-03-14 LAB — PROTEIN ELECTROPHORESIS, URINE REFLEX
Albumin ELP, Urine: 39.6 %
Alpha-1-Globulin, U: 7.6 %
Alpha-2-Globulin, U: 12 %
Beta Globulin, U: 28.5 %
Gamma Globulin, U: 12.3 %
Protein, Ur: 13.6 mg/dL

## 2023-03-14 LAB — IMMUNOGLOBULINS A/E/G/M, SERUM
IgA/Immunoglobulin A, Serum: 101 mg/dL (ref 61–437)
IgE (Immunoglobulin E), Serum: 56 IU/mL (ref 6–495)
IgG (Immunoglobin G), Serum: 462 mg/dL — ABNORMAL LOW (ref 603–1613)

## 2023-03-15 DIAGNOSIS — M1712 Unilateral primary osteoarthritis, left knee: Secondary | ICD-10-CM | POA: Diagnosis not present

## 2023-03-15 DIAGNOSIS — M1711 Unilateral primary osteoarthritis, right knee: Secondary | ICD-10-CM | POA: Diagnosis not present

## 2023-03-15 DIAGNOSIS — E1142 Type 2 diabetes mellitus with diabetic polyneuropathy: Secondary | ICD-10-CM | POA: Diagnosis not present

## 2023-03-15 DIAGNOSIS — M170 Bilateral primary osteoarthritis of knee: Secondary | ICD-10-CM | POA: Diagnosis not present

## 2023-03-15 DIAGNOSIS — I2510 Atherosclerotic heart disease of native coronary artery without angina pectoris: Secondary | ICD-10-CM | POA: Diagnosis not present

## 2023-03-18 ENCOUNTER — Other Ambulatory Visit: Payer: Medicare Other | Admitting: Pharmacist

## 2023-03-18 NOTE — Patient Instructions (Signed)
Insomnia Insomnia is a sleep disorder that makes it difficult to fall asleep or stay asleep. Insomnia can cause fatigue, low energy, difficulty concentrating, mood swings, and poor performance at work or school. There are three different ways to classify insomnia: Difficulty falling asleep. Difficulty staying asleep. Waking up too early in the morning. Any type of insomnia can be long-term (chronic) or short-term (acute). Both are common. Short-term insomnia usually lasts for 3 months or less. Chronic insomnia occurs at least three times a week for longer than 3 months. What are the causes? Insomnia may be caused by another condition, situation, or substance, such as: Having certain mental health conditions, such as anxiety and depression. Using caffeine, alcohol, tobacco, or drugs. Having gastrointestinal conditions, such as gastroesophageal reflux disease (GERD). Having certain medical conditions. These include: Asthma. Alzheimer's disease. Stroke. Chronic pain. An overactive thyroid gland (hyperthyroidism). Other sleep disorders, such as restless legs syndrome and sleep apnea. Menopause. Sometimes, the cause of insomnia may not be known. What increases the risk? Risk factors for insomnia include: Gender. Females are affected more often than males. Age. Insomnia is more common as people get older. Stress and certain medical and mental health conditions. Lack of exercise. Having an irregular work schedule. This may include working night shifts and traveling between different time zones. What are the signs or symptoms? If you have insomnia, the main symptom is having trouble falling asleep or having trouble staying asleep. This may lead to other symptoms, such as: Feeling tired or having low energy. Feeling nervous about going to sleep. Not feeling rested in the morning. Having trouble concentrating. Feeling irritable, anxious, or depressed. How is this diagnosed? This condition  may be diagnosed based on: Your symptoms and medical history. Your health care provider may ask about: Your sleep habits. Any medical conditions you have. Your mental health. A physical exam. How is this treated? Treatment for insomnia depends on the cause. Treatment may focus on treating an underlying condition that is causing the insomnia. Treatment may also include: Medicines to help you sleep. Counseling or therapy. Lifestyle adjustments to help you sleep better. Follow these instructions at home: Eating and drinking  Limit or avoid alcohol, caffeinated beverages, and products that contain nicotine and tobacco, especially close to bedtime. These can disrupt your sleep. Do not eat a large meal or eat spicy foods right before bedtime. This can lead to digestive discomfort that can make it hard for you to sleep. Sleep habits  Keep a sleep diary to help you and your health care provider figure out what could be causing your insomnia. Write down: When you sleep. When you wake up during the night. How well you sleep and how rested you feel the next day. Any side effects of medicines you are taking. What you eat and drink. Make your bedroom a dark, comfortable place where it is easy to fall asleep. Put up shades or blackout curtains to block light from outside. Use a white noise machine to block noise. Keep the temperature cool. Limit screen use before bedtime. This includes: Not watching TV. Not using your smartphone, tablet, or computer. Stick to a routine that includes going to bed and waking up at the same times every day and night. This can help you fall asleep faster. Consider making a quiet activity, such as reading, part of your nighttime routine. Try to avoid taking naps during the day so that you sleep better at night. Get out of bed if you are still awake after   15 minutes of trying to sleep. Keep the lights down, but try reading or doing a quiet activity. When you feel  sleepy, go back to bed. General instructions Take over-the-counter and prescription medicines only as told by your health care provider. Exercise regularly as told by your health care provider. However, avoid exercising in the hours right before bedtime. Use relaxation techniques to manage stress. Ask your health care provider to suggest some techniques that may work well for you. These may include: Breathing exercises. Routines to release muscle tension. Visualizing peaceful scenes. Make sure that you drive carefully. Do not drive if you feel very sleepy. Keep all follow-up visits. This is important. Contact a health care provider if: You are tired throughout the day. You have trouble in your daily routine due to sleepiness. You continue to have sleep problems, or your sleep problems get worse. Get help right away if: You have thoughts about hurting yourself or someone else. Get help right away if you feel like you may hurt yourself or others, or have thoughts about taking your own life. Go to your nearest emergency room or: Call 911. Call the National Suicide Prevention Lifeline at 1-800-273-8255 or 988. This is open 24 hours a day. Text the Crisis Text Line at 741741. Summary Insomnia is a sleep disorder that makes it difficult to fall asleep or stay asleep. Insomnia can be long-term (chronic) or short-term (acute). Treatment for insomnia depends on the cause. Treatment may focus on treating an underlying condition that is causing the insomnia. Keep a sleep diary to help you and your health care provider figure out what could be causing your insomnia. This information is not intended to replace advice given to you by your health care provider. Make sure you discuss any questions you have with your health care provider. Document Revised: 08/14/2021 Document Reviewed: 08/14/2021 Elsevier Patient Education  2024 Elsevier Inc.  

## 2023-03-18 NOTE — Progress Notes (Signed)
03/18/2023 Name: Brian Williams MRN: [id number] DOB: 11/19/1960  Chief Complaint  Patient presents with   Medication Management    Brian Williams is a 62 y.o. year old male who presented for a telephone visit.   They were referred to the pharmacist by their PCP for assistance in managing complex medication management.    Subjective:  Care Team: Primary Care Provider: Doreene Nest, NP ; Next Scheduled Visit:   Medication Access/Adherence  Current Pharmacy:  [facility] [address] [facility] - [place], [place] - [address] AT [facility] [address] [place] [address] Phone: [phone] Fax: [phone]  [facility]) [facility] - [place], [place] - [address] [place] [address] Phone: [phone] Fax: [phone]   Patient reports affordability concerns with their medications: No  Patient reports access/transportation concerns to their pharmacy: No  Patient reports adherence concerns with their medications:  No     Diabetes:  Current medications: Ozempic 2 mg weekly, Humulin R U500 per endo  Medications tried in the past: metformin (GI intolerance), Jardiance (yeast infections)  Heart Failure:  Current medications:  ACEi/ARB/ARNI: Entresto 49/51 mg twice daily SGLT2i: none, prior Jardiance intolerance Beta blocker: metoprolol succinate 200 mg daily Mineralocorticoid Receptor Antagonist: spironolactone 25 mg daily Diuretic regimen: furosemide 80 mg daily  Hyperlipidemia/ASCVD Risk Reduction  Current lipid lowering medications: rosuvastatin 40 mg daily, ezetimibe 10 mg daily, gemfibrozil 600 mg twice daily, OTC omega-3-fatty acids 1 g twice daily  Denies muscle aches   Depression/Anxiety and Insomnia:  Current medications: self-discontinued duloxetine due to no benefit; self discontinued amitriptyline 50 mg every evening as it was beneficial initially but no longer  is  Previously tried: melatonin, trazodone (worsened neuropathy),   Reports insomnia became an issue ~ 6 weeks ago. Currently sleeping about 1-1.5 hours every night. Gets in bed around 10:30 but does not fall asleep, dozes off eventually ~ 1-2 am, wakes back up around 4 am  Reports he had ulnar surgery in January, made it difficult to wear CPAP. Got out of the habit of CPAP. Has tried to wear again, but notes he wakes up and he has pulled it off  Daily Patterns: - Caffeine: does have regular soda in the afternoon - Naps: denies purposeful naps during the day but will doze off in the chair  - Regular Bedtime/Rise Time: somewhat, but impacted by not being able to fall asleep - Screen time: notes they do have the TV on throughout the night, his wife falls asleep to it. Will get up and play on his phone when he cannot sleep - Physical activity: limited by knee and upcoming knee replacement   [employer]; denies significant napping during the day but does doze off; does try to get in bed around 10:30; tosses and turns;   Trazodone    Objective:  Lab Results  Component Value Date   HGBA1C 7.8 (H) 08/23/2022    Lab Results  Component Value Date   CREATININE 1.01 11/14/2022   BUN 19 11/14/2022   NA 137 11/14/2022   K 3.8 11/14/2022   CL 105 11/14/2022   CO2 21 11/14/2022    Lab Results  Component Value Date   CHOL 109 08/15/2022   HDL 29.80 (L) 08/15/2022   LDLCALC 65 07/22/2020   LDLDIRECT 48.0 08/15/2022   TRIG 364.0 (H) 08/15/2022   CHOLHDL 4 08/15/2022    Medications Reviewed Today  Reviewed by Kerrie Buffalo, OT (Occupational Therapist) on 01/15/23 at 2100  Med List Status: <None>   Medication Order Taking? Sig Documenting Provider Last Dose Status Informant  amitriptyline (ELAVIL) 50 MG tablet [id number] No Take 1 tablet (50 mg total) by mouth at bedtime. For sleep and pain Brian Nest, NP Taking Active   aspirin EC 81 MG tablet [id number] No Take 81 mg by  mouth daily.  Antonieta Iba, MD Taking Active Self, Pharmacy Records  clopidogrel (PLAVIX) 75 MG tablet [id number] No Take 1 tablet (75 mg total) by mouth daily. Brian Nest, NP Taking Active Self, Pharmacy Records  DULoxetine (CYMBALTA) 20 MG capsule [id number] No Take 2 capsules (40 mg total) by mouth daily. Edward Jolly, MD Taking Active   ezetimibe (ZETIA) 10 MG tablet [id number] No Take 1 tablet (10 mg total) by mouth daily. For cholesterol. Brian Nest, NP Taking Active Self, Pharmacy Records  furosemide (LASIX) 40 MG tablet [id number] No Take 80 mg by mouth daily. [provider] Taking Active   gemfibrozil (LOPID) 600 MG tablet [id number] No TAKE 1 TABLET BY MOUTH DAILY FOR CHOLESTEROL Brian Nest, NP Taking Active   insulin regular human CONCENTRATED (HUMULIN R) 500 UNIT/ML injection [id number] No Inject 0-25 Units into the skin daily at 12 noon. [provider] Taking Active Self, Pharmacy Records           Med Note Brian Williams Jan 18, 2022  2:11 PM)    metoprolol succinate (TOPROL-XL) 100 MG 24 hr tablet [id number] No Take 200 mg by mouth daily. [provider] Taking Active Self, Pharmacy Records  nitroGLYCERIN (NITROSTAT) 0.4 MG SL tablet [id number] No Place 1 tablet (0.4 mg total) under the tongue every 5 (five) minutes as needed for chest pain. Brian Nest, NP Taking Active Self, Pharmacy Records  omega-3 acid ethyl esters (LOVAZA) 1 g capsule [id number] No Take 1 g by mouth 2 (two) times daily. [provider] Taking Active Self, Pharmacy Records  rosuvastatin (CRESTOR) 40 MG tablet [id number]  Take 1 tablet (40 mg total) by mouth daily. Antonieta Iba, MD  Active   sacubitril-valsartan (ENTRESTO) 49-51 MG [id number] No Take 1 tablet by mouth 2 (two) times daily. [provider] Taking Active Self, Pharmacy Records  Semaglutide, 2 MG/DOSE, (OZEMPIC, 2 MG/DOSE,) 8 MG/3ML SOPN [id number] No Inject 2 mg into  the skin every Friday. [provider] Taking Active Self, Pharmacy Records  spironolactone (ALDACTONE) 25 MG tablet [id number] No Take 25 mg by mouth daily. [provider] Taking Active Self, Pharmacy Records              Assessment/Plan:   Diabetes: - Currently uncontrolled - Recommend to continue current regimen and follow up with endocrinology.   Hyperlipidemia/ASCVD Risk Reduction: - Currently uncontrolled. Could consider changing to Vascepa + [employer]. Drug drug interaction between rosuvastatin and gemfibrozil, but patient reports tolerability at this time - Recommend to continue current regimen and follow up with cardiology  Heart Failure: - Currently appropriately managed - Recommend to continue current regimen at his time  Depression/Anxiety and Insomnia - Currently uncontrolled. Patient notes that his neuropathy does not seem to be a problem at this time and is not negatively impacting sleep.  - Reviewed good sleep hygiene principals, including avoiding caffeine/alcohol after noon, setting regular sleep/wake schedule, avoiding screentime before bed, physical activity as able during the day.  - Discussed impact of untreated/subtreated sleep apnea on sleep  quality. Recommended follow up with sleep medicine to discuss if different mask would be helpful; he declines at this time. - Patient notes he thinks he needs something to help him fall asleep so he can get back into a better sleep routine. Could consider restarting amitriptyline, could titrate to 100 mg QPM, to help patient sleep and get back into a better routine. He notes he has considered over the counter options; we discussed long term side effects from benadryl use. However, he may not necessarily need neuropathic impact from amitriptyline. Could consider Belsomra, pending insurance coverage, for mixed sleep onset/latency insomnia.    Follow Up Plan: follow up with PCP as  scheduled  Catie Eppie Gibson, PharmD, BCACP, CPP Clinical Pharmacist [facility] [phone]

## 2023-03-25 DIAGNOSIS — G4700 Insomnia, unspecified: Secondary | ICD-10-CM

## 2023-03-25 DIAGNOSIS — G4733 Obstructive sleep apnea (adult) (pediatric): Secondary | ICD-10-CM

## 2023-03-27 DIAGNOSIS — G4733 Obstructive sleep apnea (adult) (pediatric): Secondary | ICD-10-CM | POA: Diagnosis not present

## 2023-03-30 DIAGNOSIS — E118 Type 2 diabetes mellitus with unspecified complications: Secondary | ICD-10-CM | POA: Diagnosis not present

## 2023-04-04 DIAGNOSIS — K080 Exfoliation of teeth due to systemic causes: Secondary | ICD-10-CM | POA: Diagnosis not present

## 2023-04-05 ENCOUNTER — Ambulatory Visit: Payer: Medicare Other | Admitting: Urology

## 2023-04-05 ENCOUNTER — Encounter: Payer: Self-pay | Admitting: Urology

## 2023-04-05 VITALS — BP 97/62 | HR 105 | Ht 70.0 in | Wt 263.0 lb

## 2023-04-05 DIAGNOSIS — N433 Hydrocele, unspecified: Secondary | ICD-10-CM

## 2023-04-05 DIAGNOSIS — N50819 Testicular pain, unspecified: Secondary | ICD-10-CM

## 2023-04-05 LAB — URINALYSIS, COMPLETE
Bilirubin, UA: NEGATIVE
Glucose, UA: NEGATIVE
Ketones, UA: NEGATIVE
Leukocytes,UA: NEGATIVE
Nitrite, UA: NEGATIVE
RBC, UA: NEGATIVE
Specific Gravity, UA: 1.01 (ref 1.005–1.030)
Urobilinogen, Ur: 0.2 mg/dL (ref 0.2–1.0)
pH, UA: 6 (ref 5.0–7.5)

## 2023-04-05 LAB — MICROSCOPIC EXAMINATION
Bacteria, UA: NONE SEEN
Epithelial Cells (non renal): NONE SEEN /hpf (ref 0–10)
RBC, Urine: NONE SEEN /hpf (ref 0–2)

## 2023-04-05 NOTE — Progress Notes (Unsigned)
I, Brian Williams,acting as a scribe for Riki Altes, MD.,have documented all relevant documentation on the behalf of Riki Altes, MD,as directed by  Riki Altes, MD while in the presence of Riki Altes, MD.  04/05/2023 1:23 PM   Brian Williams 08-21-1961 [id number]  Referring provider: Eustaquio Boyden, MD [address] [place],  [place] [address]  Chief Complaint  Patient presents with   Testicle Pain    HPI: Brian Williams is a 62 y.o. male here for evaluation of a right hydrocele.  Right hemiscrotal swelling present 8-9 months. Occasional discomfort with sitting or changes in position, but no real pain On antiplatelet therapy with Plavix. Scrotal sonogram performed 02/28/23 showed normal appearing testes bilaterally and a right hydrocele.    PMH: Past Medical History:  Diagnosis Date   Acute appendicitis 04/03/2021   Acute pyelonephritis 09/07/2021   AICD (automatic cardioverter/defibrillator) present    Allergy    Anxiety    takes Xanax as needed   Appendicitis    Arthritis    back,knees,right shoulder   Back pain    Bacteremia due to Klebsiella pneumoniae 09/07/2021   Cardiomyopathy (HCC)    CHF (congestive heart failure) (HCC)    takes Lasix and Aldactone daily   Coronary artery disease    takes Plavix daily   Depression    takes Zoloft daily   Diabetes mellitus without complication (HCC)    Humulin R and Farxiga daily.Fasting blood sugar runs 140   GERD (gastroesophageal reflux disease)    Headache    History of bronchitis    History of colon polyps    benign   History of hiatal hernia    Hyperlipidemia    takes Fenofibrate,Crestor, and Zetia daily   Hypertension    takes Entresto and Coreg daily   MI (myocardial infarction) (HCC) 2001   Multifocal pneumonia 09/07/2021   Obesity    Peripheral neuropathy    Persistent cough for 3 weeks or longer 07/19/2022   Pneumonia    history of-last time about 14 yrs ago   PONV  (postoperative nausea and vomiting)    after knee surgery 25 yrs ago b/p stayed elevated for a while   Presence of permanent cardiac pacemaker    Sepsis (HCC) 09/06/2021   Shortness of breath dyspnea    with exertion   Sleep apnea    uses CPAP nightly   Ventricular tachycardia (HCC)    s/p RFCA PVCs 2013    Surgical History: Past Surgical History:  Procedure Laterality Date   APPENDECTOMY     BACK SURGERY     CARDIAC CATHETERIZATION     CARDIAC CATHETERIZATION Left 05/10/2016   Procedure: Left Heart Cath and Coronary Angiography;  Surgeon: Antonieta Iba, MD;  Location: [facility] INVASIVE CV LAB;  Service: Cardiovascular;  Laterality: Left;   CARDIAC DEFIBRILLATOR PLACEMENT  10/16/2007   ICD Model number 2207-36 serial number [id number]   CARDIAC ELECTROPHYSIOLOGY STUDY AND ABLATION     CHOLECYSTECTOMY  2001   COLONOSCOPY     COLONOSCOPY WITH PROPOFOL N/A 08/28/2018   Procedure: COLONOSCOPY WITH PROPOFOL;  Surgeon: Wyline Mood, MD;  Location: [facility] ENDOSCOPY;  Service: Gastroenterology;  Laterality: N/A;   COLONOSCOPY WITH PROPOFOL N/A 10/06/2021   Procedure: COLONOSCOPY WITH PROPOFOL;  Surgeon: Wyline Mood, MD;  Location: [facility] ENDOSCOPY;  Service: Gastroenterology;  Laterality: N/A;   CORONARY ANGIOPLASTY WITH STENT PLACEMENT     7 stents   CORONARY ARTERY BYPASS  GRAFT N/A 05/24/2016   Procedure: CORONARY ARTERY BYPASS GRAFTING (CABG) x four , using left internal mammary artery and left leg greater saphenous vein harvested endoscopically;  Surgeon: Alleen Borne, MD;  Location: [facility] OR;  Service: Open Heart Surgery;  Laterality: N/A;   EP IMPLANTABLE DEVICE N/A 06/16/2015   Procedure: ICD Generator Changeout;  Surgeon: Duke Salvia, MD;  Location: [facility] INVASIVE CV LAB;  Service: Cardiovascular;  Laterality: N/A;   ESOPHAGOGASTRODUODENOSCOPY     INSERT / REPLACE / REMOVE PACEMAKER     KNEE SURGERY     bilateral knee    [name] WITHOUT CARDIOVERSION N/A 05/24/2016   Procedure:  TRANSESOPHAGEAL ECHOCARDIOGRAM ([name]);  Surgeon: Alleen Borne, MD;  Location: [facility] OR;  Service: Open Heart Surgery;  Laterality: N/A;   Ulnar Nerve surgery Left 09/19/2022   VASECTOMY      Home Medications:  Allergies as of 04/05/2023       Reactions   Naproxen Other (See Comments), Anxiety, Rash   Causes hyperactivity causes hyperactivity   Morphine And Codeine Nausea And Vomiting   Morphine only        Medication List        Accurate as of April 05, 2023  1:23 PM. If you have any questions, ask your nurse or doctor.          STOP taking these medications    amitriptyline 50 MG tablet Commonly known as: ELAVIL Stopped by: Riki Altes   DULoxetine 20 MG capsule Commonly known as: Cymbalta Stopped by: Riki Altes       TAKE these medications    aspirin EC 81 MG tablet Take 81 mg by mouth daily.   clopidogrel 75 MG tablet Commonly known as: PLAVIX Take 1 tablet (75 mg total) by mouth daily.   Entresto 49-51 MG Generic drug: sacubitril-valsartan Take 1 tablet by mouth 2 (two) times daily.   ezetimibe 10 MG tablet Commonly known as: ZETIA Take 1 tablet (10 mg total) by mouth daily. For cholesterol.   furosemide 40 MG tablet Commonly known as: LASIX Take 80 mg by mouth daily.   gemfibrozil 600 MG tablet Commonly known as: LOPID TAKE 1 TABLET BY MOUTH DAILY FOR CHOLESTEROL   HUMULIN R 500 UNIT/ML injection Generic drug: insulin regular human CONCENTRATED Inject 0-25 Units into the skin daily at 12 noon.   metoprolol succinate 100 MG 24 hr tablet Commonly known as: TOPROL-XL Take 200 mg by mouth daily.   nitroGLYCERIN 0.4 MG SL tablet Commonly known as: NITROSTAT Place 1 tablet (0.4 mg total) under the tongue every 5 (five) minutes as needed for chest pain.   omega-3 acid ethyl esters 1 g capsule Commonly known as: LOVAZA Take 1 g by mouth 2 (two) times daily.   Ozempic (2 MG/DOSE) 8 MG/3ML Sopn Generic drug: Semaglutide (2  MG/DOSE) Inject 2 mg into the skin every Friday.   rosuvastatin 40 MG tablet Commonly known as: CRESTOR Take 1 tablet (40 mg total) by mouth daily.   spironolactone 25 MG tablet Commonly known as: ALDACTONE Take 25 mg by mouth daily.        Allergies:  Allergies  Allergen Reactions   Naproxen Other (See Comments), Anxiety and Rash    Causes hyperactivity causes hyperactivity   Morphine And Codeine Nausea And Vomiting    Morphine only    Family History: Family History  Problem Relation Age of Onset   Heart attack Mother 12   Hypertension Mother  Melanoma Mother    Heart attack Father 28   Hypertension Father    Hypertension Maternal Uncle    Heart disease Maternal Uncle    Heart disease Maternal Grandmother    Stroke Maternal Grandmother    Diabetes Neg Hx     Social History:  reports that he quit smoking about 4 years ago. His smoking use included cigarettes. He started smoking about 29 years ago. He has a 25 pack-year smoking history. He has never used smokeless tobacco. He reports that he does not drink alcohol and does not use drugs.   Physical Exam: BP 97/62   Pulse (!) 105   Ht 5\' 10"  (1.778 m)   Wt 263 lb (119.3 kg)   BMI 37.74 kg/m   Constitutional:  Alert and oriented, No acute distress. HEENT: [place] AT, moist mucus membranes.  Trachea midline, no masses. Cardiovascular: No clubbing, cyanosis, or edema. Respiratory: Normal respiratory effort, no increased work of breathing. GI: Abdomen is soft, nontender, nondistended, no abdominal masses GU: Moderate to large right hydrocele. Right testis not palpable. Left testis palpably normal.  Skin: No rashes, bruises or suspicious lesions. Neurologic: Grossly intact, no focal deficits, moving all 4 extremities. Psychiatric: Normal mood and affect.   Pertinent Imaging: Scrotal ultrasound was personally reviewed and interpreted.   Ultrasound  EXAM: SCROTAL ULTRASOUND   DOPPLER ULTRASOUND OF THE  TESTICLES   TECHNIQUE: Complete ultrasound examination of the testicles, epididymis, and other scrotal structures was performed. Color and spectral Doppler ultrasound were also utilized to evaluate blood flow to the testicles.   COMPARISON:  CT abdomen pelvis dated 09/06/2021.   FINDINGS: Right testicle   Measurements: 4.7 x 3.0 x 2.8 cm. No mass or microlithiasis visualized.   Left testicle   Measurements: 4.1 x 2.3 x 3.2 cm. No mass or microlithiasis visualized.   Right epididymis:  Normal in size and appearance.   Left epididymis:  Normal in size and appearance.   Hydrocele: There is a large right hydrocele and a small left hydrocele.   Varicocele:  None visualized.   Pulsed Doppler interrogation of both testes demonstrates normal low resistance arterial and venous waveforms bilaterally.   IMPRESSION: Large right hydrocele and small left hydrocele.     Electronically Signed   By: Romona Curls M.D.   On: 03/04/2023 15:35   Assessment & Plan:   1. Right hydrocele  Minimal symptoms at present Management options were discussed including observation, aspiration, and hydrocelectomy. We discussed that aspiration typically is a temporizing measure and in most cases the fluid will reaccumulate. Hydrocele was discussed. At this point, he has elected observation and will return as needed.  [facility] [address], [address] [place], [place] [address] [phone]

## 2023-04-08 ENCOUNTER — Encounter: Payer: Self-pay | Admitting: Urology

## 2023-04-10 DIAGNOSIS — E113293 Type 2 diabetes mellitus with mild nonproliferative diabetic retinopathy without macular edema, bilateral: Secondary | ICD-10-CM | POA: Diagnosis not present

## 2023-04-12 DIAGNOSIS — Z01818 Encounter for other preprocedural examination: Secondary | ICD-10-CM | POA: Diagnosis not present

## 2023-04-12 DIAGNOSIS — Z794 Long term (current) use of insulin: Secondary | ICD-10-CM | POA: Diagnosis not present

## 2023-04-12 DIAGNOSIS — G4733 Obstructive sleep apnea (adult) (pediatric): Secondary | ICD-10-CM | POA: Diagnosis not present

## 2023-04-12 DIAGNOSIS — I5022 Chronic systolic (congestive) heart failure: Secondary | ICD-10-CM | POA: Diagnosis not present

## 2023-04-12 DIAGNOSIS — Z6841 Body Mass Index (BMI) 40.0 and over, adult: Secondary | ICD-10-CM | POA: Diagnosis not present

## 2023-04-12 DIAGNOSIS — I2510 Atherosclerotic heart disease of native coronary artery without angina pectoris: Secondary | ICD-10-CM | POA: Diagnosis not present

## 2023-04-12 DIAGNOSIS — E1142 Type 2 diabetes mellitus with diabetic polyneuropathy: Secondary | ICD-10-CM | POA: Diagnosis not present

## 2023-04-12 DIAGNOSIS — E1159 Type 2 diabetes mellitus with other circulatory complications: Secondary | ICD-10-CM | POA: Diagnosis not present

## 2023-04-12 DIAGNOSIS — Z95810 Presence of automatic (implantable) cardiac defibrillator: Secondary | ICD-10-CM | POA: Diagnosis not present

## 2023-04-12 DIAGNOSIS — I255 Ischemic cardiomyopathy: Secondary | ICD-10-CM | POA: Diagnosis not present

## 2023-04-12 DIAGNOSIS — E1165 Type 2 diabetes mellitus with hyperglycemia: Secondary | ICD-10-CM | POA: Diagnosis not present

## 2023-04-12 DIAGNOSIS — M170 Bilateral primary osteoarthritis of knee: Secondary | ICD-10-CM | POA: Diagnosis not present

## 2023-04-12 DIAGNOSIS — Z6836 Body mass index (BMI) 36.0-36.9, adult: Secondary | ICD-10-CM | POA: Diagnosis not present

## 2023-04-12 DIAGNOSIS — I10 Essential (primary) hypertension: Secondary | ICD-10-CM | POA: Diagnosis not present

## 2023-04-12 LAB — HEMOGLOBIN A1C: Hemoglobin A1C: 6.8

## 2023-04-17 DIAGNOSIS — G4733 Obstructive sleep apnea (adult) (pediatric): Secondary | ICD-10-CM | POA: Diagnosis not present

## 2023-04-23 DIAGNOSIS — E118 Type 2 diabetes mellitus with unspecified complications: Secondary | ICD-10-CM | POA: Diagnosis not present

## 2023-04-26 DIAGNOSIS — M1712 Unilateral primary osteoarthritis, left knee: Secondary | ICD-10-CM | POA: Diagnosis not present

## 2023-04-27 DIAGNOSIS — G4733 Obstructive sleep apnea (adult) (pediatric): Secondary | ICD-10-CM | POA: Diagnosis not present

## 2023-05-03 DIAGNOSIS — M79604 Pain in right leg: Secondary | ICD-10-CM | POA: Diagnosis not present

## 2023-05-03 DIAGNOSIS — M25761 Osteophyte, right knee: Secondary | ICD-10-CM | POA: Diagnosis not present

## 2023-05-03 DIAGNOSIS — I2510 Atherosclerotic heart disease of native coronary artery without angina pectoris: Secondary | ICD-10-CM | POA: Diagnosis not present

## 2023-05-03 DIAGNOSIS — E118 Type 2 diabetes mellitus with unspecified complications: Secondary | ICD-10-CM | POA: Diagnosis not present

## 2023-05-03 DIAGNOSIS — Z951 Presence of aortocoronary bypass graft: Secondary | ICD-10-CM | POA: Diagnosis not present

## 2023-05-03 DIAGNOSIS — R000 Tachycardia, unspecified: Secondary | ICD-10-CM | POA: Diagnosis not present

## 2023-05-03 DIAGNOSIS — Z794 Long term (current) use of insulin: Secondary | ICD-10-CM | POA: Diagnosis not present

## 2023-05-03 DIAGNOSIS — R058 Other specified cough: Secondary | ICD-10-CM | POA: Diagnosis not present

## 2023-05-03 DIAGNOSIS — R9389 Abnormal findings on diagnostic imaging of other specified body structures: Secondary | ICD-10-CM | POA: Diagnosis not present

## 2023-05-03 DIAGNOSIS — E871 Hypo-osmolality and hyponatremia: Secondary | ICD-10-CM | POA: Diagnosis not present

## 2023-05-03 DIAGNOSIS — R918 Other nonspecific abnormal finding of lung field: Secondary | ICD-10-CM | POA: Diagnosis not present

## 2023-05-03 DIAGNOSIS — M779 Enthesopathy, unspecified: Secondary | ICD-10-CM | POA: Diagnosis not present

## 2023-05-03 DIAGNOSIS — G4733 Obstructive sleep apnea (adult) (pediatric): Secondary | ICD-10-CM | POA: Diagnosis not present

## 2023-05-03 DIAGNOSIS — M25561 Pain in right knee: Secondary | ICD-10-CM | POA: Diagnosis not present

## 2023-05-03 DIAGNOSIS — M24561 Contracture, right knee: Secondary | ICD-10-CM | POA: Diagnosis not present

## 2023-05-03 DIAGNOSIS — Z7902 Long term (current) use of antithrombotics/antiplatelets: Secondary | ICD-10-CM | POA: Diagnosis not present

## 2023-05-03 DIAGNOSIS — E1165 Type 2 diabetes mellitus with hyperglycemia: Secondary | ICD-10-CM | POA: Diagnosis not present

## 2023-05-03 DIAGNOSIS — R509 Fever, unspecified: Secondary | ICD-10-CM | POA: Diagnosis not present

## 2023-05-03 DIAGNOSIS — G8918 Other acute postprocedural pain: Secondary | ICD-10-CM | POA: Diagnosis not present

## 2023-05-03 DIAGNOSIS — I5022 Chronic systolic (congestive) heart failure: Secondary | ICD-10-CM | POA: Diagnosis not present

## 2023-05-03 DIAGNOSIS — Z5986 Financial insecurity: Secondary | ICD-10-CM | POA: Diagnosis not present

## 2023-05-03 DIAGNOSIS — D62 Acute posthemorrhagic anemia: Secondary | ICD-10-CM | POA: Diagnosis not present

## 2023-05-03 DIAGNOSIS — I110 Hypertensive heart disease with heart failure: Secondary | ICD-10-CM | POA: Diagnosis not present

## 2023-05-03 DIAGNOSIS — M25461 Effusion, right knee: Secondary | ICD-10-CM | POA: Diagnosis not present

## 2023-05-03 DIAGNOSIS — K5900 Constipation, unspecified: Secondary | ICD-10-CM | POA: Diagnosis not present

## 2023-05-03 DIAGNOSIS — R5082 Postprocedural fever: Secondary | ICD-10-CM | POA: Diagnosis not present

## 2023-05-03 DIAGNOSIS — D696 Thrombocytopenia, unspecified: Secondary | ICD-10-CM | POA: Diagnosis not present

## 2023-05-03 DIAGNOSIS — I517 Cardiomegaly: Secondary | ICD-10-CM | POA: Diagnosis not present

## 2023-05-03 DIAGNOSIS — F17210 Nicotine dependence, cigarettes, uncomplicated: Secondary | ICD-10-CM | POA: Diagnosis not present

## 2023-05-03 DIAGNOSIS — M76891 Other specified enthesopathies of right lower limb, excluding foot: Secondary | ICD-10-CM | POA: Diagnosis not present

## 2023-05-03 DIAGNOSIS — Z95810 Presence of automatic (implantable) cardiac defibrillator: Secondary | ICD-10-CM | POA: Diagnosis not present

## 2023-05-03 DIAGNOSIS — I5020 Unspecified systolic (congestive) heart failure: Secondary | ICD-10-CM | POA: Diagnosis not present

## 2023-05-03 DIAGNOSIS — E1142 Type 2 diabetes mellitus with diabetic polyneuropathy: Secondary | ICD-10-CM | POA: Diagnosis not present

## 2023-05-03 DIAGNOSIS — Z0389 Encounter for observation for other suspected diseases and conditions ruled out: Secondary | ICD-10-CM | POA: Diagnosis not present

## 2023-05-03 DIAGNOSIS — Z96651 Presence of right artificial knee joint: Secondary | ICD-10-CM | POA: Diagnosis not present

## 2023-05-03 DIAGNOSIS — M1711 Unilateral primary osteoarthritis, right knee: Secondary | ICD-10-CM | POA: Diagnosis not present

## 2023-05-03 DIAGNOSIS — Z6837 Body mass index (BMI) 37.0-37.9, adult: Secondary | ICD-10-CM | POA: Diagnosis not present

## 2023-05-03 DIAGNOSIS — I255 Ischemic cardiomyopathy: Secondary | ICD-10-CM | POA: Diagnosis not present

## 2023-05-03 DIAGNOSIS — I252 Old myocardial infarction: Secondary | ICD-10-CM | POA: Diagnosis not present

## 2023-05-03 DIAGNOSIS — Z7982 Long term (current) use of aspirin: Secondary | ICD-10-CM | POA: Diagnosis not present

## 2023-05-03 DIAGNOSIS — Z9641 Presence of insulin pump (external) (internal): Secondary | ICD-10-CM | POA: Diagnosis not present

## 2023-05-03 DIAGNOSIS — E669 Obesity, unspecified: Secondary | ICD-10-CM | POA: Diagnosis not present

## 2023-05-06 DIAGNOSIS — Z0389 Encounter for observation for other suspected diseases and conditions ruled out: Secondary | ICD-10-CM | POA: Diagnosis not present

## 2023-05-06 LAB — CUP PACEART REMOTE DEVICE CHECK
Battery Remaining Longevity: 24 mo
Battery Remaining Percentage: 27 %
Battery Voltage: 2.81 V
Brady Statistic AP VP Percent: 1 %
Brady Statistic AP VS Percent: 4.9 %
Brady Statistic AS VP Percent: 28 %
Brady Statistic AS VS Percent: 65 %
Brady Statistic RA Percent Paced: 4.7 %
Brady Statistic RV Percent Paced: 29 %
Date Time Interrogation Session: 20240819031540
HighPow Impedance: 39 Ohm
HighPow Impedance: 39 Ohm
Implantable Lead Connection Status: 753985
Implantable Lead Connection Status: 753985
Implantable Lead Implant Date: 20160929
Implantable Lead Implant Date: 20160929
Implantable Lead Location: 753859
Implantable Lead Location: 753860
Implantable Lead Model: 7071
Implantable Pulse Generator Implant Date: 20160929
Lead Channel Impedance Value: 350 Ohm
Lead Channel Impedance Value: 400 Ohm
Lead Channel Pacing Threshold Amplitude: 0.75 V
Lead Channel Pacing Threshold Amplitude: 0.75 V
Lead Channel Pacing Threshold Pulse Width: 0.4 ms
Lead Channel Pacing Threshold Pulse Width: 0.5 ms
Lead Channel Sensing Intrinsic Amplitude: 3.8 mV
Lead Channel Sensing Intrinsic Amplitude: 8.7 mV
Lead Channel Setting Pacing Amplitude: 2 V
Lead Channel Setting Pacing Amplitude: 2.5 V
Lead Channel Setting Pacing Pulse Width: 0.5 ms
Lead Channel Setting Sensing Sensitivity: 0.5 mV
Pulse Gen Serial Number: 7306375

## 2023-05-07 ENCOUNTER — Ambulatory Visit (INDEPENDENT_AMBULATORY_CARE_PROVIDER_SITE_OTHER): Payer: Medicare Other

## 2023-05-07 DIAGNOSIS — I255 Ischemic cardiomyopathy: Secondary | ICD-10-CM | POA: Diagnosis not present

## 2023-05-09 ENCOUNTER — Telehealth: Payer: Self-pay | Admitting: *Deleted

## 2023-05-09 DIAGNOSIS — Z471 Aftercare following joint replacement surgery: Secondary | ICD-10-CM | POA: Diagnosis not present

## 2023-05-09 DIAGNOSIS — Z95810 Presence of automatic (implantable) cardiac defibrillator: Secondary | ICD-10-CM | POA: Diagnosis not present

## 2023-05-09 DIAGNOSIS — Z7902 Long term (current) use of antithrombotics/antiplatelets: Secondary | ICD-10-CM | POA: Diagnosis not present

## 2023-05-09 DIAGNOSIS — I2510 Atherosclerotic heart disease of native coronary artery without angina pectoris: Secondary | ICD-10-CM | POA: Diagnosis not present

## 2023-05-09 DIAGNOSIS — G4733 Obstructive sleep apnea (adult) (pediatric): Secondary | ICD-10-CM | POA: Diagnosis not present

## 2023-05-09 DIAGNOSIS — Z6837 Body mass index (BMI) 37.0-37.9, adult: Secondary | ICD-10-CM | POA: Diagnosis not present

## 2023-05-09 DIAGNOSIS — D62 Acute posthemorrhagic anemia: Secondary | ICD-10-CM | POA: Diagnosis not present

## 2023-05-09 DIAGNOSIS — I5022 Chronic systolic (congestive) heart failure: Secondary | ICD-10-CM | POA: Diagnosis not present

## 2023-05-09 DIAGNOSIS — E119 Type 2 diabetes mellitus without complications: Secondary | ICD-10-CM | POA: Diagnosis not present

## 2023-05-09 DIAGNOSIS — G4730 Sleep apnea, unspecified: Secondary | ICD-10-CM | POA: Diagnosis not present

## 2023-05-09 DIAGNOSIS — F32A Depression, unspecified: Secondary | ICD-10-CM | POA: Diagnosis not present

## 2023-05-09 DIAGNOSIS — K5900 Constipation, unspecified: Secondary | ICD-10-CM | POA: Diagnosis not present

## 2023-05-09 DIAGNOSIS — I255 Ischemic cardiomyopathy: Secondary | ICD-10-CM | POA: Diagnosis not present

## 2023-05-09 DIAGNOSIS — Z87891 Personal history of nicotine dependence: Secondary | ICD-10-CM | POA: Diagnosis not present

## 2023-05-09 DIAGNOSIS — Z96651 Presence of right artificial knee joint: Secondary | ICD-10-CM | POA: Diagnosis not present

## 2023-05-09 DIAGNOSIS — Z7985 Long-term (current) use of injectable non-insulin antidiabetic drugs: Secondary | ICD-10-CM | POA: Diagnosis not present

## 2023-05-09 DIAGNOSIS — E871 Hypo-osmolality and hyponatremia: Secondary | ICD-10-CM | POA: Diagnosis not present

## 2023-05-09 DIAGNOSIS — I110 Hypertensive heart disease with heart failure: Secondary | ICD-10-CM | POA: Diagnosis not present

## 2023-05-09 DIAGNOSIS — Z9852 Vasectomy status: Secondary | ICD-10-CM | POA: Diagnosis not present

## 2023-05-09 DIAGNOSIS — E785 Hyperlipidemia, unspecified: Secondary | ICD-10-CM | POA: Diagnosis not present

## 2023-05-09 DIAGNOSIS — Z951 Presence of aortocoronary bypass graft: Secondary | ICD-10-CM | POA: Diagnosis not present

## 2023-05-09 NOTE — Transitions of Care (Post Inpatient/ED Visit) (Signed)
05/09/2023  Name: Brian Williams MRN: [id number] DOB: Feb 12, 1961  Today's TOC FU Call Status: Today's TOC FU Call Status:: Successful TOC FU Call Completed TOC FU Call Complete Date: 05/09/23  Transition Care Management Follow-up Telephone Call Date of Discharge: 05/08/23 Discharge Facility: Other ([facility]) Name of Other ([facility]) Discharge Facility: [facility] Type of Discharge: Inpatient Admission Primary Inpatient Discharge Diagnosis:: acute postoperative pain How have you been since you were released from the hospital?: Better Any questions or concerns?: No  Items Reviewed: Did you receive and understand the discharge instructions provided?: Yes Medications obtained,verified, and reconciled?: Yes (Medications Reviewed) Any new allergies since your discharge?: No Dietary orders reviewed?: No Do you have support at home?: Yes People in Home: spouse Name of Support/Comfort Primary Source: Dorene Grebe  Medications Reviewed Today: Medications Reviewed Today     Reviewed by Luella Cook, RN (Case Manager) on 05/09/23 at 1224  Med List Status: <None>   Medication Order Taking? Sig Documenting Provider Last Dose Status Informant  aspirin EC 81 MG tablet [id number] Yes Take 81 mg by mouth daily.  Antonieta Iba, MD Taking Active Self, Pharmacy Records  clopidogrel (PLAVIX) 75 MG tablet [id number] Yes Take 1 tablet (75 mg total) by mouth daily. Doreene Nest, NP Taking Active Self, Pharmacy Records  ezetimibe (ZETIA) 10 MG tablet [id number] Yes Take 1 tablet (10 mg total) by mouth daily. For cholesterol. Doreene Nest, NP Taking Active Self, Pharmacy Records  furosemide (LASIX) 40 MG tablet [id number] Yes Take 80 mg by mouth daily. [provider] Taking Active   gemfibrozil (LOPID) 600 MG tablet [id number] Yes TAKE 1 TABLET BY MOUTH DAILY FOR CHOLESTEROL Doreene Nest, NP Taking Active   insulin regular human CONCENTRATED (HUMULIN R) 500 UNIT/ML  injection [id number] Yes Inject 0-25 Units into the skin daily at 12 noon. [provider] Taking Active Self, Pharmacy Records           Med Note Raynald Kemp Jan 18, 2022  2:11 PM)    metoprolol succinate (TOPROL-XL) 100 MG 24 hr tablet [id number] Yes Take 200 mg by mouth daily. [provider] Taking Active Self, Pharmacy Records  nitroGLYCERIN (NITROSTAT) 0.4 MG SL tablet [id number] Yes Place 1 tablet (0.4 mg total) under the tongue every 5 (five) minutes as needed for chest pain. Doreene Nest, NP Taking Active Self, Pharmacy Records  omega-3 acid ethyl esters (LOVAZA) 1 g capsule [id number] Yes Take 1 g by mouth 2 (two) times daily. [provider] Taking Active Self, Pharmacy Records  rosuvastatin (CRESTOR) 40 MG tablet [id number] Yes Take 1 tablet (40 mg total) by mouth daily. Antonieta Iba, MD Taking Active   sacubitril-valsartan [facility]) 49-51 MG [id number] Yes Take 1 tablet by mouth 2 (two) times daily. [provider] Taking Active Self, Pharmacy Records  Semaglutide, 2 MG/DOSE, (OZEMPIC, 2 MG/DOSE,) 8 MG/3ML SOPN [id number] Yes Inject 2 mg into the skin every Friday. [provider] Taking Active Self, Pharmacy Records  spironolactone (ALDACTONE) 25 MG tablet [id number] Yes Take 25 mg by mouth daily. [provider] Taking Active Self, Pharmacy Records            Home Care and Equipment/Supplies: Were Home Health Services Ordered?: Yes Name of Home Health Agency:: [facility] Has Agency set up a time to come to your home?: Yes First Home Health Visit Date: 05/09/23 Any new equipment or medical supplies ordered?: Yes Name of Medical supply agency?: [employer] Were you able  to get the equipment/medical supplies?: No (RN contacted [employer] and set up for patient to pick up local) Do you have any questions related to the use of the equipment/supplies?: Yes What questions do you have?: Patient was to receive a wheelchair.  Per [employer], pt did not qualify  sinc received a walker. RN made pt aware that he could purchase or rent  Functional Questionnaire: Do you need assistance with bathing/showering or dressing?: Yes Do you need assistance with meal preparation?: Yes Do you need assistance with eating?: No Do you have difficulty maintaining continence: No Do you need assistance with getting out of bed/getting out of a chair/moving?: Yes Do you have difficulty managing or taking your medications?: No  Follow up appointments reviewed: PCP Follow-up appointment confirmed?: Yes Date of PCP follow-up appointment?: 05/15/23 Follow-up Provider: Mordecai Maes Specialist [facility] Follow-up appointment confirmed?: Yes Date of Specialist follow-up appointment?: 05/17/23 Follow-Up Specialty Provider:: Genice Rouge Do you need transportation to your follow-up appointment?: No Do you understand care options if your condition(s) worsen?: Yes-patient verbalized understanding  SDOH Interventions Today    Flowsheet Row Most Recent Value  SDOH Interventions   Food Insecurity Interventions Intervention Not Indicated  Housing Interventions Intervention Not Indicated  Transportation Interventions Intervention Not Indicated, Patient Resources (Friends/Family)      Interventions Today    Flowsheet Row Most Recent Value  General Interventions   General Interventions Discussed/Reviewed General Interventions Discussed, General Interventions Reviewed, Doctor Visits, Durable Medical Equipment (DME)  Doctor Visits Discussed/Reviewed Doctor Visits Discussed, Doctor Visits Reviewed  Durable Medical Equipment (DME) Bed side commode, Wheelchair  [facility] sent name and address of place locally for patient to pick up 3 in 1. RN discussed the different uses for the 3 in 1. Patient does not qualify for the wheelchair.]  Wheelchair Standard  Exercise Interventions   Exercise Discussed/Reviewed Exercise Discussed  [[facility] is there for PT and OT]   Pharmacy Interventions   Pharmacy Dicussed/Reviewed Pharmacy Topics Discussed      TOC Interventions Today    Flowsheet Row Most Recent Value  TOC Interventions   TOC Interventions Discussed/Reviewed TOC Interventions Discussed, TOC Interventions Reviewed       Gean Maidens BSN RN [facility] [phone]

## 2023-05-13 ENCOUNTER — Telehealth: Payer: Self-pay | Admitting: Primary Care

## 2023-05-13 DIAGNOSIS — E785 Hyperlipidemia, unspecified: Secondary | ICD-10-CM | POA: Diagnosis not present

## 2023-05-13 DIAGNOSIS — I2510 Atherosclerotic heart disease of native coronary artery without angina pectoris: Secondary | ICD-10-CM | POA: Diagnosis not present

## 2023-05-13 DIAGNOSIS — I255 Ischemic cardiomyopathy: Secondary | ICD-10-CM | POA: Diagnosis not present

## 2023-05-13 DIAGNOSIS — E119 Type 2 diabetes mellitus without complications: Secondary | ICD-10-CM | POA: Diagnosis not present

## 2023-05-13 DIAGNOSIS — E871 Hypo-osmolality and hyponatremia: Secondary | ICD-10-CM | POA: Diagnosis not present

## 2023-05-13 DIAGNOSIS — I5022 Chronic systolic (congestive) heart failure: Secondary | ICD-10-CM | POA: Diagnosis not present

## 2023-05-13 DIAGNOSIS — G4730 Sleep apnea, unspecified: Secondary | ICD-10-CM | POA: Diagnosis not present

## 2023-05-13 DIAGNOSIS — D62 Acute posthemorrhagic anemia: Secondary | ICD-10-CM | POA: Diagnosis not present

## 2023-05-13 DIAGNOSIS — Z471 Aftercare following joint replacement surgery: Secondary | ICD-10-CM | POA: Diagnosis not present

## 2023-05-13 DIAGNOSIS — Z6837 Body mass index (BMI) 37.0-37.9, adult: Secondary | ICD-10-CM | POA: Diagnosis not present

## 2023-05-13 DIAGNOSIS — G4733 Obstructive sleep apnea (adult) (pediatric): Secondary | ICD-10-CM | POA: Diagnosis not present

## 2023-05-13 DIAGNOSIS — K5900 Constipation, unspecified: Secondary | ICD-10-CM | POA: Diagnosis not present

## 2023-05-13 DIAGNOSIS — Z7902 Long term (current) use of antithrombotics/antiplatelets: Secondary | ICD-10-CM | POA: Diagnosis not present

## 2023-05-13 DIAGNOSIS — I110 Hypertensive heart disease with heart failure: Secondary | ICD-10-CM | POA: Diagnosis not present

## 2023-05-13 DIAGNOSIS — F32A Depression, unspecified: Secondary | ICD-10-CM | POA: Diagnosis not present

## 2023-05-13 NOTE — Telephone Encounter (Signed)
Approved.  

## 2023-05-13 NOTE — Telephone Encounter (Signed)
Home Health verbal orders Caller Name: Bobette Mo, [place] Agency Name: [facility]  Callback number: [phone]  Requesting OT/PT/Skilled nursing/Social Work/Speech: OT  Reason: Pt had knee replacement recently   Frequency: 1wk 7  Please forward to [facility] pool or providers CMA

## 2023-05-14 NOTE — Telephone Encounter (Signed)
Called and advised Shireen with [facility] of the approval of the requested verbal orders for this patient. Advised to call back with any further questions.

## 2023-05-15 ENCOUNTER — Inpatient Hospital Stay: Payer: Medicare Other | Admitting: Nurse Practitioner

## 2023-05-17 DIAGNOSIS — M170 Bilateral primary osteoarthritis of knee: Secondary | ICD-10-CM | POA: Diagnosis not present

## 2023-05-17 DIAGNOSIS — Z96651 Presence of right artificial knee joint: Secondary | ICD-10-CM | POA: Diagnosis not present

## 2023-05-17 NOTE — Progress Notes (Signed)
Remote ICD transmission.   

## 2023-05-22 ENCOUNTER — Inpatient Hospital Stay: Payer: Medicare Other | Admitting: Primary Care

## 2023-05-23 ENCOUNTER — Telehealth: Payer: Self-pay

## 2023-05-23 ENCOUNTER — Encounter: Payer: Self-pay | Admitting: Primary Care

## 2023-05-23 DIAGNOSIS — E119 Type 2 diabetes mellitus without complications: Secondary | ICD-10-CM | POA: Diagnosis not present

## 2023-05-23 DIAGNOSIS — E871 Hypo-osmolality and hyponatremia: Secondary | ICD-10-CM | POA: Diagnosis not present

## 2023-05-23 DIAGNOSIS — G4733 Obstructive sleep apnea (adult) (pediatric): Secondary | ICD-10-CM | POA: Diagnosis not present

## 2023-05-23 DIAGNOSIS — I255 Ischemic cardiomyopathy: Secondary | ICD-10-CM | POA: Diagnosis not present

## 2023-05-23 DIAGNOSIS — E785 Hyperlipidemia, unspecified: Secondary | ICD-10-CM | POA: Diagnosis not present

## 2023-05-23 DIAGNOSIS — I5022 Chronic systolic (congestive) heart failure: Secondary | ICD-10-CM | POA: Diagnosis not present

## 2023-05-23 DIAGNOSIS — Z471 Aftercare following joint replacement surgery: Secondary | ICD-10-CM | POA: Diagnosis not present

## 2023-05-23 DIAGNOSIS — F32A Depression, unspecified: Secondary | ICD-10-CM | POA: Diagnosis not present

## 2023-05-23 DIAGNOSIS — Z7902 Long term (current) use of antithrombotics/antiplatelets: Secondary | ICD-10-CM | POA: Diagnosis not present

## 2023-05-23 DIAGNOSIS — Z6837 Body mass index (BMI) 37.0-37.9, adult: Secondary | ICD-10-CM | POA: Diagnosis not present

## 2023-05-23 DIAGNOSIS — G4730 Sleep apnea, unspecified: Secondary | ICD-10-CM | POA: Diagnosis not present

## 2023-05-23 DIAGNOSIS — I110 Hypertensive heart disease with heart failure: Secondary | ICD-10-CM | POA: Diagnosis not present

## 2023-05-23 DIAGNOSIS — I2510 Atherosclerotic heart disease of native coronary artery without angina pectoris: Secondary | ICD-10-CM | POA: Diagnosis not present

## 2023-05-23 DIAGNOSIS — D62 Acute posthemorrhagic anemia: Secondary | ICD-10-CM | POA: Diagnosis not present

## 2023-05-23 DIAGNOSIS — E118 Type 2 diabetes mellitus with unspecified complications: Secondary | ICD-10-CM | POA: Diagnosis not present

## 2023-05-23 DIAGNOSIS — K5900 Constipation, unspecified: Secondary | ICD-10-CM | POA: Diagnosis not present

## 2023-05-23 NOTE — Telephone Encounter (Addendum)
Alert received from [employer]:  Alert remote transmission: VT with ATP There were 14 long NSVT arrhythmia detected and one VT episode that was successfully converted after one burst of ATP.  Sent to triage high priority per protocol There were 6 atrial arrhythmias detected that were all less than one minute  Per review of Pt chart-appears Pt is being followed for his device by [facility].  Will contact Pt to confirm this and transfer remotes.  Left message requesting call back.  Call back received from Pt.  Per Pt he had recent knee surgery and has been in a lot of pain.  He states he has not been eating well.  He also states he stopped taking all of his regular medications after the knee surgery.    Pt states he remembers this episode of tachycardia with ATP.  States he was awake and it felt like his heart/chest was moving back and forth.  He denies chest pain, SOB, dizziness. Advised per [place] DMV-no driving x 6 months s/p treatment from device.  He indicates understanding.  Pt has primary cardiology appointment at [facility] on 05/28/2023. Advised Pt would discuss with Dr. Graciela Husbands and call back with further advisement.  Pt understands importance of taking his medications.

## 2023-05-23 NOTE — Telephone Encounter (Signed)
Forwarding since note states you will f/u with Dr. Graciela Husbands

## 2023-05-24 DIAGNOSIS — D62 Acute posthemorrhagic anemia: Secondary | ICD-10-CM | POA: Diagnosis not present

## 2023-05-24 DIAGNOSIS — E785 Hyperlipidemia, unspecified: Secondary | ICD-10-CM | POA: Diagnosis not present

## 2023-05-24 DIAGNOSIS — I110 Hypertensive heart disease with heart failure: Secondary | ICD-10-CM | POA: Diagnosis not present

## 2023-05-24 DIAGNOSIS — Z471 Aftercare following joint replacement surgery: Secondary | ICD-10-CM | POA: Diagnosis not present

## 2023-05-24 DIAGNOSIS — I2510 Atherosclerotic heart disease of native coronary artery without angina pectoris: Secondary | ICD-10-CM | POA: Diagnosis not present

## 2023-05-24 DIAGNOSIS — F32A Depression, unspecified: Secondary | ICD-10-CM | POA: Diagnosis not present

## 2023-05-24 DIAGNOSIS — I5022 Chronic systolic (congestive) heart failure: Secondary | ICD-10-CM | POA: Diagnosis not present

## 2023-05-24 DIAGNOSIS — G4733 Obstructive sleep apnea (adult) (pediatric): Secondary | ICD-10-CM | POA: Diagnosis not present

## 2023-05-24 DIAGNOSIS — Z7902 Long term (current) use of antithrombotics/antiplatelets: Secondary | ICD-10-CM | POA: Diagnosis not present

## 2023-05-24 DIAGNOSIS — K5900 Constipation, unspecified: Secondary | ICD-10-CM | POA: Diagnosis not present

## 2023-05-24 DIAGNOSIS — Z6837 Body mass index (BMI) 37.0-37.9, adult: Secondary | ICD-10-CM | POA: Diagnosis not present

## 2023-05-24 DIAGNOSIS — E871 Hypo-osmolality and hyponatremia: Secondary | ICD-10-CM | POA: Diagnosis not present

## 2023-05-24 DIAGNOSIS — E119 Type 2 diabetes mellitus without complications: Secondary | ICD-10-CM | POA: Diagnosis not present

## 2023-05-24 DIAGNOSIS — I255 Ischemic cardiomyopathy: Secondary | ICD-10-CM | POA: Diagnosis not present

## 2023-05-24 DIAGNOSIS — G4730 Sleep apnea, unspecified: Secondary | ICD-10-CM | POA: Diagnosis not present

## 2023-05-28 DIAGNOSIS — I2510 Atherosclerotic heart disease of native coronary artery without angina pectoris: Secondary | ICD-10-CM | POA: Diagnosis not present

## 2023-05-28 DIAGNOSIS — Z95810 Presence of automatic (implantable) cardiac defibrillator: Secondary | ICD-10-CM | POA: Diagnosis not present

## 2023-05-28 DIAGNOSIS — G4733 Obstructive sleep apnea (adult) (pediatric): Secondary | ICD-10-CM | POA: Diagnosis not present

## 2023-05-28 DIAGNOSIS — I5022 Chronic systolic (congestive) heart failure: Secondary | ICD-10-CM | POA: Diagnosis not present

## 2023-05-28 DIAGNOSIS — I10 Essential (primary) hypertension: Secondary | ICD-10-CM | POA: Diagnosis not present

## 2023-05-28 NOTE — Telephone Encounter (Signed)
Pt calling because he thought he received a new message from this nurse.  Advised message he is indicating was from call last week.  Pt states he went to [facility] in [place] last week for blood work.    This nurse did not order blood work.  No lab results available.  Pt states he was advised to get blood work via [employer].   No Mychart messages from [facility].  Pt follows with Dr. Cherly Hensen with [facility].  Possible Pt was confused as to who ordered lab work.  Will continue to follow.  Pt has scheduled follow up with Dr. Cherly Hensen this afternoon.

## 2023-05-29 DIAGNOSIS — G4730 Sleep apnea, unspecified: Secondary | ICD-10-CM | POA: Diagnosis not present

## 2023-05-29 DIAGNOSIS — Z471 Aftercare following joint replacement surgery: Secondary | ICD-10-CM | POA: Diagnosis not present

## 2023-05-29 DIAGNOSIS — E119 Type 2 diabetes mellitus without complications: Secondary | ICD-10-CM | POA: Diagnosis not present

## 2023-05-29 DIAGNOSIS — I2510 Atherosclerotic heart disease of native coronary artery without angina pectoris: Secondary | ICD-10-CM | POA: Diagnosis not present

## 2023-05-29 DIAGNOSIS — F32A Depression, unspecified: Secondary | ICD-10-CM | POA: Diagnosis not present

## 2023-05-29 DIAGNOSIS — G4733 Obstructive sleep apnea (adult) (pediatric): Secondary | ICD-10-CM | POA: Diagnosis not present

## 2023-05-29 DIAGNOSIS — Z7902 Long term (current) use of antithrombotics/antiplatelets: Secondary | ICD-10-CM | POA: Diagnosis not present

## 2023-05-29 DIAGNOSIS — I5022 Chronic systolic (congestive) heart failure: Secondary | ICD-10-CM | POA: Diagnosis not present

## 2023-05-29 DIAGNOSIS — D62 Acute posthemorrhagic anemia: Secondary | ICD-10-CM | POA: Diagnosis not present

## 2023-05-29 DIAGNOSIS — E785 Hyperlipidemia, unspecified: Secondary | ICD-10-CM | POA: Diagnosis not present

## 2023-05-29 DIAGNOSIS — I255 Ischemic cardiomyopathy: Secondary | ICD-10-CM | POA: Diagnosis not present

## 2023-05-29 DIAGNOSIS — K5900 Constipation, unspecified: Secondary | ICD-10-CM | POA: Diagnosis not present

## 2023-05-29 DIAGNOSIS — Z6837 Body mass index (BMI) 37.0-37.9, adult: Secondary | ICD-10-CM | POA: Diagnosis not present

## 2023-05-29 DIAGNOSIS — I110 Hypertensive heart disease with heart failure: Secondary | ICD-10-CM | POA: Diagnosis not present

## 2023-05-29 DIAGNOSIS — E871 Hypo-osmolality and hyponatremia: Secondary | ICD-10-CM | POA: Diagnosis not present

## 2023-05-29 NOTE — Telephone Encounter (Signed)
Message sent to scheduling to schedule f/u with Pt in [place].  Unsure if Pt kept his appt with Dr. Cherly Hensen yesterday afternoon, no notes in CareEverywhere.

## 2023-05-31 ENCOUNTER — Telehealth: Payer: Self-pay | Admitting: Primary Care

## 2023-05-31 DIAGNOSIS — F32A Depression, unspecified: Secondary | ICD-10-CM | POA: Diagnosis not present

## 2023-05-31 DIAGNOSIS — E785 Hyperlipidemia, unspecified: Secondary | ICD-10-CM | POA: Diagnosis not present

## 2023-05-31 DIAGNOSIS — E119 Type 2 diabetes mellitus without complications: Secondary | ICD-10-CM | POA: Diagnosis not present

## 2023-05-31 DIAGNOSIS — G4733 Obstructive sleep apnea (adult) (pediatric): Secondary | ICD-10-CM | POA: Diagnosis not present

## 2023-05-31 DIAGNOSIS — G4730 Sleep apnea, unspecified: Secondary | ICD-10-CM | POA: Diagnosis not present

## 2023-05-31 DIAGNOSIS — E871 Hypo-osmolality and hyponatremia: Secondary | ICD-10-CM | POA: Diagnosis not present

## 2023-05-31 DIAGNOSIS — I5022 Chronic systolic (congestive) heart failure: Secondary | ICD-10-CM | POA: Diagnosis not present

## 2023-05-31 DIAGNOSIS — I255 Ischemic cardiomyopathy: Secondary | ICD-10-CM | POA: Diagnosis not present

## 2023-05-31 DIAGNOSIS — I110 Hypertensive heart disease with heart failure: Secondary | ICD-10-CM | POA: Diagnosis not present

## 2023-05-31 DIAGNOSIS — I2510 Atherosclerotic heart disease of native coronary artery without angina pectoris: Secondary | ICD-10-CM | POA: Diagnosis not present

## 2023-05-31 DIAGNOSIS — Z471 Aftercare following joint replacement surgery: Secondary | ICD-10-CM | POA: Diagnosis not present

## 2023-05-31 DIAGNOSIS — K5900 Constipation, unspecified: Secondary | ICD-10-CM | POA: Diagnosis not present

## 2023-05-31 DIAGNOSIS — Z95810 Presence of automatic (implantable) cardiac defibrillator: Secondary | ICD-10-CM | POA: Diagnosis not present

## 2023-05-31 DIAGNOSIS — Z6837 Body mass index (BMI) 37.0-37.9, adult: Secondary | ICD-10-CM | POA: Diagnosis not present

## 2023-05-31 DIAGNOSIS — D62 Acute posthemorrhagic anemia: Secondary | ICD-10-CM | POA: Diagnosis not present

## 2023-05-31 DIAGNOSIS — Z7902 Long term (current) use of antithrombotics/antiplatelets: Secondary | ICD-10-CM | POA: Diagnosis not present

## 2023-05-31 NOTE — Telephone Encounter (Signed)
Jon Gills  from [facility] call in stated that the patient will not participate in no physical therapy due to being in so much pain and would like a call back at [phone].

## 2023-06-03 DIAGNOSIS — E871 Hypo-osmolality and hyponatremia: Secondary | ICD-10-CM | POA: Diagnosis not present

## 2023-06-03 DIAGNOSIS — E785 Hyperlipidemia, unspecified: Secondary | ICD-10-CM | POA: Diagnosis not present

## 2023-06-03 DIAGNOSIS — I110 Hypertensive heart disease with heart failure: Secondary | ICD-10-CM | POA: Diagnosis not present

## 2023-06-03 DIAGNOSIS — K5900 Constipation, unspecified: Secondary | ICD-10-CM | POA: Diagnosis not present

## 2023-06-03 DIAGNOSIS — G4733 Obstructive sleep apnea (adult) (pediatric): Secondary | ICD-10-CM | POA: Diagnosis not present

## 2023-06-03 DIAGNOSIS — Z7902 Long term (current) use of antithrombotics/antiplatelets: Secondary | ICD-10-CM | POA: Diagnosis not present

## 2023-06-03 DIAGNOSIS — E119 Type 2 diabetes mellitus without complications: Secondary | ICD-10-CM | POA: Diagnosis not present

## 2023-06-03 DIAGNOSIS — I2510 Atherosclerotic heart disease of native coronary artery without angina pectoris: Secondary | ICD-10-CM | POA: Diagnosis not present

## 2023-06-03 DIAGNOSIS — I255 Ischemic cardiomyopathy: Secondary | ICD-10-CM | POA: Diagnosis not present

## 2023-06-03 DIAGNOSIS — Z6837 Body mass index (BMI) 37.0-37.9, adult: Secondary | ICD-10-CM | POA: Diagnosis not present

## 2023-06-03 DIAGNOSIS — Z471 Aftercare following joint replacement surgery: Secondary | ICD-10-CM | POA: Diagnosis not present

## 2023-06-03 DIAGNOSIS — G4730 Sleep apnea, unspecified: Secondary | ICD-10-CM | POA: Diagnosis not present

## 2023-06-03 DIAGNOSIS — I5022 Chronic systolic (congestive) heart failure: Secondary | ICD-10-CM | POA: Diagnosis not present

## 2023-06-03 DIAGNOSIS — D62 Acute posthemorrhagic anemia: Secondary | ICD-10-CM | POA: Diagnosis not present

## 2023-06-03 DIAGNOSIS — F32A Depression, unspecified: Secondary | ICD-10-CM | POA: Diagnosis not present

## 2023-06-03 NOTE — Telephone Encounter (Signed)
Left message with Jon Gills at [facility] to return call to office.

## 2023-06-04 DIAGNOSIS — I2510 Atherosclerotic heart disease of native coronary artery without angina pectoris: Secondary | ICD-10-CM | POA: Diagnosis not present

## 2023-06-04 DIAGNOSIS — I5022 Chronic systolic (congestive) heart failure: Secondary | ICD-10-CM | POA: Diagnosis not present

## 2023-06-04 DIAGNOSIS — F32A Depression, unspecified: Secondary | ICD-10-CM | POA: Diagnosis not present

## 2023-06-04 DIAGNOSIS — D62 Acute posthemorrhagic anemia: Secondary | ICD-10-CM | POA: Diagnosis not present

## 2023-06-04 DIAGNOSIS — I255 Ischemic cardiomyopathy: Secondary | ICD-10-CM | POA: Diagnosis not present

## 2023-06-04 DIAGNOSIS — Z6837 Body mass index (BMI) 37.0-37.9, adult: Secondary | ICD-10-CM | POA: Diagnosis not present

## 2023-06-04 DIAGNOSIS — E119 Type 2 diabetes mellitus without complications: Secondary | ICD-10-CM | POA: Diagnosis not present

## 2023-06-04 DIAGNOSIS — Z7902 Long term (current) use of antithrombotics/antiplatelets: Secondary | ICD-10-CM | POA: Diagnosis not present

## 2023-06-04 DIAGNOSIS — E871 Hypo-osmolality and hyponatremia: Secondary | ICD-10-CM | POA: Diagnosis not present

## 2023-06-04 DIAGNOSIS — G4733 Obstructive sleep apnea (adult) (pediatric): Secondary | ICD-10-CM | POA: Diagnosis not present

## 2023-06-04 DIAGNOSIS — K5900 Constipation, unspecified: Secondary | ICD-10-CM | POA: Diagnosis not present

## 2023-06-04 DIAGNOSIS — E785 Hyperlipidemia, unspecified: Secondary | ICD-10-CM | POA: Diagnosis not present

## 2023-06-04 DIAGNOSIS — Z471 Aftercare following joint replacement surgery: Secondary | ICD-10-CM | POA: Diagnosis not present

## 2023-06-04 DIAGNOSIS — G4730 Sleep apnea, unspecified: Secondary | ICD-10-CM | POA: Diagnosis not present

## 2023-06-04 DIAGNOSIS — I110 Hypertensive heart disease with heart failure: Secondary | ICD-10-CM | POA: Diagnosis not present

## 2023-06-04 NOTE — Telephone Encounter (Signed)
Will defer PT and pain management to orthopedics. See notes in care everywhere.

## 2023-06-04 NOTE — Telephone Encounter (Signed)
Called and spoke with Brian Williams. She states that patient will not participate in PT due to the amount of pain he is in. He told the PT that his referral to the pain clinic was denied this past week (do not know who placed referral). [name] wanted to inform PCP of this info

## 2023-06-07 DIAGNOSIS — Z471 Aftercare following joint replacement surgery: Secondary | ICD-10-CM | POA: Diagnosis not present

## 2023-06-07 DIAGNOSIS — G4733 Obstructive sleep apnea (adult) (pediatric): Secondary | ICD-10-CM | POA: Diagnosis not present

## 2023-06-07 DIAGNOSIS — I5022 Chronic systolic (congestive) heart failure: Secondary | ICD-10-CM | POA: Diagnosis not present

## 2023-06-07 DIAGNOSIS — E876 Hypokalemia: Secondary | ICD-10-CM | POA: Diagnosis not present

## 2023-06-07 DIAGNOSIS — D62 Acute posthemorrhagic anemia: Secondary | ICD-10-CM | POA: Diagnosis not present

## 2023-06-07 DIAGNOSIS — I110 Hypertensive heart disease with heart failure: Secondary | ICD-10-CM | POA: Diagnosis not present

## 2023-06-07 DIAGNOSIS — E871 Hypo-osmolality and hyponatremia: Secondary | ICD-10-CM | POA: Diagnosis not present

## 2023-06-07 DIAGNOSIS — I255 Ischemic cardiomyopathy: Secondary | ICD-10-CM | POA: Diagnosis not present

## 2023-06-07 DIAGNOSIS — F32A Depression, unspecified: Secondary | ICD-10-CM | POA: Diagnosis not present

## 2023-06-07 DIAGNOSIS — I2510 Atherosclerotic heart disease of native coronary artery without angina pectoris: Secondary | ICD-10-CM | POA: Diagnosis not present

## 2023-06-07 DIAGNOSIS — E119 Type 2 diabetes mellitus without complications: Secondary | ICD-10-CM | POA: Diagnosis not present

## 2023-06-07 DIAGNOSIS — Z6837 Body mass index (BMI) 37.0-37.9, adult: Secondary | ICD-10-CM | POA: Diagnosis not present

## 2023-06-07 DIAGNOSIS — G4730 Sleep apnea, unspecified: Secondary | ICD-10-CM | POA: Diagnosis not present

## 2023-06-07 DIAGNOSIS — K5900 Constipation, unspecified: Secondary | ICD-10-CM | POA: Diagnosis not present

## 2023-06-07 DIAGNOSIS — Z7902 Long term (current) use of antithrombotics/antiplatelets: Secondary | ICD-10-CM | POA: Diagnosis not present

## 2023-06-07 DIAGNOSIS — E785 Hyperlipidemia, unspecified: Secondary | ICD-10-CM | POA: Diagnosis not present

## 2023-06-08 DIAGNOSIS — Z471 Aftercare following joint replacement surgery: Secondary | ICD-10-CM | POA: Diagnosis not present

## 2023-06-08 DIAGNOSIS — Z7985 Long-term (current) use of injectable non-insulin antidiabetic drugs: Secondary | ICD-10-CM | POA: Diagnosis not present

## 2023-06-08 DIAGNOSIS — Z95810 Presence of automatic (implantable) cardiac defibrillator: Secondary | ICD-10-CM | POA: Diagnosis not present

## 2023-06-08 DIAGNOSIS — Z96651 Presence of right artificial knee joint: Secondary | ICD-10-CM | POA: Diagnosis not present

## 2023-06-08 DIAGNOSIS — G4733 Obstructive sleep apnea (adult) (pediatric): Secondary | ICD-10-CM | POA: Diagnosis not present

## 2023-06-08 DIAGNOSIS — Z9852 Vasectomy status: Secondary | ICD-10-CM | POA: Diagnosis not present

## 2023-06-08 DIAGNOSIS — Z6837 Body mass index (BMI) 37.0-37.9, adult: Secondary | ICD-10-CM | POA: Diagnosis not present

## 2023-06-08 DIAGNOSIS — Z87891 Personal history of nicotine dependence: Secondary | ICD-10-CM | POA: Diagnosis not present

## 2023-06-08 DIAGNOSIS — Z951 Presence of aortocoronary bypass graft: Secondary | ICD-10-CM | POA: Diagnosis not present

## 2023-06-08 DIAGNOSIS — I5022 Chronic systolic (congestive) heart failure: Secondary | ICD-10-CM | POA: Diagnosis not present

## 2023-06-08 DIAGNOSIS — F32A Depression, unspecified: Secondary | ICD-10-CM | POA: Diagnosis not present

## 2023-06-08 DIAGNOSIS — D62 Acute posthemorrhagic anemia: Secondary | ICD-10-CM | POA: Diagnosis not present

## 2023-06-08 DIAGNOSIS — I255 Ischemic cardiomyopathy: Secondary | ICD-10-CM | POA: Diagnosis not present

## 2023-06-08 DIAGNOSIS — E785 Hyperlipidemia, unspecified: Secondary | ICD-10-CM | POA: Diagnosis not present

## 2023-06-08 DIAGNOSIS — I2510 Atherosclerotic heart disease of native coronary artery without angina pectoris: Secondary | ICD-10-CM | POA: Diagnosis not present

## 2023-06-08 DIAGNOSIS — G4730 Sleep apnea, unspecified: Secondary | ICD-10-CM | POA: Diagnosis not present

## 2023-06-08 DIAGNOSIS — K5900 Constipation, unspecified: Secondary | ICD-10-CM | POA: Diagnosis not present

## 2023-06-08 DIAGNOSIS — I110 Hypertensive heart disease with heart failure: Secondary | ICD-10-CM | POA: Diagnosis not present

## 2023-06-08 DIAGNOSIS — Z7902 Long term (current) use of antithrombotics/antiplatelets: Secondary | ICD-10-CM | POA: Diagnosis not present

## 2023-06-08 DIAGNOSIS — E119 Type 2 diabetes mellitus without complications: Secondary | ICD-10-CM | POA: Diagnosis not present

## 2023-06-08 DIAGNOSIS — E871 Hypo-osmolality and hyponatremia: Secondary | ICD-10-CM | POA: Diagnosis not present

## 2023-06-10 ENCOUNTER — Telehealth: Payer: Self-pay | Admitting: Primary Care

## 2023-06-10 DIAGNOSIS — D62 Acute posthemorrhagic anemia: Secondary | ICD-10-CM | POA: Diagnosis not present

## 2023-06-10 DIAGNOSIS — E785 Hyperlipidemia, unspecified: Secondary | ICD-10-CM | POA: Diagnosis not present

## 2023-06-10 DIAGNOSIS — E119 Type 2 diabetes mellitus without complications: Secondary | ICD-10-CM | POA: Diagnosis not present

## 2023-06-10 DIAGNOSIS — Z7902 Long term (current) use of antithrombotics/antiplatelets: Secondary | ICD-10-CM | POA: Diagnosis not present

## 2023-06-10 DIAGNOSIS — E871 Hypo-osmolality and hyponatremia: Secondary | ICD-10-CM | POA: Diagnosis not present

## 2023-06-10 DIAGNOSIS — G4730 Sleep apnea, unspecified: Secondary | ICD-10-CM | POA: Diagnosis not present

## 2023-06-10 DIAGNOSIS — K5900 Constipation, unspecified: Secondary | ICD-10-CM | POA: Diagnosis not present

## 2023-06-10 DIAGNOSIS — Z6837 Body mass index (BMI) 37.0-37.9, adult: Secondary | ICD-10-CM | POA: Diagnosis not present

## 2023-06-10 DIAGNOSIS — I2510 Atherosclerotic heart disease of native coronary artery without angina pectoris: Secondary | ICD-10-CM | POA: Diagnosis not present

## 2023-06-10 DIAGNOSIS — I5022 Chronic systolic (congestive) heart failure: Secondary | ICD-10-CM | POA: Diagnosis not present

## 2023-06-10 DIAGNOSIS — F32A Depression, unspecified: Secondary | ICD-10-CM | POA: Diagnosis not present

## 2023-06-10 DIAGNOSIS — Z471 Aftercare following joint replacement surgery: Secondary | ICD-10-CM | POA: Diagnosis not present

## 2023-06-10 DIAGNOSIS — G4733 Obstructive sleep apnea (adult) (pediatric): Secondary | ICD-10-CM | POA: Diagnosis not present

## 2023-06-10 DIAGNOSIS — I110 Hypertensive heart disease with heart failure: Secondary | ICD-10-CM | POA: Diagnosis not present

## 2023-06-10 DIAGNOSIS — I255 Ischemic cardiomyopathy: Secondary | ICD-10-CM | POA: Diagnosis not present

## 2023-06-10 NOTE — Telephone Encounter (Signed)
I recommend that Brian Williams notify his surgeon's office to make sure they are okay with this.

## 2023-06-10 NOTE — Telephone Encounter (Signed)
Shireen, Acupuncturist contacted the office to leave a message for patient's pcp. Caller says patient is requesting to be discharged form physical therapy, so he will no longer need appointments. Can be reached at [phone] if needed

## 2023-06-11 DIAGNOSIS — G4733 Obstructive sleep apnea (adult) (pediatric): Secondary | ICD-10-CM | POA: Diagnosis not present

## 2023-06-11 DIAGNOSIS — I110 Hypertensive heart disease with heart failure: Secondary | ICD-10-CM | POA: Diagnosis not present

## 2023-06-11 DIAGNOSIS — G4730 Sleep apnea, unspecified: Secondary | ICD-10-CM | POA: Diagnosis not present

## 2023-06-11 DIAGNOSIS — I255 Ischemic cardiomyopathy: Secondary | ICD-10-CM | POA: Diagnosis not present

## 2023-06-11 DIAGNOSIS — E119 Type 2 diabetes mellitus without complications: Secondary | ICD-10-CM | POA: Diagnosis not present

## 2023-06-11 DIAGNOSIS — F32A Depression, unspecified: Secondary | ICD-10-CM | POA: Diagnosis not present

## 2023-06-11 DIAGNOSIS — E785 Hyperlipidemia, unspecified: Secondary | ICD-10-CM | POA: Diagnosis not present

## 2023-06-11 DIAGNOSIS — Z471 Aftercare following joint replacement surgery: Secondary | ICD-10-CM | POA: Diagnosis not present

## 2023-06-11 DIAGNOSIS — Z6837 Body mass index (BMI) 37.0-37.9, adult: Secondary | ICD-10-CM | POA: Diagnosis not present

## 2023-06-11 DIAGNOSIS — I5022 Chronic systolic (congestive) heart failure: Secondary | ICD-10-CM | POA: Diagnosis not present

## 2023-06-11 DIAGNOSIS — E871 Hypo-osmolality and hyponatremia: Secondary | ICD-10-CM | POA: Diagnosis not present

## 2023-06-11 DIAGNOSIS — D62 Acute posthemorrhagic anemia: Secondary | ICD-10-CM | POA: Diagnosis not present

## 2023-06-11 DIAGNOSIS — Z7902 Long term (current) use of antithrombotics/antiplatelets: Secondary | ICD-10-CM | POA: Diagnosis not present

## 2023-06-11 DIAGNOSIS — K5900 Constipation, unspecified: Secondary | ICD-10-CM | POA: Diagnosis not present

## 2023-06-11 DIAGNOSIS — I2510 Atherosclerotic heart disease of native coronary artery without angina pectoris: Secondary | ICD-10-CM | POA: Diagnosis not present

## 2023-06-11 NOTE — Telephone Encounter (Signed)
Called and notified Shireen of [place] message.

## 2023-06-12 ENCOUNTER — Other Ambulatory Visit: Payer: Self-pay

## 2023-06-12 DIAGNOSIS — E785 Hyperlipidemia, unspecified: Secondary | ICD-10-CM

## 2023-06-13 MED ORDER — GEMFIBROZIL 600 MG PO TABS: ORAL_TABLET | ORAL | 1 refills | Status: AC

## 2023-06-14 DIAGNOSIS — Z794 Long term (current) use of insulin: Secondary | ICD-10-CM | POA: Diagnosis not present

## 2023-06-14 DIAGNOSIS — D649 Anemia, unspecified: Secondary | ICD-10-CM | POA: Diagnosis not present

## 2023-06-14 DIAGNOSIS — I5020 Unspecified systolic (congestive) heart failure: Secondary | ICD-10-CM | POA: Diagnosis not present

## 2023-06-14 DIAGNOSIS — E119 Type 2 diabetes mellitus without complications: Secondary | ICD-10-CM | POA: Diagnosis not present

## 2023-06-15 DIAGNOSIS — M19071 Primary osteoarthritis, right ankle and foot: Secondary | ICD-10-CM | POA: Diagnosis not present

## 2023-06-17 DIAGNOSIS — G4733 Obstructive sleep apnea (adult) (pediatric): Secondary | ICD-10-CM | POA: Diagnosis not present

## 2023-06-17 DIAGNOSIS — D62 Acute posthemorrhagic anemia: Secondary | ICD-10-CM | POA: Diagnosis not present

## 2023-06-17 DIAGNOSIS — E871 Hypo-osmolality and hyponatremia: Secondary | ICD-10-CM | POA: Diagnosis not present

## 2023-06-17 DIAGNOSIS — E119 Type 2 diabetes mellitus without complications: Secondary | ICD-10-CM | POA: Diagnosis not present

## 2023-06-17 DIAGNOSIS — Z7902 Long term (current) use of antithrombotics/antiplatelets: Secondary | ICD-10-CM | POA: Diagnosis not present

## 2023-06-17 DIAGNOSIS — G4730 Sleep apnea, unspecified: Secondary | ICD-10-CM | POA: Diagnosis not present

## 2023-06-17 DIAGNOSIS — K5900 Constipation, unspecified: Secondary | ICD-10-CM | POA: Diagnosis not present

## 2023-06-17 DIAGNOSIS — Z471 Aftercare following joint replacement surgery: Secondary | ICD-10-CM | POA: Diagnosis not present

## 2023-06-17 DIAGNOSIS — F32A Depression, unspecified: Secondary | ICD-10-CM | POA: Diagnosis not present

## 2023-06-17 DIAGNOSIS — I5022 Chronic systolic (congestive) heart failure: Secondary | ICD-10-CM | POA: Diagnosis not present

## 2023-06-17 DIAGNOSIS — I2510 Atherosclerotic heart disease of native coronary artery without angina pectoris: Secondary | ICD-10-CM | POA: Diagnosis not present

## 2023-06-17 DIAGNOSIS — Z6837 Body mass index (BMI) 37.0-37.9, adult: Secondary | ICD-10-CM | POA: Diagnosis not present

## 2023-06-17 DIAGNOSIS — I255 Ischemic cardiomyopathy: Secondary | ICD-10-CM | POA: Diagnosis not present

## 2023-06-17 DIAGNOSIS — E785 Hyperlipidemia, unspecified: Secondary | ICD-10-CM | POA: Diagnosis not present

## 2023-06-17 DIAGNOSIS — I110 Hypertensive heart disease with heart failure: Secondary | ICD-10-CM | POA: Diagnosis not present

## 2023-06-20 DIAGNOSIS — D62 Acute posthemorrhagic anemia: Secondary | ICD-10-CM | POA: Diagnosis not present

## 2023-06-20 DIAGNOSIS — I110 Hypertensive heart disease with heart failure: Secondary | ICD-10-CM | POA: Diagnosis not present

## 2023-06-20 DIAGNOSIS — G4733 Obstructive sleep apnea (adult) (pediatric): Secondary | ICD-10-CM | POA: Diagnosis not present

## 2023-06-20 DIAGNOSIS — Z7902 Long term (current) use of antithrombotics/antiplatelets: Secondary | ICD-10-CM | POA: Diagnosis not present

## 2023-06-20 DIAGNOSIS — I255 Ischemic cardiomyopathy: Secondary | ICD-10-CM | POA: Diagnosis not present

## 2023-06-20 DIAGNOSIS — I2510 Atherosclerotic heart disease of native coronary artery without angina pectoris: Secondary | ICD-10-CM | POA: Diagnosis not present

## 2023-06-20 DIAGNOSIS — Z6837 Body mass index (BMI) 37.0-37.9, adult: Secondary | ICD-10-CM | POA: Diagnosis not present

## 2023-06-20 DIAGNOSIS — E119 Type 2 diabetes mellitus without complications: Secondary | ICD-10-CM | POA: Diagnosis not present

## 2023-06-20 DIAGNOSIS — F32A Depression, unspecified: Secondary | ICD-10-CM | POA: Diagnosis not present

## 2023-06-20 DIAGNOSIS — E871 Hypo-osmolality and hyponatremia: Secondary | ICD-10-CM | POA: Diagnosis not present

## 2023-06-20 DIAGNOSIS — G4730 Sleep apnea, unspecified: Secondary | ICD-10-CM | POA: Diagnosis not present

## 2023-06-20 DIAGNOSIS — E785 Hyperlipidemia, unspecified: Secondary | ICD-10-CM | POA: Diagnosis not present

## 2023-06-20 DIAGNOSIS — Z471 Aftercare following joint replacement surgery: Secondary | ICD-10-CM | POA: Diagnosis not present

## 2023-06-20 DIAGNOSIS — K5900 Constipation, unspecified: Secondary | ICD-10-CM | POA: Diagnosis not present

## 2023-06-20 DIAGNOSIS — I5022 Chronic systolic (congestive) heart failure: Secondary | ICD-10-CM | POA: Diagnosis not present

## 2023-06-22 DIAGNOSIS — E118 Type 2 diabetes mellitus with unspecified complications: Secondary | ICD-10-CM | POA: Diagnosis not present

## 2023-06-24 DIAGNOSIS — Z720 Tobacco use: Secondary | ICD-10-CM | POA: Diagnosis not present

## 2023-06-24 DIAGNOSIS — I5022 Chronic systolic (congestive) heart failure: Secondary | ICD-10-CM | POA: Diagnosis not present

## 2023-06-24 DIAGNOSIS — G4733 Obstructive sleep apnea (adult) (pediatric): Secondary | ICD-10-CM | POA: Diagnosis not present

## 2023-06-24 DIAGNOSIS — I255 Ischemic cardiomyopathy: Secondary | ICD-10-CM | POA: Diagnosis not present

## 2023-06-24 DIAGNOSIS — Z95810 Presence of automatic (implantable) cardiac defibrillator: Secondary | ICD-10-CM | POA: Diagnosis not present

## 2023-06-24 DIAGNOSIS — Z794 Long term (current) use of insulin: Secondary | ICD-10-CM | POA: Diagnosis not present

## 2023-06-24 DIAGNOSIS — I10 Essential (primary) hypertension: Secondary | ICD-10-CM | POA: Diagnosis not present

## 2023-06-24 DIAGNOSIS — I2510 Atherosclerotic heart disease of native coronary artery without angina pectoris: Secondary | ICD-10-CM | POA: Diagnosis not present

## 2023-06-24 DIAGNOSIS — E1142 Type 2 diabetes mellitus with diabetic polyneuropathy: Secondary | ICD-10-CM | POA: Diagnosis not present

## 2023-06-24 DIAGNOSIS — Z96651 Presence of right artificial knee joint: Secondary | ICD-10-CM | POA: Diagnosis not present

## 2023-06-24 DIAGNOSIS — E782 Mixed hyperlipidemia: Secondary | ICD-10-CM | POA: Diagnosis not present

## 2023-06-25 DIAGNOSIS — Z471 Aftercare following joint replacement surgery: Secondary | ICD-10-CM | POA: Diagnosis not present

## 2023-06-25 DIAGNOSIS — I110 Hypertensive heart disease with heart failure: Secondary | ICD-10-CM | POA: Diagnosis not present

## 2023-06-25 DIAGNOSIS — Z7902 Long term (current) use of antithrombotics/antiplatelets: Secondary | ICD-10-CM | POA: Diagnosis not present

## 2023-06-25 DIAGNOSIS — E119 Type 2 diabetes mellitus without complications: Secondary | ICD-10-CM | POA: Diagnosis not present

## 2023-06-25 DIAGNOSIS — G4733 Obstructive sleep apnea (adult) (pediatric): Secondary | ICD-10-CM | POA: Diagnosis not present

## 2023-06-25 DIAGNOSIS — I255 Ischemic cardiomyopathy: Secondary | ICD-10-CM | POA: Diagnosis not present

## 2023-06-25 DIAGNOSIS — E785 Hyperlipidemia, unspecified: Secondary | ICD-10-CM | POA: Diagnosis not present

## 2023-06-25 DIAGNOSIS — K5900 Constipation, unspecified: Secondary | ICD-10-CM | POA: Diagnosis not present

## 2023-06-25 DIAGNOSIS — G4730 Sleep apnea, unspecified: Secondary | ICD-10-CM | POA: Diagnosis not present

## 2023-06-25 DIAGNOSIS — E871 Hypo-osmolality and hyponatremia: Secondary | ICD-10-CM | POA: Diagnosis not present

## 2023-06-25 DIAGNOSIS — I2510 Atherosclerotic heart disease of native coronary artery without angina pectoris: Secondary | ICD-10-CM | POA: Diagnosis not present

## 2023-06-25 DIAGNOSIS — D62 Acute posthemorrhagic anemia: Secondary | ICD-10-CM | POA: Diagnosis not present

## 2023-06-25 DIAGNOSIS — Z6837 Body mass index (BMI) 37.0-37.9, adult: Secondary | ICD-10-CM | POA: Diagnosis not present

## 2023-06-25 DIAGNOSIS — I5022 Chronic systolic (congestive) heart failure: Secondary | ICD-10-CM | POA: Diagnosis not present

## 2023-06-25 DIAGNOSIS — F32A Depression, unspecified: Secondary | ICD-10-CM | POA: Diagnosis not present

## 2023-07-05 DIAGNOSIS — I5020 Unspecified systolic (congestive) heart failure: Secondary | ICD-10-CM | POA: Diagnosis not present

## 2023-07-05 DIAGNOSIS — E119 Type 2 diabetes mellitus without complications: Secondary | ICD-10-CM | POA: Diagnosis not present

## 2023-07-05 DIAGNOSIS — Z794 Long term (current) use of insulin: Secondary | ICD-10-CM | POA: Diagnosis not present

## 2023-07-12 DIAGNOSIS — G2581 Restless legs syndrome: Secondary | ICD-10-CM | POA: Diagnosis not present

## 2023-07-12 DIAGNOSIS — M79604 Pain in right leg: Secondary | ICD-10-CM | POA: Diagnosis not present

## 2023-07-12 DIAGNOSIS — R200 Anesthesia of skin: Secondary | ICD-10-CM | POA: Diagnosis not present

## 2023-07-12 DIAGNOSIS — G629 Polyneuropathy, unspecified: Secondary | ICD-10-CM | POA: Diagnosis not present

## 2023-07-15 DIAGNOSIS — I110 Hypertensive heart disease with heart failure: Secondary | ICD-10-CM | POA: Diagnosis not present

## 2023-07-26 DIAGNOSIS — E118 Type 2 diabetes mellitus with unspecified complications: Secondary | ICD-10-CM | POA: Diagnosis not present

## 2023-08-01 NOTE — Progress Notes (Signed)
Cardiology Office Note Date:  08/02/2023  Patient ID:  Brian, Williams 24-Oct-1960, MRN [id number] PCP:  Doreene Nest, NP  Cardiologist:  Julien Nordmann, MD HF cardiologist: Nicky Pugh, MD at [facility] Electrophysiologist: Sherryl Manges, MD    Chief Complaint: routine device follow-up  History of Present Illness: Brian Williams is a 62 y.o. male with PMH notable for CAD s/p CABG (2017), VT s/p ICD, HFrEF, HTN, HLD, OSA, T2DM; seen today for Sherryl Manges, MD for routine electrophysiology followup.  He is s/p VT and PVC ablation 2013 at [facility]. He last saw Dr. Graciela Husbands 05/2022, was doing well at that time without angina Device clinic was alerted 05/2023 for NSVT episodes and VT episode requiring ATP.  He had a TKA mid-August and self-dc'd GDMT and subsequently had NSVT and VT requiring ATP. Once medications we restarted, his NSVT appeared to quiet.  He saw HF cardiology NP Caryl Ada at [facility] 10/7 and was doing well, GDMT further titrated  On follow-up today, he is feeling well. Continues to heal from knee surgery. He denies chest pain, chest pressure, palpitations, dyspnea, PND, orthopnea, nausea, vomiting, dizziness, syncope, edema, weight gain, or early satiety.    He forgot to take his medications this AM.    Device Information: [employer] Jude dual chamber ICD, imp 2009; dx ICM, VT Gen change 05/2015  Past Medical History:  Diagnosis Date   Acute appendicitis 04/03/2021   Acute pyelonephritis 09/07/2021   AICD (automatic cardioverter/defibrillator) present    Allergy    Anxiety    takes Xanax as needed   Appendicitis    Arthritis    back,knees,right shoulder   Back pain    Bacteremia due to Klebsiella pneumoniae 09/07/2021   Cardiomyopathy (HCC)    CHF (congestive heart failure) (HCC)    takes Lasix and Aldactone daily   Coronary artery disease    takes Plavix daily   Depression    takes Zoloft daily   Diabetes mellitus without complication (HCC)    Humulin R and  Farxiga daily.Fasting blood sugar runs 140   GERD (gastroesophageal reflux disease)    Headache    History of bronchitis    History of colon polyps    benign   History of hiatal hernia    Hyperlipidemia    takes Fenofibrate,Crestor, and Zetia daily   Hypertension    takes Entresto and Coreg daily   MI (myocardial infarction) (HCC) 2001   Multifocal pneumonia 09/07/2021   Obesity    Peripheral neuropathy    Persistent cough for 3 weeks or longer 07/19/2022   Pneumonia    history of-last time about 14 yrs ago   PONV (postoperative nausea and vomiting)    after knee surgery 25 yrs ago b/p stayed elevated for a while   Presence of permanent cardiac pacemaker    Sepsis (HCC) 09/06/2021   Shortness of breath dyspnea    with exertion   Sleep apnea    uses CPAP nightly   Ventricular tachycardia (HCC)    s/p RFCA PVCs 2013    Past Surgical History:  Procedure Laterality Date   APPENDECTOMY     BACK SURGERY     CARDIAC CATHETERIZATION     CARDIAC CATHETERIZATION Left 05/10/2016   Procedure: Left Heart Cath and Coronary Angiography;  Surgeon: Antonieta Iba, MD;  Location: [facility] INVASIVE CV LAB;  Service: Cardiovascular;  Laterality: Left;   CARDIAC DEFIBRILLATOR PLACEMENT  10/16/2007   ICD Model number 2207-36 serial number  [id number]   CARDIAC ELECTROPHYSIOLOGY STUDY AND ABLATION     CHOLECYSTECTOMY  2001   COLONOSCOPY     COLONOSCOPY WITH PROPOFOL N/A 08/28/2018   Procedure: COLONOSCOPY WITH PROPOFOL;  Surgeon: Wyline Mood, MD;  Location: [facility] ENDOSCOPY;  Service: Gastroenterology;  Laterality: N/A;   COLONOSCOPY WITH PROPOFOL N/A 10/06/2021   Procedure: COLONOSCOPY WITH PROPOFOL;  Surgeon: Wyline Mood, MD;  Location: [facility] ENDOSCOPY;  Service: Gastroenterology;  Laterality: N/A;   CORONARY ANGIOPLASTY WITH STENT PLACEMENT     7 stents   CORONARY ARTERY BYPASS GRAFT N/A 05/24/2016   Procedure: CORONARY ARTERY BYPASS GRAFTING (CABG) x four , using left internal mammary artery and  left leg greater saphenous vein harvested endoscopically;  Surgeon: Alleen Borne, MD;  Location: [facility] OR;  Service: Open Heart Surgery;  Laterality: N/A;   EP IMPLANTABLE DEVICE N/A 06/16/2015   Procedure: ICD Generator Changeout;  Surgeon: [facility] Salvia, MD;  Location: [facility] INVASIVE CV LAB;  Service: Cardiovascular;  Laterality: N/A;   ESOPHAGOGASTRODUODENOSCOPY     INSERT / REPLACE / REMOVE PACEMAKER     KNEE SURGERY     bilateral knee    [name] WITHOUT CARDIOVERSION N/A 05/24/2016   Procedure: TRANSESOPHAGEAL ECHOCARDIOGRAM ([name]);  Surgeon: Alleen Borne, MD;  Location: [facility] OR;  Service: Open Heart Surgery;  Laterality: N/A;   Ulnar Nerve surgery Left 09/19/2022   VASECTOMY      Current Outpatient Medications  Medication Instructions   aspirin EC 81 mg, Oral, Daily   clopidogrel (PLAVIX) 75 mg, Oral, Daily   ezetimibe (ZETIA) 10 mg, Oral, Daily, For cholesterol.   furosemide (LASIX) 80 mg, Oral, Daily   gemfibrozil (LOPID) 600 MG tablet TAKE 1 TABLET BY MOUTH DAILY FOR CHOLESTEROL   HUMULIN R 0-25 Units, Subcutaneous, Daily   metoprolol succinate (TOPROL-XL) 200 mg, Oral, Daily   nitroGLYCERIN (NITROSTAT) 0.4 mg, Sublingual, Every 5 min PRN   omega-3 acid ethyl esters (LOVAZA) 1 g, Oral, 2 times daily   Ozempic (2 MG/DOSE) 2 mg, Subcutaneous, Every Fri   pregabalin (LYRICA) 75 mg, Oral, Daily   rosuvastatin (CRESTOR) 40 mg, Oral, Daily   sacubitril-valsartan (ENTRESTO) 49-51 MG 1 tablet, Oral, 2 times daily   spironolactone (ALDACTONE) 25 mg, Oral, Daily    Social History:  The patient  reports that he quit smoking about 5 years ago. His smoking use included cigarettes. He started smoking about 30 years ago. He has a 25 pack-year smoking history. He has never used smokeless tobacco. He reports that he does not drink alcohol and does not use drugs.   Family History:  The patient's family history includes Heart attack (age of onset: 45) in his mother; Heart attack (age of onset: 68) in  his father; Heart disease in his maternal grandmother and maternal uncle; Hypertension in his father, maternal uncle, and mother; Melanoma in his mother; Stroke in his maternal grandmother.  ROS:  Please see the history of present illness. All other systems are reviewed and otherwise negative.   PHYSICAL EXAM:  VS:  BP 120/60 (BP Location: Left Arm, Patient Position: Sitting, Cuff Size: Normal)   Pulse (!) 131   Ht 5\' 11"  (1.803 m)   Wt 248 lb 2 oz (112.5 kg)   SpO2 97%   BMI 34.61 kg/m  BMI: Body mass index is 34.61 kg/m.  GEN- The patient is well appearing, alert and oriented x 3 today.   Lungs- Clear to ausculation bilaterally, normal work of breathing.  Heart- Regular rate and  rhythm, no murmurs, rubs or gallops Extremities- 1+ peripheral edema, warm, dry Skin-  device pocket well-healed, no tethering   Device interrogation done today and reviewed by myself:  Battery 1.6 years Patient presented in slow VT, cycle length of 450-460 (~130bpm) He self-converted to AS-VS during interrogation Lead thresholds, impedence, sensing stable  Multiple brief VF episodes 9/23, too brief for ATP No changes made today  EKG is ordered. Personal review of EKG from today shows:    EKG Interpretation Date/Time:  Friday August 02 2023 14:42:09 EST Ventricular Rate:  131 PR Interval:    QRS Duration:  120 QT Interval:  360 QTC Calculation: 531 R Axis:   -49  Text Interpretation: Ventricular tachycardia  Right bundle branch block  Left axis deviation  Left ventricular hypertrophy with repolarization abnormality ( R in aVL )  Inferior infarct , age undetermined  When compared with ECG of 06-Sep-2021 09:35,  Wide QRS tachycardia has replaced Sinus rhythm  Confirmed by Sherie Don [phone]) on 08/02/2023 2:47:15 PM    Reconfirmed by Sherie Don [phone]) on 08/02/2023 5:00:02 PM    Recent Labs: 08/23/2022: Pro B Natriuretic peptide (BNP) 53.0 11/14/2022: ALT 30; BUN 19; Creatinine, Ser 1.01;  Hemoglobin 13.3; Magnesium 1.6; Platelets 168.0; Potassium 3.8; Sodium 137; TSH 1.92  08/15/2022: Cholesterol 109; Direct LDL 48.0; HDL 29.80; Total CHOL/HDL Ratio 4; Triglycerides 364.0; VLDL 72.8   CrCl cannot be calculated (Patient's most recent lab result is older than the maximum 21 days allowed.).   Wt Readings from Last 3 Encounters:  08/02/23 248 lb 2 oz (112.5 kg)  04/05/23 263 lb (119.3 kg)  01/15/23 264 lb (119.7 kg)     Additional studies reviewed include: Previous EP, cardiology notes.   TTE, 01/31/2023 [facility])   1. The left ventricle is mildly to moderately dilated in size with normal wall thickness.    2. The left ventricular systolic function is severely decreased, LVEF is visually estimated at 30%.    3. There is decreased contractile function involving the mid anteroseptal and basal anteroseptal segment(s).    4. There is grade I diastolic dysfunction (impaired relaxation).    5. The right ventricle is normal in size, with normal systolic function.   TTE, 10/12/2020 [facility])   1. Technically difficult study due to chest wall/lung interference.    2. The left ventricle is mildly to moderately dilated in size with normal wall thickness.    3. The left ventricular systolic function is severely decreased, LVEF is visually estimated at 20-25%.    4. The right ventricle is not well visualized but probably normal in size, with normal systolic function.   LHC, 07/08/2019 ([facility]) 1. There is Significant 3-vessel coronary artery disease including 60% stenosis of distal LMCA, complete occlusion of the mLAD & mRCA and 80-90% stenosis of OM2 & OM3.  2. Patent LIMA-mLAD, VG-rPDA and VG-OM2. VG-D known to be occluded.  3. Normal right and left ventricular filling pressures.  4. Normal cardiac output.    ASSESSMENT AND PLAN:  #) NSVT #) brief VF #) CAD s/p CABG #) PVCs #) HFrEF S/p VT and PVC ablation 2013 at [facility] Patient presented to clinic in HDS slow VT that  self-converted to sinus rhythm during appointment. He admits to missing medications this AM Discussed with Dr. Lalla Brothers Given his history of CAD, recommend ischemic evaluation at earliest convenience Recommended patient proceed to ER for evaluation, patient preferred to go to [facility] for continuity of care Given that he was in  sinus rhythm and hemodynamically stable, ok to proceed by private vehicle.          Current medicines are reviewed at length with the patient today.   The patient does not have concerns regarding his medicines.  The following changes were made today:  none  Labs/ tests ordered today include:  Orders Placed This Encounter  Procedures   EKG 12-Lead     Disposition: Follow up with Dr. Graciela Husbands or EP APP  6 weeks   Proceed to [facility] ER today   Signed, Sherie Don, NP  08/02/23  5:10 PM  Electrophysiology [facility]

## 2023-08-02 ENCOUNTER — Ambulatory Visit: Payer: Medicare Other | Attending: Cardiology | Admitting: Cardiology

## 2023-08-02 ENCOUNTER — Encounter: Payer: Self-pay | Admitting: Cardiology

## 2023-08-02 VITALS — BP 120/60 | HR 131 | Ht 71.0 in | Wt 248.1 lb

## 2023-08-02 DIAGNOSIS — I25118 Atherosclerotic heart disease of native coronary artery with other forms of angina pectoris: Secondary | ICD-10-CM

## 2023-08-02 DIAGNOSIS — I255 Ischemic cardiomyopathy: Secondary | ICD-10-CM | POA: Diagnosis not present

## 2023-08-02 DIAGNOSIS — Z7982 Long term (current) use of aspirin: Secondary | ICD-10-CM | POA: Diagnosis not present

## 2023-08-02 DIAGNOSIS — R079 Chest pain, unspecified: Secondary | ICD-10-CM | POA: Diagnosis not present

## 2023-08-02 DIAGNOSIS — Z95810 Presence of automatic (implantable) cardiac defibrillator: Secondary | ICD-10-CM | POA: Diagnosis not present

## 2023-08-02 DIAGNOSIS — I493 Ventricular premature depolarization: Secondary | ICD-10-CM

## 2023-08-02 DIAGNOSIS — Z87891 Personal history of nicotine dependence: Secondary | ICD-10-CM | POA: Diagnosis not present

## 2023-08-02 DIAGNOSIS — I2510 Atherosclerotic heart disease of native coronary artery without angina pectoris: Secondary | ICD-10-CM | POA: Diagnosis not present

## 2023-08-02 DIAGNOSIS — I10 Essential (primary) hypertension: Secondary | ICD-10-CM

## 2023-08-02 DIAGNOSIS — Z79899 Other long term (current) drug therapy: Secondary | ICD-10-CM | POA: Diagnosis not present

## 2023-08-02 DIAGNOSIS — I5022 Chronic systolic (congestive) heart failure: Secondary | ICD-10-CM

## 2023-08-02 DIAGNOSIS — G4733 Obstructive sleep apnea (adult) (pediatric): Secondary | ICD-10-CM | POA: Diagnosis not present

## 2023-08-02 DIAGNOSIS — I110 Hypertensive heart disease with heart failure: Secondary | ICD-10-CM | POA: Diagnosis not present

## 2023-08-02 DIAGNOSIS — E785 Hyperlipidemia, unspecified: Secondary | ICD-10-CM | POA: Diagnosis not present

## 2023-08-02 DIAGNOSIS — Z794 Long term (current) use of insulin: Secondary | ICD-10-CM | POA: Diagnosis not present

## 2023-08-02 DIAGNOSIS — I471 Supraventricular tachycardia, unspecified: Secondary | ICD-10-CM | POA: Diagnosis not present

## 2023-08-02 DIAGNOSIS — I472 Ventricular tachycardia, unspecified: Secondary | ICD-10-CM | POA: Diagnosis not present

## 2023-08-02 DIAGNOSIS — I442 Atrioventricular block, complete: Secondary | ICD-10-CM | POA: Diagnosis not present

## 2023-08-02 DIAGNOSIS — E119 Type 2 diabetes mellitus without complications: Secondary | ICD-10-CM | POA: Diagnosis not present

## 2023-08-02 DIAGNOSIS — I420 Dilated cardiomyopathy: Secondary | ICD-10-CM

## 2023-08-02 LAB — CUP PACEART INCLINIC DEVICE CHECK
Battery Remaining Longevity: 20 mo
Brady Statistic RA Percent Paced: 5.3 %
Brady Statistic RV Percent Paced: 29 %
Date Time Interrogation Session: 20241115171523
HighPow Impedance: 41.8904
Implantable Lead Connection Status: 753985
Implantable Lead Connection Status: 753985
Implantable Lead Implant Date: 20160929
Implantable Lead Implant Date: 20160929
Implantable Lead Location: 753859
Implantable Lead Location: 753860
Implantable Lead Model: 7071
Implantable Pulse Generator Implant Date: 20160929
Lead Channel Impedance Value: 350 Ohm
Lead Channel Impedance Value: 400 Ohm
Lead Channel Pacing Threshold Amplitude: 0.75 V
Lead Channel Pacing Threshold Amplitude: 0.75 V
Lead Channel Pacing Threshold Amplitude: 1 V
Lead Channel Pacing Threshold Amplitude: 1 V
Lead Channel Pacing Threshold Pulse Width: 0.4 ms
Lead Channel Pacing Threshold Pulse Width: 0.4 ms
Lead Channel Pacing Threshold Pulse Width: 0.5 ms
Lead Channel Pacing Threshold Pulse Width: 0.5 ms
Lead Channel Sensing Intrinsic Amplitude: 11.7 mV
Lead Channel Sensing Intrinsic Amplitude: 3.7 mV
Lead Channel Setting Pacing Amplitude: 2 V
Lead Channel Setting Pacing Amplitude: 2.5 V
Lead Channel Setting Pacing Pulse Width: 0.5 ms
Lead Channel Setting Sensing Sensitivity: 0.5 mV
Pulse Gen Serial Number: 7306375

## 2023-08-02 NOTE — Patient Instructions (Signed)
Medication Instructions:  Your physician recommends that you continue on your current medications as directed. Please refer to the Current Medication list given to you today.   *If you need a refill on your cardiac medications before your next appointment, please call your pharmacy*   Lab Work: No labs ordered today    Testing/Procedures: No test ordered today    Follow-Up: At [facility], you and your health needs are our priority.  As part of our continuing mission to provide you with exceptional heart care, we have created designated Provider Care Teams.  These Care Teams include your primary Cardiologist (physician) and Advanced Practice Providers (APPs -  Physician Assistants and Nurse Practitioners) who all work together to provide you with the care you need, when you need it.  We recommend signing up for the patient portal called "MyChart".  Sign up information is provided on this After Visit Summary.  MyChart is used to connect with patients for Virtual Visits (Telemedicine).  Patients are able to view lab/test results, encounter notes, upcoming appointments, etc.  Non-urgent messages can be sent to your provider as well.   To learn more about what you can do with MyChart, go to [email or web address].    Your next appointment:   6 week(s)  Provider:   Sherryl Manges, MD

## 2023-08-04 DIAGNOSIS — Z95810 Presence of automatic (implantable) cardiac defibrillator: Secondary | ICD-10-CM | POA: Diagnosis not present

## 2023-08-05 ENCOUNTER — Telehealth: Payer: Self-pay | Admitting: Internal Medicine

## 2023-08-05 ENCOUNTER — Telehealth: Payer: Self-pay

## 2023-08-05 NOTE — Telephone Encounter (Signed)
Sent requested information to provider at fax number he provided. He was appreciative with no further questions at this time.

## 2023-08-05 NOTE — Telephone Encounter (Signed)
Called patient, scheduled for Monday next week with Luella Cook, NP per patient for next week appointment-  He is scheduled. No further questions/concerns.

## 2023-08-05 NOTE — Telephone Encounter (Signed)
-----   Message from [place] R[place] sent at 08/03/2023  5:48 PM EST ----- Regarding: appt needed I saw this patient late Friday afternoon where he presented in slow VT. He was sent to ER, he preferred [facility]. [facility] did not feel that admission was necessary.  Please call patient to schedule an appt with me or Graciela Husbands to adjust his VT zones on his device.

## 2023-08-05 NOTE — Telephone Encounter (Signed)
Patient is requesting that a copy of his EKG be sent as well as his remote check. Be sent to Caryl Ada at the [facility] cardiology. Please advise

## 2023-08-06 ENCOUNTER — Ambulatory Visit: Payer: Medicare Other | Attending: Internal Medicine

## 2023-08-06 DIAGNOSIS — I255 Ischemic cardiomyopathy: Secondary | ICD-10-CM | POA: Diagnosis not present

## 2023-08-06 DIAGNOSIS — I5022 Chronic systolic (congestive) heart failure: Secondary | ICD-10-CM

## 2023-08-06 LAB — CUP PACEART REMOTE DEVICE CHECK
Battery Remaining Longevity: 19 mo
Battery Remaining Percentage: 22 %
Battery Voltage: 2.78 V
Brady Statistic AP VP Percent: 1.1 %
Brady Statistic AP VS Percent: 6.2 %
Brady Statistic AS VP Percent: 25 %
Brady Statistic AS VS Percent: 63 %
Brady Statistic RA Percent Paced: 4.7 %
Brady Statistic RV Percent Paced: 27 %
Date Time Interrogation Session: 20241119055503
HighPow Impedance: 40 Ohm
HighPow Impedance: 40 Ohm
Implantable Lead Connection Status: 753985
Implantable Lead Connection Status: 753985
Implantable Lead Implant Date: 20160929
Implantable Lead Implant Date: 20160929
Implantable Lead Location: 753859
Implantable Lead Location: 753860
Implantable Lead Model: 7071
Implantable Pulse Generator Implant Date: 20160929
Lead Channel Impedance Value: 350 Ohm
Lead Channel Impedance Value: 400 Ohm
Lead Channel Pacing Threshold Amplitude: 0.75 V
Lead Channel Pacing Threshold Amplitude: 1 V
Lead Channel Pacing Threshold Pulse Width: 0.4 ms
Lead Channel Pacing Threshold Pulse Width: 0.5 ms
Lead Channel Sensing Intrinsic Amplitude: 11.7 mV
Lead Channel Sensing Intrinsic Amplitude: 3.6 mV
Lead Channel Setting Pacing Amplitude: 2 V
Lead Channel Setting Pacing Amplitude: 2.5 V
Lead Channel Setting Pacing Pulse Width: 0.5 ms
Lead Channel Setting Sensing Sensitivity: 0.5 mV
Pulse Gen Serial Number: 7306375
Zone Setting Status: 755011

## 2023-08-09 DIAGNOSIS — M170 Bilateral primary osteoarthritis of knee: Secondary | ICD-10-CM | POA: Diagnosis not present

## 2023-08-09 DIAGNOSIS — E1142 Type 2 diabetes mellitus with diabetic polyneuropathy: Secondary | ICD-10-CM | POA: Diagnosis not present

## 2023-08-09 DIAGNOSIS — Z720 Tobacco use: Secondary | ICD-10-CM | POA: Diagnosis not present

## 2023-08-09 DIAGNOSIS — Z96651 Presence of right artificial knee joint: Secondary | ICD-10-CM | POA: Diagnosis not present

## 2023-08-11 NOTE — Progress Notes (Unsigned)
Cardiology Office Note Date:  08/12/2023  Patient ID:  Brian Williams, Brian Williams 1961/08/09, MRN [id number] PCP:  Brian Nest, NP  Cardiologist:  Brian Nordmann, MD HF cardiologist: Brian Pugh, MD at [facility] Electrophysiologist: Brian Manges, MD    Chief Complaint: NSVT follow-up  History of Present Illness: Brian Williams is a 62 y.o. male with PMH notable for CAD s/p CABG (2017), VT s/p ICD, HFrEF, HTN, HLD, OSA, T2DM; seen today for Brian Manges, MD for routine electrophysiology followup.  He is s/p VT and PVC ablation 2013 at [facility]. He last saw Dr. Graciela Williams 05/2022, was doing well at that time without angina Device clinic was alerted 05/2023 for NSVT episodes and VT episode requiring ATP.  He had a TKA mid-August and self-dc'd GDMT and subsequently had NSVT and VF requiring ATP. Once medications we restarted, his NSVT appeared to quiet.  He saw HF cardiology NP Caryl Ada at [facility] 10/7 and was doing well, GDMT further titrated, no plans for ischemic evaluation given that his VT was quiescent once back on GDMT.  I saw him for routine device follow-up 11/15 where he presented to appt in asymptomatic, HDS, slow VT with 450-439ms cycle length. He self-converted during the appointment. It was recommended that he proceed to ER for ischemic eval and workup. He went to [facility] ER, where they did not appreciate his VT and he was discharged.   On follow-up today, he continues to feel well. He has not heard from [facility] regarding ischemic workup or LHC.    He denies chest pain, chest pressure, palpitations, dyspnea, PND, orthopnea, nausea, vomiting, dizziness, syncope, edema, weight gain, or early satiety.      Device Information: [employer] Jude dual chamber ICD, imp 2009; dx ICM, VT Gen change 05/2015    ROS:  Please see the history of present illness. All other systems are reviewed and otherwise negative.   PHYSICAL EXAM:  VS:  BP (!) 96/54 (BP Location: Left Arm, Patient Position: Sitting,  Cuff Size: Large)   Pulse 79   Ht 5\' 11"  (1.803 m)   Wt 246 lb 6.4 oz (111.8 kg)   BMI 34.37 kg/m  BMI: Body mass index is 34.37 kg/m.  GEN- The patient is well appearing, alert and oriented x 3 today.   Lungs- Clear to ausculation bilaterally, normal work of breathing.  Heart- Regular rate and rhythm, no murmurs, rubs or gallops Extremities- 1+ peripheral edema, warm, dry Skin-  device pocket well-healed, no tethering   Device interrogation done today and reviewed by myself:  Battery 1.5 years Lead thresholds, impedence, sensing stable  No episodes VP 40% CorVue elevated, but trending down Reduced VT monitor zone to 130bpm No changes made today  EKG is ordered. Personal review of EKG from today shows:    EKG Interpretation Date/Time:  Monday August 12 2023 10:11:10 EST Ventricular Rate:  79 PR Interval:  312 QRS Duration:  204 QT Interval:  472 QTC Calculation: 541 R Axis:   132  Text Interpretation: Atrial-sensed ventricular-paced rhythm with prolonged AV conduction with occasional Premature ventricular complexes Confirmed by Sherie Don [phone]) on 08/12/2023 10:13:38 AM        Additional studies reviewed include: Previous EP, cardiology notes.   TTE, 01/31/2023 [facility])   1. The left ventricle is mildly to moderately dilated in size with normal wall thickness.    2. The left ventricular systolic function is severely decreased, LVEF is visually estimated at 30%.    3. There is  decreased contractile function involving the mid anteroseptal and basal anteroseptal segment(s).    4. There is grade I diastolic dysfunction (impaired relaxation).    5. The right ventricle is normal in size, with normal systolic function.   TTE, 10/12/2020 [facility])   1. Technically difficult study due to chest wall/lung interference.    2. The left ventricle is mildly to moderately dilated in size with normal wall thickness.    3. The left ventricular systolic function is severely decreased,  LVEF is visually estimated at 20-25%.    4. The right ventricle is not well visualized but probably normal in size, with normal systolic function.   LHC, 07/08/2019 ([facility]) 1. There is Significant 3-vessel coronary artery disease including 60% stenosis of distal LMCA, complete occlusion of the mLAD & mRCA and 80-90% stenosis of OM2 & OM3.  2. Patent LIMA-mLAD, VG-rPDA and VG-OM2. VG-D known to be occluded.  3. Normal right and left ventricular filling pressures.  4. Normal cardiac output.    ASSESSMENT AND PLAN:  #) NSVT #) brief VF #) CAD s/p CABG #) PVCs #) HFrEF S/p VT and PVC ablation 2013 at [facility] Recently presented to clinic in slow VT with 450-411ms cycle length Reduced VT monitor zone to 130bpm Restart amiodarone 200mg  daily Recent TSH and LFTs at [facility] stable Recommended further discussion with HF providers regarding ischemic eval, will reach out to NP Waters to follow-up Consider BiV upgrade   Current medicines are reviewed at length with the patient today.   The patient does not have concerns regarding his medicines.  The following changes were made today:   START 200mg  amiodarone daily  Labs/ tests ordered today include:  Orders Placed This Encounter  Procedures   EKG 12-Lead     Disposition: Follow up with Dr. Graciela Williams   in 4-6 weeks , MD only     Signed, Sherie Don, NP  08/12/23  11:45 AM  Electrophysiology [facility]

## 2023-08-12 ENCOUNTER — Encounter: Payer: Self-pay | Admitting: Cardiology

## 2023-08-12 ENCOUNTER — Ambulatory Visit: Payer: Medicare Other | Attending: Cardiology | Admitting: Cardiology

## 2023-08-12 VITALS — BP 96/54 | HR 79 | Ht 71.0 in | Wt 246.4 lb

## 2023-08-12 DIAGNOSIS — Z95810 Presence of automatic (implantable) cardiac defibrillator: Secondary | ICD-10-CM

## 2023-08-12 DIAGNOSIS — Z951 Presence of aortocoronary bypass graft: Secondary | ICD-10-CM | POA: Diagnosis not present

## 2023-08-12 DIAGNOSIS — I4729 Other ventricular tachycardia: Secondary | ICD-10-CM | POA: Diagnosis not present

## 2023-08-12 DIAGNOSIS — I5022 Chronic systolic (congestive) heart failure: Secondary | ICD-10-CM | POA: Diagnosis not present

## 2023-08-12 DIAGNOSIS — I255 Ischemic cardiomyopathy: Secondary | ICD-10-CM | POA: Diagnosis not present

## 2023-08-12 LAB — CUP PACEART INCLINIC DEVICE CHECK
Battery Remaining Longevity: 18 mo
Brady Statistic RA Percent Paced: 4.6 %
Brady Statistic RV Percent Paced: 40 %
Date Time Interrogation Session: 20241125115939
HighPow Impedance: 41.2785
Implantable Lead Connection Status: 753985
Implantable Lead Connection Status: 753985
Implantable Lead Implant Date: 20160929
Implantable Lead Implant Date: 20160929
Implantable Lead Location: 753859
Implantable Lead Location: 753860
Implantable Lead Model: 7071
Implantable Pulse Generator Implant Date: 20160929
Lead Channel Impedance Value: 350 Ohm
Lead Channel Impedance Value: 400 Ohm
Lead Channel Pacing Threshold Amplitude: 1 V
Lead Channel Pacing Threshold Amplitude: 1 V
Lead Channel Pacing Threshold Amplitude: 1 V
Lead Channel Pacing Threshold Amplitude: 1 V
Lead Channel Pacing Threshold Pulse Width: 0.4 ms
Lead Channel Pacing Threshold Pulse Width: 0.4 ms
Lead Channel Pacing Threshold Pulse Width: 0.5 ms
Lead Channel Pacing Threshold Pulse Width: 0.5 ms
Lead Channel Sensing Intrinsic Amplitude: 10.7 mV
Lead Channel Sensing Intrinsic Amplitude: 4.6 mV
Lead Channel Setting Pacing Amplitude: 2 V
Lead Channel Setting Pacing Amplitude: 2.5 V
Lead Channel Setting Pacing Pulse Width: 0.5 ms
Lead Channel Setting Sensing Sensitivity: 0.5 mV
Pulse Gen Serial Number: 7306375
Zone Setting Status: 755011

## 2023-08-12 MED ORDER — AMIODARONE HCL 200 MG PO TABS: 200.0000 mg | ORAL_TABLET | Freq: Every day | ORAL | 5 refills | Status: DC

## 2023-08-12 NOTE — Patient Instructions (Signed)
Medication Instructions:  Your physician recommends the following medication changes.  START TAKING: Amiodarone 200 mg daily  *If you need a refill on your cardiac medications before your next appointment, please call your pharmacy*   Lab Work: None ordered at this time   Follow-Up: At [facility], you and your health needs are our priority.  As part of our continuing mission to provide you with exceptional heart care, we have created designated Provider Care Teams.  These Care Teams include your primary Cardiologist (physician) and Advanced Practice Providers (APPs -  Physician Assistants and Nurse Practitioners) who all work together to provide you with the care you need, when you need it.  We recommend signing up for the patient portal called "MyChart".  Sign up information is provided on this After Visit Summary.  MyChart is used to connect with patients for Virtual Visits (Telemedicine).  Patients are able to view lab/test results, encounter notes, upcoming appointments, etc.  Non-urgent messages can be sent to your provider as well.   To learn more about what you can do with MyChart, go to [email or web address].    Your next appointment:   1-2 month(s)  Provider:   Sherryl Manges, MD

## 2023-08-25 DIAGNOSIS — E118 Type 2 diabetes mellitus with unspecified complications: Secondary | ICD-10-CM | POA: Diagnosis not present

## 2023-08-26 DIAGNOSIS — G479 Sleep disorder, unspecified: Secondary | ICD-10-CM | POA: Diagnosis not present

## 2023-08-26 DIAGNOSIS — M79604 Pain in right leg: Secondary | ICD-10-CM | POA: Diagnosis not present

## 2023-08-26 DIAGNOSIS — G629 Polyneuropathy, unspecified: Secondary | ICD-10-CM | POA: Diagnosis not present

## 2023-08-26 DIAGNOSIS — R200 Anesthesia of skin: Secondary | ICD-10-CM | POA: Diagnosis not present

## 2023-08-27 DIAGNOSIS — F17200 Nicotine dependence, unspecified, uncomplicated: Secondary | ICD-10-CM | POA: Diagnosis not present

## 2023-08-27 DIAGNOSIS — I2510 Atherosclerotic heart disease of native coronary artery without angina pectoris: Secondary | ICD-10-CM | POA: Diagnosis not present

## 2023-08-27 DIAGNOSIS — M7660 Achilles tendinitis, unspecified leg: Secondary | ICD-10-CM | POA: Diagnosis not present

## 2023-08-27 DIAGNOSIS — I471 Supraventricular tachycardia, unspecified: Secondary | ICD-10-CM | POA: Diagnosis not present

## 2023-08-27 DIAGNOSIS — Z96651 Presence of right artificial knee joint: Secondary | ICD-10-CM | POA: Diagnosis not present

## 2023-08-27 DIAGNOSIS — I5022 Chronic systolic (congestive) heart failure: Secondary | ICD-10-CM | POA: Diagnosis not present

## 2023-08-27 DIAGNOSIS — Z794 Long term (current) use of insulin: Secondary | ICD-10-CM | POA: Diagnosis not present

## 2023-08-27 DIAGNOSIS — M1710 Unilateral primary osteoarthritis, unspecified knee: Secondary | ICD-10-CM | POA: Diagnosis not present

## 2023-08-27 DIAGNOSIS — Z951 Presence of aortocoronary bypass graft: Secondary | ICD-10-CM | POA: Diagnosis not present

## 2023-08-27 DIAGNOSIS — Z7985 Long-term (current) use of injectable non-insulin antidiabetic drugs: Secondary | ICD-10-CM | POA: Diagnosis not present

## 2023-08-27 DIAGNOSIS — Z6834 Body mass index (BMI) 34.0-34.9, adult: Secondary | ICD-10-CM | POA: Diagnosis not present

## 2023-08-27 DIAGNOSIS — I472 Ventricular tachycardia, unspecified: Secondary | ICD-10-CM | POA: Diagnosis not present

## 2023-08-27 DIAGNOSIS — G4733 Obstructive sleep apnea (adult) (pediatric): Secondary | ICD-10-CM | POA: Diagnosis not present

## 2023-08-27 DIAGNOSIS — E1141 Type 2 diabetes mellitus with diabetic mononeuropathy: Secondary | ICD-10-CM | POA: Diagnosis not present

## 2023-08-27 DIAGNOSIS — F419 Anxiety disorder, unspecified: Secondary | ICD-10-CM | POA: Diagnosis not present

## 2023-08-27 DIAGNOSIS — Z7982 Long term (current) use of aspirin: Secondary | ICD-10-CM | POA: Diagnosis not present

## 2023-08-27 DIAGNOSIS — E669 Obesity, unspecified: Secondary | ICD-10-CM | POA: Diagnosis not present

## 2023-08-27 DIAGNOSIS — Z95810 Presence of automatic (implantable) cardiac defibrillator: Secondary | ICD-10-CM | POA: Diagnosis not present

## 2023-08-27 DIAGNOSIS — E1142 Type 2 diabetes mellitus with diabetic polyneuropathy: Secondary | ICD-10-CM | POA: Diagnosis not present

## 2023-08-27 DIAGNOSIS — Z45018 Encounter for adjustment and management of other part of cardiac pacemaker: Secondary | ICD-10-CM | POA: Diagnosis not present

## 2023-08-27 DIAGNOSIS — M109 Gout, unspecified: Secondary | ICD-10-CM | POA: Diagnosis not present

## 2023-08-27 DIAGNOSIS — I110 Hypertensive heart disease with heart failure: Secondary | ICD-10-CM | POA: Diagnosis not present

## 2023-08-27 DIAGNOSIS — E785 Hyperlipidemia, unspecified: Secondary | ICD-10-CM | POA: Diagnosis not present

## 2023-08-27 DIAGNOSIS — I255 Ischemic cardiomyopathy: Secondary | ICD-10-CM | POA: Diagnosis not present

## 2023-08-27 DIAGNOSIS — I252 Old myocardial infarction: Secondary | ICD-10-CM | POA: Diagnosis not present

## 2023-08-27 DIAGNOSIS — Z7902 Long term (current) use of antithrombotics/antiplatelets: Secondary | ICD-10-CM | POA: Diagnosis not present

## 2023-08-27 DIAGNOSIS — Z79899 Other long term (current) drug therapy: Secondary | ICD-10-CM | POA: Diagnosis not present

## 2023-08-30 DIAGNOSIS — Z95811 Presence of heart assist device: Secondary | ICD-10-CM | POA: Diagnosis not present

## 2023-08-30 DIAGNOSIS — I5042 Chronic combined systolic (congestive) and diastolic (congestive) heart failure: Secondary | ICD-10-CM | POA: Diagnosis not present

## 2023-09-02 NOTE — Progress Notes (Signed)
Remote ICD transmission.   

## 2023-09-05 DIAGNOSIS — R9439 Abnormal result of other cardiovascular function study: Secondary | ICD-10-CM | POA: Diagnosis not present

## 2023-09-05 DIAGNOSIS — R079 Chest pain, unspecified: Secondary | ICD-10-CM | POA: Diagnosis not present

## 2023-09-05 DIAGNOSIS — I2510 Atherosclerotic heart disease of native coronary artery without angina pectoris: Secondary | ICD-10-CM | POA: Diagnosis not present

## 2023-09-05 DIAGNOSIS — I5022 Chronic systolic (congestive) heart failure: Secondary | ICD-10-CM | POA: Diagnosis not present

## 2023-09-09 DIAGNOSIS — Z7985 Long-term (current) use of injectable non-insulin antidiabetic drugs: Secondary | ICD-10-CM | POA: Diagnosis not present

## 2023-09-09 DIAGNOSIS — I5022 Chronic systolic (congestive) heart failure: Secondary | ICD-10-CM | POA: Diagnosis not present

## 2023-09-09 DIAGNOSIS — I255 Ischemic cardiomyopathy: Secondary | ICD-10-CM | POA: Diagnosis not present

## 2023-09-09 DIAGNOSIS — E119 Type 2 diabetes mellitus without complications: Secondary | ICD-10-CM | POA: Diagnosis not present

## 2023-09-09 DIAGNOSIS — I5042 Chronic combined systolic (congestive) and diastolic (congestive) heart failure: Secondary | ICD-10-CM | POA: Diagnosis not present

## 2023-09-09 DIAGNOSIS — Z6834 Body mass index (BMI) 34.0-34.9, adult: Secondary | ICD-10-CM | POA: Diagnosis not present

## 2023-09-09 DIAGNOSIS — Z95810 Presence of automatic (implantable) cardiac defibrillator: Secondary | ICD-10-CM | POA: Diagnosis not present

## 2023-09-09 DIAGNOSIS — Z45018 Encounter for adjustment and management of other part of cardiac pacemaker: Secondary | ICD-10-CM | POA: Diagnosis not present

## 2023-09-17 ENCOUNTER — Encounter: Payer: Medicare Other | Admitting: Internal Medicine

## 2023-09-24 ENCOUNTER — Encounter: Payer: Self-pay | Admitting: Primary Care

## 2023-09-24 DIAGNOSIS — E118 Type 2 diabetes mellitus with unspecified complications: Secondary | ICD-10-CM | POA: Diagnosis not present

## 2023-09-25 DIAGNOSIS — M1712 Unilateral primary osteoarthritis, left knee: Secondary | ICD-10-CM | POA: Diagnosis not present

## 2023-09-30 DIAGNOSIS — Z4502 Encounter for adjustment and management of automatic implantable cardiac defibrillator: Secondary | ICD-10-CM | POA: Diagnosis not present

## 2023-10-01 DIAGNOSIS — M25562 Pain in left knee: Secondary | ICD-10-CM | POA: Diagnosis not present

## 2023-10-01 DIAGNOSIS — E1149 Type 2 diabetes mellitus with other diabetic neurological complication: Secondary | ICD-10-CM | POA: Diagnosis not present

## 2023-10-01 DIAGNOSIS — G8929 Other chronic pain: Secondary | ICD-10-CM | POA: Diagnosis not present

## 2023-10-01 DIAGNOSIS — M1712 Unilateral primary osteoarthritis, left knee: Secondary | ICD-10-CM | POA: Diagnosis not present

## 2023-10-14 DIAGNOSIS — I472 Ventricular tachycardia, unspecified: Secondary | ICD-10-CM | POA: Diagnosis not present

## 2023-10-14 DIAGNOSIS — I2510 Atherosclerotic heart disease of native coronary artery without angina pectoris: Secondary | ICD-10-CM | POA: Diagnosis not present

## 2023-10-14 DIAGNOSIS — Z95810 Presence of automatic (implantable) cardiac defibrillator: Secondary | ICD-10-CM | POA: Diagnosis not present

## 2023-10-14 DIAGNOSIS — I255 Ischemic cardiomyopathy: Secondary | ICD-10-CM | POA: Diagnosis not present

## 2023-10-14 DIAGNOSIS — E782 Mixed hyperlipidemia: Secondary | ICD-10-CM | POA: Diagnosis not present

## 2023-10-14 DIAGNOSIS — Z4502 Encounter for adjustment and management of automatic implantable cardiac defibrillator: Secondary | ICD-10-CM | POA: Diagnosis not present

## 2023-10-14 DIAGNOSIS — I5022 Chronic systolic (congestive) heart failure: Secondary | ICD-10-CM | POA: Diagnosis not present

## 2023-10-14 DIAGNOSIS — G4733 Obstructive sleep apnea (adult) (pediatric): Secondary | ICD-10-CM | POA: Diagnosis not present

## 2023-10-14 DIAGNOSIS — I10 Essential (primary) hypertension: Secondary | ICD-10-CM | POA: Diagnosis not present

## 2023-10-15 DIAGNOSIS — M79604 Pain in right leg: Secondary | ICD-10-CM | POA: Diagnosis not present

## 2023-10-15 DIAGNOSIS — G479 Sleep disorder, unspecified: Secondary | ICD-10-CM | POA: Diagnosis not present

## 2023-10-15 DIAGNOSIS — G2581 Restless legs syndrome: Secondary | ICD-10-CM | POA: Diagnosis not present

## 2023-10-15 DIAGNOSIS — R200 Anesthesia of skin: Secondary | ICD-10-CM | POA: Diagnosis not present

## 2023-10-22 DIAGNOSIS — I2510 Atherosclerotic heart disease of native coronary artery without angina pectoris: Secondary | ICD-10-CM | POA: Diagnosis not present

## 2023-10-22 DIAGNOSIS — I255 Ischemic cardiomyopathy: Secondary | ICD-10-CM | POA: Diagnosis not present

## 2023-10-22 DIAGNOSIS — G4733 Obstructive sleep apnea (adult) (pediatric): Secondary | ICD-10-CM | POA: Diagnosis not present

## 2023-10-22 DIAGNOSIS — I25810 Atherosclerosis of coronary artery bypass graft(s) without angina pectoris: Secondary | ICD-10-CM | POA: Diagnosis not present

## 2023-10-22 DIAGNOSIS — F419 Anxiety disorder, unspecified: Secondary | ICD-10-CM | POA: Diagnosis not present

## 2023-10-22 DIAGNOSIS — I440 Atrioventricular block, first degree: Secondary | ICD-10-CM | POA: Diagnosis not present

## 2023-10-22 DIAGNOSIS — Z951 Presence of aortocoronary bypass graft: Secondary | ICD-10-CM | POA: Diagnosis not present

## 2023-10-22 DIAGNOSIS — Z9049 Acquired absence of other specified parts of digestive tract: Secondary | ICD-10-CM | POA: Diagnosis not present

## 2023-10-22 DIAGNOSIS — M170 Bilateral primary osteoarthritis of knee: Secondary | ICD-10-CM | POA: Diagnosis not present

## 2023-10-22 DIAGNOSIS — Z6832 Body mass index (BMI) 32.0-32.9, adult: Secondary | ICD-10-CM | POA: Diagnosis not present

## 2023-10-22 DIAGNOSIS — E669 Obesity, unspecified: Secondary | ICD-10-CM | POA: Diagnosis not present

## 2023-10-22 DIAGNOSIS — E118 Type 2 diabetes mellitus with unspecified complications: Secondary | ICD-10-CM | POA: Diagnosis not present

## 2023-10-22 DIAGNOSIS — Z95810 Presence of automatic (implantable) cardiac defibrillator: Secondary | ICD-10-CM | POA: Diagnosis not present

## 2023-10-22 DIAGNOSIS — I472 Ventricular tachycardia, unspecified: Secondary | ICD-10-CM | POA: Diagnosis not present

## 2023-10-22 DIAGNOSIS — I110 Hypertensive heart disease with heart failure: Secondary | ICD-10-CM | POA: Diagnosis not present

## 2023-10-22 DIAGNOSIS — Z955 Presence of coronary angioplasty implant and graft: Secondary | ICD-10-CM | POA: Diagnosis not present

## 2023-10-22 DIAGNOSIS — Z950 Presence of cardiac pacemaker: Secondary | ICD-10-CM | POA: Diagnosis not present

## 2023-10-22 DIAGNOSIS — R9431 Abnormal electrocardiogram [ECG] [EKG]: Secondary | ICD-10-CM | POA: Diagnosis not present

## 2023-10-22 DIAGNOSIS — Z7982 Long term (current) use of aspirin: Secondary | ICD-10-CM | POA: Diagnosis not present

## 2023-10-22 DIAGNOSIS — I081 Rheumatic disorders of both mitral and tricuspid valves: Secondary | ICD-10-CM | POA: Diagnosis not present

## 2023-10-22 DIAGNOSIS — I340 Nonrheumatic mitral (valve) insufficiency: Secondary | ICD-10-CM | POA: Diagnosis not present

## 2023-10-22 DIAGNOSIS — I442 Atrioventricular block, complete: Secondary | ICD-10-CM | POA: Diagnosis not present

## 2023-10-22 DIAGNOSIS — Z9641 Presence of insulin pump (external) (internal): Secondary | ICD-10-CM | POA: Diagnosis not present

## 2023-10-22 DIAGNOSIS — E785 Hyperlipidemia, unspecified: Secondary | ICD-10-CM | POA: Diagnosis not present

## 2023-10-22 DIAGNOSIS — Z4502 Encounter for adjustment and management of automatic implantable cardiac defibrillator: Secondary | ICD-10-CM | POA: Diagnosis not present

## 2023-10-22 DIAGNOSIS — I071 Rheumatic tricuspid insufficiency: Secondary | ICD-10-CM | POA: Diagnosis not present

## 2023-10-22 DIAGNOSIS — Z96651 Presence of right artificial knee joint: Secondary | ICD-10-CM | POA: Diagnosis not present

## 2023-10-22 DIAGNOSIS — Z794 Long term (current) use of insulin: Secondary | ICD-10-CM | POA: Diagnosis not present

## 2023-10-22 DIAGNOSIS — I2582 Chronic total occlusion of coronary artery: Secondary | ICD-10-CM | POA: Diagnosis not present

## 2023-10-22 DIAGNOSIS — I5022 Chronic systolic (congestive) heart failure: Secondary | ICD-10-CM | POA: Diagnosis not present

## 2023-10-22 DIAGNOSIS — E1140 Type 2 diabetes mellitus with diabetic neuropathy, unspecified: Secondary | ICD-10-CM | POA: Diagnosis not present

## 2023-10-23 DIAGNOSIS — G4733 Obstructive sleep apnea (adult) (pediatric): Secondary | ICD-10-CM | POA: Diagnosis not present

## 2023-10-23 DIAGNOSIS — I25810 Atherosclerosis of coronary artery bypass graft(s) without angina pectoris: Secondary | ICD-10-CM | POA: Diagnosis not present

## 2023-10-23 DIAGNOSIS — I2510 Atherosclerotic heart disease of native coronary artery without angina pectoris: Secondary | ICD-10-CM | POA: Diagnosis not present

## 2023-10-23 DIAGNOSIS — Z955 Presence of coronary angioplasty implant and graft: Secondary | ICD-10-CM | POA: Diagnosis not present

## 2023-10-23 DIAGNOSIS — Z96651 Presence of right artificial knee joint: Secondary | ICD-10-CM | POA: Diagnosis not present

## 2023-10-23 DIAGNOSIS — M170 Bilateral primary osteoarthritis of knee: Secondary | ICD-10-CM | POA: Diagnosis not present

## 2023-10-23 DIAGNOSIS — I472 Ventricular tachycardia, unspecified: Secondary | ICD-10-CM | POA: Diagnosis not present

## 2023-10-23 DIAGNOSIS — Z4502 Encounter for adjustment and management of automatic implantable cardiac defibrillator: Secondary | ICD-10-CM | POA: Diagnosis not present

## 2023-10-23 DIAGNOSIS — Z6832 Body mass index (BMI) 32.0-32.9, adult: Secondary | ICD-10-CM | POA: Diagnosis not present

## 2023-10-23 DIAGNOSIS — I2582 Chronic total occlusion of coronary artery: Secondary | ICD-10-CM | POA: Diagnosis not present

## 2023-10-23 DIAGNOSIS — E785 Hyperlipidemia, unspecified: Secondary | ICD-10-CM | POA: Diagnosis not present

## 2023-10-23 DIAGNOSIS — E669 Obesity, unspecified: Secondary | ICD-10-CM | POA: Diagnosis not present

## 2023-10-23 DIAGNOSIS — Z951 Presence of aortocoronary bypass graft: Secondary | ICD-10-CM | POA: Diagnosis not present

## 2023-10-23 DIAGNOSIS — Z9641 Presence of insulin pump (external) (internal): Secondary | ICD-10-CM | POA: Diagnosis not present

## 2023-10-23 DIAGNOSIS — Z9049 Acquired absence of other specified parts of digestive tract: Secondary | ICD-10-CM | POA: Diagnosis not present

## 2023-10-23 DIAGNOSIS — I081 Rheumatic disorders of both mitral and tricuspid valves: Secondary | ICD-10-CM | POA: Diagnosis not present

## 2023-10-23 DIAGNOSIS — Z95810 Presence of automatic (implantable) cardiac defibrillator: Secondary | ICD-10-CM | POA: Diagnosis not present

## 2023-10-23 DIAGNOSIS — I110 Hypertensive heart disease with heart failure: Secondary | ICD-10-CM | POA: Diagnosis not present

## 2023-10-23 DIAGNOSIS — Z7982 Long term (current) use of aspirin: Secondary | ICD-10-CM | POA: Diagnosis not present

## 2023-10-23 DIAGNOSIS — I440 Atrioventricular block, first degree: Secondary | ICD-10-CM | POA: Diagnosis not present

## 2023-10-23 DIAGNOSIS — I255 Ischemic cardiomyopathy: Secondary | ICD-10-CM | POA: Diagnosis not present

## 2023-10-23 DIAGNOSIS — E1140 Type 2 diabetes mellitus with diabetic neuropathy, unspecified: Secondary | ICD-10-CM | POA: Diagnosis not present

## 2023-10-23 DIAGNOSIS — F419 Anxiety disorder, unspecified: Secondary | ICD-10-CM | POA: Diagnosis not present

## 2023-10-23 DIAGNOSIS — I5022 Chronic systolic (congestive) heart failure: Secondary | ICD-10-CM | POA: Diagnosis not present

## 2023-10-23 LAB — LAB REPORT - SCANNED: EGFR: 90

## 2023-10-24 DIAGNOSIS — I255 Ischemic cardiomyopathy: Secondary | ICD-10-CM | POA: Diagnosis not present

## 2023-10-24 DIAGNOSIS — Z794 Long term (current) use of insulin: Secondary | ICD-10-CM | POA: Diagnosis not present

## 2023-10-24 DIAGNOSIS — E118 Type 2 diabetes mellitus with unspecified complications: Secondary | ICD-10-CM | POA: Diagnosis not present

## 2023-10-25 DIAGNOSIS — E118 Type 2 diabetes mellitus with unspecified complications: Secondary | ICD-10-CM | POA: Diagnosis not present

## 2023-10-28 DIAGNOSIS — M109 Gout, unspecified: Secondary | ICD-10-CM | POA: Diagnosis not present

## 2023-10-31 DIAGNOSIS — Z45010 Encounter for checking and testing of cardiac pacemaker pulse generator [battery]: Secondary | ICD-10-CM | POA: Diagnosis not present

## 2023-10-31 DIAGNOSIS — E118 Type 2 diabetes mellitus with unspecified complications: Secondary | ICD-10-CM | POA: Diagnosis not present

## 2023-10-31 DIAGNOSIS — Z794 Long term (current) use of insulin: Secondary | ICD-10-CM | POA: Diagnosis not present

## 2023-10-31 DIAGNOSIS — I472 Ventricular tachycardia, unspecified: Secondary | ICD-10-CM | POA: Diagnosis not present

## 2023-11-03 DIAGNOSIS — I472 Ventricular tachycardia, unspecified: Secondary | ICD-10-CM | POA: Diagnosis not present

## 2023-11-04 DIAGNOSIS — Z951 Presence of aortocoronary bypass graft: Secondary | ICD-10-CM | POA: Diagnosis not present

## 2023-11-04 DIAGNOSIS — I255 Ischemic cardiomyopathy: Secondary | ICD-10-CM | POA: Diagnosis not present

## 2023-11-04 DIAGNOSIS — E785 Hyperlipidemia, unspecified: Secondary | ICD-10-CM | POA: Diagnosis not present

## 2023-11-04 DIAGNOSIS — Z4502 Encounter for adjustment and management of automatic implantable cardiac defibrillator: Secondary | ICD-10-CM | POA: Diagnosis not present

## 2023-11-04 DIAGNOSIS — Z955 Presence of coronary angioplasty implant and graft: Secondary | ICD-10-CM | POA: Diagnosis not present

## 2023-11-04 DIAGNOSIS — M79642 Pain in left hand: Secondary | ICD-10-CM | POA: Diagnosis not present

## 2023-11-04 DIAGNOSIS — Z885 Allergy status to narcotic agent status: Secondary | ICD-10-CM | POA: Diagnosis not present

## 2023-11-04 DIAGNOSIS — R9431 Abnormal electrocardiogram [ECG] [EKG]: Secondary | ICD-10-CM | POA: Diagnosis not present

## 2023-11-04 DIAGNOSIS — I110 Hypertensive heart disease with heart failure: Secondary | ICD-10-CM | POA: Diagnosis not present

## 2023-11-04 DIAGNOSIS — M1991 Primary osteoarthritis, unspecified site: Secondary | ICD-10-CM | POA: Diagnosis not present

## 2023-11-04 DIAGNOSIS — Z7985 Long-term (current) use of injectable non-insulin antidiabetic drugs: Secondary | ICD-10-CM | POA: Diagnosis not present

## 2023-11-04 DIAGNOSIS — Z79899 Other long term (current) drug therapy: Secondary | ICD-10-CM | POA: Diagnosis not present

## 2023-11-04 DIAGNOSIS — Z794 Long term (current) use of insulin: Secondary | ICD-10-CM | POA: Diagnosis not present

## 2023-11-04 DIAGNOSIS — Z7902 Long term (current) use of antithrombotics/antiplatelets: Secondary | ICD-10-CM | POA: Diagnosis not present

## 2023-11-04 DIAGNOSIS — I5022 Chronic systolic (congestive) heart failure: Secondary | ICD-10-CM | POA: Diagnosis not present

## 2023-11-04 DIAGNOSIS — Z87891 Personal history of nicotine dependence: Secondary | ICD-10-CM | POA: Diagnosis not present

## 2023-11-04 DIAGNOSIS — Z7982 Long term (current) use of aspirin: Secondary | ICD-10-CM | POA: Diagnosis not present

## 2023-11-04 DIAGNOSIS — I472 Ventricular tachycardia, unspecified: Secondary | ICD-10-CM | POA: Diagnosis not present

## 2023-11-04 DIAGNOSIS — Z95810 Presence of automatic (implantable) cardiac defibrillator: Secondary | ICD-10-CM | POA: Diagnosis not present

## 2023-11-04 DIAGNOSIS — I493 Ventricular premature depolarization: Secondary | ICD-10-CM | POA: Diagnosis not present

## 2023-11-04 DIAGNOSIS — Z96651 Presence of right artificial knee joint: Secondary | ICD-10-CM | POA: Diagnosis not present

## 2023-11-04 DIAGNOSIS — E119 Type 2 diabetes mellitus without complications: Secondary | ICD-10-CM | POA: Diagnosis not present

## 2023-11-04 DIAGNOSIS — I2510 Atherosclerotic heart disease of native coronary artery without angina pectoris: Secondary | ICD-10-CM | POA: Diagnosis not present

## 2023-11-04 DIAGNOSIS — M109 Gout, unspecified: Secondary | ICD-10-CM | POA: Diagnosis not present

## 2023-11-05 DIAGNOSIS — M109 Gout, unspecified: Secondary | ICD-10-CM | POA: Diagnosis not present

## 2023-11-06 DIAGNOSIS — I2510 Atherosclerotic heart disease of native coronary artery without angina pectoris: Secondary | ICD-10-CM | POA: Diagnosis not present

## 2023-11-06 DIAGNOSIS — Z95810 Presence of automatic (implantable) cardiac defibrillator: Secondary | ICD-10-CM | POA: Diagnosis not present

## 2023-11-06 DIAGNOSIS — I5022 Chronic systolic (congestive) heart failure: Secondary | ICD-10-CM | POA: Diagnosis not present

## 2023-11-07 DIAGNOSIS — E118 Type 2 diabetes mellitus with unspecified complications: Secondary | ICD-10-CM | POA: Diagnosis not present

## 2023-11-07 DIAGNOSIS — Z794 Long term (current) use of insulin: Secondary | ICD-10-CM | POA: Diagnosis not present

## 2023-11-11 DIAGNOSIS — R9431 Abnormal electrocardiogram [ECG] [EKG]: Secondary | ICD-10-CM | POA: Diagnosis not present

## 2023-11-11 DIAGNOSIS — I472 Ventricular tachycardia, unspecified: Secondary | ICD-10-CM | POA: Diagnosis not present

## 2023-11-11 DIAGNOSIS — I5022 Chronic systolic (congestive) heart failure: Secondary | ICD-10-CM | POA: Diagnosis not present

## 2023-11-12 DIAGNOSIS — K080 Exfoliation of teeth due to systemic causes: Secondary | ICD-10-CM | POA: Diagnosis not present

## 2023-11-13 DIAGNOSIS — M109 Gout, unspecified: Secondary | ICD-10-CM | POA: Diagnosis not present

## 2023-11-13 DIAGNOSIS — I472 Ventricular tachycardia, unspecified: Secondary | ICD-10-CM | POA: Diagnosis not present

## 2023-11-13 DIAGNOSIS — I5022 Chronic systolic (congestive) heart failure: Secondary | ICD-10-CM | POA: Diagnosis not present

## 2023-11-14 DIAGNOSIS — Z794 Long term (current) use of insulin: Secondary | ICD-10-CM | POA: Diagnosis not present

## 2023-11-14 DIAGNOSIS — E118 Type 2 diabetes mellitus with unspecified complications: Secondary | ICD-10-CM | POA: Diagnosis not present

## 2023-11-21 DIAGNOSIS — E118 Type 2 diabetes mellitus with unspecified complications: Secondary | ICD-10-CM | POA: Diagnosis not present

## 2023-11-21 DIAGNOSIS — Z794 Long term (current) use of insulin: Secondary | ICD-10-CM | POA: Diagnosis not present

## 2023-11-28 DIAGNOSIS — Z794 Long term (current) use of insulin: Secondary | ICD-10-CM | POA: Diagnosis not present

## 2023-11-28 DIAGNOSIS — E118 Type 2 diabetes mellitus with unspecified complications: Secondary | ICD-10-CM | POA: Diagnosis not present

## 2023-12-04 DIAGNOSIS — G4733 Obstructive sleep apnea (adult) (pediatric): Secondary | ICD-10-CM | POA: Diagnosis not present

## 2023-12-04 DIAGNOSIS — E785 Hyperlipidemia, unspecified: Secondary | ICD-10-CM | POA: Diagnosis not present

## 2023-12-04 DIAGNOSIS — E119 Type 2 diabetes mellitus without complications: Secondary | ICD-10-CM | POA: Diagnosis not present

## 2023-12-04 DIAGNOSIS — Z6836 Body mass index (BMI) 36.0-36.9, adult: Secondary | ICD-10-CM | POA: Diagnosis not present

## 2023-12-04 DIAGNOSIS — Z7985 Long-term (current) use of injectable non-insulin antidiabetic drugs: Secondary | ICD-10-CM | POA: Diagnosis not present

## 2023-12-04 DIAGNOSIS — Z7952 Long term (current) use of systemic steroids: Secondary | ICD-10-CM | POA: Diagnosis not present

## 2023-12-04 DIAGNOSIS — Z885 Allergy status to narcotic agent status: Secondary | ICD-10-CM | POA: Diagnosis not present

## 2023-12-04 DIAGNOSIS — Z7982 Long term (current) use of aspirin: Secondary | ICD-10-CM | POA: Diagnosis not present

## 2023-12-04 DIAGNOSIS — I255 Ischemic cardiomyopathy: Secondary | ICD-10-CM | POA: Diagnosis not present

## 2023-12-04 DIAGNOSIS — Z794 Long term (current) use of insulin: Secondary | ICD-10-CM | POA: Diagnosis not present

## 2023-12-04 DIAGNOSIS — I110 Hypertensive heart disease with heart failure: Secondary | ICD-10-CM | POA: Diagnosis not present

## 2023-12-04 DIAGNOSIS — I5022 Chronic systolic (congestive) heart failure: Secondary | ICD-10-CM | POA: Diagnosis not present

## 2023-12-04 DIAGNOSIS — Z951 Presence of aortocoronary bypass graft: Secondary | ICD-10-CM | POA: Diagnosis not present

## 2023-12-04 DIAGNOSIS — Z87891 Personal history of nicotine dependence: Secondary | ICD-10-CM | POA: Diagnosis not present

## 2023-12-04 DIAGNOSIS — M79642 Pain in left hand: Secondary | ICD-10-CM | POA: Diagnosis not present

## 2023-12-04 DIAGNOSIS — Z7902 Long term (current) use of antithrombotics/antiplatelets: Secondary | ICD-10-CM | POA: Diagnosis not present

## 2023-12-04 DIAGNOSIS — E669 Obesity, unspecified: Secondary | ICD-10-CM | POA: Diagnosis not present

## 2023-12-04 DIAGNOSIS — G4730 Sleep apnea, unspecified: Secondary | ICD-10-CM | POA: Diagnosis not present

## 2023-12-04 DIAGNOSIS — I472 Ventricular tachycardia, unspecified: Secondary | ICD-10-CM | POA: Diagnosis not present

## 2023-12-04 DIAGNOSIS — I2510 Atherosclerotic heart disease of native coronary artery without angina pectoris: Secondary | ICD-10-CM | POA: Diagnosis not present

## 2023-12-05 DIAGNOSIS — Z885 Allergy status to narcotic agent status: Secondary | ICD-10-CM | POA: Diagnosis not present

## 2023-12-05 DIAGNOSIS — Z7902 Long term (current) use of antithrombotics/antiplatelets: Secondary | ICD-10-CM | POA: Diagnosis not present

## 2023-12-05 DIAGNOSIS — M79642 Pain in left hand: Secondary | ICD-10-CM | POA: Diagnosis not present

## 2023-12-05 DIAGNOSIS — E785 Hyperlipidemia, unspecified: Secondary | ICD-10-CM | POA: Diagnosis not present

## 2023-12-05 DIAGNOSIS — I2510 Atherosclerotic heart disease of native coronary artery without angina pectoris: Secondary | ICD-10-CM | POA: Diagnosis not present

## 2023-12-05 DIAGNOSIS — G4730 Sleep apnea, unspecified: Secondary | ICD-10-CM | POA: Diagnosis not present

## 2023-12-05 DIAGNOSIS — I110 Hypertensive heart disease with heart failure: Secondary | ICD-10-CM | POA: Diagnosis not present

## 2023-12-05 DIAGNOSIS — Z7985 Long-term (current) use of injectable non-insulin antidiabetic drugs: Secondary | ICD-10-CM | POA: Diagnosis not present

## 2023-12-05 DIAGNOSIS — E669 Obesity, unspecified: Secondary | ICD-10-CM | POA: Diagnosis not present

## 2023-12-05 DIAGNOSIS — Z794 Long term (current) use of insulin: Secondary | ICD-10-CM | POA: Diagnosis not present

## 2023-12-05 DIAGNOSIS — E119 Type 2 diabetes mellitus without complications: Secondary | ICD-10-CM | POA: Diagnosis not present

## 2023-12-05 DIAGNOSIS — Z6836 Body mass index (BMI) 36.0-36.9, adult: Secondary | ICD-10-CM | POA: Diagnosis not present

## 2023-12-05 DIAGNOSIS — I472 Ventricular tachycardia, unspecified: Secondary | ICD-10-CM | POA: Diagnosis not present

## 2023-12-05 DIAGNOSIS — I5022 Chronic systolic (congestive) heart failure: Secondary | ICD-10-CM | POA: Diagnosis not present

## 2023-12-05 DIAGNOSIS — Z951 Presence of aortocoronary bypass graft: Secondary | ICD-10-CM | POA: Diagnosis not present

## 2023-12-05 DIAGNOSIS — I255 Ischemic cardiomyopathy: Secondary | ICD-10-CM | POA: Diagnosis not present

## 2023-12-05 DIAGNOSIS — Z7982 Long term (current) use of aspirin: Secondary | ICD-10-CM | POA: Diagnosis not present

## 2023-12-05 DIAGNOSIS — E118 Type 2 diabetes mellitus with unspecified complications: Secondary | ICD-10-CM | POA: Diagnosis not present

## 2023-12-05 DIAGNOSIS — Z87891 Personal history of nicotine dependence: Secondary | ICD-10-CM | POA: Diagnosis not present

## 2023-12-05 DIAGNOSIS — Z7952 Long term (current) use of systemic steroids: Secondary | ICD-10-CM | POA: Diagnosis not present

## 2023-12-05 DIAGNOSIS — G4733 Obstructive sleep apnea (adult) (pediatric): Secondary | ICD-10-CM | POA: Diagnosis not present

## 2023-12-07 DIAGNOSIS — I472 Ventricular tachycardia, unspecified: Secondary | ICD-10-CM | POA: Diagnosis not present

## 2023-12-07 DIAGNOSIS — I255 Ischemic cardiomyopathy: Secondary | ICD-10-CM | POA: Diagnosis not present

## 2023-12-09 DIAGNOSIS — Z95810 Presence of automatic (implantable) cardiac defibrillator: Secondary | ICD-10-CM | POA: Diagnosis not present

## 2023-12-09 DIAGNOSIS — I5042 Chronic combined systolic (congestive) and diastolic (congestive) heart failure: Secondary | ICD-10-CM | POA: Diagnosis not present

## 2023-12-09 DIAGNOSIS — Z4502 Encounter for adjustment and management of automatic implantable cardiac defibrillator: Secondary | ICD-10-CM | POA: Diagnosis not present

## 2023-12-12 DIAGNOSIS — E118 Type 2 diabetes mellitus with unspecified complications: Secondary | ICD-10-CM | POA: Diagnosis not present

## 2023-12-12 DIAGNOSIS — Z794 Long term (current) use of insulin: Secondary | ICD-10-CM | POA: Diagnosis not present

## 2023-12-19 DIAGNOSIS — K080 Exfoliation of teeth due to systemic causes: Secondary | ICD-10-CM | POA: Diagnosis not present

## 2023-12-19 DIAGNOSIS — E118 Type 2 diabetes mellitus with unspecified complications: Secondary | ICD-10-CM | POA: Diagnosis not present

## 2023-12-19 DIAGNOSIS — Z794 Long term (current) use of insulin: Secondary | ICD-10-CM | POA: Diagnosis not present

## 2023-12-21 DIAGNOSIS — E118 Type 2 diabetes mellitus with unspecified complications: Secondary | ICD-10-CM | POA: Diagnosis not present

## 2023-12-21 DIAGNOSIS — Z794 Long term (current) use of insulin: Secondary | ICD-10-CM | POA: Diagnosis not present

## 2023-12-26 ENCOUNTER — Ambulatory Visit

## 2023-12-26 VITALS — Ht 71.0 in | Wt 232.0 lb

## 2023-12-26 DIAGNOSIS — E118 Type 2 diabetes mellitus with unspecified complications: Secondary | ICD-10-CM | POA: Diagnosis not present

## 2023-12-26 DIAGNOSIS — Z0000 Encounter for general adult medical examination without abnormal findings: Secondary | ICD-10-CM

## 2023-12-26 DIAGNOSIS — Z794 Long term (current) use of insulin: Secondary | ICD-10-CM | POA: Diagnosis not present

## 2023-12-26 NOTE — Patient Instructions (Signed)
 [name] , Thank you for taking time to come for your Medicare Wellness Visit. I appreciate your ongoing commitment to your health goals. Please review the following plan we discussed and let me know if I can assist you in the future.   Referrals/Orders/Follow-Ups/Clinician Recommendations: none  This is a list of the screening recommended for you and due dates:  Health Maintenance  Topic Date Due   Pneumococcal Vaccination (2 of 2 - PCV) 07/22/2019   Complete foot exam   02/03/2021   Eye exam for diabetics  03/29/2023   COVID-19 Vaccine (5 - 2024-25 season) 05/19/2023   Screening for Lung Cancer  08/24/2023   Yearly kidney health urinalysis for diabetes  09/08/2023   Hemoglobin A1C  10/13/2023   Flu Shot  04/17/2024   Yearly kidney function blood test for diabetes  10/22/2024   Medicare Annual Wellness Visit  12/25/2024   Colon Cancer Screening  10/06/2028   DTaP/Tdap/Td vaccine (4 - Td or Tdap) 08/02/2031   HIV Screening  Completed   Zoster (Shingles) Vaccine  Completed   Hepatitis C Screening  Addressed   HPV Vaccine  Aged Out   Meningitis B Vaccine  Aged Out    Advanced directives: (Copy Requested) Please bring a copy of your health care power of attorney and living will to the office to be added to your chart at your convenience. You can mail to [facility] [address] [place], [place] [address] or email to [email or web address]  Next Medicare Annual Wellness Visit scheduled for next year: Yes 12/28/24 @ 1:40pm televisit

## 2023-12-26 NOTE — Progress Notes (Signed)
 Subjective:   Brian Williams is a 63 y.o. who presents for a Medicare Wellness preventive visit.  Visit Complete: Virtual I connected with  Brian Williams on 12/26/23 by a audio enabled telemedicine application and verified that I am speaking with the correct person using two identifiers.  Patient Location: Home  Provider Location: Home Office  I discussed the limitations of evaluation and management by telemedicine. The patient expressed understanding and agreed to proceed.  Vital Signs: Because this visit was a virtual/telehealth visit, some criteria may be missing or patient reported. Any vitals not documented were not able to be obtained and vitals that have been documented are patient reported.  VideoDeclined- This patient declined Librarian, academic. Therefore the visit was completed with audio only.  Persons Participating in Visit: Patient.  AWV Questionnaire: No: Patient Medicare AWV questionnaire was not completed prior to this visit.  Cardiac Risk Factors include: advanced age (>42men, >68 women);diabetes mellitus;dyslipidemia;hypertension;obesity (BMI >30kg/m2);male gender;sedentary lifestyle     Objective:    Today's Vitals   12/26/23 1303 12/26/23 1304  Weight: 232 lb (105.2 kg)   Height: 5\' 11"  (1.803 m)   PainSc:  3    Body mass index is 32.36 kg/m.     12/26/2023    1:15 PM 10/30/2022   10:11 AM 07/09/2022   10:57 AM 12/27/2021   10:56 AM 10/06/2021    9:42 AM 09/06/2021    9:26 AM 08/28/2021    9:32 AM  Advanced Directives  Does Patient Have a Medical Advance Directive? Yes Yes Yes Yes Yes Yes Yes  Type of Estate agent of [place];Living will Healthcare Power of [place];Living will  Healthcare Power of [place];Living will Living will Living will;Healthcare Power of Attorney   Does patient want to make changes to medical advance directive?      No - Patient declined No - Patient declined  Copy of  Healthcare Power of Attorney in Chart? No - copy requested Yes - validated most recent copy scanned in chart (See row information)    No - copy requested     Current Medications (verified) Outpatient Encounter Medications as of 12/26/2023  Medication Sig   amiodarone (PACERONE) 200 MG tablet Take 1 tablet (200 mg total) by mouth daily.   aspirin EC 81 MG tablet Take 81 mg by mouth daily.    clopidogrel (PLAVIX) 75 MG tablet Take 1 tablet (75 mg total) by mouth daily.   ezetimibe (ZETIA) 10 MG tablet Take 1 tablet (10 mg total) by mouth daily. For cholesterol.   furosemide (LASIX) 40 MG tablet Take 80 mg by mouth daily.   gemfibrozil (LOPID) 600 MG tablet TAKE 1 TABLET BY MOUTH DAILY FOR CHOLESTEROL   insulin regular human CONCENTRATED (HUMULIN R) 500 UNIT/ML injection Inject 0-25 Units into the skin daily at 12 noon.   metoprolol succinate (TOPROL-XL) 100 MG 24 hr tablet Take 200 mg by mouth daily.   nitroGLYCERIN (NITROSTAT) 0.4 MG SL tablet Place 1 tablet (0.4 mg total) under the tongue every 5 (five) minutes as needed for chest pain.   omega-3 acid ethyl esters (LOVAZA) 1 g capsule Take 1 g by mouth 2 (two) times daily.   pregabalin (LYRICA) 25 MG capsule Take 75 mg by mouth daily.   rosuvastatin (CRESTOR) 40 MG tablet Take 1 tablet (40 mg total) by mouth daily.   sacubitril-valsartan (ENTRESTO) 49-51 MG Take 1 tablet by mouth 2 (two) times daily.   Semaglutide, 2 MG/DOSE, (OZEMPIC,  2 MG/DOSE,) 8 MG/3ML SOPN Inject 2 mg into the skin every Friday.   spironolactone (ALDACTONE) 25 MG tablet Take 25 mg by mouth daily.   No facility-administered encounter medications on file as of 12/26/2023.    Allergies (verified) Naproxen and Morphine and codeine   History: Past Medical History:  Diagnosis Date   Acute appendicitis 04/03/2021   Acute pyelonephritis 09/07/2021   AICD (automatic cardioverter/defibrillator) present    Allergy    Anxiety    takes Xanax as needed   Appendicitis     Arthritis    back,knees,right shoulder   Back pain    Bacteremia due to Klebsiella pneumoniae 09/07/2021   Cardiomyopathy (HCC)    CHF (congestive heart failure) (HCC)    takes Lasix and Aldactone daily   Coronary artery disease    takes Plavix daily   Depression    takes Zoloft daily   Diabetes mellitus without complication (HCC)    Humulin R and Farxiga daily.Fasting blood sugar runs 140   GERD (gastroesophageal reflux disease)    Headache    History of bronchitis    History of colon polyps    benign   History of hiatal hernia    Hyperlipidemia    takes Fenofibrate,Crestor, and Zetia daily   Hypertension    takes Entresto and Coreg daily   MI (myocardial infarction) (HCC) 2001   Multifocal pneumonia 09/07/2021   Obesity    Peripheral neuropathy    Persistent cough for 3 weeks or longer 07/19/2022   Pneumonia    history of-last time about 14 yrs ago   PONV (postoperative nausea and vomiting)    after knee surgery 25 yrs ago b/p stayed elevated for a while   Presence of permanent cardiac pacemaker    Sepsis (HCC) 09/06/2021   Shortness of breath dyspnea    with exertion   Sleep apnea    uses CPAP nightly   Ventricular tachycardia (HCC)    s/p RFCA PVCs 2013   Past Surgical History:  Procedure Laterality Date   APPENDECTOMY     BACK SURGERY     CARDIAC CATHETERIZATION     CARDIAC CATHETERIZATION Left 05/10/2016   Procedure: Left Heart Cath and Coronary Angiography;  Surgeon: Antonieta Iba, MD;  Location: [facility] INVASIVE CV LAB;  Service: Cardiovascular;  Laterality: Left;   CARDIAC DEFIBRILLATOR PLACEMENT  10/16/2007   ICD Model number 2207-36 serial number [id number]   CARDIAC ELECTROPHYSIOLOGY STUDY AND ABLATION     CHOLECYSTECTOMY  2001   COLONOSCOPY     COLONOSCOPY WITH PROPOFOL N/A 08/28/2018   Procedure: COLONOSCOPY WITH PROPOFOL;  Surgeon: Wyline Mood, MD;  Location[facility] ENDOSCOPY;  Service: Gastroenterology;  Laterality: N/A;   COLONOSCOPY WITH PROPOFOL N/A  10/06/2021   Procedure: COLONOSCOPY WITH PROPOFOL;  Surgeon: Wyline Mood, MD;  Location: [facility] ENDOSCOPY;  Service: Gastroenterology;  Laterality: N/A;   CORONARY ANGIOPLASTY WITH STENT PLACEMENT     7 stents   CORONARY ARTERY BYPASS GRAFT N/A 05/24/2016   Procedure: CORONARY ARTERY BYPASS GRAFTING (CABG) x four , using left internal mammary artery and left leg greater saphenous vein harvested endoscopically;  Surgeon: Alleen Borne, MD;  Location: [facility] OR;  Service: Open Heart Surgery;  Laterality: N/A;   EP IMPLANTABLE DEVICE N/A 06/16/2015   Procedure: ICD Generator Changeout;  Surgeon: Duke Salvia, MD;  Location: [facility] INVASIVE CV LAB;  Service: Cardiovascular;  Laterality: N/A;   ESOPHAGOGASTRODUODENOSCOPY     EYE SURGERY  Cataract 2019   INSERT /  REPLACE / REMOVE PACEMAKER     JOINT REPLACEMENT  2024   KNEE SURGERY     bilateral knee    SPINE SURGERY  1991   [name] WITHOUT CARDIOVERSION N/A 05/24/2016   Procedure: TRANSESOPHAGEAL ECHOCARDIOGRAM ([name]);  Surgeon: Alleen Borne, MD;  Location: [facility];  Service: Open Heart Surgery;  Laterality: N/A;   Ulnar Nerve surgery Left 09/19/2022   VASECTOMY     Family History  Problem Relation Age of Onset   Heart attack Mother 31   Hypertension Mother    Melanoma Mother    Arthritis Mother    Heart attack Father 26   Hypertension Father    Hypertension Maternal Uncle    Heart disease Maternal Uncle    Heart disease Maternal Grandmother    Stroke Maternal Grandmother    Diabetes Neg Hx    Social History   Socioeconomic History   Marital status: Married    Spouse name: Barrington Worley   Number of children: 2   Years of education: Not on file   Highest education level: Not on file  Occupational History   Not on file  Tobacco Use   Smoking status: Former    Current packs/day: 0.00    Average packs/day: 1 pack/day for 25.0 years (25.0 ttl pk-yrs)    Types: Cigarettes    Start date: 05/19/1993    Quit date: 05/19/2018    Years since  quitting: 5.6   Smokeless tobacco: Never  Vaping Use   Vaping status: Never Used  Substance and Sexual Activity   Alcohol use: No    Comment: rare   Drug use: Never   Sexual activity: Not Currently    Birth control/protection: None  Other Topics Concern   Not on file  Social History Narrative   Married.   Moved from [place].   Disabled.   Social Drivers of Corporate investment banker Strain: Low Risk  (12/26/2023)   Overall Financial Resource Strain (CARDIA)    Difficulty of Paying Living Expenses: Not hard at all  Food Insecurity: No Food Insecurity (12/26/2023)   Hunger Vital Sign    Worried About Running Out of Food in the Last Year: Never true    Ran Out of Food in the Last Year: Never true  Transportation Needs: No Transportation Needs (12/26/2023)   PRAPARE - Administrator, Civil Service (Medical): No    Lack of Transportation (Non-Medical): No  Physical Activity: Inactive (12/26/2023)   Exercise Vital Sign    Days of Exercise per Week: 0 days    Minutes of Exercise per Session: 0 min  Stress: No Stress Concern Present (12/26/2023)   [employer] of Occupational Health - Occupational Stress Questionnaire    Feeling of Stress : Not at all  Social Connections: Socially Isolated (12/26/2023)   Social Connection and Isolation Panel [NHANES]    Frequency of Communication with Friends and Family: Never    Frequency of Social Gatherings with Friends and Family: Once a week    Attends Religious Services: Never    Database administrator or Organizations: No    Attends Banker Meetings: Never    Marital Status: Married    Tobacco Counseling Counseling given: Not Answered    Clinical Intake:  Pre-visit preparation completed: Yes  Pain : 0-10 (tooth pulled 12/19/23) Pain Score: 3  Pain Type: Acute pain Pain Location: Teeth Pain Descriptors / Indicators: Aching Pain Onset: In the past 7 days Pain  Frequency: Intermittent Pain Relieving  Factors: tylenol  Pain Relieving Factors: tylenol  BMI - recorded: 32.36 Nutritional Status: BMI > 30  Obese Nutritional Risks: None Diabetes: Yes CBG done?: Yes (BS 150 now at home) CBG resulted in Enter/ Edit results?: No Did pt. 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 0  Vision? 0  Difficulty concentrating or making decisions? 0  Walking or climbing stairs? 0  Dressing or bathing? 0  Doing errands, shopping? 0  Preparing Food and eating ? N  Using the Toilet? N  In the past six months, have you accidently leaked urine? N  Do you have problems with loss of bowel control? N  Managing your Medications? N  Managing your Finances? N  Housekeeping or managing your Housekeeping? N    Patient Care Team: Doreene Nest, NP as PCP - General (Internal Medicine) Antonieta Iba, MD as PCP - Cardiology (Cardiology) Duke Salvia, MD as PCP - Electrophysiology (Cardiology) Antonieta Iba, MD as Consulting Physician (Cardiology) Cherly Hensen Katherina Right, MD as Consulting Physician (Cardiology) Abbie Sons, MD as Referring Physician (Dermatology) Carrie Mew, OD (Optometry)  Indicate any recent Medical Services you may have received from other than [facility] providers in the past year (date may be approximate).     Assessment:   This is a routine wellness examination for Ahmani.  Hearing/Vision screen Hearing Screening - Comments:: Pt says  his hearing is good Vision Screening - Comments:: Pt says his vision is good with glasses Dr Lynnae Prude   Goals Addressed             This Visit's Progress    COMPLETED: Patient Stated   On track    Would like to decrease weight to 235.       Depression Screen     12/26/2023    1:11 PM 12/18/2022   10:12 AM 11/14/2022    9:22 AM 10/30/2022   10:02 AM 10/23/2022   10:43 AM 07/09/2022   10:57 AM 12/27/2021   10:56 AM  PHQ 2/9 Scores  PHQ - 2 Score 0 3 5 5 5  0 0  PHQ- 9 Score  10 17 12 14       Fall Risk     12/26/2023    1:07 PM 12/18/2022   10:12 AM 11/14/2022    9:22 AM 10/30/2022    9:58 AM 10/23/2022   10:43 AM  Fall Risk   Falls in the past year? 0 0 0 1 1  Number falls in past yr: 0 0  0 0  Injury with Fall? 0 0  0 0  Risk for fall due to : No Fall Risks No Fall Risks  No Fall Risks No Fall Risks  Follow up Education provided;Falls prevention discussed Falls evaluation completed  Education provided;Falls prevention discussed Falls evaluation completed    MEDICARE RISK AT HOME:  Medicare  Risk at Home Any stairs in or around the home?: No If so, are there any without handrails?: No Home free of loose throw rugs in walkways, pet beds, electrical cords, etc?: Yes Adequate lighting in your home to reduce risk of falls?: Yes Life alert?: No Use of a cane, walker or w/c?: No Grab bars in the bathroom?: Yes Shower chair or bench in shower?: Yes Elevated toilet seat or a handicapped toilet?: Yes  TIMED UP AND GO:  Was the test performed?  No  Cognitive Function: 6CIT completed      01/06/2016   10:59 AM  Montreal Cognitive Assessment   Visuospatial/ Executive (0/5) 4  Naming (0/3) 3  Attention: Read list of digits (0/2) 2  Attention: Read list of letters (0/1) 1  Attention: Serial 7 subtraction starting at 100 (0/3) 3  Language: Repeat phrase (0/2) 2  Language : Fluency (0/1) 1  Abstraction (0/2) 2  Delayed Recall (0/5) 4  Orientation (0/6) 6  Total 28   Adjusted Score (based on education) 28      12/26/2023    1:53 PM 10/30/2022   10:12 AM  6CIT Screen  What Year? 0 points 0 points  What month? 0 points 0 points  What time? 0 points 0 points  Count back from 20 0 points 0 points  Months in reverse 0 points 0 points  Repeat phrase 0 points 2 points  Total Score 0 points 2 points    Immunizations Immunization History  Administered Date(s) Administered   Influenza Inj Mdck Quad Pf 06/25/2018, 06/05/2019, 06/23/2021, 07/23/2022   Influenza Split 07/22/2015   Influenza,inj,Quad PF,6+ Mos 06/13/2016, 06/24/2020   Influenza-Unspecified 06/25/2018, 06/06/2019, 06/21/2023   Moderna Covid-19 Vaccine Bivalent Booster 82yrs & up 06/23/2021   Moderna Sars-Covid-2 Vaccination 10/31/2019, 11/21/2019, 07/12/2020   Pfizer Covid-19 Vaccine Bivalent Booster 3yrs & up 07/06/2021   Pneumococcal Polysaccharide-23 07/21/2018   Td 04/17/2018   Tdap 02/22/2018, 08/01/2021   Zoster Recombinant(Shingrix) 08/13/2020, 10/16/2020    Screening Tests Health Maintenance  Topic Date Due   Pneumococcal Vaccine 58-31 Years old (2 of 2 - PCV) 07/22/2019   FOOT EXAM  02/03/2021   OPHTHALMOLOGY EXAM  03/29/2023   COVID-19 Vaccine (5 - 2024-25 season) 05/19/2023   Lung Cancer Screening  08/24/2023   Diabetic kidney evaluation - Urine ACR  09/08/2023   HEMOGLOBIN A1C  10/13/2023   INFLUENZA VACCINE  04/17/2024   Diabetic kidney evaluation - eGFR measurement  10/22/2024   Medicare Annual Wellness (AWV)  12/25/2024   Colonoscopy  10/06/2028   DTaP/Tdap/Td (4 - Td or Tdap) 08/02/2031   HIV Screening  Completed   Zoster Vaccines- Shingrix  Completed   Hepatitis C Screening  Addressed   HPV VACCINES  Aged Out   Meningococcal B Vaccine  Aged Out    Health Maintenance  Health Maintenance Due  Topic Date Due   Pneumococcal Vaccine 110-75 Years old (2 of 2 - PCV) 07/22/2019   FOOT EXAM  02/03/2021   OPHTHALMOLOGY EXAM  03/29/2023   COVID-19 Vaccine (5 -  2024-25 season) 05/19/2023   Lung Cancer Screening  08/24/2023   Diabetic kidney evaluation - Urine ACR  09/08/2023   HEMOGLOBIN A1C  10/13/2023   Health Maintenance Items Addressed: Pt declines LCS at this time  Additional Screening:  Vision Screening: Recommended annual ophthalmology exams for early detection of glaucoma and other disorders of the eye.  Dental Screening: Recommended annual dental exams for proper oral hygiene  Community Resource Referral /  Chronic Care Management: CRR required this visit?  No   CCM required this visit?  No     Plan:     I have personally reviewed and noted the following in the patient's chart:   Medical and social history Use of alcohol, tobacco or illicit drugs  Current medications and supplements including opioid prescriptions. Patient is not currently taking opioid prescriptions. Functional ability and status Nutritional status Physical activity Advanced directives List of other physicians Hospitalizations, surgeries, and ER visits in previous 12 months Vitals Screenings to include cognitive, depression, and falls Referrals and appointments  In addition, I have reviewed and discussed with patient certain preventive protocols, quality metrics, and best practice recommendations. A written personalized care plan for preventive services as well as general preventive health recommendations were provided to patient.     Sue Lush, LPN   12/24/8117   After Visit Summary: (MyChart) Due to this being a telephonic visit, the after visit summary with patients personalized plan was offered to patient via MyChart   Notes: Nothing significant to report at this time.

## 2023-12-27 DIAGNOSIS — E785 Hyperlipidemia, unspecified: Secondary | ICD-10-CM | POA: Diagnosis not present

## 2023-12-27 DIAGNOSIS — Z7982 Long term (current) use of aspirin: Secondary | ICD-10-CM | POA: Diagnosis not present

## 2023-12-27 DIAGNOSIS — I472 Ventricular tachycardia, unspecified: Secondary | ICD-10-CM | POA: Diagnosis not present

## 2023-12-27 DIAGNOSIS — I2510 Atherosclerotic heart disease of native coronary artery without angina pectoris: Secondary | ICD-10-CM | POA: Diagnosis not present

## 2023-12-27 DIAGNOSIS — M109 Gout, unspecified: Secondary | ICD-10-CM | POA: Diagnosis not present

## 2023-12-27 DIAGNOSIS — Z8679 Personal history of other diseases of the circulatory system: Secondary | ICD-10-CM | POA: Diagnosis not present

## 2023-12-27 DIAGNOSIS — Z951 Presence of aortocoronary bypass graft: Secondary | ICD-10-CM | POA: Diagnosis not present

## 2023-12-27 DIAGNOSIS — E669 Obesity, unspecified: Secondary | ICD-10-CM | POA: Diagnosis not present

## 2023-12-27 DIAGNOSIS — I5022 Chronic systolic (congestive) heart failure: Secondary | ICD-10-CM | POA: Diagnosis not present

## 2023-12-27 DIAGNOSIS — Z9989 Dependence on other enabling machines and devices: Secondary | ICD-10-CM | POA: Diagnosis not present

## 2023-12-27 DIAGNOSIS — Z794 Long term (current) use of insulin: Secondary | ICD-10-CM | POA: Diagnosis not present

## 2023-12-27 DIAGNOSIS — I110 Hypertensive heart disease with heart failure: Secondary | ICD-10-CM | POA: Diagnosis not present

## 2023-12-27 DIAGNOSIS — E1142 Type 2 diabetes mellitus with diabetic polyneuropathy: Secondary | ICD-10-CM | POA: Diagnosis not present

## 2023-12-27 DIAGNOSIS — Z79899 Other long term (current) drug therapy: Secondary | ICD-10-CM | POA: Diagnosis not present

## 2023-12-27 DIAGNOSIS — G4733 Obstructive sleep apnea (adult) (pediatric): Secondary | ICD-10-CM | POA: Diagnosis not present

## 2023-12-27 DIAGNOSIS — Z96651 Presence of right artificial knee joint: Secondary | ICD-10-CM | POA: Diagnosis not present

## 2023-12-27 DIAGNOSIS — D649 Anemia, unspecified: Secondary | ICD-10-CM | POA: Diagnosis not present

## 2023-12-27 DIAGNOSIS — M1710 Unilateral primary osteoarthritis, unspecified knee: Secondary | ICD-10-CM | POA: Diagnosis not present

## 2023-12-27 DIAGNOSIS — Z7901 Long term (current) use of anticoagulants: Secondary | ICD-10-CM | POA: Diagnosis not present

## 2023-12-27 DIAGNOSIS — Z87891 Personal history of nicotine dependence: Secondary | ICD-10-CM | POA: Diagnosis not present

## 2023-12-27 DIAGNOSIS — M7660 Achilles tendinitis, unspecified leg: Secondary | ICD-10-CM | POA: Diagnosis not present

## 2023-12-27 DIAGNOSIS — F419 Anxiety disorder, unspecified: Secondary | ICD-10-CM | POA: Diagnosis not present

## 2023-12-27 DIAGNOSIS — Z6833 Body mass index (BMI) 33.0-33.9, adult: Secondary | ICD-10-CM | POA: Diagnosis not present

## 2023-12-27 DIAGNOSIS — I255 Ischemic cardiomyopathy: Secondary | ICD-10-CM | POA: Diagnosis not present

## 2023-12-31 ENCOUNTER — Ambulatory Visit (INDEPENDENT_AMBULATORY_CARE_PROVIDER_SITE_OTHER): Admitting: Primary Care

## 2023-12-31 ENCOUNTER — Encounter: Payer: Self-pay | Admitting: Primary Care

## 2023-12-31 VITALS — BP 108/62 | HR 73 | Temp 97.1°F | Ht 71.0 in | Wt 242.0 lb

## 2023-12-31 DIAGNOSIS — E785 Hyperlipidemia, unspecified: Secondary | ICD-10-CM

## 2023-12-31 DIAGNOSIS — F5104 Psychophysiologic insomnia: Secondary | ICD-10-CM

## 2023-12-31 DIAGNOSIS — I420 Dilated cardiomyopathy: Secondary | ICD-10-CM

## 2023-12-31 DIAGNOSIS — Z95810 Presence of automatic (implantable) cardiac defibrillator: Secondary | ICD-10-CM

## 2023-12-31 DIAGNOSIS — G4733 Obstructive sleep apnea (adult) (pediatric): Secondary | ICD-10-CM

## 2023-12-31 DIAGNOSIS — I5022 Chronic systolic (congestive) heart failure: Secondary | ICD-10-CM | POA: Diagnosis not present

## 2023-12-31 DIAGNOSIS — I472 Ventricular tachycardia, unspecified: Secondary | ICD-10-CM | POA: Insufficient documentation

## 2023-12-31 DIAGNOSIS — E118 Type 2 diabetes mellitus with unspecified complications: Secondary | ICD-10-CM

## 2023-12-31 DIAGNOSIS — I10 Essential (primary) hypertension: Secondary | ICD-10-CM

## 2023-12-31 DIAGNOSIS — Z0001 Encounter for general adult medical examination with abnormal findings: Secondary | ICD-10-CM

## 2023-12-31 MED ORDER — MIRTAZAPINE 15 MG PO TABS: 15.0000 mg | ORAL_TABLET | Freq: Every day | ORAL | 0 refills | Status: DC

## 2023-12-31 NOTE — Assessment & Plan Note (Addendum)
 Following with EP. Reviewed remove pacer check from November 2025. Also S/P ablation in March 2025 per [facility]

## 2023-12-31 NOTE — Assessment & Plan Note (Addendum)
 S/P ablation per [facility]. Following with cardiology and EP through [facility]. Reviewed notes and procedure from March 2025. Reviewed office notes from April 2025.  Continue Eliquis 5 mg BID until completed. Resume Plavix 75 mg daily when instructed.  Continue metoprolol succinate 200 mg daily. Continue Solatolol 320 mg BID Continue Mexiletine 200 mg.

## 2023-12-31 NOTE — Assessment & Plan Note (Signed)
 Uncontrolled despite multiple attempts of treatment. Reviewed neurology notes from January 2025 through [facility].  Start mirtazapine 15 mg at bedtime.  He will update in a few days. Recommended he resume CPAP machine. Referral placed to psychiatry for further evaluation.

## 2023-12-31 NOTE — Assessment & Plan Note (Signed)
 Stable. Reviewed A1C from February 2025  Following with endocrinology.  Continue Ozempic 2 mg weekly, Insulin pump.

## 2023-12-31 NOTE — Progress Notes (Signed)
 Subjective:    Patient ID: Brian Williams, male    DOB: [birth year], 63 y.o.   MRN: [id number]  HPI  Brian Williams is a very pleasant 63 y.o. male with significant medical history including hypertension, CHF, cardiomyopathy, CAD, OSA on CPAP, type 2 diabetes, diabetic neuropathy, chronic back pain, GAD who presents today for complete physical and follow up of chronic conditions.  Immunizations: -Tetanus: Completed in 2022 -Influenza: Completed this season  -Shingles: Completed Shingrix series -Pneumonia: Completed Pneumovax 23 in 2019  Diet: Fair diet.  Exercise: No regular exercise.  Eye exam: Completes annually  Dental exam: Completes semi-annually    Colonoscopy: Completed in 2023, due 2030  PSA: Due   BP Readings from Last 3 Encounters:  12/31/23 108/62  08/12/23 (!) 96/54  08/02/23 120/60         Review of Systems  Constitutional:  Negative for unexpected weight change.  HENT:  Negative for rhinorrhea.   Respiratory:  Negative for cough and shortness of breath.   Cardiovascular:  Negative for chest pain.  Gastrointestinal:  Negative for constipation and diarrhea.  Genitourinary:  Negative for difficulty urinating.  Musculoskeletal:  Positive for arthralgias and back pain.  Skin:  Negative for rash.  Allergic/Immunologic: Negative for environmental allergies.  Neurological:  Negative for dizziness, numbness and headaches.  Psychiatric/Behavioral:  Positive for sleep disturbance.          Past Medical History:  Diagnosis Date   Acute appendicitis 04/03/2021   Acute pyelonephritis 09/07/2021   AICD (automatic cardioverter/defibrillator) present    Allergy    Anxiety    takes Xanax as needed   Appendicitis    Arthritis    back,knees,right shoulder   Back pain    Bacteremia due to Klebsiella pneumoniae 09/07/2021   Cardiomyopathy (HCC)    CHF (congestive heart failure) (HCC)    takes Lasix and Aldactone daily   Coronary artery disease    takes  Plavix daily   Depression    takes Zoloft daily   Diabetes mellitus without complication (HCC)    Humulin R and Farxiga daily.Fasting blood sugar runs 140   GERD (gastroesophageal reflux disease)    Headache    History of bronchitis    History of colon polyps    benign   History of hiatal hernia    Hyperlipidemia    takes Fenofibrate,Crestor, and Zetia daily   Hypertension    takes Entresto and Coreg daily   MI (myocardial infarction) (HCC) 2001   Multifocal pneumonia 09/07/2021   Obesity    Peripheral neuropathy    Persistent cough for 3 weeks or longer 07/19/2022   Pneumonia    history of-last time about 14 yrs ago   PONV (postoperative nausea and vomiting)    after knee surgery 25 yrs ago b/p stayed elevated for a while   Presence of permanent cardiac pacemaker    Sepsis (HCC) 09/06/2021   Shortness of breath dyspnea    with exertion   Sleep apnea    uses CPAP nightly   Ventricular tachycardia (HCC)    s/p RFCA PVCs 2013    Social History   Socioeconomic History   Marital status: Married    Spouse name: Arzell Mcgeehan   Number of children: 2   Years of education: Not on file   Highest education level: Not on file  Occupational History   Not on file  Tobacco Use   Smoking status: Former    Current packs/day: 0.00  Average packs/day: 1 pack/day for 25.0 years (25.0 ttl pk-yrs)    Types: Cigarettes    Start date: 05/19/1993    Quit date: 05/19/2018    Years since quitting: 5.6   Smokeless tobacco: Never  Vaping Use   Vaping status: Never Used  Substance and Sexual Activity   Alcohol use: No    Comment: rare   Drug use: Never   Sexual activity: Not Currently    Birth control/protection: None  Other Topics Concern   Not on file  Social History Narrative   Married.   Moved from [place].   Disabled.   Social Drivers of Corporate investment banker Strain: Low Risk  (12/26/2023)   Overall Financial Resource Strain (CARDIA)    Difficulty of Paying  Living Expenses: Not hard at all  Food Insecurity: No Food Insecurity (12/26/2023)   Hunger Vital Sign    Worried About Running Out of Food in the Last Year: Never true    Ran Out of Food in the Last Year: Never true  Transportation Needs: No Transportation Needs (12/26/2023)   PRAPARE - Administrator, Civil Service (Medical): No    Lack of Transportation (Non-Medical): No  Physical Activity: Inactive (12/26/2023)   Exercise Vital Sign    Days of Exercise per Week: 0 days    Minutes of Exercise per Session: 0 min  Stress: No Stress Concern Present (12/26/2023)   [employer] of Occupational Health - Occupational Stress Questionnaire    Feeling of Stress : Not at all  Social Connections: Socially Isolated (12/26/2023)   Social Connection and Isolation Panel [NHANES]    Frequency of Communication with Friends and Family: Never    Frequency of Social Gatherings with Friends and Family: Once a week    Attends Religious Services: Never    Database administrator or Organizations: No    Attends Banker Meetings: Never    Marital Status: Married  Catering manager Violence: Not At Risk (12/26/2023)   Humiliation, Afraid, Rape, and Kick questionnaire    Fear of Current or Ex-Partner: No    Emotionally Abused: No    Physically Abused: No    Sexually Abused: No    Past Surgical History:  Procedure Laterality Date   APPENDECTOMY     BACK SURGERY     CARDIAC CATHETERIZATION     CARDIAC CATHETERIZATION Left 05/10/2016   Procedure: Left Heart Cath and Coronary Angiography;  Surgeon: Antonieta Iba, MD;  Location: [facility] INVASIVE CV LAB;  Service: Cardiovascular;  Laterality: Left;   CARDIAC DEFIBRILLATOR PLACEMENT  10/16/2007   ICD Model number 2207-36 serial number [id number]   CARDIAC ELECTROPHYSIOLOGY STUDY AND ABLATION     CHOLECYSTECTOMY  2001   COLONOSCOPY     COLONOSCOPY WITH PROPOFOL N/A 08/28/2018   Procedure: COLONOSCOPY WITH PROPOFOL;  Surgeon: Wyline Mood,  MD;  Location: [facility] ENDOSCOPY;  Service: Gastroenterology;  Laterality: N/A;   COLONOSCOPY WITH PROPOFOL N/A 10/06/2021   Procedure: COLONOSCOPY WITH PROPOFOL;  Surgeon: Wyline Mood, MD;  Location: [facility] ENDOSCOPY;  Service: Gastroenterology;  Laterality: N/A;   CORONARY ANGIOPLASTY WITH STENT PLACEMENT     7 stents   CORONARY ARTERY BYPASS GRAFT N/A 05/24/2016   Procedure: CORONARY ARTERY BYPASS GRAFTING (CABG) x four , using left internal mammary artery and left leg greater saphenous vein harvested endoscopically;  Surgeon: Alleen Borne, MD;  Location: [facility] OR;  Service: Open Heart Surgery;  Laterality: N/A;   EP IMPLANTABLE DEVICE N/A  06/16/2015   Procedure: ICD Generator Changeout;  Surgeon: Verona Goodwill, MD;  Location: [facility] INVASIVE CV LAB;  Service: Cardiovascular;  Laterality: N/A;   ESOPHAGOGASTRODUODENOSCOPY     EYE SURGERY  Cataract 2019   INSERT / REPLACE / REMOVE PACEMAKER     JOINT REPLACEMENT  2024   KNEE SURGERY     bilateral knee    SPINE SURGERY  1991   [name] WITHOUT CARDIOVERSION N/A 05/24/2016   Procedure: TRANSESOPHAGEAL ECHOCARDIOGRAM ([name]);  Surgeon: Bartley Lightning, MD;  Location: [facility] OR;  Service: Open Heart Surgery;  Laterality: N/A;   Ulnar Nerve surgery Left 09/19/2022   VASECTOMY      Family History  Problem Relation Age of Onset   Heart attack Mother 72   Hypertension Mother    Melanoma Mother    Arthritis Mother    Heart attack Father 27   Hypertension Father    Hypertension Maternal Uncle    Heart disease Maternal Uncle    Heart disease Maternal Grandmother    Stroke Maternal Grandmother    Diabetes Neg Hx     Allergies  Allergen Reactions   Naproxen Other (See Comments), Anxiety and Rash    Causes hyperactivity causes hyperactivity   Morphine And Codeine Nausea And Vomiting    Morphine only    Current Outpatient Medications on File Prior to Visit  Medication Sig Dispense Refill   apixaban (ELIQUIS) 5 MG TABS tablet Take by mouth.     aspirin  EC 81 MG tablet Take 81 mg by mouth daily.  90 tablet    ezetimibe (ZETIA) 10 MG tablet Take 1 tablet (10 mg total) by mouth daily. For cholesterol. 90 tablet 0   furosemide (LASIX) 40 MG tablet Take 80 mg by mouth daily.     gemfibrozil (LOPID) 600 MG tablet TAKE 1 TABLET BY MOUTH DAILY FOR CHOLESTEROL 90 tablet 1   insulin regular human CONCENTRATED (HUMULIN R) 500 UNIT/ML injection Inject 0-25 Units into the skin daily at 12 noon.     metoprolol succinate (TOPROL-XL) 100 MG 24 hr tablet Take 200 mg by mouth daily.     mexiletine (MEXITIL) 200 MG capsule Take by mouth.     nitroGLYCERIN (NITROSTAT) 0.4 MG SL tablet Place 1 tablet (0.4 mg total) under the tongue every 5 (five) minutes as needed for chest pain. 25 tablet 1   omega-3 acid ethyl esters (LOVAZA) 1 g capsule Take 1 g by mouth 2 (two) times daily.     rosuvastatin (CRESTOR) 40 MG tablet Take 1 tablet (40 mg total) by mouth daily. 90 tablet 3   sacubitril-valsartan (ENTRESTO) 49-51 MG Take 1 tablet by mouth 2 (two) times daily.     Semaglutide, 2 MG/DOSE, (OZEMPIC, 2 MG/DOSE,) 8 MG/3ML SOPN Inject 2 mg into the skin every Friday.     sotalol (BETAPACE) 160 MG tablet Take 320 mg by mouth 2 (two) times daily.     spironolactone (ALDACTONE) 25 MG tablet Take 25 mg by mouth daily.     clopidogrel (PLAVIX) 75 MG tablet Take 1 tablet (75 mg total) by mouth daily. (Patient not taking: Reported on 12/31/2023) 90 tablet 0   No current facility-administered medications on file prior to visit.    BP 108/62   Pulse 73   Temp (!) 97.1 F (36.2 C) (Temporal)   Ht 5\' 11"  (1.803 m)   Wt 242 lb (109.8 kg)   SpO2 99%   BMI 33.75 kg/m  Objective:   Physical  Exam HENT:     Right Ear: Tympanic membrane and ear canal normal.     Left Ear: Tympanic membrane and ear canal normal.  Eyes:     Pupils: Pupils are equal, round, and reactive to light.  Cardiovascular:     Rate and Rhythm: Normal rate and regular rhythm.  Pulmonary:     Effort:  Pulmonary effort is normal.     Breath sounds: Normal breath sounds.  Abdominal:     General: Bowel sounds are normal.     Palpations: Abdomen is soft.     Tenderness: There is no abdominal tenderness.  Musculoskeletal:        General: Normal range of motion.     Cervical back: Neck supple.  Skin:    General: Skin is warm and dry.  Neurological:     Mental Status: He is alert and oriented to person, place, and time.     Cranial Nerves: No cranial nerve deficit.     Deep Tendon Reflexes:     Reflex Scores:      Patellar reflexes are 2+ on the right side and 2+ on the left side. Psychiatric:        Mood and Affect: Mood normal.           Assessment & Plan:  Encounter for annual general medical examination with abnormal findings in adult Assessment & Plan: Immunizations UTD. Colonoscopy UTD, due 2030   Discussed the importance of a healthy diet and regular exercise in order for weight loss, and to reduce the risk of further co-morbidity.  Exam stable. Labs pending.  Follow up in 1 year for repeat physical.    Congestive dilated cardiomyopathy (HCC) Assessment & Plan: Appears stable today.  Following with cardiology through [facility] and [facility]. Office notes reviewed from November 2024 through [facility] and January and March 2025 through [facility] Everywhere.  Continue Entresto 49/51 mg BID, furosemide 40 mg twice daily, spironolactone 25 mg daily, metoprolol succinate 200 mg daily..    Chronic systolic heart failure (HCC) Assessment & Plan: Appears euvolemic today.  Following with cardiology through [facility], office notes reviewed from January 2025 in March 2025. Continue furosemide 40 mg twice daily, spironolactone 25 mg daily, metoprolol succinate 200 mg daily.   Essential hypertension Assessment & Plan: Stable.  Continue Entresto 49/51 mg BID, metoprolol succinate 200 mg daily, spironolactone 25 mg daily. CMP reviewed from care everywhere from April  2025.   ICD (implantable cardioverter-defibrillator) in place Assessment & Plan: Following with EP. Reviewed remove pacer check from November 2025. Also S/P ablation in March 2025 per [facility]   Ventricular tachycardia [facility]) Assessment & Plan: S/P ablation per [facility]. Following with cardiology and EP through [facility]. Reviewed notes and procedure from March 2025. Reviewed office notes from April 2025.  Continue Eliquis 5 mg BID until completed. Resume Plavix 75 mg daily when instructed.  Continue metoprolol succinate 200 mg daily. Continue Solatolol 320 mg BID Continue Mexiletine 200 mg.   OSA on CPAP Assessment & Plan: Not on CPAP, discussed importance of resuming and nightly use.   Type 2 diabetes mellitus with complications (HCC) Assessment & Plan: Stable. Reviewed A1C from February 2025  Following with endocrinology.  Continue Ozempic 2 mg weekly, Insulin pump.   Chronic insomnia Assessment & Plan: Uncontrolled despite multiple attempts of treatment. Reviewed neurology notes from January 2025 through Care Everywhere.  Start mirtazapine 15 mg at bedtime.  He will update in a few days. Recommended he resume CPAP machine.  Referral placed to psychiatry for further evaluation.  Orders: -     Ambulatory referral to Psychiatry -     Mirtazapine; Take 1 tablet (15 mg total) by mouth at bedtime. For sleep  Dispense: 90 tablet; Refill: 0  Hyperlipidemia, unspecified hyperlipidemia type Assessment & Plan: Controlled.  Continue Zetia 10 mg, rosuvastatin 40 mg daily, gemfibrozil 600 mg daily.         Hodaya Curto K Dorotea Hand, NP

## 2023-12-31 NOTE — Assessment & Plan Note (Signed)
 Not on CPAP, discussed importance of resuming and nightly use.

## 2023-12-31 NOTE — Assessment & Plan Note (Signed)
 Controlled.  Continue Zetia 10 mg, rosuvastatin 40 mg daily, gemfibrozil 600 mg daily.

## 2023-12-31 NOTE — Assessment & Plan Note (Signed)
 Stable.  Continue Entresto 49/51 mg BID, metoprolol succinate 200 mg daily, spironolactone 25 mg daily. CMP reviewed from care everywhere from April 2025.

## 2023-12-31 NOTE — Patient Instructions (Addendum)
 You will either be contacted via phone regarding your referral to psychiatry, or you may receive a letter on your MyChart portal from our referral team with instructions for scheduling an appointment. Please let us  know if you have not been contacted by anyone within two weeks.  Start mirtazapine 15 mg at bedtime for sleep. Please update.   It was a pleasure to see you today!

## 2023-12-31 NOTE — Assessment & Plan Note (Signed)
 Appears stable today.  Following with cardiology through [facility] and [facility]. Office notes reviewed from November 2024 through [facility] and January and March 2025 through Care Everywhere.  Continue Entresto 49/51 mg BID, furosemide 40 mg twice daily, spironolactone 25 mg daily, metoprolol succinate 200 mg daily.Aaron Aas

## 2023-12-31 NOTE — Assessment & Plan Note (Signed)
 Appears euvolemic today.  Following with cardiology through [facility], office notes reviewed from January 2025 in March 2025. Continue furosemide 40 mg twice daily, spironolactone 25 mg daily, metoprolol succinate 200 mg daily.

## 2023-12-31 NOTE — Assessment & Plan Note (Signed)
Immunizations UTD. Colonoscopy UTD, due 2030  Discussed the importance of a healthy diet and regular exercise in order for weight loss, and to reduce the risk of further co-morbidity.  Exam stable. Labs pending.  Follow up in 1 year for repeat physical.

## 2024-01-02 DIAGNOSIS — Z794 Long term (current) use of insulin: Secondary | ICD-10-CM | POA: Diagnosis not present

## 2024-01-02 DIAGNOSIS — E118 Type 2 diabetes mellitus with unspecified complications: Secondary | ICD-10-CM | POA: Diagnosis not present

## 2024-01-09 DIAGNOSIS — Z794 Long term (current) use of insulin: Secondary | ICD-10-CM | POA: Diagnosis not present

## 2024-01-09 DIAGNOSIS — E118 Type 2 diabetes mellitus with unspecified complications: Secondary | ICD-10-CM | POA: Diagnosis not present

## 2024-01-14 DIAGNOSIS — E118 Type 2 diabetes mellitus with unspecified complications: Secondary | ICD-10-CM | POA: Diagnosis not present

## 2024-01-16 DIAGNOSIS — E118 Type 2 diabetes mellitus with unspecified complications: Secondary | ICD-10-CM | POA: Diagnosis not present

## 2024-01-16 DIAGNOSIS — Z794 Long term (current) use of insulin: Secondary | ICD-10-CM | POA: Diagnosis not present

## 2024-01-23 DIAGNOSIS — E118 Type 2 diabetes mellitus with unspecified complications: Secondary | ICD-10-CM | POA: Diagnosis not present

## 2024-01-23 DIAGNOSIS — Z794 Long term (current) use of insulin: Secondary | ICD-10-CM | POA: Diagnosis not present

## 2024-01-28 DIAGNOSIS — I2510 Atherosclerotic heart disease of native coronary artery without angina pectoris: Secondary | ICD-10-CM | POA: Diagnosis not present

## 2024-01-28 DIAGNOSIS — I472 Ventricular tachycardia, unspecified: Secondary | ICD-10-CM | POA: Diagnosis not present

## 2024-01-28 DIAGNOSIS — G4733 Obstructive sleep apnea (adult) (pediatric): Secondary | ICD-10-CM | POA: Diagnosis not present

## 2024-01-28 DIAGNOSIS — Z720 Tobacco use: Secondary | ICD-10-CM | POA: Diagnosis not present

## 2024-01-28 DIAGNOSIS — I10 Essential (primary) hypertension: Secondary | ICD-10-CM | POA: Diagnosis not present

## 2024-01-28 DIAGNOSIS — Z95810 Presence of automatic (implantable) cardiac defibrillator: Secondary | ICD-10-CM | POA: Diagnosis not present

## 2024-01-28 DIAGNOSIS — I5022 Chronic systolic (congestive) heart failure: Secondary | ICD-10-CM | POA: Diagnosis not present

## 2024-01-28 DIAGNOSIS — I255 Ischemic cardiomyopathy: Secondary | ICD-10-CM | POA: Diagnosis not present

## 2024-01-30 DIAGNOSIS — Z794 Long term (current) use of insulin: Secondary | ICD-10-CM | POA: Diagnosis not present

## 2024-01-30 DIAGNOSIS — E118 Type 2 diabetes mellitus with unspecified complications: Secondary | ICD-10-CM | POA: Diagnosis not present

## 2024-02-03 DIAGNOSIS — Z886 Allergy status to analgesic agent status: Secondary | ICD-10-CM | POA: Diagnosis not present

## 2024-02-03 DIAGNOSIS — Z7985 Long-term (current) use of injectable non-insulin antidiabetic drugs: Secondary | ICD-10-CM | POA: Diagnosis not present

## 2024-02-03 DIAGNOSIS — I2510 Atherosclerotic heart disease of native coronary artery without angina pectoris: Secondary | ICD-10-CM | POA: Diagnosis not present

## 2024-02-03 DIAGNOSIS — I480 Paroxysmal atrial fibrillation: Secondary | ICD-10-CM | POA: Diagnosis not present

## 2024-02-03 DIAGNOSIS — I472 Ventricular tachycardia, unspecified: Secondary | ICD-10-CM | POA: Diagnosis not present

## 2024-02-03 DIAGNOSIS — I493 Ventricular premature depolarization: Secondary | ICD-10-CM | POA: Diagnosis not present

## 2024-02-03 DIAGNOSIS — D649 Anemia, unspecified: Secondary | ICD-10-CM | POA: Diagnosis not present

## 2024-02-03 DIAGNOSIS — Z6833 Body mass index (BMI) 33.0-33.9, adult: Secondary | ICD-10-CM | POA: Diagnosis not present

## 2024-02-03 DIAGNOSIS — Z95810 Presence of automatic (implantable) cardiac defibrillator: Secondary | ICD-10-CM | POA: Diagnosis not present

## 2024-02-03 DIAGNOSIS — I110 Hypertensive heart disease with heart failure: Secondary | ICD-10-CM | POA: Diagnosis not present

## 2024-02-03 DIAGNOSIS — I255 Ischemic cardiomyopathy: Secondary | ICD-10-CM | POA: Diagnosis not present

## 2024-02-03 DIAGNOSIS — Z79899 Other long term (current) drug therapy: Secondary | ICD-10-CM | POA: Diagnosis not present

## 2024-02-03 DIAGNOSIS — E119 Type 2 diabetes mellitus without complications: Secondary | ICD-10-CM | POA: Diagnosis not present

## 2024-02-03 DIAGNOSIS — G4733 Obstructive sleep apnea (adult) (pediatric): Secondary | ICD-10-CM | POA: Diagnosis not present

## 2024-02-03 DIAGNOSIS — Z794 Long term (current) use of insulin: Secondary | ICD-10-CM | POA: Diagnosis not present

## 2024-02-03 DIAGNOSIS — Z7982 Long term (current) use of aspirin: Secondary | ICD-10-CM | POA: Diagnosis not present

## 2024-02-03 DIAGNOSIS — E785 Hyperlipidemia, unspecified: Secondary | ICD-10-CM | POA: Diagnosis not present

## 2024-02-03 DIAGNOSIS — Z87891 Personal history of nicotine dependence: Secondary | ICD-10-CM | POA: Diagnosis not present

## 2024-02-03 DIAGNOSIS — I5022 Chronic systolic (congestive) heart failure: Secondary | ICD-10-CM | POA: Diagnosis not present

## 2024-02-03 DIAGNOSIS — E669 Obesity, unspecified: Secondary | ICD-10-CM | POA: Diagnosis not present

## 2024-02-03 DIAGNOSIS — M1990 Unspecified osteoarthritis, unspecified site: Secondary | ICD-10-CM | POA: Diagnosis not present

## 2024-02-03 DIAGNOSIS — Z885 Allergy status to narcotic agent status: Secondary | ICD-10-CM | POA: Diagnosis not present

## 2024-02-04 DIAGNOSIS — G8929 Other chronic pain: Secondary | ICD-10-CM | POA: Diagnosis not present

## 2024-02-04 DIAGNOSIS — D6869 Other thrombophilia: Secondary | ICD-10-CM | POA: Diagnosis not present

## 2024-02-05 DIAGNOSIS — I472 Ventricular tachycardia, unspecified: Secondary | ICD-10-CM | POA: Diagnosis not present

## 2024-02-05 DIAGNOSIS — I480 Paroxysmal atrial fibrillation: Secondary | ICD-10-CM | POA: Diagnosis not present

## 2024-02-05 DIAGNOSIS — Z95810 Presence of automatic (implantable) cardiac defibrillator: Secondary | ICD-10-CM | POA: Diagnosis not present

## 2024-02-06 DIAGNOSIS — Z794 Long term (current) use of insulin: Secondary | ICD-10-CM | POA: Diagnosis not present

## 2024-02-06 DIAGNOSIS — E118 Type 2 diabetes mellitus with unspecified complications: Secondary | ICD-10-CM | POA: Diagnosis not present

## 2024-02-07 ENCOUNTER — Ambulatory Visit: Admitting: Cardiovascular Disease

## 2024-02-11 DIAGNOSIS — Z7982 Long term (current) use of aspirin: Secondary | ICD-10-CM | POA: Diagnosis not present

## 2024-02-11 DIAGNOSIS — I110 Hypertensive heart disease with heart failure: Secondary | ICD-10-CM | POA: Diagnosis not present

## 2024-02-11 DIAGNOSIS — G4733 Obstructive sleep apnea (adult) (pediatric): Secondary | ICD-10-CM | POA: Diagnosis not present

## 2024-02-11 DIAGNOSIS — E785 Hyperlipidemia, unspecified: Secondary | ICD-10-CM | POA: Diagnosis not present

## 2024-02-11 DIAGNOSIS — Z951 Presence of aortocoronary bypass graft: Secondary | ICD-10-CM | POA: Diagnosis not present

## 2024-02-11 DIAGNOSIS — Z7902 Long term (current) use of antithrombotics/antiplatelets: Secondary | ICD-10-CM | POA: Diagnosis not present

## 2024-02-11 DIAGNOSIS — Z885 Allergy status to narcotic agent status: Secondary | ICD-10-CM | POA: Diagnosis not present

## 2024-02-11 DIAGNOSIS — I493 Ventricular premature depolarization: Secondary | ICD-10-CM | POA: Diagnosis not present

## 2024-02-11 DIAGNOSIS — Z794 Long term (current) use of insulin: Secondary | ICD-10-CM | POA: Diagnosis not present

## 2024-02-11 DIAGNOSIS — I472 Ventricular tachycardia, unspecified: Secondary | ICD-10-CM | POA: Diagnosis not present

## 2024-02-11 DIAGNOSIS — R0789 Other chest pain: Secondary | ICD-10-CM | POA: Diagnosis not present

## 2024-02-11 DIAGNOSIS — D649 Anemia, unspecified: Secondary | ICD-10-CM | POA: Diagnosis not present

## 2024-02-11 DIAGNOSIS — I255 Ischemic cardiomyopathy: Secondary | ICD-10-CM | POA: Diagnosis not present

## 2024-02-11 DIAGNOSIS — Z7985 Long-term (current) use of injectable non-insulin antidiabetic drugs: Secondary | ICD-10-CM | POA: Diagnosis not present

## 2024-02-11 DIAGNOSIS — E669 Obesity, unspecified: Secondary | ICD-10-CM | POA: Diagnosis not present

## 2024-02-11 DIAGNOSIS — E119 Type 2 diabetes mellitus without complications: Secondary | ICD-10-CM | POA: Diagnosis not present

## 2024-02-11 DIAGNOSIS — Z6833 Body mass index (BMI) 33.0-33.9, adult: Secondary | ICD-10-CM | POA: Diagnosis not present

## 2024-02-11 DIAGNOSIS — I5022 Chronic systolic (congestive) heart failure: Secondary | ICD-10-CM | POA: Diagnosis not present

## 2024-02-11 DIAGNOSIS — I2510 Atherosclerotic heart disease of native coronary artery without angina pectoris: Secondary | ICD-10-CM | POA: Diagnosis not present

## 2024-02-13 DIAGNOSIS — Z95811 Presence of heart assist device: Secondary | ICD-10-CM | POA: Diagnosis not present

## 2024-02-13 DIAGNOSIS — Z794 Long term (current) use of insulin: Secondary | ICD-10-CM | POA: Diagnosis not present

## 2024-02-13 DIAGNOSIS — I5042 Chronic combined systolic (congestive) and diastolic (congestive) heart failure: Secondary | ICD-10-CM | POA: Diagnosis not present

## 2024-02-13 DIAGNOSIS — E118 Type 2 diabetes mellitus with unspecified complications: Secondary | ICD-10-CM | POA: Diagnosis not present

## 2024-02-20 DIAGNOSIS — E118 Type 2 diabetes mellitus with unspecified complications: Secondary | ICD-10-CM | POA: Diagnosis not present

## 2024-02-20 DIAGNOSIS — Z794 Long term (current) use of insulin: Secondary | ICD-10-CM | POA: Diagnosis not present

## 2024-02-25 DIAGNOSIS — E1169 Type 2 diabetes mellitus with other specified complication: Secondary | ICD-10-CM | POA: Diagnosis not present

## 2024-02-25 DIAGNOSIS — I5022 Chronic systolic (congestive) heart failure: Secondary | ICD-10-CM | POA: Diagnosis not present

## 2024-02-25 DIAGNOSIS — D649 Anemia, unspecified: Secondary | ICD-10-CM | POA: Diagnosis not present

## 2024-02-25 DIAGNOSIS — Z794 Long term (current) use of insulin: Secondary | ICD-10-CM | POA: Diagnosis not present

## 2024-02-26 DIAGNOSIS — G4733 Obstructive sleep apnea (adult) (pediatric): Secondary | ICD-10-CM

## 2024-02-27 DIAGNOSIS — E118 Type 2 diabetes mellitus with unspecified complications: Secondary | ICD-10-CM | POA: Diagnosis not present

## 2024-02-27 DIAGNOSIS — Z794 Long term (current) use of insulin: Secondary | ICD-10-CM | POA: Diagnosis not present

## 2024-03-05 ENCOUNTER — Ambulatory Visit (INDEPENDENT_AMBULATORY_CARE_PROVIDER_SITE_OTHER): Admitting: Sleep Medicine

## 2024-03-05 ENCOUNTER — Encounter: Payer: Self-pay | Admitting: Sleep Medicine

## 2024-03-05 VITALS — BP 110/70 | HR 79 | Temp 97.1°F | Ht 71.0 in | Wt 236.2 lb

## 2024-03-05 DIAGNOSIS — G4733 Obstructive sleep apnea (adult) (pediatric): Secondary | ICD-10-CM | POA: Diagnosis not present

## 2024-03-05 DIAGNOSIS — E1169 Type 2 diabetes mellitus with other specified complication: Secondary | ICD-10-CM | POA: Diagnosis not present

## 2024-03-05 DIAGNOSIS — Z794 Long term (current) use of insulin: Secondary | ICD-10-CM | POA: Diagnosis not present

## 2024-03-05 DIAGNOSIS — E785 Hyperlipidemia, unspecified: Secondary | ICD-10-CM | POA: Diagnosis not present

## 2024-03-05 DIAGNOSIS — I10 Essential (primary) hypertension: Secondary | ICD-10-CM

## 2024-03-05 DIAGNOSIS — Z87891 Personal history of nicotine dependence: Secondary | ICD-10-CM

## 2024-03-05 DIAGNOSIS — E118 Type 2 diabetes mellitus with unspecified complications: Secondary | ICD-10-CM | POA: Diagnosis not present

## 2024-03-05 DIAGNOSIS — F5104 Psychophysiologic insomnia: Secondary | ICD-10-CM

## 2024-03-05 DIAGNOSIS — G4700 Insomnia, unspecified: Secondary | ICD-10-CM

## 2024-03-05 NOTE — Progress Notes (Signed)
 Name:Brian Williams MRN: [id number] DOB: 17-Jan-1961   CHIEF COMPLAINT:  REASSESSMENT OF OSA   HISTORY OF PRESENT ILLNESS:  Brian Williams is a 63 y.o. w/ a h/o OSA, CAD, HTN, DMII, hyperlipemia and obesity who presents to reestablish care for OSA. Reports that he discontinued CPAP therapy 2 years ago due to having ulnar nerve surgery and was unable to put his CPAP mask on. Reports not being able to use CPAP therapy since then due to getting out of the habit. Reports a 70 lb weight loss over the last few years. Reports excessive daytime sleepiness and fatigue. Reports nocturnal awakenings due to unclear reasons and has falling back to sleep. Admits to dry mouth. Denies morning headaches, RLS symptoms, dream enactment, cataplexy, hypnagogic or hypnapompic hallucinations. Denies a family history of sleep apnea. Denies drowsy driving. Drinks 1-2 cups of coffee every morning and 3 sodas daily, denies alcohol  or illicit drug use. Reports constant second hand smoke exposure. States that he naps for 30-45 mins almost every day.   Bedtime 3:30-4:30 am Sleep onset 45 mins Rise time 9:30 am   EPWORTH SLEEP SCORE 10    03/05/2024   11:00 AM 08/08/2020   12:00 PM  Results of the Epworth flowsheet  Sitting and reading 2 2  Watching TV 2 2  Sitting, inactive in a public place (e.g. a theatre or a meeting) 1 0  As a passenger in a car for an hour without a break 1 0  Lying down to rest in the afternoon when circumstances permit 1 2  Sitting and talking to someone 1 0  Sitting quietly after a lunch without alcohol  1 1  In a car, while stopped for a few minutes in traffic 1 0  Total score 10 7    PAST MEDICAL HISTORY :   has a past medical history of Acute appendicitis (04/03/2021), Acute pyelonephritis (09/07/2021), AICD (automatic cardioverter/defibrillator) present, Allergy, Anxiety, Appendicitis, Arthritis, Back pain, Bacteremia due to Klebsiella pneumoniae (09/07/2021), Cardiomyopathy  (HCC), CHF (congestive heart failure) (HCC), Coronary artery disease, Depression, Diabetes mellitus without complication (HCC), GERD (gastroesophageal reflux disease), Headache, History of bronchitis, History of colon polyps, History of hiatal hernia, Hyperlipidemia, Hypertension, MI (myocardial infarction) (HCC) (2001), Multifocal pneumonia (09/07/2021), Obesity, Peripheral neuropathy, Persistent cough for 3 weeks or longer (07/19/2022), Pneumonia, PONV (postoperative nausea and vomiting), Presence of permanent cardiac pacemaker, Sepsis (HCC) (09/06/2021), Shortness of breath dyspnea, Sleep apnea, and Ventricular tachycardia (HCC).  has a past surgical history that includes Cardiac electrophysiology study and ablation; Cardiac catheterization; Knee surgery; Back surgery; Cardiac defibrillator placement (10/16/2007); Insert / replace / remove pacemaker; Cardiac catheterization (N/A, 06/16/2015); Cholecystectomy (2001); Cardiac catheterization (Left, 05/10/2016); Coronary angioplasty with stent; Colonoscopy; Esophagogastroduodenoscopy; Vasectomy; Coronary artery bypass graft (N/A, 05/24/2016); [name] without cardioversion (N/A, 05/24/2016); Colonoscopy with propofol  (N/A, 08/28/2018); Appendectomy; Colonoscopy with propofol  (N/A, 10/06/2021); Ulnar Nerve surgery (Left, 09/19/2022); Joint replacement (2024); Eye surgery (Cataract 2019); and Spine surgery (1991). Prior to Admission medications   Medication Sig Start Date End Date Taking? Authorizing Provider  aspirin  EC 81 MG tablet Take 81 mg by mouth daily.  06/21/16  Yes Gollan, Timothy J, MD  clopidogrel  (PLAVIX ) 75 MG tablet Take 1 tablet (75 mg total) by mouth daily. 08/25/21  Yes Clark, Katherine K, NP  ezetimibe  (ZETIA ) 10 MG tablet Take 1 tablet (10 mg total) by mouth daily. For cholesterol. 08/25/21  Yes Clark, Katherine K, NP  furosemide  (LASIX ) 40 MG tablet Take 80 mg by mouth  daily. 10/19/21  Yes [provider]  gemfibrozil  (LOPID ) 600 MG tablet  TAKE 1 TABLET BY MOUTH DAILY FOR CHOLESTEROL 06/13/23  Yes Clark, Katherine K, NP  HYDROcodone -acetaminophen  (NORCO/VICODIN) 5-325 MG tablet Take 1 tablet by mouth as needed. 02/24/24  Yes [provider]  insulin  regular human CONCENTRATED (HUMULIN R ) 500 UNIT/ML injection Inject 0-25 Units into the skin daily at 12 noon.   Yes [provider]  metoprolol  succinate (TOPROL -XL) 100 MG 24 hr tablet Take 200 mg by mouth daily. 03/14/21  Yes [provider]  nitroGLYCERIN  (NITROSTAT ) 0.4 MG SL tablet Place 1 tablet (0.4 mg total) under the tongue every 5 (five) minutes as needed for chest pain. 04/28/21  Yes Clark, Katherine K, NP  omega-3 acid ethyl esters (LOVAZA ) 1 g capsule Take 1 g by mouth 2 (two) times daily. 03/22/21  Yes [provider]  rosuvastatin  (CRESTOR ) 40 MG tablet Take 1 tablet (40 mg total) by mouth daily. 01/15/23  Yes Gollan, Timothy J, MD  sacubitril -valsartan  (ENTRESTO ) 49-51 MG Take 1 tablet by mouth 2 (two) times daily.   Yes [provider]  Semaglutide , 2 MG/DOSE, (OZEMPIC , 2 MG/DOSE,) 8 MG/3ML SOPN Inject 2 mg into the skin every Friday.   Yes [provider]  sotalol (BETAPACE) 160 MG tablet Take 320 mg by mouth 2 (two) times daily.   Yes [provider]  spironolactone  (ALDACTONE ) 25 MG tablet Take 25 mg by mouth daily.   Yes [provider]   Allergies  Allergen Reactions   Naproxen Other (See Comments), Anxiety and Rash    Causes hyperactivity causes hyperactivity   Morphine  And Codeine Nausea And Vomiting    Morphine  only    FAMILY HISTORY:  family history includes Arthritis in his mother; Heart attack (age of onset: 37) in his mother; Heart attack (age of onset: 97) in his father; Heart disease in his maternal grandmother and maternal uncle; Hypertension in his father, maternal uncle, and mother; Melanoma in his mother; Stroke in his maternal grandmother. SOCIAL HISTORY:  reports that he quit  smoking about 5 years ago. His smoking use included cigarettes. He started smoking about 30 years ago. He has a 25 pack-year smoking history. He has never used smokeless tobacco. He reports that he does not drink alcohol  and does not use drugs.   Review of Systems:  Gen:  Denies  fever, sweats, chills weight loss  HEENT: Denies blurred vision, double vision, ear pain, eye pain, hearing loss, nose bleeds, sore throat Cardiac:  No dizziness, chest pain or heaviness, chest tightness,edema, No JVD Resp:   No cough, -sputum production, -shortness of breath,-wheezing, -hemoptysis,  Gi: Denies swallowing difficulty, stomach pain, nausea or vomiting, diarrhea, constipation, bowel incontinence Gu:  Denies bladder incontinence, burning urine Ext:   Denies Joint pain, stiffness or swelling Skin: Denies  skin rash, easy bruising or bleeding or hives Endoc:  Denies polyuria, polydipsia , polyphagia or weight change Psych:   Denies depression, insomnia or hallucinations  Other:  All other systems negative  VITAL SIGNS: BP 110/70 (BP Location: Right Arm, Cuff Size: Large)   Pulse 79   Temp (!) 97.1 F (36.2 C)   Ht 5' 11 (1.803 m)   Wt 236 lb 3.2 oz (107.1 kg)   SpO2 96%   BMI 32.94 kg/m    Physical Examination:   General Appearance: No distress  EYES PERRLA, EOM intact.   NECK Supple, No JVD Pulmonary: normal breath sounds, No wheezing.  CardiovascularNormal S1,S2.  No m/r/g.   Abdomen: Benign, Soft, non-tender. Skin:   warm, no rashes, no ecchymosis  Extremities: normal, no cyanosis, clubbing. Neuro:without focal findings,  speech normal  PSYCHIATRIC: Mood, affect within normal limits.   ASSESSMENT AND PLAN  OSA Restarting patient on CPAP therapy, adjusted pressure setting to auto 4-16 cm H2O. Also fit patient with the Airfit F40 FFM. Discussed the consequences of untreated sleep apnea. Advised not to drive drowsy for safety of patient and others. Will follow up in 3 months.     HTN Stable, on current management. Following with PCP.   Hyperlipidemia Stable, on current management.  DMII Stable, on current management.   Insomnia Counseled patient on stimulus control and improve sleep hygiene practices.    Patient  satisfied with Plan of action and management. All questions answered  I spent a total of 47 minutes reviewing chart data, face-to-face evaluation with the patient, counseling and coordination of care as detailed above.    Janitza Revuelta, M.D.  Sleep Medicine  Pulmonary & Critical Care Medicine

## 2024-03-05 NOTE — Patient Instructions (Signed)
 Brian Williams

## 2024-03-06 DIAGNOSIS — D649 Anemia, unspecified: Secondary | ICD-10-CM | POA: Diagnosis not present

## 2024-03-06 DIAGNOSIS — Z794 Long term (current) use of insulin: Secondary | ICD-10-CM | POA: Diagnosis not present

## 2024-03-06 DIAGNOSIS — E1169 Type 2 diabetes mellitus with other specified complication: Secondary | ICD-10-CM | POA: Diagnosis not present

## 2024-03-09 DIAGNOSIS — Z95810 Presence of automatic (implantable) cardiac defibrillator: Secondary | ICD-10-CM | POA: Diagnosis not present

## 2024-03-09 DIAGNOSIS — I5042 Chronic combined systolic (congestive) and diastolic (congestive) heart failure: Secondary | ICD-10-CM | POA: Diagnosis not present

## 2024-03-09 DIAGNOSIS — Z4502 Encounter for adjustment and management of automatic implantable cardiac defibrillator: Secondary | ICD-10-CM | POA: Diagnosis not present

## 2024-03-09 NOTE — Progress Notes (Unsigned)
 Psychiatric Initial Adult Assessment   Patient Identification: Brian Williams MRN:  [id number] Date of Evaluation:  03/11/2024 Referral Source: PCP Chief Complaint:   Chief Complaint  Patient presents with   Establish Care   Visit Diagnosis:    ICD-10-CM   1. Primary insomnia  F51.01 ramelteon (ROZEREM) 8 MG tablet    Suvorexant (BELSOMRA) 5 MG TABS    DISCONTINUED: doxepin (SINEQUAN) 10 MG capsule    2. OSA on CPAP  G47.33      Assessment:  Brian Williams is a 63 y.o. male with a history of GAD, OSA, Congestive dilated cardiomyopathy, CHF, CAD, HTN, DMII, hyperlipemia and obesity  who presents in person to [facility] for initial evaluation on 03/11/2024.    At initial evaluation patient reports symptoms of insomnia ongoing since August 2024.  Patient denies difficulty with this prior to that date.  He describes difficulty with sleep initiation and sleep maintenance.  Reportedly sleeping an average of 3.5 to 4 hours a night which is broken up into 1 hour increments.  Patient endorses fatigue and difficulty with concentration following day.  He will occasionally take a 30 to 45-minute nap.  Patient has a history of GAD and MDD however on evaluation denies symptoms consistent with these prior diagnoses.  He endorsed low mood, fatigue, poor sleep, difficulty with concentration which all could be secondary to his insomnia.  Furthermore patient does have a history of sleep apnea and just restarted his CPAP machine 1 week ago.  He is still working up to wearing the mask consistently.  Given presentation patient meets criteria for insomnia as well as OSA.  Most appropriate treatment option would be CBT for insomnia.  That said this was reviewed and patient declined to move forward with it.  He was more interested in medication options.  He has tried multiple medication options without success and we did review how medications may have limited benefit for insomnia  and are not frequently a long-term solution.  Patient expressed understanding.  We will start ramelteon Belsomra risk and benefits of both medications were reviewed.  A number of assessments were performed during the evaluation today including  PHQ-9 which they scored a 7 on, GAD-7 which they scored a 2 on, and [place] suicide severity screening which showed no risk.    Risk Assessment: A suicide and violence risk assessment was performed as part of this evaluation. There patient is deemed to be at chronic elevated risk for self-harm/suicide given the following factors: N/A. These risk factors are mitigated by the following factors: lack of active SI/HI. The patient is deemed to be at chronic elevated risk for violence given the following factors: N/A. These risk factors are mitigated by the following factors: N/A. There is no acute risk for suicide or violence at this time. The patient was educated about relevant modifiable risk factors including following recommendations for treatment of psychiatric illness and abstaining from substance abuse.  While future psychiatric events cannot be accurately predicted, the patient does not currently require  acute inpatient psychiatric care and does not currently meet [place]  involuntary commitment criteria.  Patient was given contact information for crisis resources, behavioral health clinic and was instructed to call 911 for emergencies.    Plan: # OSA/insomnia Past medication trials: Mirtazapine , duloxetine , lexapro , sertraline , trazodone , amitriptyline , Xanax , gabapentin , Pregablin Status of problem: Ongoing Interventions: -- Continue with CPAP - Continue to follow with sleep medicine - Start Belsomra 5 mg at bedtime -  Start ramelteon 8 mg at bedtime  - Continue to work on sleep hygiene - Recommend CBT for insomnia, patient declined at this time  # Chronic knee pain Past medication trials:  Status of problem:  Ongoing Interventions: -Hydrocodone -Acetaminophen  5-325 mg daily as needed managed by his orthopedic surgeon   History of Present Illness:  Brian Williams presents reporting that he was referred by his PCP due to his ongoing difficulty with insomnia.  Patient reports that last August he had a knee replacement surgery which was a very painful procedure.  Due to the pain and recovery patient was unable to get comfortable sleeping anywhere besides his reclining chair.  He spent 6 weeks there during which time his sleep gradually got worse.  Patients sleep hygiene was poor during this time frequently watching TV prior to going to bed and then turning and on when he woke up the night due to pain.  After the 6-week recovery patient had very poor sleep and has not been able to return to a schedule prior to the procedure.  He reports prior to the procedure he would go to bed at 11 wake up at 630 with only occasional nighttime awakening to go to the bathroom.  When this did occur he would fall asleep without issue upon returning to bed.  However after the procedure and returning to bed patient is found that he has difficulty with sleep initiation typically not falling asleep till 2 AM at the earliest and sometimes as late as 6 or 7 in the morning.  Then when he does fall asleep it broken into 1 hour increments with patient being unable to sleep for longer stretch of time.  He believes he gets on average 3.5 to 4 hours of sleep total at night.  The following day he wakes up fatigued want to sleep more but cannot do so.  Patient denies any morning headaches, restless leg symptoms, dream enactment, nightmares cataplexy, hypnagogic, or hypnopompic hallucinations.  He reports being prescribed multiple medications over the past year to help with insomnia without benefit.  These include mirtazapine , trazodone , melatonin, gabapentin , and pregabalin .  More recently patient has connected with a sleep specialist to restart on CPAP.  He  has a diagnosis of sleep apnea and has been off CPAP for 2 years now.  He has been on it for about a week so far and is still getting accustomed to the machine.  He is aware that he can get a different mask in the future if he is unable to tolerate this 1.    Patient did report a past history of anxiety and depression though he does not necessarily agree with the diagnoses.  Patient reports this was diagnosed based off of screening he did and described the PHQ-9.  He was started on a few different medications by his primary care before finalizing on sertraline  and trazodone .  Patient had stopped both medications around January 2024 as he had felt the trazodone  was worsening his neuropathy and the sertraline  is having no effect.  He reports stopping both medications abruptly that denies any withdrawal effects or change in his sleep at that time.  On review of depressive symptoms today patient did endorse low mood, difficulty with sleep, fatigue, difficulty concentrating.  He also described passive SI in the sense that he does not feel like dealing with the insomnia any longer.  Patient denied any thoughts of acting on the passive SI or intent to do so.  On evaluation for anxiety symptoms  patient had endorsed difficulty relaxing and becoming easily irritable.  He denied any excessive worrying or racing thoughts particularly at night impacting his sleep.  Patient denied any history of manic symptoms, hallucinations, delusions, paranoia, or PTSD.  On review of current sleep hygiene patient admits that it is poor.  He does not keep to a particular sleep schedule and will frequently watch TV up until the point of feeling tired and then try to go to sleep.  When he wakes up in the middle of the night he will often watch things on his phone.  In addition patient will frequently return to his room in the bed in the middle of the day to watch things on TV as other people are in the main area watching something  different.  Patient denied alcohol  or illicit substance use.  He reports drinking 1 to 2 cups of coffee every morning and 3 sodas daily.  We discussed CBT for insomnia with the patient and explained that this is the gold standard for targeting insomnia.  After explaining how it worked patient declined to proceed forward with it.  He did not feel that he will be able to follow the protocol including getting out of bed after 15 minutes of the awake or only using the bed for sleep/sex.  Given this we discussed a few alternative medication options though expressed that the medicine might have limited benefit and instead focusing on treating sleep apnea and improving sleep hygiene would likely be the most effective.  We did consider doxepin however due to patient's history of ventricular tachycardia and current restrictions sotalol opted against this.  Instead we will start on Belsomra and ramelteon.  Risk and benefits of both medications were reviewed.  We also did recommend focusing on sleep hygiene as best as possible and increasing behavioral activation.  Currently patient rarely leaves the home and could benefit from adding some moderate exercise such as walking into his daily routine.  Past Psychiatric History:  Past psychiatric diagnoses: GAD and MDD Psychiatric hospitalizations:Denies Past suicide attempts: Denies Hx of self harm: Denies Hx of violence towards others: Denies Prior psychiatric providers: Denies Prior therapy: denies Access to firearms: Denies  Prior medication trials:  Trazodone  (reports worsened neuropathy), Lyrica , gabapentin  (ineffective), Mirtazapine  (ineffective), Cymbalta  (ineffective), sertraline  (stopped in January 2025, denies noticing any difference at that time)  Substance use: Quit smoking around 2020. His smoking use included cigarettes. He started smoking about 1995. He has a 25 pack-year smoking history. He has never used smokeless tobacco. He reports that he does  not use drugs. Drinks a 6 pack a month.  Past Medical History:  Past Medical History:  Diagnosis Date   Acute appendicitis 04/03/2021   Acute pyelonephritis 09/07/2021   AICD (automatic cardioverter/defibrillator) present    Allergy    Anxiety    takes Xanax  as needed   Appendicitis    Arthritis    back,knees,right shoulder   Back pain    Bacteremia due to Klebsiella pneumoniae 09/07/2021   Cardiomyopathy (HCC)    CHF (congestive heart failure) (HCC)    takes Lasix  and Aldactone  daily   Coronary artery disease    takes Plavix  daily   Depression    takes Zoloft  daily   Diabetes mellitus without complication (HCC)    Humulin R  and Farxiga  daily.Fasting blood sugar runs 140   GERD (gastroesophageal reflux disease)    Headache    History of bronchitis    History of colon polyps  benign   History of hiatal hernia    Hyperlipidemia    takes Fenofibrate ,Crestor , and Zetia  daily   Hypertension    takes Entresto  and Coreg  daily   MI (myocardial infarction) (HCC) 2001   Multifocal pneumonia 09/07/2021   Obesity    Peripheral neuropathy    Persistent cough for 3 weeks or longer 07/19/2022   Pneumonia    history of-last time about 14 yrs ago   PONV (postoperative nausea and vomiting)    after knee surgery 25 yrs ago b/p stayed elevated for a while   Presence of permanent cardiac pacemaker    Sepsis (HCC) 09/06/2021   Shortness of breath dyspnea    with exertion   Sleep apnea    uses CPAP nightly   Ventricular tachycardia (HCC)    s/p RFCA PVCs 2013    Past Surgical History:  Procedure Laterality Date   APPENDECTOMY     BACK SURGERY     CARDIAC CATHETERIZATION     CARDIAC CATHETERIZATION Left 05/10/2016   Procedure: Left Heart Cath and Coronary Angiography;  Surgeon: Evalene JINNY Lunger, MD;  Location: [facility] INVASIVE CV LAB;  Service: Cardiovascular;  Laterality: Left;   CARDIAC DEFIBRILLATOR PLACEMENT  10/16/2007   ICD Model number 2207-36 serial number [id number]    CARDIAC ELECTROPHYSIOLOGY STUDY AND ABLATION     CHOLECYSTECTOMY  2001   COLONOSCOPY     COLONOSCOPY WITH PROPOFOL  N/A 08/28/2018   Procedure: COLONOSCOPY WITH PROPOFOL ;  Surgeon: Therisa Bi, MD;  Location: [facility] ENDOSCOPY;  Service: Gastroenterology;  Laterality: N/A;   COLONOSCOPY WITH PROPOFOL  N/A 10/06/2021   Procedure: COLONOSCOPY WITH PROPOFOL ;  Surgeon: Therisa Bi, MD;  Location: [facility] ENDOSCOPY;  Service: Gastroenterology;  Laterality: N/A;   CORONARY ANGIOPLASTY WITH STENT PLACEMENT     7 stents   CORONARY ARTERY BYPASS GRAFT N/A 05/24/2016   Procedure: CORONARY ARTERY BYPASS GRAFTING (CABG) x four , using left internal mammary artery and left leg greater saphenous vein harvested endoscopically;  Surgeon: Dorise MARLA Fellers, MD;  Location: [facility] OR;  Service: Open Heart Surgery;  Laterality: N/A;   EP IMPLANTABLE DEVICE N/A 06/16/2015   Procedure: ICD Generator Changeout;  Surgeon: Elspeth JAYSON Sage, MD;  Location: [facility] INVASIVE CV LAB;  Service: Cardiovascular;  Laterality: N/A;   ESOPHAGOGASTRODUODENOSCOPY     EYE SURGERY  Cataract 2019   INSERT / REPLACE / REMOVE PACEMAKER     JOINT REPLACEMENT  2024   KNEE SURGERY     bilateral knee    SPINE SURGERY  1991   [name] WITHOUT CARDIOVERSION N/A 05/24/2016   Procedure: TRANSESOPHAGEAL ECHOCARDIOGRAM ([name]);  Surgeon: Dorise MARLA Fellers, MD;  Location: [facility] OR;  Service: Open Heart Surgery;  Laterality: N/A;   Ulnar Nerve surgery Left 09/19/2022   VASECTOMY      Family Psychiatric History: Denies  Family History:  Family History  Problem Relation Age of Onset   Heart attack Mother 34   Hypertension Mother    Melanoma Mother    Arthritis Mother    Heart attack Father 58   Hypertension Father    Hypertension Maternal Uncle    Heart disease Maternal Uncle    Heart disease Maternal Grandmother    Stroke Maternal Grandmother    Diabetes Neg Hx     Social History:   Social History   Socioeconomic History   Marital status: Married    Spouse  name: Jianni Batten   Number of children: 2   Years of education: Not on file  Highest education level: Not on file  Occupational History   Not on file  Tobacco Use   Smoking status: Former    Current packs/day: 0.00    Average packs/day: 1 pack/day for 25.0 years (25.0 ttl pk-yrs)    Types: Cigarettes    Start date: 05/19/1993    Quit date: 05/19/2018    Years since quitting: 5.8   Smokeless tobacco: Never  Vaping Use   Vaping status: Never Used  Substance and Sexual Activity   Alcohol  use: No    Comment: rare   Drug use: Never   Sexual activity: Not Currently    Birth control/protection: None  Other Topics Concern   Not on file  Social History Narrative   Married.   Moved from [place] .   Disabled.   Social Drivers of Corporate investment banker Strain: Low Risk  (12/26/2023)   Overall Financial Resource Strain (CARDIA)    Difficulty of Paying Living Expenses: Not hard at all  Food Insecurity: No Food Insecurity (12/26/2023)   Hunger Vital Sign    Worried About Running Out of Food in the Last Year: Never true    Ran Out of Food in the Last Year: Never true  Transportation Needs: No Transportation Needs (12/26/2023)   PRAPARE - Administrator, Civil Service (Medical): No    Lack of Transportation (Non-Medical): No  Physical Activity: Inactive (12/26/2023)   Exercise Vital Sign    Days of Exercise per Week: 0 days    Minutes of Exercise per Session: 0 min  Stress: No Stress Concern Present (12/26/2023)   [employer] of Occupational Health - Occupational Stress Questionnaire    Feeling of Stress : Not at all  Social Connections: Socially Isolated (12/26/2023)   Social Connection and Isolation Panel    Frequency of Communication with Friends and Family: Never    Frequency of Social Gatherings with Friends and Family: Once a week    Attends Religious Services: Never    Database administrator or Organizations: No    Attends Banker  Meetings: Never    Marital Status: Married    Additional Social History: Lives with is wife of 38 years, and his 2 grown sons (28-32) live with him. His son's associate lives with them as well. Has a dog. Wife does not want to kick kids out, or to stop paying their bills. Relationship with his wife is good outside of that. He is retired on medical disability. Moved here around 10 years ago.   Allergies:   Allergies  Allergen Reactions   Naproxen Other (See Comments), Anxiety and Rash    Causes hyperactivity causes hyperactivity   Morphine  And Codeine Nausea And Vomiting    Morphine  only    Metabolic Disorder Labs: Lab Results  Component Value Date   HGBA1C 6.8 04/12/2023   MPG 171 09/07/2021   MPG 148.46 12/06/2020   No results found for: PROLACTIN Lab Results  Component Value Date   CHOL 109 08/15/2022   TRIG 364.0 (H) 08/15/2022   HDL 29.80 (L) 08/15/2022   CHOLHDL 4 08/15/2022   VLDL 72.8 (H) 08/15/2022   LDLCALC 65 07/22/2020   Lab Results  Component Value Date   TSH 1.92 11/14/2022    Therapeutic Level Labs: No results found for: LITHIUM No results found for: CBMZ No results found for: VALPROATE  Current Medications: Current Outpatient Medications  Medication Sig Dispense Refill   ramelteon (ROZEREM) 8 MG tablet Take 1 tablet (  8 mg total) by mouth at bedtime. 30 tablet 2   Suvorexant (BELSOMRA) 5 MG TABS Take 1 tablet (5 mg total) by mouth every evening. 30 tablet 2   aspirin  EC 81 MG tablet Take 81 mg by mouth daily.  90 tablet    clopidogrel  (PLAVIX ) 75 MG tablet Take 1 tablet (75 mg total) by mouth daily. 90 tablet 0   ezetimibe  (ZETIA ) 10 MG tablet Take 1 tablet (10 mg total) by mouth daily. For cholesterol. 90 tablet 0   furosemide  (LASIX ) 40 MG tablet Take 80 mg by mouth daily.     gemfibrozil  (LOPID ) 600 MG tablet TAKE 1 TABLET BY MOUTH DAILY FOR CHOLESTEROL 90 tablet 1   HYDROcodone -acetaminophen  (NORCO/VICODIN) 5-325 MG tablet Take 1 tablet  by mouth as needed.     insulin  regular human CONCENTRATED (HUMULIN R ) 500 UNIT/ML injection Inject 0-25 Units into the skin daily at 12 noon.     metoprolol  succinate (TOPROL -XL) 100 MG 24 hr tablet Take 200 mg by mouth daily.     nitroGLYCERIN  (NITROSTAT ) 0.4 MG SL tablet Place 1 tablet (0.4 mg total) under the tongue every 5 (five) minutes as needed for chest pain. 25 tablet 1   omega-3 acid ethyl esters (LOVAZA ) 1 g capsule Take 1 g by mouth 2 (two) times daily.     rosuvastatin  (CRESTOR ) 40 MG tablet Take 1 tablet (40 mg total) by mouth daily. 90 tablet 3   sacubitril -valsartan  (ENTRESTO ) 49-51 MG Take 1 tablet by mouth 2 (two) times daily.     Semaglutide , 2 MG/DOSE, (OZEMPIC , 2 MG/DOSE,) 8 MG/3ML SOPN Inject 2 mg into the skin every Friday.     sotalol (BETAPACE) 160 MG tablet Take 320 mg by mouth 2 (two) times daily.     spironolactone  (ALDACTONE ) 25 MG tablet Take 25 mg by mouth daily.     No current facility-administered medications for this visit.    Musculoskeletal: Strength & Muscle Tone: within normal limits Gait & Station: normal Patient leans: N/A  Psychiatric Specialty Exam:  Psychiatric Specialty Exam: There were no vitals taken for this visit.There is no height or weight on file to calculate BMI. Review of Systems  General Appearance: Fairly Groomed  Eye Contact:  Good  Speech:  Clear and Coherent  Volume:  Normal  Mood:  Euthymic  Affect:  Congruent  Thought Content: Logical   Suicidal Thoughts:  No  Homicidal Thoughts:  No  Thought Process:  Coherent  Orientation:  Full (Time, Place, and Person)    Memory: Immediate;   Fair  Judgment:  Fair  Insight:  Fair  Concentration:  Concentration: Fair  Recall:  not formally assessed   Fund of Knowledge: Fair  Language: Good  Psychomotor Activity:  Normal  Akathisia:  No  AIMS (if indicated): not done  Assets:  Communication Skills Desire for Improvement Financial Resources/Insurance Housing Transportation   ADL's:  Intact  Cognition: WNL  Sleep:  Poor    Screenings: GAD-7    Flowsheet Row [facility] Office Visit from 12/18/2022 in [facility] Visit from 11/14/2022 in [facility] Visit from 07/23/2019 in [facility] Visit from 07/08/2017 in [facility]  Total GAD-7 Score 2 0 0 12 19   PHQ2-9    Flowsheet Row Office Visit from 03/10/2024 in [facility]  Office Visit from 12/31/2023 in [facility] Clinical Support from 12/26/2023 in [facility] Office Visit from 12/18/2022 in [facility] Visit from 11/14/2022 in [facility]  PHQ-2 Total Score 2 0 0 3 5  PHQ-9 Total Score 7 0 -- 10 17   Flowsheet Row Office Visit from 03/10/2024 [facility] Admission (Discharged) from 10/06/2021 in [facility] to Hosp-Admission (Discharged) from 09/06/2021 in [facility]  C-SSRS RISK CATEGORY No Risk No Risk No Risk     Collaboration of Care: Medication Management AEB medication prescription , Primary Care Provider AEB chart review, and Other provider involved in patient's care AEB pulmonology and neurology chart  Patient/Guardian was advised Release of Information must be obtained prior to any record release in order to collaborate their care with an outside provider. Patient/Guardian was advised if they have not already done so to contact the registration department to sign all necessary forms in order for us  to release information regarding their care.   Consent: Patient/Guardian gives verbal  consent for treatment and assignment of benefits for services provided during this visit. Patient/Guardian expressed understanding and agreed to proceed.   Arvella CHRISTELLA Finder, MD 6/25/20254:18 PM

## 2024-03-10 ENCOUNTER — Ambulatory Visit (HOSPITAL_BASED_OUTPATIENT_CLINIC_OR_DEPARTMENT_OTHER): Payer: Self-pay | Admitting: Psychiatry

## 2024-03-10 DIAGNOSIS — G4733 Obstructive sleep apnea (adult) (pediatric): Secondary | ICD-10-CM

## 2024-03-10 DIAGNOSIS — F5101 Primary insomnia: Secondary | ICD-10-CM

## 2024-03-10 MED ORDER — DOXEPIN HCL 10 MG PO CAPS: 10.0000 mg | ORAL_CAPSULE | Freq: Every day | ORAL | 2 refills | Status: DC

## 2024-03-10 MED ORDER — RAMELTEON 8 MG PO TABS: 8.0000 mg | ORAL_TABLET | Freq: Every day | ORAL | 2 refills | Status: DC

## 2024-03-10 MED ORDER — BELSOMRA 5 MG PO TABS: 5.0000 mg | ORAL_TABLET | Freq: Every evening | ORAL | 2 refills | Status: DC

## 2024-03-11 ENCOUNTER — Encounter (HOSPITAL_COMMUNITY): Payer: Self-pay | Admitting: Psychiatry

## 2024-03-12 DIAGNOSIS — Z794 Long term (current) use of insulin: Secondary | ICD-10-CM | POA: Diagnosis not present

## 2024-03-12 DIAGNOSIS — E118 Type 2 diabetes mellitus with unspecified complications: Secondary | ICD-10-CM | POA: Diagnosis not present

## 2024-03-14 DIAGNOSIS — E118 Type 2 diabetes mellitus with unspecified complications: Secondary | ICD-10-CM | POA: Diagnosis not present

## 2024-03-14 DIAGNOSIS — Z794 Long term (current) use of insulin: Secondary | ICD-10-CM | POA: Diagnosis not present

## 2024-03-19 DIAGNOSIS — E118 Type 2 diabetes mellitus with unspecified complications: Secondary | ICD-10-CM | POA: Diagnosis not present

## 2024-03-19 DIAGNOSIS — Z794 Long term (current) use of insulin: Secondary | ICD-10-CM | POA: Diagnosis not present

## 2024-03-24 ENCOUNTER — Encounter (HOSPITAL_COMMUNITY): Payer: Self-pay

## 2024-03-25 DIAGNOSIS — E1142 Type 2 diabetes mellitus with diabetic polyneuropathy: Secondary | ICD-10-CM | POA: Diagnosis not present

## 2024-03-25 DIAGNOSIS — Z720 Tobacco use: Secondary | ICD-10-CM | POA: Diagnosis not present

## 2024-03-25 DIAGNOSIS — Z794 Long term (current) use of insulin: Secondary | ICD-10-CM | POA: Diagnosis not present

## 2024-03-25 DIAGNOSIS — I5022 Chronic systolic (congestive) heart failure: Secondary | ICD-10-CM | POA: Diagnosis not present

## 2024-03-25 DIAGNOSIS — E669 Obesity, unspecified: Secondary | ICD-10-CM | POA: Diagnosis not present

## 2024-03-25 DIAGNOSIS — Z95810 Presence of automatic (implantable) cardiac defibrillator: Secondary | ICD-10-CM | POA: Diagnosis not present

## 2024-03-25 DIAGNOSIS — I10 Essential (primary) hypertension: Secondary | ICD-10-CM | POA: Diagnosis not present

## 2024-03-25 DIAGNOSIS — I255 Ischemic cardiomyopathy: Secondary | ICD-10-CM | POA: Diagnosis not present

## 2024-03-25 DIAGNOSIS — G4733 Obstructive sleep apnea (adult) (pediatric): Secondary | ICD-10-CM | POA: Diagnosis not present

## 2024-03-25 DIAGNOSIS — I472 Ventricular tachycardia, unspecified: Secondary | ICD-10-CM | POA: Diagnosis not present

## 2024-03-25 DIAGNOSIS — I2510 Atherosclerotic heart disease of native coronary artery without angina pectoris: Secondary | ICD-10-CM | POA: Diagnosis not present

## 2024-03-25 NOTE — Telephone Encounter (Signed)
Called patient back and left a voicemail.

## 2024-03-26 ENCOUNTER — Encounter: Payer: Self-pay | Admitting: Sleep Medicine

## 2024-03-26 ENCOUNTER — Ambulatory Visit: Admitting: Student in an Organized Health Care Education/Training Program

## 2024-03-26 DIAGNOSIS — Z794 Long term (current) use of insulin: Secondary | ICD-10-CM | POA: Diagnosis not present

## 2024-03-26 DIAGNOSIS — E118 Type 2 diabetes mellitus with unspecified complications: Secondary | ICD-10-CM | POA: Diagnosis not present

## 2024-03-26 NOTE — Telephone Encounter (Signed)
 Okay to change pressure back to previous settings?

## 2024-03-30 DIAGNOSIS — I255 Ischemic cardiomyopathy: Secondary | ICD-10-CM | POA: Diagnosis not present

## 2024-03-30 DIAGNOSIS — I480 Paroxysmal atrial fibrillation: Secondary | ICD-10-CM | POA: Diagnosis not present

## 2024-03-30 DIAGNOSIS — R9431 Abnormal electrocardiogram [ECG] [EKG]: Secondary | ICD-10-CM | POA: Diagnosis not present

## 2024-03-30 DIAGNOSIS — I472 Ventricular tachycardia, unspecified: Secondary | ICD-10-CM | POA: Diagnosis not present

## 2024-03-30 DIAGNOSIS — R0600 Dyspnea, unspecified: Secondary | ICD-10-CM | POA: Diagnosis not present

## 2024-03-30 DIAGNOSIS — R931 Abnormal findings on diagnostic imaging of heart and coronary circulation: Secondary | ICD-10-CM | POA: Diagnosis not present

## 2024-03-30 DIAGNOSIS — Z95810 Presence of automatic (implantable) cardiac defibrillator: Secondary | ICD-10-CM | POA: Diagnosis not present

## 2024-04-02 DIAGNOSIS — E118 Type 2 diabetes mellitus with unspecified complications: Secondary | ICD-10-CM | POA: Diagnosis not present

## 2024-04-02 DIAGNOSIS — Z794 Long term (current) use of insulin: Secondary | ICD-10-CM | POA: Diagnosis not present

## 2024-04-03 ENCOUNTER — Encounter: Payer: Self-pay | Admitting: Cardiovascular Disease

## 2024-04-03 ENCOUNTER — Ambulatory Visit: Attending: Cardiovascular Disease | Admitting: Cardiovascular Disease

## 2024-04-03 VITALS — BP 80/50 | HR 92 | Ht 71.0 in | Wt 237.2 lb

## 2024-04-03 DIAGNOSIS — Z95810 Presence of automatic (implantable) cardiac defibrillator: Secondary | ICD-10-CM

## 2024-04-03 DIAGNOSIS — I5022 Chronic systolic (congestive) heart failure: Secondary | ICD-10-CM

## 2024-04-03 DIAGNOSIS — I25118 Atherosclerotic heart disease of native coronary artery with other forms of angina pectoris: Secondary | ICD-10-CM

## 2024-04-03 DIAGNOSIS — Z951 Presence of aortocoronary bypass graft: Secondary | ICD-10-CM

## 2024-04-03 DIAGNOSIS — I442 Atrioventricular block, complete: Secondary | ICD-10-CM

## 2024-04-03 DIAGNOSIS — I4729 Other ventricular tachycardia: Secondary | ICD-10-CM | POA: Diagnosis not present

## 2024-04-03 DIAGNOSIS — I10 Essential (primary) hypertension: Secondary | ICD-10-CM

## 2024-04-03 DIAGNOSIS — I255 Ischemic cardiomyopathy: Secondary | ICD-10-CM

## 2024-04-03 DIAGNOSIS — E782 Mixed hyperlipidemia: Secondary | ICD-10-CM

## 2024-04-03 DIAGNOSIS — I493 Ventricular premature depolarization: Secondary | ICD-10-CM

## 2024-04-03 NOTE — Progress Notes (Signed)
 Cardiology Office Note  Date:  04/03/2024   ID:  RAJ LANDRESS, DOB 11/25/1960, MRN [id number]  PCP:  Gretta Comer POUR, NP   Chief Complaint  Patient presents with   12 month follow up     Patient c/o shortness of breath with little to no exertion. Patient had a cardiac ablation, cardiac cath and ICD change out at [facility] within the past couple of months.     HPI:  Mr. Bushong is a pleasant 63 year old gentleman with history of  coronary artery disease, hx of CABG long history of smoking,  diabetes type 2 poorly controlled with  hemoglobin A1c of 7.8,  cardiac catheterization 12 dating back to 2002,  ischemic cardiomyopathy,  echocardiogram March 2016 showing ejection fraction 20-25%,   sustained VT, VT ablation in [place] at [facility] sleep apnea, on CPAP, generator change in 2016, ICD in place,   previously seen by seen by [facility] cardiology heart failure/transplant team, [name].  EF 35% in 2017 cath 06/2019 EF 20 to 25% in January 2022 Echo May 2023 EF 25 to 30% Echo July 2025 EF 25% who presents for routine follow-up of his coronary artery disease, CABG, September 2017  seen by myself in clinic 4/24  followed by heart failure clinic at [facility],   December 04, 2023: Ventricular tachycardia ablation in ischemic cardiomyopathy   Cardiac catheterization Feb 12, 2024 3-vessel coronary artery disease including 60% stenosis of distal LMCA,  complete occlusion of the mLAD & mRCA and 80 % stenosis of OM2 & OM3.  Patent LIMA-mLAD, VG-rPDA and VG-OM2. VG-Diag is known to be occluded   Echo performed at [facility] April 01, 2024  Ejection fraction estimated 25%, normal RV size and function, unchanged from 2016  No energy, reports feeling lousy at times Entresto  cut in 1/2 BID for low blood pressure and low energy  Weight down 30 pounds in past year On GLP-1 agonist Ozempic   Sedentary, declining cardiac rehab Chronic arthritic pain, knees and hand pain  Other past medical history  reviewed Farxiga /jardiance: yeast infection in groin  CABG By Dr. Lucas September 2017 Left internal mammary graft to the LAD SVG to diagonal, SVG to OM, SVG to PDA  cath on 05/10/2016 showing a 70% eccentric distal LM stenosis, 80% proximal LAD in-stent restenosis, 80% diagonal stenosis, and tandem 90% proximal and mid RCA stenoses with mild in-stent narrowing. The LVEF is visually 35-45%   Previously seen in [place] for his cardiac issues. History dates back to 2002 when he had distal RCA disease. Several catheterizations over the next several years for in-stent restenosis of the distal RCA. Later cath, stent placed to the LAD. Most recent catheterization appears to be April 2011 showing patent LAD and RCA stent.  PMH:   has a past medical history of Acute appendicitis (04/03/2021), Acute pyelonephritis (09/07/2021), AICD (automatic cardioverter/defibrillator) present, Allergy, Anxiety, Appendicitis, Arthritis, Back pain, Bacteremia due to Klebsiella pneumoniae (09/07/2021), Cardiomyopathy (HCC), CHF (congestive heart failure) (HCC), Coronary artery disease, Depression, Diabetes mellitus without complication (HCC), GERD (gastroesophageal reflux disease), Headache, History of bronchitis, History of colon polyps, History of hiatal hernia, Hyperlipidemia, Hypertension, MI (myocardial infarction) (HCC) (2001), Multifocal pneumonia (09/07/2021), Obesity, Peripheral neuropathy, Persistent cough for 3 weeks or longer (07/19/2022), Pneumonia, PONV (postoperative nausea and vomiting), Presence of permanent cardiac pacemaker, Sepsis (HCC) (09/06/2021), Shortness of breath dyspnea, Sleep apnea, and Ventricular tachycardia (HCC).  PSH:    Past Surgical History:  Procedure Laterality Date   APPENDECTOMY     BACK SURGERY  CARDIAC CATHETERIZATION     CARDIAC CATHETERIZATION Left 05/10/2016   Procedure: Left Heart Cath and Coronary Angiography;  Surgeon: Evalene JINNY Lunger, MD;  Location: [facility]  INVASIVE CV LAB;  Service: Cardiovascular;  Laterality: Left;   CARDIAC DEFIBRILLATOR PLACEMENT  10/16/2007   ICD Model number 2207-36 serial number [id number]   CARDIAC ELECTROPHYSIOLOGY STUDY AND ABLATION     CHOLECYSTECTOMY  2001   COLONOSCOPY     COLONOSCOPY WITH PROPOFOL  N/A 08/28/2018   Procedure: COLONOSCOPY WITH PROPOFOL ;  Surgeon: Therisa Bi, MD;  Location: [facility] ENDOSCOPY;  Service: Gastroenterology;  Laterality: N/A;   COLONOSCOPY WITH PROPOFOL  N/A 10/06/2021   Procedure: COLONOSCOPY WITH PROPOFOL ;  Surgeon: Therisa Bi, MD;  Location: [facility] ENDOSCOPY;  Service: Gastroenterology;  Laterality: N/A;   CORONARY ANGIOPLASTY WITH STENT PLACEMENT     7 stents   CORONARY ARTERY BYPASS GRAFT N/A 05/24/2016   Procedure: CORONARY ARTERY BYPASS GRAFTING (CABG) x four , using left internal mammary artery and left leg greater saphenous vein harvested endoscopically;  Surgeon: Dorise MARLA Fellers, MD;  [facility]: [facility] OR;  Service: Open Heart Surgery;  Laterality: N/A;   EP IMPLANTABLE DEVICE N/A 06/16/2015   Procedure: ICD Generator Changeout;  Surgeon: Elspeth JAYSON Sage, MD;  Location: [facility] INVASIVE CV LAB;  Service: Cardiovascular;  Laterality: N/A;   ESOPHAGOGASTRODUODENOSCOPY     EYE SURGERY  Cataract 2019   INSERT / REPLACE / REMOVE PACEMAKER     JOINT REPLACEMENT  2024   KNEE SURGERY     bilateral knee    SPINE SURGERY  1991   [name] WITHOUT CARDIOVERSION N/A 05/24/2016   Procedure: TRANSESOPHAGEAL ECHOCARDIOGRAM ([name]);  Surgeon: Dorise MARLA Fellers, MD;  Location: [facility] OR;  Service: Open Heart Surgery;  Laterality: N/A;   Ulnar Nerve surgery Left 09/19/2022   VASECTOMY      Current Outpatient Medications  Medication Sig Dispense Refill   aspirin  EC 81 MG tablet Take 81 mg by mouth daily.  90 tablet    clopidogrel  (PLAVIX ) 75 MG tablet Take 1 tablet (75 mg total) by mouth daily. 90 tablet 0   ezetimibe  (ZETIA ) 10 MG tablet Take 1 tablet (10 mg total) by mouth daily. For cholesterol. 90 tablet 0    furosemide  (LASIX ) 40 MG tablet Take 80 mg by mouth daily.     gemfibrozil  (LOPID ) 600 MG tablet TAKE 1 TABLET BY MOUTH DAILY FOR CHOLESTEROL 90 tablet 1   HYDROcodone -acetaminophen  (NORCO/VICODIN) 5-325 MG tablet Take 1 tablet by mouth as needed.     insulin  regular human CONCENTRATED (HUMULIN R ) 500 UNIT/ML injection Inject 0-25 Units into the skin daily at 12 noon.     metoprolol  succinate (TOPROL -XL) 100 MG 24 hr tablet Take 200 mg by mouth daily.     nitroGLYCERIN  (NITROSTAT ) 0.4 MG SL tablet Place 1 tablet (0.4 mg total) under the tongue every 5 (five) minutes as needed for chest pain. 25 tablet 1   omega-3 acid ethyl esters (LOVAZA ) 1 g capsule Take 1 g by mouth 2 (two) times daily.     ramelteon  (ROZEREM ) 8 MG tablet Take 1 tablet (8 mg total) by mouth at bedtime. 30 tablet 2   rosuvastatin  (CRESTOR ) 40 MG tablet Take 1 tablet (40 mg total) by mouth daily. 90 tablet 3   sacubitril -valsartan  (ENTRESTO ) 49-51 MG Take 0.5 tablets by mouth 2 (two) times daily.     Semaglutide , 2 MG/DOSE, (OZEMPIC , 2 MG/DOSE,) 8 MG/3ML SOPN Inject 2 mg into the skin every Friday.     sotalol (  BETAPACE) 160 MG tablet Take 320 mg by mouth 2 (two) times daily.     spironolactone  (ALDACTONE ) 25 MG tablet Take 25 mg by mouth daily.     Suvorexant  (BELSOMRA ) 5 MG TABS Take 1 tablet (5 mg total) by mouth every evening. 30 tablet 2   No current facility-administered medications for this visit.   Allergies:   Naproxen and Morphine  and codeine   Social History:  The patient  reports that he quit smoking about 5 years ago. His smoking use included cigarettes. He started smoking about 30 years ago. He has a 25 pack-year smoking history. He has never used smokeless tobacco. He reports that he does not drink alcohol  and does not use drugs.   Family History:   family history includes Arthritis in his mother; Heart attack (age of onset: 68) in his mother; Heart attack (age of onset: 71) in his father; Heart disease in his  maternal grandmother and maternal uncle; Hypertension in his father, maternal uncle, and mother; Melanoma in his mother; Stroke in his maternal grandmother.   Review of Systems: Review of Systems  Constitutional: Negative.   HENT: Negative.    Respiratory:  Positive for shortness of breath.   Cardiovascular: Negative.   Gastrointestinal: Negative.   Musculoskeletal:  Positive for joint pain.  Neurological: Negative.   Psychiatric/Behavioral: Negative.    All other systems reviewed and are negative.  PHYSICAL EXAM: VS:  BP (!) 80/50 (BP Location: Right Arm, Patient Position: Sitting, Cuff Size: Normal)   Pulse 92   Ht 5' 11 (1.803 m)   Wt 237 lb 4 oz (107.6 kg)   SpO2 98%   BMI 33.09 kg/m  , BMI Body mass index is 33.09 kg/m. Constitutional:  oriented to person, place, and time. No distress.  HENT:  Head: Grossly normal Eyes:  no discharge. No scleral icterus.  Neck: No JVD, no carotid bruits  Cardiovascular: Regular rate and rhythm, no murmurs appreciated Pulmonary/Chest: Clear to auscultation bilaterally, no wheezes or rales Abdominal: Soft.  no distension.  no tenderness.  Musculoskeletal: Normal range of motion Neurological:  normal muscle tone. Coordination normal. No atrophy Skin: Skin warm and dry Psychiatric: normal affect, pleasant   Recent Labs: No results found for requested labs within last 365 days.    Lipid Panel Lab Results  Component Value Date   CHOL 109 08/15/2022   HDL 29.80 (L) 08/15/2022   LDLCALC 65 07/22/2020   TRIG 364.0 (H) 08/15/2022      Wt Readings from Last 3 Encounters:  04/03/24 237 lb 4 oz (107.6 kg)  03/05/24 236 lb 3.2 oz (107.1 kg)  12/31/23 242 lb (109.8 kg)     ASSESSMENT AND PLAN:  Congestive dilated cardiomyopathy (HCC) -  Chronically depressed ejection fraction estimated 25% dating back 9 years or more Followed by heart failure clinic at [facility] Recent decrease in Entresto  dosing for low energy, hypotension Compliant  with his medications as detailed above  Chronic systolic heart failure (HCC) -  Ejection fraction 25% per outside records Continue Lasix  80 daily, spironolactone  Appears euvolemic GDMT limited secondary to hypotension  Chronic insomnia Reports that he no longer takes Ramelteon   Stable angina (HCC) Currently with no symptoms of angina. No further workup at this time. Continue current medication regimen. Recent cardiac catheterization performed, results reviewed  Morbid obesity with BMI of 40.0-44.9, adult (HCC) Dramatic weight loss over the past year to 30 pounds on Ozempic  and through dietary changes  Hx of CABG Chronic stable anginal  symptoms Recent catheterization results discussed with him  OSA on CPAP on CPAP  Pure hypercholesterolemia On Crestor   40 and Zetia   Active smoker Cessation recommended  Diabetes type 2 with complications A1c trending downward on Ozempic , weight loss  ICD ICD change out, upgrade, 3 wire Follow[facility] EP [facility]  Poor dentition Stressed the importance of close follow-up with dentistry Reports work that he needs to have done will cost him several thousand dollars Discussed risk of infection, endocarditis   Orders Placed This Encounter  Procedures   EKG 12-Lead     Signed, Velinda Lunger, M.D., Ph.D. 04/03/2024  [facility] [place], [place] [phone]

## 2024-04-03 NOTE — Patient Instructions (Signed)

## 2024-04-07 DIAGNOSIS — E118 Type 2 diabetes mellitus with unspecified complications: Secondary | ICD-10-CM | POA: Diagnosis not present

## 2024-04-07 DIAGNOSIS — Z95810 Presence of automatic (implantable) cardiac defibrillator: Secondary | ICD-10-CM | POA: Diagnosis not present

## 2024-04-09 DIAGNOSIS — E118 Type 2 diabetes mellitus with unspecified complications: Secondary | ICD-10-CM | POA: Diagnosis not present

## 2024-04-09 DIAGNOSIS — Z794 Long term (current) use of insulin: Secondary | ICD-10-CM | POA: Diagnosis not present

## 2024-04-10 ENCOUNTER — Encounter: Payer: Self-pay | Admitting: Primary Care

## 2024-04-10 ENCOUNTER — Ambulatory Visit (INDEPENDENT_AMBULATORY_CARE_PROVIDER_SITE_OTHER)
Admission: RE | Admit: 2024-04-10 | Discharge: 2024-04-10 | Disposition: A | Discharge status: Home / Self Care | Source: Ambulatory Visit | Attending: Primary Care | Admitting: Primary Care

## 2024-04-10 ENCOUNTER — Ambulatory Visit (INDEPENDENT_AMBULATORY_CARE_PROVIDER_SITE_OTHER): Admitting: Primary Care

## 2024-04-10 VITALS — BP 102/56 | HR 73 | Temp 97.2°F | Ht 71.0 in | Wt 241.0 lb

## 2024-04-10 DIAGNOSIS — M79641 Pain in right hand: Secondary | ICD-10-CM | POA: Diagnosis not present

## 2024-04-10 DIAGNOSIS — M79642 Pain in left hand: Secondary | ICD-10-CM

## 2024-04-10 DIAGNOSIS — M2540 Effusion, unspecified joint: Secondary | ICD-10-CM | POA: Diagnosis not present

## 2024-04-10 DIAGNOSIS — M7989 Other specified soft tissue disorders: Secondary | ICD-10-CM | POA: Diagnosis not present

## 2024-04-10 DIAGNOSIS — G8929 Other chronic pain: Secondary | ICD-10-CM | POA: Insufficient documentation

## 2024-04-10 LAB — C-REACTIVE PROTEIN: CRP: <1 mg/dL (ref 0.5–20.0)

## 2024-04-10 LAB — SEDIMENTATION RATE: Sed Rate: 14 mm/h (ref 0–20)

## 2024-04-10 NOTE — Patient Instructions (Signed)
Stop by the lab and xray prior to leaving today. I will notify you of your results once received.   It was a pleasure to see you today!   

## 2024-04-10 NOTE — Progress Notes (Signed)
 Subjective:    Patient ID: Brian Williams, male    DOB: 1961/06/09, 63 y.o.   MRN: [id number]  HPI  Brian Williams is a very pleasant 63 y.o. male with history of CHF, type 2 diabetes, CAD, OSA, chronic diarrhea, polyneuropathy, chronic back pain, lumbar degenerative disease, lower extremity pain, chronic SI joint pain, gout who presents today to discuss joint pain.  He is requesting a referral to rheumatology for chronic joint pain to his hands bilaterally.  His pain is located to the DIP and PIP joints of all fingers to bilaterally hands which began about 8 weeks ago. He has right PIP joint swelling, cannot make a complete fist with his right hand.   He denies erythema, warmth.   He is following with orthopedics for [facility] clinic and is managed on hydrocodone  acetaminophen  5-325 mg every 8 hours for osteoarthritis of the left knee.  He does not ever recall undergoing testing for rheumatoid arthritis.   Review of Systems  Constitutional:  Negative for fever.  Musculoskeletal:  Positive for arthralgias and joint swelling.  Skin:  Negative for color change.         Past Medical History:  Diagnosis Date   Acute appendicitis 04/03/2021   Acute pyelonephritis 09/07/2021   AICD (automatic cardioverter/defibrillator) present    Allergy    Anxiety    takes Xanax  as needed   Appendicitis    Arthritis    back,knees,right shoulder   Back pain    Bacteremia due to Klebsiella pneumoniae 09/07/2021   Cardiomyopathy (HCC)    CHF (congestive heart failure) (HCC)    takes Lasix  and Aldactone  daily   Coronary artery disease    takes Plavix  daily   Depression    takes Zoloft  daily   Diabetes mellitus without complication (HCC)    Humulin R  and Farxiga  daily.Fasting blood sugar runs 140   GERD (gastroesophageal reflux disease)    Headache    History of bronchitis    History of colon polyps    benign   History of hiatal hernia    Hyperlipidemia    takes Fenofibrate ,Crestor , and  Zetia  daily   Hypertension    takes Entresto  and Coreg  daily   MI (myocardial infarction) (HCC) 2001   Multifocal pneumonia 09/07/2021   Obesity    Peripheral neuropathy    Persistent cough for 3 weeks or longer 07/19/2022   Pneumonia    history of-last time about 14 yrs ago   PONV (postoperative nausea and vomiting)    after knee surgery 25 yrs ago b/p stayed elevated for a while   Presence of permanent cardiac pacemaker    Sepsis (HCC) 09/06/2021   Shortness of breath dyspnea    with exertion   Sleep apnea    uses CPAP nightly   Ventricular tachycardia (HCC)    s/p RFCA PVCs 2013    Social History   Socioeconomic History   Marital status: Married    Spouse name: Endre Coutts   Number of children: 2   Years of education: Not on file   Highest education level: Not on file  Occupational History   Not on file  Tobacco Use   Smoking status: Former    Current packs/day: 0.00    Average packs/day: 1 pack/day for 25.0 years (25.0 ttl pk-yrs)    Types: Cigarettes    Start date: 05/19/1993    Quit date: 05/19/2018    Years since quitting: 5.8   Smokeless tobacco: Never  Vaping Use   Vaping status: Never Used  Substance and Sexual Activity   Alcohol  use: No    Comment: rare   Drug use: Never   Sexual activity: Not Currently    Birth control/protection: None  Other Topics Concern   Not on file  Social History Narrative   Married.   Moved from [place] .   Disabled.   Social Drivers of Corporate investment banker Strain: Low Risk  (12/26/2023)   Overall Financial Resource Strain (CARDIA)    Difficulty of Paying Living Expenses: Not hard at all  Food Insecurity: No Food Insecurity (12/26/2023)   Hunger Vital Sign    Worried About Running Out of Food in the Last Year: Never true    Ran Out of Food in the Last Year: Never true  Transportation Needs: No Transportation Needs (12/26/2023)   PRAPARE - Administrator, Civil Service (Medical): No    Lack of  Transportation (Non-Medical): No  Physical Activity: Inactive (12/26/2023)   Exercise Vital Sign    Days of Exercise per Week: 0 days    Minutes of Exercise per Session: 0 min  Stress: No Stress Concern Present (12/26/2023)   [employer] of Occupational Health - Occupational Stress Questionnaire    Feeling of Stress : Not at all  Social Connections: Socially Isolated (12/26/2023)   Social Connection and Isolation Panel    Frequency of Communication with Friends and Family: Never    Frequency of Social Gatherings with Friends and Family: Once a week    Attends Religious Services: Never    Database administrator or Organizations: No    Attends Banker Meetings: Never    Marital Status: Married  Catering manager Violence: Not At Risk (12/26/2023)   Humiliation, Afraid, Rape, and Kick questionnaire    Fear of Current or Ex-Partner: No    Emotionally Abused: No    Physically Abused: No    Sexually Abused: No    Past Surgical History:  Procedure Laterality Date   APPENDECTOMY     BACK SURGERY     CARDIAC CATHETERIZATION     CARDIAC CATHETERIZATION Left 05/10/2016   Procedure: Left Heart Cath and Coronary Angiography;  Surgeon: Evalene JINNY Lunger, MD;  Location: [facility] INVASIVE CV LAB;  Service: Cardiovascular;  Laterality: Left;   CARDIAC DEFIBRILLATOR PLACEMENT  10/16/2007   ICD Model number 2207-36 serial number [id number]   CARDIAC ELECTROPHYSIOLOGY STUDY AND ABLATION     CHOLECYSTECTOMY  2001   COLONOSCOPY     COLONOSCOPY WITH PROPOFOL  N/A 08/28/2018   Procedure: COLONOSCOPY WITH PROPOFOL ;  Surgeon: Therisa Bi, MD;  Location: [facility] ENDOSCOPY;  Service: Gastroenterology;  Laterality: N/A;   COLONOSCOPY WITH PROPOFOL  N/A 10/06/2021   Procedure: COLONOSCOPY WITH PROPOFOL ;  Surgeon: Therisa Bi, MD;  Location: [facility] ENDOSCOPY;  Service: Gastroenterology;  Laterality: N/A;   CORONARY ANGIOPLASTY WITH STENT PLACEMENT     7 stents   CORONARY ARTERY BYPASS GRAFT N/A 05/24/2016    Procedure: CORONARY ARTERY BYPASS GRAFTING (CABG) x four , using left internal mammary artery and left leg greater saphenous vein harvested endoscopically;  Surgeon: Dorise MARLA Fellers, MD;  Location: [facility] OR;  Service: Open Heart Surgery;  Laterality: N/A;   EP IMPLANTABLE DEVICE N/A 06/16/2015   Procedure: ICD Generator Changeout;  Surgeon: Elspeth JAYSON Sage, MD;  Location: [facility] INVASIVE CV LAB;  Service: Cardiovascular;  Laterality: N/A;   ESOPHAGOGASTRODUODENOSCOPY     EYE SURGERY  Cataract 2019   INSERT /  REPLACE / REMOVE PACEMAKER     JOINT REPLACEMENT  2024   KNEE SURGERY     bilateral knee    SPINE SURGERY  1991   [name] WITHOUT CARDIOVERSION N/A 05/24/2016   Procedure: TRANSESOPHAGEAL ECHOCARDIOGRAM ([name]);  Surgeon: Dorise MARLA Fellers, MD;  Location: [facility] OR;  Service: Open Heart Surgery;  Laterality: N/A;   Ulnar Nerve surgery Left 09/19/2022   VASECTOMY      Family History  Problem Relation Age of Onset   Heart attack Mother 39   Hypertension Mother    Melanoma Mother    Arthritis Mother    Heart attack Father 84   Hypertension Father    Hypertension Maternal Uncle    Heart disease Maternal Uncle    Heart disease Maternal Grandmother    Stroke Maternal Grandmother    Diabetes Neg Hx     Allergies  Allergen Reactions   Naproxen Other (See Comments), Anxiety and Rash    Causes hyperactivity causes hyperactivity   Morphine  And Codeine Nausea And Vomiting    Morphine  only    Current Outpatient Medications on File Prior to Visit  Medication Sig Dispense Refill   aspirin  EC 81 MG tablet Take 81 mg by mouth daily.  90 tablet    clopidogrel  (PLAVIX ) 75 MG tablet Take 1 tablet (75 mg total) by mouth daily. 90 tablet 0   ezetimibe  (ZETIA ) 10 MG tablet Take 1 tablet (10 mg total) by mouth daily. For cholesterol. 90 tablet 0   furosemide  (LASIX ) 40 MG tablet Take 80 mg by mouth daily.     gemfibrozil  (LOPID ) 600 MG tablet TAKE 1 TABLET BY MOUTH DAILY FOR CHOLESTEROL 90 tablet 1    HYDROcodone -acetaminophen  (NORCO/VICODIN) 5-325 MG tablet Take 1 tablet by mouth as needed.     insulin  regular human CONCENTRATED (HUMULIN R ) 500 UNIT/ML injection Inject 0-25 Units into the skin daily at 12 noon.     metoprolol  succinate (TOPROL -XL) 100 MG 24 hr tablet Take 200 mg by mouth daily.     nitroGLYCERIN  (NITROSTAT ) 0.4 MG SL tablet Place 1 tablet (0.4 mg total) under the tongue every 5 (five) minutes as needed for chest pain. 25 tablet 1   omega-3 acid ethyl esters (LOVAZA ) 1 g capsule Take 1 g by mouth 2 (two) times daily.     rosuvastatin  (CRESTOR ) 40 MG tablet Take 1 tablet (40 mg total) by mouth daily. 90 tablet 3   sacubitril -valsartan  (ENTRESTO ) 49-51 MG Take 0.5 tablets by mouth 2 (two) times daily.     Semaglutide , 2 MG/DOSE, (OZEMPIC , 2 MG/DOSE,) 8 MG/3ML SOPN Inject 2 mg into the skin every Friday.     sotalol (BETAPACE) 160 MG tablet Take 320 mg by mouth 2 (two) times daily.     spironolactone  (ALDACTONE ) 25 MG tablet Take 25 mg by mouth daily.     ramelteon  (ROZEREM ) 8 MG tablet Take 1 tablet (8 mg total) by mouth at bedtime. (Patient not taking: Reported on 04/10/2024) 30 tablet 2   Suvorexant  (BELSOMRA ) 5 MG TABS Take 1 tablet (5 mg total) by mouth every evening. (Patient not taking: Reported on 04/10/2024) 30 tablet 2   No current facility-administered medications on file prior to visit.    BP (!) 102/56   Pulse 73   Temp (!) 97.2 F (36.2 C) (Temporal)   Ht 5' 11 (1.803 m)   Wt 241 lb (109.3 kg)   SpO2 97%   BMI 33.61 kg/m  Objective:   Physical Exam Cardiovascular:  Rate and Rhythm: Normal rate.  Pulmonary:     Effort: Pulmonary effort is normal.  Musculoskeletal:     Right hand: No bony tenderness. Decreased range of motion.     Comments: Swelling to PIP joints of right hand which is predominantly seen to the right third digit.  Unable to make a fist with right hand.  Skin:    General: Skin is warm and dry.     Findings: No erythema.   Neurological:     Mental Status: He is alert.           Assessment & Plan:  Pain in both hands Assessment & Plan: Evidence of osteoarthritis noted bilaterally, right greater than left.  Will check lab work today to rule out autoimmune arthritis including CCP, RF, sed rate, CRP. Consider referral to rheumatology.  Await results.  Orders: -     DG Hand Complete Right -     Cyclic citrul peptide antibody, IgG -     Rheumatoid factor -     Sedimentation rate -     C-reactive protein  Joint swelling -     DG Hand Complete Right -     Cyclic citrul peptide antibody, IgG -     Rheumatoid factor -     Sedimentation rate -     C-reactive protein        Comer MARLA Gaskins, NP

## 2024-04-10 NOTE — Assessment & Plan Note (Signed)
 Evidence of osteoarthritis noted bilaterally, right greater than left.  Will check lab work today to rule out autoimmune arthritis including CCP, RF, sed rate, CRP. Consider referral to rheumatology.  Await results.

## 2024-04-12 ENCOUNTER — Ambulatory Visit: Payer: Self-pay | Admitting: Primary Care

## 2024-04-12 LAB — RHEUMATOID FACTOR: Rheumatoid fact SerPl-aCnc: <10 [IU]/mL (ref <14)

## 2024-04-12 LAB — CYCLIC CITRUL PEPTIDE ANTIBODY, IGG: Cyclic Citrullin Peptide Ab: <16 U

## 2024-04-16 DIAGNOSIS — Z794 Long term (current) use of insulin: Secondary | ICD-10-CM | POA: Diagnosis not present

## 2024-04-16 DIAGNOSIS — E118 Type 2 diabetes mellitus with unspecified complications: Secondary | ICD-10-CM | POA: Diagnosis not present

## 2024-04-16 DIAGNOSIS — K080 Exfoliation of teeth due to systemic causes: Secondary | ICD-10-CM | POA: Diagnosis not present

## 2024-04-23 DIAGNOSIS — I5042 Chronic combined systolic (congestive) and diastolic (congestive) heart failure: Secondary | ICD-10-CM | POA: Diagnosis not present

## 2024-04-23 DIAGNOSIS — Z4502 Encounter for adjustment and management of automatic implantable cardiac defibrillator: Secondary | ICD-10-CM | POA: Diagnosis not present

## 2024-04-27 NOTE — Progress Notes (Deleted)
 BH MD/PA/NP OP Progress Note  04/27/2024 4:18 PM Brian Williams  MRN:  [id number]  Visit Diagnosis: No diagnosis found.  Assessment: Brian Williams is a 63 y.o. male with a history of GAD, OSA, Congestive dilated cardiomyopathy, CHF, CAD, HTN, DMII, hyperlipemia and obesity  who presented to [facility] at [facility] for initial evaluation on 03/11/2024.    At initial evaluation patient reported symptoms of insomnia ongoing since August 2024.  Patient denied difficulty with this prior to that date.  He described difficulty with sleep initiation and sleep maintenance.  Reportedly sleeping an average of 3.5 to 4 hours a night which is broken up into 1 hour increments at initial evaluation.  Patient endorsed fatigue and difficulty with concentration following day.  He will occasionally take a 30 to 45-minute nap.  Patient has a history of GAD and MDD however on initial evaluation denied symptoms consistent with these prior diagnoses.  He endorsed low mood, fatigue, poor sleep, difficulty with concentration which all could be secondary to his insomnia.  Furthermore patient does have a history of sleep apnea and just restarted his CPAP machine 1 week ago.  He is still working up to wearing the mask consistently.  Given presentation patient met criteria for insomnia as well as OSA.  Most appropriate treatment option would be CBT for insomnia.  That said this was reviewed and patient declined to move forward with it.    He was more interested in medication options.  He has tried multiple medication options without success and we did review how medications may have limited benefit for insomnia and are not frequently a long-term solution.  Patient expressed understanding.  We will start ramelteon  Belsomra  risk and benefits of both medications were reviewed.  Brian Williams presents for follow-up evaluation. Today, 04/27/24, patient reports ***  Risk Assessment: An assessment of suicide and  violence risk factors was performed as part of this evaluation and is not significantly changed from the last visit. While future psychiatric events cannot be accurately predicted, the patient does not currently require acute inpatient psychiatric care and does not currently meet Wheeler  involuntary commitment criteria. Patient was given contact information for crisis resources, behavioral health clinic and was instructed to call 911 for emergencies.   Plan: # OSA/insomnia Past medication trials: Mirtazapine , duloxetine , lexapro , sertraline , trazodone , amitriptyline , Xanax , gabapentin , Pregablin Status of problem: Ongoing Interventions: -- Continue with CPAP - Continue to follow with sleep medicine - Start Belsomra  5 mg at bedtime - Start ramelteon  8 mg at bedtime  - Continue to work on sleep hygiene - Recommend CBT for insomnia, patient declined at this time  # Chronic knee pain Past medication trials:  Status of problem: Ongoing Interventions: -Hydrocodone -Acetaminophen  5-325 mg daily as needed managed by his orthopedic surgeon   Chief Complaint: No chief complaint on file.  HPI: ***   Past Psychiatric History:  Past psychiatric diagnoses: GAD and MDD Psychiatric hospitalizations:Denies Past suicide attempts: Denies Hx of self harm: Denies Hx of violence towards others: Denies Prior psychiatric providers: Denies Prior therapy: denies Access to firearms: Denies  Past Medical History:  Past Medical History:  Diagnosis Date   Acute appendicitis 04/03/2021   Acute pyelonephritis 09/07/2021   AICD (automatic cardioverter/defibrillator) present    Allergy    Anxiety    takes Xanax  as needed   Appendicitis    Arthritis    back,knees,right shoulder   Back pain    Bacteremia due to Klebsiella pneumoniae 09/07/2021  Cardiomyopathy (HCC)    CHF (congestive heart failure) (HCC)    takes Lasix  and Aldactone  daily   Coronary artery disease    takes Plavix  daily    Depression    takes Zoloft  daily   Diabetes mellitus without complication (HCC)    Humulin R  and Farxiga  daily.Fasting blood sugar runs 140   GERD (gastroesophageal reflux disease)    Headache    History of bronchitis    History of colon polyps    benign   History of hiatal hernia    Hyperlipidemia    takes Fenofibrate ,Crestor , and Zetia  daily   Hypertension    takes Entresto  and Coreg  daily   MI (myocardial infarction) (HCC) 2001   Multifocal pneumonia 09/07/2021   Obesity    Peripheral neuropathy    Persistent cough for 3 weeks or longer 07/19/2022   Pneumonia    history of-last time about 14 yrs ago   PONV (postoperative nausea and vomiting)    after knee surgery 25 yrs ago b/p stayed elevated for a while   Presence of permanent cardiac pacemaker    Sepsis (HCC) 09/06/2021   Shortness of breath dyspnea    with exertion   Sleep apnea    uses CPAP nightly   Ventricular tachycardia (HCC)    s/p RFCA PVCs 2013    Past Surgical History:  Procedure Laterality Date   APPENDECTOMY     BACK SURGERY     CARDIAC CATHETERIZATION     CARDIAC CATHETERIZATION Left 05/10/2016   Procedure: Left Heart Cath and Coronary Angiography;  Surgeon: Evalene JINNY Lunger, MD;  Location: [facility] INVASIVE CV LAB;  Service: Cardiovascular;  Laterality: Left;   CARDIAC DEFIBRILLATOR PLACEMENT  10/16/2007   ICD Model number 2207-36 serial number [id number]   CARDIAC ELECTROPHYSIOLOGY STUDY AND ABLATION     CHOLECYSTECTOMY  2001   COLONOSCOPY     COLONOSCOPY WITH PROPOFOL  N/A 08/28/2018   Procedure: COLONOSCOPY WITH PROPOFOL ;  Surgeon: Therisa Bi, MD;  Location: [facility] ENDOSCOPY;  Service: Gastroenterology;  Laterality: N/A;   COLONOSCOPY WITH PROPOFOL  N/A 10/06/2021   Procedure: COLONOSCOPY WITH PROPOFOL ;  Surgeon: Therisa Bi, MD;  Location: [facility] ENDOSCOPY;  Service: Gastroenterology;  Laterality: N/A;   CORONARY ANGIOPLASTY WITH STENT PLACEMENT     7 stents   CORONARY ARTERY BYPASS GRAFT N/A 05/24/2016    Procedure: CORONARY ARTERY BYPASS GRAFTING (CABG) x four , using left internal mammary artery and left leg greater saphenous vein harvested endoscopically;  Surgeon: Dorise MARLA Fellers, MD;  Location: [facility] OR;  Service: Open Heart Surgery;  Laterality: N/A;   EP IMPLANTABLE DEVICE N/A 06/16/2015   Procedure: ICD Generator Changeout;  Surgeon: Elspeth JAYSON Sage, MD;  Location: [facility] INVASIVE CV LAB;  Service: Cardiovascular;  Laterality: N/A;   ESOPHAGOGASTRODUODENOSCOPY     EYE SURGERY  Cataract 2019   INSERT / REPLACE / REMOVE PACEMAKER     JOINT REPLACEMENT  2024   KNEE SURGERY     bilateral knee    SPINE SURGERY  1991   [name] WITHOUT CARDIOVERSION N/A 05/24/2016   Procedure: TRANSESOPHAGEAL ECHOCARDIOGRAM ([name]);  Surgeon: Dorise MARLA Fellers, MD;  Location: [facility] OR;  Service: Open Heart Surgery;  Laterality: N/A;   Ulnar Nerve surgery Left 09/19/2022   VASECTOMY     Family History:  Family History  Problem Relation Age of Onset   Heart attack Mother 70   Hypertension Mother    Melanoma Mother    Arthritis Mother    Heart attack Father 69  Hypertension Father    Hypertension Maternal Uncle    Heart disease Maternal Uncle    Heart disease Maternal Grandmother    Stroke Maternal Grandmother    Diabetes Neg Hx     Social History:  Social History   Socioeconomic History   Marital status: Married    Spouse name: Anwar Sakata   Number of children: 2   Years of education: Not on file   Highest education level: Not on file  Occupational History   Not on file  Tobacco Use   Smoking status: Former    Current packs/day: 0.00    Average packs/day: 1 pack/day for 25.0 years (25.0 ttl pk-yrs)    Types: Cigarettes    Start date: 05/19/1993    Quit date: 05/19/2018    Years since quitting: 5.9   Smokeless tobacco: Never  Vaping Use   Vaping status: Never Used  Substance and Sexual Activity   Alcohol  use: No    Comment: rare   Drug use: Never   Sexual activity: Not Currently    Birth  control/protection: None  Other Topics Concern   Not on file  Social History Narrative   Married.   Moved from [place] .   Disabled.   Social Drivers of Corporate investment banker Strain: Low Risk  (12/26/2023)   Overall Financial Resource Strain (CARDIA)    Difficulty of Paying Living Expenses: Not hard at all  Food Insecurity: No Food Insecurity (12/26/2023)   Hunger Vital Sign    Worried About Running Out of Food in the Last Year: Never true    Ran Out of Food in the Last Year: Never true  Transportation Needs: No Transportation Needs (12/26/2023)   PRAPARE - Administrator, Civil Service (Medical): No    Lack of Transportation (Non-Medical): No  Physical Activity: Inactive (12/26/2023)   Exercise Vital Sign    Days of Exercise per Week: 0 days    Minutes of Exercise per Session: 0 min  Stress: No Stress Concern Present (12/26/2023)   [employer] of Occupational Health - Occupational Stress Questionnaire    Feeling of Stress : Not at all  Social Connections: Socially Isolated (12/26/2023)   Social Connection and Isolation Panel    Frequency of Communication with Friends and Family: Never    Frequency of Social Gatherings with Friends and Family: Once a week    Attends Religious Services: Never    Database administrator or Organizations: No    Attends Banker Meetings: Never    Marital Status: Married    Allergies:  Allergies  Allergen Reactions   Naproxen Other (See Comments), Anxiety and Rash    Causes hyperactivity causes hyperactivity   Morphine  And Codeine Nausea And Vomiting    Morphine  only    Current Medications: Current Outpatient Medications  Medication Sig Dispense Refill   aspirin  EC 81 MG tablet Take 81 mg by mouth daily.  90 tablet    clopidogrel  (PLAVIX ) 75 MG tablet Take 1 tablet (75 mg total) by mouth daily. 90 tablet 0   ezetimibe  (ZETIA ) 10 MG tablet Take 1 tablet (10 mg total) by mouth daily. For cholesterol. 90  tablet 0   furosemide  (LASIX ) 40 MG tablet Take 80 mg by mouth daily.     gemfibrozil  (LOPID ) 600 MG tablet TAKE 1 TABLET BY MOUTH DAILY FOR CHOLESTEROL 90 tablet 1   HYDROcodone -acetaminophen  (NORCO/VICODIN) 5-325 MG tablet Take 1 tablet by mouth as needed.  insulin  regular human CONCENTRATED (HUMULIN R ) 500 UNIT/ML injection Inject 0-25 Units into the skin daily at 12 noon.     metoprolol  succinate (TOPROL -XL) 100 MG 24 hr tablet Take 200 mg by mouth daily.     nitroGLYCERIN  (NITROSTAT ) 0.4 MG SL tablet Place 1 tablet (0.4 mg total) under the tongue every 5 (five) minutes as needed for chest pain. 25 tablet 1   omega-3 acid ethyl esters (LOVAZA ) 1 g capsule Take 1 g by mouth 2 (two) times daily.     ramelteon  (ROZEREM ) 8 MG tablet Take 1 tablet (8 mg total) by mouth at bedtime. (Patient not taking: Reported on 04/10/2024) 30 tablet 2   rosuvastatin  (CRESTOR ) 40 MG tablet Take 1 tablet (40 mg total) by mouth daily. 90 tablet 3   sacubitril -valsartan  (ENTRESTO ) 49-51 MG Take 0.5 tablets by mouth 2 (two) times daily.     Semaglutide , 2 MG/DOSE, (OZEMPIC , 2 MG/DOSE,) 8 MG/3ML SOPN Inject 2 mg into the skin every Friday.     sotalol (BETAPACE) 160 MG tablet Take 320 mg by mouth 2 (two) times daily.     spironolactone  (ALDACTONE ) 25 MG tablet Take 25 mg by mouth daily.     Suvorexant  (BELSOMRA ) 5 MG TABS Take 1 tablet (5 mg total) by mouth every evening. (Patient not taking: Reported on 04/10/2024) 30 tablet 2   No current facility-administered medications for this visit.     Psychiatric Specialty Exam: There were no vitals taken for this visit.There is no height or weight on file to calculate BMI. Review of Systems  General Appearance: {Appearance:22683}  Eye Contact:  {BHH EYE CONTACT:22684}  Speech:  {Speech:22685}  Volume:  {Volume (PAA):22686}  Mood:  {BHH MOOD:22306}  Affect:  {Affect (PAA):22687}  Thought Content: {Thought Content:22690}   Suicidal Thoughts:  {ST/HT (PAA):22692}   Homicidal Thoughts:  {ST/HT (PAA):22692}  Thought Process:  {Thought Process (PAA):22688}  Orientation:  {BHH ORIENTATION (PAA):22689}    Memory: {BHH MEMORY:22881}  Judgment:  {Judgement (PAA):22694}  Insight:  {Insight (PAA):22695}  Concentration:  {Concentration:21399}  Recall:  not formally assessed ***  Fund of Knowledge: {BHH GOOD/FAIR/POOR:22877}  Language: {BHH GOOD/FAIR/POOR:22877}  Psychomotor Activity:  {Psychomotor (PAA):22696}  Akathisia:  {BHH YES OR NO:22294}  AIMS (if indicated): {Desc; done/not:10129}  Assets:  {Assets (PAA):22698}  ADL's:  {BHH JIO'D:77709}  Cognition: {chl bhh cognition:304700322}  Sleep:  {BHH GOOD/FAIR/POOR:22877}   Metabolic Disorder Labs: Lab Results  Component Value Date   HGBA1C 6.8 04/12/2023   MPG 171 09/07/2021   MPG 148.46 12/06/2020   No results found for: PROLACTIN Lab Results  Component Value Date   CHOL 109 08/15/2022   TRIG 364.0 (H) 08/15/2022   HDL 29.80 (L) 08/15/2022   CHOLHDL 4 08/15/2022   VLDL 72.8 (H) 08/15/2022   LDLCALC 65 07/22/2020   Lab Results  Component Value Date   TSH 1.92 11/14/2022   TSH 1.89 08/23/2022    Therapeutic Level Labs: No results found for: LITHIUM No results found for: VALPROATE No results found for: CBMZ   Screenings: GAD-7    Flowsheet Row [facility] Office Visit from 12/18/2022 in [facility] [place] Office Visit from 11[facility]14/2022 in [facility] Office Visit from 07/23/2019 in [facility] Office Visit from 07/08/2017 in [facility]  Total GAD-7 Score 2 0 0 12 19   PHQ2-9    Flowsheet  Row Office Visit from 03/10/2024 in [facility] Office Visit from 12/31/2023 in [facility] Clinical Support from  12/26/2023 in [facility] Visit from 12/18/2022 in [facility] Visit from 11/14/2022 in [facility]  PHQ-2 Total Score 2 0 0 3 5  PHQ-9 Total Score 7 0 -- 10 17   Flowsheet Row Office Visit [facility] 03[facility]10/2024 [facility] Admission (Discharged) from 10/06/2021 in [facility] to Hosp-Admission (Discharged) from 09/06/2021 in [facility]  C-SSRS RISK CATEGORY No Risk No Risk No Risk    Collaboration of Care: Collaboration of Care: Medication Management AEB ***, Primary Care Provider AEB chart review, and Other provider involved in patient's care AEB cardiology and pulmonology chart review  Patient/Guardian was advised Release of Information must be obtained prior to any record release in order to collaborate their care with an outside provider. Patient/Guardian was advised if they have not already done so to contact the registration department to sign all necessary forms in order for us  to release information regarding their care.   Consent: Patient/Guardian gives verbal consent for treatment and assignment of benefits for services provided during this visit. Patient/Guardian expressed understanding and agreed to proceed.    Arvella CHRISTELLA Finder, MD 04/27/2024, 4:18 PM   Virtual Visit via Video Note  I connected with Brian Williams on 04/27/24 at  4:30 PM EDT by a video enabled telemedicine application and verified that I am speaking with the correct person using two identifiers.  Location: Patient: Home Provider: Home Office   I discussed the limitations of evaluation and management by telemedicine and the availability of in person appointments. The patient expressed understanding and agreed to proceed.   I discussed the assessment and treatment plan with the patient. The  patient was provided an opportunity to ask questions and all were answered. The patient agreed with the plan and demonstrated an understanding of the instructions.   The patient was advised to call back or seek an in-person evaluation if the symptoms worsen or if the condition fails to improve as anticipated.  I provided *** minutes of non-face-to-face time during this encounter.   Arvella CHRISTELLA Finder, MD

## 2024-04-30 ENCOUNTER — Encounter (HOSPITAL_BASED_OUTPATIENT_CLINIC_OR_DEPARTMENT_OTHER): Payer: Self-pay | Admitting: Psychiatry

## 2024-04-30 ENCOUNTER — Encounter (HOSPITAL_COMMUNITY): Payer: Self-pay

## 2024-04-30 DIAGNOSIS — Z91199 Patient's noncompliance with other medical treatment and regimen due to unspecified reason: Secondary | ICD-10-CM

## 2024-04-30 NOTE — Progress Notes (Signed)
 This encounter was created in error - please disregard.  Patient did not show up for the appointment.  Appointment reminders were sent to patient's phone and email.

## 2024-05-07 DIAGNOSIS — E118 Type 2 diabetes mellitus with unspecified complications: Secondary | ICD-10-CM | POA: Diagnosis not present

## 2024-05-08 DIAGNOSIS — Z471 Aftercare following joint replacement surgery: Secondary | ICD-10-CM | POA: Diagnosis not present

## 2024-05-08 DIAGNOSIS — I2510 Atherosclerotic heart disease of native coronary artery without angina pectoris: Secondary | ICD-10-CM | POA: Diagnosis not present

## 2024-05-08 DIAGNOSIS — Z96651 Presence of right artificial knee joint: Secondary | ICD-10-CM | POA: Diagnosis not present

## 2024-05-08 DIAGNOSIS — M76891 Other specified enthesopathies of right lower limb, excluding foot: Secondary | ICD-10-CM | POA: Diagnosis not present

## 2024-05-08 DIAGNOSIS — E1159 Type 2 diabetes mellitus with other circulatory complications: Secondary | ICD-10-CM | POA: Diagnosis not present

## 2024-05-08 DIAGNOSIS — E66811 Obesity, class 1: Secondary | ICD-10-CM | POA: Diagnosis not present

## 2024-05-12 ENCOUNTER — Ambulatory Visit: Admitting: Internal Medicine

## 2024-05-14 DIAGNOSIS — G5621 Lesion of ulnar nerve, right upper limb: Secondary | ICD-10-CM | POA: Diagnosis not present

## 2024-05-25 DIAGNOSIS — I5042 Chronic combined systolic (congestive) and diastolic (congestive) heart failure: Secondary | ICD-10-CM | POA: Diagnosis not present

## 2024-05-25 DIAGNOSIS — Z4502 Encounter for adjustment and management of automatic implantable cardiac defibrillator: Secondary | ICD-10-CM | POA: Diagnosis not present

## 2024-06-02 DIAGNOSIS — Z794 Long term (current) use of insulin: Secondary | ICD-10-CM | POA: Diagnosis not present

## 2024-06-02 DIAGNOSIS — I2510 Atherosclerotic heart disease of native coronary artery without angina pectoris: Secondary | ICD-10-CM | POA: Diagnosis not present

## 2024-06-02 DIAGNOSIS — D649 Anemia, unspecified: Secondary | ICD-10-CM | POA: Diagnosis not present

## 2024-06-02 DIAGNOSIS — E1169 Type 2 diabetes mellitus with other specified complication: Secondary | ICD-10-CM | POA: Diagnosis not present

## 2024-06-03 ENCOUNTER — Encounter: Payer: Self-pay | Admitting: Sleep Medicine

## 2024-06-03 DIAGNOSIS — G5601 Carpal tunnel syndrome, right upper limb: Secondary | ICD-10-CM | POA: Diagnosis not present

## 2024-06-06 DIAGNOSIS — E118 Type 2 diabetes mellitus with unspecified complications: Secondary | ICD-10-CM | POA: Diagnosis not present

## 2024-06-08 ENCOUNTER — Ambulatory Visit: Admitting: Sleep Medicine

## 2024-06-08 DIAGNOSIS — Z95810 Presence of automatic (implantable) cardiac defibrillator: Secondary | ICD-10-CM | POA: Diagnosis not present

## 2024-06-30 DIAGNOSIS — E118 Type 2 diabetes mellitus with unspecified complications: Secondary | ICD-10-CM | POA: Diagnosis not present

## 2024-07-07 DIAGNOSIS — G5601 Carpal tunnel syndrome, right upper limb: Secondary | ICD-10-CM | POA: Diagnosis not present

## 2024-07-07 DIAGNOSIS — E119 Type 2 diabetes mellitus without complications: Secondary | ICD-10-CM | POA: Diagnosis not present

## 2024-07-07 DIAGNOSIS — M65351 Trigger finger, right little finger: Secondary | ICD-10-CM | POA: Diagnosis not present

## 2024-07-24 DIAGNOSIS — F5101 Primary insomnia: Secondary | ICD-10-CM | POA: Diagnosis not present

## 2024-07-27 ENCOUNTER — Encounter: Payer: Self-pay | Admitting: Urology

## 2024-07-27 ENCOUNTER — Ambulatory Visit: Admitting: Urology

## 2024-07-27 VITALS — BP 123/78 | HR 73 | Ht 71.0 in | Wt 235.0 lb

## 2024-07-27 DIAGNOSIS — N433 Hydrocele, unspecified: Secondary | ICD-10-CM

## 2024-07-27 NOTE — Progress Notes (Signed)
 07/27/2024 9:02 AM   Franky CHRISTELLA Perna June 11, 1961 [id number]  Referring provider: Gretta Comer POUR, NP [address] [place],  [place] [address]  Chief Complaint  Patient presents with   Hydrocele    HPI: Brian Williams is a 63 y.o. male initially seen 04/05/2023 for a right hydrocele who initially elected observation.  He called for an appointment to discuss hydrocelectomy.  Although there has been no significant change in the hydrocele size since his last visit he states his symptoms are more of annoyance with discomfort with changes in position and sitting   PMH: Past Medical History:  Diagnosis Date   Acute appendicitis 04/03/2021   Acute pyelonephritis 09/07/2021   AICD (automatic cardioverter/defibrillator) present    Allergy    Anxiety    takes Xanax  as needed   Appendicitis    Arthritis    back,knees,right shoulder   Back pain    Bacteremia due to Klebsiella pneumoniae 09/07/2021   Cardiomyopathy (HCC)    CHF (congestive heart failure) (HCC)    takes Lasix  and Aldactone  daily   Coronary artery disease    takes Plavix  daily   Depression    takes Zoloft  daily   Diabetes mellitus without complication (HCC)    Humulin R  and Farxiga  daily.Fasting blood sugar runs 140   GERD (gastroesophageal reflux disease)    Headache    History of bronchitis    History of colon polyps    benign   History of hiatal hernia    Hyperlipidemia    takes Fenofibrate ,Crestor , and Zetia  daily   Hypertension    takes Entresto  and Coreg  daily   MI (myocardial infarction) (HCC) 2001   Multifocal pneumonia 09/07/2021   Obesity    Peripheral neuropathy    Persistent cough for 3 weeks or longer 07/19/2022   Pneumonia    history of-last time about 14 yrs ago   PONV (postoperative nausea and vomiting)    after knee surgery 25 yrs ago b/p stayed elevated for a while   Presence of permanent cardiac pacemaker    Sepsis (HCC) 09/06/2021   Shortness of breath dyspnea    with exertion    Sleep apnea    uses CPAP nightly   Ventricular tachycardia (HCC)    s/p RFCA PVCs 2013    Surgical History: Past Surgical History:  Procedure Laterality Date   APPENDECTOMY     BACK SURGERY     CARDIAC CATHETERIZATION     CARDIAC CATHETERIZATION Left 05/10/2016   Procedure: Left Heart Cath and Coronary Angiography;  Surgeon: Evalene JINNY Lunger, MD;  Location: [facility] INVASIVE CV LAB;  Service: Cardiovascular;  Laterality: Left;   CARDIAC DEFIBRILLATOR PLACEMENT  10/16/2007   ICD Model number 2207-36 serial number [id number]   CARDIAC ELECTROPHYSIOLOGY STUDY AND ABLATION     CHOLECYSTECTOMY  2001   COLONOSCOPY     COLONOSCOPY WITH PROPOFOL  N/A 08/28/2018   Procedure: COLONOSCOPY WITH PROPOFOL ;  Surgeon: Therisa Bi, MD;  Location: [facility] ENDOSCOPY;  Service: Gastroenterology;  Laterality: N/A;   COLONOSCOPY WITH PROPOFOL  N/A 10/06/2021   Procedure: COLONOSCOPY WITH PROPOFOL ;  Surgeon: Therisa Bi, MD;  Location: [facility] ENDOSCOPY;  Service: Gastroenterology;  Laterality: N/A;   CORONARY ANGIOPLASTY WITH STENT PLACEMENT     7 stents   CORONARY ARTERY BYPASS GRAFT N/A 05/24/2016   Procedure: CORONARY ARTERY BYPASS GRAFTING (CABG) x four , using left internal mammary artery and left leg greater saphenous vein harvested endoscopically;  Surgeon: Dorise POUR Fellers, MD;  Location:  [facility] OR;  Service: Open Heart Surgery;  Laterality: N/A;   EP IMPLANTABLE DEVICE N/A 06/16/2015   Procedure: ICD Generator Changeout;  Surgeon: Elspeth JAYSON Sage, MD;  Location: [facility] INVASIVE CV LAB;  Service: Cardiovascular;  Laterality: N/A;   ESOPHAGOGASTRODUODENOSCOPY     EYE SURGERY  Cataract 2019   INSERT / REPLACE / REMOVE PACEMAKER     JOINT REPLACEMENT  2024   KNEE SURGERY     bilateral knee    SPINE SURGERY  1991   [name] WITHOUT CARDIOVERSION N/A 05/24/2016   Procedure: TRANSESOPHAGEAL ECHOCARDIOGRAM ([name]);  Surgeon: Dorise MARLA Fellers, MD;  Location[facility] OR;  Service: Open Heart Surgery;  Laterality: N/A;   Ulnar Nerve  surgery Left 09/19/2022   VASECTOMY      Home Medications:  Allergies as of 07/27/2024       Reactions   Naproxen Other (See Comments), Anxiety, Rash   Causes hyperactivity causes hyperactivity   Morphine  And Codeine Nausea And Vomiting   Morphine  only        Medication List        Accurate as of July 27, 2024  9:02 AM. If you have any questions, ask your nurse or doctor.          STOP taking these medications    Belsomra  5 MG Tabs Generic drug: Suvorexant  Stopped by: Glendia JAYSON Barba   ramelteon  8 MG tablet Commonly known as: ROZEREM  Stopped by: Glendia JAYSON Barba       TAKE these medications    aspirin  EC 81 MG tablet Take 81 mg by mouth daily.   clopidogrel  75 MG tablet Commonly known as: PLAVIX  Take 1 tablet (75 mg total) by mouth daily.   ezetimibe  10 MG tablet Commonly known as: ZETIA  Take 1 tablet (10 mg total) by mouth daily. For cholesterol.   furosemide  40 MG tablet Commonly known as: LASIX  Take 80 mg by mouth daily.   gemfibrozil  600 MG tablet Commonly known as: LOPID  TAKE 1 TABLET BY MOUTH DAILY FOR CHOLESTEROL   HUMULIN R  500 UNIT/ML injection Generic drug: insulin  regular human CONCENTRATED Inject 0-25 Units into the skin daily at 12 noon.   HYDROcodone -acetaminophen  5-325 MG tablet Commonly known as: NORCO/VICODIN Take 1 tablet by mouth as needed.   metoprolol  succinate 100 MG 24 hr tablet Commonly known as: TOPROL -XL Take 200 mg by mouth daily.   nitroGLYCERIN  0.4 MG SL tablet Commonly known as: NITROSTAT  Place 1 tablet (0.4 mg total) under the tongue every 5 (five) minutes as needed for chest pain.   omega-3 acid ethyl esters 1 g capsule Commonly known as: LOVAZA  Take 1 g by mouth 2 (two) times daily.   Ozempic  (2 MG/DOSE) 8 MG/3ML Sopn Generic drug: Semaglutide  (2 MG/DOSE) Inject 2 mg into the skin every Friday.   rosuvastatin  40 MG tablet Commonly known as: CRESTOR  Take 1 tablet (40 mg total) by mouth daily.    sacubitril -valsartan  49-51 MG Commonly known as: ENTRESTO  Take 0.5 tablets by mouth 2 (two) times daily.   sotalol 160 MG tablet Commonly known as: BETAPACE Take 320 mg by mouth 2 (two) times daily.   spironolactone  25 MG tablet Commonly known as: ALDACTONE  Take 25 mg by mouth daily.        Allergies:  Allergies  Allergen Reactions   Naproxen Other (See Comments), Anxiety and Rash    Causes hyperactivity causes hyperactivity   Morphine  And Codeine Nausea And Vomiting    Morphine  only    Family History: Family  History  Problem Relation Age of Onset   Heart attack Mother 56   Hypertension Mother    Melanoma Mother    Arthritis Mother    Heart attack Father 67   Hypertension Father    Hypertension Maternal Uncle    Heart disease Maternal Uncle    Heart disease Maternal Grandmother    Stroke Maternal Grandmother    Diabetes Neg Hx     Social History:  reports that he quit smoking about 6 years ago. His smoking use included cigarettes. He started smoking about 31 years ago. He has a 25 pack-year smoking history. He has never used smokeless tobacco. He reports that he does not drink alcohol  and does not use drugs.   Physical Exam: BP 123/78   Pulse 73   Ht 5' 11 (1.803 m)   Wt 235 lb (106.6 kg)   BMI 32.78 kg/m   Constitutional:  Alert, No acute distress. HEENT: [place] AT Respiratory: Normal respiratory effort, no increased work of breathing. GU: Large right hydrocele Psychiatric: Normal mood and affect.   Assessment & Plan:    1.  Right hydrocele Due to bothersome symptoms he desires to schedule hydrocelectomy The procedure was discussed in detail including potential complications of bleeding/hematoma and infection/abscess either which could require repeat surgery.  Injury to testis/cord structures would be unlikely.  We discussed the likelihood of significant postoperative scrotal swelling.  The need for a scrotal drain postoperatively was also discussed. He  does have significant past medical history and states his appendectomy could not be performed at [facility] but states he was told by his PCP hydrocelectomy could most likely could be performed locally   Glendia JAYSON Barba, MD  [facility] [address], [address] [place], [place] [address] [phone]

## 2024-07-28 ENCOUNTER — Other Ambulatory Visit: Payer: Self-pay

## 2024-07-28 DIAGNOSIS — N433 Hydrocele, unspecified: Secondary | ICD-10-CM

## 2024-07-28 NOTE — Progress Notes (Signed)
 Surgical Physician Order Form [facility]  Dr. Glendia Barba, MD  * Scheduling expectation : Next Available  *Length of Case: 45 min  *Clearance needed: yes  *Anticoagulation Instructions: N/A  *Aspirin  Instructions: Hold Aspirin  and Plavix   *Post-op visit Date/Instructions:  1-3 day drain removal  *Diagnosis: Hydrocele  *Procedure: right Hydrocele excision groin/unilateral scrotal approach ([id number])   Additional orders: N/A  -Admit type: OUTpatient  -Anesthesia: Choice  -VTE Prophylaxis Standing Order SCD's       Other:   -Standing Lab Orders Per Anesthesia    Lab other: None  -Standing Test orders EKG/Chest x-ray per Anesthesia       Test other:   - Medications:  Ancef  2gm IV  -Other orders:  N/A

## 2024-07-30 DIAGNOSIS — E118 Type 2 diabetes mellitus with unspecified complications: Secondary | ICD-10-CM | POA: Diagnosis not present

## 2024-08-04 DIAGNOSIS — F5101 Primary insomnia: Secondary | ICD-10-CM | POA: Diagnosis not present

## 2024-08-05 NOTE — Progress Notes (Signed)
 Have called patient on 07/28/24, 07/29/2024, 08/03/2024 and 08/05/2024 and also sent a mychart message on 08/03/2024 which was read. I am removing from surgery scheduling inbasket. Patient can call to schedule surgery when convenient for him.

## 2024-08-14 DIAGNOSIS — F5101 Primary insomnia: Secondary | ICD-10-CM | POA: Diagnosis not present

## 2024-08-18 ENCOUNTER — Telehealth: Payer: Self-pay

## 2024-08-18 NOTE — Progress Notes (Signed)
  Phone Number: [phone] for Surgical Coordinator Fax Number: [phone]  REQUEST FOR SURGICAL CLEARANCE      Date: 08/18/2024  Faxed to: [facility] Cards and EP  Surgeon: Dr. Glendia Barba, MD     Date of Surgery: 09/03/2024  Operation: Right Hydrocelectomy   Anesthesia Type: Choice   Diagnosis: Right Hydrocele  Patient Requires:   Cardiac / Vascular Clearance : Yes  Reason: Would like for patient to hold Plavix  and ASA, will also need Device Clearance as well.    Risk Assessment:    Low   []       Moderate   []     High   []           This patient is optimized for surgery  YES []       NO   []    I recommend further assessment/workup prior to surgery. YES []      NO  []   Appointment scheduled for: _______________________   Further recommendations: ____________________________________     Physician Signature:__________________________________   Printed Name: ________________________________________   Date: _________________

## 2024-08-18 NOTE — Telephone Encounter (Signed)
 Per Dr. Twylla, Patient is to be scheduled for Right Hydrocelectomy   Mr. Aldredge, was contacted and possible surgical dates were discussed, Thursday December 18th, 2025 was agreed upon for surgery.   Patient was directed to call [phone] between 1-3pm the day before surgery to find out surgical arrival time.  Instructions were given not to eat or drink from midnight on the night before surgery and have a driver for the day of surgery. On the surgery day patient was instructed to enter through the [facility] entrance of [facility] report the Same Day Surgery desk.   Pre-Admit Testing will be in contact via phone to set up an interview with the anesthesia team to review your history and medications prior to surgery.   Reminder of this information was sent via MyChart to the patient.

## 2024-08-18 NOTE — Progress Notes (Signed)
 Device Clearance   Planned Procedure: Right Hydrocelectomy   Surgeon: Dr. Twylla, MD Requesting device clearance: Eleanor Silversmith, CMA Date of Procedure: 09/03/2024 Cautery will be used.   Please fax back clearance to [phone] Attn: Eleanor

## 2024-08-18 NOTE — Progress Notes (Signed)
   [facility] Surgical Posting Form  Surgery Date: Date: 09/03/2024  Surgeon: Dr. Glendia Barba, MD  Inpt ( No  )   Outpt (Yes)   Obs ( No  )   Diagnosis: N43.3 Right Hydrocele  -CPT: [phone]  Surgery: Right Hydrocelectomy  Stop Anticoagulations: Yes, patient is currently on Plavix  and ASA  Cardiac/Medical/Pulmonary Clearance needed: yes  Clearance needed from Dr: UNK Cards  Clearance request sent on: Date: 08/18/24  *Orders entered into EPIC  Date: 08/18/24   *Case booked in EPIC  Date: 08/18/24  *Notified pt of Surgery: Date: 08/18/24  PRE-OP UA & CX: no  *Placed into Prior Authorization Work [place] Date: 08/18/24  Assistant/laser/rep:No

## 2024-08-18 NOTE — Addendum Note (Signed)
 Addended by: RUTHER SETTER A on: 08/18/2024 02:31 PM   Modules accepted: Orders

## 2024-08-20 ENCOUNTER — Telehealth: Payer: Self-pay

## 2024-08-20 NOTE — Telephone Encounter (Signed)
 Received surgical clearance from [facility]. Patient may hold Plavix  5 days prior to surgery. Patient is aware and verbalized understanding. Patient states that he is about to have all of his teeth pulled on 09/01/24. Reports that he will need to postpone urological surgery to 10/15/24- We have rescheduled him to this date.

## 2024-08-27 ENCOUNTER — Inpatient Hospital Stay: Admission: RE | Admit: 2024-08-27

## 2024-09-02 DIAGNOSIS — F5101 Primary insomnia: Secondary | ICD-10-CM | POA: Diagnosis not present

## 2024-09-07 ENCOUNTER — Encounter: Admitting: Urology

## 2024-09-07 DIAGNOSIS — K080 Exfoliation of teeth due to systemic causes: Secondary | ICD-10-CM | POA: Diagnosis not present

## 2024-09-21 ENCOUNTER — Encounter: Payer: Self-pay | Admitting: Urology

## 2024-10-05 ENCOUNTER — Inpatient Hospital Stay: Admission: RE | Admit: 2024-10-05 | Source: Ambulatory Visit

## 2024-10-12 ENCOUNTER — Encounter: Admitting: Urology

## 2024-10-15 ENCOUNTER — Ambulatory Visit: Admit: 2024-10-15 | Admitting: Urology

## 2024-10-15 ENCOUNTER — Ambulatory Visit (INDEPENDENT_AMBULATORY_CARE_PROVIDER_SITE_OTHER): Admitting: Primary Care

## 2024-10-15 VITALS — BP 102/58 | HR 65 | Temp 97.6°F | Ht 71.0 in | Wt 246.6 lb

## 2024-10-15 DIAGNOSIS — M1712 Unilateral primary osteoarthritis, left knee: Secondary | ICD-10-CM | POA: Diagnosis not present

## 2024-10-15 SURGERY — HYDROCELECTOMY
Anesthesia: Choice | Laterality: Right

## 2024-10-15 NOTE — Assessment & Plan Note (Addendum)
 After walking into the room he immediately states I'm in a bad mood.  Attempted to discuss options for pain management, discussed that I typically do not prescribe narcotics for chronic use.  He immediately becomes verbally loud, rude, and aggressive in his tone of voice.   Unable to discuss other options for treatment due to his mood and behavior.  He refuses pain management or looking into other options.  I never disagreed to prescribe his Norco, but he never gave me the option to discuss.   He abruptly stated don't worry about it and left the exam room.  Because of this behavior, I will not prescribe narcotics. Will discuss today's behavior with practice administration.  Also, note, he was 10 minutes late for his appointment.  This is also not the first time he has been verbally rude and aggressive with his tone.

## 2024-10-15 NOTE — Progress Notes (Signed)
 "  Subjective:    Patient ID: Brian Williams, male    DOB: April 20, 1961, 64 y.o.   MRN: [id number]  Brian Williams is a very pleasant 64 y.o. male with a significant medical history including congestive cardiomyopathy, CAD, CHF, hypertension, ventricular tachycardia, sleep apnea, type 2 diabetes, lumbar osteoarthritis, knee osteoarthritis, lower extremity pain who presents today to discuss chronic pain.   Currently following with orthopedic surgery, Dr. Kip for primary osteoarthritis of the left knee. He has undergone multiple injections without improvement. Dr. Kip has been prescribing Norco 5-325 mg which helps with osteoarthritis, neuropathy, and chronic back pain. He takes 1 tablet by mouth once or twice daily. He was told to come to his PCP to take over the prescription.   He needs left knee replacement but he's not ready. He is eating 8 Tylenol  and 8 Ibuprofen daily while taking hydrocodone . He's been referred to pain management, but didn't agree with their treatment regimen. He's not interested in returning.   He struggles with pain to his entire body, including chronic neuropathy. I'm in a bad mood today.    Review of Systems  Musculoskeletal:  Positive for arthralgias and back pain.  Neurological:  Positive for numbness.         Past Medical History:  Diagnosis Date   Acute appendicitis 04/03/2021   Acute pyelonephritis 09/07/2021   AICD (automatic cardioverter/defibrillator) present    Allergy    Anxiety    takes Xanax  as needed   Appendicitis    Arthritis    back,knees,right shoulder   Back pain    Bacteremia due to Klebsiella pneumoniae 09/07/2021   Cardiomyopathy (HCC)    CHF (congestive heart failure) (HCC)    takes Lasix  and Aldactone  daily   Coronary artery disease    takes Plavix  daily   Depression    takes Zoloft  daily   Diabetes mellitus without complication (HCC)    Humulin R  and Farxiga  daily.Fasting blood sugar runs 140   GERD (gastroesophageal  reflux disease)    Headache    History of bronchitis    History of colon polyps    benign   History of hiatal hernia    Hyperlipidemia    takes Fenofibrate ,Crestor , and Zetia  daily   Hypertension    takes Entresto  and Coreg  daily   MI (myocardial infarction) (HCC) 2001   Multifocal pneumonia 09/07/2021   Obesity    Peripheral neuropathy    Persistent cough for 3 weeks or longer 07/19/2022   Pneumonia    history of-last time about 14 yrs ago   PONV (postoperative nausea and vomiting)    after knee surgery 25 yrs ago b/p stayed elevated for a while   Presence of permanent cardiac pacemaker    Sepsis (HCC) 09/06/2021   Shortness of breath dyspnea    with exertion   Sleep apnea    uses CPAP nightly   Ventricular tachycardia (HCC)    s/p RFCA PVCs 2013    Social History   Socioeconomic History   Marital status: Married    Spouse name: Jaz Mallick   Number of children: 2   Years of education: Not on file   Highest education level: Not on file  Occupational History   Not on file  Tobacco Use   Smoking status: Former    Current packs/day: 0.00    Average packs/day: 1 pack/day for 25.0 years (25.0 ttl pk-yrs)    Types: Cigarettes    Start date: 05/19/1993  Quit date: 05/19/2018    Years since quitting: 6.4   Smokeless tobacco: Never  Vaping Use   Vaping status: Never Used  Substance and Sexual Activity   Alcohol  use: No    Comment: rare   Drug use: Never   Sexual activity: Not Currently    Birth control/protection: None  Other Topics Concern   Not on file  Social History Narrative   Married.   Moved from [place] .   Disabled.   Social Drivers of Health   Tobacco Use: Medium Risk (09/22/2024)   Received from [facility]   Patient History    Smoking Tobacco Use: Former    Smokeless Tobacco Use: Never    Passive Exposure: Not on file  Financial Resource Strain: Low Risk (12/26/2023)   Overall Financial Resource Strain (CARDIA)    Difficulty of Paying  Living Expenses: Not hard at all  Food Insecurity: No Food Insecurity (12/26/2023)   Hunger Vital Sign    Worried About Running Out of Food in the Last Year: Never true    Ran Out of Food in the Last Year: Never true  Transportation Needs: No Transportation Needs (12/26/2023)   PRAPARE - Administrator, Civil Service (Medical): No    Lack of Transportation (Non-Medical): No  Physical Activity: Inactive (12/26/2023)   Exercise Vital Sign    Days of Exercise per Week: 0 days    Minutes of Exercise per Session: 0 min  Stress: No Stress Concern Present (12/26/2023)   [employer] of Occupational Health - Occupational Stress Questionnaire    Feeling of Stress : Not at all  Social Connections: Socially Isolated (12/26/2023)   Social Connection and Isolation Panel    Frequency of Communication with Friends and Family: Never    Frequency of Social Gatherings with Friends and Family: Once a week    Attends Religious Services: Never    Database Administrator or Organizations: No    Attends Banker Meetings: Never    Marital Status: Married  Catering Manager Violence: Not At Risk (09/22/2024)   [facility]   Epic    Within the last year, have you been afraid of your partner or ex-partner?: No    Within the last year, have you been humiliated or emotionally abused in other ways by your partner or ex-partner?: No    Within the last year, have you been kicked, hit, slapped, or otherwise physically hurt by your partner or ex-partner?: No    Within the last year, have you been raped or forced to have any kind of sexual activity by your partner or ex-partner?: No  Depression (PHQ2-9): High Risk (10/15/2024)   Depression (PHQ2-9)    PHQ-2 Score: 11  Alcohol  Screen: Low Risk (12/26/2023)   Alcohol  Screen    Last Alcohol  Screening Score (AUDIT): 0  Housing: Unknown (12/26/2023)   Housing Stability Vital Sign    Unable to Pay for Housing in the Last Year: No     Number of Times Moved in the Last Year: Not on file    Homeless in the Last Year: No  Utilities: Not At Risk (12/26/2023)   [employer] Utilities    Threatened with loss of utilities: No  Health Literacy: Adequate Health Literacy (12/26/2023)   B1300 Health Literacy    Frequency of need for help with medical instructions: Never    Past Surgical History:  Procedure Laterality Date   APPENDECTOMY     BACK SURGERY  CARDIAC CATHETERIZATION     CARDIAC CATHETERIZATION Left 05/10/2016   Procedure: Left Heart Cath and Coronary Angiography;  Surgeon: Evalene JINNY Lunger, MD;  Location: [facility] INVASIVE CV LAB;  Service: Cardiovascular;  Laterality: Left;   CARDIAC DEFIBRILLATOR PLACEMENT  10/16/2007   ICD Model number 2207-36 serial number [id number]   CARDIAC ELECTROPHYSIOLOGY STUDY AND ABLATION     CHOLECYSTECTOMY  2001   COLONOSCOPY     COLONOSCOPY WITH PROPOFOL  N/A 08/28/2018   Procedure: COLONOSCOPY WITH PROPOFOL ;  Surgeon: Therisa Bi, MD;  Location: [facility] ENDOSCOPY;  Service: Gastroenterology;  Laterality: N/A;   COLONOSCOPY WITH PROPOFOL  N/A 10/06/2021   Procedure: COLONOSCOPY WITH PROPOFOL ;  Surgeon: Therisa Bi, MD;  Location: [facility] ENDOSCOPY;  Service: Gastroenterology;  Laterality: N/A;   CORONARY ANGIOPLASTY WITH STENT PLACEMENT     7 stents   CORONARY ARTERY BYPASS GRAFT N/A 05/24/2016   Procedure: CORONARY ARTERY BYPASS GRAFTING (CABG) x four , using left internal mammary artery and left leg greater saphenous vein harvested endoscopically;  Surgeon: Dorise MARLA Fellers, MD;  Location: [facility] OR;  Service: Open Heart Surgery;  Laterality: N/A;   EP IMPLANTABLE DEVICE N/A 06/16/2015   Procedure: ICD Generator Changeout;  Surgeon: Elspeth JAYSON Sage, MD;  Location: [facility] INVASIVE CV LAB;  Service: Cardiovascular;  Laterality: N/A;   ESOPHAGOGASTRODUODENOSCOPY     EYE SURGERY  Cataract 2019   INSERT / REPLACE / REMOVE PACEMAKER     JOINT REPLACEMENT  2024   KNEE SURGERY     bilateral knee    SPINE SURGERY   1991   [name] WITHOUT CARDIOVERSION N/A 05/24/2016   Procedure: TRANSESOPHAGEAL ECHOCARDIOGRAM ([name]);  Surgeon: Dorise MARLA Fellers, MD;  Location: [facility] OR;  Service: Open Heart Surgery;  Laterality: N/A;   Ulnar Nerve surgery Left 09/19/2022   VASECTOMY      Family History  Problem Relation Age of Onset   Heart attack Mother 5   Hypertension Mother    Melanoma Mother    Arthritis Mother    Heart attack Father 5   Hypertension Father    Hypertension Maternal Uncle    Heart disease Maternal Uncle    Heart disease Maternal Grandmother    Stroke Maternal Grandmother    Diabetes Neg Hx     Allergies[1]  Medications Ordered Prior to Encounter[2]  BP (!) 102/58   Pulse 65   Temp 97.6 F (36.4 C) (Oral)   Ht 5' 11 (1.803 m)   Wt 246 lb 9.6 oz (111.9 kg)   SpO2 97%   BMI 34.39 kg/m  Objective:   Physical Exam Pulmonary:     Effort: Pulmonary effort is normal.  Skin:    General: Skin is warm and dry.  Neurological:     Mental Status: He is alert.  Psychiatric:     Comments: Speaking loudly, stating several times I'm in a bad mood, verbally rude and offensive.      Physical Exam        Assessment & Plan:  Primary osteoarthritis of left knee Assessment & Plan: After walking into the room he immediately states I'm in a bad mood.  Attempted to discuss options for pain management, discussed that I typically do not prescribe narcotics for chronic use.  He immediately becomes verbally loud, rude, and aggressive in his tone of voice.   Unable to discuss other options for treatment due to his mood and behavior.  He refuses pain management or looking into other options.  I never disagreed to prescribe his  Norco, but he never gave me the option to discuss.   He abruptly stated don't worry about it and left the exam room.  Because of this behavior, I will not prescribe narcotics. Will discuss today's behavior with practice administration.  Also, note, he was 10 minutes  late for his appointment.  This is also not the first time he has been verbally rude and aggressive with his tone.      Assessment and Plan Assessment & Plan         Comer MARLA Gaskins, NP       [1]  Allergies Allergen Reactions   Naproxen Other (See Comments), Anxiety and Rash    Causes hyperactivity causes hyperactivity   Morphine  And Codeine Nausea And Vomiting    Morphine  only  [2]  Current Outpatient Medications on File Prior to Visit  Medication Sig Dispense Refill   aspirin  EC 81 MG tablet Take 81 mg by mouth daily.  90 tablet    clopidogrel  (PLAVIX ) 75 MG tablet Take 1 tablet (75 mg total) by mouth daily. 90 tablet 0   ezetimibe  (ZETIA ) 10 MG tablet Take 1 tablet (10 mg total) by mouth daily. For cholesterol. 90 tablet 0   furosemide  (LASIX ) 40 MG tablet Take 80 mg by mouth daily.     gemfibrozil  (LOPID ) 600 MG tablet TAKE 1 TABLET BY MOUTH DAILY FOR CHOLESTEROL 90 tablet 1   HYDROcodone -acetaminophen  (NORCO/VICODIN) 5-325 MG tablet Take 1 tablet by mouth as needed.     insulin  regular human CONCENTRATED (HUMULIN R ) 500 UNIT/ML injection Inject 0-25 Units into the skin daily at 12 noon.     metoprolol  succinate (TOPROL -XL) 100 MG 24 hr tablet Take 200 mg by mouth daily.     nitroGLYCERIN  (NITROSTAT ) 0.4 MG SL tablet Place 1 tablet (0.4 mg total) under the tongue every 5 (five) minutes as needed for chest pain. 25 tablet 1   omega-3 acid ethyl esters (LOVAZA ) 1 g capsule Take 1 g by mouth 2 (two) times daily.     rosuvastatin  (CRESTOR ) 40 MG tablet Take 1 tablet (40 mg total) by mouth daily. 90 tablet 3   sacubitril -valsartan  (ENTRESTO ) 49-51 MG Take 0.5 tablets by mouth 2 (two) times daily.     Semaglutide , 2 MG/DOSE, (OZEMPIC , 2 MG/DOSE,) 8 MG/3ML SOPN Inject 2 mg into the skin every Friday.     sotalol (BETAPACE) 160 MG tablet Take 320 mg by mouth 2 (two) times daily.     spironolactone  (ALDACTONE ) 25 MG tablet Take 25 mg by mouth daily.     No current  facility-administered medications on file prior to visit.   "

## 2024-10-19 ENCOUNTER — Encounter: Admitting: Urology

## 2024-12-28 ENCOUNTER — Ambulatory Visit

## 2024-12-31 ENCOUNTER — Encounter: Admitting: Primary Care
